# Patient Record
Sex: Female | Born: 1945
Health system: Southern US, Community
[De-identification: ages and names within clinical notes are randomized; demographics above are authoritative.]

## PROBLEM LIST (undated history)

## (undated) DIAGNOSIS — E669 Obesity, unspecified: Secondary | ICD-10-CM

## (undated) DIAGNOSIS — IMO0002 Reserved for concepts with insufficient information to code with codable children: Secondary | ICD-10-CM

## (undated) DIAGNOSIS — R7309 Other abnormal glucose: Secondary | ICD-10-CM

## (undated) DIAGNOSIS — F418 Other specified anxiety disorders: Secondary | ICD-10-CM

## (undated) DIAGNOSIS — I10 Essential (primary) hypertension: Secondary | ICD-10-CM

## (undated) DIAGNOSIS — N390 Urinary tract infection, site not specified: Secondary | ICD-10-CM

## (undated) DIAGNOSIS — R42 Dizziness and giddiness: Secondary | ICD-10-CM

## (undated) DIAGNOSIS — A809 Acute poliomyelitis, unspecified: Secondary | ICD-10-CM

## (undated) DIAGNOSIS — R3915 Urgency of urination: Secondary | ICD-10-CM

## (undated) DIAGNOSIS — Z923 Personal history of irradiation: Secondary | ICD-10-CM

## (undated) DIAGNOSIS — M19049 Primary osteoarthritis, unspecified hand: Secondary | ICD-10-CM

## (undated) DIAGNOSIS — H9190 Unspecified hearing loss, unspecified ear: Secondary | ICD-10-CM

## (undated) DIAGNOSIS — L259 Unspecified contact dermatitis, unspecified cause: Secondary | ICD-10-CM

## (undated) DIAGNOSIS — I350 Nonrheumatic aortic (valve) stenosis: Principal | ICD-10-CM

## (undated) DIAGNOSIS — Z860100 Personal history of colon polyps, unspecified: Secondary | ICD-10-CM

## (undated) DIAGNOSIS — K219 Gastro-esophageal reflux disease without esophagitis: Secondary | ICD-10-CM

## (undated) DIAGNOSIS — Z952 Presence of prosthetic heart valve: Secondary | ICD-10-CM

## (undated) DIAGNOSIS — R609 Edema, unspecified: Secondary | ICD-10-CM

## (undated) DIAGNOSIS — J019 Acute sinusitis, unspecified: Secondary | ICD-10-CM

## (undated) DIAGNOSIS — F329 Major depressive disorder, single episode, unspecified: Secondary | ICD-10-CM

## (undated) DIAGNOSIS — K5901 Slow transit constipation: Secondary | ICD-10-CM

## (undated) DIAGNOSIS — M797 Fibromyalgia: Secondary | ICD-10-CM

## (undated) DIAGNOSIS — K08109 Complete loss of teeth, unspecified cause, unspecified class: Secondary | ICD-10-CM

## (undated) DIAGNOSIS — J309 Allergic rhinitis, unspecified: Secondary | ICD-10-CM

## (undated) DIAGNOSIS — K589 Irritable bowel syndrome without diarrhea: Secondary | ICD-10-CM

## (undated) DIAGNOSIS — S2000XA Contusion of breast, unspecified breast, initial encounter: Secondary | ICD-10-CM

## (undated) DIAGNOSIS — M8580 Other specified disorders of bone density and structure, unspecified site: Secondary | ICD-10-CM

## (undated) DIAGNOSIS — E876 Hypokalemia: Secondary | ICD-10-CM

## (undated) DIAGNOSIS — R011 Cardiac murmur, unspecified: Secondary | ICD-10-CM

## (undated) DIAGNOSIS — C541 Malignant neoplasm of endometrium: Secondary | ICD-10-CM

## (undated) DIAGNOSIS — N318 Other neuromuscular dysfunction of bladder: Secondary | ICD-10-CM

## (undated) DIAGNOSIS — C4490 Unspecified malignant neoplasm of skin, unspecified: Secondary | ICD-10-CM

## (undated) DIAGNOSIS — Z974 Presence of external hearing-aid: Secondary | ICD-10-CM

## (undated) DIAGNOSIS — M549 Dorsalgia, unspecified: Secondary | ICD-10-CM

## (undated) DIAGNOSIS — Z8371 Family history of colonic polyps: Secondary | ICD-10-CM

## (undated) DIAGNOSIS — Z800 Family history of malignant neoplasm of digestive organs: Secondary | ICD-10-CM

## (undated) DIAGNOSIS — R300 Dysuria: Secondary | ICD-10-CM

## (undated) DIAGNOSIS — M1990 Unspecified osteoarthritis, unspecified site: Secondary | ICD-10-CM

## (undated) DIAGNOSIS — R2689 Other abnormalities of gait and mobility: Secondary | ICD-10-CM

## (undated) DIAGNOSIS — L219 Seborrheic dermatitis, unspecified: Secondary | ICD-10-CM

## (undated) DIAGNOSIS — Z972 Presence of dental prosthetic device (complete) (partial): Secondary | ICD-10-CM

## (undated) DIAGNOSIS — E785 Hyperlipidemia, unspecified: Secondary | ICD-10-CM

## (undated) DIAGNOSIS — K6389 Other specified diseases of intestine: Secondary | ICD-10-CM

## (undated) DIAGNOSIS — R32 Unspecified urinary incontinence: Secondary | ICD-10-CM

## (undated) DIAGNOSIS — L299 Pruritus, unspecified: Secondary | ICD-10-CM

## (undated) DIAGNOSIS — Z8612 Personal history of poliomyelitis: Secondary | ICD-10-CM

## (undated) DIAGNOSIS — Z85828 Personal history of other malignant neoplasm of skin: Secondary | ICD-10-CM

## (undated) HISTORY — DX: Family history of malignant neoplasm of digestive organs: Z80.0

## (undated) HISTORY — DX: Family history of colonic polyps: Z83.71

## (undated) HISTORY — PX: UPPER GI ENDOSCOPY: SHX6162

## (undated) HISTORY — DX: Irritable bowel syndrome, unspecified: K58.9

## (undated) HISTORY — DX: Major depressive disorder, single episode, unspecified: F32.9

## (undated) HISTORY — PX: WISDOM TOOTH EXTRACTION: SHX21

## (undated) HISTORY — PX: TUBAL LIGATION: SHX77

## (undated) HISTORY — DX: Essential (primary) hypertension: I10

## (undated) HISTORY — PX: MULTIPLE TOOTH EXTRACTIONS: SHX2053

## (undated) HISTORY — DX: Unspecified osteoarthritis, unspecified site: M19.90

## (undated) HISTORY — DX: Other specified disorders of bone density and structure, unspecified site: M85.80

## (undated) HISTORY — PX: TRANSTHORACIC ECHOCARDIOGRAM: SHX275

## (undated) HISTORY — PX: CARPAL TUNNEL RELEASE: SHX101

## (undated) HISTORY — PX: OTHER SURGICAL HISTORY: SHX169

## (undated) HISTORY — DX: Gastro-esophageal reflux disease without esophagitis: K21.9

## (undated) HISTORY — DX: Cardiac murmur, unspecified: R01.1

## (undated) HISTORY — PX: DILATION AND CURETTAGE OF UTERUS: SHX78

## (undated) HISTORY — DX: Urinary tract infection, site not specified: N39.0

## (undated) HISTORY — DX: Nonrheumatic aortic (valve) stenosis: I35.0

---
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
## 1974-07-09 HISTORY — PX: NASAL SINUS SURGERY: SHX719

## 1996-07-09 HISTORY — PX: KNEE SURGERY: SHX244

## 1997-07-09 HISTORY — PX: THUMB ARTHROSCOPY: SHX2509

## 1997-07-09 HISTORY — PX: HAND SURGERY: SHX662

## 1997-12-31 ENCOUNTER — Other Ambulatory Visit: Admission: RE | Admit: 1997-12-31 | Discharge: 1997-12-31 | Payer: Self-pay | Admitting: Obstetrics and Gynecology

## 1998-02-22 ENCOUNTER — Ambulatory Visit (HOSPITAL_BASED_OUTPATIENT_CLINIC_OR_DEPARTMENT_OTHER): Admission: RE | Admit: 1998-02-22 | Discharge: 1998-02-22 | Payer: Self-pay | Admitting: Orthopedic Surgery

## 1998-11-25 ENCOUNTER — Ambulatory Visit (HOSPITAL_COMMUNITY): Admission: RE | Admit: 1998-11-25 | Discharge: 1998-11-25 | Payer: Self-pay | Admitting: *Deleted

## 1998-11-25 ENCOUNTER — Encounter: Payer: Self-pay | Admitting: *Deleted

## 1999-01-23 ENCOUNTER — Other Ambulatory Visit: Admission: RE | Admit: 1999-01-23 | Discharge: 1999-01-23 | Payer: Self-pay | Admitting: Obstetrics and Gynecology

## 2000-01-23 ENCOUNTER — Other Ambulatory Visit: Admission: RE | Admit: 2000-01-23 | Discharge: 2000-01-23 | Payer: Self-pay | Admitting: Obstetrics and Gynecology

## 2000-04-12 ENCOUNTER — Encounter: Payer: Self-pay | Admitting: Obstetrics and Gynecology

## 2000-04-12 ENCOUNTER — Encounter: Admission: RE | Admit: 2000-04-12 | Discharge: 2000-04-12 | Payer: Self-pay | Admitting: Obstetrics and Gynecology

## 2000-10-01 ENCOUNTER — Encounter: Admission: RE | Admit: 2000-10-01 | Discharge: 2000-10-01 | Payer: Self-pay

## 2001-04-11 ENCOUNTER — Encounter: Admission: RE | Admit: 2001-04-11 | Discharge: 2001-04-11 | Payer: Self-pay | Admitting: Obstetrics and Gynecology

## 2001-04-11 ENCOUNTER — Encounter: Payer: Self-pay | Admitting: Obstetrics and Gynecology

## 2001-05-05 ENCOUNTER — Encounter: Admission: RE | Admit: 2001-05-05 | Discharge: 2001-05-05 | Payer: Self-pay | Admitting: *Deleted

## 2002-04-17 ENCOUNTER — Encounter: Payer: Self-pay | Admitting: Obstetrics and Gynecology

## 2002-04-17 ENCOUNTER — Encounter: Admission: RE | Admit: 2002-04-17 | Discharge: 2002-04-17 | Payer: Self-pay | Admitting: Obstetrics and Gynecology

## 2002-06-19 ENCOUNTER — Encounter: Payer: Self-pay | Admitting: Gastroenterology

## 2003-01-14 ENCOUNTER — Encounter: Payer: Self-pay | Admitting: Emergency Medicine

## 2003-01-14 ENCOUNTER — Emergency Department (HOSPITAL_COMMUNITY): Admission: EM | Admit: 2003-01-14 | Discharge: 2003-01-14 | Payer: Self-pay | Admitting: Emergency Medicine

## 2003-04-09 ENCOUNTER — Encounter: Payer: Self-pay | Admitting: Obstetrics and Gynecology

## 2003-04-09 ENCOUNTER — Encounter: Admission: RE | Admit: 2003-04-09 | Discharge: 2003-04-09 | Payer: Self-pay | Admitting: Obstetrics and Gynecology

## 2003-06-10 ENCOUNTER — Encounter: Admission: RE | Admit: 2003-06-10 | Discharge: 2003-06-10 | Payer: Self-pay | Admitting: Obstetrics and Gynecology

## 2003-07-23 ENCOUNTER — Encounter: Admission: RE | Admit: 2003-07-23 | Discharge: 2003-07-23 | Payer: Self-pay | Admitting: Obstetrics and Gynecology

## 2004-02-01 ENCOUNTER — Other Ambulatory Visit: Admission: RE | Admit: 2004-02-01 | Discharge: 2004-02-01 | Payer: Self-pay | Admitting: Obstetrics and Gynecology

## 2004-04-07 ENCOUNTER — Encounter: Admission: RE | Admit: 2004-04-07 | Discharge: 2004-04-07 | Payer: Self-pay | Admitting: Obstetrics and Gynecology

## 2004-07-06 ENCOUNTER — Ambulatory Visit: Payer: Self-pay | Admitting: Family Medicine

## 2004-07-28 ENCOUNTER — Ambulatory Visit: Payer: Self-pay | Admitting: Family Medicine

## 2004-08-18 ENCOUNTER — Encounter: Admission: RE | Admit: 2004-08-18 | Discharge: 2004-08-18 | Payer: Self-pay | Admitting: Otolaryngology

## 2004-09-01 ENCOUNTER — Ambulatory Visit: Payer: Self-pay | Admitting: Family Medicine

## 2004-10-16 ENCOUNTER — Ambulatory Visit: Payer: Self-pay | Admitting: Family Medicine

## 2004-10-19 ENCOUNTER — Ambulatory Visit: Payer: Self-pay | Admitting: Internal Medicine

## 2004-11-15 ENCOUNTER — Ambulatory Visit: Payer: Self-pay | Admitting: Family Medicine

## 2005-02-01 ENCOUNTER — Other Ambulatory Visit: Admission: RE | Admit: 2005-02-01 | Discharge: 2005-02-01 | Payer: Self-pay | Admitting: Obstetrics and Gynecology

## 2005-04-25 ENCOUNTER — Encounter: Admission: RE | Admit: 2005-04-25 | Discharge: 2005-04-25 | Payer: Self-pay | Admitting: Obstetrics and Gynecology

## 2005-05-04 ENCOUNTER — Ambulatory Visit: Payer: Self-pay | Admitting: Family Medicine

## 2005-07-09 LAB — HM DEXA SCAN

## 2005-08-24 ENCOUNTER — Ambulatory Visit: Payer: Self-pay | Admitting: Family Medicine

## 2005-09-06 ENCOUNTER — Ambulatory Visit: Payer: Self-pay | Admitting: Family Medicine

## 2005-10-24 ENCOUNTER — Ambulatory Visit: Payer: Self-pay | Admitting: Family Medicine

## 2006-03-09 ENCOUNTER — Encounter: Payer: Self-pay | Admitting: Family Medicine

## 2006-03-28 ENCOUNTER — Other Ambulatory Visit: Admission: RE | Admit: 2006-03-28 | Discharge: 2006-03-28 | Payer: Self-pay | Admitting: Obstetrics and Gynecology

## 2006-04-25 ENCOUNTER — Encounter: Admission: RE | Admit: 2006-04-25 | Discharge: 2006-04-25 | Payer: Self-pay | Admitting: Obstetrics and Gynecology

## 2006-08-30 ENCOUNTER — Ambulatory Visit: Payer: Self-pay | Admitting: Family Medicine

## 2006-09-06 ENCOUNTER — Ambulatory Visit: Payer: Self-pay | Admitting: Family Medicine

## 2006-09-06 LAB — CONVERTED CEMR LAB
ALT: 32 units/L (ref 0–40)
AST: 26 units/L (ref 0–37)
Albumin: 4.1 g/dL (ref 3.5–5.2)
Alkaline Phosphatase: 69 units/L (ref 39–117)
BUN: 12 mg/dL (ref 6–23)
Basophils Absolute: 0 10*3/uL (ref 0.0–0.1)
Calcium: 9.2 mg/dL (ref 8.4–10.5)
Chloride: 100 meq/L (ref 96–112)
Cholesterol: 178 mg/dL (ref 0–200)
Creatinine, Ser: 0.8 mg/dL (ref 0.4–1.2)
GFR calc non Af Amer: 78 mL/min
HCT: 40.9 % (ref 36.0–46.0)
LDL Cholesterol: 109 mg/dL — ABNORMAL HIGH (ref 0–99)
MCHC: 35.4 g/dL (ref 30.0–36.0)
Neutrophils Relative %: 69.1 % (ref 43.0–77.0)
Platelets: 253 10*3/uL (ref 150–400)
RBC: 4.64 M/uL (ref 3.87–5.11)
RDW: 12.7 % (ref 11.5–14.6)
Sodium: 136 meq/L (ref 135–145)
Total Bilirubin: 1 mg/dL (ref 0.3–1.2)
Total CHOL/HDL Ratio: 5
Triglycerides: 168 mg/dL — ABNORMAL HIGH (ref 0–149)

## 2006-09-17 ENCOUNTER — Ambulatory Visit: Payer: Self-pay | Admitting: Family Medicine

## 2007-03-27 ENCOUNTER — Other Ambulatory Visit: Admission: RE | Admit: 2007-03-27 | Discharge: 2007-03-27 | Payer: Self-pay | Admitting: Obstetrics and Gynecology

## 2007-04-25 ENCOUNTER — Encounter: Admission: RE | Admit: 2007-04-25 | Discharge: 2007-04-25 | Payer: Self-pay | Admitting: Obstetrics and Gynecology

## 2007-05-02 ENCOUNTER — Encounter: Payer: Self-pay | Admitting: Family Medicine

## 2007-05-05 ENCOUNTER — Encounter (INDEPENDENT_AMBULATORY_CARE_PROVIDER_SITE_OTHER): Payer: Self-pay | Admitting: *Deleted

## 2007-08-26 ENCOUNTER — Encounter: Payer: Self-pay | Admitting: Family Medicine

## 2007-08-26 DIAGNOSIS — R609 Edema, unspecified: Secondary | ICD-10-CM

## 2007-08-26 DIAGNOSIS — M19049 Primary osteoarthritis, unspecified hand: Secondary | ICD-10-CM | POA: Insufficient documentation

## 2007-08-26 DIAGNOSIS — R32 Unspecified urinary incontinence: Secondary | ICD-10-CM | POA: Insufficient documentation

## 2007-08-26 DIAGNOSIS — F32A Depression, unspecified: Secondary | ICD-10-CM

## 2007-08-26 DIAGNOSIS — Z87448 Personal history of other diseases of urinary system: Secondary | ICD-10-CM

## 2007-08-26 DIAGNOSIS — K589 Irritable bowel syndrome without diarrhea: Secondary | ICD-10-CM | POA: Insufficient documentation

## 2007-08-26 DIAGNOSIS — IMO0001 Reserved for inherently not codable concepts without codable children: Secondary | ICD-10-CM

## 2007-08-26 DIAGNOSIS — K219 Gastro-esophageal reflux disease without esophagitis: Secondary | ICD-10-CM

## 2007-08-26 DIAGNOSIS — N318 Other neuromuscular dysfunction of bladder: Secondary | ICD-10-CM

## 2007-08-26 DIAGNOSIS — J309 Allergic rhinitis, unspecified: Secondary | ICD-10-CM | POA: Insufficient documentation

## 2007-08-26 DIAGNOSIS — Z8612 Personal history of poliomyelitis: Secondary | ICD-10-CM

## 2007-08-26 DIAGNOSIS — E669 Obesity, unspecified: Secondary | ICD-10-CM

## 2007-08-26 DIAGNOSIS — R42 Dizziness and giddiness: Secondary | ICD-10-CM | POA: Insufficient documentation

## 2007-08-26 DIAGNOSIS — F329 Major depressive disorder, single episode, unspecified: Secondary | ICD-10-CM

## 2007-08-26 DIAGNOSIS — F419 Anxiety disorder, unspecified: Secondary | ICD-10-CM | POA: Insufficient documentation

## 2007-08-26 HISTORY — DX: Unspecified urinary incontinence: R32

## 2007-08-26 HISTORY — DX: Primary osteoarthritis, unspecified hand: M19.049

## 2007-08-26 HISTORY — DX: Other neuromuscular dysfunction of bladder: N31.8

## 2007-08-26 HISTORY — DX: Gastro-esophageal reflux disease without esophagitis: K21.9

## 2007-08-26 HISTORY — DX: Obesity, unspecified: E66.9

## 2007-08-26 HISTORY — DX: Personal history of poliomyelitis: Z86.12

## 2007-08-26 HISTORY — DX: Depression, unspecified: F32.A

## 2007-08-26 HISTORY — DX: Anxiety disorder, unspecified: F41.9

## 2007-08-29 ENCOUNTER — Ambulatory Visit: Payer: Self-pay | Admitting: Family Medicine

## 2007-09-02 ENCOUNTER — Telehealth: Payer: Self-pay | Admitting: Family Medicine

## 2007-09-03 ENCOUNTER — Encounter: Admission: RE | Admit: 2007-09-03 | Discharge: 2007-09-03 | Payer: Self-pay | Admitting: Family Medicine

## 2007-09-05 ENCOUNTER — Ambulatory Visit: Payer: Self-pay | Admitting: Family Medicine

## 2007-09-05 DIAGNOSIS — M8580 Other specified disorders of bone density and structure, unspecified site: Secondary | ICD-10-CM

## 2007-09-09 LAB — CONVERTED CEMR LAB
ALT: 21 units/L (ref 0–35)
Albumin: 4 g/dL (ref 3.5–5.2)
Alkaline Phosphatase: 59 units/L (ref 39–117)
BUN: 7 mg/dL (ref 6–23)
Basophils Absolute: 0 10*3/uL (ref 0.0–0.1)
Basophils Relative: 0.1 % (ref 0.0–1.0)
CO2: 31 meq/L (ref 19–32)
Calcium: 9.3 mg/dL (ref 8.4–10.5)
Cholesterol: 156 mg/dL (ref 0–200)
Creatinine, Ser: 0.9 mg/dL (ref 0.4–1.2)
GFR calc Af Amer: 82 mL/min
HDL: 32.6 mg/dL — ABNORMAL LOW (ref >39.0)
LDL Cholesterol: 89 mg/dL (ref 0–99)
MCHC: 33.7 g/dL (ref 30.0–36.0)
Monocytes Relative: 3.7 % (ref 3.0–11.0)
Neutro Abs: 3.2 10*3/uL (ref 1.4–7.7)
Platelets: 215 10*3/uL (ref 150–400)
Potassium: 3.6 meq/L (ref 3.5–5.1)
RDW: 12.1 % (ref 11.5–14.6)
Total Protein: 6.7 g/dL (ref 6.0–8.3)
Triglycerides: 172 mg/dL — ABNORMAL HIGH (ref 0–149)
VLDL: 34 mg/dL (ref 0–40)

## 2007-09-10 LAB — CONVERTED CEMR LAB: Vit D, 1,25-Dihydroxy: 40 (ref 30–89)

## 2007-09-26 ENCOUNTER — Telehealth: Payer: Self-pay | Admitting: Family Medicine

## 2008-03-25 ENCOUNTER — Other Ambulatory Visit: Admission: RE | Admit: 2008-03-25 | Discharge: 2008-03-25 | Payer: Self-pay | Admitting: Obstetrics and Gynecology

## 2008-04-23 ENCOUNTER — Encounter (INDEPENDENT_AMBULATORY_CARE_PROVIDER_SITE_OTHER): Payer: Self-pay | Admitting: *Deleted

## 2008-04-23 ENCOUNTER — Encounter: Payer: Self-pay | Admitting: Family Medicine

## 2008-04-23 ENCOUNTER — Encounter: Admission: RE | Admit: 2008-04-23 | Discharge: 2008-04-23 | Payer: Self-pay | Admitting: Obstetrics and Gynecology

## 2008-05-06 ENCOUNTER — Encounter: Payer: Self-pay | Admitting: Family Medicine

## 2008-06-14 ENCOUNTER — Encounter: Payer: Self-pay | Admitting: Family Medicine

## 2008-07-07 ENCOUNTER — Telehealth (INDEPENDENT_AMBULATORY_CARE_PROVIDER_SITE_OTHER): Payer: Self-pay | Admitting: *Deleted

## 2008-08-12 ENCOUNTER — Ambulatory Visit: Payer: Self-pay | Admitting: Family Medicine

## 2008-08-12 LAB — CONVERTED CEMR LAB
Glucose, Urine, Semiquant: NEGATIVE
Ketones, urine, test strip: NEGATIVE
Protein, U semiquant: NEGATIVE
RBC / HPF: 2
Specific Gravity, Urine: 1.025
pH: 6

## 2008-08-16 LAB — CONVERTED CEMR LAB
AST: 28 units/L (ref 0–37)
Alkaline Phosphatase: 54 units/L (ref 39–117)
Bilirubin, Direct: 0.1 mg/dL (ref 0.0–0.3)
Chloride: 109 meq/L (ref 96–112)
Eosinophils Relative: 2.8 % (ref 0.0–5.0)
GFR calc Af Amer: 109 mL/min
GFR calc non Af Amer: 90 mL/min
Glucose, Bld: 96 mg/dL (ref 70–99)
HCT: 39.8 % (ref 36.0–46.0)
HDL: 37 mg/dL — ABNORMAL LOW (ref >39.0)
LDL Cholesterol: 109 mg/dL — ABNORMAL HIGH (ref 0–99)
Lymphocytes Relative: 27.9 % (ref 12.0–46.0)
Monocytes Absolute: 0.2 10*3/uL (ref 0.1–1.0)
Monocytes Relative: 4.1 % (ref 3.0–12.0)
Neutrophils Relative %: 65.1 % (ref 43.0–77.0)
Platelets: 224 10*3/uL (ref 150–400)
Potassium: 3.9 meq/L (ref 3.5–5.1)
RDW: 12.2 % (ref 11.5–14.6)
Sodium: 141 meq/L (ref 135–145)
Total CHOL/HDL Ratio: 4.8
Total Protein: 7.1 g/dL (ref 6.0–8.3)
Triglycerides: 154 mg/dL — ABNORMAL HIGH (ref 0–149)
VLDL: 31 mg/dL (ref 0–40)
WBC: 5 10*3/uL (ref 4.5–10.5)

## 2008-09-08 ENCOUNTER — Telehealth: Payer: Self-pay | Admitting: Family Medicine

## 2008-10-27 ENCOUNTER — Encounter: Payer: Self-pay | Admitting: Family Medicine

## 2008-11-03 ENCOUNTER — Encounter: Payer: Self-pay | Admitting: Family Medicine

## 2009-03-23 ENCOUNTER — Other Ambulatory Visit: Admission: RE | Admit: 2009-03-23 | Discharge: 2009-03-23 | Payer: Self-pay | Admitting: Obstetrics and Gynecology

## 2009-04-08 ENCOUNTER — Encounter: Admission: RE | Admit: 2009-04-08 | Discharge: 2009-04-08 | Payer: Self-pay | Admitting: Obstetrics and Gynecology

## 2009-04-12 ENCOUNTER — Encounter (INDEPENDENT_AMBULATORY_CARE_PROVIDER_SITE_OTHER): Payer: Self-pay | Admitting: *Deleted

## 2009-08-19 ENCOUNTER — Encounter (INDEPENDENT_AMBULATORY_CARE_PROVIDER_SITE_OTHER): Payer: Self-pay | Admitting: *Deleted

## 2009-08-19 ENCOUNTER — Ambulatory Visit: Payer: Self-pay | Admitting: Family Medicine

## 2009-08-19 DIAGNOSIS — R300 Dysuria: Secondary | ICD-10-CM

## 2009-08-19 LAB — CONVERTED CEMR LAB
Bilirubin Urine: NEGATIVE
Glucose, Urine, Semiquant: NEGATIVE
RBC / HPF: 0
Urobilinogen, UA: 0.2
pH: 6

## 2009-08-23 DIAGNOSIS — R82998 Other abnormal findings in urine: Secondary | ICD-10-CM | POA: Insufficient documentation

## 2009-08-23 LAB — CONVERTED CEMR LAB
Alkaline Phosphatase: 71 units/L (ref 39–117)
Basophils Relative: 0.2 % (ref 0.0–3.0)
Bilirubin, Direct: 0.1 mg/dL (ref 0.0–0.3)
Calcium: 9.2 mg/dL (ref 8.4–10.5)
Creatinine, Ser: 0.8 mg/dL (ref 0.4–1.2)
Eosinophils Absolute: 0.1 10*3/uL (ref 0.0–0.7)
Eosinophils Relative: 2.6 % (ref 0.0–5.0)
GFR calc non Af Amer: 76.63 mL/min (ref >60)
HDL: 42.5 mg/dL (ref >39.00)
LDL Cholesterol: 94 mg/dL (ref 0–99)
Lymphocytes Relative: 21.1 % (ref 12.0–46.0)
MCHC: 34 g/dL (ref 30.0–36.0)
Neutrophils Relative %: 71.1 % (ref 43.0–77.0)
RBC: 4.2 M/uL (ref 3.87–5.11)
Total CHOL/HDL Ratio: 4
Total Protein: 6.9 g/dL (ref 6.0–8.3)
Triglycerides: 149 mg/dL (ref 0.0–149.0)
VLDL: 29.8 mg/dL (ref 0.0–40.0)
WBC: 4.7 10*3/uL (ref 4.5–10.5)

## 2009-09-16 ENCOUNTER — Ambulatory Visit: Payer: Self-pay | Admitting: Gastroenterology

## 2009-09-16 ENCOUNTER — Encounter (INDEPENDENT_AMBULATORY_CARE_PROVIDER_SITE_OTHER): Payer: Self-pay | Admitting: *Deleted

## 2009-09-16 LAB — CONVERTED CEMR LAB
Iron: 62 ug/dL (ref 42–145)
Tissue Transglutaminase Ab, IgA: 0.3 units (ref <7)
Transferrin: 255.7 mg/dL (ref 212.0–360.0)
Vitamin B-12: 410 pg/mL (ref 211–911)

## 2009-09-23 ENCOUNTER — Encounter: Payer: Self-pay | Admitting: Family Medicine

## 2009-09-26 ENCOUNTER — Telehealth: Payer: Self-pay | Admitting: Gastroenterology

## 2009-09-27 ENCOUNTER — Telehealth: Payer: Self-pay | Admitting: Family Medicine

## 2009-10-14 ENCOUNTER — Telehealth: Payer: Self-pay | Admitting: Gastroenterology

## 2009-10-21 ENCOUNTER — Ambulatory Visit: Payer: Self-pay | Admitting: Gastroenterology

## 2009-10-21 ENCOUNTER — Encounter (INDEPENDENT_AMBULATORY_CARE_PROVIDER_SITE_OTHER): Payer: Self-pay | Admitting: *Deleted

## 2009-10-26 ENCOUNTER — Encounter: Payer: Self-pay | Admitting: Gastroenterology

## 2009-11-01 ENCOUNTER — Telehealth: Payer: Self-pay | Admitting: Gastroenterology

## 2009-12-09 ENCOUNTER — Telehealth: Payer: Self-pay | Admitting: Family Medicine

## 2010-01-16 ENCOUNTER — Other Ambulatory Visit: Admission: RE | Admit: 2010-01-16 | Discharge: 2010-01-16 | Payer: Self-pay | Admitting: Obstetrics and Gynecology

## 2010-01-23 ENCOUNTER — Telehealth: Payer: Self-pay | Admitting: Family Medicine

## 2010-04-12 LAB — HM MAMMOGRAPHY: HM Mammogram: NORMAL

## 2010-04-19 ENCOUNTER — Encounter: Admission: RE | Admit: 2010-04-19 | Discharge: 2010-04-19 | Payer: Self-pay | Admitting: Obstetrics and Gynecology

## 2010-05-16 ENCOUNTER — Telehealth: Payer: Self-pay | Admitting: Family Medicine

## 2010-08-08 NOTE — Letter (Signed)
Summary: Patient Notice- Polyp Results  [facility]  [address] [place], [place] [address]   Phone: [phone]  Fax: [phone]        October 26, 2009 MRN: [id number]    [facility] [address] [place], [place]  [address]    Dear Veronica Greene,  I am pleased to inform you that the colon polyp(s) removed during your recent colonoscopy was (were) found to be benign (no cancer detected) upon pathologic examination.  I recommend you have a repeat colonoscopy examination in 1_ years to look for recurrent polyps, as having colon polyps increases your risk for having recurrent polyps or even colon cancer in the future.  Should you develop new or worsening symptoms of abdominal pain, bowel habit changes or bleeding from the rectum or bowels, please schedule an evaluation with either your primary care physician or with me.  Additional information/recommendations:  __ No further action with gastroenterology is needed at this time. Please      follow-up with your primary care physician for your other healthcare      needs.  __ Please call [phone] to schedule a return visit to review your      situation.  __ Please keep your follow-up visit as already scheduled.  _x_ Continue treatment plan as outlined the day of your exam.  Please call us if you are having persistent problems or have questions about your condition that have not been fully answered at this time.  Sincerely,  Veronica Layman MD [facility]  This letter has been electronically signed by your physician.  Appended Document: Patient Notice- Polyp Results letter mailed 4.25.11

## 2010-08-08 NOTE — Procedures (Signed)
Summary: Upper Endoscopy  Patient: Veronica Greene Note: All result statuses are Final unless otherwise noted.  Tests: (1) Upper Endoscopy (EGD)   EGD Upper Endoscopy       DONE     [facility]     [address]     [place], [place]  [address]           ENDOSCOPY PROCEDURE REPORT           PATIENT:  Veronica, Greene  MR#:  [id number]     BIRTHDATE:  [birth year], 63 yrs. old  GENDER:  female           ENDOSCOPIST:  Vania Rea. Jarold Motto, MD, [facility]     Referred by:           PROCEDURE DATE:  10/21/2009     PROCEDURE:  EGD with biopsy     ASA CLASS:  Class II     INDICATIONS:  dyspepsia           MEDICATIONS:   Fentanyl 25 mcg IV, Versed 3 mg IV, There was     residual sedation effect present from prior procedure.,     glycopyrrolate (Robinal) 0.2 mg IV     TOPICAL ANESTHETIC:  Exactacain Spray           DESCRIPTION OF PROCEDURE:   After the risks benefits and     alternatives of the procedure were thoroughly explained, informed     consent was obtained.  The LB GIF-H180 [id number] endoscope was     introduced through the mouth and advanced to the second portion of     the duodenum, without limitations.  The instrument was slowly     withdrawn as the mucosa was fully examined.     <<PROCEDUREIMAGES>>           Moderate gastritis was found in the body and the antrum of the     stomach. CLO AND REGULAR BIOPSIES DONE.  Normal duodenal folds     were noted. SI BX. DONE.  Normal GE junction was noted.     Retroflexed views revealed no abnormalities.    The scope was then     withdrawn from the patient and the procedure completed.           COMPLICATIONS:  None           ENDOSCOPIC IMPRESSION:     1) Moderate gastritis in the body and the antrum of the stomach           2) Normal duodenal folds     3) Normal GE junction     1.R/O H.PYLORI     2.R/O CELIAC DISEASE     3.CLINICAL GERD     RECOMMENDATIONS:     1) Await biopsy results     2) Rx CLO if positive     3) Anti-reflux  regimen to be follow     4) continue PPI           REPEAT EXAM:  No           ______________________________     Vania Rea. Jarold Motto, MD, [name]           CC:  [name], MD           n.     Rosalie DoctorVania Rea. Patterson at 10/21/2009 02:19 PM           [name], [phone]  Note: An exclamation mark Marland Kitchen)  indicates a result that was not dispersed into the flowsheet. Document Creation Date: 10/21/2009 2:20 PM _______________________________________________________________________  (1) Order result status: Final Collection or observation date-time: 10/21/2009 14:12 Requested date-time:  Receipt date-time:  Reported date-time:  Referring Physician:   Ordering Physician: Sheryn Bison [phone]) Specimen Source:  Source: Launa Grill Order Number: [id number] Lab site:   Appended Document: Upper Endoscopy     Clinical Lists Changes  Observations: Added new observation of PAST SURG HX: Sinus surgery (1976) Knee surgery x 2 (1998) Thumb surgery (1998) Carpal tunnel release x 2 Surgery for poliio as a child Tonsillectomy Stress fracture right foot (1999) Dexa- osteopenia (04/2001), no change (04/2003), improved 2007 Colonoscopy (1998) Hx of urethral dilation Carotid dopplers- neg (11/2000) Colonoscopy- diverticulosis (06/2002) EGD- HH, GERD (08/2002) Pelvic US- neg (07/2003) EGD gastritis and GERD (4/11) colonoscopy  polyp adenoma  (4/11)  (10/27/2009 9:45)         Past Surgical History:    Sinus surgery (1976)    Knee surgery x 2 (1998)    Thumb surgery (1998)    Carpal tunnel release x 2    Surgery for poliio as a child    Tonsillectomy    Stress fracture right foot (1999)    Dexa- osteopenia (04/2001), no change (04/2003), improved 2007    Colonoscopy (1998)    Hx of urethral dilation    Carotid dopplers- neg (11/2000)    Colonoscopy- diverticulosis (06/2002)    EGD- HH, GERD (08/2002)    Pelvic US- neg (07/2003)    EGD gastritis and GERD  (4/11)    colonoscopy  polyp adenoma  (4/11)

## 2010-08-08 NOTE — Consult Note (Signed)
Summary: [facility]  [facility]   Imported By: Maryln Gottron 09/27/2009 15:40:27  _____________________________________________________________________  External Attachment:    Type:   Image     Comment:   External Document

## 2010-08-08 NOTE — Miscellaneous (Signed)
Summary: rx tramadol  Clinical Lists Changes  Medications: Added new medication of TRAMADOL HCL 50 MG TABS (TRAMADOL HCL) take one every eight hours for pain as needed - Signed Rx of TRAMADOL HCL 50 MG TABS (TRAMADOL HCL) take one every eight hours for pain as needed;  #65 x 3;  Signed;  Entered by: Oda Cogan RN;  Authorized by: Mardella Layman MD [facility];  Method used: [facility] [phone]*, [address], [place], [place], [place]  [id number], Ph: [phone] or [phone], Fax: [phone]    Prescriptions: TRAMADOL HCL 50 MG TABS (TRAMADOL HCL) take one every eight hours for pain as needed  #65 x 3   Entered by:   Oda Cogan RN   Authorized by:   Mardella Layman MD [facility]   Signed by:   Oda Cogan RN on 10/21/2009   Method used:   [facility]        [facility]  [address] [phone]* (retail)       [address]       [place], [place]  [id number]       Ph: [phone] or [phone]       Fax: [phone]   RxID:   [id number]

## 2010-08-08 NOTE — Progress Notes (Signed)
Summary: refill request for effexor  Phone Note Refill Request Message from:  Fax from Pharmacy  Refills Requested: Medication #1:  EFFEXOR XR 75 MG  CP24 take one by mouth daily [facility].  Initial call taken by: Lowella Petties CMA,  September 27, 2009 8:47 AM  Follow-up for Phone Call        form done and in nurse in box  Follow-up by: Judith Part MD,  September 27, 2009 9:03 AM  Additional Follow-up for Phone Call Additional follow up Details #1::        Form faxed. Additional Follow-up by: Lowella Petties CMA,  September 27, 2009 11:27 AM    New/Updated Medications: EFFEXOR XR 75 MG  CP24 (VENLAFAXINE HCL) take one by mouth daily Prescriptions: EFFEXOR XR 75 MG  CP24 (VENLAFAXINE HCL) take one by mouth daily  #90 x 3   Entered and Authorized by:   Judith Part MD   Signed by:   Lowella Petties CMA on 09/27/2009   Method used:   Historical   RxID:   [id number]

## 2010-08-08 NOTE — Procedures (Signed)
Summary: EGD   EGD  Procedure date:  06/19/2002  Findings:      Location: [facility]   Patient Name: Veronica Greene, Veronica Greene MRN:  Procedure Procedures: Panendoscopy (EGD) CPT: 43235.  Personnel: Endoscopist: Vania Rea. Jarold Motto, MD.  Exam Location: Exam performed in Outpatient Clinic. Outpatient  Patient Consent: Procedure, Alternatives, Risks and Benefits discussed, consent obtained, from patient. Consent was obtained by the RN.  Indications  Evaluation of: Positive fecal occult blood test  History  Pre-Exam Physical: Performed Jun 19, 2002  Cardio-pulmonary exam, Abdominal exam, Extremity exam, Mental status exam WNL.  Exam Exam Info: Maximum depth of insertion Duodenum, intended Duodenum. Patient position: on left side. Duration of exam: 15 minutes. Vocal cords visualized. Images taken. ASA Classification: I. Tolerance: fair, adequate exam.  Sedation Meds: Patient assessed and found to be appropriate for moderate (conscious) sedation. Cetacaine Spray 2 sprays given aerosolized. Fentanyl 25 mcg. given IV.  Monitoring: BP and pulse monitoring done. Oximetry used. Supplemental O2 given at 2 Liters.  Instrument(s): PFC 160L. Serial [id number].   Findings - HIATAL HERNIA: Prolapsing, 4 cms. in length. ICD9: Hernia, Hiatal: 553.3. - Normal: Proximal Esophagus to Distal Esophagus. Not Seen: Tumor. Barrett's esophagus. Esophageal inflammation. Stricture. Varices.  - Normal: Body to Duodenal 2nd Portion. Ulcer.   Assessment  Diagnoses: 553.3: Hernia, Hiatal. GERD.   Comments: No cause for ++ stool noted. Events  Unplanned Intervention: No unplanned interventions were required.  Plans Instructions: Home hemoccult tests to be obtained, CPT: Hm. Hemoccult. 3 cards dispensed.  Medication(s): Continue current medications. PPI: Omeprazole/Prilosec 20 mg QAM, starting Jun 19, 2002 for indefinitely.   Patient Education: Patient given standard  instructions for: Reflux.  Disposition: After procedure patient sent to recovery. After recovery patient sent home.  Scheduling: Follow-up prn.    cc: Beather Arbour. Thomasena Edis, MD   This report was created from the original endoscopy report, which was reviewed and signed by the above listed endoscopist.

## 2010-08-08 NOTE — Assessment & Plan Note (Signed)
Summary: ABD PAIN/GAS...AS.    History of Present Illness Visit Type: consult  Primary GI MD: Sheryn Bison MD FACP FAGA Primary Provider: [name], MD  Requesting Provider: [name], MD  Chief Complaint: Generalized abd pain, and  bloating   History of Present Illness:   65 year old Caucasian female with generalized abdominal gas, bloating, and constipation improved on Amitiza 24 micrograms twice a day. She has chronic acid reflux meds but taken Nexium 40 mg a day. She currently had regular bowel movements and denies rectal pain, melena or hematochezia. She has had recurrent sinusitis and has been on several courses of broad-spectrum antibiotics. She denies chronic anemia or history of hepatitis or pancreatitis. She does have Profore arthritis and takes daily Mobic, HCTZ, and Effexor XR. Family history is remarkable for colon polyps in her sister and colon cancer in her father. Her last colonoscopy was 8 years ago. She does not smoke or abuse ethanol.   GI Review of Systems    Reports abdominal pain and  bloating.     Location of  Abdominal pain: generalized.    Denies acid reflux, belching, chest pain, dysphagia with liquids, dysphagia with solids, heartburn, loss of appetite, nausea, vomiting, vomiting blood, weight loss, and  weight gain.      Reports irritable bowel syndrome.     Denies anal fissure, black tarry stools, change in bowel habit, constipation, diarrhea, diverticulosis, fecal incontinence, heme positive stool, hemorrhoids, jaundice, light color stool, liver problems, rectal bleeding, and  rectal pain.    Current Medications (verified): 1)  Effexor Xr 75 Mg  Cp24 (Venlafaxine Hcl) .... Take One By Mouth Daily 2)  Nexium 40 Mg  Cpdr (Esomeprazole Magnesium) .... Take One By Mouth Daily 3)  Hydrochlorothiazide 25 Mg  Tabs (Hydrochlorothiazide) .... Take One By Mouth Daily 4)  Caltrate 600+d 600-400 Mg-Unit  Tabs (Calcium Carbonate-Vitamin D) .... Take 1 Tablet By  Mouth Two Times A Day 5)  Mobic 7.5 Mg  Tabs (Meloxicam) .... One By Mouth Daily As Needed With Food 6)  Potassium Chloride Cr 10 Meq  Tbcr (Potassium Chloride) .... Take 2 By Mouth Two Times A Day 7)  Nasonex 50 Mcg/act  Susp (Mometasone Furoate) .... Use As Directed Daily 8)  Amitiza 24 Mcg  Caps (Lubiprostone) .... One By Mouth Two Times A Day 9)  [facility] Vitamin C 1000 Mg  Tabs (Ascorbic Acid) .... Take One By Mouth Daily 10)  Vit D 800 Units .... Take One By Mouth Daily 11)  Multivitamins   Tabs (Multiple Vitamin) .... Take One Daily 12)  Mucinex 600 Mg Xr12h-Tab (Guaifenesin) .... As Needed 13)  Phazyme 125 Mg Chew (Simethicone) .... As Needed  Allergies (verified): 1)  ! Penicillin 2)  ! Biaxin 3)  ! Codeine 4)  Paxil  Past History:  Past medical, surgical, family and social histories (including risk factors) reviewed for relevance to current acute and chronic problems.  Past Medical History: Reviewed history from 08/12/2008 and no changes required. Allergic rhinitis Anxiety Depression Dizziness or vertigo GERD Urinary incontinence osteopenia  early cataracts - no surgery yet 09   GYn- Dr Thomasena Edis  ENT - [facility]  Past Surgical History: Reviewed history from 08/12/2008 and no changes required. Sinus surgery (1976) Knee surgery x 2 (1998) Thumb surgery (1998) Carpal tunnel release x 2 Surgery for poliio as a child Tonsillectomy Stress fracture right foot (1999) Dexa- osteopenia (04/2001), no change (04/2003), improved 2007 Colonoscopy (1998) Hx of urethral dilation Carotid dopplers- neg (  11/2000) Colonoscopy- diverticulosis (06/2002) EGD- HH, GERD (08/2002) Pelvic US- neg (07/2003)  Family History: Reviewed history from 08/19/2009 and no changes required. Father: died age 21- CVA  Mother: hematuria, ALZ, some type of kidney disease  paternal aunt colon cancer GF ETOH Siblings:  sister has hypocalcemia - ? kidney problems   Social History: Reviewed  history from 08/12/2008 and no changes required. 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  The patient denies anemia, anxiety-new, blood in urine, breast changes/lumps, change in vision, confusion, cough, coughing up blood, fainting, fever, headaches-new, hearing problems, heart murmur, heart rhythm changes, itching, menstrual pain, muscle pains/cramps, night sweats, nosebleeds, pregnancy symptoms, shortness of breath, skin rash, sleeping problems, sore throat, swelling of feet/legs, swollen lymph glands, thirst - excessive , urination - excessive , urination changes/pain, urine leakage, vision changes, and voice change.    Vital Signs:  Patient profile:   65 year old female Height:      65 inches Weight:      208 pounds BMI:     34.74 BSA:     2.01 Pulse rate:   72 / minute Pulse rhythm:   regular BP sitting:   128 / 76  (left arm) Cuff size:   regular  Vitals Entered By: Ok Anis CMA (September 16, 2009 10:08 AM)  Physical Exam  General:  Well developed, well nourished, no acute distress.healthy appearing.  healthy appearing.   Head:  Normocephalic and atraumatic. Eyes:  PERRLA, no icterus.exam deferred to patient's ophthalmologist.  exam deferred to patient's ophthalmologist.   Neck:  Supple; no masses or thyromegaly. Lungs:  Clear throughout to auscultation. Heart:  Regular rate and rhythm; no murmurs, rubs,  or bruits. Abdomen:  Soft, nontender and nondistended. No masses, hepatosplenomegaly or hernias noted. Normal bowel sounds. Rectal:  deferred until time of colonoscopy.  deferred until time of colonoscopy.   Msk:  Symmetrical with no gross deformities. Normal posture. Extremities:  No clubbing, cyanosis, edema or deformities noted.There were some red papular lesions on her anterior  shins bilaterally but no evidence of edema or phlebitis or joint swelling. Neurologic:  Alert and  oriented x4;  grossly normal neurologically. Skin:  small red papules on anterior shins Cervical Nodes:  No significant cervical adenopathy. Psych:  Alert and cooperative. Normal mood and affect.   Impression & Recommendations:  Problem # 1:  IBS (ICD-564.1) Assessment Improved Continue Amitiza 24 micrograms twice a day and will add Florstar probiotic therapy. We will check anemia profile and celiac serology with outpatient colonoscopy followup. Orders: TLB-B12, Serum-Total ONLY (04540-J81) TLB-Ferritin (82728-FER) TLB-Folic Acid (Folate) (82746-FOL) TLB-IBC Pnl (Iron/FE;Transferrin) (83550-IBC) TLB-IgA (Immunoglobulin A) (82784-IGA) T-Sprue Panel (Celiac Disease Aby Eval) (83516x3/[address])  Problem # 2:  OVERACTIVE BLADDER (ICD-596.51) Assessment: Unchanged urologic appointment with Dr.Daulstedt scheduled next week  Problem # 3:  GERD (ICD-530.81) Assessment: Improved Continue reflux regime and daily omeprazole therapy. I do not think she needs endoscopic followup at this time. Consider upper abdominal ultrasound exam depending on clinical course  Patient Instructions: 1)  Copy sent to : Dr. Milinda Antis primary care 2)  Please continue current medications.  3)  Constipation and Hemorrhoids brochure given.  4)  Colonoscopy and Flexible Sigmoidoscopy brochure given.  5)  Conscious Sedation brochure given.  6)  Florstar daily 7)  Labs pending 8)  The medication list was reviewed and reconciled.  All changed / newly prescribed medications were explained.  A complete medication list was provided to the patient / caregiver.  Appended Document: ABD PAIN/GAS...AS. Pt to call back after checking with urologist re taking osmprep to have rx called in.   Clinical Lists Changes  Medications: Added new medication of FLORASTOR 250 MG CAPS (SACCHAROMYCES BOULARDII) daily Orders: Added new  Test order of Colonoscopy (Colon) - Signed      Appended Document: ABD PAIN/GAS...AS.    Clinical Lists Changes  Orders: Added new Test order of EGD (EGD) - Signed

## 2010-08-08 NOTE — Progress Notes (Signed)
Summary: results request   Phone Note Call from Patient Call back at Work Phone [phone]   Caller: Patient Call For: Dr. Jarold Motto Reason for Call: Lab or Test Results Summary of Call: would like results from [facility]... informed pt that results letters were mailed out to her on the 20th... pt said she has not checked her mail Initial call taken by: Vallarie Mare,  November 01, 2009 11:12 AM  Follow-up for Phone Call        Pt notified.  Should receive letter in next day or so,. Follow-up by: Ashok Cordia RN,  November 01, 2009 11:46 AM

## 2010-08-08 NOTE — Progress Notes (Signed)
Summary: something for sea sickness   Phone Note Call from Patient Call back at Home Phone [phone]   Caller: Patient Call For: Judith Part MD Summary of Call: Patient is going on a cruise, she is leaving on the 23rd and it is from wed through sunday. She is asking if she can get something to prevent her from getting sea sick. [facility] [address]  Initial call taken by: Ashley Caviness,  May 16, 2010 1:57 PM  Follow-up for Phone Call        can try transderm scop do not use if she has glaucoma  can cause drowsiness and dry mouth px written on EMR for call in  Follow-up by: Marne Lilliah Tower MD,  May 16, 2010 3:35 PM  Additional Follow-up for Phone Call Additional follow up Details #1::        Medication phoned to pharmacy. Patient Advised.  Additional Follow-up by: Lugene Fuquay CMA (AAMA),  May 16, 2010 4:15 PM    New/Updated Medications: TRANSDERM-SCOP 1.5 MG PT72 (SCOPOLAMINE BASE) apply one patch as directed every 3 days as needed motion sickness Prescriptions: TRANSDERM-SCOP 1.5 MG PT72 (SCOPOLAMINE BASE) apply one patch as directed every 3 days as needed motion sickness  #5 x 0   Entered by:   Lugene Fuquay CMA (AAMA)   Authorized by:   Marne Davanna Tower MD   Signed by:   Lugene Fuquay CMA (AAMA) on 05/16/2010   Method used:   [facility]  [address] [id number]ail[facility]       [address]       [place], [place]  [id number]       Ph: [phone] or [phone]       Fax: [phone]   RxID:   [id number]

## 2010-08-08 NOTE — Letter (Signed)
Summary: Osmoprep Instructions  [name]  [address] [place], [place] [address]   Phone: [phone]  Fax: [phone]       Veronica Greene    Jun 22, 1946    MRN: [id number]        Procedure Day /Date: Friday, 10/21/09     Arrival Time: 12:30      Procedure Time: 1:30     Location of Procedure:                    [name] (4th Floor)     PREPARATION FOR COLONOSCOPY WITH OSMOPREP  Starting 5 days prior to your procedure 10/16/09 do not eat nuts, seeds, popcorn, corn, beans, peas,  salads, or any raw vegetables.  Do not take any fiber supplements (e.g. Metamucil, Citrucel, and Benefiber). _________________________________________________________________________________________________  THE DAY BEFORE YOUR PROCEDURE             DATE: 10/20/09     DAY: Thursday  1.   Drink clear liquids the entire day - NO SOLID FOOD.  Drink at least 64 oz. of fluid during the day to prevent hydration and help the prep work efficiently.    2.   Do not drink anything colored red or purple.  Avoid juices with pulp.  No orange juice.              CLEAR LIQUIDS INCLUDE: Water Jello Ice Popsicles Tea (sugar ok, no milk/cream) Powdered fruit flavored drinks Coffee (sugar ok, no milk/cream) Gatorade Juice: apple, white grape, white cranberry  Lemonade Clear bullion, consomm, broth Carbonated beverages (any kind) Strained chicken noodle soup Hard Candy   3.   Beginning at 5:00 p.m. or 6:00 p.m. the night before your procedure, drink one dose (4 tablets with 8 oz. of any clear liquid) every 15 minutes for a total of 5 doses (20 tablets total).  ___________________________________________________________________________________________________   THE DAY OF YOUR PROCEDURE            DATE: 10/21/09   DAY: Friday  1.   Beginning at 8;30  (5 hours before procedure), drink one dose (4 tablets with 8 oz. of any clear liquid) every 15 minutes for a total of 3  doses (12 tablets).  2.   You may drink clear liquids until 11:30  (2 hours before exam).  Do not drink anything after this time.       MEDICATION INSTRUCTIONS  Unless otherwise instructed, you should take regular prescription medications with a small sip of water as early as possible the morning of your procedure.                     OTHER INSTRUCTIONS  You will need a responsible adult at least 66 years of age to accompany you and drive you home.   This person must remain in the waiting room during your procedure.  Wear loose fitting clothing that is easily removed.  Leave jewelry and other valuables at home.  However, you may wish to bring a book to read or an iPod/MP3 player to listen to music as you wait for your procedure to start.  Remove all body piercing jewelry and leave at home.  Total time from sign-in until discharge is approximately 2-3 hours.  You should go home directly after your procedure and rest.  You can resume normal activities the day after your procedure.  The day of your procedure you should not:  Drive   Make legal decisions   Operate machinery   Drink alcohol   Return to work  You will receive specific instructions about eating, activities and medications before you leave.   The above instructions have been reviewed and explained to me by   _______________________   I fully understand and can verbalize these instructions _____________________________ Date _________

## 2010-08-08 NOTE — Assessment & Plan Note (Signed)
Summary: CPX/CLE   Vital Signs:  Patient profile:   65 year old female Height:      65 inches Weight:      214.75 pounds BMI:     35.87 Temp:     98.1 degrees F oral Pulse rate:   72 / minute Pulse rhythm:   regular BP sitting:   130 / 74  (left arm) Cuff size:   large  Vitals Entered By: Lewanda Rife LPN (Aug 28, 2009 9:48 AM)  History of Present Illness: here for health mt exam and also to rev chronic med problems  has been doing pretty good overall   some sinus problems this season  took levaquin nov for sinus infx , and then 3 courses of avelox since then  sees ENT  she does not think it is helpin - is set up for cx or scan she may need surgery   is having some urine burning and pain  no itching  is driniking enough water   wt is up 2 lb did join wt [employer] -- lost 3 lb first week  does like it    bp ok at 130/74- no problems   last dexa osteopenia imp in 10/09 ca and vit D- is good about that / also her osteobiflex has vit D in it too  last vit D level was 48- normal   colonosc 03 wants to get her colonosc now -- is overdue for 5 y f/u due to fam hx   goes to gyn for her exam in sept  did a pap that was normal    mam normal on oct 1  self exam - no lumps or changes  Td 05  got a flu shot this year - in dec   sister has bad kidney disease -- is working on that , with low calcium      Allergies: 1)  ! Penicillin 2)  ! Biaxin 3)  ! Codeine 4)  Paxil  Past History:  Past Medical History: Last updated: 08/12/2008 Allergic rhinitis Anxiety Depression Dizziness or vertigo GERD Urinary incontinence osteopenia  early cataracts - no surgery yet 09   GYn- Dr Thomasena Edis  ENT - Annalee Genta  Past Surgical History: Last updated: 08/12/2008 Sinus surgery (1976) Knee surgery x 2 (1998) Thumb surgery (1998) Carpal tunnel release x 2 Surgery for poliio as a child Tonsillectomy Stress fracture right foot (1999) Dexa- osteopenia (04/2001), no  change (04/2003), improved 2007 Colonoscopy (1998) Hx of urethral dilation Carotid dopplers- neg (11/2000) Colonoscopy- diverticulosis (06/2002) EGD- HH, GERD (08/2002) Pelvic US- neg (07/2003)  Family History: Last updated: 08-28-2009 Father: died age 46- CVA Mother: hematuria, ALZ, some type of kidney disease  Paunt colon cancer GF ETOH Siblings:  sister has hypocalcemia - ? 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 kidney problems   Review of Systems General:  Denies fatigue, fever, loss of appetite, and malaise. Eyes:  Denies eye irritation, itching, and light sensitivity. ENT:  Complains of nasal congestion, postnasal drainage, and sinus pressure; denies earache and sore throat. CV:  Denies chest pain or discomfort and palpitations. Resp:  Denies cough, shortness of breath, and wheezing. GI:  Denies  abdominal pain, bloody stools, change in bowel habits, indigestion, nausea, and vomiting. GU:  Denies abnormal vaginal bleeding, discharge, dysuria, and urinary frequency. MS:  Denies joint pain and stiffness. Derm:  Denies itching, lesion(s), poor wound healing, and rash. Neuro:  Denies numbness and tingling. Psych:  Denies anxiety and depression. Endo:  Denies cold intolerance, excessive thirst, excessive urination, and heat intolerance. Heme:  Denies abnormal bruising and bleeding.  Physical Exam  General:  overweight but generally well appearing  Head:  normocephalic, atraumatic, and no abnormalities observed.   Eyes:  vision grossly intact, pupils equal, pupils round, and pupils reactive to light.   Ears:  R ear normal and L  ear normal.   Nose:  nares injected andmildly congested  Mouth:  pharynx pink and moist.   Neck:  supple with full rom and no masses or thyromegally, no JVD or carotid bruit  Chest Wall:  No deformities, masses, or tenderness noted. Lungs:  Normal respiratory effort, chest expands symmetrically. Lungs are clear to auscultation, no crackles or wheezes. Heart:  Normal rate and regular rhythm. S1 and S2 normal without gallop, murmur, click, rub or other extra sounds. Abdomen:  Bowel sounds positive,abdomen soft and non-tender without masses, organomegaly or hernias noted. no renal bruits  Msk:  No deformity or scoliosis noted of thoracic or lumbar spine.  no acute joint changes  Pulses:  R and L carotid,radial,femoral,dorsalis pedis and posterior tibial pulses are full and equal bilaterally Extremities:  No clubbing, cyanosis, edema, or deformity noted with normal full range of motion of all joints.   Neurologic:  sensation intact to light touch, gait normal, and DTRs symmetrical and normal.   Skin:  Intact without suspicious lesions or rashes Cervical Nodes:  No lymphadenopathy noted Inguinal Nodes:  No significant adenopathy Psych:  normal affect, talkative and pleasant    Impression & Recommendations:  Problem # 1:  HEALTH MAINTENANCE EXAM (ICD-V70.0) Assessment Comment Only reviewed health habits including diet, exercise and skin cancer prevention reviewed health maintenance list and family history disc imp of wt loss  Orders: Venipuncture (16109) TLB-Lipid Panel (80061-LIPID) TLB-BMP (Basic Metabolic Panel-BMET) (80048-METABOL) TLB-CBC Platelet - w/Differential (85025-CBCD) TLB-Hepatic/Liver Function Pnl (80076-HEPATIC) TLB-TSH (Thyroid Stimulating Hormone) (84443-TSH) T-Vitamin D (25-Hydroxy) (60454-09811)  Problem # 2:  OSTEOPENIA (ICD-733.90) Assessment: Unchanged imp last check continue ca adn D and exercise  check D level today   Her updated medication list for this  problem includes:    Caltrate 600+d 600-400 Mg-unit Tabs (Calcium carbonate-vitamin d) .Marland Kitchen... Take 1 tablet by mouth two times a day  Orders: Venipuncture (91478) TLB-Lipid Panel (80061-LIPID) TLB-BMP (Basic Metabolic Panel-BMET) (80048-METABOL) TLB-CBC Platelet - w/Differential (85025-CBCD) TLB-Hepatic/Liver Function Pnl (80076-HEPATIC) TLB-TSH (Thyroid Stimulating Hormone) (84443-TSH) T-Vitamin D (25-Hydroxy) (29562-13086)  Problem # 3:  NEOPLASM, MALIGNANT, COLON, FAMILY HX (ICD-V16.0) Assessment: Comment Only sched 5 y colonosc  no clinical changes Orders: Gastroenterology Referral (GI)  Problem # 4:  DYSURIA (ICD-788.1) Assessment: New with fam hx kidney dz and some crystals in urine  urine cx and then advise  Orders: T-Culture, Urine (57846-96295) Specimen Handling (28413) UA Dipstick W/ Micro (manual) (81000)  Complete Medication List: 1)  Effexor Xr 75 Mg Cp24 (Venlafaxine hcl) .... Take one by mouth daily 2)  Nexium 40 Mg Cpdr (Esomeprazole magnesium) .... Take one by mouth daily 3)  Hydrochlorothiazide 25 Mg Tabs (Hydrochlorothiazide) .... Take one by mouth daily 4)  Caltrate 600+d 600-400 Mg-unit Tabs (Calcium carbonate-vitamin d) .... Take 1 tablet by mouth two times a day  5)  Mobic 7.5 Mg Tabs (Meloxicam) .... One by mouth daily as needed with food 6)  Potassium Chloride Cr 10 Meq Tbcr (Potassium chloride) .... Take 2 by mouth two times a day 7)  Nasonex 50 Mcg/act Susp (Mometasone furoate) .... Use as directed daily 8)  Amitiza 24 Mcg Caps (Lubiprostone) .... One by mouth two times a day 9)  Cvs Vitamin C 1000 Mg Tabs (Ascorbic acid) .... Take one by mouth daily 10)  Vit D 800 Units  .... Take one by mouth daily 11)  Multivitamins Tabs (Multiple vitamin) .... Take one daily 12)  Mucinex 600 Mg Xr12h-tab (Guaifenesin) .Marland Kitchen.. 1 by mouth two times a day  Patient Instructions: 1)  labs today including vit D level  2)  will refer you for colonoscopy at check out  3)   keep working on healthy diet and exercise  4)  doing urine culture and will update you Prescriptions: AMITIZA 24 MCG  CAPS (LUBIPROSTONE) one by mouth two times a day  #3 months x 3   Entered and Authorized by:   Judith Part MD   Signed by:   Judith Part MD on 08/19/2009   Method used:   Print then Give to Patient   RxID:   [id number] NASONEX 50 MCG/ACT  SUSP (MOMETASONE FUROATE) use as directed daily  #3 months x 3   Entered and Authorized by:   Judith Part MD   Signed by:   Judith Part MD on 08/19/2009   Method used:   Print then Give to Patient   RxID:   [id number] POTASSIUM CHLORIDE CR 10 MEQ  TBCR (POTASSIUM CHLORIDE) take 2 by mouth two times a day  #3 months x 3   Entered and Authorized by:   Judith Part MD   Signed by:   Judith Part MD on 08/19/2009   Method used:   Print then Give to Patient   RxID:   [id number] MOBIC 7.5 MG  TABS (MELOXICAM) one by mouth daily as needed with food  #90 x 3   Entered and Authorized by:   Judith Part MD   Signed by:   Judith Part MD on 08/19/2009   Method used:   Print then Give to Patient   RxID:   [id number] HYDROCHLOROTHIAZIDE 25 MG  TABS (HYDROCHLOROTHIAZIDE) take one by mouth daily  #90 x 3   Entered and Authorized by:   Judith Part MD   Signed by:   Judith Part MD on 08/19/2009   Method used:   Print then Give to Patient   RxID:   [id number] NEXIUM 40 MG  CPDR (ESOMEPRAZOLE MAGNESIUM) take one by mouth daily  #90 x 3   Entered and Authorized by:   Judith Part MD   Signed by:   Judith Part MD on 08/19/2009   Method used:   Print then Give to Patient   RxID:   [id number] EFFEXOR XR 75 MG  CP24 (VENLAFAXINE HCL) take one by mouth daily  #90 x 3   Entered and Authorized by:   Judith Part MD   Signed by:   Judith Part MD on 08/19/2009   Method used:   Print then Give to Patient   RxID:   [id number]   Current Allergies (reviewed today): !  PENICILLIN ! BIAXIN ! CODEINE PAXIL  Laboratory Results   Urine Tests  Date/Time Received: August 19, 2009 9:50 AM  Date/Time Reported: August 19, 2009 9:50 AM   Routine Urinalysis   Color: yellow Appearance: Cloudy Glucose: negative   (Normal Range: Negative) Bilirubin: negative   (Normal Range: Negative) Ketone: negative   (Normal Range: Negative) Spec. Gravity: 1.015   (Normal Range: 1.003-1.035) Blood: trace-intact   (Normal Range: Negative) pH: 6.0   (Normal Range: 5.0-8.0) Protein: trace   (Normal Range: Negative) Urobilinogen: 0.2   (Normal Range: 0-1) Nitrite: negative   (Normal Range: Negative) Leukocyte Esterace: negative   (Normal Range: Negative)  Urine Microscopic WBC/HPF: 1-3 RBC/HPF: 0 Bacteria/HPF: mild Mucous/HPF: few Epithelial/HPF: 1-4 Crystals/HPF: many - resemble cystine crystals  Casts/LPF: 0 Yeast/HPF: 0 Other: 0         Preventive Care Screening  Mammogram:    Date:  04/08/2009    Results:  normal   Pap Smear:    Date:  03/09/2009    Results:  normal       Influenza Immunization History:    Influenza # 1:  Fluvax 3+ (06/30/2009)

## 2010-08-08 NOTE — Progress Notes (Signed)
Summary: doesnt feel well  Phone Note Call from Patient Call back at Home Phone [phone] Call back at [phone]   Caller: Patient Call For: Judith Part MD Summary of Call: Pt stopped effexor early in week and has not felt well since.  She has felt light headed and dizzy.  She has started back on effexor and thinks that being off of if for a couple of days is why she feels this way.  She had also stopped hctz last week for a couple of days because she didnt think she needed to be on it, and her blood pressure has been up some this week, but she restarted that as well.  She just doesnt feel well and is asking if she can have some meclizine called to [facility]. Initial call taken by: Lowella Petties CMA,  December 09, 2009 12:22 PM  Follow-up for Phone Call        being off effexor is more than likely the culprit -- a few more days should have her feeling back to normal  I do not think meclizine would help - that is more for inner ear/ vertigo type dizziness f/u next week if not feeling better  Follow-up by: Judith Part MD,  December 09, 2009 2:05 PM  Additional Follow-up for Phone Call Additional follow up Details #1::        Patient notified as instructed by telephone. Lewanda Rife LPN  December 10, 1322 3:51 PM

## 2010-08-08 NOTE — Procedures (Signed)
Summary: Colon   Colonoscopy  Procedure date:  06/19/2002  Findings:      Location:  [facility].   Patient Name: Veronica Greene, Veronica Greene MRN:  Procedure Procedures: Colonoscopy CPT: [phone].  Personnel: Endoscopist: Vania Rea. Jarold Motto, MD.  Exam Location: Exam performed in Outpatient Clinic. Outpatient  Patient Consent: Procedure, Alternatives, Risks and Benefits discussed, consent obtained, from patient. Consent was obtained by the RN.  Indications  Evaluation of: Positive fecal occult blood test per home screening.  History  Pre-Exam Physical: Performed Jun 19, 2002. Cardio-pulmonary exam, Rectal exam, Abdominal exam, Extremity exam, Mental status exam WNL.  Exam Exam: Extent of exam reached: Cecum, extent intended: Cecum.  The cecum was identified by appendiceal orifice and IC valve. Patient position: on left side. Duration of exam: 25 minutes. Colon retroflexion performed. Images taken. ASA Classification: I. Tolerance: excellent.  Monitoring: Pulse and BP monitoring, Oximetry used. Supplemental O2 given.  Colon Prep Used Visicol for colon prep. Prep results: excellent.  Sedation Meds: Patient assessed and found to be appropriate for moderate (conscious) sedation. Fentanyl 125 mcg. given IV. Versed 10 mg. given IV.  Instrument(s): PCF 140L. Serial [id number].  Findings - DIVERTICULOSIS: Ascending Colon to Sigmoid Colon. Not bleeding. ICD9: Diverticulosis, Colon: 562.10.    Comments: No cause for +++ stools noted. Assessment  Diagnoses: 562.10: Diverticulosis, Colon.   Events  Unplanned Interventions: No intervention was required.  Plans Medication Plan: Continue current medications.  Patient Education: Patient given standard instructions for: Diverticulosis.  Disposition: After procedure patient sent to recovery. After recovery patient sent home.  Scheduling/Referral: EGD, to [facility]. Jarold Motto, MD, around Jun 19, 2002.    cc: Beather Arbour. Thomasena Edis, MD    This report was created from the original endoscopy report, which was reviewed and signed by the above listed endoscopist.

## 2010-08-08 NOTE — Miscellaneous (Signed)
Summary: OSP consent/[facility]  OSP consent/[facility]   Imported By: Lester Carthage 09/20/2009 08:12:24  _____________________________________________________________________  External Attachment:    Type:   Image     Comment:   External Document

## 2010-08-08 NOTE — Progress Notes (Signed)
Summary: speak to nurse   Phone Note Call from Patient Call back at Home Phone [phone]   Caller: Patient Call For: Jarold Motto Reason for Call: Talk to Nurse Summary of Call: Patient wants to speak to nurse regarding her procedure next week Initial call taken by: Tawni Levy,  October 14, 2009 9:08 AM  Follow-up for Phone Call        Pt asks about having egd along with her colon next week.  She is having some upper abd pain but no problems with acid reflux.  Last time she had both done at the same time. Follow-up by: Ashok Cordia RN,  October 14, 2009 9:48 AM  Additional Follow-up for Phone Call Additional follow up Details #1::        ok Additional Follow-up by: Mardella Layman MD FACG,  October 17, 2009 8:24 AM    Additional Follow-up for Phone Call Additional follow up Details #2::    Lm for pt to call.  Appt changed in IDX.   Lupita Leash Surface RN  October 17, 2009 8:55 AM  Lm for pt to call. Lupita Leash Surface RN  October 17, 2009 3:16 PM  Pt notified.  [facility] notified of change in schedule. Follow-up by: Ashok Cordia RN,  October 18, 2009 9:04 AM

## 2010-08-08 NOTE — Letter (Signed)
Summary: Patient [facility] Biopsy Results  [facility]  [address] [place], [place] [address]   Phone: [phone]  Fax: [phone]        October 26, 2009 MRN: [id number]    [facility] [address] [place], [place]  [address]    Dear Ms. [facility],  I am pleased to inform you that the biopsies taken during your recent endoscopic examination did not show any evidence of cancer upon pathologic examination.  Additional information/recommendations:  __No further action is needed at this time.  Please follow-up with      your primary care physician for your other healthcare needs.  __ Please call [phone] to schedule a return visit to review      your condition.  x__ Continue with the treatment plan as outlined on the day of your      exam.  __ You should have a repeat endoscopic examination for this problem              in _ months/years.   Please call us if you are having persistent problems or have questions about your condition that have not been fully answered at this time.  Sincerely,  Mardella Layman MD [facility]  This letter has been electronically signed by your physician.  Appended Document: Patient Notice-Endo Biopsy Results letter mailed 4.25.11

## 2010-08-08 NOTE — Procedures (Signed)
Summary: Colonoscopy  Patient: [name] Note: All result statuses are Final unless otherwise noted.  Tests: (1) Colonoscopy (COL)   COL Colonoscopy           DONE     [facility]     [address]     [place], [place]  [address]           COLONOSCOPY PROCEDURE REPORT           PATIENT:  Veronica Greene, Veronica Greene  MR#:  [id number]     BIRTHDATE:  1946-05-28, 63 yrs. old  GENDER:  female     ENDOSCOPIST:  Vania Rea. Jarold Motto, MD, [facility]     REF. BY:     PROCEDURE DATE:  10/21/2009     PROCEDURE:  Colonoscopy with snare polypectomy     ASA CLASS:  Class II     INDICATIONS:  Routine Risk Screening     MEDICATIONS:   Fentanyl 75 mcg IV, Versed 7 mg IV           DESCRIPTION OF PROCEDURE:   After the risks benefits and     alternatives of the procedure were thoroughly explained, informed     consent was obtained.  Digital rectal exam was performed and     revealed no abnormalities.   The LB [id number] endoscope     was introduced through the anus and advanced to the cecum, which     was identified by both the appendix and ileocecal valve, without     limitations.  The quality of the prep was adequate, using     MoviPrep.  The instrument was then slowly withdrawn as the colon     was fully examined.     <<PROCEDUREIMAGES>>           FINDINGS:  ULTRASONIC FINDINGS:  A sessile polyp was found. -3 cm.     flat cecal polyp piecemeal excised with cautery and adjacent polyp     also removed.marked MELANOSIS COLI NOTED. This was otherwise a     normal examination of the colon.   Retroflexed views in the rectum     revealed no abnormalities.    The scope was then withdrawn from     the patient and the procedure completed.           COMPLICATIONS:  None     ENDOSCOPIC IMPRESSION:     1) Sessile polyp     2) Otherwise normal examination     R/O ADENOMA AND ATYPIA.     RECOMMENDATIONS:     1) Await pathology results     REPEAT EXAM:  No           ______________________________  Vania Rea. Jarold Motto, MD, [name]           CC:  [name], MD           n.     Rosalie DoctorVania Rea. Abdur Hoglund at 10/21/2009 02:05 PM           [name], [phone]  Note: An exclamation mark (!) indicates a result that was not dispersed into the flowsheet. Document Creation Date: 10/21/2009 2:05 PM _______________________________________________________________________  (1) Order result status: Final Collection or observation date-time: 10/21/2009 13:58 Requested date-time:  Receipt date-time:  Reported date-time:  Referring Physician:   Ordering Physician: Sheryn Bison [phone]) Specimen Source:  Source: Launa Grill Order Number: [id number] Lab site:   Appended Document: Colonoscopy     Procedures  Next Due Date:    Colonoscopy: 10/2010

## 2010-08-08 NOTE — Progress Notes (Signed)
Summary: Moviprep  Medications Added OSMOPREP 1.102-0.398 GM  TABS (SOD PHOS MONO-SOD PHOS DIBASIC) As per prep instructions.       Phone Note Call from Patient Call back at Work Phone [phone]   Caller: Patient Call For: Dr. Jarold Motto Reason for Call: Talk to Nurse Summary of Call: Pt. is needing her Moviprep called in for her colonoscopy.  Initial call taken by: Karna Christmas,  September 26, 2009 4:49 PM  Follow-up for Phone Call        Pt would like rx for osmoprep [facility] [address].   Follow-up by: Ashok Cordia RN,  September 27, 2009 8:46 AM    New/Updated Medications: OSMOPREP 1.102-0.398 GM  TABS (SOD PHOS MONO-SOD PHOS DIBASIC) As per prep instructions. Prescriptions: OSMOPREP 1.102-0.398 GM  TABS (SOD PHOS MONO-SOD PHOS DIBASIC) As per prep instructions.  #32 x 0   Entered by:   Ashok Cordia RN   Authorized by:   Mardella Layman MD [facility]   Signed by:   Ashok Cordia RN on 09/27/2009   Method used:   [facility]  [address] [phone]* (retail)       [address]       [place], [place]  [id number]       Ph: [phone] or [phone]       Fax: [phone]   RxID:   [id number]

## 2010-08-08 NOTE — Miscellaneous (Signed)
Summary: clotest  Clinical Lists Changes  Orders: Added new Test order of TLB-H Pylori Screen Gastric Biopsy (83013-CLOTEST) - Signed 

## 2010-08-08 NOTE — Progress Notes (Signed)
Summary: Pharmacy call..  Phone Note Outgoing Call   Call placed by: Daine Gip,  January 23, 2010 4:06 PM Summary of Call: Pharmacy Call: Tenna Child called would like to know if genric medication for Effexor xr would be ok to continue. Call back # [phone] Reference # [id number]... Called pt to confirm if Genric was ok, no ans.Daine Gip  January 23, 2010 4:06 PM  Initial call taken by: Daine Gip,  January 23, 2010 4:06 PM  Follow-up for Phone Call        I recommend going back on whatever she was prevoiusly on  let me know if she needs px  Follow-up by: Judith Part MD,  January 23, 2010 4:19 PM  Additional Follow-up for Phone Call Additional follow up Details #1::        I spoke with pt and she said she does not do as well on generic. I called [phone] spoke with Manju. I explained Dr. Milinda Antis wanted pt to have what she had previously taken. Pt and Dr Milinda Antis request name brand. Lewanda Rife LPN  January 23, 2010 4:46 PM

## 2010-08-08 NOTE — Letter (Signed)
Summary: New Patient letter  [facility]  [address] [place], [place] [address]   Phone: [phone]  Fax: [phone]       08/19/2009 MRN: [id number]  [facility] [address] [place], [place]  [address]  Dear Ms. [facility],  Welcome to the Gastroenterology Division at [facility].    You are scheduled to see Dr. Jarold Motto on 09/16/2009 at 0:00AM on the 3rd floor at [facility], [address]  We ask that you try to arrive at our office 15 minutes prior to your appointment time to allow for check-in.  We would like you to complete the enclosed self-administered evaluation form prior to your visit and bring it with you on the day of your appointment.  We will review it with you.  Also, please bring a complete list of all your medications or, if you prefer, bring the medication bottles and we will list them.  Please bring your insurance card so that we may make a copy of it.  If your insurance requires a referral to see a specialist, please bring your referral form from your primary care physician.  Co-payments are due at the time of your visit and may be paid by cash, check or credit card.     Your office visit will consist of a consult with your physician (includes a physical exam), any laboratory testing he/she may order, scheduling of any necessary diagnostic testing (e.g. x-ray, ultrasound, CT-scan), and scheduling of a procedure (e.g. Endoscopy, Colonoscopy) if required.  Please allow enough time on your schedule to allow for any/all of these possibilities.    If you cannot keep your appointment, please call [phone] to cancel or reschedule prior to your appointment date.  This allows [place] the opportunity to schedule an appointment for another patient in need of care.  If you do not cancel or reschedule by 5 p.m. the business day prior to your appointment date, you will be charged a $50.00 late cancellation/no-show fee.    Thank you for choosing [facility] for your medical needs.  We appreciate the opportunity to care for you.  Please visit [place] at our website  to learn more about our practice.                     Sincerely,                                                             The Gastroenterology Division

## 2010-08-25 ENCOUNTER — Encounter: Payer: Self-pay | Admitting: Family Medicine

## 2010-08-25 ENCOUNTER — Encounter (INDEPENDENT_AMBULATORY_CARE_PROVIDER_SITE_OTHER): Payer: Medicare Other | Admitting: Family Medicine

## 2010-08-25 ENCOUNTER — Other Ambulatory Visit: Payer: Self-pay | Admitting: Family Medicine

## 2010-08-25 DIAGNOSIS — L259 Unspecified contact dermatitis, unspecified cause: Secondary | ICD-10-CM

## 2010-08-25 DIAGNOSIS — J309 Allergic rhinitis, unspecified: Secondary | ICD-10-CM

## 2010-08-25 DIAGNOSIS — Z13220 Encounter for screening for lipoid disorders: Secondary | ICD-10-CM

## 2010-08-25 DIAGNOSIS — M899 Disorder of bone, unspecified: Secondary | ICD-10-CM

## 2010-08-25 DIAGNOSIS — E663 Overweight: Secondary | ICD-10-CM

## 2010-08-25 DIAGNOSIS — Z23 Encounter for immunization: Secondary | ICD-10-CM

## 2010-08-25 DIAGNOSIS — F329 Major depressive disorder, single episode, unspecified: Secondary | ICD-10-CM

## 2010-08-25 DIAGNOSIS — M109 Gout, unspecified: Secondary | ICD-10-CM | POA: Insufficient documentation

## 2010-08-25 DIAGNOSIS — R609 Edema, unspecified: Secondary | ICD-10-CM

## 2010-08-25 DIAGNOSIS — L219 Seborrheic dermatitis, unspecified: Secondary | ICD-10-CM

## 2010-08-25 DIAGNOSIS — M949 Disorder of cartilage, unspecified: Secondary | ICD-10-CM

## 2010-08-25 HISTORY — DX: Seborrheic dermatitis, unspecified: L21.9

## 2010-08-25 LAB — TSH: TSH: 1.24 u[IU]/mL (ref 0.35–5.50)

## 2010-08-25 LAB — BASIC METABOLIC PANEL
BUN: 18 mg/dL (ref 6–23)
Chloride: 102 mEq/L (ref 96–112)
Glucose, Bld: 89 mg/dL (ref 70–99)
Potassium: 3.8 mEq/L (ref 3.5–5.1)

## 2010-08-25 LAB — HEPATIC FUNCTION PANEL
ALT: 18 U/L (ref 0–35)
AST: 22 U/L (ref 0–37)
Total Bilirubin: 0.7 mg/dL (ref 0.3–1.2)

## 2010-08-25 LAB — LIPID PANEL
Cholesterol: 182 mg/dL (ref 0–200)
LDL Cholesterol: 107 mg/dL — ABNORMAL HIGH (ref 0–99)

## 2010-08-27 LAB — CONVERTED CEMR LAB
Basophils Absolute: 0 10*3/uL (ref 0.0–0.1)
Basophils Relative: 0 % (ref 0–1)
Eosinophils Absolute: 0.1 10*3/uL (ref 0.0–0.7)
Eosinophils Relative: 2 % (ref 0–5)
Hemoglobin: 14 g/dL (ref 12.0–15.0)
MCHC: 34.7 g/dL (ref 30.0–36.0)
MCV: 89 fL (ref 78.0–100.0)
Monocytes Absolute: 0.2 10*3/uL (ref 0.1–1.0)
Neutro Abs: 3.3 10*3/uL (ref 1.7–7.7)
RDW: 13.6 % (ref 11.5–15.5)

## 2010-08-30 ENCOUNTER — Telehealth: Payer: Self-pay | Admitting: Family Medicine

## 2010-08-30 ENCOUNTER — Encounter: Payer: Self-pay | Admitting: Family Medicine

## 2010-09-05 NOTE — Progress Notes (Signed)
Summary: prior Berkley Harvey is needed for [employer] Note From Pharmacy   Caller: [facility] [address]/ [facility] Summary of Call: Prior Berkley Harvey is needed for amitiza, form is on your shelf.            Lowella Petties CMA, AAMA  August 30, 2010 11:51 AM  Initial call taken by: Lowella Petties CMA, AAMA,  August 30, 2010 11:51 AM  Follow-up for Phone Call        form done and in nurse in box  Follow-up by: Judith Part MD,  August 30, 2010 1:42 PM  Additional Follow-up for Phone Call Additional follow up Details #1::        Form faxed.              Lowella Petties CMA, AAMA  August 30, 2010 1:53 PM   New Problems: IRRITABLE BOWEL SYNDROME (ICD-564.1)   New Problems: IRRITABLE BOWEL SYNDROME (ICD-564.1)   Past Medical History:    Allergic rhinitis    Anxiety    Depression    Dizziness or vertigo    GERD    Urinary incontinence    osteopenia     early cataracts - no surgery yet 09    gout     chronic sinusitis     IBS with constipation            GYn- Dr Richardson Dopp    ENT - Annalee Genta  Appended Document: prior Berkley Harvey is needed for amitiza Prior auth given for [employer], advised pharmacy.  Approval letter placed on doctors shelf for signature and scanning.

## 2010-09-05 NOTE — Assessment & Plan Note (Signed)
Summary: CPX   Vital Signs:  Patient profile:   65 year old female Height:      65 inches Weight:      195 pounds BMI:     32.57 Temp:     97.3 degrees F oral Pulse rate:   66 / minute Pulse rhythm:   regular BP sitting:   120 / 80  (left arm) Cuff size:   regular  Vitals Entered ByMelody Comas (09/09/10 10:10 AM) CC: cpx    History of Present Illness: here for check up of chronic healthproblems  wt is down 13 lb with bmi of 32 lost 42 lb total and then gained 18 lb back  needs to go back to [employer]   has a boyfriend now - and that is great  going out to eat a lot - needs to stop that  is working and on the go all the time   has some pain in her L ankle - was red and sore and swollen - ? gout  took her mobic and that helped  happened twice  better now   neck is red one day going to work  some redness today - mild itching   is trying to walk for exercise   120/80- great bp  pap 9/10 -- normal  has gyn appt in july  is newly sexualy active    mam 10/11 was nl  self exam  no lumps   colonosc4/11- that is good but had  a large polyp -- wanted one year follow up for colonosc    dexa nl 09  will want to get her dexa this year  taking calcium and vitamin D   Td 4/05 got a flu shot in nov  had zostavax   itchy scalb with dandruff  h Allergies: 1)  ! Penicillin 2)  ! Biaxin 3)  ! Codeine 4)  Paxil  Past History:  Past Surgical History: Last updated: 10/27/2009 Sinus surgery (1976) Knee surgery x 2 (1998) Thumb surgery (1998) Carpal tunnel release x 2 Surgery for poliio as a child Tonsillectomy Stress fracture right foot (1999) Dexa- osteopenia (04/2001), no change (04/2003), improved 2007 Colonoscopy (1998) Hx of urethral dilation Carotid dopplers- neg (11/2000) Colonoscopy- diverticulosis (06/2002) EGD- HH, GERD (08/2002) Pelvic US- neg (07/2003) EGD gastritis and GERD (4/11) colonoscopy  polyp adenoma   (4/11)  Family History: Last updated: 09-Sep-2010 Father: died age 43- CVA , colon polyps  Mother: hematuria, ALZ, some type of kidney disease  paternal aunt colon cancer GF ETOH Siblings:  sister has hypocalcemia - ? 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 kidney problems   Review of Systems General:  Denies fatigue, loss of appetite, and malaise. Eyes:  Denies blurring and eye irritation. ENT:  lot of sinus symptoms with repeted abx  sees Dr Annalee Genta. CV:  Denies chest pain or discomfort, lightheadness, and palpitations. Resp:  Denies cough,  shortness of breath, and wheezing. GI:  Denies abdominal pain, change in bowel habits, indigestion, and nausea. GU:  Denies abnormal vaginal bleeding, discharge, dysuria, and urinary frequency. MS:  Complains of joint pain, joint redness, and joint swelling; better now . Derm:  Complains of itching and rash. Neuro:  Denies headaches, numbness, and tingling. Psych:  Denies anxiety and depression. Endo:  Denies cold intolerance, excessive thirst, excessive urination, and heat intolerance. Heme:  Denies abnormal bruising and bleeding.  Physical Exam  General:  overweight but generally well appearing  Head:  normocephalic, atraumatic, and no abnormalities observed.   Eyes:  vision grossly intact, pupils equal, pupils round, and pupils reactive to light.   no conjunctival pallor, injection or icterus  Ears:  R ear normal and L ear normal.   Nose:  no nasal discharge.   Mouth:  pharynx pink and moist.   Neck:  supple with full rom and no masses or thyromegally, no JVD or carotid bruit  Chest Wall:  No deformities, masses, or tenderness noted. Lungs:  Normal respiratory effort, chest expands symmetrically. Lungs are clear to auscultation, no crackles or wheezes. Heart:  Normal rate and regular rhythm. S1 and S2 normal without gallop, murmur, click, rub or other extra sounds. Abdomen:  Bowel sounds positive,abdomen soft and non-tender without masses, organomegaly or hernias noted. no renal bruits  Msk:  No deformity or scoliosis noted of thoracic or lumbar spine.  no acute joint change s  Pulses:  R and L carotid,radial,femoral,dorsalis pedis and posterior tibial pulses are full and equal bilaterally Extremities:  No clubbing, cyanosis, edema, or deformity noted with normal full range of motion of all joints.   Neurologic:  sensation intact to light touch, gait normal, and DTRs symmetrical and normal.   Skin:  erythematous rash on neck  slt induration/ no open areas  some scale  areas of very dry skin and dandruff in scalp  some plaques and papules grey on color  Cervical Nodes:  No lymphadenopathy noted Inguinal Nodes:  No significant adenopathy Psych:  normal affect, talkative and pleasant    Impression & Recommendations:  Problem # 1:  GOUT, UNSPECIFIED (ICD-274.9) Assessment New may need to stop hct if not better with diet an dmore fluids check uric acid  Her updated medication list for this problem includes:    Mobic 7.5 Mg Tabs (Meloxicam) ..... One by mouth daily as needed with food  Orders: Venipuncture ([address]) TLB-Lipid Panel (80061-LIPID) TLB-BMP (Basic Metabolic Panel-BMET) (80048-METABOL) TLB-Hepatic/Liver Function Pnl (80076-HEPATIC) TLB-TSH (Thyroid Stimulating Hormone) (84443-TSH) T-Vitamin D (25-Hydroxy)  (60454-09811) TLB-Uric Acid, Blood (84550-URIC) Prescription Created Electronically [phone])  Problem # 2:  SCREENING FOR LIPOID DISORDERS (ICD-V77.91) Assessment: Comment Only labs today rev diet- is relatively low in sat fats  disc imp of low chol to cardiovasc health Orders: Venipuncture (29562) TLB-Lipid Panel (80061-LIPID) TLB-BMP (Basic Metabolic Panel-BMET) (80048-METABOL) TLB-Hepatic/Liver Function Pnl (80076-HEPATIC) TLB-TSH (Thyroid Stimulating Hormone) (84443-TSH) T-Vitamin D (25-Hydroxy) (13086-57846) TLB-Uric Acid, Blood (84550-URIC) Prescription Created Electronically [phone])  Problem # 3:  DERMATITIS, SEBORRHEIC (ICD-690.10) Assessment: New  adv use of selsun blue shampoo 1-2 times per week leaving in 5 min before rinsing update if not imp   Orders: Prescription Created Electronically [phone])  Problem # 4:  CONTACT DERMATITIS (ICD-692.9) Assessment: New  this ? due to jewelry or fragrance asked to minimize all exp and made adv of products is improving and mild enough to try otc cortisone call/ flu if not imp  Orders: Prescription Created Electronically [phone])  Problem # 5:  OSTEOPENIA (ICD-733.90) Assessment: Unchanged dise imp of ca and D and exercise check vit D level  rev last dexa  Her updated medication list for this problem includes:    Caltrate 600+d 600-400 Mg-unit Tabs (Calcium carbonate-vitamin d) .Marland Kitchen... Take 1 tablet by mouth two times a day  Orders: Venipuncture (27253) TLB-Lipid Panel (80061-LIPID) TLB-BMP (Basic Metabolic Panel-BMET) (80048-METABOL) TLB-Hepatic/Liver Function Pnl (80076-HEPATIC) TLB-TSH (Thyroid Stimulating Hormone) (84443-TSH) T-Vitamin D (25-Hydroxy) (66440-34742) TLB-Uric Acid, Blood (84550-URIC) Prescription Created Electronically [phone])  Problem # 6:  EDEMA, CHRONIC (ICD-782.3)  this is well controlled with hctz check labs today disc low sodium die twith lots of water  Her updated medication list for this  problem includes:    Hydrochlorothiazide 25 Mg Tabs (Hydrochlorothiazide) .Marland Kitchen... Take one by mouth daily  Orders: Venipuncture (87564) TLB-Lipid Panel (80061-LIPID) TLB-BMP (Basic Metabolic Panel-BMET) (80048-METABOL) TLB-Hepatic/Liver Function Pnl (80076-HEPATIC) TLB-TSH (Thyroid Stimulating Hormone) (84443-TSH) T-Vitamin D (25-Hydroxy) (33295-18841) TLB-Uric Acid, Blood (84550-URIC)  Discussed elevation of the legs, use of compression stockings, sodium restiction, and medication use.   Problem # 7:  DEPRESSION (ICD-311) Assessment: Improved  doing well on effexor  no change in that  enc exercise and staying social and activ e Her updated medication list for this problem includes:    Effexor Xr 75 Mg Cp24 (Venlafaxine hcl) .Marland Kitchen... Take one by mouth daily  Orders: Prescription Created Electronically [phone])  Complete Medication List: 1)  Effexor Xr 75 Mg Cp24 (Venlafaxine hcl) .... Take one by mouth daily 2)  Nexium 40 Mg Cpdr (Esomeprazole magnesium) .... Take one by mouth daily 3)  Hydrochlorothiazide 25 Mg Tabs (Hydrochlorothiazide) .... Take one by mouth daily 4)  Caltrate 600+d 600-400 Mg-unit Tabs (Calcium carbonate-vitamin d) .... Take 1 tablet by mouth two times a day 5)  Mobic 7.5 Mg Tabs (Meloxicam) .... One by mouth daily as needed with food 6)  Potassium Chloride Cr 10 Meq Tbcr (Potassium chloride) .... Take 2 by mouth two times a day 7)  Nasonex 50 Mcg/act Susp (Mometasone furoate) .... Use as directed daily 8)  Amitiza 24 Mcg Caps (Lubiprostone) .... One by mouth two times a day 9)  Cvs Vitamin C 1000 Mg Tabs (Ascorbic acid) .... Take one by mouth daily 10)  Vit D 800 Units  .... Take one by mouth daily 11)  Multivitamins Tabs (Multiple vitamin) .... Take one daily 12)  Mucinex 600 Mg Xr12h-tab (Guaifenesin) .... As needed 13)  Phazyme 125 Mg Chew (Simethicone) .... As needed 14)  Florastor 250 Mg Caps (Saccharomyces boulardii) .... Daily 15)  Tramadol Hcl 50 Mg  Tabs (Tramadol hcl) .... Take one every eight hours for pain as needed 16)  Transderm-scop 1.5 Mg Pt72 (Scopolamine base) .... Apply one patch as directed every 3 days as needed motion sickness  Other Orders: Pneumococcal Vaccine (01601) Admin 1st Vaccine (09323)  Patient Instructions: 1)  call us in sept to sched dexa/ bone density for oct  2)  don't forget to get your colonoscopy in april  3)  use selsun blue shampoo once per wk  4)  drink more water and let me know if gout is reucrrent  5)  use cort aid for neck rash- update me if not improved 6)  avoid fragrances  7)  labs today  Prescriptions: AMITIZA 24 MCG  CAPS (LUBIPROSTONE) one by mouth two times a day  #180 x 3   Entered and Authorized by:   Judith Part MD   Signed by:  Judith Part MD on 08/25/2010   Method used:   Electronically to        [facility] [phone]* (retail)       [address]       [place], [place]  [address]       Ph: [phone]       Fax: [phone]   RxID:   [id number] NASONEX 50 MCG/ACT  SUSP (MOMETASONE FUROATE) use as directed daily  #3 mdi x 3   Entered and Authorized by:   Judith Part MD   Signed by:   Judith Part MD on 08/25/2010   Method used:   Electronically to        [facility] [phone]* (retail)       [address]       [place], [place]  [address]       Ph: [phone]       Fax: [phone]   RxID:   [id number] POTASSIUM CHLORIDE CR 10 MEQ  TBCR (POTASSIUM CHLORIDE) take 2 by mouth two times a day  #3 months x 3   Entered and Authorized by:   Judith Part MD   Signed by:   Judith Part MD on 08/25/2010   Method used:   Electronically to        [facility] [phone]* (retail)       [address]       [place], [place]  [address]       Ph: [phone]       Fax: [phone]   RxID:   [id number] MOBIC 7.5 MG  TABS (MELOXICAM) one by mouth daily as needed with food  #90 x 3   Entered and Authorized by:   Judith Part MD    Signed by:   Judith Part MD on 08/25/2010   Method used:   Electronically to        [facility] [phone]* (retail)       [address]       [place], [place]  [address]       Ph: [phone]       Fax: [phone]   RxID:   [id number] HYDROCHLOROTHIAZIDE 25 MG  TABS (HYDROCHLOROTHIAZIDE) take one by mouth daily  #90 x 3   Entered and Authorized by:   Judith Part MD   Signed by:   Judith Part MD on 08/25/2010   Method used:   Electronically to        [facility] [phone]* (retail)       [address]       [place], [place]  [address]       Ph: [phone]       Fax: [phone]   RxID:   [id number] NEXIUM 40 MG  CPDR (ESOMEPRAZOLE MAGNESIUM) take one by mouth daily  #90 x 3   Entered and Authorized by:   Judith Part MD   Signed by:   Judith Part MD on 08/25/2010   Method used:   Electronically to        [facility] [phone]* (retail)       [address]       [place], [place]  [address]       Ph: [phone]       Fax: [phone]   RxID:   [id number] EFFEXOR XR 75 MG  CP24 (VENLAFAXINE HCL)  take one by mouth daily  #90 x 3   Entered and Authorized by:   Judith Part MD   Signed by:   Judith Part MD on 08/25/2010   Method used:   Electronically to        [facility] [phone]* (retail)       [address]       [place], [place]  [address]       Ph: [phone]       Fax: [phone]   RxID:   [id number]    Orders Added: 1)  Venipuncture [44010] 2)  TLB-Lipid Panel [80061-LIPID] 3)  TLB-BMP (Basic Metabolic Panel-BMET) [80048-METABOL] 4)  TLB-Hepatic/Liver Function Pnl [80076-HEPATIC] 5)  TLB-TSH (Thyroid Stimulating Hormone) [84443-TSH] 6)  T-Vitamin D (25-Hydroxy) [[address]-66440] 7)  TLB-Uric Acid, Blood [84550-URIC] 8)  Pneumococcal Vaccine [90732] 9)  Admin 1st Vaccine [90471] 10)  Prescription Created Electronically [G8553] 11)  Est. Patient Level IV [34742]   Immunizations  Administered:  Pneumonia Vaccine:    Vaccine Type: Pneumovax    Site: left deltoid    Mfr: Merck    Dose: 0.5 ml    Route: IM    Given by: Melody Comas    Exp. Date: 12/01/2011    Lot #: [id number]    VIS given: 06/13/09 version given August 25, 2010.   Immunizations Administered:  Pneumonia Vaccine:    Vaccine Type: Pneumovax    Site: left deltoid    Mfr: Merck    Dose: 0.5 ml    Route: IM    Given by: Melody Comas    Exp. Date: 12/01/2011    Lot #: [id number]    VIS given: 06/13/09 version given August 25, 2010.  Current Allergies (reviewed today): ! PENICILLIN ! BIAXIN ! CODEINE PAXIL   Preventive Care Screening  Mammogram:    Date:  04/12/2010    Results:  normal

## 2010-09-14 NOTE — Medication Information (Signed)
Summary: PA & Approval for Amitiza  PA & Approval for Amitiza   Imported By: Maryln Gottron 09/04/2010 15:24:05  _____________________________________________________________________  External Attachment:    Type:   Image     Comment:   External Document

## 2010-10-11 ENCOUNTER — Telehealth: Payer: Self-pay | Admitting: Gastroenterology

## 2010-10-11 NOTE — Telephone Encounter (Signed)
Pt had COLON 10/21/09 with tubular adenomas in CECUM- hi grade dysplasia. Per your note, COLON in 1 year. Pt wants to know if she needs an OV or Direct COLON? Thanks.

## 2010-10-11 NOTE — Telephone Encounter (Signed)
direct

## 2010-10-12 NOTE — Telephone Encounter (Signed)
Scheduled pt for Pre Visit on 10/27/10 at 1030am, Direct COLON 11/03/10 at 1330. Pt aware.

## 2010-10-24 ENCOUNTER — Ambulatory Visit (AMBULATORY_SURGERY_CENTER): Payer: Medicare Other

## 2010-10-24 VITALS — Ht 65.0 in | Wt 193.0 lb

## 2010-10-24 DIAGNOSIS — Z860100 Personal history of colon polyps, unspecified: Secondary | ICD-10-CM

## 2010-10-27 ENCOUNTER — Telehealth: Payer: Self-pay | Admitting: Gastroenterology

## 2010-10-27 MED ORDER — PEG-KCL-NACL-NASULF-NA ASC-C 100 G PO SOLR: ORAL | Status: DC

## 2010-10-27 NOTE — Telephone Encounter (Signed)
Spoke with patient and notified her that the moviprep was being resent to [facility] [address]. Also called this RX into this pharmacy.

## 2010-11-03 ENCOUNTER — Ambulatory Visit (AMBULATORY_SURGERY_CENTER): Payer: Medicare Other | Admitting: Gastroenterology

## 2010-11-03 ENCOUNTER — Encounter: Payer: Self-pay | Admitting: Gastroenterology

## 2010-11-03 DIAGNOSIS — K5901 Slow transit constipation: Secondary | ICD-10-CM

## 2010-11-03 DIAGNOSIS — K6389 Other specified diseases of intestine: Secondary | ICD-10-CM | POA: Insufficient documentation

## 2010-11-03 DIAGNOSIS — Z860100 Personal history of colon polyps, unspecified: Secondary | ICD-10-CM | POA: Insufficient documentation

## 2010-11-03 DIAGNOSIS — D126 Benign neoplasm of colon, unspecified: Secondary | ICD-10-CM | POA: Insufficient documentation

## 2010-11-03 HISTORY — DX: Slow transit constipation: K59.01

## 2010-11-03 HISTORY — DX: Personal history of colon polyps, unspecified: Z86.0100

## 2010-11-03 HISTORY — DX: Personal history of colonic polyps: Z86.010

## 2010-11-03 MED ORDER — SODIUM CHLORIDE 0.9 % IV SOLN: 500.0000 mL | INTRAVENOUS | Status: DC

## 2010-11-03 NOTE — Patient Instructions (Addendum)
Please refer to green and blue discharge instruction sheets 

## 2010-11-06 ENCOUNTER — Telehealth: Payer: Self-pay | Admitting: *Deleted

## 2010-11-06 NOTE — Telephone Encounter (Signed)
No identifier on answering machine. No message left.

## 2010-11-08 ENCOUNTER — Encounter: Payer: Self-pay | Admitting: Gastroenterology

## 2010-11-09 ENCOUNTER — Telehealth: Payer: Self-pay | Admitting: *Deleted

## 2010-11-09 NOTE — Telephone Encounter (Signed)
I spoke with Andrey Campanile at [facility] on [address] she said not usually a problem, could be how pt's body responds to tablet. Wanted to know how long been happening and also said could switch to another K tablet but on 09/22/10 pt got 90 day supply and if try to reorder another K med with same meq insurance may not pay since has 90 day supply. I spoke with pt and she said has noticed only 2 times since taking the medication. Pt said she will continue to monitor the situation and will call back when she has finished this rx if still having problem.

## 2010-11-09 NOTE — Telephone Encounter (Signed)
Pt is taking generic potassium that is not dissolving, she is asking to change to something that will dissolve easier.  Uses [facility] [address].

## 2010-11-09 NOTE — Telephone Encounter (Signed)
Please call pharmacist there and ask about that problem-- some pills are not supposed to dissolve all the way  See if they have any suggestions or whether we should change to another brand -- see what pharmacist would recommend in terms of brand  Thanks

## 2010-11-10 ENCOUNTER — Telehealth: Payer: Self-pay | Admitting: Gastroenterology

## 2010-11-10 NOTE — Telephone Encounter (Addendum)
Pt was advised her  Results letter was written on 11/08/10, but it may take a few days to be delivered. Explained the letter to her about her polyps- they were not malignant, but had the potential to be cancerous if not closely followed. Pt is to call for any change in bowel habits, abdominal pain, etc. Pt stated understanding.

## 2010-11-29 ENCOUNTER — Encounter: Payer: Self-pay | Admitting: Gastroenterology

## 2011-01-01 ENCOUNTER — Telehealth: Payer: Self-pay | Admitting: *Deleted

## 2011-01-01 DIAGNOSIS — E876 Hypokalemia: Secondary | ICD-10-CM

## 2011-01-01 NOTE — Telephone Encounter (Signed)
Pt had a problem a couple of months ago with her potassium pills not dissolving.  She is almost out and needs a refill but she wants to have her potassium checked first to see if she is getting the full benefit of the pills.  Please advise if ok to schedule.

## 2011-01-02 DIAGNOSIS — E876 Hypokalemia: Secondary | ICD-10-CM | POA: Insufficient documentation

## 2011-01-02 HISTORY — DX: Hypokalemia: E87.6

## 2011-01-02 NOTE — Telephone Encounter (Signed)
Go ahead and check K Here is order

## 2011-01-03 NOTE — Telephone Encounter (Signed)
Patient notified as instructed by telephone. Pt scheduled lab appt 01/05/11 at 9:45am.

## 2011-01-05 ENCOUNTER — Other Ambulatory Visit (INDEPENDENT_AMBULATORY_CARE_PROVIDER_SITE_OTHER): Payer: Medicare Other

## 2011-01-05 DIAGNOSIS — E876 Hypokalemia: Secondary | ICD-10-CM

## 2011-01-05 LAB — POTASSIUM: Potassium: 4.3 mEq/L (ref 3.5–5.1)

## 2011-01-08 ENCOUNTER — Telehealth: Payer: Self-pay | Admitting: *Deleted

## 2011-01-08 NOTE — Telephone Encounter (Signed)
Patient say that she is in the doughnut hole and her Amitiza cost her $254.00 for a 3 month supply. She is asking if there is possibly something cheaper that she could try. She has already picked this rx up so doesn't need anything done right away, but says that she can't continue to pay this.

## 2011-01-08 NOTE — Telephone Encounter (Signed)
I do not know of any generic or cheaper drugs of that class Do we have samples we could give her until she is out of the donut hole?

## 2011-01-09 NOTE — Telephone Encounter (Signed)
Patient notified as instructed by telephone. Amitiza 24 mcg lot #[id number] exp date 02/2014 #48 samples given. Samples left in front office for pt to pick up.

## 2011-01-16 ENCOUNTER — Telehealth: Payer: Self-pay | Admitting: *Deleted

## 2011-01-16 NOTE — Telephone Encounter (Signed)
That is fine  thanks

## 2011-01-16 NOTE — Telephone Encounter (Signed)
Pt is requesting samples of nexium.   #5 boxes of 5 each given.  Lot number [id number], exp 10/2013.

## 2011-02-06 ENCOUNTER — Other Ambulatory Visit: Payer: Self-pay | Admitting: Obstetrics and Gynecology

## 2011-02-06 DIAGNOSIS — Z1231 Encounter for screening mammogram for malignant neoplasm of breast: Secondary | ICD-10-CM

## 2011-02-07 LAB — HM PAP SMEAR: HM Pap smear: NORMAL

## 2011-02-20 ENCOUNTER — Other Ambulatory Visit: Payer: Self-pay | Admitting: Obstetrics and Gynecology

## 2011-02-20 ENCOUNTER — Other Ambulatory Visit (HOSPITAL_COMMUNITY)
Admission: RE | Admit: 2011-02-20 | Discharge: 2011-02-20 | Disposition: A | Discharge status: Home / Self Care | Payer: Medicare Other | Source: Ambulatory Visit | Attending: Obstetrics and Gynecology | Admitting: Obstetrics and Gynecology

## 2011-02-20 DIAGNOSIS — Z01419 Encounter for gynecological examination (general) (routine) without abnormal findings: Secondary | ICD-10-CM | POA: Insufficient documentation

## 2011-04-03 ENCOUNTER — Telehealth: Payer: Self-pay | Admitting: *Deleted

## 2011-04-03 NOTE — Telephone Encounter (Signed)
That is fine, thanks 

## 2011-04-03 NOTE — Telephone Encounter (Signed)
Pt requests samples of nexium and amitiza to help her till she gets out of the donut hole.  # 5 boxes of nexium, # 5 each given. Lot number [id number], exp 12/2013.  # 4 boxes of amitiza, # 4 each given. Lot number [id number], exp 07/2014.

## 2011-04-24 ENCOUNTER — Ambulatory Visit
Admission: RE | Admit: 2011-04-24 | Discharge: 2011-04-24 | Disposition: A | Discharge status: Home / Self Care | Payer: Medicare Other | Source: Ambulatory Visit | Attending: Obstetrics and Gynecology | Admitting: Obstetrics and Gynecology

## 2011-04-24 DIAGNOSIS — Z1231 Encounter for screening mammogram for malignant neoplasm of breast: Secondary | ICD-10-CM

## 2011-05-15 ENCOUNTER — Telehealth: Payer: Self-pay | Admitting: *Deleted

## 2011-05-15 NOTE — Telephone Encounter (Signed)
Thank you :)

## 2011-05-15 NOTE — Telephone Encounter (Signed)
Pt is asking for samples of amitiza and nexium.  4 boxes of amitiza 24 mcg's given, lot number [id number], exp 09/2014.  # 2 boxes of nexium given, lot [id number], exp 12/2013.  Samples placed up front for patient pick up.

## 2011-05-25 ENCOUNTER — Other Ambulatory Visit: Payer: Self-pay | Admitting: Family Medicine

## 2011-07-26 ENCOUNTER — Encounter: Payer: Self-pay | Admitting: Family Medicine

## 2011-07-26 ENCOUNTER — Ambulatory Visit (INDEPENDENT_AMBULATORY_CARE_PROVIDER_SITE_OTHER): Payer: Medicare Other | Admitting: Family Medicine

## 2011-07-26 DIAGNOSIS — J209 Acute bronchitis, unspecified: Secondary | ICD-10-CM

## 2011-07-26 DIAGNOSIS — J0100 Acute maxillary sinusitis, unspecified: Secondary | ICD-10-CM

## 2011-07-26 MED ORDER — LEVOFLOXACIN 500 MG PO TABS: 500.0000 mg | ORAL_TABLET | Freq: Every day | ORAL | Status: AC

## 2011-07-26 NOTE — Progress Notes (Signed)
  Patient Name: Veronica Greene Date of Birth: 1946-01-21 Age: 66 y.o. Medical Record Number: [id number] Gender: female Date of Encounter: 07/26/2011  History of Present Illness:  Veronica Greene is a 66 y.o. very pleasant female patient who presents with the following:  Bad hacking cough. No fever, chills. No muscle / joint aches. Has been sick since sun - 4-5 days Ears are itching and bothering her.  some HA - pain behind eyes  Mucinex, claritin, allegra.   Past Medical History, Surgical History, Social History, Family History, Problem List, Medications, and Allergies have been reviewed and updated if relevant.  Review of Systems: ROS: GEN: Acute illness details above GI: Tolerating PO intake GU: maintaining adequate hydration and urination Pulm: No SOB Interactive and getting along well at home.  Otherwise, ROS is as per the HPI.   Physical Examination: Filed Vitals:   07/26/11 1006  BP: 130/72  Pulse: 75  Temp: 98 F (36.7 C)  TempSrc: Oral  Height: 5\' 4"  (1.626 m)  Weight: 201 lb 1.9 oz (91.227 kg)  SpO2: 98%    Body mass index is 34.52 kg/(m^2).   GEN: A and O x 3. WDWN. NAD.    ENT: Nose clear, ext NML.  No LAD.  No JVD.  TM's clear. Oropharynx clear. Sinuses, max are tender PULM: Normal WOB, no distress. No crackles. Rare rhonchi CV: RRR, no M/G/R, No rubs, No JVD.   EXT: warm and well-perfused, No c/c/e. PSYCH: Pleasant and conversant.   Assessment and Plan: 1. Sinusitis, acute maxillary  levofloxacin (LEVAQUIN) 500 MG tablet  2. Acute bronchitis  levofloxacin (LEVAQUIN) 500 MG tablet    Sinus may be more chronic with preceding sx to this acute infection - cover this Bronchitis. Viral more likely

## 2011-08-09 ENCOUNTER — Ambulatory Visit (INDEPENDENT_AMBULATORY_CARE_PROVIDER_SITE_OTHER): Payer: Medicare Other | Admitting: Family Medicine

## 2011-08-09 ENCOUNTER — Telehealth: Payer: Self-pay | Admitting: Family Medicine

## 2011-08-09 ENCOUNTER — Encounter: Payer: Self-pay | Admitting: Family Medicine

## 2011-08-09 VITALS — BP 120/70 | HR 76 | Temp 98.5°F | Ht 65.0 in | Wt 201.4 lb

## 2011-08-09 DIAGNOSIS — J320 Chronic maxillary sinusitis: Secondary | ICD-10-CM

## 2011-08-09 MED ORDER — PREDNISONE 20 MG PO TABS: 40.0000 mg | ORAL_TABLET | Freq: Every day | ORAL | Status: AC

## 2011-08-09 MED ORDER — LEVOFLOXACIN 500 MG PO TABS: 500.0000 mg | ORAL_TABLET | Freq: Every day | ORAL | Status: AC

## 2011-08-09 NOTE — Telephone Encounter (Signed)
Has appt

## 2011-08-09 NOTE — Telephone Encounter (Signed)
Pt has appt with Dr Patsy Lager today 08/09/11 at 2:15pm.

## 2011-08-09 NOTE — Telephone Encounter (Signed)
Triage Record Num: [id number] Operator: Thayer Headings Patient Name: Veronica Greene Call Date & Time: 08/09/2011 10:20:44AM Patient Phone: [phone] PCP: Hannah Beat Patient Gender: Female PCP Fax : [phone] Patient DOB: August 23, 1945 Practice Name: [facility] Reason for Call: Caller: Jenniger/Patient; PCP: Juleen China.; CB#: [phone]; ; ; Calling today 08/09/11 regarding was in office on 07/26/11 and dx with Sinus infection/bronchitis and was perscribed Levoquin. Finished last pill on 08/05/11. Pt calling today b/c still has raspy cough. Afebrile. Has coughed up yellow mucus. Has been Robitussin and Claritin and has not helped. Emergent symptoms r/o by Cough Adult guidelines with exception of productive cough with colored sputum. Care advice given. Transferred call to office Loistine Simas) to schedule appt. Do not have access to schedule for [place] at this time. Protocol(s) Used: Cough - Adult Protocol(s) Used: Upper Respiratory Infection (URI) Recommended Outcome per Protocol: See Provider within 24 hours Reason for Outcome: Cough is the symptom causing most concern Productive cough with colored sputum (other than clear or white sputum) Care Advice: ~ Use a cool mist humidifier to moisten air. Be sure to clean according to manufacturer's instructions. Limit or avoid exposure to irritants and allergens (e.g. air pollution, smoke/smoking, chemicals, dust, pollen, pet dander, etc.) ~ Increase fluids to 8-12 eight oz (1.6 to 2.4 liters) glasses per day, half of them to be water. Soups, popsicles, fruit juices, non-caffeinated sodas (unless restricting sodium intake), jello, broths, decaf teas, etc. are all okay. Warm fluids can be soothing. ~ ~ If you can, stop smoking now and avoid all secondhand smoke. ~ HEALTH PROMOTION / MAINTENANCE ~ SYMPTOM / CONDITION MANAGEMENT ~ CAUTIONS Coughing up mucus or phlegm helps to get rid of an infection. A productive cough should not  be stopped. A cough medicine with guaifenesin (Robitussin, Mucinex) can help loosen the mucus. Cough medicine with dextromethorphan (DM) should be avoided. Drinking lots of fluids can help loosen the mucus too, especially warm fluids. ~ 08/09/2011 10:47:32AM Page 1 of 1 CAN_TriageRpt_V2

## 2011-08-09 NOTE — Progress Notes (Signed)
  Patient Name: Veronica Greene Date of Birth: 1945/10/06 Age: 66 y.o. Medical Record Number: [id number] Gender: female Date of Encounter: 08/09/2011  History of Present Illness:  Veronica Greene is a 66 y.o. very pleasant female patient who presents with the following:  Seen last week, placed on LVQ. ? Sinus infection. Still coughing all the time -- switched to delsym. Has been sick since 1/15.  No fever. Still having some sinus drainage and some burning with some pain a little on her neck. Eyes and nose are running. Has taken some claritin for about five days straight.   No diffuse body aches.   I regionally examined her 07/26/2011. The patient has been coughing for more than 2 weeks. It is productive and wet. She also has been having some sinus symptoms and pain for greater than a month. We put her on 10 days of Levaquin, and she felt as if her symptoms improve somewhat, but then she had her return of symptoms. She has been afebrile. She does have chronic sinus problems and has been using a variety of antihistamines as well as inhaled nasal steroids.  Past Medical History, Surgical History, Social History, Family History, Problem List, Medications, and Allergies have been reviewed and updated if relevant.  Review of Systems: ROS: GEN: Acute illness details above GI: Tolerating PO intake GU: maintaining adequate hydration and urination Pulm: No SOB Interactive and getting along well at home.  Otherwise, ROS is as per the HPI.   Physical Examination: Filed Vitals:   08/09/11 1416  BP: 120/70  Pulse: 76  Temp: 98.5 F (36.9 C)  TempSrc: Oral  Height: 5\' 5"  (1.651 m)  Weight: 201 lb 6.4 oz (91.354 kg)  SpO2: 99%    Body mass index is 33.51 kg/(m^2).   Gen: WDWN, NAD; alert,appropriate and cooperative throughout exam  HEENT: Normocephalic and atraumatic. Throat clear, w/o exudate, no LAD, R TM clear, L TM - good landmarks, No fluid present. rhinnorhea.  Left frontal and  maxillary sinuses: Tender max Right frontal and maxillary sinuses: Tendermax  Neck: No ant or post LAD CV: RRR, No M/G/R Pulm: Breathing comfortably in no resp distress. no w/c/r Abd: S,NT,ND,+BS Extr: no c/c/e Psych: full affect, pleasant   Assessment and Plan: 1. Chronic maxillary sinusitis  levofloxacin (LEVAQUIN) 500 MG tablet, predniSONE (DELTASONE) 20 MG tablet    Less concerned about her pulmonary symptoms which are likely to be more of a viral bronchitis. Her lungs are clear.  Significant continued maxillary sinus pain with failure of 10 days of Levaquin. She is penicillin allergic. I did place her on 7 days of prednisone x40 mg as well as a 21 day course of Levaquin 500 mg. We discussed other potential options including ENT referral and she would like to proceed with direct management at this point. If she continues to have very symptomatic sinuses at that point, think that she needs specialist evaluation

## 2011-08-24 ENCOUNTER — Ambulatory Visit: Payer: Medicare Other | Admitting: Family Medicine

## 2011-08-24 ENCOUNTER — Telehealth: Payer: Self-pay | Admitting: Family Medicine

## 2011-08-24 DIAGNOSIS — Z0289 Encounter for other administrative examinations: Secondary | ICD-10-CM

## 2011-08-24 NOTE — Telephone Encounter (Signed)
Triage Record Num: [id number] Operator: Geanie Berlin Patient Name: Veronica Greene Call Date & Time: 08/24/2011 12:00:53PM Patient Phone: [phone] PCP: Audrie Gallus. [place] Patient Gender: Female PCP Fax : Patient DOB: [birth year] Practice Name: [facility] Reason for Call: Caller: Dondra/Patient; PCP: Roxy Manns A.; CB#: [phone]; Call regarding Cough, Yellow/green nasal discharge and sore throat that feels like there is lump in throat. Onset 08/21/11. Afebrile Seen 07/26/11 for sinusitis; treated with Levaquin X 10 days. Seen again 08/09/11 for bronchitis; currently on 2nd round Levaquin. Completed 10 days of Prednisone. Asking if should finish Levaquin or take something else. Advised to see MD within 24 hrs for productive cough with colored sputum per URI Guideline. Appt scheduled for 1415 08/24/11 with Dr Para March. Protocol(s) Used: Upper Respiratory Infection (URI) Recommended Outcome per Protocol: See Provider within 24 hours Reason for Outcome: Productive cough with colored sputum (other than clear or white sputum) Care Advice: ~ May inhale steam from hot shower or heated water. Be careful to avoid burns. Limit or avoid exposure to irritants and allergens (e.g. air pollution, smoke/smoking, chemicals, dust, pollen, pet dander, etc.) ~ Increase fluids to 8-12 eight oz (1.6 to 2.4 liters) glasses per day, half of them to be water. Soups, popsicles, fruit juices, non-caffeinated sodas (unless restricting sodium intake), jello, broths, decaf teas, etc. are all okay. Warm fluids can be soothing. ~ ~ If you can, stop smoking now and avoid all secondhand smoke. ~ Warm fluids may help, or try a mixture of honey and lemon juice in warm tea. ~ SYMPTOM / CONDITION MANAGEMENT Coughing up mucus or phlegm helps to get rid of an infection. A productive cough should not be stopped. A cough medicine with guaifenesin (Robitussin, Mucinex) can help loosen the mucus. Cough medicine with  dextromethorphan (DM) should be avoided. Drinking lots of fluids can help loosen the mucus too, especially warm fluids. ~ ~ Go to the ED if having chest pain with breathing or breathing is becoming more difficult. Call provider if has a fever over 101.5 F (38.6 C) that has not responded to home care measures, having shaking chills or any fever in someone immunocompromised/frail elderly. ~ Analgesic/Antipyretic Advice - NSAIDs: Consider aspirin, ibuprofen, naproxen or ketoprofen for pain or fever as directed on label or by pharmacist/provider. PRECAUTIONS: - If over 60 years of age, should not take longer than 1 week without consulting provider. EXCEPTIONS: - Should not be used if taking blood thinners or have bleeding problems. - Do not use if have history of sensitivity/allergy to any of these medications; or history of cardiovascular, ulcer, kidney, liver disease or diabetes unless approved by provider. - Do not exceed recommended dose or frequency. ~ 08/24/2011 12:22:07PM Page 1 of 1 CAN_TriageRpt_V2

## 2011-08-30 ENCOUNTER — Encounter: Payer: Self-pay | Admitting: Family Medicine

## 2011-08-31 ENCOUNTER — Ambulatory Visit (INDEPENDENT_AMBULATORY_CARE_PROVIDER_SITE_OTHER): Payer: Medicare Other | Admitting: Family Medicine

## 2011-08-31 ENCOUNTER — Encounter: Payer: Self-pay | Admitting: Family Medicine

## 2011-08-31 VITALS — BP 124/72 | HR 72 | Temp 98.0°F | Ht 65.0 in | Wt 197.2 lb

## 2011-08-31 DIAGNOSIS — Z860100 Personal history of colon polyps, unspecified: Secondary | ICD-10-CM

## 2011-08-31 DIAGNOSIS — E876 Hypokalemia: Secondary | ICD-10-CM

## 2011-08-31 DIAGNOSIS — IMO0002 Reserved for concepts with insufficient information to code with codable children: Secondary | ICD-10-CM

## 2011-08-31 DIAGNOSIS — J069 Acute upper respiratory infection, unspecified: Secondary | ICD-10-CM

## 2011-08-31 DIAGNOSIS — E669 Obesity, unspecified: Secondary | ICD-10-CM

## 2011-08-31 DIAGNOSIS — Z13220 Encounter for screening for lipoid disorders: Secondary | ICD-10-CM

## 2011-08-31 DIAGNOSIS — F329 Major depressive disorder, single episode, unspecified: Secondary | ICD-10-CM

## 2011-08-31 DIAGNOSIS — M899 Disorder of bone, unspecified: Secondary | ICD-10-CM

## 2011-08-31 DIAGNOSIS — F3289 Other specified depressive episodes: Secondary | ICD-10-CM

## 2011-08-31 DIAGNOSIS — Z79899 Other long term (current) drug therapy: Secondary | ICD-10-CM

## 2011-08-31 HISTORY — DX: Reserved for concepts with insufficient information to code with codable children: IMO0002

## 2011-08-31 LAB — CBC WITH DIFFERENTIAL/PLATELET
Basophils Relative: 0.4 % (ref 0.0–3.0)
Eosinophils Relative: 2.4 % (ref 0.0–5.0)
Lymphocytes Relative: 30.9 % (ref 12.0–46.0)
MCV: 88.4 fl (ref 78.0–100.0)
Neutrophils Relative %: 62.2 % (ref 43.0–77.0)
RBC: 4.42 Mil/uL (ref 3.87–5.11)
WBC: 4.6 10*3/uL (ref 4.5–10.5)

## 2011-08-31 LAB — COMPREHENSIVE METABOLIC PANEL WITH GFR
ALT: 18 U/L (ref 0–35)
AST: 21 U/L (ref 0–37)
Albumin: 4.3 g/dL (ref 3.5–5.2)
Alkaline Phosphatase: 54 U/L (ref 39–117)
BUN: 12 mg/dL (ref 6–23)
CO2: 27 meq/L (ref 19–32)
Calcium: 9.2 mg/dL (ref 8.4–10.5)
Chloride: 103 meq/L (ref 96–112)
Creatinine, Ser: 0.8 mg/dL (ref 0.4–1.2)
GFR: 78.41 mL/min
Glucose, Bld: 92 mg/dL (ref 70–99)
Potassium: 4 meq/L (ref 3.5–5.1)
Sodium: 136 meq/L (ref 135–145)
Total Bilirubin: 0.7 mg/dL (ref 0.3–1.2)
Total Protein: 6.8 g/dL (ref 6.0–8.3)

## 2011-08-31 LAB — LIPID PANEL
Cholesterol: 185 mg/dL (ref 0–200)
HDL: 46 mg/dL
LDL Cholesterol: 114 mg/dL — ABNORMAL HIGH (ref 0–99)
Total CHOL/HDL Ratio: 4
Triglycerides: 124 mg/dL (ref 0.0–149.0)
VLDL: 24.8 mg/dL (ref 0.0–40.0)

## 2011-08-31 LAB — TSH: TSH: 1.32 u[IU]/mL (ref 0.35–5.50)

## 2011-08-31 MED ORDER — ESOMEPRAZOLE MAGNESIUM 40 MG PO CPDR: 40.0000 mg | DELAYED_RELEASE_CAPSULE | Freq: Every day | ORAL | Status: DC

## 2011-08-31 MED ORDER — CYCLOBENZAPRINE HCL 10 MG PO TABS: 10.0000 mg | ORAL_TABLET | Freq: Three times a day (TID) | ORAL | Status: AC | PRN

## 2011-08-31 MED ORDER — VENLAFAXINE HCL ER 75 MG PO CP24: 75.0000 mg | ORAL_CAPSULE | Freq: Every day | ORAL | Status: DC

## 2011-08-31 MED ORDER — POTASSIUM CHLORIDE ER 10 MEQ PO TBCR: 20.0000 meq | EXTENDED_RELEASE_TABLET | Freq: Two times a day (BID) | ORAL | Status: DC

## 2011-08-31 MED ORDER — MOMETASONE FUROATE 50 MCG/ACT NA SUSP: 2.0000 | Freq: Every day | NASAL | Status: DC

## 2011-08-31 MED ORDER — HYDROCHLOROTHIAZIDE 25 MG PO TABS: 25.0000 mg | ORAL_TABLET | Freq: Every day | ORAL | Status: DC

## 2011-08-31 NOTE — Assessment & Plan Note (Signed)
Several weeks after sinusitis So far mild Reassuring exam Disc symptomatic care - see instructions on AVS  Update if not starting to improve in a week or if worsening

## 2011-08-31 NOTE — Assessment & Plan Note (Signed)
Doing well on effexor Starting exercise  Refilled med

## 2011-08-31 NOTE — Assessment & Plan Note (Signed)
Discussed how this problem influences overall health and the risks it imposes  Reviewed plan for weight loss with lower calorie diet (via better food choices and also portion control or program like weight watchers) and exercise building up to or more than 30 minutes 5 days per week including some aerobic activity    

## 2011-08-31 NOTE — Assessment & Plan Note (Signed)
Cervical-pt sees chriropractor Refilled flexeril for prn use today Warned of sedation

## 2011-08-31 NOTE — Progress Notes (Signed)
Subjective:    Patient ID: Veronica Greene, female    DOB: Jun 26, 1946, 66 y.o.   MRN: [id number]  HPI Here for check up of chronic medical conditions and to review health mt list   She is doing pretty good  Has had a cold and just finished last day of levaquin  For sinusitis ([place]) -- needed 21 days - longer course - was not getting better - got prednisone too (finished the 10th)  Got better and then caught a cold  Brown nasal discharge this am  Taking mucinex   Has deg disc dz in neck and flexeril helps - takes it sparingly  Goes to chiropractor    bp is 124/72 Wt is down 4 lb wth bmi of 32 Diet- just started back with weight watchers on Wednesday (had lost and gained back) Exercise--not yet - plans to start walking out and indoor   Flu shot - did get in nov  colonosc 4/12-- 5 polyps - goes back this year -- has had a hard time with polyps  Hx of polyps   Osteopenia in past Last dexa had imp in 11/09 Wants to do that in oct when she gets mammo Ca and D  Pap 8/12 gyn -Dr Richardson Dopp  mammo 10/12 gyn  Self exam no breast lumps   Due labs- will do those (was on prednisone )   Mood stable on current meds- overall good and well controlled  Patient Active Problem List  Diagnoses  . POLIOMYELITIS  . OBESITY  . ANXIETY  . DEPRESSION  . ALLERGIC RHINITIS  . GERD  . IBS  . OVERACTIVE BLADDER  . OSTEOARTHRITIS, HANDS, BILATERAL  . FIBROMYALGIA  . OSTEOPENIA  . DIZZINESS OR VERTIGO  . EDEMA, CHRONIC  . DYSURIA  . URINARY INCONTINENCE  . OTHER NONSPECIFIC FINDING EXAMINATION OF URINE  . HEMATURIA, HX OF  . GOUT, UNSPECIFIED  . DERMATITIS, SEBORRHEIC  . CONTACT DERMATITIS  . Personal history of colonic polyps  . Constipation, slow transit  . Benign neoplasm of colon  . Melanosis coli  . Hypokalemia  . Screening for lipoid disorders  . Viral URI with cough  . Degenerative disk disease   Past Medical History  Diagnosis Date  . Allergy     allergic rhinitis  .  GERD (gastroesophageal reflux disease)   . Anxiety   . Hypertension   . Depression   . Dizziness   . Urinary incontinence   . Osteopenia   . Cataracts, bilateral     early cataracts  . Gout   . Sinusitis, chronic   . IBS (irritable bowel syndrome)     with constipation   Past Surgical History  Procedure Date  . Hand surgery     Bil  . Carpal tunnel release     Bil  . Thumb arthroscopy     Bil/  . Tonsillectomy     66 years old  . Colonoscopy   . Polio surg   . Ankle surgery     right  . Polypectomy   . Nasal sinus surgery 1976  . Knee surgery 1998    x2   History  Substance Use Topics  . Smoking status: Never Smoker   . Smokeless tobacco: Never Used  . Alcohol Use: No   Family History  Problem Relation Age of Onset  . Kidney disease Mother   . Stroke Father   . Kidney disease Sister   . Cancer Paternal Aunt  COLON CA   Allergies  Allergen Reactions  . Clarithromycin     REACTION: throat swells  . Codeine     REACTION: nausea and vomiting  . Paroxetine     REACTION: doesn't work  . Penicillins     REACTION: whelps   Current Outpatient Prescriptions on File Prior to Visit  Medication Sig Dispense Refill  . AMITIZA 24 MCG capsule Take 24 mcg by mouth 2 (two) times daily with a meal.       . Ascorbic Acid (VITAMIN C) 1000 MG tablet Take 1,000 mg by mouth daily.        Marland Kitchen aspirin 81 MG EC tablet Take 81 mg by mouth daily.        . Calcium Carbonate-Vitamin D (CALCIUM-VITAMIN D) 500-200 MG-UNIT per tablet Take 1 tablet by mouth daily. Take 400 units daily       . ESTRACE VAGINAL 0.1 MG/GM vaginal cream       . guaiFENesin (MUCINEX) 600 MG 12 hr tablet Take 1,200 mg by mouth as needed.        . meloxicam (MOBIC) 7.5 MG tablet Take 7.5 mg by mouth as needed.        . Misc Natural Products (OSTEO BI-FLEX JOINT SHIELD PO) Take by mouth daily.        . Multiple Vitamin (MULTIVITAMIN) tablet Take 1 tablet by mouth daily.         Current Facility-Administered  Medications on File Prior to Visit  Medication Dose Route Frequency Provider Last Rate Last Dose  . 0.9 %  sodium chloride infusion  500 mL Intravenous Continuous Sheryn Bison, MD         Review of Systems Review of Systems  Constitutional: Negative for fever, appetite change,  and unexpected weight change. pos for fatigue Eyes: Negative for pain and visual disturbance.  ENT pos for runny./stuffy nose and drip/ no sinus pain  Respiratory: Negative for sob or wheeze/pos for cough   Cardiovascular: Negative for cp or palpitations    Gastrointestinal: Negative for nausea, diarrhea and constipation.  Genitourinary: Negative for urgency and frequency.  Skin: Negative for pallor or rash   Neurological: Negative for weakness, light-headedness, numbness and headaches.  Hematological: Negative for adenopathy. Does not bruise/bleed easily.  Psychiatric/Behavioral: Negative for dysphoric mood. The patient is not nervous/anxious.  overall depression is well controlled        Objective:   Physical Exam  Constitutional: She appears well-developed and well-nourished. No distress.       Obese and well appearing   HENT:  Head: Normocephalic and atraumatic.  Right Ear: External ear normal.  Left Ear: External ear normal.  Mouth/Throat: No oropharyngeal exudate.       Nares are injected and congested  No sinus tenderness Post nasal drip evident  Eyes: Conjunctivae and EOM are normal. Pupils are equal, round, and reactive to light. Right eye exhibits no discharge. Left eye exhibits no discharge.  Neck: Normal range of motion. Neck supple. No JVD present. Carotid bruit is not present. No thyromegaly present.  Cardiovascular: Normal rate, regular rhythm, normal heart sounds and intact distal pulses.  Exam reveals no gallop.   Pulmonary/Chest: Effort normal and breath sounds normal. No respiratory distress. She has no wheezes. She has no rales. She exhibits no tenderness.  Abdominal: Soft. Bowel  sounds are normal. She exhibits no distension, no abdominal bruit and no mass. There is no tenderness.  Musculoskeletal: Normal range of motion. She exhibits no edema and  no tenderness.  Lymphadenopathy:    She has no cervical adenopathy.  Neurological: She is alert. She has normal reflexes. No cranial nerve deficit. She exhibits normal muscle tone. Coordination normal.  Skin: Skin is warm and dry. No rash noted. No erythema. No pallor.  Psychiatric: She has a normal mood and affect.          Assessment & Plan:

## 2011-08-31 NOTE — Assessment & Plan Note (Signed)
Takes 2 K suppl bid -hard to remember Lab today and update - cmet  Had fam hx of kidney dz also

## 2011-08-31 NOTE — Assessment & Plan Note (Signed)
Lipids today Rev low sat fat diet and imp of this

## 2011-08-31 NOTE — Patient Instructions (Addendum)
Labs today Continue mucinex and fluids for your cold  Call us in sept to set up mammo and dexa for October  Work on healthy diet and exercise

## 2011-08-31 NOTE — Assessment & Plan Note (Signed)
Getting frequent colonoscopies- for another one this year Last 4/12- suboptimal prep

## 2011-08-31 NOTE — Assessment & Plan Note (Signed)
Pt due dexa - though last one in 09 imp Rev ca and D D level today with labs Pt will sched that when she sched her mammo in oct

## 2011-09-07 ENCOUNTER — Telehealth: Payer: Self-pay | Admitting: Family Medicine

## 2011-09-07 NOTE — Telephone Encounter (Signed)
This could just be the end stages of the cold she had at last visit - does take a while to get better  If worse- update Otherwise continue fluids/ mucinex and f/u next week if not improving

## 2011-09-07 NOTE — Telephone Encounter (Signed)
Patient notified as instructed by telephone. 

## 2011-09-07 NOTE — Telephone Encounter (Signed)
Pt is feeling very tired and not sure what is causing it. Still has a cough and has discharge from her nose that is yellowish/greenish. She does Muscinex 2x a day and is out of antibiotics.

## 2011-10-09 ENCOUNTER — Encounter: Payer: Self-pay | Admitting: Gastroenterology

## 2011-10-25 ENCOUNTER — Ambulatory Visit (AMBULATORY_SURGERY_CENTER): Payer: Medicare Other | Admitting: *Deleted

## 2011-10-25 VITALS — Ht 64.75 in | Wt 185.0 lb

## 2011-10-25 DIAGNOSIS — Z1211 Encounter for screening for malignant neoplasm of colon: Secondary | ICD-10-CM

## 2011-10-25 MED ORDER — PEG-KCL-NACL-NASULF-NA ASC-C 100 G PO SOLR: ORAL | Status: DC

## 2011-10-26 ENCOUNTER — Encounter: Payer: Self-pay | Admitting: Gastroenterology

## 2011-11-09 ENCOUNTER — Encounter: Payer: Self-pay | Admitting: Gastroenterology

## 2011-11-09 ENCOUNTER — Ambulatory Visit (AMBULATORY_SURGERY_CENTER): Payer: Medicare Other | Admitting: Gastroenterology

## 2011-11-09 VITALS — BP 138/71 | HR 66 | Temp 96.7°F | Resp 20 | Ht 65.0 in | Wt 185.0 lb

## 2011-11-09 DIAGNOSIS — K589 Irritable bowel syndrome without diarrhea: Secondary | ICD-10-CM

## 2011-11-09 DIAGNOSIS — Z1211 Encounter for screening for malignant neoplasm of colon: Secondary | ICD-10-CM

## 2011-11-09 DIAGNOSIS — K6389 Other specified diseases of intestine: Secondary | ICD-10-CM

## 2011-11-09 DIAGNOSIS — Z86010 Personal history of colonic polyps: Secondary | ICD-10-CM

## 2011-11-09 MED ORDER — SODIUM CHLORIDE 0.9 % IV SOLN: 500.0000 mL | INTRAVENOUS | Status: DC

## 2011-11-09 NOTE — Progress Notes (Signed)
Propofol given per J Nulty CRNA 

## 2011-11-09 NOTE — Patient Instructions (Signed)

## 2011-11-09 NOTE — Op Note (Signed)
[facility] [address] [place], [place]  [address]  COLONOSCOPY PROCEDURE REPORT  PATIENT:  Veronica, Greene  MR#:  [id number] BIRTHDATE:  February 28, 1946, 65 yrs. old  GENDER:  female ENDOSCOPIST:  Vania Rea. Jarold Motto, MD, [facility] REF. BY: PROCEDURE DATE:  11/09/2011 PROCEDURE:  Surveillance Colonoscopy ASA CLASS:  Class II INDICATIONS:  history of pre-cancerous (adenomatous) colon polyps  MEDICATIONS:   propofol (Diprivan) 200 mg IV  DESCRIPTION OF PROCEDURE:   After the risks and benefits and of the procedure were explained, informed consent was obtained. Digital rectal exam was performed and revealed no abnormalities. The LB [id number] endoscope was introduced through the anus and advanced to the cecum, which was identified by both the appendix and ileocecal valve.  The quality of the prep was poor, using MoviPrep.  The instrument was then slowly withdrawn as the colon was fully examined. <<PROCEDUREIMAGES>>  FINDINGS:  Melanosis coli was found. TORTUOUS AND REDUNDANT COLON.VERY POOR PREP!!!!!  No polyps or cancers were seen.  This was otherwise a normal examination of the colon.   Retroflexed views in the rectum revealed no abnormalities.    The scope was then withdrawn from the patient and the procedure completed.  COMPLICATIONS:  None ENDOSCOPIC IMPRESSION: 1) Melanosis 2) No polyps or cancers RECOMMENDATIONS: 1) Repeat Colonoscopy in 5 years.  REPEAT EXAM:  No  ______________________________ Vania Rea. Jarold Motto, MD, [name]  CC:  n. eSIGNED:   Vania Rea. Chanceler Pullin at 11/09/2011 02:19 PM  [name], [phone]

## 2011-11-09 NOTE — Progress Notes (Signed)
Patient did not experience any of the following events: a burn prior to discharge; a fall within the facility; wrong site/side/patient/procedure/implant event; or a hospital transfer or hospital admission upon discharge from the facility. (G8907) Patient did not have preoperative order for IV antibiotic SSI prophylaxis. (G8918)  

## 2011-11-12 ENCOUNTER — Telehealth: Payer: Self-pay | Admitting: *Deleted

## 2011-11-12 NOTE — Telephone Encounter (Signed)
  Follow up Call-  Call back number 11/09/2011 11/03/2010  Post procedure Call Back phone  # [phone]  Permission to leave phone message Yes -     Patient questions:  Do you have a fever, pain , or abdominal swelling? no Pain Score  0 *  Have you tolerated food without any problems? yes  Have you been able to return to your normal activities? yes  Do you have any questions about your discharge instructions: Diet   no Medications  no Follow up visit  no  Do you have questions or concerns about your Care? no  Actions: * If pain score is 4 or above: No action needed, pain <4.

## 2011-11-15 ENCOUNTER — Other Ambulatory Visit: Payer: Self-pay | Admitting: Family Medicine

## 2011-12-12 ENCOUNTER — Telehealth: Payer: Self-pay | Admitting: Family Medicine

## 2011-12-12 ENCOUNTER — Telehealth: Payer: Self-pay

## 2011-12-12 DIAGNOSIS — M899 Disorder of bone, unspecified: Secondary | ICD-10-CM

## 2011-12-12 DIAGNOSIS — Z1231 Encounter for screening mammogram for malignant neoplasm of breast: Secondary | ICD-10-CM | POA: Insufficient documentation

## 2011-12-12 MED ORDER — LUBIPROSTONE 24 MCG PO CAPS: ORAL_CAPSULE | ORAL | Status: DC

## 2011-12-12 MED ORDER — ESOMEPRAZOLE MAGNESIUM 40 MG PO CPDR: 40.0000 mg | DELAYED_RELEASE_CAPSULE | Freq: Every day | ORAL | Status: DC

## 2011-12-12 NOTE — Telephone Encounter (Signed)
Orders are done

## 2011-12-12 NOTE — Telephone Encounter (Signed)
Inform patient's spouse Samples are ready for p/u Mon-Fri 8a-5p/SLS

## 2011-12-12 NOTE — Telephone Encounter (Signed)
Dr Milinda Antis, Please put orders in Epic for her Bone Density and her screening mammogram and then I can call [facility] to schedule these for her in October, Thanks, [facility]

## 2011-12-12 NOTE — Telephone Encounter (Signed)
Pt tried to make appt for mammo and bone density and was told Dr Royden Purl office would need to schedule.Pt request mammogram and bone densitiy appt on 04/24/12 around 2 pm at [facility] on [address]. [place]. Pt will wait to hear from pt care coordinator.Will send message to [facility].

## 2011-12-12 NOTE — Telephone Encounter (Signed)
She can have them if we have them

## 2011-12-12 NOTE — Telephone Encounter (Signed)
Pt in donut hole with insurance. Pt request samples of Amitiza and Nexium.Please advise.

## 2012-01-01 ENCOUNTER — Other Ambulatory Visit: Payer: Self-pay | Admitting: Family Medicine

## 2012-01-02 ENCOUNTER — Other Ambulatory Visit: Payer: Self-pay | Admitting: Family Medicine

## 2012-01-02 NOTE — Telephone Encounter (Signed)
Pt said Dr Eulah Pont filled years ago for fibromyalgia and arthritis but Dr Milinda Antis has filled the last several prescriptions.

## 2012-01-02 NOTE — Telephone Encounter (Signed)
Message left for patient to return my call and advise.  

## 2012-01-02 NOTE — Telephone Encounter (Signed)
plz notify sent in.  Will also route to PCP as Veronica Greene

## 2012-01-02 NOTE — Telephone Encounter (Signed)
[facility] [address] request Meloxicam. Please advise in Dr Royden Purl absence.

## 2012-01-02 NOTE — Telephone Encounter (Signed)
Who has prescribed this in past?  I don't see where we have.

## 2012-02-14 ENCOUNTER — Telehealth: Payer: Self-pay

## 2012-02-14 ENCOUNTER — Telehealth: Payer: Self-pay | Admitting: Family Medicine

## 2012-02-14 NOTE — Telephone Encounter (Signed)
She can have some if we have them 

## 2012-02-14 NOTE — Telephone Encounter (Signed)
Can have samples if we have them 

## 2012-02-14 NOTE — Telephone Encounter (Signed)
Caller: Veronica Greene/Patient; PCP: Roxy Manns A.; CB#: [phone];  Call Requesting Samples of Nexium, Nasonex, and Amtisa; SHE IS IN DONUT HOLE AND INSURANCE DOESNT COVER VERY MUCH OF THE COST. NEXIUM IS $300.00 WITH COVERAGE. SHE IS COMING BY OFFICE  AFTER LUNCH ON 02/15/12 AND CAN STOP BY IF SAMPLES AVAILABLE. PLEASE GIVE HER CALL ON CELL#[phone].

## 2012-02-14 NOTE — Telephone Encounter (Signed)
Pt still in donut hole request samples for Nexium, Amitiza and Nasonex.Please advise.

## 2012-02-15 NOTE — Telephone Encounter (Signed)
Spoke to patient, samples are in the front office to pick up.

## 2012-02-20 ENCOUNTER — Encounter: Payer: Self-pay | Admitting: Family Medicine

## 2012-02-20 ENCOUNTER — Ambulatory Visit (INDEPENDENT_AMBULATORY_CARE_PROVIDER_SITE_OTHER): Payer: Medicare Other | Admitting: Family Medicine

## 2012-02-20 VITALS — BP 118/58 | Temp 98.1°F | Wt 181.0 lb

## 2012-02-20 DIAGNOSIS — M25432 Effusion, left wrist: Secondary | ICD-10-CM

## 2012-02-20 DIAGNOSIS — M25439 Effusion, unspecified wrist: Secondary | ICD-10-CM

## 2012-02-20 NOTE — Progress Notes (Signed)
Nature conservation officer at [facility] [address] [place] [address] Phone: [phone] Fax: [phone]  Date:  02/20/2012   Name:  Veronica Greene   DOB:  Nov 29, 1945   MRN:  [id number] Gender: female  Age: 66 y.o.  PCP:  Roxy Manns, MD    Chief Complaint: Pain and swelling in left hand since this morning   History of Present Illness:  Veronica Greene is a 66 y.o. pleasant patient who presents with the following:  L wrist: the patient awoke this morning after having no trauma at all and has some significant dorsal wrist swelling and difficulty moving her wrist. It is not red or hot. No history of trauma. She also is having some mild right-sided wrist pain. At the time of the initial interview she denied any other joint pains. No fever or chills. No diffuse myalgias. She does have some other joint pains and knee pain with known osteoarthritis, but no change in baseline at initial encounter. No recent tick bites. Afebrile.  Verbally the patient denies a history of gout, but there is a history of gout noted on the patient's problem list.  Patient Active Problem List  Diagnosis  . POLIOMYELITIS  . OBESITY  . ANXIETY  . DEPRESSION  . ALLERGIC RHINITIS  . GERD  . IBS  . OVERACTIVE BLADDER  . OSTEOARTHRITIS, HANDS, BILATERAL  . FIBROMYALGIA  . OSTEOPENIA  . DIZZINESS OR VERTIGO  . EDEMA, CHRONIC  . DYSURIA  . URINARY INCONTINENCE  . OTHER NONSPECIFIC FINDING EXAMINATION OF URINE  . HEMATURIA, HX OF  . GOUT, UNSPECIFIED  . DERMATITIS, SEBORRHEIC  . CONTACT DERMATITIS  . Personal history of colonic polyps  . Constipation, slow transit  . Benign neoplasm of colon  . Melanosis coli  . Hypokalemia  . Screening for lipoid disorders  . Viral URI with cough  . Degenerative disk disease  . Other screening mammogram    Past Medical History  Diagnosis Date  . Allergy     allergic rhinitis  . GERD (gastroesophageal reflux disease)   . Anxiety   . Hypertension   .  Depression   . Dizziness   . Urinary incontinence   . Osteopenia   . Cataracts, bilateral     early cataracts  . Gout   . Sinusitis, chronic   . IBS (irritable bowel syndrome)     with constipation  . Arthritis     Past Surgical History  Procedure Date  . Hand surgery     Bil  . Carpal tunnel release     Bil  . Thumb arthroscopy     Bil/  . Tonsillectomy     66 years old  . Colonoscopy   . Polio surg   . Ankle surgery     right  . Polypectomy   . Nasal sinus surgery 1976  . Knee surgery 1998    x2    History  Substance Use Topics  . Smoking status: Never Smoker   . Smokeless tobacco: Never Used  . Alcohol Use: No    Family History  Problem Relation Age of Onset  . Kidney disease Mother   . Stroke Father   . Colon cancer Father   . Kidney disease Sister   . Cancer Paternal Aunt     COLON CA    Allergies  Allergen Reactions  . Clarithromycin     REACTION: throat swells  . Codeine     REACTION: nausea and vomiting  .  Paroxetine     REACTION: doesn't work  . Penicillins     REACTION: whelps    Medication list has been reviewed and updated.  Current Outpatient Prescriptions on File Prior to Visit  Medication Sig Dispense Refill  . Ascorbic Acid (VITAMIN C) 1000 MG tablet Take 1,000 mg by mouth daily.        Marland Kitchen aspirin 81 MG EC tablet Take 81 mg by mouth daily.        . Calcium Carbonate-Vitamin D (CALCIUM-VITAMIN D) 500-200 MG-UNIT per tablet Take 1 tablet by mouth daily. Take 400 units daily       . cyclobenzaprine (FLEXERIL) 10 MG tablet Take 1 tablet by mouth Once daily as needed. Muscle relaxer      . esomeprazole (NEXIUM) 40 MG capsule Take 1 capsule (40 mg total) by mouth daily before breakfast.  30 capsule  0  . ESTRACE VAGINAL 0.1 MG/GM vaginal cream       . guaiFENesin (MUCINEX) 600 MG 12 hr tablet Take 1,200 mg by mouth as needed.        . hydrochlorothiazide (HYDRODIURIL) 25 MG tablet Take 1 tablet (25 mg total) by mouth daily.  90 tablet   3  . lubiprostone (AMITIZA) 24 MCG capsule take 1 capsule by mouth twice a day  32 capsule  0  . meloxicam (MOBIC) 7.5 MG tablet take 1 tablet by mouth once daily with food  90 tablet  1  . Misc Natural Products (OSTEO BI-FLEX JOINT SHIELD PO) Take by mouth daily.        . mometasone (NASONEX) 50 MCG/ACT nasal spray Place 2 sprays into the nose daily.  51 g  3  . Multiple Vitamin (MULTIVITAMIN) tablet Take 1 tablet by mouth daily.        . potassium chloride (KLOR-CON 10) 10 MEQ tablet Take 2 tablets (20 mEq total) by mouth 2 (two) times daily.  360 tablet  3  . venlafaxine (EFFEXOR-XR) 75 MG 24 hr capsule Take 1 capsule (75 mg total) by mouth daily.  90 capsule  3    Review of Systems:  As above. Joint complaints. Some difficulty with terminal flexion of one of her fingers without triggering. No trauma or accident. No bruising. No numbness.  Physical Examination: Filed Vitals:   02/20/12 1519  BP: 118/58  Temp: 98.1 F (36.7 C)   Filed Vitals:   02/20/12 1519  Weight: 181 lb (82.101 kg)   There is no height on file to calculate BMI. Ideal Body Weight:     GEN: WDWN, NAD, Non-toxic, Alert & Oriented x 3 HEENT: Atraumatic, Normocephalic.  Ears and Nose: No external deformity. EXTR: No clubbing/cyanosis/edema NEURO: Normal gait.  PSYCH: Normally interactive. Conversant. Not depressed or anxious appearing.  Calm demeanor.   L hand Ecchymosis or edema: significant swelling on the dorsum of the wrist ROM wrist/hand/digits: notable greater than 50% loss of movement with flexion and extension at the wrist which causes pain. Carpals, MCP's, digits: 3rd or 4th digit lacks terminal flexion, but there is no triggering any can be moved easily passively Distal Ulna and Radius: NT Ecchymosis or edema: neg No instability Cysts/nodules: neg Digit triggering: neg Finkelstein's test: neg Snuffbox tenderness: neg Scaphoid tubercle: NT Resisted supination: NT Full composite fist, no  malrotation Grip, all digits: 4/5 str DIPJT: NT PIP JT: NT MCP JT: NT Axial load test: pos Phalen's: neg Tinel's: neg Atrophy: neg  Hand sensation: intact   Assessment and Plan: 1. Swelling of  joint, wrist, left     The patient has a large swelling on the dorsum of her wrist, and I initially favored a small ganglion. It could also be a very large wrist effusion. We discussed various treatment options, and decided we would try to aspirate it to see if we can get any fluid or ganglion contents out of her wrist, but regardless some injection of corticosteroid would likely make her wrist feel better.  Injection, Intraarticular Wrist, L Patient verbally consented for procedure; risks (including infection), benefits, and alternatives explained. Swollen area palpated easily. Patient prepped with Chloraprep. Ethyl chloride used for anesthesia. Using sterile technique, just distal to Lister's tubercle, using 3 cc syringe with 18 gauge needle, needle inserted and aspirated, no blood, and attempted to aspirate some fluid or ganglion contents, however none was aspirated. Clinically he felt easily be in intra-articular wrist pain, therefore, 1/2 cc Lidocaine 1% and 1/2 cc Depo-Medrol 40 mg (equivalent to Depo-medrol 20 mg) injected without difficulty into wrist.  Pressue applied, minimal blood. Tolerated well, decreased pain, no complications.   Addendum: Called and discussed with patient 1 day later -- wrist feeling a little bit better, but now with some knee swelling, R wrist hurting a little bit more. Reports no known history of gout, but gout is listed in her problem list. Will send in colchicine and prednisone. No fever, chills, tick bites, or systemic myalgias.  Meds ordered this encounter  Medications  . predniSONE (DELTASONE) 20 MG tablet    Sig: 2 tabs po daily for 3 days, then 1 po daily for 4 days    Dispense:  10 tablet    Refill:  0  . colchicine 0.6 MG tablet    Sig: Take 1 tablet  (0.6 mg total) by mouth 2 (two) times daily.    Dispense:  60 tablet    Refill:  0   There are no discontinued medications.  Hannah Beat, MD

## 2012-02-21 ENCOUNTER — Telehealth: Payer: Self-pay | Admitting: *Deleted

## 2012-02-21 ENCOUNTER — Other Ambulatory Visit (HOSPITAL_COMMUNITY)
Admission: RE | Admit: 2012-02-21 | Discharge: 2012-02-21 | Disposition: A | Discharge status: Home / Self Care | Payer: Medicare Other | Source: Ambulatory Visit | Attending: Obstetrics and Gynecology | Admitting: Obstetrics and Gynecology

## 2012-02-21 ENCOUNTER — Other Ambulatory Visit: Payer: Self-pay | Admitting: Obstetrics and Gynecology

## 2012-02-21 DIAGNOSIS — Z124 Encounter for screening for malignant neoplasm of cervix: Secondary | ICD-10-CM | POA: Insufficient documentation

## 2012-02-21 MED ORDER — COLCHICINE 0.6 MG PO TABS: 0.6000 mg | ORAL_TABLET | Freq: Two times a day (BID) | ORAL | Status: DC

## 2012-02-21 MED ORDER — PREDNISONE 20 MG PO TABS: ORAL_TABLET | ORAL | Status: AC

## 2012-02-21 NOTE — Telephone Encounter (Signed)
Thanks for the update - let her know I am also out of the office and will need to touch base with Dr Patsy Lager-- I need to wait for his eval documentation is done before I can comment any further  If she is having any fever/ rash/ tick bites --- please let me know  Otherwise I will make a plan after I can review the info

## 2012-02-21 NOTE — Telephone Encounter (Signed)
Patient still having a lot of pain in all her joints patient doesn't know what is going on. She was seen yesterday for hand pain and swelling. What does she do next? Dr. Patsy Lager not in office will route tower who is her PCP

## 2012-02-21 NOTE — Telephone Encounter (Signed)
Patient having isolated wrist pain with ganglion yesterday, discussed extensively with her yesterday, no diffuse arthralgias. Will call patient and discuss with her.

## 2012-04-21 ENCOUNTER — Telehealth: Payer: Self-pay

## 2012-04-21 NOTE — Telephone Encounter (Signed)
Pt presently in donut hole and request samples of Nexium 40 mg and Amitiza 24 mcg.Please advise.

## 2012-04-21 NOTE — Telephone Encounter (Signed)
She can have some if we have it  thanks

## 2012-04-21 NOTE — Telephone Encounter (Signed)
Advise pt we do have samples and pt said she will pick them up on Thursday 04/24/12

## 2012-04-24 ENCOUNTER — Ambulatory Visit: Payer: Medicare Other

## 2012-04-24 ENCOUNTER — Ambulatory Visit
Admission: RE | Admit: 2012-04-24 | Discharge: 2012-04-24 | Disposition: A | Discharge status: Home / Self Care | Payer: Medicare Other | Source: Ambulatory Visit | Attending: Family Medicine | Admitting: Family Medicine

## 2012-04-24 ENCOUNTER — Other Ambulatory Visit: Payer: Medicare Other

## 2012-04-24 DIAGNOSIS — M899 Disorder of bone, unspecified: Secondary | ICD-10-CM

## 2012-04-24 DIAGNOSIS — Z1231 Encounter for screening mammogram for malignant neoplasm of breast: Secondary | ICD-10-CM

## 2012-04-24 LAB — HM DEXA SCAN: HM Dexa Scan: NORMAL

## 2012-04-28 ENCOUNTER — Encounter: Payer: Self-pay | Admitting: Family Medicine

## 2012-04-28 ENCOUNTER — Encounter: Payer: Self-pay | Admitting: *Deleted

## 2012-04-29 ENCOUNTER — Encounter: Payer: Self-pay | Admitting: *Deleted

## 2012-07-29 ENCOUNTER — Ambulatory Visit (INDEPENDENT_AMBULATORY_CARE_PROVIDER_SITE_OTHER): Payer: Medicare Other | Admitting: Family Medicine

## 2012-07-29 ENCOUNTER — Encounter: Payer: Self-pay | Admitting: Family Medicine

## 2012-07-29 ENCOUNTER — Telehealth: Payer: Self-pay | Admitting: Family Medicine

## 2012-07-29 VITALS — BP 112/62 | HR 75 | Temp 98.7°F | Ht 65.0 in | Wt 188.0 lb

## 2012-07-29 DIAGNOSIS — J069 Acute upper respiratory infection, unspecified: Secondary | ICD-10-CM | POA: Insufficient documentation

## 2012-07-29 MED ORDER — LEVOFLOXACIN 500 MG PO TABS: 500.0000 mg | ORAL_TABLET | Freq: Every day | ORAL | Status: DC

## 2012-07-29 MED ORDER — BENZONATATE 200 MG PO CAPS: 200.0000 mg | ORAL_CAPSULE | Freq: Three times a day (TID) | ORAL | Status: DC | PRN

## 2012-07-29 MED ORDER — HYDROCODONE-HOMATROPINE 5-1.5 MG/5ML PO SYRP: 5.0000 mL | ORAL_SOLUTION | Freq: Three times a day (TID) | ORAL | Status: DC | PRN

## 2012-07-29 MED ORDER — HYDROCOD POLST-CHLORPHEN POLST 10-8 MG/5ML PO LQCR: 5.0000 mL | Freq: Two times a day (BID) | ORAL | Status: DC | PRN

## 2012-07-29 NOTE — Progress Notes (Signed)
Subjective:    Patient ID: Veronica Greene, female    DOB: 14-Jun-1946, 67 y.o.   MRN: [id number]  HPI Here with uri symptoms  Family is sick too   Sick for a week  Bad cough - hacking at first , now more productive and wet sounding - a little yellow phlegm If any fever- low grade , was a little achey last week   Stuffy nose and taking mucinex for that  Some mild ST   Some headache/ sinus pressure   Had a sinus infx in nov- given levaquin by her ENT and got better from that  Hx of frequent sinusitis and worried about that      Patient Active Problem List  Diagnosis  . POLIOMYELITIS  . OBESITY  . ANXIETY  . DEPRESSION  . ALLERGIC RHINITIS  . GERD  . IBS  . OVERACTIVE BLADDER  . OSTEOARTHRITIS, HANDS, BILATERAL  . FIBROMYALGIA  . OSTEOPENIA  . DIZZINESS OR VERTIGO  . EDEMA, CHRONIC  . DYSURIA  . URINARY INCONTINENCE  . OTHER NONSPECIFIC FINDING EXAMINATION OF URINE  . HEMATURIA, HX OF  . GOUT, UNSPECIFIED  . DERMATITIS, SEBORRHEIC  . CONTACT DERMATITIS  . Personal history of colonic polyps  . Constipation, slow transit  . Benign neoplasm of colon  . Melanosis coli  . Hypokalemia  . Screening for lipoid disorders  . Degenerative disk disease  . Other screening mammogram   Past Medical History  Diagnosis Date  . Allergy     allergic rhinitis  . GERD (gastroesophageal reflux disease)   . Anxiety   . Hypertension   . Depression   . Dizziness   . Urinary incontinence   . Osteopenia   . Cataracts, bilateral     early cataracts  . Gout   . Sinusitis, chronic   . IBS (irritable bowel syndrome)     with constipation  . Arthritis    Past Surgical History  Procedure Date  . Hand surgery     Bil  . Carpal tunnel release     Bil  . Thumb arthroscopy     Bil/  . Tonsillectomy     67 years old  . Colonoscopy   . Polio surg   . Ankle surgery     right  . Polypectomy   . Nasal sinus surgery 1976  . Knee surgery 1998    x2   History  Substance Use  Topics  . Smoking status: Never Smoker   . Smokeless tobacco: Never Used  . Alcohol Use: No   Family History  Problem Relation Age of Onset  . Kidney disease Mother   . Stroke Father   . Colon cancer Father   . Kidney disease Sister   . Cancer Paternal Aunt     COLON CA   Allergies  Allergen Reactions  . Clarithromycin     REACTION: throat swells  . Codeine     REACTION: nausea and vomiting  . Paroxetine     REACTION: doesn't work  . Penicillins     REACTION: whelps   Current Outpatient Prescriptions on File Prior to Visit  Medication Sig Dispense Refill  . Ascorbic Acid (VITAMIN C) 1000 MG tablet Take 1,000 mg by mouth daily.        Marland Kitchen aspirin 81 MG EC tablet Take 81 mg by mouth daily.        . Calcium Carbonate-Vitamin D (CALCIUM-VITAMIN D) 500-200 MG-UNIT per tablet Take 1 tablet by mouth daily.  Take 400 units daily       . colchicine 0.6 MG tablet Take 0.6 mg by mouth as needed.      . cyclobenzaprine (FLEXERIL) 10 MG tablet Take 1 tablet by mouth Once daily as needed. Muscle relaxer      . esomeprazole (NEXIUM) 40 MG capsule Take 1 capsule (40 mg total) by mouth daily before breakfast.  30 capsule  0  . ESTRACE VAGINAL 0.1 MG/GM vaginal cream       . guaiFENesin (MUCINEX) 600 MG 12 hr tablet Take 1,200 mg by mouth as needed.        . hydrochlorothiazide (HYDRODIURIL) 25 MG tablet Take 1 tablet (25 mg total) by mouth daily.  90 tablet  3  . lubiprostone (AMITIZA) 24 MCG capsule take 1 capsule by mouth twice a day  32 capsule  0  . Misc Natural Products (OSTEO BI-FLEX JOINT SHIELD PO) Take by mouth daily.        . mometasone (NASONEX) 50 MCG/ACT nasal spray Place 2 sprays into the nose daily.  51 g  3  . Multiple Vitamin (MULTIVITAMIN) tablet Take 1 tablet by mouth daily.        . potassium chloride (KLOR-CON 10) 10 MEQ tablet Take 2 tablets (20 mEq total) by mouth 2 (two) times daily.  360 tablet  3  . venlafaxine (EFFEXOR-XR) 75 MG 24 hr capsule Take 1 capsule (75 mg  total) by mouth daily.  90 capsule  3    Review of Systems Review of Systems  Constitutional: Negative for appetite change,  and unexpected weight change.  ENT pos for cong and rhinorrhea/ st and mild sinus pain  Eyes: Negative for pain and visual disturbance.  Respiratory: Negative for wheeze  and shortness of breath.   Cardiovascular: Negative for cp or palpitations    Gastrointestinal: Negative for nausea, diarrhea and constipation.  Genitourinary: Negative for urgency and frequency.  Skin: Negative for pallor or rash   Neurological: Negative for weakness, light-headedness, numbness and headaches.  Hematological: Negative for adenopathy. Does not bruise/bleed easily.  Psychiatric/Behavioral: Negative for dysphoric mood. The patient is not nervous/anxious.         Objective:   Physical Exam  Constitutional: She appears well-developed and well-nourished. No distress.       overwt and well appearing   HENT:  Head: Normocephalic and atraumatic.  Right Ear: External ear normal.  Left Ear: External ear normal.  Mouth/Throat: Oropharynx is clear and moist. No oropharyngeal exudate.       Nares are injected and congested  Clear rhinorrhea No sinus tenderness  Eyes: Conjunctivae normal and EOM are normal. Pupils are equal, round, and reactive to light. Right eye exhibits no discharge. Left eye exhibits no discharge.  Neck: Normal range of motion. Neck supple.  Cardiovascular: Normal rate and regular rhythm.   Pulmonary/Chest: Effort normal and breath sounds normal. No respiratory distress. She has no wheezes.  Lymphadenopathy:    She has no cervical adenopathy.  Neurological: She is alert. She has normal reflexes. No cranial nerve deficit.  Skin: Skin is warm and dry. No rash noted.  Psychiatric: She has a normal mood and affect.          Assessment & Plan:

## 2012-07-29 NOTE — Telephone Encounter (Signed)
[facility] called and said that Tussionex rx is not covered by insurance and is too expensive for pt to pay for.  They are requesting alternative medication.

## 2012-07-29 NOTE — Telephone Encounter (Signed)
Please call in px for hycodan- hopefully this may be cheaper

## 2012-07-29 NOTE — Telephone Encounter (Signed)
Called in new Rx as prescribed

## 2012-07-29 NOTE — Patient Instructions (Addendum)
Drink lots of fluids and get rest Try tessalon for day - cough control  tussionex for night- very sedating - use caution  If worse/ sinus pain/ fever/ etc- fill levaquin and take as directed  Update if not starting to improve in a week or if worsening

## 2012-07-29 NOTE — Assessment & Plan Note (Signed)
Will tx symptomatically with tessalon and tussionex Disc symptomatic care - see instructions on AVS If worse sinus pain - pt with sinus hx - will fill levaquin as directed Reassuring exam today Update if not starting to improve in a week or if worsening

## 2012-08-05 ENCOUNTER — Telehealth: Payer: Self-pay | Admitting: Family Medicine

## 2012-08-05 MED ORDER — LEVOFLOXACIN 500 MG PO TABS: 500.0000 mg | ORAL_TABLET | Freq: Every day | ORAL | Status: DC

## 2012-08-05 NOTE — Telephone Encounter (Signed)
Let her know the new recommendation for sinusitis is 7 days of tx instead of 10- but if she thinks she is not improving we can go ahead and extend course Will send 3 more days of levaquin to her pharmacy  If no further imp please f/u

## 2012-08-05 NOTE — Telephone Encounter (Signed)
Patient advised.

## 2012-08-05 NOTE — Telephone Encounter (Signed)
Patient Information:  Caller Name: Veronica Greene  Phone: [phone]  Patient: Veronica, Greene  Gender: Female  DOB: 1946/01/17  Age: 67 Years  PCP: Veronica Greene [facility])  Office Follow Up:  Does the office need to follow up with this patient?: Yes  Instructions For The Office: Please note request for additional antibiotic without appointment.  RN Note:  Feels better physically but nasal congestion and cough not clear. Called from work.  Headache rated moderate, 5/10 with pain in right eyeball. Has not been using Hycodan due to fearful it will make her drowsy for work. Mild swelling present around eyes, especially in the morning.  Concerned 7 days of antibiotic was not enought since MD always ordered Rx "for at least 10 days" in the past.  Educated MD orders are must be evaluated for possible antibiotic. Declined appointment for 08/05/12.  Will see how does over next few days and call back if not better.  Symptoms  Reason For Call & Symptoms: Ongong "nasty, raspy" cough with wheezing. Finished antibiotic for sinus infection.  Asking if needs more antibiotic.  Reviewed Health History In EMR: Yes  Reviewed Medications In EMR: Yes  Reviewed Allergies In EMR: Yes  Reviewed Surgeries / Procedures: Yes  Date of Onset of Symptoms: 07/24/2012  Treatments Tried: Hycodan, nasal saline, Mucinex  Treatments Tried Worked: Yes  Guideline(s) Used:  Sinus Pain and Congestion  Disposition Per Guideline:   Go to Office Now  Reason For Disposition Reached:   Redness or swelling on the cheek, forehead, or around the eye  Advice Given:  Hydration:  Drink plenty of liquids (6-8 glasses of water daily). If the air in your home is dry, use a cool mist humidifier  Call Back If:   Severe pain lasts longer than 2 hours after pain medicine  Sinus congestion (fullness) lasts longer than 10 days  Fever lasts longer than 3 days  You become worse.  Patient Refused Recommendation:  Patient Will Follow Up  With Office Later  Declined appointment; hoping MD will make antibiotic exception.

## 2012-09-02 ENCOUNTER — Other Ambulatory Visit: Payer: Self-pay

## 2012-09-02 ENCOUNTER — Encounter: Payer: Self-pay | Admitting: Family Medicine

## 2012-09-02 ENCOUNTER — Ambulatory Visit (INDEPENDENT_AMBULATORY_CARE_PROVIDER_SITE_OTHER): Payer: Medicare Other | Admitting: Family Medicine

## 2012-09-02 VITALS — BP 132/72 | HR 66 | Temp 98.5°F | Ht 65.0 in | Wt 190.8 lb

## 2012-09-02 DIAGNOSIS — R609 Edema, unspecified: Secondary | ICD-10-CM

## 2012-09-02 DIAGNOSIS — B9689 Other specified bacterial agents as the cause of diseases classified elsewhere: Secondary | ICD-10-CM | POA: Insufficient documentation

## 2012-09-02 DIAGNOSIS — M949 Disorder of cartilage, unspecified: Secondary | ICD-10-CM

## 2012-09-02 DIAGNOSIS — J019 Acute sinusitis, unspecified: Secondary | ICD-10-CM

## 2012-09-02 DIAGNOSIS — M899 Disorder of bone, unspecified: Secondary | ICD-10-CM

## 2012-09-02 DIAGNOSIS — E876 Hypokalemia: Secondary | ICD-10-CM

## 2012-09-02 DIAGNOSIS — E785 Hyperlipidemia, unspecified: Secondary | ICD-10-CM

## 2012-09-02 HISTORY — DX: Hyperlipidemia, unspecified: E78.5

## 2012-09-02 LAB — COMPREHENSIVE METABOLIC PANEL
Albumin: 4.2 g/dL (ref 3.5–5.2)
BUN: 10 mg/dL (ref 6–23)
CO2: 29 mEq/L (ref 19–32)
GFR: 110.04 mL/min (ref >60.00)
Glucose, Bld: 93 mg/dL (ref 70–99)
Potassium: 3.8 mEq/L (ref 3.5–5.1)
Sodium: 139 mEq/L (ref 135–145)
Total Bilirubin: 0.7 mg/dL (ref 0.3–1.2)
Total Protein: 6.9 g/dL (ref 6.0–8.3)

## 2012-09-02 LAB — LIPID PANEL
Cholesterol: 183 mg/dL (ref 0–200)
Triglycerides: 136 mg/dL (ref 0.0–149.0)

## 2012-09-02 MED ORDER — CYCLOBENZAPRINE HCL 10 MG PO TABS: 10.0000 mg | ORAL_TABLET | Freq: Every day | ORAL | Status: DC | PRN

## 2012-09-02 MED ORDER — LEVOFLOXACIN 500 MG PO TABS: 500.0000 mg | ORAL_TABLET | Freq: Every day | ORAL | Status: DC

## 2012-09-02 NOTE — Telephone Encounter (Signed)
Will refill electronically  

## 2012-09-02 NOTE — Assessment & Plan Note (Signed)
S/p uri with sinus pain and purulent nasal d/c tx with levaquin Disc symptomatic care - see instructions on AVS  Update if not starting to improve in a week or if worsening

## 2012-09-02 NOTE — Assessment & Plan Note (Signed)
Seems to be better in cooler weather cmet today

## 2012-09-02 NOTE — Assessment & Plan Note (Signed)
Labs today No cramping  Balanced diet

## 2012-09-02 NOTE — Patient Instructions (Addendum)
Labs today Stay active and work on weight loss  Remember fall precautions Take levaquin for sinus infection (Update if not starting to improve in a week or if worsening  )

## 2012-09-02 NOTE — Assessment & Plan Note (Signed)
Reassuring dexa last time with bmd in nl range Check D level today Disc fall risk and fx risk today - disc safety

## 2012-09-02 NOTE — Progress Notes (Signed)
Subjective:    Patient ID: Veronica Greene, female    DOB: 08/01/1945, 67 y.o.   MRN: [id number]  HPI Here for check up of chronic medical conditions and to review health mt list   Still coughing  Still has sinus problems and sinus headache and pressure (a little bit of yellow d/c) and R side of nose burns Taking mucinex  Going on since jan   Otherwise nothing new   Wt is up 2 lb   Flu vaccine-got that in nov  mammo 10/13 Self exam- no lumps or changes   Gyn pap 8/13- all was ok at her gyn  Openia hx but dexa 10/13 in nl range D level is 60  Lipid Lab Results  Component Value Date   CHOL 185 08/31/2011   CHOL 182 08/25/2010   CHOL 166 08/19/2009   Lab Results  Component Value Date   HDL 46.00 08/31/2011   HDL 18.84 08/25/2010   HDL 16.60 08/19/2009   Lab Results  Component Value Date   LDLCALC 114* 08/31/2011   LDLCALC 107* 08/25/2010   LDLCALC 94 08/19/2009   Lab Results  Component Value Date   TRIG 124.0 08/31/2011   TRIG 105.0 08/25/2010   TRIG 149.0 08/19/2009   Lab Results  Component Value Date   CHOLHDL 4 08/31/2011   CHOLHDL 3 08/25/2010   CHOLHDL 4 08/19/2009   No results found for this basename: LDLDIRECT    Falls had one fall last week - walking around the park- tripped (wears a shoe rise) She needs to be more careful  Has been doing pretty well    Patient Active Problem List  Diagnosis  . POLIOMYELITIS  . OBESITY  . ANXIETY  . DEPRESSION  . ALLERGIC RHINITIS  . GERD  . IBS  . OVERACTIVE BLADDER  . OSTEOARTHRITIS, HANDS, BILATERAL  . FIBROMYALGIA  . OSTEOPENIA  . DIZZINESS OR VERTIGO  . EDEMA, CHRONIC  . DYSURIA  . URINARY INCONTINENCE  . OTHER NONSPECIFIC FINDING EXAMINATION OF URINE  . HEMATURIA, HX OF  . GOUT, UNSPECIFIED  . DERMATITIS, SEBORRHEIC  . CONTACT DERMATITIS  . Personal history of colonic polyps  . Constipation, slow transit  . Benign neoplasm of colon  . Melanosis coli  . Hypokalemia  . Screening for lipoid disorders   . Degenerative disk disease  . Other screening mammogram  . Viral URI with cough   Past Medical History  Diagnosis Date  . Allergy     allergic rhinitis  . GERD (gastroesophageal reflux disease)   . Anxiety   . Hypertension   . Depression   . Dizziness   . Urinary incontinence   . Osteopenia   . Cataracts, bilateral     early cataracts  . Gout   . Sinusitis, chronic   . IBS (irritable bowel syndrome)     with constipation  . Arthritis    Past Surgical History  Procedure Laterality Date  . Hand surgery      Bil  . Carpal tunnel release      Bil  . Thumb arthroscopy      Bil/  . Tonsillectomy      67 years old  . Colonoscopy    . Polio surg    . Ankle surgery      right  . Polypectomy    . Nasal sinus surgery  1976  . Knee surgery  1998    x2   History  Substance Use Topics  . Smoking  status: Never Smoker   . Smokeless tobacco: Never Used  . Alcohol Use: No   Family History  Problem Relation Age of Onset  . Kidney disease Mother   . Stroke Father   . Colon cancer Father   . Kidney disease Sister   . Cancer Paternal Aunt     COLON CA   Allergies  Allergen Reactions  . Clarithromycin     REACTION: throat swells  . Codeine     REACTION: nausea and vomiting  . Paroxetine     REACTION: doesn't work  . Penicillins     REACTION: whelps   Current Outpatient Prescriptions on File Prior to Visit  Medication Sig Dispense Refill  . Ascorbic Acid (VITAMIN C) 1000 MG tablet Take 1,000 mg by mouth daily.        Marland Kitchen aspirin 81 MG EC tablet Take 81 mg by mouth daily.        . benzonatate (TESSALON) 200 MG capsule Take 1 capsule (200 mg total) by mouth 3 (three) times daily as needed for cough (swallow whole).  30 capsule  0  . Calcium Carbonate-Vitamin D (CALCIUM-VITAMIN D) 500-200 MG-UNIT per tablet Take 1 tablet by mouth daily. Take 400 units daily       . chlorpheniramine-HYDROcodone (TUSSIONEX PENNKINETIC ER) 10-8 MG/5ML LQCR Take 5 mLs by mouth every 12  (twelve) hours as needed.  140 mL  0  . colchicine 0.6 MG tablet Take 0.6 mg by mouth as needed.      . cyclobenzaprine (FLEXERIL) 10 MG tablet Take 1 tablet by mouth Once daily as needed. Muscle relaxer      . esomeprazole (NEXIUM) 40 MG capsule Take 1 capsule (40 mg total) by mouth daily before breakfast.  30 capsule  0  . ESTRACE VAGINAL 0.1 MG/GM vaginal cream       . guaiFENesin (MUCINEX) 600 MG 12 hr tablet Take 1,200 mg by mouth as needed.        . hydrochlorothiazide (HYDRODIURIL) 25 MG tablet Take 1 tablet (25 mg total) by mouth daily.  90 tablet  3  . HYDROcodone-homatropine (HYCODAN) 5-1.5 MG/5ML syrup Take 5 mLs by mouth every 8 (eight) hours as needed for cough.  120 mL  0  . levofloxacin (LEVAQUIN) 500 MG tablet Take 1 tablet (500 mg total) by mouth daily.  3 tablet  0  . lubiprostone (AMITIZA) 24 MCG capsule take 1 capsule by mouth twice a day  32 capsule  0  . meloxicam (MOBIC) 7.5 MG tablet       . Misc Natural Products (OSTEO BI-FLEX JOINT SHIELD PO) Take by mouth daily.        . mometasone (NASONEX) 50 MCG/ACT nasal spray Place 2 sprays into the nose daily.  51 g  3  . Multiple Vitamin (MULTIVITAMIN) tablet Take 1 tablet by mouth daily.        . potassium chloride (KLOR-CON 10) 10 MEQ tablet Take 2 tablets (20 mEq total) by mouth 2 (two) times daily.  360 tablet  3  . venlafaxine (EFFEXOR-XR) 75 MG 24 hr capsule Take 1 capsule (75 mg total) by mouth daily.  90 capsule  3   No current facility-administered medications on file prior to visit.    Review of Systems Review of Systems  Constitutional: Negative for fever, appetite change,  and unexpected weight change.  ENT pos for congestion and sinus pain/ neg for ear pain or drainage  Eyes: Negative for pain and visual disturbance.  Respiratory: Negative for cough and shortness of breath.   Cardiovascular: Negative for cp or palpitations    Gastrointestinal: Negative for nausea, diarrhea and constipation.  Genitourinary:  Negative for urgency and frequency.  Skin: Negative for pallor or rash   Neurological: Negative for weakness, light-headedness, numbness and headaches.  Hematological: Negative for adenopathy. Does not bruise/bleed easily.  Psychiatric/Behavioral: Negative for dysphoric mood. The patient is not nervous/anxious.         Objective:   Physical Exam  Constitutional: She appears well-developed and well-nourished. No distress.  obese and well appearing   HENT:  Head: Normocephalic and atraumatic.  Right Ear: External ear normal.  Left Ear: External ear normal.  Mouth/Throat: Oropharynx is clear and moist.  Nares are injected and congested  Bilateral maxillary sinus tenderness  Eyes: Conjunctivae and EOM are normal. Pupils are equal, round, and reactive to light. Right eye exhibits no discharge. Left eye exhibits no discharge. No scleral icterus.  Neck: Normal range of motion. Neck supple. No JVD present. Carotid bruit is not present. No thyromegaly present.  Cardiovascular: Normal rate, regular rhythm, normal heart sounds and intact distal pulses.  Exam reveals no gallop.   Pulmonary/Chest: Breath sounds normal. No respiratory distress. She has no wheezes.  Abdominal: Soft. Bowel sounds are normal. She exhibits no distension and no abdominal bruit. There is no tenderness.  Musculoskeletal: She exhibits no edema and no tenderness.  One shoe is built up for leg length discrepency- with a stable gait  Lymphadenopathy:    She has no cervical adenopathy.  Neurological: She is alert. She has normal reflexes. No cranial nerve deficit. Coordination normal.  Skin: Skin is warm and dry. No rash noted. No erythema. No pallor.  Psychiatric: She has a normal mood and affect.          Assessment & Plan:

## 2012-09-02 NOTE — Telephone Encounter (Signed)
Pt request refill cyclobenzaprine to [facility].Please advise.

## 2012-09-02 NOTE — Assessment & Plan Note (Signed)
Check lipids today Rev low sat fat diet and goals to reduce CV risks

## 2012-09-03 ENCOUNTER — Encounter: Payer: Self-pay | Admitting: *Deleted

## 2012-09-26 ENCOUNTER — Other Ambulatory Visit: Payer: Self-pay | Admitting: *Deleted

## 2012-09-26 ENCOUNTER — Other Ambulatory Visit: Payer: Self-pay | Admitting: Family Medicine

## 2012-10-30 ENCOUNTER — Ambulatory Visit (INDEPENDENT_AMBULATORY_CARE_PROVIDER_SITE_OTHER): Payer: Medicare Other | Admitting: Family Medicine

## 2012-10-30 ENCOUNTER — Encounter: Payer: Self-pay | Admitting: Family Medicine

## 2012-10-30 VITALS — BP 120/70 | HR 69 | Temp 98.2°F | Ht 65.0 in | Wt 189.0 lb

## 2012-10-30 DIAGNOSIS — L259 Unspecified contact dermatitis, unspecified cause: Secondary | ICD-10-CM | POA: Insufficient documentation

## 2012-10-30 MED ORDER — PREDNISONE 20 MG PO TABS: ORAL_TABLET | ORAL | Status: DC

## 2012-10-30 NOTE — Patient Instructions (Signed)
Start prednisone taper. Start claritin/zyrtec daily. Call and let us now if rash is not improving in 1-2 week.

## 2012-10-30 NOTE — Assessment & Plan Note (Addendum)
Treta with steroid taper and antihistamine. Consider possible contact irritants.. 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

## 2012-10-30 NOTE — Progress Notes (Signed)
  Subjective:    Patient ID: Veronica Greene, female    DOB: Nov 19, 1945, 67 y.o.   MRN: [id number]  Rash This is a new problem. The current episode started 1 to 4 weeks ago (noted lesion on left upper arm 4/10, improved with OTC cortisone cream, since then areas have appeared on legs arms and neck). The problem has been gradually worsening since onset. The affected locations include the left arm, right arm, right lower leg and neck (not in outh or soles/palms). The rash is characterized by burning and itchiness. 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 Associated symptoms include congestion and fatigue. Pertinent negatives include no cough, eye pain, joint pain, shortness of breath or sore throat. Past treatments include topical steroids. The treatment provided mild relief. Her past medical history is significant for allergies. There is no history of asthma, eczema or varicella.      Review of Systems  Constitutional: Positive for fatigue.  HENT: Positive for congestion. Negative for sore throat.   Eyes: Negative for pain.  Respiratory: Negative for cough and shortness of breath.   Musculoskeletal: Negative for joint pain.  Skin: Positive for rash.       Objective:   Physical Exam  Constitutional: Vital signs are normal. She appears well-developed and well-nourished. She is cooperative.  Non-toxic appearance. She does not appear ill. No distress.  HENT:  Head: Normocephalic.  Right Ear: Hearing, tympanic membrane, external ear and ear canal normal. Tympanic membrane is not erythematous, not retracted and not bulging.  Left Ear: Hearing, tympanic membrane, external ear and ear canal normal. Tympanic membrane is not erythematous, not retracted and not bulging.  Nose: No mucosal edema or rhinorrhea. Right sinus exhibits no maxillary sinus tenderness and no frontal sinus tenderness. Left sinus exhibits no maxillary sinus tenderness and no frontal sinus tenderness.   Mouth/Throat: Uvula is midline, oropharynx is clear and moist and mucous membranes are normal.  Eyes: Conjunctivae, EOM and lids are normal. Pupils are equal, round, and reactive to light. No foreign bodies found.  Neck: Trachea normal and normal range of motion. Neck supple. Carotid bruit is not present. No mass and no thyromegaly present.  Cardiovascular: Normal rate, regular rhythm, S1 normal, S2 normal, normal heart sounds, intact distal pulses and normal pulses.  Exam reveals no gallop and no friction rub.   No murmur heard. Pulmonary/Chest: Effort normal and breath sounds normal. Not tachypneic. No respiratory distress. She has no decreased breath sounds. She has no wheezes. She has no rhonchi. She has no rales.  Abdominal: Soft. Normal appearance and bowel sounds are normal. There is no tenderness.  Neurological: She is alert.  Skin: Skin is warm, dry and intact. Rash noted. Rash is vesicular.  On arms and neck and right leg... Several streaks of rash in excoriation pattern... Linear with vesicles  Psychiatric: Her speech is normal and behavior is normal. Judgment and thought content normal. Her mood appears not anxious. Cognition and memory are normal. She does not exhibit a depressed mood.          Assessment & Plan:

## 2012-11-11 ENCOUNTER — Other Ambulatory Visit: Payer: Self-pay | Admitting: Family Medicine

## 2012-12-04 ENCOUNTER — Encounter: Payer: Self-pay | Admitting: Family Medicine

## 2012-12-04 ENCOUNTER — Ambulatory Visit (INDEPENDENT_AMBULATORY_CARE_PROVIDER_SITE_OTHER): Payer: Medicare Other | Admitting: Family Medicine

## 2012-12-04 VITALS — BP 120/70 | HR 76 | Temp 98.3°F | Ht 65.0 in | Wt 190.5 lb

## 2012-12-04 DIAGNOSIS — J069 Acute upper respiratory infection, unspecified: Secondary | ICD-10-CM

## 2012-12-04 DIAGNOSIS — J019 Acute sinusitis, unspecified: Secondary | ICD-10-CM

## 2012-12-04 DIAGNOSIS — B029 Zoster without complications: Secondary | ICD-10-CM

## 2012-12-04 MED ORDER — VALACYCLOVIR HCL 500 MG PO TABS: 1000.0000 mg | ORAL_TABLET | Freq: Three times a day (TID) | ORAL | Status: DC

## 2012-12-04 NOTE — Progress Notes (Signed)
[facility] conservation officer at [facility] [address] [place] [address] Phone: [phone] Fax: [phone]  Date:  12/04/2012   Name:  Veronica Greene   DOB:  Oct 30, 1945   MRN:  [id number] Gender: female Age: 67 y.o.  Primary Physician:  Roxy Manns, MD  Evaluating MD: Hannah Beat, MD   Chief Complaint: Sore Throat and Rash   History of Present Illness:  Veronica Greene is a 67 y.o. pleasant patient who presents with the following:  URI sx > 1 week and now with sinus pain, plus yesterday, developed severely tender rash on R anterior chest wall. Very painful. Runny nose and congestion.  R anterior chest, shingles.  Cannot hardly feel something to touch it. Husband has illness right now, too.  Had shingles vaccine.  No n/v/d   Patient Active Problem List   Diagnosis Date Noted  . Contact dermatitis 10/30/2012  . Hyperlipidemia, mild 09/02/2012  . Acute bacterial sinusitis 09/02/2012  . Other screening mammogram 12/12/2011  . Screening for lipoid disorders 08/31/2011  . Degenerative disk disease 08/31/2011  . Hypokalemia 01/02/2011  . Personal history of colonic polyps 11/03/2010  . Constipation, slow transit 11/03/2010  . Benign neoplasm of colon 11/03/2010  . Melanosis coli 11/03/2010  . GOUT, UNSPECIFIED 08/25/2010  . DERMATITIS, SEBORRHEIC 08/25/2010  . OSTEOPENIA 09/05/2007  . POLIOMYELITIS 08/26/2007  . OBESITY 08/26/2007  . ANXIETY 08/26/2007  . DEPRESSION 08/26/2007  . ALLERGIC RHINITIS 08/26/2007  . GERD 08/26/2007  . IBS 08/26/2007  . OVERACTIVE BLADDER 08/26/2007  . OSTEOARTHRITIS, HANDS, BILATERAL 08/26/2007  . FIBROMYALGIA 08/26/2007  . DIZZINESS OR VERTIGO 08/26/2007  . EDEMA, CHRONIC 08/26/2007  . URINARY INCONTINENCE 08/26/2007  . HEMATURIA, HX OF 08/26/2007    Past Medical History  Diagnosis Date  . Allergy     allergic rhinitis  . GERD (gastroesophageal reflux disease)   . Anxiety   . Hypertension   . Depression   .  Dizziness   . Urinary incontinence   . Osteopenia   . Cataracts, bilateral     early cataracts  . Gout   . Sinusitis, chronic   . IBS (irritable bowel syndrome)     with constipation  . Arthritis     Past Surgical History  Procedure Laterality Date  . Hand surgery      Bil  . Carpal tunnel release      Bil  . Thumb arthroscopy      Bil/  . Tonsillectomy      67 years old  . Colonoscopy    . Polio surg    . Ankle surgery      right  . Polypectomy    . Nasal sinus surgery  1976  . Knee surgery  1998    x2    History   Social History  . Marital Status: Widowed    Spouse Name: N/A    Number of Children: N/A  . Years of Education: N/A   Occupational History  . Not on file.   Social History Main Topics  . Smoking status: Never Smoker   . Smokeless tobacco: Never Used  . Alcohol Use: No  . Drug Use: No  . Sexually Active: Not on file   Other Topics Concern  . Not on file   Social History Narrative  . No narrative on file    Family History  Problem Relation Age of Onset  . Kidney disease Mother   . Stroke Father   . Colon  cancer Father   . Kidney disease Sister   . Cancer Paternal Aunt     COLON CA    Allergies  Allergen Reactions  . Clarithromycin     REACTION: throat swells  . Codeine     REACTION: nausea and vomiting  . Paroxetine     REACTION: doesn't work  . Penicillins     REACTION: whelps    Medication list has been reviewed and updated.  Outpatient Prescriptions Prior to Visit  Medication Sig Dispense Refill  . Ascorbic Acid (VITAMIN C) 1000 MG tablet Take 1,000 mg by mouth daily.        Marland Kitchen aspirin 81 MG EC tablet Take 81 mg by mouth daily.        . Calcium Carbonate-Vitamin D (CALCIUM-VITAMIN D) 500-200 MG-UNIT per tablet Take 1 tablet by mouth daily. Take 400 units daily       . colchicine 0.6 MG tablet Take 0.6 mg by mouth as needed.      Marland Kitchen esomeprazole (NEXIUM) 40 MG capsule Take 1 capsule (40 mg total) by mouth daily before  breakfast.  30 capsule  0  . guaiFENesin (MUCINEX) 600 MG 12 hr tablet Take 1,200 mg by mouth as needed.        . hydrochlorothiazide (HYDRODIURIL) 25 MG tablet TAKE 1 TABLET BY MOUTH ONCE DAILY.  90 tablet  1  . lubiprostone (AMITIZA) 24 MCG capsule take 1 capsule by mouth twice a day  32 capsule  0  . meloxicam (MOBIC) 7.5 MG tablet TAKE 1 TABLET BY MOUTH DAILY WITH FOOD.  90 tablet  0  . Misc Natural Products (OSTEO BI-FLEX JOINT SHIELD PO) Take by mouth daily.        . mometasone (NASONEX) 50 MCG/ACT nasal spray Place 2 sprays into the nose daily.  51 g  3  . Multiple Vitamin (MULTIVITAMIN) tablet Take 1 tablet by mouth daily.        . potassium chloride (K-DUR) 10 MEQ tablet TAKE 2 TABLETS BY MOUTH TWICE A DAY.  360 tablet  3  . venlafaxine XR (EFFEXOR-XR) 75 MG 24 hr capsule TAKE 1 CAPSULE BY MOUTH ONCE DAILY.  90 capsule  1  . cyclobenzaprine (FLEXERIL) 10 MG tablet Take 1 tablet (10 mg total) by mouth daily as needed for muscle spasms. Muscle relaxer  30 tablet  3  . predniSONE (DELTASONE) 20 MG tablet 3 tabs by mouth daily x 3 days, then 2 tabs by mouth daily x 2 days then 1 tab by mouth daily x 2 days  15 tablet  0   No facility-administered medications prior to visit.    Review of Systems:  ROS: GEN: Acute illness details above GI: Tolerating PO intake GU: maintaining adequate hydration and urination Pulm: No SOB Interactive and getting along well at home.  Otherwise, ROS is as per the HPI.   Physical Examination: BP 120/70  Pulse 76  Temp(Src) 98.3 F (36.8 C) (Oral)  Ht 5\' 5"  (1.651 m)  Wt 190 lb 8 oz (86.41 kg)  BMI 31.7 kg/m2  SpO2 97%  Ideal Body Weight: Weight in (lb) to have BMI = 25: 149.9   Gen: WDWN, NAD; alert,appropriate and cooperative throughout exam  HEENT: Normocephalic and atraumatic. Throat clear, w/o exudate, no LAD, R TM clear, L TM - good landmarks, No fluid present. rhinnorhea.  Left frontal and maxillary sinuses: Tender Right frontal and  maxillary sinuses: Tender  Neck: No ant or post LAD CV: RRR, No M/G/R  Pulm: Breathing comfortably in no resp distress. no w/c/r Abd: S,NT,ND,+BS Extr: no c/c/e Psych: full affect, pleasant  SKIN: vesicular rash, r anterior chest wall  Assessment and Plan:  Herpes zoster  URI (upper respiratory infection)  Sinusitis, acute  URI sx - suggested supportive care. Fluids Valtrex for shingles.  Patient later called in 1 day after our encounter - now on LVQ.  Orders Today:  No orders of the defined types were placed in this encounter.    Updated Medication List: (Includes new medications, updates to list, dose adjustments) Meds ordered this encounter  Medications  . valACYclovir (VALTREX) 500 MG tablet    Sig: Take 2 tablets (1,000 mg total) by mouth 3 (three) times daily.    Dispense:  42 tablet    Refill:  0    Medications Discontinued: Medications Discontinued During This Encounter  Medication Reason  . predniSONE (DELTASONE) 20 MG tablet Error  . cyclobenzaprine (FLEXERIL) 10 MG tablet Error      Signed, Eloyse Causey T. Matai Carpenito, MD 12/04/2012 3:30 PM

## 2012-12-05 ENCOUNTER — Telehealth: Payer: Self-pay

## 2012-12-05 MED ORDER — LEVOFLOXACIN 500 MG PO TABS: 500.0000 mg | ORAL_TABLET | Freq: Every day | ORAL | Status: DC

## 2012-12-05 NOTE — Telephone Encounter (Signed)
Note not compelted yet by Dr. Patsy Lager but pt now with symtpoms >2 weeks.  Will treat with levaquin to cover for bacterial infection.

## 2012-12-05 NOTE — Telephone Encounter (Signed)
Patient advised.

## 2012-12-05 NOTE — Telephone Encounter (Signed)
Pt left v/m was seen 12/04/12 for shingles and cold symptoms; pt got med for shingles but cold is worse today and request med. Spoke with pt today no fever but productive cough with white phlegm tinted with yellow. Pt has congested head and runny nose. No SOB or difficulty breathing and no CP or wheezing.[facility]. Pt has taken Mucinex allergy daily for 2 weeks. Pt said Levaquin usually takes care of symptoms.Please advise.

## 2012-12-08 ENCOUNTER — Other Ambulatory Visit: Payer: Self-pay | Admitting: *Deleted

## 2012-12-08 MED ORDER — ESOMEPRAZOLE MAGNESIUM 40 MG PO CPDR: 40.0000 mg | DELAYED_RELEASE_CAPSULE | Freq: Every day | ORAL | Status: DC

## 2012-12-08 NOTE — Addendum Note (Signed)
Addended by: Baldomero Lamy on: 12/08/2012 01:55 PM   Modules accepted: Orders

## 2012-12-17 ENCOUNTER — Ambulatory Visit (INDEPENDENT_AMBULATORY_CARE_PROVIDER_SITE_OTHER): Payer: Medicare Other | Admitting: Family Medicine

## 2012-12-17 ENCOUNTER — Encounter: Payer: Self-pay | Admitting: Family Medicine

## 2012-12-17 VITALS — BP 144/78 | HR 69 | Temp 98.3°F | Ht 65.0 in | Wt 189.5 lb

## 2012-12-17 DIAGNOSIS — J019 Acute sinusitis, unspecified: Secondary | ICD-10-CM

## 2012-12-17 DIAGNOSIS — B9689 Other specified bacterial agents as the cause of diseases classified elsewhere: Secondary | ICD-10-CM

## 2012-12-17 MED ORDER — LEVOFLOXACIN 500 MG PO TABS: 500.0000 mg | ORAL_TABLET | Freq: Every day | ORAL | Status: DC

## 2012-12-17 NOTE — Progress Notes (Signed)
Subjective:    Patient ID: Veronica Greene, female    DOB: 1945/08/12, 67 y.o.   MRN: [id number]  HPI Here for uri symptoms  Was given levaquin for 5 d from Dr Ermalene Searing -finished that  2 weeks and not getting better Coughing and cannot stop - is keeping her up at night  Yellow phlegm  No wheeze/ but has nasal congestion  Face is hurting - above and below her eyes  Eye are bothering her too   Steffanie Rainwater was sick with uri and fever    Also getting over shingles - doing much better - had vaccine   Patient Active Problem List   Diagnosis Date Noted  . Contact dermatitis 10/30/2012  . Hyperlipidemia, mild 09/02/2012  . Acute bacterial sinusitis 09/02/2012  . Other screening mammogram 12/12/2011  . Screening for lipoid disorders 08/31/2011  . Degenerative disk disease 08/31/2011  . Hypokalemia 01/02/2011  . Personal history of colonic polyps 11/03/2010  . Constipation, slow transit 11/03/2010  . Benign neoplasm of colon 11/03/2010  . Melanosis coli 11/03/2010  . GOUT, UNSPECIFIED 08/25/2010  . DERMATITIS, SEBORRHEIC 08/25/2010  . OSTEOPENIA 09/05/2007  . POLIOMYELITIS 08/26/2007  . OBESITY 08/26/2007  . ANXIETY 08/26/2007  . DEPRESSION 08/26/2007  . ALLERGIC RHINITIS 08/26/2007  . GERD 08/26/2007  . IBS 08/26/2007  . OVERACTIVE BLADDER 08/26/2007  . OSTEOARTHRITIS, HANDS, BILATERAL 08/26/2007  . FIBROMYALGIA 08/26/2007  . DIZZINESS OR VERTIGO 08/26/2007  . EDEMA, CHRONIC 08/26/2007  . URINARY INCONTINENCE 08/26/2007  . HEMATURIA, HX OF 08/26/2007   Past Medical History  Diagnosis Date  . Allergy     allergic rhinitis  . GERD (gastroesophageal reflux disease)   . Anxiety   . Hypertension   . Depression   . Dizziness   . Urinary incontinence   . Osteopenia   . Cataracts, bilateral     early cataracts  . Gout   . Sinusitis, chronic   . IBS (irritable bowel syndrome)     with constipation  . Arthritis    Past Surgical History  Procedure Laterality Date  . Hand  surgery      Bil  . Carpal tunnel release      Bil  . Thumb arthroscopy      Bil/  . Tonsillectomy      67 years old  . Colonoscopy    . Polio surg    . Ankle surgery      right  . Polypectomy    . Nasal sinus surgery  1976  . Knee surgery  1998    x2   History  Substance Use Topics  . Smoking status: Never Smoker   . Smokeless tobacco: Never Used  . Alcohol Use: No   Family History  Problem Relation Age of Onset  . Kidney disease Mother   . Stroke Father   . Colon cancer Father   . Kidney disease Sister   . Cancer Paternal Aunt     COLON CA   Allergies  Allergen Reactions  . Clarithromycin     REACTION: throat swells  . Codeine     REACTION: nausea and vomiting  . Paroxetine     REACTION: doesn't work  . Penicillins     REACTION: whelps   Current Outpatient Prescriptions on File Prior to Visit  Medication Sig Dispense Refill  . Ascorbic Acid (VITAMIN C) 1000 MG tablet Take 1,000 mg by mouth daily.        Marland Kitchen aspirin 81 MG EC tablet Take  81 mg by mouth daily.        . Calcium Carbonate-Vitamin D (CALCIUM-VITAMIN D) 500-200 MG-UNIT per tablet Take 1 tablet by mouth daily. Take 400 units daily       . colchicine 0.6 MG tablet Take 0.6 mg by mouth as needed.      Marland Kitchen esomeprazole (NEXIUM) 40 MG capsule Take 1 capsule (40 mg total) by mouth daily before breakfast.  30 capsule  6  . guaiFENesin (MUCINEX) 600 MG 12 hr tablet Take 1,200 mg by mouth as needed.        . hydrochlorothiazide (HYDRODIURIL) 25 MG tablet TAKE 1 TABLET BY MOUTH ONCE DAILY.  90 tablet  1  . lubiprostone (AMITIZA) 24 MCG capsule take 1 capsule by mouth twice a day  32 capsule  0  . meloxicam (MOBIC) 7.5 MG tablet TAKE 1 TABLET BY MOUTH DAILY WITH FOOD.  90 tablet  0  . Misc Natural Products (OSTEO BI-FLEX JOINT SHIELD PO) Take by mouth daily.        . mometasone (NASONEX) 50 MCG/ACT nasal spray Place 2 sprays into the nose daily.  51 g  3  . Multiple Vitamin (MULTIVITAMIN) tablet Take 1 tablet by  mouth daily.        . potassium chloride (K-DUR) 10 MEQ tablet TAKE 2 TABLETS BY MOUTH TWICE A DAY.  360 tablet  3  . venlafaxine XR (EFFEXOR-XR) 75 MG 24 hr capsule TAKE 1 CAPSULE BY MOUTH ONCE DAILY.  90 capsule  1   No current facility-administered medications on file prior to visit.    Review of Systems Review of Systems  Constitutional: Negative for fever, appetite change,  and unexpected weight change.  ENT pos for cong/ rhinorrhea/ st /facial pain and pressure  Eyes: Negative for pain and visual disturbance.  Respiratory: Negative for wheeze  and shortness of breath.   Cardiovascular: Negative for cp or palpitations    Gastrointestinal: Negative for nausea, diarrhea and constipation.  Genitourinary: Negative for urgency and frequency.  Skin: Negative for pallor or rash   Neurological: Negative for weakness, light-headedness, numbness and headaches.  Hematological: Negative for adenopathy. Does not bruise/bleed easily.  Psychiatric/Behavioral: Negative for dysphoric mood. The patient is not nervous/anxious.         Objective:   Physical Exam  Constitutional: She appears well-developed and well-nourished. No distress.  HENT:  Head: Normocephalic and atraumatic.  Right Ear: External ear normal.  Left Ear: External ear normal.  Mouth/Throat: Oropharynx is clear and moist.  Nares are injected and congested  Mild maxillary sinus tenderness   Eyes: Conjunctivae and EOM are normal. Pupils are equal, round, and reactive to light. Right eye exhibits no discharge. Left eye exhibits no discharge. No scleral icterus.  Neck: Normal range of motion. Neck supple. No JVD present. Carotid bruit is not present. No thyromegaly present.  Cardiovascular: Normal rate, regular rhythm, normal heart sounds and intact distal pulses.  Exam reveals no gallop.   Pulmonary/Chest: Effort normal and breath sounds normal. No respiratory distress. She has no wheezes. She exhibits no tenderness.  Harsh bs/  few isolated rhonchi/ no rales    Abdominal: She exhibits no abdominal bruit.  Lymphadenopathy:    She has no cervical adenopathy.  Neurological: She is alert. She has normal reflexes. No cranial nerve deficit.  Skin: Skin is warm and dry. No rash noted. No erythema. No pallor.  Psychiatric: She has a normal mood and affect.  Assessment & Plan:

## 2012-12-17 NOTE — Patient Instructions (Addendum)
Drink lots of fluids and rest  Take levaquin as directed for sinus infection  mucinex is good for congestion if you need it Wear your sunscreen Update if not starting to improve in a week or if worsening

## 2012-12-18 NOTE — Assessment & Plan Note (Signed)
Will extend course of levaquin by 7 days  Disc symptomatic care - see instructions on AVS Update if not starting to improve in a week or if worsening

## 2013-01-15 ENCOUNTER — Telehealth: Payer: Self-pay

## 2013-01-15 NOTE — Telephone Encounter (Signed)
Pt left v/m pt will be coming to [facility] on 01/16/13 around lunchtime; pt is in [facility] and request samples of Amitiza 24 mcg and Nexium 40 mg.Please advise.

## 2013-01-15 NOTE — Telephone Encounter (Signed)
She is welcome to some if we have them

## 2013-01-16 NOTE — Telephone Encounter (Signed)
Left voicemail letting pt know we have samples of nexium and there is a bag of samples at the front desk for her

## 2013-01-19 ENCOUNTER — Other Ambulatory Visit: Payer: Self-pay | Admitting: *Deleted

## 2013-01-19 MED ORDER — LUBIPROSTONE 24 MCG PO CAPS: ORAL_CAPSULE | ORAL | Status: DC

## 2013-01-19 NOTE — Telephone Encounter (Signed)
done

## 2013-01-19 NOTE — Telephone Encounter (Signed)
Please refill for 6 mo 

## 2013-02-12 ENCOUNTER — Other Ambulatory Visit: Payer: Self-pay

## 2013-02-12 DIAGNOSIS — Z1231 Encounter for screening mammogram for malignant neoplasm of breast: Secondary | ICD-10-CM

## 2013-02-16 ENCOUNTER — Ambulatory Visit (INDEPENDENT_AMBULATORY_CARE_PROVIDER_SITE_OTHER): Payer: Medicare Other | Admitting: Family Medicine

## 2013-02-16 ENCOUNTER — Encounter: Payer: Self-pay | Admitting: Family Medicine

## 2013-02-16 VITALS — BP 110/70 | HR 76 | Temp 97.8°F | Ht 65.0 in | Wt 192.0 lb

## 2013-02-16 DIAGNOSIS — M549 Dorsalgia, unspecified: Secondary | ICD-10-CM

## 2013-02-16 LAB — POCT URINALYSIS DIPSTICK
Bilirubin, UA: NEGATIVE
Blood, UA: NEGATIVE
Glucose, UA: NEGATIVE
Nitrite, UA: NEGATIVE
Spec Grav, UA: 1.01

## 2013-02-16 MED ORDER — HYDROCODONE-ACETAMINOPHEN 5-325 MG PO TABS: 1.0000 | ORAL_TABLET | Freq: Four times a day (QID) | ORAL | Status: DC | PRN

## 2013-02-16 NOTE — Patient Instructions (Addendum)
Take the meloxicam with food and continue flexeril and ice for your back.  Take hydrocodone for pain and the call the chiropractor.  Take care.  Glad to see you.

## 2013-02-16 NOTE — Progress Notes (Signed)
End of July, she started having L lower back pain.  Pain with walking.  L of midline on the lower back.  She was out of town, used flexeril and [employer].  She got some better and continued that regimen.  She was overall some better.  She stopped taking flexeril in the meantime since she was okay on 02/08/13.  She did well last week until this past Friday.  Restarted flexeril this weekend.    Pain sitting, standing; less pain laying down.  Area of L lower back isn't very sore to touch.  No radiation down the leg.  Some pain radiating to the R lower back.  H/o polio, stronger on L leg at baseline.    Urine wnl on u/a today.  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  Goes to chiropractor monthly, "but I didn't think I could stand an adjustment."    Last flexeril and meloxicam yesterday.  Ice helps.    No FNAV, no blood in urine.    Meds, vitals, and allergies reviewed.   ROS: See HPI.  Otherwise, noncontributory.  nad ncat Mmm Rrr, faint SEM noted ctab abd soft Back w/o midline pain L lower back ttp but w/o rash No pain on forward flexion or facet loading Distally w/o focal weakness R leg length shortening noted

## 2013-02-16 NOTE — Assessment & Plan Note (Signed)
Likely benign MSK source for pain, would continue flexeril and nsaids with GI caution, add on vicodin with sedation caution.  She can f/u with chiropractor prn.  She agrees.  U/a unremarkable.

## 2013-03-25 ENCOUNTER — Other Ambulatory Visit: Payer: Self-pay | Admitting: Obstetrics and Gynecology

## 2013-03-25 ENCOUNTER — Other Ambulatory Visit (HOSPITAL_COMMUNITY)
Admission: RE | Admit: 2013-03-25 | Discharge: 2013-03-25 | Disposition: A | Discharge status: Home / Self Care | Payer: Medicare Other | Source: Ambulatory Visit | Attending: Obstetrics and Gynecology | Admitting: Obstetrics and Gynecology

## 2013-03-25 DIAGNOSIS — Z1151 Encounter for screening for human papillomavirus (HPV): Secondary | ICD-10-CM | POA: Insufficient documentation

## 2013-03-25 DIAGNOSIS — Z01419 Encounter for gynecological examination (general) (routine) without abnormal findings: Secondary | ICD-10-CM | POA: Insufficient documentation

## 2013-03-29 ENCOUNTER — Telehealth: Payer: Self-pay | Admitting: Family Medicine

## 2013-03-29 DIAGNOSIS — IMO0001 Reserved for inherently not codable concepts without codable children: Secondary | ICD-10-CM

## 2013-03-29 NOTE — Telephone Encounter (Signed)
Message copied by [name] on Sun Mar 29, 2013 12:05 PM ------      Message from: Buena Irish      Created: Fri Mar 27, 2013  7:53 AM      Regarding: Referral desired       Morning Dr. Milinda Antis,            Pt would like a referral to Dr. Corliss Skains, Rheumatologist  in [place].             Thank you, Bonita Quin T.    ------

## 2013-04-08 ENCOUNTER — Telehealth: Payer: Self-pay

## 2013-04-08 NOTE — Telephone Encounter (Signed)
She can have samples if we have them.

## 2013-04-08 NOTE — Telephone Encounter (Signed)
Pt left v/m ;pt is in [employer] [facility] and pt is requesting Nexium and Amitiza samples if available. Pt will be in our area 04/09/13. Pt request cb if can get samples.

## 2013-04-09 NOTE — Telephone Encounter (Signed)
Pt notified we have samples of Nexium but not Amatiza, samples at front desk for pt to pick up

## 2013-04-09 NOTE — Telephone Encounter (Signed)
Called pt's work (number listed in telephone note) but she had already left for the day.  No phone number listed for pt in chart.

## 2013-04-21 ENCOUNTER — Other Ambulatory Visit: Payer: Self-pay | Admitting: Family Medicine

## 2013-04-22 NOTE — Telephone Encounter (Signed)
Electronic refill request, please advise  

## 2013-04-22 NOTE — Telephone Encounter (Signed)
Please schedule PE march or later and refill until then  

## 2013-04-24 NOTE — Telephone Encounter (Signed)
Called work # (only # in chart) but office was closed

## 2013-04-27 NOTE — Telephone Encounter (Signed)
Pt has CPE already scheduled so med refill and pt notified

## 2013-04-27 NOTE — Telephone Encounter (Signed)
Pt request cb about refill; call [phone].

## 2013-04-28 ENCOUNTER — Ambulatory Visit
Admission: RE | Admit: 2013-04-28 | Discharge: 2013-04-28 | Disposition: A | Discharge status: Home / Self Care | Payer: Medicare Other | Source: Ambulatory Visit

## 2013-04-28 DIAGNOSIS — Z1231 Encounter for screening mammogram for malignant neoplasm of breast: Secondary | ICD-10-CM

## 2013-04-30 ENCOUNTER — Encounter: Payer: Self-pay | Admitting: *Deleted

## 2013-05-18 ENCOUNTER — Other Ambulatory Visit: Payer: Self-pay | Admitting: Family Medicine

## 2013-05-18 NOTE — Telephone Encounter (Signed)
Please refill for 6 mo 

## 2013-05-18 NOTE — Telephone Encounter (Signed)
Electronic refill request, please advise  

## 2013-05-19 NOTE — Telephone Encounter (Signed)
done

## 2013-06-01 ENCOUNTER — Encounter: Payer: Self-pay | Admitting: Family Medicine

## 2013-06-01 ENCOUNTER — Ambulatory Visit (INDEPENDENT_AMBULATORY_CARE_PROVIDER_SITE_OTHER): Payer: Medicare Other | Admitting: Family Medicine

## 2013-06-01 VITALS — BP 140/70 | HR 72 | Temp 97.9°F | Ht 65.0 in | Wt 193.5 lb

## 2013-06-01 DIAGNOSIS — M549 Dorsalgia, unspecified: Secondary | ICD-10-CM | POA: Insufficient documentation

## 2013-06-01 DIAGNOSIS — R109 Unspecified abdominal pain: Secondary | ICD-10-CM

## 2013-06-01 DIAGNOSIS — M546 Pain in thoracic spine: Secondary | ICD-10-CM

## 2013-06-01 LAB — POCT UA - MICROSCOPIC ONLY
Bacteria, U Microscopic: 0
RBC, urine, microscopic: 0
WBC, Ur, HPF, POC: 0

## 2013-06-01 LAB — POCT URINALYSIS DIPSTICK
Bilirubin, UA: NEGATIVE
Glucose, UA: NEGATIVE
Ketones, UA: NEGATIVE
Leukocytes, UA: NEGATIVE
Nitrite, UA: NEGATIVE
Urobilinogen, UA: 0.2

## 2013-06-01 MED ORDER — CYCLOBENZAPRINE HCL 10 MG PO TABS: 10.0000 mg | ORAL_TABLET | Freq: Three times a day (TID) | ORAL | Status: DC | PRN

## 2013-06-01 NOTE — Patient Instructions (Signed)
I think you have strained a muscle again  Use warm compresses on the area that hurts  Try flexeril (muscle relaxer)  Follow up with chiropractor Update if not starting to improve in a week or if worsening   Please send for last notes from ortho Dr Eulah Pont

## 2013-06-01 NOTE — Progress Notes (Signed)
Subjective:    Patient ID: Veronica Greene, female    DOB: 13-Nov-1945, 67 y.o.   MRN: [id number]  HPI Here with a terrible pain in L side (under bra and over waist)  Has had pain in the past but it was really different  In Aug - saw Dr Suzy Bouchard thought she had a pulled muscle  Went to chiropractor - and adj was helpful  Then saw ortho - who put her on prednisone - for deg disk dz   This feels different however  Started Thursday and got worse over the weekend  She did not go to her chiropractor this time  Her sister has kidney dz on dialysis  ? Cause  Mother had kidney failure also - had blood in urine (fever)  Pain is worse getting up and down out of chair and also getting out of her car (position)   Has been urinating more often No burning or blood in urine  Feels ok / no fever   More constipated - she took a hydrocodone last night   No new lifting or exercise program or new mattress   No rash She has had the shingle vaccine   Patient Active Problem List   Diagnosis Date Noted  . Mid back pain on left side 06/01/2013  . Back pain 02/16/2013  . Contact dermatitis 10/30/2012  . Hyperlipidemia, mild 09/02/2012  . Acute bacterial sinusitis 09/02/2012  . Other screening mammogram 12/12/2011  . Screening for lipoid disorders 08/31/2011  . Degenerative disk disease 08/31/2011  . Hypokalemia 01/02/2011  . Personal history of colonic polyps 11/03/2010  . Constipation, slow transit 11/03/2010  . Benign neoplasm of colon 11/03/2010  . Melanosis coli 11/03/2010  . GOUT, UNSPECIFIED 08/25/2010  . DERMATITIS, SEBORRHEIC 08/25/2010  . OSTEOPENIA 09/05/2007  . POLIOMYELITIS 08/26/2007  . OBESITY 08/26/2007  . ANXIETY 08/26/2007  . DEPRESSION 08/26/2007  . ALLERGIC RHINITIS 08/26/2007  . GERD 08/26/2007  . IBS 08/26/2007  . OVERACTIVE BLADDER 08/26/2007  . OSTEOARTHRITIS, HANDS, BILATERAL 08/26/2007  . FIBROMYALGIA 08/26/2007  . DIZZINESS OR VERTIGO 08/26/2007  . EDEMA,  CHRONIC 08/26/2007  . URINARY INCONTINENCE 08/26/2007  . HEMATURIA, HX OF 08/26/2007   Past Medical History  Diagnosis Date  . Allergy     allergic rhinitis  . GERD (gastroesophageal reflux disease)   . Anxiety   . Hypertension   . Depression   . Dizziness   . Urinary incontinence   . Osteopenia   . Cataracts, bilateral     early cataracts  . Gout   . Sinusitis, chronic   . IBS (irritable bowel syndrome)     with constipation  . Arthritis    Past Surgical History  Procedure Laterality Date  . Hand surgery      Bil  . Carpal tunnel release      Bil  . Thumb arthroscopy      Bil/  . Tonsillectomy      67 years old  . Colonoscopy    . Polio surg    . Ankle surgery      right  . Polypectomy    . Nasal sinus surgery  1976  . Knee surgery  1998    x2   History  Substance Use Topics  . Smoking status: Never Smoker   . Smokeless tobacco: Never Used  . Alcohol Use: No   Family History  Problem Relation Age of Onset  . Kidney disease Mother   . Stroke Father   .  Colon cancer Father   . Kidney disease Sister   . Cancer Paternal Aunt     COLON CA   Allergies  Allergen Reactions  . Clarithromycin     REACTION: throat swells  . Codeine     REACTION: nausea and vomiting  . Paroxetine     REACTION: doesn't work  . Penicillins     REACTION: whelps   Current Outpatient Prescriptions on File Prior to Visit  Medication Sig Dispense Refill  . Ascorbic Acid (VITAMIN C) 1000 MG tablet Take 1,000 mg by mouth daily.        Marland Kitchen aspirin 81 MG EC tablet Take 81 mg by mouth daily.        . Calcium Carbonate-Vitamin D (CALCIUM-VITAMIN D) 500-200 MG-UNIT per tablet Take 1 tablet by mouth daily. Take 400 units daily       . colchicine 0.6 MG tablet Take 0.6 mg by mouth as needed.      Marland Kitchen esomeprazole (NEXIUM) 40 MG capsule Take 1 capsule (40 mg total) by mouth daily before breakfast.  30 capsule  6  . guaiFENesin (MUCINEX) 600 MG 12 hr tablet Take 1,200 mg by mouth as needed.         . hydrochlorothiazide (HYDRODIURIL) 25 MG tablet TAKE 1 TABLET BY MOUTH ONCE DAILY.  90 tablet  1  . HYDROcodone-acetaminophen (NORCO/VICODIN) 5-325 MG per tablet Take 1 tablet by mouth every 6 (six) hours as needed for pain (sedation caution).  30 tablet  0  . lubiprostone (AMITIZA) 24 MCG capsule take 1 capsule by mouth twice a day  60 capsule  5  . meloxicam (MOBIC) 7.5 MG tablet TAKE 1 TABLET BY MOUTH DAILY WITH FOOD.  90 tablet  1  . Misc Natural Products (OSTEO BI-FLEX JOINT SHIELD PO) Take by mouth daily.        . mometasone (NASONEX) 50 MCG/ACT nasal spray Place 2 sprays into the nose daily.  51 g  3  . Multiple Vitamin (MULTIVITAMIN) tablet Take 1 tablet by mouth daily.        . potassium chloride (K-DUR) 10 MEQ tablet TAKE 2 TABLETS BY MOUTH TWICE A DAY.  360 tablet  3  . venlafaxine XR (EFFEXOR-XR) 75 MG 24 hr capsule TAKE 1 CAPSULE BY MOUTH ONCE DAILY.  90 capsule  1   No current facility-administered medications on file prior to visit.     Review of Systems    Review of Systems  Constitutional: Negative for fever, appetite change, fatigue and unexpected weight change.  Eyes: Negative for pain and visual disturbance.  Respiratory: Negative for cough and shortness of breath.   Cardiovascular: Negative for cp or palpitations    Gastrointestinal: Negative for nausea, diarrhea and constipation.  Genitourinary: Negative for urgency and pos for frequency /neg for hematuria  MSK pos for L side/back pain  Skin: Negative for pallor or rash   Neurological: Negative for weakness, light-headedness, numbness and headaches.  Hematological: Negative for adenopathy. Does not bruise/bleed easily.  Psychiatric/Behavioral: Negative for dysphoric mood. The patient is not nervous/anxious.      Objective:   Physical Exam  Constitutional: She appears well-developed and well-nourished. No distress.  obese and well appearing   HENT:  Head: Normocephalic.  Eyes: Conjunctivae and EOM are  normal. Pupils are equal, round, and reactive to light. No scleral icterus.  Neck: Normal range of motion. Neck supple.  Cardiovascular: Normal rate and regular rhythm.   Pulmonary/Chest: Effort normal and breath sounds normal. No  respiratory distress. She has no wheezes. She has no rales. She exhibits tenderness.  Tender over lower L lateral ribs No skin change or crepitus   Abdominal: Soft. Bowel sounds are normal. She exhibits no distension and no mass. There is no tenderness.  No suprapubic tenderness or fullness  No cva tenderness   Musculoskeletal: She exhibits tenderness. She exhibits no edema.  No spinal tenderness Tender in L thoracic musculature   Lymphadenopathy:    She has no cervical adenopathy.  Neurological: She is alert. She has normal strength and normal reflexes. No cranial nerve deficit or sensory deficit. Coordination normal.  Skin: Skin is warm and dry. No rash noted. No erythema. No pallor.  Psychiatric: She has a normal mood and affect.          Assessment & Plan:

## 2013-06-01 NOTE — Progress Notes (Signed)
Pre-visit discussion using our clinic review tool. No additional management support is needed unless otherwise documented below in the visit note.  

## 2013-06-01 NOTE — Assessment & Plan Note (Signed)
Suspect musculoskeletal given neg ua and worsened pain with movement Sent for records from ortho Heat Flexeril  Chiropractor Update if not starting to improve in a week or if worsening

## 2013-06-10 ENCOUNTER — Telehealth: Payer: Self-pay

## 2013-06-10 NOTE — Telephone Encounter (Signed)
I do not see it - went through media tab and notes

## 2013-06-10 NOTE — Telephone Encounter (Signed)
Pt left v/m; pt wants to know if Dr Milinda Antis has gotten xray results from Dr Eulah Pont yet. Pt is still having pain in left side but not as sore as last week. Pt request cb.

## 2013-06-12 ENCOUNTER — Telehealth: Payer: Self-pay | Admitting: Family Medicine

## 2013-06-12 NOTE — Telephone Encounter (Signed)
Please let pt know that I rev her ortho notes- it looks like they have imaged her lumbar spine and she does have deg disc disease there - I am curious to know if she has changes in her thoracic spine that would indicate so as well  Please schedule her to come in for TS xrays at her convenience-let me know when and I will order thanks

## 2013-06-15 NOTE — Telephone Encounter (Signed)
Pt advised and states an understanding 

## 2013-06-23 NOTE — Telephone Encounter (Signed)
Pt advised of Dr. Royden Purl comments. Pt didn't know right now what day would work for her to come in for xray so she will call us back to let us know the date

## 2013-06-25 ENCOUNTER — Encounter: Payer: Self-pay | Admitting: Family Medicine

## 2013-06-25 ENCOUNTER — Ambulatory Visit (INDEPENDENT_AMBULATORY_CARE_PROVIDER_SITE_OTHER): Payer: Medicare Other | Admitting: Family Medicine

## 2013-06-25 VITALS — BP 134/70 | HR 79 | Temp 97.9°F | Wt 191.5 lb

## 2013-06-25 DIAGNOSIS — B9689 Other specified bacterial agents as the cause of diseases classified elsewhere: Secondary | ICD-10-CM

## 2013-06-25 DIAGNOSIS — J019 Acute sinusitis, unspecified: Secondary | ICD-10-CM

## 2013-06-25 MED ORDER — CLINDAMYCIN HCL 300 MG PO CAPS: 300.0000 mg | ORAL_CAPSULE | Freq: Three times a day (TID) | ORAL | Status: DC

## 2013-06-25 NOTE — Patient Instructions (Signed)
Start the clindamycin and this should improve.  Take care.  Call back if needed.

## 2013-06-25 NOTE — Assessment & Plan Note (Signed)
Presumed, would prefer to reserve quinolone for refractory cases.  Discussed pros and cons.  Would use clinda for now.  She agrees. Supportive care o/w.  F/u prn. Nontoxic.

## 2013-06-25 NOTE — Progress Notes (Signed)
Pre-visit discussion using our clinic review tool. No additional management support is needed unless otherwise documented below in the visit note.  Sx started last week. Taking mucinex w/some temporary relief.  Started with HA, rhinorrhea, then post nasal gtt and cough more recently.  Using nasal saline with some help this AM.  No fevers known, felt like she was hot prev.  No sweats.  No vomiting, no diarrhea.    Meds, vitals, and allergies reviewed.   ROS: See HPI.  Otherwise, noncontributory.  GEN: nad, alert and oriented HEENT: mucous membranes moist, tm w/o erythema, nasal exam w/o erythema, clear discharge noted,  OP with cobblestoning, sinuses ttp B NECK: supple w/o LA CV: rrr.   PULM: ctab, no inc wob EXT: no edema SKIN: no acute rash

## 2013-07-13 ENCOUNTER — Encounter (HOSPITAL_BASED_OUTPATIENT_CLINIC_OR_DEPARTMENT_OTHER): Payer: Self-pay | Admitting: Emergency Medicine

## 2013-07-13 ENCOUNTER — Telehealth: Payer: Self-pay | Admitting: Family Medicine

## 2013-07-13 ENCOUNTER — Emergency Department (HOSPITAL_BASED_OUTPATIENT_CLINIC_OR_DEPARTMENT_OTHER)
Admission: EM | Admit: 2013-07-13 | Discharge: 2013-07-13 | Disposition: A | Discharge status: Home / Self Care | Payer: Medicare Other | Attending: Emergency Medicine | Admitting: Emergency Medicine

## 2013-07-13 DIAGNOSIS — I10 Essential (primary) hypertension: Secondary | ICD-10-CM | POA: Insufficient documentation

## 2013-07-13 DIAGNOSIS — Z880 Allergy status to penicillin: Secondary | ICD-10-CM | POA: Insufficient documentation

## 2013-07-13 DIAGNOSIS — K589 Irritable bowel syndrome without diarrhea: Secondary | ICD-10-CM | POA: Insufficient documentation

## 2013-07-13 DIAGNOSIS — Z792 Long term (current) use of antibiotics: Secondary | ICD-10-CM | POA: Insufficient documentation

## 2013-07-13 DIAGNOSIS — Z7982 Long term (current) use of aspirin: Secondary | ICD-10-CM | POA: Insufficient documentation

## 2013-07-13 DIAGNOSIS — J329 Chronic sinusitis, unspecified: Secondary | ICD-10-CM | POA: Insufficient documentation

## 2013-07-13 DIAGNOSIS — Z87448 Personal history of other diseases of urinary system: Secondary | ICD-10-CM | POA: Insufficient documentation

## 2013-07-13 DIAGNOSIS — F3289 Other specified depressive episodes: Secondary | ICD-10-CM | POA: Insufficient documentation

## 2013-07-13 DIAGNOSIS — F329 Major depressive disorder, single episode, unspecified: Secondary | ICD-10-CM | POA: Insufficient documentation

## 2013-07-13 DIAGNOSIS — Z9109 Other allergy status, other than to drugs and biological substances: Secondary | ICD-10-CM | POA: Insufficient documentation

## 2013-07-13 DIAGNOSIS — K219 Gastro-esophageal reflux disease without esophagitis: Secondary | ICD-10-CM | POA: Insufficient documentation

## 2013-07-13 DIAGNOSIS — K5900 Constipation, unspecified: Secondary | ICD-10-CM | POA: Insufficient documentation

## 2013-07-13 DIAGNOSIS — H269 Unspecified cataract: Secondary | ICD-10-CM | POA: Insufficient documentation

## 2013-07-13 DIAGNOSIS — Z79899 Other long term (current) drug therapy: Secondary | ICD-10-CM | POA: Insufficient documentation

## 2013-07-13 DIAGNOSIS — M899 Disorder of bone, unspecified: Secondary | ICD-10-CM | POA: Insufficient documentation

## 2013-07-13 DIAGNOSIS — F411 Generalized anxiety disorder: Secondary | ICD-10-CM | POA: Insufficient documentation

## 2013-07-13 DIAGNOSIS — IMO0002 Reserved for concepts with insufficient information to code with codable children: Secondary | ICD-10-CM | POA: Insufficient documentation

## 2013-07-13 DIAGNOSIS — M949 Disorder of cartilage, unspecified: Secondary | ICD-10-CM

## 2013-07-13 DIAGNOSIS — M109 Gout, unspecified: Secondary | ICD-10-CM | POA: Insufficient documentation

## 2013-07-13 DIAGNOSIS — J069 Acute upper respiratory infection, unspecified: Secondary | ICD-10-CM | POA: Insufficient documentation

## 2013-07-13 MED ORDER — LEVOFLOXACIN 500 MG PO TABS: 500.0000 mg | ORAL_TABLET | Freq: Every day | ORAL | Status: DC

## 2013-07-13 MED ORDER — ALBUTEROL SULFATE HFA 108 (90 BASE) MCG/ACT IN AERS
2.0000 | INHALATION_SPRAY | Freq: Once | RESPIRATORY_TRACT | Status: AC
Start: 2013-07-13 — End: 2013-07-13
  Administered 2013-07-13: 2 via RESPIRATORY_TRACT
  Filled 2013-07-13: qty 6.7

## 2013-07-13 NOTE — ED Notes (Signed)
Productive cough with brownish secretions.

## 2013-07-13 NOTE — Telephone Encounter (Signed)
[facility] went to [facility], I left voicemail letting pt know that's what Dr. Glori Bickers wanted her to do is go to [facility]

## 2013-07-13 NOTE — Discharge Instructions (Signed)

## 2013-07-13 NOTE — ED Provider Notes (Signed)
CSN: [id number]     Arrival date & time 07/13/13  1325 History   First MD Initiated Contact with Patient 07/13/13 1343     Chief Complaint  Patient presents with  . URI   Patient is a 68 y.o. female presenting with URI. The history is provided by the patient.  URI Presenting symptoms: congestion, cough and rhinorrhea   Severity:  Moderate Onset quality:  Gradual Duration:  3 days Timing:  Intermittent Progression:  Worsening Chronicity:  Recurrent Relieved by:  Nothing Associated symptoms: sinus pain   pt reports she was treated for sinus infection last month and given clindamycin but reports her symptoms never really resolved.  She reports 3 days ago her cough worsened with continued sinus pain and congestion.  She reports mild HA.  No SOB.  No CP.  No fever.  No significant myalgias  Past Medical History  Diagnosis Date  . Allergy     allergic rhinitis  . GERD (gastroesophageal reflux disease)   . Anxiety   . Hypertension   . Depression   . Dizziness   . Urinary incontinence   . Osteopenia   . Cataracts, bilateral     early cataracts  . Gout   . Sinusitis, chronic   . IBS (irritable bowel syndrome)     with constipation  . Arthritis    Past Surgical History  Procedure Laterality Date  . Hand surgery      Bil  . Carpal tunnel release      Bil  . Thumb arthroscopy      Bil/  . Tonsillectomy      68 years old  . Colonoscopy    . Polio surg    . Ankle surgery      right  . Polypectomy    . Nasal sinus surgery  1976  . Knee surgery  1998    x2   Family History  Problem Relation Age of Onset  . Kidney disease Mother   . Stroke Father   . Colon cancer Father   . Kidney disease Sister   . Cancer Paternal Aunt     COLON CA   History  Substance Use Topics  . Smoking status: Never Smoker   . Smokeless tobacco: Never Used  . Alcohol Use: No   OB History   Grav Para Term Preterm Abortions TAB SAB Ect Mult Living                 Review of Systems  HENT:  Positive for congestion and rhinorrhea.   Respiratory: Positive for cough.     Allergies  Clarithromycin; Codeine; Paroxetine; and Penicillins  Home Medications   Current Outpatient Rx  Name  Route  Sig  Dispense  Refill  . Ascorbic Acid (VITAMIN C) 1000 MG tablet   Oral   Take 1,000 mg by mouth daily.           Marland Kitchen aspirin 81 MG EC tablet   Oral   Take 81 mg by mouth daily.           . Calcium Carbonate-Vitamin D (CALCIUM-VITAMIN D) 500-200 MG-UNIT per tablet   Oral   Take 1 tablet by mouth daily. Take 400 units daily          . clindamycin (CLEOCIN) 300 MG capsule   Oral   Take 1 capsule (300 mg total) by mouth 3 (three) times daily.   30 capsule   0   . colchicine 0.6 MG tablet  Oral   Take 0.6 mg by mouth as needed.         . cyclobenzaprine (FLEXERIL) 10 MG tablet   Oral   Take 1 tablet (10 mg total) by mouth 3 (three) times daily as needed for muscle spasms.   30 tablet   1   . esomeprazole (NEXIUM) 40 MG capsule   Oral   Take 1 capsule (40 mg total) by mouth daily before breakfast.   30 capsule   6   . guaiFENesin (MUCINEX) 600 MG 12 hr tablet   Oral   Take 1,200 mg by mouth as needed.           . hydrochlorothiazide (HYDRODIURIL) 25 MG tablet      TAKE 1 TABLET BY MOUTH ONCE DAILY.   90 tablet   1   . HYDROcodone-acetaminophen (NORCO/VICODIN) 5-325 MG per tablet   Oral   Take 1 tablet by mouth every 6 (six) hours as needed for pain (sedation caution).   30 tablet   0   . levofloxacin (LEVAQUIN) 500 MG tablet   Oral   Take 1 tablet (500 mg total) by mouth daily.   7 tablet   0   . lubiprostone (AMITIZA) 24 MCG capsule      take 1 capsule by mouth twice a day   60 capsule   5   . meloxicam (MOBIC) 7.5 MG tablet      TAKE 1 TABLET BY MOUTH DAILY WITH FOOD.   90 tablet   1   . Misc Natural Products (OSTEO BI-FLEX JOINT SHIELD PO)   Oral   Take by mouth daily.           . mometasone (NASONEX) 50 MCG/ACT nasal spray    Nasal   Place 2 sprays into the nose daily.   51 g   3   . Multiple Vitamin (MULTIVITAMIN) tablet   Oral   Take 1 tablet by mouth daily.           . potassium chloride (K-DUR) 10 MEQ tablet      TAKE 2 TABLETS BY MOUTH TWICE A DAY.   360 tablet   3   . venlafaxine XR (EFFEXOR-XR) 75 MG 24 hr capsule      TAKE 1 CAPSULE BY MOUTH ONCE DAILY.   90 capsule   1    BP 160/67  Pulse 74  Temp(Src) 98.2 F (36.8 C) (Oral)  Resp 20  Ht 5\' 5"  (1.651 m)  Wt 191 lb (86.637 kg)  BMI 31.78 kg/m2  SpO2 100% Physical Exam CONSTITUTIONAL: Well developed/well nourished HEAD: Normocephalic/atraumatic EYES: EOMI/PERRL ENMT: Mucous membranes moist, nasal congestion noted, facial tenderness without edema/erythema noted, uvula midline  NECK: supple no meningeal signs CV: S1/S2 noted, no murmurs/rubs/gallops noted LUNGS: Lungs are clear to auscultation bilaterally, no apparent distress ABDOMEN: soft, nontender, no rebound or guarding NEURO: Pt is awake/alert, moves all extremitiesx4 EXTREMITIES: pulses normal, full ROM SKIN: warm, color normal PSYCH: no abnormalities of mood noted  ED Course  Procedures (including critical care time) Labs Review Labs Reviewed - No data to display Imaging Review No results found.  EKG Interpretation   None       MDM   1. Sinusitis    Nursing notes including past medical history and social history reviewed and considered in documentation  Pt requesting levaquin for her symptoms She has multiple antibiotic allergies I advised using conservative measures first and if no improvement, Rx for levaquin given but  not for fill after 1/8.  Also, albuterol mdi given for her cough     Sharyon Cable, MD 07/13/13 1418

## 2013-07-13 NOTE — Telephone Encounter (Signed)
Patient Information:  Caller Name: [place]  Phone: [phone]  Patient: Veronica Greene, Veronica Greene  Gender: Female  DOB: 10-Jan-1946  Age: 68 Years  PCP: Loura Pardon [facility])  Office Follow Up:  Does the office need to follow up with this patient?: No  Instructions For The Office: N/A   Symptoms  Reason For Call & Symptoms: Pt has a cough -onset 07/10/13. Pt has a deep productive cough and is requesting an appt. She is coughing up brown sputum.  Pt states she still has painful /full sinuses. She was treated for this with 10 days of Clindamycin that she finished on 06/22/13. Pt afeb. RN triaged and offered only available appt left today is at [facility] location. Pt refused. She has an appt scheduled for tomorrow but she wants to be seen today. She will go to [facility] if able.   Reviewed Health History In EMR: Yes  Reviewed Medications In EMR: Yes  Reviewed Allergies In EMR: Yes  Reviewed Surgeries / Procedures: Yes  Date of Onset of Symptoms: 07/10/2013  Guideline(s) Used:  Cough  Disposition Per Guideline:   See Today in Office  Reason For Disposition Reached:   Coughing up rusty-colored (reddish-brown) or blood-tinged sputum  Advice Given:  N/A  Patient Will Follow Care Advice:  YES

## 2013-07-13 NOTE — Telephone Encounter (Signed)
No appts avail- enc her to go to UC please

## 2013-07-13 NOTE — ED Notes (Signed)
Patient states she was treated for a sinus infection on 06/25/13 with clindamycin for ten days.  Has completed treatment.  Continues to have cough and feels bad.

## 2013-07-14 ENCOUNTER — Ambulatory Visit: Payer: Medicare Other | Admitting: Internal Medicine

## 2013-07-14 ENCOUNTER — Other Ambulatory Visit: Payer: Self-pay | Admitting: Family Medicine

## 2013-07-20 ENCOUNTER — Telehealth: Payer: Self-pay

## 2013-07-20 MED ORDER — SCOPOLAMINE 1 MG/3DAYS TD PT72: 1.0000 | MEDICATED_PATCH | TRANSDERMAL | Status: DC

## 2013-07-20 NOTE — Telephone Encounter (Signed)
Pt left v/m; pt flying on 07/24/13 and returning on 07/26/13 and pt gets woozy flying and may be on boat also. Pt wants motion sickness patches for the weekend to [facility].

## 2013-07-20 NOTE — Telephone Encounter (Signed)
I will send them  Let her know they can cause some sedation -use caution

## 2013-07-20 NOTE — Telephone Encounter (Signed)
Left message with spouse letting him know Rx sent

## 2013-07-29 ENCOUNTER — Encounter: Payer: Self-pay | Admitting: Gastroenterology

## 2013-07-29 ENCOUNTER — Encounter: Payer: Self-pay | Admitting: *Deleted

## 2013-08-04 ENCOUNTER — Encounter: Payer: Self-pay | Admitting: Gastroenterology

## 2013-08-04 ENCOUNTER — Ambulatory Visit (INDEPENDENT_AMBULATORY_CARE_PROVIDER_SITE_OTHER): Payer: Medicare Other | Admitting: Gastroenterology

## 2013-08-04 VITALS — BP 130/64 | HR 84 | Ht 64.5 in | Wt 196.0 lb

## 2013-08-04 DIAGNOSIS — Z86010 Personal history of colonic polyps: Secondary | ICD-10-CM

## 2013-08-04 DIAGNOSIS — K5900 Constipation, unspecified: Secondary | ICD-10-CM

## 2013-08-04 DIAGNOSIS — K219 Gastro-esophageal reflux disease without esophagitis: Secondary | ICD-10-CM

## 2013-08-04 DIAGNOSIS — R079 Chest pain, unspecified: Secondary | ICD-10-CM

## 2013-08-04 NOTE — Patient Instructions (Signed)

## 2013-08-04 NOTE — Progress Notes (Signed)
This is a 68 year old Caucasian female that we have followed for many years because of chronic constipation and acid reflux.  She is on currently Amitiza 24 mcg twice a day.  She has a history of recurrent colon polyps, but most recent colonoscopy was unremarkable.  She been on Nexium 40 mg a day for several years.  Since Thanksgiving she's had a burning pain in the right mid substernal area without other typical reflux symptoms or dysphagia.  She has been on clindamycin in December in January because recurrent sinusitis.  She denies painful swallowing, hepatobiliary or other lower GI complaints.  There is been no melena, hematochezia, fever, chills, current sinusitis or ENT or pulmonary problems.  Her appetite is good her weight is stable.  She has been on NSAIDs because of left hip pain related to her post polio syndrome, but has been off of all NSAIDs for 2 weeks.  Current Medications, Allergies, Past Medical History, Past Surgical History, Family History and Social History were reviewed in [employer] record.  ROS: All systems were reviewed and are negative unless otherwise stated in the HPI.          Physical Exam: Blood pressure 130/64, pulse 84 and regular and weight 196 the BMI of 33.14.  Cannot appreciate stigmata of chronic liver disease.  There is no thyromegaly or lymphadenopathy or other neck abnormalities.  Her chest is entirely clear and there no murmurs gallops or rubs.  She appears to be in a regular rhythm.  Her abdomen shows no organomegaly, masses or tenderness.  Bowel sounds are normal.  Mental status is normal tenderness oropharynx showed no mucosal lesions or evidence of Candida.    Assessment and Plan: I'm concerned this patient may have Candida infection her esophagus although I cannot see any oral mucosal lesions she certainly is been on proton pump inhibitor for a long period of time, and is use recent twice a day Nexium with and acid without  improvement.  Other consideration be NSAID induced esophagitis or gastritis.  I've schedule her for endoscopy, and this is unremarkable consider esophageal manometry.  Reviewed her chart shows problems: Back at least 10 years per recurrent esophageal problems.  She currently has no hepatobiliary symptoms to suggest cholelithiasis, but probably also will need gallbladder ultrasound complete her workup with endoscopy is unremarkable.  For now she is to continue medications as listed and reviewed.  History of recurrent colon polyps she'll need followup colonoscopy 3 years from her last colonoscopy which will be made in 2016.  I will put her in her colonoscopy recall system for that date.

## 2013-08-05 ENCOUNTER — Encounter: Payer: Self-pay | Admitting: Gastroenterology

## 2013-08-05 ENCOUNTER — Other Ambulatory Visit: Payer: Self-pay | Admitting: *Deleted

## 2013-08-05 ENCOUNTER — Ambulatory Visit (AMBULATORY_SURGERY_CENTER): Payer: Medicare Other | Admitting: Gastroenterology

## 2013-08-05 VITALS — BP 148/65 | HR 59 | Temp 98.3°F | Resp 17 | Ht 64.0 in | Wt 196.0 lb

## 2013-08-05 DIAGNOSIS — R079 Chest pain, unspecified: Secondary | ICD-10-CM

## 2013-08-05 DIAGNOSIS — D130 Benign neoplasm of esophagus: Secondary | ICD-10-CM

## 2013-08-05 MED ORDER — SUCRALFATE 1 GM/10ML PO SUSP: 1.0000 g | Freq: Four times a day (QID) | ORAL | Status: DC

## 2013-08-05 MED ORDER — SODIUM CHLORIDE 0.9 % IV SOLN: 500.0000 mL | INTRAVENOUS | Status: DC

## 2013-08-05 NOTE — Op Note (Signed)
[place]  [address] [place], [address]   ENDOSCOPY PROCEDURE REPORT  PATIENT: Veronica, Greene  MR#: [id number] BIRTHDATE: Apr 06, 1946 , 67  yrs. old GENDER: Female ENDOSCOPIST:Dandrea Widdowson Consuello Masse, MD, [facility] REFERRED BY: PROCEDURE DATE:  08/05/2013 PROCEDURE:   EGD w/ biopsy ASA CLASS:    Class II INDICATIONS: Chest pain. MEDICATION: propofol (Diprivan) 100mg  IV TOPICAL ANESTHETIC:  DESCRIPTION OF PROCEDURE:   After the risks and benefits of the procedure were explained, informed consent was obtained.  The LB [id number]  endoscope was introduced through the mouth  and advanced to the second portion of the duodenum .  The instrument was slowly withdrawn as the mucosa was fully examined.      DUODENUM: The duodenal mucosa showed no abnormalities in the bulb and second portion of the duodenum.  STOMACH: The mucosa of the stomach appeared normal.  ESOPHAGUS: The mucosa of the esophagus appeared normal.  Multiple random biopsies were performed.    Retroflexed views revealed no abnormalities.    The scope was then withdrawn from the patient and the procedure completed.  COMPLICATIONS: There were no complications.   ENDOSCOPIC IMPRESSION: 1.   The duodenal mucosa showed no abnormalities in the bulb and second portion of the duodenum 2.   The mucosa of the stomach appeared normal 3.   The mucosa of the esophagus appeared normal; multiple random biopsies...r/o eosinophilic esophagitis vs refractort GERD  RECOMMENDATIONS: 1.  Await biopsy results 2.  Continue PPI 3.   prn carafate suspension pc and qhs    _______________________________ eSigned:  Sable Feil, MD, [facility] 08/05/2013 2:03 PM   standard discharge   PATIENT NAME:  Veronica, Greene MR#: [id number]

## 2013-08-05 NOTE — Progress Notes (Signed)
Called to room to assist after endoscopic procedure.  Patient ID and intended procedure confirmed with present staff. Received instructions for my participation in the procedure from the performing endo tech, [employer].

## 2013-08-05 NOTE — Progress Notes (Signed)
Patient's pharmacy called, spoke with Margy Clarks. Per pharmacy, after pharmacist spoke with the patient and patient agreeing, they want to give patient Carafate pills instead of liquid to save the patient $90 per RX.  Patient aware.

## 2013-08-05 NOTE — Patient Instructions (Signed)
YOU HAD AN ENDOSCOPIC PROCEDURE TODAY AT THE [facility]: Refer to the procedure report that was given to you for any specific questions about what was found during the examination.  If the procedure report does not answer your questions, please call your gastroenterologist to clarify.  If you requested that your care partner not be given the details of your procedure findings, then the procedure report has been included in a sealed envelope for you to review at your convenience later.  YOU SHOULD EXPECT: Some feelings of bloating in the abdomen. Passage of more gas than usual.  Walking can help get rid of the air that was put into your GI tract during the procedure and reduce the bloating. If you had a lower endoscopy (such as a colonoscopy or flexible sigmoidoscopy) you may notice spotting of blood in your stool or on the toilet paper. If you underwent a bowel prep for your procedure, then you may not have a normal bowel movement for a few days.  DIET: Your first meal following the procedure should be a light meal and then it is ok to progress to your normal diet.  A half-sandwich or bowl of soup is an example of a good first meal.  Heavy or fried foods are harder to digest and may make you feel nauseous or bloated.  Likewise meals heavy in dairy and vegetables can cause extra gas to form and this can also increase the bloating.  Drink plenty of fluids but you should avoid alcoholic beverages for 24 hours.  ACTIVITY: Your care partner should take you home directly after the procedure.  You should plan to take it easy, moving slowly for the rest of the day.  You can resume normal activity the day after the procedure however you should NOT DRIVE or use heavy machinery for 24 hours (because of the sedation medicines used during the test).    SYMPTOMS TO REPORT IMMEDIATELY: A gastroenterologist can be reached at any hour.  During normal business hours, 8:30 AM to 5:00 PM Monday through Friday,  call [phone].  After hours and on weekends, please call the GI answering service at [phone] who will take a message and have the physician on call contact you.    Following upper endoscopy (EGD)  Vomiting of blood or coffee ground material  New chest pain or pain under the shoulder blades  Painful or persistently difficult swallowing  New shortness of breath  Fever of 100F or higher  Black, tarry-looking stools  FOLLOW UP: If any biopsies were taken you will be contacted by phone or by letter within the next 1-3 weeks.  Call your gastroenterologist if you have not heard about the biopsies in 3 weeks.  Our staff will call the home number listed on your records the next business day following your procedure to check on you and address any questions or concerns that you may have at that time regarding the information given to you following your procedure. This is a courtesy call and so if there is no answer at the home number and we have not heard from you through the emergency physician on call, we will assume that you have returned to your regular daily activities without incident.  SIGNATURES/CONFIDENTIALITY: You and/or your care partner have signed paperwork which will be entered into your electronic medical record.  These signatures attest to the fact that that the information above on your After Visit Summary has been reviewed and is understood.  Full   responsibility of the confidentiality of this discharge information lies with you and/or your care-partner.   Resume medications. Information given on [place] with discharge instructions. 

## 2013-08-05 NOTE — Progress Notes (Signed)
A/ox3 pleased with MAC, report to Sheila RN 

## 2013-08-06 ENCOUNTER — Telehealth: Payer: Self-pay | Admitting: *Deleted

## 2013-08-06 NOTE — Telephone Encounter (Signed)
  Follow up Call-  Call back number 08/05/2013 11/09/2011  Post procedure Call Back phone  # [phone]  Permission to leave phone message Yes Yes     Patient questions:  Do you have a fever, pain , or abdominal swelling? no Pain Score  0 *  Have you tolerated food without any problems? yes  Have you been able to return to your normal activities? yes  Do you have any questions about your discharge instructions: Diet   no Medications  no Follow up visit  no  Do you have questions or concerns about your Care? no  Actions: * If pain score is 4 or above: No action needed, pain <4.

## 2013-08-11 ENCOUNTER — Encounter: Payer: Self-pay | Admitting: Gastroenterology

## 2013-08-24 ENCOUNTER — Ambulatory Visit (INDEPENDENT_AMBULATORY_CARE_PROVIDER_SITE_OTHER)
Admission: RE | Admit: 2013-08-24 | Discharge: 2013-08-24 | Disposition: A | Discharge status: Home / Self Care | Payer: Medicare Other | Source: Ambulatory Visit | Attending: Family Medicine | Admitting: Family Medicine

## 2013-08-24 ENCOUNTER — Encounter: Payer: Self-pay | Admitting: Family Medicine

## 2013-08-24 ENCOUNTER — Ambulatory Visit (INDEPENDENT_AMBULATORY_CARE_PROVIDER_SITE_OTHER): Payer: Medicare Other | Admitting: Family Medicine

## 2013-08-24 ENCOUNTER — Ambulatory Visit: Payer: Medicare Other | Admitting: Family Medicine

## 2013-08-24 VITALS — BP 134/68 | HR 68 | Temp 97.8°F | Ht 65.0 in | Wt 195.2 lb

## 2013-08-24 DIAGNOSIS — R05 Cough: Secondary | ICD-10-CM

## 2013-08-24 DIAGNOSIS — R059 Cough, unspecified: Secondary | ICD-10-CM

## 2013-08-24 MED ORDER — BENZONATATE 200 MG PO CAPS: 200.0000 mg | ORAL_CAPSULE | Freq: Three times a day (TID) | ORAL | Status: DC | PRN

## 2013-08-24 NOTE — Progress Notes (Signed)
Pre-visit discussion using our clinic review tool. No additional management support is needed unless otherwise documented below in the visit note.  

## 2013-08-24 NOTE — Patient Instructions (Signed)
Chest xray today Give the nexium a little longer to work  Avoid mints /peppermint -this can aggravate cough  Here is a px for tessalon for cough  If cough continues for more than 2 more weeks -let me know - we may want to get you to a pulmonary doctor

## 2013-08-24 NOTE — Progress Notes (Signed)
Subjective:    Patient ID: Veronica Greene, female    DOB: Jun 09, 1946, 68 y.o.   MRN: [id number]  HPI Here with uri symptoms   Had a sinus infection in December - tx with doxy - improved but did not go away "the only thing that works is levaquin"  Then went to [facility] in [place] -- given levaquin and took it for 7 days  She did not improve much -called her ENT and he suggeted 7 more days   2/6 saw her ENT for a visit - told to take mucinex and nasal spray  Did improve-only cough remains  Dr Wilburn Cornelia   2/4 had EGD with Dr Sharlett Iles - was ok - then told to take nexium bid  For gerd  Is doing better with that   Chest discomfort is resolved  Cough is not resolved  (just a little imp)  - ENT does not know what is causing it   Cough is productive and dry intermittently  Mucous is not yellow  Nose is dry - she uses saline and nasonex  No sinus pain   (occ ha and eyes water and itch a bit)   Patient Active Problem List   Diagnosis Date Noted  . Mid back pain on left side 06/01/2013  . Back pain 02/16/2013  . Contact dermatitis 10/30/2012  . Hyperlipidemia, mild 09/02/2012  . Acute bacterial sinusitis 09/02/2012  . Other screening mammogram 12/12/2011  . Screening for lipoid disorders 08/31/2011  . Degenerative disk disease 08/31/2011  . Hypokalemia 01/02/2011  . Personal history of colonic polyps 11/03/2010  . Constipation, slow transit 11/03/2010  . Benign neoplasm of colon 11/03/2010  . Melanosis coli 11/03/2010  . GOUT, UNSPECIFIED 08/25/2010  . DERMATITIS, SEBORRHEIC 08/25/2010  . OSTEOPENIA 09/05/2007  . POLIOMYELITIS 08/26/2007  . OBESITY 08/26/2007  . ANXIETY 08/26/2007  . DEPRESSION 08/26/2007  . ALLERGIC RHINITIS 08/26/2007  . GERD 08/26/2007  . IBS 08/26/2007  . OVERACTIVE BLADDER 08/26/2007  . OSTEOARTHRITIS, HANDS, BILATERAL 08/26/2007  . FIBROMYALGIA 08/26/2007  . DIZZINESS OR VERTIGO 08/26/2007  . EDEMA, CHRONIC 08/26/2007  . URINARY INCONTINENCE 08/26/2007    . HEMATURIA, HX OF 08/26/2007   Past Medical History  Diagnosis Date  . Allergy     allergic rhinitis  . GERD (gastroesophageal reflux disease)   . Anxiety   . Hypertension   . Depression   . Dizziness   . Urinary incontinence   . Osteopenia   . Cataracts, bilateral     early cataracts  . Gout   . Sinusitis, chronic   . IBS (irritable bowel syndrome)     with constipation  . Arthritis    Past Surgical History  Procedure Laterality Date  . Hand surgery      Bil  . Carpal tunnel release      Bil  . Thumb arthroscopy      Bil/  . Tonsillectomy      68 years old  . Colonoscopy  11/09/2011  . Polio surg    . Ankle surgery      right  . Colonoscopy w/ polypectomy  10/21/2009  . Nasal sinus surgery  1976  . Knee surgery  1998    x2   History  Substance Use Topics  . Smoking status: Never Smoker   . Smokeless tobacco: Never Used  . Alcohol Use: No   Family History  Problem Relation Age of Onset  . Kidney disease Mother   . Stroke Father   .  Colon cancer Father   . Kidney disease Sister   . Cancer Paternal Aunt     COLON CA   Allergies  Allergen Reactions  . Clarithromycin     REACTION: throat swells  . Codeine     REACTION: nausea and vomiting  . Paroxetine     REACTION: doesn't work  . Penicillins     REACTION: whelps   Current Outpatient Prescriptions on File Prior to Visit  Medication Sig Dispense Refill  . AMITIZA 24 MCG capsule TAKE 1 CAPSULE BY MOUTH TWICE A DAY.  180 capsule  0  . Ascorbic Acid (VITAMIN C) 1000 MG tablet Take 1,000 mg by mouth daily.        Marland Kitchen aspirin 81 MG EC tablet Take 81 mg by mouth daily.        . Calcium Carbonate-Vitamin D (CALCIUM-VITAMIN D) 500-200 MG-UNIT per tablet Take 1 tablet by mouth daily. Take 400 units daily       . colchicine 0.6 MG tablet Take 0.6 mg by mouth as needed.      Marland Kitchen guaiFENesin (MUCINEX) 600 MG 12 hr tablet Take 1,200 mg by mouth as needed.        . hydrochlorothiazide (HYDRODIURIL) 25 MG tablet  TAKE 1 TABLET BY MOUTH ONCE DAILY.  90 tablet  1  . meloxicam (MOBIC) 7.5 MG tablet TAKE 1 TABLET BY MOUTH DAILY WITH FOOD.  90 tablet  1  . mometasone (NASONEX) 50 MCG/ACT nasal spray Place 2 sprays into the nose daily.  51 g  3  . Multiple Vitamin (MULTIVITAMIN) tablet Take 1 tablet by mouth daily.        . potassium chloride (K-DUR) 10 MEQ tablet TAKE 2 TABLETS BY MOUTH TWICE A DAY.  360 tablet  3  . venlafaxine XR (EFFEXOR-XR) 75 MG 24 hr capsule TAKE 1 CAPSULE BY MOUTH ONCE DAILY.  90 capsule  1  . cyclobenzaprine (FLEXERIL) 10 MG tablet Take 1 tablet (10 mg total) by mouth 3 (three) times daily as needed for muscle spasms.  30 tablet  1  . scopolamine (TRANSDERM-SCOP) 1.5 MG Place 1 patch (1.5 mg total) onto the skin every 3 (three) days.  10 patch  0   No current facility-administered medications on file prior to visit.     Review of Systems Review of Systems  Constitutional: Negative for fever, appetite change,  and unexpected weight change.  ENT pos for occ congestion/neg for sinus pain  Eyes: Negative for pain and visual disturbance.  Respiratory: Negative for wheeze and shortness of breath.   Cardiovascular: Negative for cp or palpitations    Gastrointestinal: Negative for nausea, diarrhea and constipation.  Genitourinary: Negative for urgency and frequency.  Skin: Negative for pallor or rash   Neurological: Negative for weakness, light-headedness, numbness and headaches.  Hematological: Negative for adenopathy. Does not bruise/bleed easily.  Psychiatric/Behavioral: Negative for dysphoric mood. The patient is not nervous/anxious.         Objective:   Physical Exam  Constitutional: She appears well-developed and well-nourished. No distress.  obese and well appearing   HENT:  Head: Normocephalic and atraumatic.  Right Ear: External ear normal.  Left Ear: External ear normal.  Mouth/Throat: Oropharynx is clear and moist. No oropharyngeal exudate.  Boggy nares No sinus  tenderness  Eyes: Conjunctivae and EOM are normal. Pupils are equal, round, and reactive to light. Right eye exhibits no discharge. Left eye exhibits no discharge. No scleral icterus.  Neck: Normal range of motion. Neck  supple. No thyromegaly present.  Cardiovascular: Normal rate, regular rhythm, normal heart sounds and intact distal pulses.  Exam reveals no gallop.   Pulmonary/Chest: Effort normal and breath sounds normal. No respiratory distress. She has no wheezes. She has no rales. She exhibits no tenderness.  Musculoskeletal: She exhibits no edema.  Lymphadenopathy:    She has no cervical adenopathy.  Neurological: She is alert. She has normal reflexes. No cranial nerve deficit. She exhibits normal muscle tone. Coordination normal.  Skin: Skin is warm and dry. No rash noted. No erythema. No pallor.  Psychiatric: She has a normal mood and affect.          Assessment & Plan:

## 2013-08-25 NOTE — Assessment & Plan Note (Signed)
Acute on chronic and likely multifactorial  Some imp with nexium bid - that has only been a week  Her sinus symptoms are imp  cxr today  ? Consider pulm consult if no imp  Disc cyclic cough and lifestyle change Tessalon prn

## 2013-09-04 ENCOUNTER — Encounter: Payer: Medicare Other | Admitting: Family Medicine

## 2013-09-25 ENCOUNTER — Other Ambulatory Visit (INDEPENDENT_AMBULATORY_CARE_PROVIDER_SITE_OTHER): Payer: Medicare Other

## 2013-09-25 DIAGNOSIS — Z0000 Encounter for general adult medical examination without abnormal findings: Secondary | ICD-10-CM

## 2013-09-25 DIAGNOSIS — E785 Hyperlipidemia, unspecified: Secondary | ICD-10-CM

## 2013-09-25 DIAGNOSIS — E876 Hypokalemia: Secondary | ICD-10-CM

## 2013-09-25 DIAGNOSIS — M949 Disorder of cartilage, unspecified: Secondary | ICD-10-CM

## 2013-09-25 DIAGNOSIS — M899 Disorder of bone, unspecified: Secondary | ICD-10-CM

## 2013-09-25 LAB — COMPREHENSIVE METABOLIC PANEL
ALT: 20 U/L (ref 0–35)
AST: 19 U/L (ref 0–37)
Albumin: 4.4 g/dL (ref 3.5–5.2)
Alkaline Phosphatase: 58 U/L (ref 39–117)
BUN: 11 mg/dL (ref 6–23)
CO2: 32 mEq/L (ref 19–32)
Calcium: 9.3 mg/dL (ref 8.4–10.5)
Chloride: 104 mEq/L (ref 96–112)
Creatinine, Ser: 0.7 mg/dL (ref 0.4–1.2)
GFR: 84.12 mL/min (ref >60.00)
GLUCOSE: 96 mg/dL (ref 70–99)
Potassium: 4.2 mEq/L (ref 3.5–5.1)
Sodium: 139 mEq/L (ref 135–145)
TOTAL PROTEIN: 6.7 g/dL (ref 6.0–8.3)
Total Bilirubin: 0.8 mg/dL (ref 0.3–1.2)

## 2013-09-25 LAB — TSH: TSH: 2.12 u[IU]/mL (ref 0.35–5.50)

## 2013-09-25 LAB — CBC WITH DIFFERENTIAL/PLATELET
BASOS PCT: 0.4 % (ref 0.0–3.0)
Basophils Absolute: 0 10*3/uL (ref 0.0–0.1)
EOS ABS: 0.1 10*3/uL (ref 0.0–0.7)
Eosinophils Relative: 2.9 % (ref 0.0–5.0)
HEMATOCRIT: 39.5 % (ref 36.0–46.0)
Hemoglobin: 13.6 g/dL (ref 12.0–15.0)
Lymphocytes Relative: 30.6 % (ref 12.0–46.0)
Lymphs Abs: 1.4 10*3/uL (ref 0.7–4.0)
MCHC: 34.4 g/dL (ref 30.0–36.0)
MCV: 88.7 fl (ref 78.0–100.0)
Monocytes Absolute: 0.2 10*3/uL (ref 0.1–1.0)
Monocytes Relative: 3.9 % (ref 3.0–12.0)
NEUTROS PCT: 62.2 % (ref 43.0–77.0)
Neutro Abs: 2.8 10*3/uL (ref 1.4–7.7)
PLATELETS: 198 10*3/uL (ref 150.0–400.0)
RBC: 4.45 Mil/uL (ref 3.87–5.11)
RDW: 13.2 % (ref 11.5–14.6)
WBC: 4.6 10*3/uL (ref 4.5–10.5)

## 2013-09-25 LAB — LIPID PANEL
Cholesterol: 185 mg/dL (ref 0–200)
HDL: 47.4 mg/dL (ref >39.00)
LDL Cholesterol: 104 mg/dL — ABNORMAL HIGH (ref 0–99)
Total CHOL/HDL Ratio: 4
Triglycerides: 166 mg/dL — ABNORMAL HIGH (ref 0.0–149.0)
VLDL: 33.2 mg/dL (ref 0.0–40.0)

## 2013-09-26 LAB — VITAMIN D 25 HYDROXY (VIT D DEFICIENCY, FRACTURES): VIT D 25 HYDROXY: 57 ng/mL (ref 30–89)

## 2013-09-30 ENCOUNTER — Ambulatory Visit (INDEPENDENT_AMBULATORY_CARE_PROVIDER_SITE_OTHER): Payer: Medicare Other | Admitting: Family Medicine

## 2013-09-30 ENCOUNTER — Encounter: Payer: Self-pay | Admitting: Family Medicine

## 2013-09-30 VITALS — BP 132/60 | HR 71 | Temp 97.8°F | Ht 65.5 in | Wt 195.5 lb

## 2013-09-30 DIAGNOSIS — E785 Hyperlipidemia, unspecified: Secondary | ICD-10-CM

## 2013-09-30 DIAGNOSIS — Z0000 Encounter for general adult medical examination without abnormal findings: Secondary | ICD-10-CM | POA: Insufficient documentation

## 2013-09-30 DIAGNOSIS — K219 Gastro-esophageal reflux disease without esophagitis: Secondary | ICD-10-CM

## 2013-09-30 DIAGNOSIS — R059 Cough, unspecified: Secondary | ICD-10-CM

## 2013-09-30 DIAGNOSIS — R05 Cough: Secondary | ICD-10-CM

## 2013-09-30 MED ORDER — POTASSIUM CHLORIDE ER 10 MEQ PO TBCR: 20.0000 meq | EXTENDED_RELEASE_TABLET | Freq: Two times a day (BID) | ORAL | Status: DC

## 2013-09-30 MED ORDER — VENLAFAXINE HCL ER 75 MG PO CP24: ORAL_CAPSULE | ORAL | Status: DC

## 2013-09-30 MED ORDER — MELOXICAM 7.5 MG PO TABS: 7.5000 mg | ORAL_TABLET | Freq: Every day | ORAL | Status: DC

## 2013-09-30 MED ORDER — ESOMEPRAZOLE MAGNESIUM 40 MG PO CPDR: 40.0000 mg | DELAYED_RELEASE_CAPSULE | Freq: Two times a day (BID) | ORAL | Status: DC

## 2013-09-30 MED ORDER — HYDROCHLOROTHIAZIDE 25 MG PO TABS: 25.0000 mg | ORAL_TABLET | Freq: Every day | ORAL | Status: DC

## 2013-09-30 NOTE — Patient Instructions (Addendum)
You are due for your 10 year tetanus shot - call your county health dept for that because medicare does not pay for routine tetanus shots  Take care of yourself  Stay active  Low impact exercise is the best for you - like water exercise  Labs look stable      Fall Prevention and Home Safety Falls cause injuries and can affect all age groups. It is possible to use preventive measures to significantly decrease the likelihood of falls. There are many simple measures which can make your home safer and prevent falls. OUTDOORS  Repair cracks and edges of walkways and driveways.  Remove high doorway thresholds.  Trim shrubbery on the main path into your home.  Have good outside lighting.  Clear walkways of tools, rocks, debris, and clutter.  Check that handrails are not broken and are securely fastened. Both sides of steps should have handrails.  Have leaves, snow, and ice cleared regularly.  Use sand or salt on walkways during winter months.  In the garage, clean up grease or oil spills. BATHROOM  Install night lights.  Install grab bars by the toilet and in the tub and shower.  Use non-skid mats or decals in the tub or shower.  Place a plastic non-slip stool in the shower to sit on, if needed.  Keep floors dry and clean up all water on the floor immediately.  Remove soap buildup in the tub or shower on a regular basis.  Secure bath mats with non-slip, double-sided rug tape.  Remove throw rugs and tripping hazards from the floors. BEDROOMS  Install night lights.  Make sure a bedside light is easy to reach.  Do not use oversized bedding.  Keep a telephone by your bedside.  Have a firm chair with side arms to use for getting dressed.  Remove throw rugs and tripping hazards from the floor. KITCHEN  Keep handles on pots and pans turned toward the center of the stove. Use back burners when possible.  Clean up spills quickly and allow time for drying.  Avoid  walking on wet floors.  Avoid hot utensils and knives.  Position shelves so they are not too high or low.  Place commonly used objects within easy reach.  If necessary, use a sturdy step stool with a grab bar when reaching.  Keep electrical cables out of the way.  Do not use floor polish or wax that makes floors slippery. If you must use wax, use non-skid floor wax.  Remove throw rugs and tripping hazards from the floor. STAIRWAYS  Never leave objects on stairs.  Place handrails on both sides of stairways and use them. Fix any loose handrails. Make sure handrails on both sides of the stairways are as long as the stairs.  Check carpeting to make sure it is firmly attached along stairs. Make repairs to worn or loose carpet promptly.  Avoid placing throw rugs at the top or bottom of stairways, or properly secure the rug with carpet tape to prevent slippage. Get rid of throw rugs, if possible.  Have an electrician put in a light switch at the top and bottom of the stairs. OTHER FALL PREVENTION TIPS  Wear low-heel or rubber-soled shoes that are supportive and fit well. Wear closed toe shoes.  When using a stepladder, make sure it is fully opened and both spreaders are firmly locked. Do not climb a closed stepladder.  Add color or contrast paint or tape to grab bars and handrails in your home.  Place contrasting color strips on first and last steps.  Learn and use mobility aids as needed. Install an electrical emergency response system.  Turn on lights to avoid dark areas. Replace light bulbs that burn out immediately. Get light switches that glow.  Arrange furniture to create clear pathways. Keep furniture in the same place.  Firmly attach carpet with non-skid or double-sided tape.  Eliminate uneven floor surfaces.  Select a carpet pattern that does not visually hide the edge of steps.  Be aware of all pets. OTHER HOME SAFETY TIPS  Set the water temperature for 120 F  (48.8 C).  Keep emergency numbers on or near the telephone.  Keep smoke detectors on every level of the home and near sleeping areas. Document Released: 06/15/2002 Document Revised: 12/25/2011 Document Reviewed: 09/14/2011 [facility] Patient Information 2014 [place].

## 2013-09-30 NOTE — Progress Notes (Signed)
Subjective:    Patient ID: Veronica Greene, female    DOB: 06-05-1946, 68 y.o.   MRN: [id number]  HPI I have personally reviewed the Medicare Annual Wellness questionnaire and have noted 1. The patient's medical and social history 2. Their use of alcohol, tobacco or illicit drugs 3. Their current medications and supplements 4. The patient's functional ability including ADL's, fall risks, home safety risks and hearing or visual             impairment. 5. Diet and physical activities 6. Evidence for depression or mood disorders  The patients weight, height, BMI have been recorded in the chart and visual acuity is per eye clinic.  I have made referrals, counseling and provided education to the patient based review of the above and I have provided the pt with a written personalized care plan for preventive services.  See scanned forms.  Routine anticipatory guidance given to patient.  See health maintenance. Colon cancer screening 5/13 - 5 year recall with family history  Breast cancer screening mammogram 10/14  No gyn problems  Self breast exam- no lumps or problems (just soreness after a fall) Flu vaccine -got that in nov  Tetanus vaccine Td 4/05 -is due for that  Pneumovax 2/12  Zoster vaccine 2/10 - utd Advance directive- she has a living will  Cognitive function addressed- see scanned forms- and if abnormal then additional documentation follows. - no problems , she tries to keep her brain active   She plans to retire this summer -she is excited about that   Had one trip and fall - getting used to new shoe with rise (that she wears s/p polio)  PMH and SH reviewed Sister's fam hx kidney issues- stones and ureter problem   Meds, vitals, and allergies reviewed.   ROS: See HPI.  Otherwise negative.      Hx of mild hyperlipidemia Lab Results  Component Value Date   CHOL 185 09/25/2013   CHOL 183 09/02/2012   CHOL 185 08/31/2011   Lab Results  Component Value Date   HDL  47.40 09/25/2013   HDL 47.80 09/02/2012   HDL 46.00 08/31/2011   Lab Results  Component Value Date   LDLCALC 104* 09/25/2013   LDLCALC 108* 09/02/2012   LDLCALC 114* 08/31/2011   Lab Results  Component Value Date   TRIG 166.0* 09/25/2013   TRIG 136.0 09/02/2012   TRIG 124.0 08/31/2011   Lab Results  Component Value Date   CHOLHDL 4 09/25/2013   CHOLHDL 4 09/02/2012   CHOLHDL 4 08/31/2011   No results found for this basename: LDLDIRECT   cholesterol is fairly stable  She watches diet except for ice cream   dexa 10/13 normal range  Takes her ca and D   Lab: Results for orders placed in visit on 09/25/13  LIPID PANEL      Result Value Ref Range   Cholesterol 185  0 - 200 mg/dL   Triglycerides 166.0 (*) 0.0 - 149.0 mg/dL   HDL 47.40  >39.00 mg/dL   VLDL 33.2  0.0 - 40.0 mg/dL   LDL Cholesterol 104 (*) 0 - 99 mg/dL   Total CHOL/HDL Ratio 4    COMPREHENSIVE METABOLIC PANEL      Result Value Ref Range   Sodium 139  135 - 145 mEq/L   Potassium 4.2  3.5 - 5.1 mEq/L   Chloride 104  96 - 112 mEq/L   CO2 32  19 - 32 mEq/L  Glucose, Bld 96  70 - 99 mg/dL   BUN 11  6 - 23 mg/dL   Creatinine, Ser 0.7  0.4 - 1.2 mg/dL   Total Bilirubin 0.8  0.3 - 1.2 mg/dL   Alkaline Phosphatase 58  39 - 117 U/L   AST 19  0 - 37 U/L   ALT 20  0 - 35 U/L   Total Protein 6.7  6.0 - 8.3 g/dL   Albumin 4.4  3.5 - 5.2 g/dL   Calcium 9.3  8.4 - 10.5 mg/dL   GFR 84.12  >60.00 mL/min  TSH      Result Value Ref Range   TSH 2.12  0.35 - 5.50 uIU/mL  CBC WITH DIFFERENTIAL      Result Value Ref Range   WBC 4.6  4.5 - 10.5 K/uL   RBC 4.45  3.87 - 5.11 Mil/uL   Hemoglobin 13.6  12.0 - 15.0 g/dL   HCT 39.5  36.0 - 46.0 %   MCV 88.7  78.0 - 100.0 fl   MCHC 34.4  30.0 - 36.0 g/dL   RDW 13.2  11.5 - 14.6 %   Platelets 198.0  150.0 - 400.0 K/uL   Neutrophils Relative % 62.2  43.0 - 77.0 %   Lymphocytes Relative 30.6  12.0 - 46.0 %   Monocytes Relative 3.9  3.0 - 12.0 %   Eosinophils Relative 2.9  0.0 - 5.0  %   Basophils Relative 0.4  0.0 - 3.0 %   Neutro Abs 2.8  1.4 - 7.7 K/uL   Lymphs Abs 1.4  0.7 - 4.0 K/uL   Monocytes Absolute 0.2  0.1 - 1.0 K/uL   Eosinophils Absolute 0.1  0.0 - 0.7 K/uL   Basophils Absolute 0.0  0.0 - 0.1 K/uL  VITAMIN D 25 HYDROXY      Result Value Ref Range   Vit D, 25-Hydroxy 57  30 - 89 ng/mL      Review of Systems Review of Systems  Constitutional: Negative for fever, appetite change, fatigue and unexpected weight change.  Eyes: Negative for pain and visual disturbance.  Respiratory: Negative for cough and shortness of breath.   Cardiovascular: Negative for cp or palpitations    Gastrointestinal: Negative for nausea, diarrhea and constipation.  Genitourinary: Negative for urgency and frequency.  Skin: Negative for pallor or rash   Neurological: Negative for weakness, light-headedness, numbness and headaches.  Hematological: Negative for adenopathy. Does not bruise/bleed easily.  Psychiatric/Behavioral: Negative for dysphoric mood. The patient is not nervous/anxious.         Objective:   Physical Exam  Constitutional: She appears well-developed and well-nourished. No distress.  obese and well appearing   HENT:  Head: Normocephalic and atraumatic.  Right Ear: External ear normal.  Left Ear: External ear normal.  Mouth/Throat: Oropharynx is clear and moist.  Eyes: Conjunctivae and EOM are normal. Pupils are equal, round, and reactive to light. No scleral icterus.  Neck: Normal range of motion. Neck supple. No JVD present. Carotid bruit is not present. No thyromegaly present.  Cardiovascular: Normal rate, regular rhythm, normal heart sounds and intact distal pulses.  Exam reveals no gallop.   Pulmonary/Chest: Effort normal and breath sounds normal. No respiratory distress. She has no wheezes. She exhibits no tenderness.  Abdominal: Soft. Bowel sounds are normal. She exhibits no distension, no abdominal bruit and no mass. There is no tenderness.    Genitourinary: No breast swelling, tenderness, discharge or bleeding.  Breast exam: No mass,  nodules, thickening, tenderness, bulging, retraction, inflamation, nipple discharge or skin changes noted.  No axillary or clavicular LA.      Musculoskeletal: Normal range of motion. She exhibits no edema and no tenderness.  Leg length disc noted from polio  Lymphadenopathy:    She has no cervical adenopathy.  Neurological: She is alert. She has normal reflexes. No cranial nerve deficit. She exhibits normal muscle tone. Coordination normal.  Skin: Skin is warm and dry. No rash noted. No erythema. No pallor.  Psychiatric: She has a normal mood and affect.          Assessment & Plan:

## 2013-09-30 NOTE — Progress Notes (Signed)
Pre visit review using our clinic review tool, if applicable. No additional management support is needed unless otherwise documented below in the visit note. 

## 2013-10-01 NOTE — Assessment & Plan Note (Signed)
Disc goals for lipids and reasons to control them Rev labs with pt Rev low sat fat diet in detail  Disc reducing high fat foods like ice cream in her diet

## 2013-10-01 NOTE — Assessment & Plan Note (Signed)
Improved with bid nexium

## 2013-10-01 NOTE — Assessment & Plan Note (Signed)
Refilled nexium  Cough is controlled if she takes it bid (symptoms return with qd dosing)

## 2013-10-01 NOTE — Assessment & Plan Note (Signed)
Reviewed health habits including diet and exercise and skin cancer prevention Reviewed appropriate screening tests for age  Also reviewed health mt list, fam hx and immunization status , as well as social and family history   See HPI Labs rev in detail  

## 2013-10-05 ENCOUNTER — Other Ambulatory Visit: Payer: Self-pay | Admitting: Family Medicine

## 2013-11-12 ENCOUNTER — Telehealth: Payer: Self-pay

## 2013-11-12 NOTE — Telephone Encounter (Signed)
Pt advise we have no more samples

## 2013-11-12 NOTE — Telephone Encounter (Signed)
Pt left v/m; pt is presently in [facility]ut hole and request samples of Nexium if available. Pt will be in office area later today and request cb.

## 2013-11-12 NOTE — Telephone Encounter (Signed)
If we have any left she is welcome to them- I know we are phasing out samples

## 2013-11-26 ENCOUNTER — Other Ambulatory Visit: Payer: Self-pay | Admitting: *Deleted

## 2013-11-26 MED ORDER — LUBIPROSTONE 24 MCG PO CAPS: ORAL_CAPSULE | ORAL | Status: DC

## 2013-12-31 ENCOUNTER — Other Ambulatory Visit: Payer: Self-pay

## 2013-12-31 DIAGNOSIS — Z1231 Encounter for screening mammogram for malignant neoplasm of breast: Secondary | ICD-10-CM

## 2014-01-20 ENCOUNTER — Other Ambulatory Visit: Payer: Medicare Other

## 2014-01-27 ENCOUNTER — Other Ambulatory Visit: Payer: Self-pay | Admitting: Family Medicine

## 2014-01-27 ENCOUNTER — Encounter: Payer: Self-pay | Admitting: Family Medicine

## 2014-01-27 ENCOUNTER — Ambulatory Visit (INDEPENDENT_AMBULATORY_CARE_PROVIDER_SITE_OTHER): Payer: Medicare Other | Admitting: Family Medicine

## 2014-01-27 ENCOUNTER — Encounter: Payer: Medicare Other | Admitting: Family Medicine

## 2014-01-27 VITALS — BP 146/64 | HR 77 | Temp 98.3°F | Ht 65.5 in | Wt 196.8 lb

## 2014-01-27 DIAGNOSIS — M5136 Other intervertebral disc degeneration, lumbar region: Secondary | ICD-10-CM

## 2014-01-27 DIAGNOSIS — M51369 Other intervertebral disc degeneration, lumbar region without mention of lumbar back pain or lower extremity pain: Secondary | ICD-10-CM

## 2014-01-27 DIAGNOSIS — R109 Unspecified abdominal pain: Secondary | ICD-10-CM

## 2014-01-27 DIAGNOSIS — M5137 Other intervertebral disc degeneration, lumbosacral region: Secondary | ICD-10-CM

## 2014-01-27 LAB — POCT URINALYSIS DIPSTICK
Bilirubin, UA: NEGATIVE
Glucose, UA: NEGATIVE
Ketones, UA: NEGATIVE
Leukocytes, UA: NEGATIVE
Nitrite, UA: NEGATIVE
PH UA: 5.5
Protein, UA: NEGATIVE
SPEC GRAV UA: 1.02
Urobilinogen, UA: 0.2

## 2014-01-27 MED ORDER — HYDROCODONE-ACETAMINOPHEN 5-325 MG PO TABS: 1.0000 | ORAL_TABLET | Freq: Four times a day (QID) | ORAL | Status: DC | PRN

## 2014-01-27 MED ORDER — PREDNISONE 10 MG PO TABS
ORAL_TABLET | ORAL | Status: DC
Start: 2014-01-27 — End: 2014-06-18

## 2014-01-27 NOTE — Patient Instructions (Signed)
Take the prednisone as directed  Use the pain pill (norco) sparingly -only when needed with caution  Use heat and cold on your back if helpful

## 2014-01-27 NOTE — Progress Notes (Signed)
Pre visit review using our clinic review tool, if applicable. No additional management support is needed unless otherwise documented below in the visit note. 

## 2014-01-27 NOTE — Telephone Encounter (Signed)
Refill request for amitiza, Rx declined due to being to early (refilled on 11/26/13 #180 with 1 additional refill)

## 2014-01-27 NOTE — Progress Notes (Signed)
Subjective:    Patient ID: Veronica Greene, female    DOB: 08/28/1945, 68 y.o.   MRN: [id number]  HPI Here with pain in her left low back and hip  Terrible pain  Worse if she bends over forward (backwards is ok)   Took a hydrocodone this am - from visit last year with Dr Damita Dunnings  This is recurrent  Flexeril helped a bit but not tylenol or not mobic   Also went to Dr Murphy/ortho- and dx with deg lumbar dz   (she also has it in her neck) He gave her prednisone and then mobic  She improved with that -did not end up needing MRI   Has been doing things to aggrivate it  Retired/ went on vaca and camping - a lot of driving and set up and lifting , also worked in Programmer, multimedia bed and vaccumed car   She has also started walking for exercise   Patient Active Problem List   Diagnosis Date Noted  . Encounter for Medicare annual wellness exam 09/30/2013  . Cough 08/24/2013  . Mid back pain on left side 06/01/2013  . Back pain 02/16/2013  . Contact dermatitis 10/30/2012  . Hyperlipidemia, mild 09/02/2012  . Other screening mammogram 12/12/2011  . Screening for lipoid disorders 08/31/2011  . Degenerative disk disease 08/31/2011  . Hypokalemia 01/02/2011  . Personal history of colonic polyps 11/03/2010  . Constipation, slow transit 11/03/2010  . Benign neoplasm of colon 11/03/2010  . Melanosis coli 11/03/2010  . GOUT, UNSPECIFIED 08/25/2010  . DERMATITIS, SEBORRHEIC 08/25/2010  . OSTEOPENIA 09/05/2007  . History of poliomyelitis 08/26/2007  . OBESITY 08/26/2007  . ANXIETY 08/26/2007  . DEPRESSION 08/26/2007  . ALLERGIC RHINITIS 08/26/2007  . GERD 08/26/2007  . IBS 08/26/2007  . OVERACTIVE BLADDER 08/26/2007  . OSTEOARTHRITIS, HANDS, BILATERAL 08/26/2007  . FIBROMYALGIA 08/26/2007  . DIZZINESS OR VERTIGO 08/26/2007  . EDEMA, CHRONIC 08/26/2007  . URINARY INCONTINENCE 08/26/2007  . HEMATURIA, HX OF 08/26/2007   Past Medical History  Diagnosis Date  . Allergy     allergic rhinitis    . GERD (gastroesophageal reflux disease)   . Anxiety   . Hypertension   . Depression   . Dizziness   . Urinary incontinence   . Osteopenia   . Cataracts, bilateral     early cataracts  . Gout   . Sinusitis, chronic   . IBS (irritable bowel syndrome)     with constipation  . Arthritis    Past Surgical History  Procedure Laterality Date  . Hand surgery      Bil  . Carpal tunnel release      Bil  . Thumb arthroscopy      Bil/  . Tonsillectomy      68 years old  . Colonoscopy  11/09/2011  . Polio surg    . Ankle surgery      right  . Colonoscopy w/ polypectomy  10/21/2009  . Nasal sinus surgery  1976  . Knee surgery  1998    x2   History  Substance Use Topics  . Smoking status: Never Smoker   . Smokeless tobacco: Never Used  . Alcohol Use: No   Family History  Problem Relation Age of Onset  . Kidney disease Mother   . Stroke Father   . Colon cancer Father   . Kidney disease Sister   . Cancer Paternal Aunt     COLON CA   Allergies  Allergen Reactions  . Clarithromycin  REACTION: throat swells  . Codeine     REACTION: nausea and vomiting  . Paroxetine     REACTION: doesn't work  . Penicillins     REACTION: whelps   Current Outpatient Prescriptions on File Prior to Visit  Medication Sig Dispense Refill  . Ascorbic Acid (VITAMIN C) 1000 MG tablet Take 1,000 mg by mouth daily.        Marland Kitchen aspirin 81 MG EC tablet Take 81 mg by mouth daily.        . benzonatate (TESSALON) 200 MG capsule Take 1 capsule (200 mg total) by mouth 3 (three) times daily as needed for cough (swallow whole as needed for cough).  30 capsule  0  . Calcium Carbonate-Vitamin D (CALCIUM-VITAMIN D) 500-200 MG-UNIT per tablet Take 1 tablet by mouth daily. Take 400 units daily       . cholecalciferol (VITAMIN D) 1000 UNITS tablet Take 1,000 Units by mouth daily.      . colchicine 0.6 MG tablet Take 0.6 mg by mouth as needed.      . cyclobenzaprine (FLEXERIL) 10 MG tablet Take 1 tablet (10 mg  total) by mouth 3 (three) times daily as needed for muscle spasms.  30 tablet  1  . esomeprazole (NEXIUM) 40 MG capsule Take 1 capsule (40 mg total) by mouth 2 (two) times daily.  180 capsule  3  . guaiFENesin (MUCINEX) 600 MG 12 hr tablet Take 1,200 mg by mouth as needed.        . hydrochlorothiazide (HYDRODIURIL) 25 MG tablet Take 1 tablet (25 mg total) by mouth daily.  90 tablet  3  . lubiprostone (AMITIZA) 24 MCG capsule TAKE 1 CAPSULE BY MOUTH TWICE A DAY.  180 capsule  1  . meloxicam (MOBIC) 7.5 MG tablet Take 1 tablet (7.5 mg total) by mouth daily.  90 tablet  3  . mometasone (NASONEX) 50 MCG/ACT nasal spray Place 2 sprays into the nose daily.  51 g  3  . Multiple Vitamin (MULTIVITAMIN) tablet Take 1 tablet by mouth daily.        . potassium chloride (K-DUR) 10 MEQ tablet Take 2 tablets (20 mEq total) by mouth 2 (two) times daily.  360 tablet  3  . venlafaxine XR (EFFEXOR-XR) 75 MG 24 hr capsule TAKE 1 CAPSULE BY MOUTH ONCE DAILY.  90 capsule  3  . scopolamine (TRANSDERM-SCOP) 1.5 MG Place 1 patch (1.5 mg total) onto the skin every 3 (three) days.  10 patch  0   No current facility-administered medications on file prior to visit.    Review of Systems Review of Systems  Constitutional: Negative for fever, appetite change, fatigue and unexpected weight change.  Eyes: Negative for pain and visual disturbance.  Respiratory: Negative for cough and shortness of breath.   Cardiovascular: Negative for cp or palpitations    Gastrointestinal: Negative for nausea, diarrhea and constipation.  Genitourinary: Negative for urgency and frequency.  Skin: Negative for pallor or rash   MSK pos for L mid and low back pain with rad to the hip L Neurological: Negative for weakness, light-headedness, numbness and headaches.  Hematological: Negative for adenopathy. Does not bruise/bleed easily.  Psychiatric/Behavioral: Negative for dysphoric mood. The patient is not nervous/anxious.         Objective:     Physical Exam  Constitutional: She appears well-developed and well-nourished. No distress.  obese and well appearing   HENT:  Head: Normocephalic and atraumatic.  Eyes: Conjunctivae and EOM are normal.  Pupils are equal, round, and reactive to light.  Neck: Normal range of motion. Neck supple.  Cardiovascular: Normal rate and regular rhythm.   Pulmonary/Chest: Effort normal and breath sounds normal.  Musculoskeletal: She exhibits tenderness. She exhibits no edema.       Lumbar back: She exhibits decreased range of motion and tenderness. She exhibits no swelling, no edema and no deformity.  Tender L para lumbar musculature No spinous process tenderness Pos SLR for back/ buttock but not leg pain bilat  Nl rom hips  slt L troch tenderness   No neuro changes   Baseline leg length discrepency noted   Lymphadenopathy:    She has no cervical adenopathy.  Neurological: She is alert. She has normal reflexes. She displays no atrophy. No cranial nerve deficit or sensory deficit. She exhibits normal muscle tone. Coordination and gait normal.  Skin: Skin is dry. No rash noted. No erythema.  Psychiatric: She has a normal mood and affect.          Assessment & Plan:   Problem List Items Addressed This Visit     Musculoskeletal and Integument   Degenerative disk disease     L sided sciatica type symptoms w/o neuro findings tx with prednisone taper  Heat and ice Given handout for exercises-consider PT  Update if no imp  One refill of norco- use with caution    Relevant Medications      predniSONE (DELTASONE) tablet      NORCO 5-325 MG PO TABS    Other Visit Diagnoses   Flank pain    -  Primary    Relevant Orders       POCT urinalysis dipstick (Completed)

## 2014-01-27 NOTE — Assessment & Plan Note (Signed)
L sided sciatica type symptoms w/o neuro findings tx with prednisone taper  Heat and ice Given handout for exercises-consider PT  Update if no imp  One refill of norco- use with caution

## 2014-03-31 ENCOUNTER — Other Ambulatory Visit: Payer: Self-pay | Admitting: Obstetrics and Gynecology

## 2014-03-31 ENCOUNTER — Other Ambulatory Visit (HOSPITAL_COMMUNITY)
Admission: RE | Admit: 2014-03-31 | Discharge: 2014-03-31 | Disposition: A | Discharge status: Home / Self Care | Payer: Medicare Other | Source: Ambulatory Visit | Attending: Obstetrics and Gynecology | Admitting: Obstetrics and Gynecology

## 2014-03-31 DIAGNOSIS — Z124 Encounter for screening for malignant neoplasm of cervix: Secondary | ICD-10-CM | POA: Insufficient documentation

## 2014-04-02 LAB — CYTOLOGY - PAP

## 2014-04-09 ENCOUNTER — Other Ambulatory Visit: Payer: Self-pay | Admitting: Family Medicine

## 2014-04-09 NOTE — Telephone Encounter (Signed)
Electronic refill request, please advise  

## 2014-04-11 NOTE — Telephone Encounter (Signed)
Will refill electronically  

## 2014-04-29 ENCOUNTER — Ambulatory Visit
Admission: RE | Admit: 2014-04-29 | Discharge: 2014-04-29 | Disposition: A | Discharge status: Home / Self Care | Payer: Medicare Other | Source: Ambulatory Visit

## 2014-04-29 DIAGNOSIS — Z1231 Encounter for screening mammogram for malignant neoplasm of breast: Secondary | ICD-10-CM

## 2014-05-03 ENCOUNTER — Encounter: Payer: Self-pay | Admitting: *Deleted

## 2014-06-18 ENCOUNTER — Ambulatory Visit (INDEPENDENT_AMBULATORY_CARE_PROVIDER_SITE_OTHER): Payer: Medicare Other | Admitting: Family Medicine

## 2014-06-18 ENCOUNTER — Encounter: Payer: Self-pay | Admitting: Family Medicine

## 2014-06-18 VITALS — BP 146/70 | HR 65 | Temp 97.9°F | Ht 65.5 in | Wt 192.0 lb

## 2014-06-18 DIAGNOSIS — R0981 Nasal congestion: Secondary | ICD-10-CM

## 2014-06-18 DIAGNOSIS — H8110 Benign paroxysmal vertigo, unspecified ear: Secondary | ICD-10-CM | POA: Insufficient documentation

## 2014-06-18 DIAGNOSIS — H8113 Benign paroxysmal vertigo, bilateral: Secondary | ICD-10-CM

## 2014-06-18 MED ORDER — MECLIZINE HCL 12.5 MG PO TABS: 12.5000 mg | ORAL_TABLET | Freq: Three times a day (TID) | ORAL | Status: DC | PRN

## 2014-06-18 NOTE — Progress Notes (Signed)
Pre visit review using our clinic review tool, if applicable. No additional management support is needed unless otherwise documented below in the visit note. 

## 2014-06-18 NOTE — Progress Notes (Signed)
Subjective:    Patient ID: Veronica Greene, female    DOB: 1946/03/12, 68 y.o.   MRN: [id number]  HPI Here with dizziness  Got up Saturday morning with "dizzy head" and nausea  Lasted all day  On and off since then  She took some bonine over the counter - Sat- helped a bit and it did sedate her  Then bought some dramamine (knocked her through a loop on that night)   Has had vertigo before -in 2011 (was severe)   The dizziness worsens with positional change -moving head or bending over  Has to stand for a minute after standing  A little worse now than this am   Last dose of dramamine was Monday night   Has not had a cold  However some sinus headaches   Was recently traveling - so change in pressure /temp [facility])  Felt like R ear was full (? Fluid)  mucinex was not helpful once daily  Added zyrtec nightly  nasonex - not using it    Patient Active Problem List   Diagnosis Date Noted  . Encounter for Medicare annual wellness exam 09/30/2013  . Cough 08/24/2013  . Mid back pain on left side 06/01/2013  . Back pain 02/16/2013  . Contact dermatitis 10/30/2012  . Hyperlipidemia, mild 09/02/2012  . Other screening mammogram 12/12/2011  . Screening for lipoid disorders 08/31/2011  . Degenerative disk disease 08/31/2011  . Hypokalemia 01/02/2011  . Personal history of colonic polyps 11/03/2010  . Constipation, slow transit 11/03/2010  . Benign neoplasm of colon 11/03/2010  . Melanosis coli 11/03/2010  . GOUT, UNSPECIFIED 08/25/2010  . DERMATITIS, SEBORRHEIC 08/25/2010  . OSTEOPENIA 09/05/2007  . History of poliomyelitis 08/26/2007  . OBESITY 08/26/2007  . ANXIETY 08/26/2007  . DEPRESSION 08/26/2007  . ALLERGIC RHINITIS 08/26/2007  . GERD 08/26/2007  . IBS 08/26/2007  . OVERACTIVE BLADDER 08/26/2007  . OSTEOARTHRITIS, HANDS, BILATERAL 08/26/2007  . FIBROMYALGIA 08/26/2007  . DIZZINESS OR VERTIGO 08/26/2007  . EDEMA, CHRONIC 08/26/2007  . URINARY INCONTINENCE  08/26/2007  . HEMATURIA, HX OF 08/26/2007   Past Medical History  Diagnosis Date  . Allergy     allergic rhinitis  . GERD (gastroesophageal reflux disease)   . Anxiety   . Hypertension   . Depression   . Dizziness   . Urinary incontinence   . Osteopenia   . Cataracts, bilateral     early cataracts  . Gout   . Sinusitis, chronic   . IBS (irritable bowel syndrome)     with constipation  . Arthritis    Past Surgical History  Procedure Laterality Date  . Hand surgery      Bil  . Carpal tunnel release      Bil  . Thumb arthroscopy      Bil/  . Tonsillectomy      68 years old  . Colonoscopy  11/09/2011  . Polio surg    . Ankle surgery      right  . Colonoscopy w/ polypectomy  10/21/2009  . Nasal sinus surgery  1976  . Knee surgery  1998    x2   History  Substance Use Topics  . Smoking status: Never Smoker   . Smokeless tobacco: Never Used  . Alcohol Use: No   Family History  Problem Relation Age of Onset  . Kidney disease Mother   . Stroke Father   . Colon cancer Father   . Kidney disease Sister   . Cancer Paternal  Aunt     COLON CA   Allergies  Allergen Reactions  . Clarithromycin     REACTION: throat swells  . Codeine     REACTION: nausea and vomiting  . Paroxetine     REACTION: doesn't work  . Penicillins     REACTION: whelps   Current Outpatient Prescriptions on File Prior to Visit  Medication Sig Dispense Refill  . Ascorbic Acid (VITAMIN C) 1000 MG tablet Take 1,000 mg by mouth daily.      Marland Kitchen aspirin 81 MG EC tablet Take 81 mg by mouth daily.      . benzonatate (TESSALON) 200 MG capsule Take 1 capsule (200 mg total) by mouth 3 (three) times daily as needed for cough (swallow whole as needed for cough). 30 capsule 0  . Calcium Carbonate-Vitamin D (CALCIUM-VITAMIN D) 500-200 MG-UNIT per tablet Take 1 tablet by mouth daily. Take 400 units daily     . cholecalciferol (VITAMIN D) 1000 UNITS tablet Take 1,000 Units by mouth daily.    . cyclobenzaprine  (FLEXERIL) 10 MG tablet Take 1 tablet (10 mg total) by mouth 3 (three) times daily as needed for muscle spasms. 30 tablet 1  . esomeprazole (NEXIUM) 40 MG capsule Take 1 capsule (40 mg total) by mouth 2 (two) times daily. 180 capsule 3  . guaiFENesin (MUCINEX) 600 MG 12 hr tablet Take 1,200 mg by mouth as needed.      . hydrochlorothiazide (HYDRODIURIL) 25 MG tablet Take 1 tablet (25 mg total) by mouth daily. 90 tablet 3  . HYDROcodone-acetaminophen (NORCO) 5-325 MG per tablet Take 1 tablet by mouth every 6 (six) hours as needed for moderate pain. 30 tablet 0  . lubiprostone (AMITIZA) 24 MCG capsule TAKE 1 CAPSULE BY MOUTH TWICE A DAY. 180 capsule 1  . meloxicam (MOBIC) 7.5 MG tablet TAKE 1 TABLET BY MOUTH DAILY WITH FOOD. 90 tablet 1  . mometasone (NASONEX) 50 MCG/ACT nasal spray Place 2 sprays into the nose daily. 51 g 3  . Multiple Vitamin (MULTIVITAMIN) tablet Take 1 tablet by mouth daily.      . potassium chloride (K-DUR) 10 MEQ tablet Take 2 tablets (20 mEq total) by mouth 2 (two) times daily. 360 tablet 3  . venlafaxine XR (EFFEXOR-XR) 75 MG 24 hr capsule TAKE 1 CAPSULE BY MOUTH ONCE DAILY. 90 capsule 3  . colchicine 0.6 MG tablet Take 0.6 mg by mouth as needed.    Marland Kitchen scopolamine (TRANSDERM-SCOP) 1.5 MG Place 1 patch (1.5 mg total) onto the skin every 3 (three) days. (Patient not taking: Reported on 06/18/2014) 10 patch 0   No current facility-administered medications on file prior to visit.    Review of Systems Review of Systems  Constitutional: Negative for fever, appetite change, and unexpected weight change.  Eyes: Negative for pain and visual disturbance.  ENT pos for cong/ neg for ear pain Respiratory: Negative for cough and shortness of breath.   Cardiovascular: Negative for cp or palpitations    Gastrointestinal: Negative for , diarrhea and constipation. neg for vomiting  Genitourinary: Negative for urgency and frequency.  Skin: Negative for pallor or rash   Neurological:  Negative for weakness, numbness and headaches. pos for dizziness caused by pos change  Hematological: Negative for adenopathy. Does not bruise/bleed easily.  Psychiatric/Behavioral: Negative for dysphoric mood. The patient is not nervous/anxious.         Objective:   Physical Exam  Constitutional: She is oriented to person, place, and time. She appears well-developed  and well-nourished. No distress.  obese and well appearing   HENT:  Head: Normocephalic and atraumatic.  Right Ear: External ear normal.  Left Ear: External ear normal.  Nares are mildly congested TMs are dull  Eyes: Conjunctivae and EOM are normal. Pupils are equal, round, and reactive to light. Right eye exhibits no discharge. Left eye exhibits no discharge.  3-4 beats of horiz nystagmus bilat   Neck: Normal range of motion. Neck supple.  Cardiovascular: Normal rate, regular rhythm and normal heart sounds.   Pulmonary/Chest: Effort normal and breath sounds normal. No respiratory distress.  Musculoskeletal: She exhibits no edema.  Lymphadenopathy:    She has no cervical adenopathy.  Neurological: She is alert and oriented to person, place, and time. She has normal reflexes. She displays no atrophy and no tremor. No cranial nerve deficit or sensory deficit. She exhibits normal muscle tone. Coordination and gait normal.  Skin: Skin is warm and dry. No rash noted. No pallor.  Psychiatric: She has a normal mood and affect.          Assessment & Plan:   Problem List Items Addressed This Visit      Respiratory   Sinus congestion    This could have a role in her vertigo  Enc to use nasonex every day instead of prn      Nervous and Auditory   BPV (benign positional vertigo) - Primary    Reassuring exam  Px meclizine to use atc for at least 2 d (warned of sedation)  And then prn after that if improved  Disc moving slowly/ not turning head quickly/ also safety to prev falls If no imp consider ENT consult    Update if not starting to improve in a week or if worsening

## 2014-06-18 NOTE — Patient Instructions (Signed)
I think you have positional vertigo  Take meclizine (it is sedating) - three times daily for the next 2 days and then use it as needed  Start using nasonex every day as directed  Update if not starting to improve in a week or if worsening  - or if worse facial headache or any fever     Benign Positional Vertigo Vertigo means you feel like you or your surroundings are moving when they are not. Benign positional vertigo is the most common form of vertigo. Benign means that the cause of your condition is not serious. Benign positional vertigo is more common in older adults. CAUSES  Benign positional vertigo is the result of an upset in the labyrinth system. This is an area in the middle ear that helps control your balance. This may be caused by a viral infection, head injury, or repetitive motion. However, often no specific cause is found. SYMPTOMS  Symptoms of benign positional vertigo occur when you move your head or eyes in different directions. Some of the symptoms may include:  Loss of balance and falls.  Vomiting.  Blurred vision.  Dizziness.  Nausea.  Involuntary eye movements (nystagmus). DIAGNOSIS  Benign positional vertigo is usually diagnosed by physical exam. If the specific cause of your benign positional vertigo is unknown, your caregiver may perform imaging tests, such as magnetic resonance imaging (MRI) or computed tomography (CT). TREATMENT  Your caregiver may recommend movements or procedures to correct the benign positional vertigo. Medicines such as meclizine, benzodiazepines, and medicines for nausea may be used to treat your symptoms. In rare cases, if your symptoms are caused by certain conditions that affect the inner ear, you may need surgery. HOME CARE INSTRUCTIONS   Follow your caregiver's instructions.  Move slowly. Do not make sudden body or head movements.  Avoid driving.  Avoid operating heavy machinery.  Avoid performing any tasks that would be  dangerous to you or others during a vertigo episode.  Drink enough fluids to keep your urine clear or pale yellow. SEEK IMMEDIATE MEDICAL CARE IF:   You develop problems with walking, weakness, numbness, or using your arms, hands, or legs.  You have difficulty speaking.  You develop severe headaches.  Your nausea or vomiting continues or gets worse.  You develop visual changes.  Your family or friends notice any behavioral changes.  Your condition gets worse.  You have a fever.  You develop a stiff neck or sensitivity to light. MAKE SURE YOU:   Understand these instructions.  Will watch your condition.  Will get help right away if you are not doing well or get worse. Document Released: 04/02/2006 Document Revised: 09/17/2011 Document Reviewed: 03/15/2011 [facility] Patient Information 2015 [place], [place]. This information is not intended to replace advice given to you by your health care provider. Make sure you discuss any questions you have with your health care provider.

## 2014-06-20 NOTE — Assessment & Plan Note (Signed)
This could have a role in her vertigo  Enc to use nasonex every day instead of prn

## 2014-06-20 NOTE — Assessment & Plan Note (Signed)
Reassuring exam  Px meclizine to use atc for at least 2 d (warned of sedation)  And then prn after that if improved  Disc moving slowly/ not turning head quickly/ also safety to prev falls If no imp consider ENT consult   Update if not starting to improve in a week or if worsening

## 2014-07-27 ENCOUNTER — Other Ambulatory Visit: Payer: Self-pay | Admitting: Family Medicine

## 2014-09-13 ENCOUNTER — Encounter: Payer: Self-pay | Admitting: Family Medicine

## 2014-09-13 ENCOUNTER — Ambulatory Visit (INDEPENDENT_AMBULATORY_CARE_PROVIDER_SITE_OTHER): Payer: PPO | Admitting: Family Medicine

## 2014-09-13 VITALS — BP 116/78 | HR 83 | Temp 98.3°F | Ht 65.75 in | Wt 193.0 lb

## 2014-09-13 DIAGNOSIS — J0100 Acute maxillary sinusitis, unspecified: Secondary | ICD-10-CM

## 2014-09-13 DIAGNOSIS — J019 Acute sinusitis, unspecified: Secondary | ICD-10-CM | POA: Insufficient documentation

## 2014-09-13 MED ORDER — HYDROCODONE-HOMATROPINE 5-1.5 MG/5ML PO SYRP: 5.0000 mL | ORAL_SOLUTION | Freq: Three times a day (TID) | ORAL | Status: DC | PRN

## 2014-09-13 MED ORDER — LEVOFLOXACIN 500 MG PO TABS: 500.0000 mg | ORAL_TABLET | Freq: Every day | ORAL | Status: DC

## 2014-09-13 NOTE — Assessment & Plan Note (Signed)
After [employer] with levaquin (pt dislikes augmentin) Fluids and rest  Disc symptomatic care - see instructions on AVS  Hycodan for cough with caution of sedation Update if not starting to improve in a week or if worsening

## 2014-09-13 NOTE — Progress Notes (Signed)
Subjective:    Patient ID: Veronica Greene, female    DOB: July 12, 1945, 69 y.o.   MRN: [id number]  HPI Here with uri symptoms   Has had "crud" ST got worse along with cough- since Friday  Some yellow phlegm as well  Chest is sore  No fever that she knows of (had chills one night)  Taking mucinex otc - either DM or plain   Uses her nasal spray also saline   Very congested  Some sinus pressure last week   Patient Active Problem List   Diagnosis Date Noted  . BPV (benign positional vertigo) 06/18/2014  . Sinus congestion 06/18/2014  . Encounter for Medicare annual wellness exam 09/30/2013  . Cough 08/24/2013  . Mid back pain on left side 06/01/2013  . Back pain 02/16/2013  . Contact dermatitis 10/30/2012  . Hyperlipidemia, mild 09/02/2012  . Other screening mammogram 12/12/2011  . Screening for lipoid disorders 08/31/2011  . Degenerative disk disease 08/31/2011  . Hypokalemia 01/02/2011  . Personal history of colonic polyps 11/03/2010  . Constipation, slow transit 11/03/2010  . Benign neoplasm of colon 11/03/2010  . Melanosis coli 11/03/2010  . GOUT, UNSPECIFIED 08/25/2010  . DERMATITIS, SEBORRHEIC 08/25/2010  . OSTEOPENIA 09/05/2007  . History of poliomyelitis 08/26/2007  . OBESITY 08/26/2007  . ANXIETY 08/26/2007  . DEPRESSION 08/26/2007  . ALLERGIC RHINITIS 08/26/2007  . GERD 08/26/2007  . IBS 08/26/2007  . OVERACTIVE BLADDER 08/26/2007  . OSTEOARTHRITIS, HANDS, BILATERAL 08/26/2007  . FIBROMYALGIA 08/26/2007  . DIZZINESS OR VERTIGO 08/26/2007  . EDEMA, CHRONIC 08/26/2007  . URINARY INCONTINENCE 08/26/2007  . HEMATURIA, HX OF 08/26/2007   Past Medical History  Diagnosis Date  . Allergy     allergic rhinitis  . GERD (gastroesophageal reflux disease)   . Anxiety   . Hypertension   . Depression   . Dizziness   . Urinary incontinence   . Osteopenia   . Cataracts, bilateral     early cataracts  . Gout   . Sinusitis, chronic   . IBS (irritable bowel  syndrome)     with constipation  . Arthritis    Past Surgical History  Procedure Laterality Date  . Hand surgery      Bil  . Carpal tunnel release      Bil  . Thumb arthroscopy      Bil/  . Tonsillectomy      69 years old  . Colonoscopy  11/09/2011  . Polio surg    . Ankle surgery      right  . Colonoscopy w/ polypectomy  10/21/2009  . Nasal sinus surgery  1976  . Knee surgery  1998    x2   History  Substance Use Topics  . Smoking status: Never Smoker   . Smokeless tobacco: Never Used  . Alcohol Use: No   Family History  Problem Relation Age of Onset  . Kidney disease Mother   . Stroke Father   . Colon cancer Father   . Kidney disease Sister   . Cancer Paternal Aunt     COLON CA   Allergies  Allergen Reactions  . Clarithromycin     REACTION: throat swells  . Codeine     REACTION: nausea and vomiting  . Paroxetine     REACTION: doesn't work  . Penicillins     REACTION: whelps   Current Outpatient Prescriptions on File Prior to Visit  Medication Sig Dispense Refill  . AMITIZA 24 MCG capsule TAKE 1 CAPSULE  BY MOUTH TWICE A DAY. 180 capsule 0  . Ascorbic Acid (VITAMIN C) 1000 MG tablet Take 1,000 mg by mouth daily.      Marland Kitchen aspirin 81 MG EC tablet Take 81 mg by mouth daily.      . Calcium Carbonate-Vitamin D (CALCIUM-VITAMIN D) 500-200 MG-UNIT per tablet Take 1 tablet by mouth daily. Take 400 units daily     . cholecalciferol (VITAMIN D) 1000 UNITS tablet Take 1,000 Units by mouth daily.    . cyclobenzaprine (FLEXERIL) 10 MG tablet Take 1 tablet (10 mg total) by mouth 3 (three) times daily as needed for muscle spasms. 30 tablet 1  . esomeprazole (NEXIUM) 40 MG capsule Take 1 capsule (40 mg total) by mouth 2 (two) times daily. 180 capsule 3  . guaiFENesin (MUCINEX) 600 MG 12 hr tablet Take 1,200 mg by mouth as needed.      . hydrochlorothiazide (HYDRODIURIL) 25 MG tablet Take 1 tablet (25 mg total) by mouth daily. 90 tablet 3  . HYDROcodone-acetaminophen (NORCO)  5-325 MG per tablet Take 1 tablet by mouth every 6 (six) hours as needed for moderate pain. 30 tablet 0  . meclizine (ANTIVERT) 12.5 MG tablet Take 1 tablet (12.5 mg total) by mouth 3 (three) times daily as needed for dizziness (watch out for sedation). 30 tablet 1  . meloxicam (MOBIC) 7.5 MG tablet TAKE 1 TABLET BY MOUTH DAILY WITH FOOD. 90 tablet 1  . mometasone (NASONEX) 50 MCG/ACT nasal spray Place 2 sprays into the nose daily. 51 g 3  . Multiple Vitamin (MULTIVITAMIN) tablet Take 1 tablet by mouth daily.      . potassium chloride (K-DUR) 10 MEQ tablet Take 2 tablets (20 mEq total) by mouth 2 (two) times daily. 360 tablet 3  . venlafaxine XR (EFFEXOR-XR) 75 MG 24 hr capsule TAKE 1 CAPSULE BY MOUTH ONCE DAILY. 90 capsule 3  . colchicine 0.6 MG tablet Take 0.6 mg by mouth as needed.    Marland Kitchen scopolamine (TRANSDERM-SCOP) 1.5 MG Place 1 patch (1.5 mg total) onto the skin every 3 (three) days. (Patient not taking: Reported on 09/13/2014) 10 patch 0   No current facility-administered medications on file prior to visit.      Review of Systems Review of Systems  Constitutional: Negative for fever, appetite change,  and unexpected weight change.  ENt pos for cong and rhinorrhea and sinus pain  Eyes: Negative for pain and visual disturbance.  Respiratory: Negative for wheeze  and shortness of breath.   Cardiovascular: Negative for cp or palpitations    Gastrointestinal: Negative for nausea, diarrhea and constipation.  Genitourinary: Negative for urgency and frequency.  Skin: Negative for pallor or rash   Neurological: Negative for weakness, light-headedness, numbness and headaches.  Hematological: Negative for adenopathy. Does not bruise/bleed easily.  Psychiatric/Behavioral: Negative for dysphoric mood. The patient is not nervous/anxious.         Objective:   Physical Exam  Constitutional: She appears well-developed and well-nourished. No distress.  obese and well appearing   HENT:  Head:  Normocephalic and atraumatic.  Mouth/Throat: Oropharynx is clear and moist.  Nares are injected and congested  Bilateral maxillary sinus tenderness   Eyes: Conjunctivae and EOM are normal. Pupils are equal, round, and reactive to light. No scleral icterus.  Neck: Normal range of motion. Neck supple.  Cardiovascular: Normal rate and regular rhythm.   Pulmonary/Chest: Effort normal and breath sounds normal. No respiratory distress. She has no wheezes. She has no rales.  Musculoskeletal:  She exhibits no edema.  Lymphadenopathy:    She has no cervical adenopathy.  Neurological: She is alert. She has normal reflexes. No cranial nerve deficit. She exhibits normal muscle tone. Coordination normal.  Skin: Skin is warm and dry. No rash noted. No erythema. No pallor.  Psychiatric: She has a normal mood and affect.          Assessment & Plan:   Problem List Items Addressed This Visit      Respiratory   Acute sinusitis - Primary    After uri  Cover with levaquin (pt dislikes augmentin) Fluids and rest  Disc symptomatic care - see instructions on AVS  Hycodan for cough with caution of sedation Update if not starting to improve in a week or if worsening        Relevant Medications   HYDROCODONE-HOMATROPINE 5-1.5 MG/5ML PO SYRP   levofloxacin (LEVAQUIN) tablet

## 2014-09-13 NOTE — Patient Instructions (Signed)
Take levaquin for sinus infection Drink lots of water  Use nasal saline spray  Try the hycodan for cough as needed -with caution - it will sedate

## 2014-09-13 NOTE — Progress Notes (Signed)
Pre visit review using our clinic review tool, if applicable. No additional management support is needed unless otherwise documented below in the visit note. 

## 2014-09-29 ENCOUNTER — Telehealth: Payer: Self-pay | Admitting: Family Medicine

## 2014-09-29 MED ORDER — LEVOFLOXACIN 500 MG PO TABS: 500.0000 mg | ORAL_TABLET | Freq: Every day | ORAL | Status: DC

## 2014-09-29 NOTE — Telephone Encounter (Signed)
Please repeat course of levaquin - call in please  F/u if no improvement with this

## 2014-09-29 NOTE — Telephone Encounter (Signed)
[facility] Patient Name: Veronica Greene DOB: [birth year] Initial Comment Caller states, dx with secondary sinus infection 3/7 , she was given 7 ds antibiotic, she still has symptoms Nurse Assessment Nurse: Mallie Mussel, RN, Alveta Heimlich Date/Time Veronica Greene Time): 09/29/2014 10:16:23 AM Confirm and document reason for call. If symptomatic, describe symptoms. ---Caller states that she was prescribed Levaquin for 7 days to treat a sinus infection. Call was lost at this time. I called back and received her voicemail. I left a message that I will call back. Has the patient traveled out of the country within the last 30 days? ---Not Applicable Does the patient require triage? ---No Nurse: [place], RN, Amy Date/Time (Eastern Time): 09/29/2014 10:41:57 AM Confirm and document reason for call. If symptomatic, describe symptoms. ---CALLER STATES SHE IS STILL CONTINUING TO HAVE SYMPTOMS AFTER SHE HAS FINISHED THE ANTIBIOTICS ON THE 13TH. SHE IS STILL HAVING TOOTHACHE, GREEN SECRETIONS FROM THE NOSE. SHE IS STILL COUGHING AS WELL. COUGH HAS GOT BETTER WITH THE ANTIBIOTICS, STILL A HACKY COUGH. SHE STATES THAT SHE SHOULD HAVE CALLED BACK EARLIER. NO FEVER. SHE WAS CHILLED AND COLD LAST NIGHT, DOES NOT THINK SHE HAD FEVER. SHE IS NOT SURE IF SHE NEEDS AN ADDITIONAL ANTIBIOTIC OR WHAT SHE NEEDS TO DO. NO SOB. NO CHEST DISCOMFORT OR PAIN. Has the patient traveled out of the country within the last 30 days? ---Not Applicable Does the patient require triage? ---Yes Related visit to physician within the last 2 weeks? ---Yes Does the PT have any chronic conditions? (i.e. diabetes, asthma, etc.) ---No Guidelines Guideline Title Affirmed Question Affirmed Notes Sinus Infection on Antibiotic Follow-up Call [1] Taking antibiotic > 7 days AND [2] nasal discharge not improved Final Disposition User See PCP When Office is Open  (within 3 days) [place], Therapist, sports, Amy Comments CALLER WAS INSTRUCTED THAT WILL SEND THIS INFORMATION OVER TO THE OFFICE AND WILL SEE IF THE MD WANTS TO CALL HER IN SOMETHING IN ADDITIONAL SINCE SHE HAS JUST BEEN ON ANTIBIOTICS. SHE STATES THAT SHE WISHES THAT SHE PLEASE NOTE: All timestamps contained within this report are represented as [place] Standard Time. CONFIDENTIALTY NOTICE: This fax transmission is intended only for the addressee. It contains information that is legally privileged, confidential or otherwise protected from use or disclosure. If you are not the intended recipient, you are strictly prohibited from reviewing, disclosing, copying using or disseminating any of this information or taking any action in reliance on or regarding this information. If you have received this fax in error, please notify us immediately by telephone so that we can arrange for its return to [place]. Phone: [phone], Toll-Free: [phone], Fax: [phone] Page: 2 of 2 Call Id: [id number] Comments HAD CALLED IN ON MONDAY, BUT SHE THOUGHT THAT THE ANTIBIOTICS WERE CONTINUING TO WORK AND SHE WOULD JUST WAIT AND SEE HOW SHE WAS DOING. INFORMED HER THAT THE INFORMATION WOULD BE SEEN BY THE MD AND THAT SHE SHOULD BE CALLED FROM THE OFFICE SOMETIME TODAY WITH DIRECTIONS FROM THE MD ON WHAT SHE WANTS WHETHER A VISIT OR A CONTINUATION OF AN ANTIBIOTIC, BUT MD WILL NEED TO ADDRESS THIS. SHE VERBALIZED UNDERSTANDING OF ALL INFORMATION GIVEN. WILL CLOSE THIS CALL.

## 2014-09-29 NOTE — Telephone Encounter (Signed)
Levaquin sent to pharmacy.  Patient aware.

## 2014-10-01 ENCOUNTER — Encounter: Payer: Medicare Other | Admitting: Family Medicine

## 2014-10-04 ENCOUNTER — Telehealth: Payer: Self-pay | Admitting: Family Medicine

## 2014-10-04 DIAGNOSIS — E876 Hypokalemia: Secondary | ICD-10-CM

## 2014-10-04 DIAGNOSIS — E785 Hyperlipidemia, unspecified: Secondary | ICD-10-CM

## 2014-10-04 DIAGNOSIS — M1A9XX0 Chronic gout, unspecified, without tophus (tophi): Secondary | ICD-10-CM

## 2014-10-04 DIAGNOSIS — R609 Edema, unspecified: Secondary | ICD-10-CM

## 2014-10-04 DIAGNOSIS — Z0000 Encounter for general adult medical examination without abnormal findings: Secondary | ICD-10-CM

## 2014-10-04 DIAGNOSIS — M8580 Other specified disorders of bone density and structure, unspecified site: Secondary | ICD-10-CM

## 2014-10-04 DIAGNOSIS — Z13220 Encounter for screening for lipoid disorders: Secondary | ICD-10-CM

## 2014-10-04 NOTE — Telephone Encounter (Signed)
-----   Message from Ellamae Sia sent at 10/04/2014  2:28 PM EDT ----- Regarding: Lab orders for Tuesday, 3.29.16 Patient is scheduled for CPX labs, please order future labs, Thanks , Karna Christmas

## 2014-10-05 ENCOUNTER — Encounter: Payer: Medicare Other | Admitting: Family Medicine

## 2014-10-05 ENCOUNTER — Other Ambulatory Visit (INDEPENDENT_AMBULATORY_CARE_PROVIDER_SITE_OTHER): Payer: PPO

## 2014-10-05 DIAGNOSIS — M8580 Other specified disorders of bone density and structure, unspecified site: Secondary | ICD-10-CM | POA: Diagnosis not present

## 2014-10-05 DIAGNOSIS — M1A9XX0 Chronic gout, unspecified, without tophus (tophi): Secondary | ICD-10-CM

## 2014-10-05 DIAGNOSIS — E876 Hypokalemia: Secondary | ICD-10-CM | POA: Diagnosis not present

## 2014-10-05 DIAGNOSIS — E785 Hyperlipidemia, unspecified: Secondary | ICD-10-CM

## 2014-10-05 DIAGNOSIS — R609 Edema, unspecified: Secondary | ICD-10-CM

## 2014-10-05 LAB — LIPID PANEL
Cholesterol: 165 mg/dL (ref 0–200)
HDL: 47.7 mg/dL
LDL Cholesterol: 90 mg/dL (ref 0–99)
NonHDL: 117.3
Total CHOL/HDL Ratio: 3
Triglycerides: 135 mg/dL (ref 0.0–149.0)
VLDL: 27 mg/dL (ref 0.0–40.0)

## 2014-10-05 LAB — CBC WITH DIFFERENTIAL/PLATELET
Basophils Absolute: 0 10*3/uL (ref 0.0–0.1)
Basophils Relative: 0.3 % (ref 0.0–3.0)
EOS PCT: 6.4 % — AB (ref 0.0–5.0)
Eosinophils Absolute: 0.3 10*3/uL (ref 0.0–0.7)
HCT: 37.1 % (ref 36.0–46.0)
Hemoglobin: 12.9 g/dL (ref 12.0–15.0)
LYMPHS PCT: 25.1 % (ref 12.0–46.0)
Lymphs Abs: 1.1 10*3/uL (ref 0.7–4.0)
MCHC: 34.8 g/dL (ref 30.0–36.0)
MCV: 86.7 fl (ref 78.0–100.0)
Monocytes Absolute: 0.2 10*3/uL (ref 0.1–1.0)
Monocytes Relative: 5.1 % (ref 3.0–12.0)
Neutro Abs: 2.8 10*3/uL (ref 1.4–7.7)
Neutrophils Relative %: 63.1 % (ref 43.0–77.0)
PLATELETS: 208 10*3/uL (ref 150.0–400.0)
RBC: 4.28 Mil/uL (ref 3.87–5.11)
RDW: 13.5 % (ref 11.5–15.5)
WBC: 4.4 10*3/uL (ref 4.0–10.5)

## 2014-10-05 LAB — COMPREHENSIVE METABOLIC PANEL
ALT: 14 U/L (ref 0–35)
AST: 18 U/L (ref 0–37)
Albumin: 4.2 g/dL (ref 3.5–5.2)
Alkaline Phosphatase: 56 U/L (ref 39–117)
BILIRUBIN TOTAL: 0.6 mg/dL (ref 0.2–1.2)
BUN: 15 mg/dL (ref 6–23)
CALCIUM: 9.4 mg/dL (ref 8.4–10.5)
CO2: 29 mEq/L (ref 19–32)
Chloride: 103 mEq/L (ref 96–112)
Creatinine, Ser: 0.62 mg/dL (ref 0.40–1.20)
GFR: 101.26 mL/min (ref >60.00)
GLUCOSE: 104 mg/dL — AB (ref 70–99)
POTASSIUM: 4.1 meq/L (ref 3.5–5.1)
Sodium: 138 mEq/L (ref 135–145)
Total Protein: 6.8 g/dL (ref 6.0–8.3)

## 2014-10-05 LAB — TSH: TSH: 1.91 u[IU]/mL (ref 0.35–4.50)

## 2014-10-05 LAB — VITAMIN D 25 HYDROXY (VIT D DEFICIENCY, FRACTURES): VITD: 45.48 ng/mL (ref 30.00–100.00)

## 2014-10-06 ENCOUNTER — Encounter: Payer: Self-pay | Admitting: Internal Medicine

## 2014-10-08 ENCOUNTER — Encounter: Payer: Medicare Other | Admitting: Family Medicine

## 2014-10-12 ENCOUNTER — Encounter: Payer: Self-pay | Admitting: Family Medicine

## 2014-10-12 ENCOUNTER — Ambulatory Visit (INDEPENDENT_AMBULATORY_CARE_PROVIDER_SITE_OTHER): Payer: PPO | Admitting: Family Medicine

## 2014-10-12 VITALS — BP 136/72 | HR 76 | Temp 97.7°F | Ht 66.0 in | Wt 192.0 lb

## 2014-10-12 DIAGNOSIS — E785 Hyperlipidemia, unspecified: Secondary | ICD-10-CM | POA: Diagnosis not present

## 2014-10-12 DIAGNOSIS — E669 Obesity, unspecified: Secondary | ICD-10-CM | POA: Diagnosis not present

## 2014-10-12 DIAGNOSIS — M8580 Other specified disorders of bone density and structure, unspecified site: Secondary | ICD-10-CM

## 2014-10-12 DIAGNOSIS — Z23 Encounter for immunization: Secondary | ICD-10-CM

## 2014-10-12 DIAGNOSIS — Z0000 Encounter for general adult medical examination without abnormal findings: Secondary | ICD-10-CM | POA: Diagnosis not present

## 2014-10-12 MED ORDER — MELOXICAM 7.5 MG PO TABS: ORAL_TABLET | ORAL | Status: DC

## 2014-10-12 MED ORDER — ESOMEPRAZOLE MAGNESIUM 40 MG PO CPDR: 40.0000 mg | DELAYED_RELEASE_CAPSULE | Freq: Two times a day (BID) | ORAL | Status: DC

## 2014-10-12 MED ORDER — VENLAFAXINE HCL ER 75 MG PO CP24: ORAL_CAPSULE | ORAL | Status: DC

## 2014-10-12 MED ORDER — LUBIPROSTONE 24 MCG PO CAPS: ORAL_CAPSULE | ORAL | Status: DC

## 2014-10-12 MED ORDER — POTASSIUM CHLORIDE ER 10 MEQ PO TBCR: 20.0000 meq | EXTENDED_RELEASE_TABLET | Freq: Two times a day (BID) | ORAL | Status: DC

## 2014-10-12 MED ORDER — CYCLOBENZAPRINE HCL 10 MG PO TABS: 10.0000 mg | ORAL_TABLET | Freq: Three times a day (TID) | ORAL | Status: DC | PRN

## 2014-10-12 MED ORDER — HYDROCHLOROTHIAZIDE 25 MG PO TABS: 25.0000 mg | ORAL_TABLET | Freq: Every day | ORAL | Status: DC

## 2014-10-12 MED ORDER — MOMETASONE FUROATE 50 MCG/ACT NA SUSP: 2.0000 | Freq: Every day | NASAL | Status: DC

## 2014-10-12 NOTE — Progress Notes (Signed)
Pre visit review using our clinic review tool, if applicable. No additional management support is needed unless otherwise documented below in the visit note. 

## 2014-10-12 NOTE — Progress Notes (Signed)
Subjective:    Patient ID: Veronica Greene, female    DOB: [birth year], 69 y.o.   MRN: [id number]  HPI Here for annual medicare wellness visit as well as chronic/acute medical problems   I have personally reviewed the Medicare Annual Wellness questionnaire and have noted 1. The patient's medical and social history 2. Their use of alcohol, tobacco or illicit drugs 3. Their current medications and supplements 4. The patient's functional ability including ADL's, fall risks, home safety risks and hearing or visual             impairment. 5. Diet and physical activities 6. Evidence for depression or mood disorders  The patients weight, height, BMI have been recorded in the chart and visual acuity is per eye clinic.  I have made referrals, counseling and provided education to the patient based review of the above and I have provided the pt with a written personalized care plan for preventive services.  Doing ok overall  Arthritis is bothering her more  Fingers - esp middle R hand  Also trigger finger - occas  Still crochets however  Ankles and knees also  Was going to get knees inj- putting it off (Dr Percell Miller) -- has had lubricant injections - tends to help (until she fell)  dexa 10/13 -in the normal range , no fractures , takes ca and D  Does not use her cane all the time - may need to  At risk for falling due to polio (shoe is built up)  She is very careful    See scanned forms.  Routine anticipatory guidance given to patient.  See health maintenance. Colon cancer screening 5/13 (father had colon cancer) - 3 year recall  Breast cancer screening 10/15 nl  Self breast exam- no lumps  Flu vaccine 11/15 Tetanus vaccine 4/05  Pneumovax 1/12 , due for prevnar  Zoster vaccine 2/10   Advance directive- has living will and POA  Cognitive function addressed- see scanned forms- and if abnormal then additional documentation follows.  No concerning problems/ occ names slip her mind   PMH  and SH reviewed  Meds, vitals, and allergies reviewed.   ROS: See HPI.  Otherwise negative.    Results for orders placed or performed in visit on 10/05/14  CBC with Differential/Platelet  Result Value Ref Range   WBC 4.4 4.0 - 10.5 K/uL   RBC 4.28 3.87 - 5.11 Mil/uL   Hemoglobin 12.9 12.0 - 15.0 g/dL   HCT 37.1 36.0 - 46.0 %   MCV 86.7 78.0 - 100.0 fl   MCHC 34.8 30.0 - 36.0 g/dL   RDW 13.5 11.5 - 15.5 %   Platelets 208.0 150.0 - 400.0 K/uL   Neutrophils Relative % 63.1 43.0 - 77.0 %   Lymphocytes Relative 25.1 12.0 - 46.0 %   Monocytes Relative 5.1 3.0 - 12.0 %   Eosinophils Relative 6.4 (H) 0.0 - 5.0 %   Basophils Relative 0.3 0.0 - 3.0 %   Neutro Abs 2.8 1.4 - 7.7 K/uL   Lymphs Abs 1.1 0.7 - 4.0 K/uL   Monocytes Absolute 0.2 0.1 - 1.0 K/uL   Eosinophils Absolute 0.3 0.0 - 0.7 K/uL   Basophils Absolute 0.0 0.0 - 0.1 K/uL  Comprehensive metabolic panel  Result Value Ref Range   Sodium 138 135 - 145 mEq/L   Potassium 4.1 3.5 - 5.1 mEq/L   Chloride 103 96 - 112 mEq/L   CO2 29 19 - 32 mEq/L   Glucose, Bld  104 (H) 70 - 99 mg/dL   BUN 15 6 - 23 mg/dL   Creatinine, Ser 0.62 0.40 - 1.20 mg/dL   Total Bilirubin 0.6 0.2 - 1.2 mg/dL   Alkaline Phosphatase 56 39 - 117 U/L   AST 18 0 - 37 U/L   ALT 14 0 - 35 U/L   Total Protein 6.8 6.0 - 8.3 g/dL   Albumin 4.2 3.5 - 5.2 g/dL   Calcium 9.4 8.4 - 10.5 mg/dL   GFR 101.26 >60.00 mL/min  Lipid panel  Result Value Ref Range   Cholesterol 165 0 - 200 mg/dL   Triglycerides 135.0 0.0 - 149.0 mg/dL   HDL 47.70 >39.00 mg/dL   VLDL 27.0 0.0 - 40.0 mg/dL   LDL Cholesterol 90 0 - 99 mg/dL   Total CHOL/HDL Ratio 3    NonHDL 117.30   TSH  Result Value Ref Range   TSH 1.91 0.35 - 4.50 uIU/mL  Vit D  25 hydroxy (rtn osteoporosis monitoring)  Result Value Ref Range   VITD 45.48 30.00 - 100.00 ng/mL       Patient Active Problem List   Diagnosis Date Noted  . BPV (benign positional vertigo) 06/18/2014  . Sinus congestion 06/18/2014    . Encounter for Medicare annual wellness exam 09/30/2013  . Cough 08/24/2013  . Mid back pain on left side 06/01/2013  . Back pain 02/16/2013  . Contact dermatitis 10/30/2012  . Hyperlipidemia, mild 09/02/2012  . Other screening mammogram 12/12/2011  . Screening for lipoid disorders 08/31/2011  . Degenerative disk disease 08/31/2011  . Hypokalemia 01/02/2011  . Personal history of colonic polyps 11/03/2010  . Constipation, slow transit 11/03/2010  . Benign neoplasm of colon 11/03/2010  . Melanosis coli 11/03/2010  . Gout 08/25/2010  . DERMATITIS, SEBORRHEIC 08/25/2010  . Osteopenia 09/05/2007  . History of poliomyelitis 08/26/2007  . Obesity 08/26/2007  . ANXIETY 08/26/2007  . DEPRESSION 08/26/2007  . ALLERGIC RHINITIS 08/26/2007  . GERD 08/26/2007  . IBS 08/26/2007  . OVERACTIVE BLADDER 08/26/2007  . OSTEOARTHRITIS, HANDS, BILATERAL 08/26/2007  . FIBROMYALGIA 08/26/2007  . DIZZINESS OR VERTIGO 08/26/2007  . EDEMA, CHRONIC 08/26/2007  . URINARY INCONTINENCE 08/26/2007  . HEMATURIA, HX OF 08/26/2007   Past Medical History  Diagnosis Date  . Allergy     allergic rhinitis  . GERD (gastroesophageal reflux disease)   . Anxiety   . Hypertension   . Depression   . Dizziness   . Urinary incontinence   . Osteopenia   . Cataracts, bilateral     early cataracts  . Gout   . Sinusitis, chronic   . IBS (irritable bowel syndrome)     with constipation  . Arthritis    Past Surgical History  Procedure Laterality Date  . Hand surgery      Bil  . Carpal tunnel release      Bil  . Thumb arthroscopy      Bil/  . Tonsillectomy      69 years old  . Colonoscopy  11/09/2011  . Polio surg    . Ankle surgery      right  . Colonoscopy w/ polypectomy  10/21/2009  . Nasal sinus surgery  1976  . Knee surgery  1998    x2   History  Substance Use Topics  . Smoking status: Never Smoker   . Smokeless tobacco: Never Used  . Alcohol Use: No   Family History  Problem Relation  Age of Onset  . Kidney disease  Mother   . Stroke Father   . Colon cancer Father   . Kidney disease Sister   . Cancer Paternal Aunt     COLON CA   Allergies  Allergen Reactions  . Clarithromycin     REACTION: throat swells  . Codeine     REACTION: nausea and vomiting  . Paroxetine     REACTION: doesn't work  . Penicillins     REACTION: whelps   Current Outpatient Prescriptions on File Prior to Visit  Medication Sig Dispense Refill  . Ascorbic Acid (VITAMIN C) 1000 MG tablet Take 1,000 mg by mouth daily.      Marland Kitchen aspirin 81 MG EC tablet Take 81 mg by mouth daily.      . Calcium Carbonate-Vitamin D (CALCIUM-VITAMIN D) 500-200 MG-UNIT per tablet Take 1 tablet by mouth daily. Take 400 units daily     . cholecalciferol (VITAMIN D) 1000 UNITS tablet Take 1,000 Units by mouth daily.    . colchicine 0.6 MG tablet Take 0.6 mg by mouth as needed.    Marland Kitchen guaiFENesin (MUCINEX) 600 MG 12 hr tablet Take 1,200 mg by mouth as needed.      Marland Kitchen HYDROcodone-acetaminophen (NORCO) 5-325 MG per tablet Take 1 tablet by mouth every 6 (six) hours as needed for moderate pain. 30 tablet 0  . HYDROcodone-homatropine (HYCODAN) 5-1.5 MG/5ML syrup Take 5 mLs by mouth every 8 (eight) hours as needed for cough. 120 mL 0  . meclizine (ANTIVERT) 12.5 MG tablet Take 1 tablet (12.5 mg total) by mouth 3 (three) times daily as needed for dizziness (watch out for sedation). 30 tablet 1  . Multiple Vitamin (MULTIVITAMIN) tablet Take 1 tablet by mouth daily.      Marland Kitchen scopolamine (TRANSDERM-SCOP) 1.5 MG Place 1 patch (1.5 mg total) onto the skin every 3 (three) days. 10 patch 0   No current facility-administered medications on file prior to visit.    Review of Systems Review of Systems  Constitutional: Negative for fever, appetite change, fatigue and unexpected weight change.  Eyes: Negative for pain and visual disturbance.  Respiratory: Negative for cough and shortness of breath.   Cardiovascular: Negative for cp or  palpitations    Gastrointestinal: Negative for nausea, diarrhea and constipation.  Genitourinary: Negative for urgency and frequency.  Skin: Negative for pallor or rash   MSK pos for joint pain from arthritis , neg for joint swelling  Neurological: Negative for weakness, light-headedness, numbness and headaches. pos for post polio syndrome and occ falls  Hematological: Negative for adenopathy. Does not bruise/bleed easily.  Psychiatric/Behavioral: Negative for dysphoric mood. The patient is not nervous/anxious.         Objective:   Physical Exam  Constitutional: She appears well-developed and well-nourished. No distress.  obese and well appearing   HENT:  Head: Normocephalic and atraumatic.  Right Ear: External ear normal.  Left Ear: External ear normal.  Nose: Nose normal.  Mouth/Throat: Oropharynx is clear and moist.  Eyes: Conjunctivae and EOM are normal. Pupils are equal, round, and reactive to light. Right eye exhibits no discharge. Left eye exhibits no discharge. No scleral icterus.  Neck: Normal range of motion. Neck supple. No JVD present. Carotid bruit is not present. No thyromegaly present.  Cardiovascular: Normal rate, regular rhythm, normal heart sounds and intact distal pulses.  Exam reveals no gallop.   Pulmonary/Chest: Effort normal and breath sounds normal. No respiratory distress. She has no wheezes. She has no rales.  Abdominal: Soft. Bowel sounds are  normal. She exhibits no distension and no mass. There is no tenderness.  Genitourinary:  Breast exam: No mass, nodules, thickening, tenderness, bulging, retraction, inflamation, nipple discharge or skin changes noted.  No axillary or clavicular LA.      Musculoskeletal: She exhibits no edema or tenderness.  R leg shorter- built up shoe  Gait is steady -does not have cane with her today  Lymphadenopathy:    She has no cervical adenopathy.  Neurological: She is alert. She has normal reflexes. No cranial nerve deficit.  She exhibits normal muscle tone. Coordination normal.  Skin: Skin is warm and dry. No rash noted. No erythema. No pallor.  Psychiatric: She has a normal mood and affect.          Assessment & Plan:   Problem List Items Addressed This Visit      Musculoskeletal and Integument   Osteopenia    Reassuring dexa 2013 No fractures  On ca and D  Disc need for calcium/ vitamin D/ wt bearing exercise and bone density test every 2-5 y to monitor  Disc safety/ fracture risk in detail          Other   Encounter for Medicare annual wellness exam    Reviewed health habits including diet and exercise and skin cancer prevention labs look good  You are due for a tetanus shot-get that at the health dept> prevnar vaccine today  Call and schedule your colonoscopy  Take care of yourself  Stay active Use your cane to prevent falls and avoid rugs in the house Keep exercising e screening tests for age  Also reviewed health mt list, fam hx and immunization status , as well as social and family history         Hyperlipidemia, mild    Disc goals for lipids and reasons to control them Rev labs with pt Rev low sat fat diet in detail       Relevant Medications   hydrochlorothiazide tablet   Obesity    Discussed how this problem influences overall health and the risks it imposes  Reviewed plan for weight loss with lower calorie diet (via better food choices and also portion control or program like weight watchers) and exercise building up to or more than 30 minutes 5 days per week including some aerobic activity          Other Visit Diagnoses    Need for prophylactic vaccination against Streptococcus pneumoniae (pneumococcus)    -  Primary    Relevant Orders    Pneumococcal conjugate vaccine 13-valent IM (Completed)

## 2014-10-12 NOTE — Patient Instructions (Signed)
Labs look good  You are due for a tetanus shot-get that at the health dept> prevnar vaccine today  Call and schedule your colonoscopy  Take care of yourself  Stay active Use your cane to prevent falls and avoid rugs in the house Keep exercising

## 2014-10-13 ENCOUNTER — Telehealth: Payer: Self-pay

## 2014-10-13 NOTE — Telephone Encounter (Signed)
Prior auth request for esomeprazole.  Done via cover my meds.

## 2014-10-13 NOTE — Assessment & Plan Note (Signed)
Reviewed health habits including diet and exercise and skin cancer prevention labs look good  You are due for a tetanus shot-get that at the health dept> prevnar vaccine today  Call and schedule your colonoscopy  Take care of yourself  Stay active Use your cane to prevent falls and avoid rugs in the house Keep exercising e screening tests for age  Also reviewed health mt list, fam hx and immunization status , as well as social and family history

## 2014-10-13 NOTE — Assessment & Plan Note (Signed)
Disc goals for lipids and reasons to control them Rev labs with pt Rev low sat fat diet in detail   

## 2014-10-13 NOTE — Assessment & Plan Note (Signed)
Reassuring dexa 2013 No fractures  On ca and D  Disc need for calcium/ vitamin D/ wt bearing exercise and bone density test every 2-5 y to monitor  Disc safety/ fracture risk in detail

## 2014-10-13 NOTE — Assessment & Plan Note (Signed)
Discussed how this problem influences overall health and the risks it imposes  Reviewed plan for weight loss with lower calorie diet (via better food choices and also portion control or program like weight watchers) and exercise building up to or more than 30 minutes 5 days per week including some aerobic activity    

## 2014-10-14 ENCOUNTER — Telehealth: Payer: Self-pay

## 2014-10-14 NOTE — Telephone Encounter (Signed)
Good question- those are the type of white blood cells involved in allergic reactions - and are commonly elevated in healthy people- especially during allergy season

## 2014-10-14 NOTE — Telephone Encounter (Signed)
Pt left v/m requesting cb; pt wants to know purpose of eosinophils; pt saw that eos was 6.4. Pt has CPX on 10/18/14. Pt request cb.

## 2014-10-15 NOTE — Telephone Encounter (Signed)
Patient aware of lab results.

## 2014-10-19 NOTE — Telephone Encounter (Signed)
Veronica Greene at [facility] that prior authorization for esomeprazole has been sent through cover my meds twice, waiting response.

## 2014-10-19 NOTE — Telephone Encounter (Signed)
Colletta Maryland with [facility] left v/m requesting cb about PA for Nexium; [facility] received fax from cover my med PA had been done, but med will not go thru. [facility] request f/u on PA.

## 2014-10-20 ENCOUNTER — Encounter: Payer: Self-pay | Admitting: Gastroenterology

## 2014-10-22 ENCOUNTER — Telehealth: Payer: Self-pay

## 2014-10-22 NOTE — Telephone Encounter (Signed)
Insurance has approved esomeprozole rx.

## 2014-10-29 ENCOUNTER — Encounter: Payer: Self-pay | Admitting: Primary Care

## 2014-10-29 ENCOUNTER — Ambulatory Visit (INDEPENDENT_AMBULATORY_CARE_PROVIDER_SITE_OTHER): Payer: PPO | Admitting: Primary Care

## 2014-10-29 VITALS — BP 144/60 | HR 72 | Temp 97.5°F | Ht 65.75 in | Wt 190.8 lb

## 2014-10-29 DIAGNOSIS — M62838 Other muscle spasm: Secondary | ICD-10-CM

## 2014-10-29 DIAGNOSIS — M6248 Contracture of muscle, other site: Secondary | ICD-10-CM

## 2014-10-29 MED ORDER — PREDNISONE 20 MG PO TABS: ORAL_TABLET | ORAL | Status: DC

## 2014-10-29 MED ORDER — TIZANIDINE HCL 2 MG PO TABS: 2.0000 mg | ORAL_TABLET | Freq: Three times a day (TID) | ORAL | Status: DC | PRN

## 2014-10-29 MED ORDER — IBUPROFEN 600 MG PO TABS: 600.0000 mg | ORAL_TABLET | Freq: Three times a day (TID) | ORAL | Status: DC | PRN

## 2014-10-29 NOTE — Progress Notes (Signed)
Subjective:    Patient ID: Veronica Greene, female    DOB: 1946-04-02, 69 y.o.   MRN: [id number]  HPI  [name] is a 69 year old female who presents today with a chief complaint of neck pain that has been present for the past 3 days. The pain is present bilaterally, but began and is worse on the right side. She first noticed the pain Wednesday morning. She does remember laying on her fiances shoulder Tuesday night while sleeping. She presented to the chiropractor for her routine check up, and they did some biofreeze and strethching. She felt better after her appointment, but her pain quickly returned. She's taken several doses of ibuprofen 400mg  at lunch and dinner, and took 5mg  of flexeril at bedtime which helped her sleep. She had slight improvement, but today reports the pain has returned. She took 400mg  of motrin and 5mg  of flexeril this morning, but doesn't feel as though the flexeril is helping much. She's tried applying heat and ice with some improvement with heat. She denies numbness/tingling, recent injury or trauma.   Review of Systems  Constitutional: Negative for fever and chills.  Eyes: Negative for visual disturbance.  Respiratory: Negative for shortness of breath.   Cardiovascular: Negative for chest pain.  Gastrointestinal: Negative for nausea and vomiting.  Musculoskeletal: Positive for neck pain.       History of arthritis.  Neurological: Negative for weakness, numbness and headaches.       Past Medical History  Diagnosis Date  . Allergy     allergic rhinitis  . GERD (gastroesophageal reflux disease)   . Anxiety   . Hypertension   . Depression   . Dizziness   . Urinary incontinence   . Osteopenia   . Cataracts, bilateral     early cataracts  . Gout   . Sinusitis, chronic   . IBS (irritable bowel syndrome)     with constipation  . Arthritis     History   Social History  . Marital Status: Widowed    Spouse Name: N/A  . Number of Children: 1  . Years of  Education: N/A   Occupational History  . RECEPT[employer]    Social History Main Topics  . Smoking status: Never Smoker   . Smokeless tobacco: Never Used  . Alcohol Use: No  . Drug Use: No  . Sexual Activity: Not on file   Other Topics Concern  . Not on file   Social History Narrative    Past Surgical History  Procedure Laterality Date  . Hand surgery      Bil  . Carpal tunnel release      Bil  . Thumb arthroscopy      Bil/  . Tonsillectomy      69 years old  . Colonoscopy  11/09/2011  . Polio surg    . Ankle surgery      right  . Colonoscopy w/ polypectomy  10/21/2009  . Nasal sinus surgery  1976  . Knee surgery  1998    x2    Family History  Problem Relation Age of Onset  . Kidney disease Mother   . Stroke Father   . Colon cancer Father   . Kidney disease Sister   . Cancer Paternal Aunt     COLON CA    Allergies  Allergen Reactions  . Clarithromycin     REACTION: throat swells  . Codeine     REACTION: nausea and vomiting  . Paroxetine  REACTION: doesn't work  . Penicillins     REACTION: whelps    Current Outpatient Prescriptions on File Prior to Visit  Medication Sig Dispense Refill  . Ascorbic Acid (VITAMIN C) 1000 MG tablet Take 1,000 mg by mouth daily.      Marland Kitchen aspirin 81 MG EC tablet Take 81 mg by mouth daily.      . Calcium Carbonate-Vitamin D (CALCIUM-VITAMIN D) 500-200 MG-UNIT per tablet Take 1 tablet by mouth daily. Take 400 units daily     . cholecalciferol (VITAMIN D) 1000 UNITS tablet Take 1,000 Units by mouth daily.    . cyclobenzaprine (FLEXERIL) 10 MG tablet Take 1 tablet (10 mg total) by mouth 3 (three) times daily as needed for muscle spasms. 30 tablet 5  . esomeprazole (NEXIUM) 40 MG capsule Take 1 capsule (40 mg total) by mouth 2 (two) times daily. 60 capsule 11  . guaiFENesin (MUCINEX) 600 MG 12 hr tablet Take 1,200 mg by mouth as needed.      . hydrochlorothiazide (HYDRODIURIL) 25 MG tablet Take 1 tablet (25 mg total) by mouth daily.  90 tablet 3  . HYDROcodone-acetaminophen (NORCO) 5-325 MG per tablet Take 1 tablet by mouth every 6 (six) hours as needed for moderate pain. 30 tablet 0  . HYDROcodone-homatropine (HYCODAN) 5-1.5 MG/5ML syrup Take 5 mLs by mouth every 8 (eight) hours as needed for cough. 120 mL 0  . lubiprostone (AMITIZA) 24 MCG capsule TAKE 1 CAPSULE BY MOUTH TWICE A DAY. 60 capsule 11  . meclizine (ANTIVERT) 12.5 MG tablet Take 1 tablet (12.5 mg total) by mouth 3 (three) times daily as needed for dizziness (watch out for sedation). 30 tablet 1  . meloxicam (MOBIC) 7.5 MG tablet TAKE 1 TABLET BY MOUTH DAILY WITH FOOD. 30 tablet 11  . mometasone (NASONEX) 50 MCG/ACT nasal spray Place 2 sprays into the nose daily. 17 g 11  . Multiple Vitamin (MULTIVITAMIN) tablet Take 1 tablet by mouth daily.      . potassium chloride (K-DUR) 10 MEQ tablet Take 2 tablets (20 mEq total) by mouth 2 (two) times daily. 360 tablet 3  . scopolamine (TRANSDERM-SCOP) 1.5 MG Place 1 patch (1.5 mg total) onto the skin every 3 (three) days. 10 patch 0  . venlafaxine XR (EFFEXOR-XR) 75 MG 24 hr capsule TAKE 1 CAPSULE BY MOUTH ONCE DAILY. 90 capsule 3  . colchicine 0.6 MG tablet Take 0.6 mg by mouth as needed.     No current facility-administered medications on file prior to visit.    BP 144/60 mmHg  Pulse 72  Temp(Src) 97.5 F (36.4 C) (Oral)  Ht 5' 5.75" (1.67 m)  Wt 190 lb 12.8 oz (86.546 kg)  BMI 31.03 kg/m2  SpO2 95%    Objective:   Physical Exam  Constitutional: She is oriented to person, place, and time. She appears well-developed.  Neck: Muscular tenderness present. No spinous process tenderness present. Rigidity present.  Decreased ROM. Pain and decreased ROM is worse on the right side. Most pain upon extension.  Cardiovascular: Normal rate and regular rhythm.   Murmur heard. Pulmonary/Chest: Effort normal and breath sounds normal.  Lymphadenopathy:    She has no cervical adenopathy.  Neurological: She is alert and  oriented to person, place, and time. No cranial nerve deficit.  Skin: Skin is warm and dry.  Psychiatric: She has a normal mood and affect.          Assessment & Plan:  Neck pain:  Muscle tightness present to  neck bilaterally. Decreased ROM, especially upon lateral movement and bending. Pain with AROM and PROM, especially to extension. She's had no improvement with ibuprofen 400mg  and flexeril RX for short burst of prednisone x 5 days, Tizanidine PRN, ibuprofen PRN. Neck exercises, apply heat. Follow up if no improvement on Monday.

## 2014-10-29 NOTE — Progress Notes (Signed)
Pre visit review using our clinic review tool, if applicable. No additional management support is needed unless otherwise documented below in the visit note. 

## 2014-10-29 NOTE — Patient Instructions (Addendum)
Start Prednisone today. Take 2 tablets by mouth daily for 5 days. You may take the Tizanidine three times daily as needed for muscle spasms. This may make you drowsy, please be cautious.  Start taking the ibuprofen three times daily as needed for pain and inflammation. Call me if no improvement by Monday next week. Take care!  Muscle Cramps and Spasms Muscle cramps and spasms occur when a muscle or muscles tighten and you have no control over this tightening (involuntary muscle contraction). They are a common problem and can develop in any muscle. The most common place is in the calf muscles of the leg. Both muscle cramps and muscle spasms are involuntary muscle contractions, but they also have differences:   Muscle cramps are sporadic and painful. They may last a few seconds to a quarter of an hour. Muscle cramps are often more forceful and last longer than muscle spasms.  Muscle spasms may or may not be painful. They may also last just a few seconds or much longer. CAUSES  It is uncommon for cramps or spasms to be due to a serious underlying problem. In many cases, the cause of cramps or spasms is unknown. Some common causes are:   Overexertion.   Overuse from repetitive motions (doing the same thing over and over).   Remaining in a certain position for a long period of time.   Improper preparation, form, or technique while performing a sport or activity.   Dehydration.   Injury.   Side effects of some medicines.   Abnormally low levels of the salts and ions in your blood (electrolytes), especially potassium and calcium. This could happen if you are taking water pills (diuretics) or you are pregnant.  Some underlying medical problems can make it more likely to develop cramps or spasms. These include, but are not limited to:   Diabetes.   Parkinson disease.   Hormone disorders, such as thyroid problems.   Alcohol abuse.   Diseases specific to muscles, joints, and  bones.   Blood vessel disease where not enough blood is getting to the muscles.  HOME CARE INSTRUCTIONS   Stay well hydrated. Drink enough water and fluids to keep your urine clear or pale yellow.  It may be helpful to massage, stretch, and relax the affected muscle.  For tight or tense muscles, use a warm towel, heating pad, or hot shower water directed to the affected area.  If you are sore or have pain after a cramp or spasm, applying ice to the affected area may relieve discomfort.  Put ice in a plastic bag.  Place a towel between your skin and the bag.  Leave the ice on for 15-20 minutes, 03-04 times a day.  Medicines used to treat a known cause of cramps or spasms may help reduce their frequency or severity. Only take over-the-counter or prescription medicines as directed by your caregiver. SEEK MEDICAL CARE IF:  Your cramps or spasms get more severe, more frequent, or do not improve over time.  MAKE SURE YOU:   Understand these instructions.  Will watch your condition.  Will get help right away if you are not doing well or get worse. Document Released: 12/15/2001 Document Revised: 10/20/2012 Document Reviewed: 06/11/2012 [facility] Patient Information 2015 [place], [place]. This information is not intended to replace advice given to you by your health care provider. Make sure you discuss any questions you have with your health care provider.

## 2014-11-25 ENCOUNTER — Ambulatory Visit (AMBULATORY_SURGERY_CENTER): Payer: PPO | Admitting: *Deleted

## 2014-11-25 VITALS — Ht 65.0 in | Wt 192.6 lb

## 2014-11-25 DIAGNOSIS — Z86010 Personal history of colonic polyps: Secondary | ICD-10-CM

## 2014-11-25 MED ORDER — MOVIPREP 100 G PO SOLR: ORAL | Status: DC

## 2014-11-25 NOTE — Progress Notes (Signed)
No allergies to eggs or soy. No problems with anesthesia.  Pt given Emmi instructions for colonoscopy  No oxygen use  No diet drug use  

## 2014-12-09 ENCOUNTER — Encounter: Payer: Self-pay | Admitting: Gastroenterology

## 2014-12-09 ENCOUNTER — Ambulatory Visit (AMBULATORY_SURGERY_CENTER): Payer: PPO | Admitting: Gastroenterology

## 2014-12-09 VITALS — BP 132/82 | HR 63 | Temp 97.2°F | Resp 16 | Ht 65.0 in | Wt 192.0 lb

## 2014-12-09 DIAGNOSIS — Z86010 Personal history of colonic polyps: Secondary | ICD-10-CM | POA: Diagnosis not present

## 2014-12-09 DIAGNOSIS — D122 Benign neoplasm of ascending colon: Secondary | ICD-10-CM

## 2014-12-09 MED ORDER — SODIUM CHLORIDE 0.9 % IV SOLN: 500.0000 mL | INTRAVENOUS | Status: DC

## 2014-12-09 NOTE — Progress Notes (Signed)
Called to room to assist during endoscopic procedure.  Patient ID and intended procedure confirmed with present staff. Received instructions for my participation in the procedure from the performing physician.  

## 2014-12-09 NOTE — Patient Instructions (Addendum)
  NO ASPIRIN, ASPIRIN PRODUCTS OR NSAIDS (MOTRIN, ADVIL,IBUPROFEN , MOBIC OR  ALEVE ) FOR THREE WEEKS, 12/30/14.  AWAIT PATHOLOGY RESULTS.   YOU HAD AN ENDOSCOPIC PROCEDURE TODAY AT [facility]:   Refer to the procedure report that was given to you for any specific questions about what was found during the examination.  If the procedure report does not answer your questions, please call your gastroenterologist to clarify.  If you requested that your care partner not be given the details of your procedure findings, then the procedure report has been included in a sealed envelope for you to review at your convenience later.  YOU SHOULD EXPECT: Some feelings of bloating in the abdomen. Passage of more gas than usual.  Walking can help get rid of the air that was put into your GI tract during the procedure and reduce the bloating. If you had a lower endoscopy (such as a colonoscopy or flexible sigmoidoscopy) you may notice spotting of blood in your stool or on the toilet paper. If you underwent a bowel prep for your procedure, you may not have a normal bowel movement for a few days.  Please Note:  You might notice some irritation and congestion in your nose or some drainage.  This is from the oxygen used during your procedure.  There is no need for concern and it should clear up in a day or so.  SYMPTOMS TO REPORT IMMEDIATELY:   Following lower endoscopy (colonoscopy or flexible sigmoidoscopy):  Excessive amounts of blood in the stool  Significant tenderness or worsening of abdominal pains  Swelling of the abdomen that is new, acute  Fever of 100F or higher   For urgent or emergent issues, a gastroenterologist can be reached at any hour by calling [phone].   DIET: Your first meal following the procedure should be a small meal and then it is ok to progress to your normal diet. Heavy or fried foods are harder to digest and may make you feel nauseous or bloated.  Likewise,  meals heavy in dairy and vegetables can increase bloating.  Drink plenty of fluids but you should avoid alcoholic beverages for 24 hours.  ACTIVITY:  You should plan to take it easy for the rest of today and you should NOT DRIVE or use heavy machinery until tomorrow (because of the sedation medicines used during the test).    FOLLOW UP: Our staff will call the number listed on your records the next business day following your procedure to check on you and address any questions or concerns that you may have regarding the information given to you following your procedure. If we do not reach you, we will leave a message.  However, if you are feeling well and you are not experiencing any problems, there is no need to return our call.  We will assume that you have returned to your regular daily activities without incident.  If any biopsies were taken you will be contacted by phone or by letter within the next 1-3 weeks.  Please call us at [phone] if you have not heard about the biopsies in 3 weeks.    SIGNATURES/CONFIDENTIALITY: You and/or your care partner have signed paperwork which will be entered into your electronic medical record.  These signatures attest to the fact that that the information above on your After Visit Summary has been reviewed and is understood.  Full responsibility of the confidentiality of this discharge information lies with you and/or your care-partner.

## 2014-12-09 NOTE — Op Note (Signed)
[place]  [address] [place], [address]   COLONOSCOPY PROCEDURE REPORT PATIENT: Veronica Greene, Veronica Greene  MR#: [id number] BIRTHDATE: 1945-11-12 , 68  yrs. old GENDER: female ENDOSCOPIST: Ladene Artist, MD, [facility] PROCEDURE DATE:  12/09/2014 PROCEDURE:   Colonoscopy, surveillance , Colonoscopy with snare polypectomy, and Submucosal injection, any substance First Screening Colonoscopy - Avg.  risk and is 50 yrs.  old or older - No.  Prior Negative Screening - Now for repeat screening. N/A  History of Adenoma - Now for follow-up colonoscopy & has been > or = to 3 yrs.  Yes hx of adenoma.  Has been 3 or more years since last colonoscopy.  Polyps removed today? Yes ASA CLASS:   Class II INDICATIONS:Surveillance due to prior colonic neoplasia and PH Colon Adenoma. MEDICATIONS: Monitored anesthesia care, Propofol 300 mg IV, and Glycopyrrolate (Robinul) 0.2 mg IV DESCRIPTION OF PROCEDURE:   After the risks benefits and alternatives of the procedure were thoroughly explained, informed consent was obtained.  The digital rectal exam revealed no abnormalities of the rectum.   The LB PFC-H190 [id number]  endoscope was introduced through the anus and advanced to the cecum, which was identified by both the appendix and ileocecal valve. No adverse events experienced  a tortuous colon.   The quality of the prep was adequate (Suprep was used)  The instrument was then slowly withdrawn as the colon was fully examined. Estimated blood loss is zero unless otherwise noted in this procedure report.  COLON FINDINGS: A sessile polyp measuring 24 mm in size was found in the ascending colon.  A polypectomy was performed in a piecemeal fashion using snare cautery.  The resection was complete, the polyp tissue was completely retrieved and sent to histology.  Injection (tattooing) was performed proximal and distal to the polyp site.  A sessile polyp measuring 10 mm in size was found in the  ascending colon.  A polypectomy was performed using snare cautery.  The resection was complete, the polyp tissue was completely retrieved and sent to histology. 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   The examination was otherwise normal.  Retroflexed views revealed no abnormalities. The time to cecum = 4.8 Withdrawal time = 24.7   The scope was withdrawn and the procedure completed. COMPLICATIONS: There were no immediate complications.  ENDOSCOPIC IMPRESSION: 1.   Sessile polyp in the ascending colon; polypectomy performed in a piecemeal fashion, snare cautery; Injection (tattooing) as performed 2.   Sessile polyp in the ascending colon; polypectomy performed using snare cautery 3.   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  Hold Aspirin and all other NSAIDS for 3 weeks. 2.  Await pathology results 3.  Repeat colonoscopy in  6-12 months if piecemeal polyp is precancerous adenomatous; otherwise 3 years  eSigned:  Ladene Artist, MD, [facility] 12/09/2014 11:21 AM  [C

## 2014-12-09 NOTE — Progress Notes (Signed)
Report to PACU, RN, vss, BBS= Clear.  

## 2014-12-10 ENCOUNTER — Telehealth: Payer: Self-pay

## 2014-12-10 NOTE — Telephone Encounter (Signed)
  Follow up Call-  Call back number 12/09/2014 08/05/2013  Post procedure Call Back phone  # [phone]  Permission to leave phone message Yes Yes     Patient questions:  Do you have a fever, pain , or abdominal swelling? No. Pain Score  0 *  Have you tolerated food without any problems? Yes.    Have you been able to return to your normal activities? Yes.    Do you have any questions about your discharge instructions: Diet   No. Medications  No. Follow up visit  No.  Do you have questions or concerns about your Care? No.  Actions: * If pain score is 4 or above: No action needed, pain <4.  No problems per the pt. maw

## 2014-12-17 ENCOUNTER — Encounter: Payer: Self-pay | Admitting: Gastroenterology

## 2015-01-19 ENCOUNTER — Encounter: Payer: Self-pay | Admitting: Podiatry

## 2015-01-19 ENCOUNTER — Ambulatory Visit (INDEPENDENT_AMBULATORY_CARE_PROVIDER_SITE_OTHER): Payer: PPO | Admitting: Podiatry

## 2015-01-19 VITALS — BP 102/51 | HR 76 | Resp 12

## 2015-01-19 DIAGNOSIS — Q828 Other specified congenital malformations of skin: Secondary | ICD-10-CM

## 2015-01-19 NOTE — Patient Instructions (Signed)
Okay to leave acid bandage on the right heel 1-5 days. Remove if it becomes wet Return as needed for debridement of painful callus on the right heel

## 2015-01-19 NOTE — Progress Notes (Signed)
   Subjective:    Patient ID: Veronica Greene, female    DOB: 03-12-1946, 69 y.o.   MRN: [id number]  HPI  N-SORE, THICK SKIN L-RT BOTTOM FOOT D-3+ YEARS O-SLOWLY C-WORSE A-PRESSURE T-TRIM DOWN, INSERTS  Review of Systems  Constitutional: Positive for fatigue.  HENT: Positive for hearing loss and sinus pressure.   Musculoskeletal: Positive for myalgias.  Hematological: Bruises/bleeds easily.   Patient is post polio as a history of limb shortage shoe and surgical treatment. She wears accommodative buildup in your shoes because her right leg is shorter than her left.    Objective:   Physical Exam  Orientated 3  Vascular: No peripheral edema noted bilaterally DP and PT pulses 2/4 bilaterally Capillary reflex immediate bilaterally  Neurological: Sensation to 10 g monofilament wire intact 5/5 bilaterally Vibratory sensation intact bilaterally Ankle 3 reactive bilaterally  Dermatological: Nucleated plantar keratoses right heel No open skin lesions noted bilaterally  Musculoskeletal: Left leg appears longer than right Atrophic calf right Surgical scars posterior right tendo Achilles and lateral right ankle area Right foot is physically smaller than left Hammertoe deformities 2-5 right and 1/5 left Plantar flexion 5/5 bilaterally and dorsi flexion 5/5 bilaterally      Assessment & Plan:   Assessment: Post polio right lower extremity Satisfactory neurovascular status Accommodative shoeing with buildup this patient is stable gait Porokeratosis right heel  Plan: Reviewed results of examination with patient today  I debrided plantar keratoses without any bleeding and packed with salinocaine patient advised that lesion will most likely reoccur and return as needed for debridement

## 2015-02-17 ENCOUNTER — Other Ambulatory Visit: Payer: Self-pay

## 2015-02-17 DIAGNOSIS — Z1231 Encounter for screening mammogram for malignant neoplasm of breast: Secondary | ICD-10-CM

## 2015-04-04 ENCOUNTER — Encounter: Payer: Self-pay | Admitting: Podiatry

## 2015-04-04 ENCOUNTER — Ambulatory Visit (INDEPENDENT_AMBULATORY_CARE_PROVIDER_SITE_OTHER): Payer: PPO | Admitting: Podiatry

## 2015-04-04 VITALS — BP 127/65 | HR 68 | Resp 16

## 2015-04-04 DIAGNOSIS — Q667 Congenital pes cavus: Secondary | ICD-10-CM | POA: Diagnosis not present

## 2015-04-04 DIAGNOSIS — Q828 Other specified congenital malformations of skin: Secondary | ICD-10-CM

## 2015-04-04 DIAGNOSIS — M722 Plantar fascial fibromatosis: Secondary | ICD-10-CM | POA: Diagnosis not present

## 2015-04-04 DIAGNOSIS — M216X9 Other acquired deformities of unspecified foot: Secondary | ICD-10-CM

## 2015-04-05 NOTE — Progress Notes (Signed)
Subjective:     Patient ID: Veronica Greene, female   DOB: February 20, 1946, 69 y.o.   MRN: [id number]  HPI patient presents with pain in the plantar aspect of both feet with lesions formations and history of surgery when she was young with history of polio of the right foot   Review of Systems     Objective:   Physical Exam Neurovascular status intact significant range of motion loss right with severe cavus foot deformity noted right and limb length discrepancy of approximately 1 inch 3/4 inch. Patient does have a modified shoe but does not have an insert that she wears that gives her any degree of relief    Assessment:     Poor foot structure leading to abnormal pressure points and pain    Plan:     Reviewed condition at great length discussing treatment options and debrided lesions and scanned for a lightweight type orthotic device. Patient tolerated this well and will be seen back when orthotics are returned

## 2015-04-14 ENCOUNTER — Encounter: Payer: Self-pay | Admitting: Gastroenterology

## 2015-04-26 ENCOUNTER — Ambulatory Visit: Payer: PPO | Admitting: *Deleted

## 2015-04-26 DIAGNOSIS — M216X9 Other acquired deformities of unspecified foot: Secondary | ICD-10-CM

## 2015-04-26 NOTE — Patient Instructions (Signed)

## 2015-04-26 NOTE — Progress Notes (Signed)
Patient ID: Veronica Greene, female   DOB: 07-20-1945, 69 y.o.   MRN: [id number] Patient presents for orthotic pick up.  Verbal and written break in and wear instructions given.  Patient will follow up in 4 weeks if symptoms worsen or fail to improve.

## 2015-05-02 ENCOUNTER — Ambulatory Visit: Payer: PPO

## 2015-05-03 ENCOUNTER — Ambulatory Visit
Admission: RE | Admit: 2015-05-03 | Discharge: 2015-05-03 | Disposition: A | Discharge status: Home / Self Care | Payer: PPO | Source: Ambulatory Visit

## 2015-05-03 DIAGNOSIS — Z1231 Encounter for screening mammogram for malignant neoplasm of breast: Secondary | ICD-10-CM

## 2015-05-03 LAB — HM MAMMOGRAPHY: HM Mammogram: NORMAL

## 2015-05-05 ENCOUNTER — Encounter: Payer: Self-pay | Admitting: *Deleted

## 2015-05-05 ENCOUNTER — Encounter: Payer: Self-pay | Admitting: Family Medicine

## 2015-05-26 ENCOUNTER — Encounter: Payer: Self-pay | Admitting: Podiatry

## 2015-05-26 ENCOUNTER — Ambulatory Visit (INDEPENDENT_AMBULATORY_CARE_PROVIDER_SITE_OTHER): Payer: PPO | Admitting: Podiatry

## 2015-05-26 VITALS — BP 141/64 | HR 64 | Resp 66

## 2015-05-26 DIAGNOSIS — B070 Plantar wart: Secondary | ICD-10-CM | POA: Diagnosis not present

## 2015-05-26 DIAGNOSIS — B078 Other viral warts: Secondary | ICD-10-CM

## 2015-05-26 DIAGNOSIS — B079 Viral wart, unspecified: Secondary | ICD-10-CM

## 2015-05-26 NOTE — Progress Notes (Signed)
Subjective:     Patient ID: Veronica Greene, female   DOB: [birth year], 69 y.o.   MRN: [id number]  HPI patient states doing pretty well with a painful callus on my plantar right heel that needs to be trimmed   Review of Systems     Objective:   Physical Exam Neurovascular status intact keratotic lesion sub-heel right    Assessment:     Chronic heel irritation with keratotic lesion sub-heel right    Plan:     Debride heel and reappoint as needed

## 2015-06-09 ENCOUNTER — Ambulatory Visit (AMBULATORY_SURGERY_CENTER): Payer: Self-pay

## 2015-06-09 VITALS — Ht 64.0 in | Wt 189.0 lb

## 2015-06-09 DIAGNOSIS — Z86010 Personal history of colonic polyps: Secondary | ICD-10-CM

## 2015-06-09 MED ORDER — NA SULFATE-K SULFATE-MG SULF 17.5-3.13-1.6 GM/177ML PO SOLN: 1.0000 | Freq: Once | ORAL | Status: DC

## 2015-06-09 NOTE — Progress Notes (Signed)
No egg or soy allergies Not on home 02 No previous anesthesia complications No diet or weight loss meds 

## 2015-06-23 ENCOUNTER — Encounter: Payer: Self-pay | Admitting: Gastroenterology

## 2015-06-23 ENCOUNTER — Ambulatory Visit (AMBULATORY_SURGERY_CENTER): Payer: PPO | Admitting: Gastroenterology

## 2015-06-23 VITALS — BP 135/60 | HR 60 | Temp 97.0°F | Resp 19 | Ht 64.0 in | Wt 189.0 lb

## 2015-06-23 DIAGNOSIS — D122 Benign neoplasm of ascending colon: Secondary | ICD-10-CM

## 2015-06-23 DIAGNOSIS — Z86010 Personal history of colonic polyps: Secondary | ICD-10-CM

## 2015-06-23 MED ORDER — SODIUM CHLORIDE 0.9 % IV SOLN: 500.0000 mL | INTRAVENOUS | Status: DC

## 2015-06-23 NOTE — Progress Notes (Signed)
Called to room to assist during endoscopic procedure.  Patient ID and intended procedure confirmed with present staff. Received instructions for my participation in the procedure from the performing physician.  

## 2015-06-23 NOTE — Patient Instructions (Signed)
YOU HAD AN ENDOSCOPIC PROCEDURE TODAY AT [facility]:   Refer to the procedure report that was given to you for any specific questions about what was found during the examination.  If the procedure report does not answer your questions, please call your gastroenterologist to clarify.  If you requested that your care partner not be given the details of your procedure findings, then the procedure report has been included in a sealed envelope for you to review at your convenience later.  YOU SHOULD EXPECT: Some feelings of bloating in the abdomen. Passage of more gas than usual.  Walking can help get rid of the air that was put into your GI tract during the procedure and reduce the bloating. If you had a lower endoscopy (such as a colonoscopy or flexible sigmoidoscopy) you may notice spotting of blood in your stool or on the toilet paper. If you underwent a bowel prep for your procedure, you may not have a normal bowel movement for a few days.  Please Note:  You might notice some irritation and congestion in your nose or some drainage.  This is from the oxygen used during your procedure.  There is no need for concern and it should clear up in a day or so.  SYMPTOMS TO REPORT IMMEDIATELY:   Following upper endoscopy (EGD)  Vomiting of blood or coffee ground material  New chest pain or pain under the shoulder blades  Painful or persistently difficult swallowing  New shortness of breath  Fever of 100F or higher  Black, tarry-looking stools  For urgent or emergent issues, a gastroenterologist can be reached at any hour by calling [phone].   DIET: Your first meal following the procedure should be a small meal and then it is ok to progress to your normal diet. Heavy or fried foods are harder to digest and may make you feel nauseous or bloated.  Likewise, meals heavy in dairy and vegetables can increase bloating.  Drink plenty of fluids but you should avoid alcoholic beverages for  24 hours.  ACTIVITY:  You should plan to take it easy for the rest of today and you should NOT DRIVE or use heavy machinery until tomorrow (because of the sedation medicines used during the test).    FOLLOW UP: Our staff will call the number listed on your records the next business day following your procedure to check on you and address any questions or concerns that you may have regarding the information given to you following your procedure. If we do not reach you, we will leave a message.  However, if you are feeling well and you are not experiencing any problems, there is no need to return our call.  We will assume that you have returned to your regular daily activities without incident.  If any biopsies were taken you will be contacted by phone or by letter within the next 1-3 weeks.  Please call us at [phone] if you have not heard about the biopsies in 3 weeks.    SIGNATURES/CONFIDENTIALITY: You and/or your care partner have signed paperwork which will be entered into your electronic medical record.  These signatures attest to the fact that that the information above on your After Visit Summary has been reviewed and is understood.  Full responsibility of the confidentiality of this discharge information lies with you and/or your care-partner.  Polyp handout given Hold Aspirin, Advil,Aleve, Naprosyn and Ibuprofen for 2 weeks Repeat Colonoscopy in 2 years

## 2015-06-23 NOTE — Op Note (Signed)
[place]  [address] [place], [address]   COLONOSCOPY PROCEDURE REPORT  PATIENT: Veronica Greene, Veronica Greene  MR#: [id number] BIRTHDATE: 06/24/1946 , 69  yrs. old GENDER: female ENDOSCOPIST: Ladene Artist, MD, [facility] PROCEDURE DATE:  06/23/2015 PROCEDURE:   Colonoscopy, surveillance and Colonoscopy with snare polypectomy First Screening Colonoscopy - Avg.  risk and is 50 yrs.  old or older - No.  Prior Negative Screening - Now for repeat screening. N/A  History of Adenoma - Now for follow-up colonoscopy & has been > or = to 3 yrs.  No.  It has been less than 3 yrs since last colonoscopy.  Medical reason.  Polyps removed today? Yes ASA CLASS:   Class II INDICATIONS:Surveillance due to prior colonic neoplasia and Advanced Neoplasm (= 10 mm, high grade dysplasia, villous component. MEDICATIONS: Monitored anesthesia care and Propofol 250 mg IV DESCRIPTION OF PROCEDURE:   After the risks benefits and alternatives of the procedure were thoroughly explained, informed consent was obtained.  The digital rectal exam revealed no abnormalities of the rectum.   The LB PFC-H190 [id number]  endoscope was introduced through the anus and advanced to the cecum, which was identified by both the appendix and ileocecal valve. No adverse events experienced.   The quality of the prep was good.  (Suprep was used)  The instrument was then slowly withdrawn as the colon was fully examined. Estimated blood loss is zero unless otherwise noted in this procedure report.    COLON FINDINGS: Tattoos were found in the ascending colon. A sessile polyp measuring 10 mm in size was found in the ascending colon.  A polypectomy was performed using snare cautery.  The resection was complete, the polyp tissue was completely retrieved and sent to histology. A sessile polyp measuring 7 mm in size was found in the ascending colon at the site of the prior polypectomy with superficial scar.  A polypectomy was  performed using snare cautery. The resection was complete, the polyp tissue was completely retrieved and sent to histology.  The examination was otherwise normal.  Retroflexed views revealed no abnormalities. The time to cecum = 3.8 Withdrawal time = 13.0   The scope was withdrawn and the procedure completed. COMPLICATIONS: There were no immediate complications.  ENDOSCOPIC IMPRESSION: 1.   Sessile polyp in the ascending colon; polypectomy performed using snare cautery 2.   Sessile polyp associated prior polypectomy site in the ascending colon; polypectomy performed using snare cautery 3.   Ascending colon tattoos  RECOMMENDATIONS: 1.  Await pathology results 2.  Hold Aspirin and all other NSAIDS for 2 weeks. 3.  Repeat Colonoscopy in 2 years.  eSigned:  Ladene Artist, MD, [facility] 06/23/2015 11:40 AM

## 2015-06-24 ENCOUNTER — Telehealth: Payer: Self-pay | Admitting: Gastroenterology

## 2015-06-24 ENCOUNTER — Ambulatory Visit (HOSPITAL_COMMUNITY)
Admission: RE | Admit: 2015-06-24 | Discharge: 2015-06-24 | Disposition: A | Discharge status: Home / Self Care | Payer: PPO | Source: Ambulatory Visit | Attending: Internal Medicine | Admitting: Internal Medicine

## 2015-06-24 ENCOUNTER — Encounter (HOSPITAL_COMMUNITY)
Admission: RE | Disposition: A | Discharge status: Home / Self Care | Payer: Self-pay | Source: Ambulatory Visit | Attending: Internal Medicine

## 2015-06-24 ENCOUNTER — Other Ambulatory Visit: Payer: Self-pay | Admitting: Internal Medicine

## 2015-06-24 ENCOUNTER — Encounter (HOSPITAL_COMMUNITY): Payer: Self-pay | Admitting: Physician Assistant

## 2015-06-24 ENCOUNTER — Telehealth: Payer: Self-pay | Admitting: Emergency Medicine

## 2015-06-24 ENCOUNTER — Other Ambulatory Visit: Payer: Self-pay

## 2015-06-24 DIAGNOSIS — M1990 Unspecified osteoarthritis, unspecified site: Secondary | ICD-10-CM | POA: Diagnosis not present

## 2015-06-24 DIAGNOSIS — K219 Gastro-esophageal reflux disease without esophagitis: Secondary | ICD-10-CM | POA: Insufficient documentation

## 2015-06-24 DIAGNOSIS — Z86010 Personal history of colonic polyps: Secondary | ICD-10-CM | POA: Diagnosis not present

## 2015-06-24 DIAGNOSIS — I10 Essential (primary) hypertension: Secondary | ICD-10-CM | POA: Insufficient documentation

## 2015-06-24 DIAGNOSIS — M109 Gout, unspecified: Secondary | ICD-10-CM | POA: Diagnosis not present

## 2015-06-24 DIAGNOSIS — K633 Ulcer of intestine: Secondary | ICD-10-CM | POA: Diagnosis not present

## 2015-06-24 DIAGNOSIS — Z8612 Personal history of poliomyelitis: Secondary | ICD-10-CM | POA: Insufficient documentation

## 2015-06-24 DIAGNOSIS — Z79899 Other long term (current) drug therapy: Secondary | ICD-10-CM | POA: Diagnosis not present

## 2015-06-24 DIAGNOSIS — K921 Melena: Secondary | ICD-10-CM

## 2015-06-24 DIAGNOSIS — Z79890 Hormone replacement therapy (postmenopausal): Secondary | ICD-10-CM | POA: Insufficient documentation

## 2015-06-24 DIAGNOSIS — Z7951 Long term (current) use of inhaled steroids: Secondary | ICD-10-CM | POA: Insufficient documentation

## 2015-06-24 DIAGNOSIS — Z7982 Long term (current) use of aspirin: Secondary | ICD-10-CM | POA: Insufficient documentation

## 2015-06-24 HISTORY — DX: Other specified anxiety disorders: F41.8

## 2015-06-24 HISTORY — DX: Allergic rhinitis, unspecified: J30.9

## 2015-06-24 HISTORY — PX: COLONOSCOPY: SHX5424

## 2015-06-24 HISTORY — DX: Acute poliomyelitis, unspecified: A80.9

## 2015-06-24 LAB — CBC
HEMATOCRIT: 30.9 % — AB (ref 36.0–46.0)
Hemoglobin: 10.9 g/dL — ABNORMAL LOW (ref 12.0–15.0)
MCH: 30.7 pg (ref 26.0–34.0)
MCHC: 35.3 g/dL (ref 30.0–36.0)
MCV: 87 fL (ref 78.0–100.0)
Platelets: 223 10*3/uL (ref 150–400)
RBC: 3.55 MIL/uL — ABNORMAL LOW (ref 3.87–5.11)
RDW: 12.9 % (ref 11.5–15.5)
WBC: 8 10*3/uL (ref 4.0–10.5)

## 2015-06-24 SURGERY — COLONOSCOPY
Anesthesia: Moderate Sedation

## 2015-06-24 MED ORDER — ASPIRIN 81 MG PO TBEC: 81.0000 mg | DELAYED_RELEASE_TABLET | Freq: Every day | ORAL | Status: DC

## 2015-06-24 MED ORDER — DIPHENHYDRAMINE HCL 50 MG/ML IJ SOLN
INTRAMUSCULAR | Status: AC
  Filled 2015-06-24: qty 1

## 2015-06-24 MED ORDER — IBUPROFEN 600 MG PO TABS: 600.0000 mg | ORAL_TABLET | ORAL | Status: DC | PRN

## 2015-06-24 MED ORDER — EPINEPHRINE HCL 0.1 MG/ML IJ SOSY
PREFILLED_SYRINGE | INTRAMUSCULAR | Status: AC
  Filled 2015-06-24: qty 10

## 2015-06-24 MED ORDER — MIDAZOLAM HCL 5 MG/5ML IJ SOLN
INTRAMUSCULAR | Status: DC | PRN
  Administered 2015-06-24: 2 mg via INTRAVENOUS
  Administered 2015-06-24: 1 mg via INTRAVENOUS
  Administered 2015-06-24 (×2): 2 mg via INTRAVENOUS

## 2015-06-24 MED ORDER — MELOXICAM 7.5 MG PO TABS: ORAL_TABLET | ORAL | Status: DC

## 2015-06-24 MED ORDER — FENTANYL CITRATE (PF) 100 MCG/2ML IJ SOLN
INTRAMUSCULAR | Status: DC | PRN
  Administered 2015-06-24 (×3): 25 ug via INTRAVENOUS

## 2015-06-24 MED ORDER — SODIUM CHLORIDE 0.9 % IV SOLN: INTRAVENOUS | Status: DC

## 2015-06-24 MED ORDER — LACTATED RINGERS IV SOLN
INTRAVENOUS | Status: DC
  Administered 2015-06-24: 1000 mL via INTRAVENOUS

## 2015-06-24 MED ORDER — MIDAZOLAM HCL 5 MG/ML IJ SOLN
INTRAMUSCULAR | Status: AC
  Filled 2015-06-24: qty 2

## 2015-06-24 MED ORDER — EPINEPHRINE HCL 0.1 MG/ML IJ SOSY
PREFILLED_SYRINGE | INTRAMUSCULAR | Status: DC | PRN
  Administered 2015-06-24: 6 mL

## 2015-06-24 MED ORDER — FENTANYL CITRATE (PF) 100 MCG/2ML IJ SOLN
INTRAMUSCULAR | Status: AC
  Filled 2015-06-24: qty 2

## 2015-06-24 NOTE — Discharge Instructions (Addendum)
° °  I found and treated the bleeding site.  You will still see blood in your stools for the next 1-2 days.  You need to come to our lab on Monday 12/19 and get blood count checked again.  Please check in with me tomorrow by phone with an update and sooner if needed. Watch out for increase in bleeding, dizziness, light-headedness. If any concerns do not hesitate to come to emergency department.  Gatha Mayer, MD, FACG  YOU HAD AN ENDOSCOPIC PROCEDURE TODAY: Refer to the procedure report and other information in the discharge instructions given to you for any specific questions about what was found during the examination. If this information does not answer your questions, please call Dr. Celesta Aver office at [phone] to clarify.   YOU SHOULD EXPECT: Some feelings of bloating in the abdomen. Passage of more gas than usual. Walking can help get rid of the air that was put into your GI tract during the procedure and reduce the bloating. If you had a lower endoscopy (such as a colonoscopy or flexible sigmoidoscopy) you may notice spotting of blood in your stool or on the toilet paper. Some abdominal soreness may be present for a day or two, also.  DIET: Your first meal following the procedure should be a light meal and then it is ok to progress to your normal diet. A half-sandwich or bowl of soup is an example of a good first meal. Heavy or fried foods are harder to digest and may make you feel nauseous or bloated. Drink plenty of fluids but you should avoid alcoholic beverages for 24 hours.   ACTIVITY: Your care partner should take you home directly after the procedure. You should plan to take it easy, moving slowly for the rest of the day. You can resume normal activity the day after the procedure however YOU SHOULD NOT DRIVE, use power tools, machinery or perform tasks that involve climbing or major physical exertion for 24 hours (because of the sedation medicines used during the test).    SYMPTOMS TO REPORT IMMEDIATELY: A gastroenterologist can be reached at any hour. Please call [phone]  for any of the following symptoms:  Following lower endoscopy (colonoscopy, flexible sigmoidoscopy) Excessive amounts of blood in the stool - you will still see some as we discussed Significant tenderness, worsening of abdominal pains  Swelling of the abdomen that is new, acute  Fever of 100 or higher    FOLLOW UP:  If any biopsies were taken you will be contacted by phone or by letter within the next 1-3 weeks. Call [phone]  if you have not heard about the biopsies in 3 weeks.  Please also call with any specific questions about appointments or follow up tests.

## 2015-06-24 NOTE — Telephone Encounter (Signed)
Orders are in epic

## 2015-06-24 NOTE — Telephone Encounter (Signed)
Left message, no identifier present

## 2015-06-24 NOTE — Telephone Encounter (Signed)
Pt had colon yesterday with Dr. Fuller Plan. Pt states last night she had multiple dark stools and cramping. Pt states this morning she is passing a lot of blood in the stool. States is it red. Pt instructed to go to [facility] ER to be evaluated. Pt verbalized understanding. Hospital doctor notified.

## 2015-06-24 NOTE — Telephone Encounter (Signed)
I have spoken to the patient and anticipate a colonoscopy today. Please enter orders for one - moderate sedation at [facility] They will be looking for the orders

## 2015-06-24 NOTE — Op Note (Signed)
[facility] [place], [address]   COLONOSCOPY PROCEDURE REPORT  PATIENT: Veronica, Greene  MR#: [id number] BIRTHDATE: [birth year] , 69  yrs. old GENDER: female ENDOSCOPIST: Gatha Mayer, MD, [facility] PROCEDURE DATE:  06/24/2015 PROCEDURE:   Colonoscopy, diagnostic and Colonoscopy with control of bleeding First Screening Colonoscopy - Avg.  risk and is 50 yrs.  old or older - No.  Prior Negative Screening - Now for repeat screening. N/A  History of Adenoma - Now for follow-up colonoscopy & has been > or = to 3 yrs.  N/A  Polyps removed today? No ASA CLASS:   Class III INDICATIONS:Treatment of bleeding from such lesions as vascular malformation, ulceration, neoplasia, & polypectomy site and Patient is not applicable for Colorectal Neoplasm Risk Assessment for this procedure. MEDICATIONS: Fentanyl 75 mcg IV and Versed 7 mg IV  DESCRIPTION OF PROCEDURE:   After the risks benefits and alternatives of the procedure were thoroughly explained, informed consent was obtained.  The digital rectal exam revealed no abnormalities of the rectum.   The Pentax Colonoscope [id number] endoscope was introduced through the anus and advanced to the cecum, which was identified by both the appendix and ileocecal valve. No adverse events experienced.   The quality of the prep was none poor.  The instrument was then slowly withdrawn as the colon was fully examined. Estimated blood loss is zero unless otherwise noted in this procedure report.      COLON FINDINGS: 1) Two polypectomy ulcers in ascending colon.  One relatively clean-based near tattoo.  the other closer to IC valve with an adherent fresh clot. 2) Ulcer with clot was clipped x 4 and 6 cc EPI injected around it.  3) Blood throughout colon otherwise - suctioned - no other findings but unable to comment due to adherent blood/clots.  Retroflexion was not performed. The time to cecum = 10.9 Withdrawal time = 14.8 The  scope was withdrawn and the procedure completed. COMPLICATIONS: There were no immediate complications.  ENDOSCOPIC IMPRESSION: 1) Two polypectomy ulcers in ascending colon.  One relatively clean-based near tattoo.  the other closer to IC valve with an adherent fresh clot. 2) Ulcer with clot was clipped x 4 and 6 cc EPI injected around it.  3) Blood throughout colon otherwise - suctioned - no other findings but unable to comment due to adherent blood/clots  RECOMMENDATIONS: 1.  Hold Aspirin and all other NSAIDS for 2 weeks. 2.  She will go home today and call me for f/u tomorrow - sooner if needed and return if needed. CBC 12/19 - ordered  eSigned:  Gatha Mayer, MD, [facility] 06/24/2015 2:46 PM   cc: Lucio Edward, MD

## 2015-06-24 NOTE — H&P (Signed)
[facility] Admission Note   Primary Care Physician:  Loura Pardon, MD Primary Gastroenterologist:  Dr. Fuller Plan  CHIEF COMPLAINT:  Hematochezia following colonoscopy with polypectomy.   HPI: Veronica Greene is a 69 y.o. female.  PMH adenomatous colon polyps with HGD.  IBS.   GERD with gastritis on EGD path from 2011.  Negative h pylori, negative esophageal eosinophils, negative villous atrophy on upper endoscopic biopsies 2011, 07/2013. Previous adnomas and tubulovillous adenomas on colonoscopies in 2011, 10/2010, 07/2013, 12/2014.  Colonoscopy 06/23/15 for adenoma (10 mm, high grade dysplasia, villous component) surveillance.   Dr stark removed sessile polyps in ascending colon (10 mm and 7 mm).  The 76mm removed from previously tattood site.   Last night had cramping and 5 or 6 dark stools, then bloody stools x 4 this AM. Volume of blood has diminished but initially transformed commode water to blood.  Arrangements made for unprepped colonoscopy this afternoon. Last ASA and NSAIDs: 12/11 or 12/12.  No nausea, no dizziness, no tachy, no palpitations, no extremity swelling.    Past Medical History  Diagnosis Date  . Allergic rhinitis   . GERD (gastroesophageal reflux disease)   . Hypertension   . Depression with anxiety   . Urinary incontinence   . Osteopenia   . Cataracts, bilateral     early cataracts  . Gout   . Sinusitis, chronic   . IBS (irritable bowel syndrome)     with constipation  . Arthritis   . Heart murmur 2013  . Polio age 62 months    right leg weakness and 1.5 inches shorter than left leg.     Past Surgical History  Procedure Laterality Date  . Hand surgery Bilateral 1999  . Carpal tunnel release Bilateral 1986, 1993  . Thumb arthroscopy  1999  . Tonsillectomy  429    69 years old  . Colonoscopy  11/09/2011  . Polio surgery.    . Ankle surgery Right 1956  . Colonoscopy w/ polypectomy  10/21/2009  .  Nasal sinus surgery  1976  . Knee surgery Bilateral 1998    x2  . Heart murmer  2014    Prior to Admission medications   Medication Sig Start Date End Date Taking? Authorizing Provider  Ascorbic Acid (VITAMIN C) 1000 MG tablet Take 1,000 mg by mouth daily.      Historical Provider, MD  aspirin 81 MG EC tablet Take 81 mg by mouth daily.      Historical Provider, MD  Calcium Carbonate-Vitamin D (CALCIUM-VITAMIN D) 500-200 MG-UNIT per tablet Take 1 tablet by mouth daily. Take 400 units daily     Historical Provider, MD  cholecalciferol (VITAMIN D) 1000 UNITS tablet Take 1,000 Units by mouth daily.    Historical Provider, MD  colchicine 0.6 MG tablet Take 0.6 mg by mouth as needed. Reported on 06/23/2015    Historical Provider, MD  conjugated estrogens (PREMARIN) vaginal cream Place 1 Applicatorful vaginally daily.    Historical Provider, MD  cyclobenzaprine (FLEXERIL) 10 MG tablet Take 10 mg by mouth as needed for muscle spasms. Reported on 06/23/2015    Historical Provider, MD  Dextromethorphan-Guaifenesin (MUCUS-DM MAX) 60-1200 MG TB12 Take by mouth.    Historical Provider, MD  esomeprazole (NEXIUM) 40 MG capsule Take 1 capsule (40 mg total) by mouth 2 (two) times daily. 10/12/14   Abner Greenspan, MD  hydrochlorothiazide (HYDRODIURIL) 25 MG tablet Take 25 mg by mouth daily. Reported on 06/23/2015    Historical Provider,  MD  HYDROcodone-acetaminophen (NORCO/VICODIN) 5-325 MG tablet Take 1 tablet by mouth as needed for moderate pain. Reported on 06/23/2015    Historical Provider, MD  ibuprofen (ADVIL,MOTRIN) 600 MG tablet Take 600 mg by mouth as needed. Reported on 06/23/2015    Historical Provider, MD  lubiprostone (AMITIZA) 24 MCG capsule TAKE 1 CAPSULE BY MOUTH TWICE A DAY. 10/12/14   Abner Greenspan, MD  meloxicam (MOBIC) 7.5 MG tablet TAKE 1 TABLET BY MOUTH DAILY WITH FOOD. Patient not taking: Reported on 06/23/2015 10/12/14   Abner Greenspan, MD  mometasone (NASONEX) 50 MCG/ACT nasal spray Place 2  sprays into the nose daily. 10/12/14   Abner Greenspan, MD  Multiple Vitamin (MULTIVITAMIN) tablet Take 1 tablet by mouth daily.      Historical Provider, MD  potassium chloride (K-DUR) 10 MEQ tablet Take 2 tablets (20 mEq total) by mouth 2 (two) times daily. 10/12/14   Abner Greenspan, MD  sennosides-docusate sodium (SENOKOT-S) 8.6-50 MG tablet Take 1 tablet by mouth daily.    Historical Provider, MD  Simethicone (PHAZYME PO) Take by mouth.    Historical Provider, MD  venlafaxine (EFFEXOR) 75 MG tablet Take 75 mg by mouth 2 (two) times daily.    Historical Provider, MD    No current facility-administered medications for this encounter.    Allergies as of 06/24/2015 - Review Complete 06/24/2015  Allergen Reaction Noted  . Clarithromycin  08/26/2007  . Codeine  08/26/2007  . Paroxetine  08/26/2007  . Penicillins  08/26/2007    Family History  Problem Relation Age of Onset  . Kidney disease Mother   . Stroke Father   . Colon cancer Father 47  . Kidney disease Sister   . Cancer Paternal Aunt     COLON CA  . Colon cancer Paternal Aunt 32    Social History   Social History  . Marital Status: Widowed    Spouse Name: N/A  . Number of Children: 1  . Years of Education: N/A   Occupational History  . RECEPTION    Social History Main Topics  . Smoking status: Never Smoker   . Smokeless tobacco: Never Used  . Alcohol Use: No  . Drug Use: No  . Sexual Activity: Not on file   Other Topics Concern  . Not on file   Social History Narrative    REVIEW OF SYSTEMS: Constitutional:  Stable weight,  No weakness or fatigue ENT:  Chronic sinus congestion managed with meds Pulm:  No SOB or cough CV:  No chest pain, no edema, no palpitations.  + heart murmer but never had echo.  GU:  No dysuria, no incontinence, no frequencly GI:  Per HPI.  Occasional bloat/cramping managed with prn meds.  Heme:  No issues with excessive bleeding or bruising generally.    Transfusions:  none Neuro:   Weakness in right leg Derm:  No rash or sores Endocrine:  No excessive thirst, no sweats.  Immunization:  Reviewed, flu shot up to date   PHYSICAL EXAM:  Pulse Rate:  [60] 60 (12/15 1200) Resp:  [19] 19 (12/15 1200) BP: (135)/(60) 135/60 mmHg (12/15 1200) SpO2:  [100 %] 100 % (12/15 1200)   General:  Pleasant, looks well, comfortable Ears:  Not HOH Mouth:  Moist, clear oral MM  Neck: no mass, no TMG, no JVD Lungs:  Clear bil.  No cough or dyspnea   Heart:  RRR with 1/6 harsh systolic murmer  Abdomen:  Soft, ND, NT.  No mass or HSM.  Active BBS    Rectal:  deferred  Msk:  No joint erythema or redness, right leg short   Extremities:  No CCE  Neurologic: oriented x 3.  Moves all limbs, strength not tested Skin:  No rash or sores Psych:  Pleasant, cooperative, excellent historian.       IMPRESSION:   *  Post polypectomy bleed  PLAN: *  unprepped colonoscopy this afternoon.   *  CBC now.  Lab Results  Component Value Date   WBC 8.0 06/24/2015   HGB 10.9* 06/24/2015   HCT 30.9* 06/24/2015   MCV 87.0 06/24/2015   PLT 223 06/24/2015        Azucena Freed  06/24/2015, 11:43 AM Pager [facility] Attending   I have taken an interval history, reviewed the chart and examined the patient. I agree with the Advanced Practitioner's note, impression and recommendations.    Gatha Mayer, MD, [facility] [phone] (pager) [phone] after 5 PM, weekends and holidays  06/24/2015 1:23 PM

## 2015-06-27 ENCOUNTER — Other Ambulatory Visit: Payer: Self-pay

## 2015-06-27 ENCOUNTER — Encounter (HOSPITAL_COMMUNITY): Payer: Self-pay | Admitting: Internal Medicine

## 2015-06-27 ENCOUNTER — Other Ambulatory Visit (INDEPENDENT_AMBULATORY_CARE_PROVIDER_SITE_OTHER): Payer: PPO

## 2015-06-27 DIAGNOSIS — K921 Melena: Secondary | ICD-10-CM

## 2015-06-27 LAB — CBC WITH DIFFERENTIAL/PLATELET
BASOS ABS: 0 10*3/uL (ref 0.0–0.1)
Basophils Relative: 0.3 % (ref 0.0–3.0)
EOS ABS: 0.2 10*3/uL (ref 0.0–0.7)
Eosinophils Relative: 3.1 % (ref 0.0–5.0)
HEMATOCRIT: 25.9 % — AB (ref 36.0–46.0)
HEMOGLOBIN: 8.8 g/dL — AB (ref 12.0–15.0)
LYMPHS PCT: 29.2 % (ref 12.0–46.0)
Lymphs Abs: 1.6 10*3/uL (ref 0.7–4.0)
MCHC: 33.8 g/dL (ref 30.0–36.0)
MCV: 89.5 fl (ref 78.0–100.0)
MONO ABS: 0.3 10*3/uL (ref 0.1–1.0)
Monocytes Relative: 5 % (ref 3.0–12.0)
Neutro Abs: 3.4 10*3/uL (ref 1.4–7.7)
Neutrophils Relative %: 62.4 % (ref 43.0–77.0)
Platelets: 198 10*3/uL (ref 150.0–400.0)
RBC: 2.89 Mil/uL — AB (ref 3.87–5.11)
RDW: 13.5 % (ref 11.5–15.5)
WBC: 5.4 10*3/uL (ref 4.0–10.5)

## 2015-06-27 NOTE — Progress Notes (Signed)
Quick Note:  I had her do a CBC after she has post-polypectomy bleed Tx Friday Linda please tell her its down a bit but as long as not passing blood anymore or if its clearing ok  She should take ferrous sulfate 325 mg daily x 2 months Further plans per Dr. Fuller Plan - I cced him ______

## 2015-07-01 ENCOUNTER — Encounter: Payer: Self-pay | Admitting: Gastroenterology

## 2015-07-18 ENCOUNTER — Other Ambulatory Visit: Payer: PPO | Admitting: Podiatry

## 2015-07-19 ENCOUNTER — Ambulatory Visit: Payer: PPO | Admitting: Gastroenterology

## 2015-07-19 ENCOUNTER — Other Ambulatory Visit (INDEPENDENT_AMBULATORY_CARE_PROVIDER_SITE_OTHER): Payer: PPO

## 2015-07-19 DIAGNOSIS — K921 Melena: Secondary | ICD-10-CM

## 2015-07-19 LAB — CBC WITH DIFFERENTIAL/PLATELET
BASOS ABS: 0 10*3/uL (ref 0.0–0.1)
BASOS PCT: 0.2 % (ref 0.0–3.0)
EOS PCT: 2.6 % (ref 0.0–5.0)
Eosinophils Absolute: 0.1 10*3/uL (ref 0.0–0.7)
HCT: 35.8 % — ABNORMAL LOW (ref 36.0–46.0)
Hemoglobin: 12.1 g/dL (ref 12.0–15.0)
LYMPHS ABS: 1.3 10*3/uL (ref 0.7–4.0)
Lymphocytes Relative: 23.4 % (ref 12.0–46.0)
MCHC: 33.9 g/dL (ref 30.0–36.0)
MCV: 90.6 fl (ref 78.0–100.0)
MONOS PCT: 4.8 % (ref 3.0–12.0)
Monocytes Absolute: 0.3 10*3/uL (ref 0.1–1.0)
NEUTROS ABS: 3.8 10*3/uL (ref 1.4–7.7)
NEUTROS PCT: 69 % (ref 43.0–77.0)
PLATELETS: 252 10*3/uL (ref 150.0–400.0)
RBC: 3.96 Mil/uL (ref 3.87–5.11)
RDW: 14.4 % (ref 11.5–15.5)
WBC: 5.6 10*3/uL (ref 4.0–10.5)

## 2015-07-21 ENCOUNTER — Ambulatory Visit (INDEPENDENT_AMBULATORY_CARE_PROVIDER_SITE_OTHER): Payer: PPO | Admitting: Physician Assistant

## 2015-07-21 ENCOUNTER — Encounter: Payer: Self-pay | Admitting: Physician Assistant

## 2015-07-21 VITALS — BP 122/64 | HR 60 | Ht 64.0 in | Wt 187.0 lb

## 2015-07-21 DIAGNOSIS — D500 Iron deficiency anemia secondary to blood loss (chronic): Secondary | ICD-10-CM

## 2015-07-21 DIAGNOSIS — Z860100 Personal history of colon polyps, unspecified: Secondary | ICD-10-CM

## 2015-07-21 NOTE — Progress Notes (Signed)
Reviewed and agree with management plan.  Mattie Nordell T. Shahzain Kiester, MD FACG 

## 2015-07-21 NOTE — Patient Instructions (Signed)
Stop taking Iron pills.   Repeat CBC in one month. (we have put in an order for you and we will call and remind you)

## 2015-07-21 NOTE — Progress Notes (Signed)
Patient ID: Veronica Greene, female   DOB: [birth year], 70 y.o.   MRN: [id number]     History of Present Illness: Veronica Greene  is a 70 y.o. female. PMH adenomatous colon polyps with HGD. IBS.  GERD with gastritis on EGD path from 2011. Negative h pylori, negative esophageal eosinophils, negative villous atrophy on upper endoscopic biopsies 2011, 07/2013. Previous adnomas and tubulovillous adenomas on colonoscopies in 2011, 10/2010, 07/2013, 12/2014.  Colonoscopy 06/23/15 for adenoma (10 mm, high grade dysplasia, villous component) surveillance.  Dr stark removed sessile polyps in ascending colon (10 mm and 7 mm). The 30mm removed from previously tattood site. Later that evening she began to have cramping all of by 5 or 6 dark stools, and then 4 episodes of bright red bloody stools. She was admitted to the hospital and had a repeat colonoscopy on 06/24/2015 by Dr. Carlean Purl. She was noted to have 2 polypectomy ulcers in the ascending colon. One relatively clean base near tattoo, the other closer to IC valve with an adherent fresh clot. Ulcer with clot was clipped 4 and 6 mL epi-injected around it. Blood throughout colon otherwise suctioned, no other findings but unable to comment due to adherent blood clots. She was advised to hold her aspirin and all other nonsteroidal anti-inflammatory drugs for 2 weeks and was started on iron. She is here for follow-up today she states she feels great she is moving her bowels well her stools are a little dark but she attributes that to the iron. She did receive a letter in the mail that she is due for her surveillance colonoscopy in 2 years. CBC on January 10 revealed hemoglobin of 12.1 with hematocrit of 35.8 and MCV of 90.6.  Past Medical History  Diagnosis Date  . Allergic rhinitis   . GERD (gastroesophageal reflux disease)   . Hypertension   . Depression with anxiety   . Urinary incontinence   . Osteopenia   . Cataracts, bilateral     early cataracts  .  Gout   . Sinusitis, chronic   . IBS (irritable bowel syndrome)     with constipation  . Arthritis   . Heart murmur 2013  . Polio age 53 months    right leg weakness and 1.5 inches shorter than left leg.     Past Surgical History  Procedure Laterality Date  . Hand surgery Bilateral 1999  . Carpal tunnel release Bilateral 1986, 1993  . Thumb arthroscopy  1999  . Tonsillectomy  815    70 years old  . Colonoscopy  11/09/2011  . Polio surgery.    . Ankle surgery Right 1956  . Colonoscopy w/ polypectomy  10/21/2009  . Nasal sinus surgery  1976  . Knee surgery Bilateral 1998    x2  . Heart murmer  2014  . Colonoscopy N/A 06/24/2015    Procedure: COLONOSCOPY;  Surgeon: Gatha Mayer, MD;  Location: [facility];  Service: Endoscopy;  Laterality: N/A;   Family History  Problem Relation Age of Onset  . Kidney disease Mother   . Stroke Father   . Colon cancer Father 47  . Kidney disease Sister   . Cancer Paternal Aunt     COLON CA  . Colon cancer Paternal Aunt 36   Social History  Substance Use Topics  . Smoking status: Never Smoker   . Smokeless tobacco: Never Used  . Alcohol Use: No   Current Outpatient Prescriptions  Medication Sig Dispense Refill  . Ascorbic Acid (VITAMIN  C) 1000 MG tablet Take 1,000 mg by mouth daily.      Marland Kitchen aspirin 81 MG EC tablet Take 1 tablet (81 mg total) by mouth daily. DO NOT TAKE AGAIN UNTIL 07/11/2015 30 tablet 12  . Calcium Carbonate-Vitamin D (CALCIUM-VITAMIN D) 500-200 MG-UNIT per tablet Take 1 tablet by mouth daily. Take 400 units daily     . cholecalciferol (VITAMIN D) 1000 UNITS tablet Take 1,000 Units by mouth daily.    . colchicine 0.6 MG tablet Take 0.6 mg by mouth as needed. Reported on 06/23/2015    . conjugated estrogens (PREMARIN) vaginal cream Place 1 Applicatorful vaginally daily.    . cyclobenzaprine (FLEXERIL) 10 MG tablet Take 10 mg by mouth as needed for muscle spasms. Reported on 06/23/2015    . Dextromethorphan-Guaifenesin  (MUCUS-DM MAX) 60-1200 MG TB12 Take by mouth.    . esomeprazole (NEXIUM) 40 MG capsule Take 1 capsule (40 mg total) by mouth 2 (two) times daily. 60 capsule 11  . ferrous sulfate 325 (65 FE) MG tablet Take 325 mg by mouth 2 (two) times daily with a meal.    . hydrochlorothiazide (HYDRODIURIL) 25 MG tablet Take 25 mg by mouth daily. Reported on 06/23/2015    . HYDROcodone-acetaminophen (NORCO/VICODIN) 5-325 MG tablet Take 1 tablet by mouth as needed for moderate pain. Reported on 06/23/2015    . ibuprofen (ADVIL,MOTRIN) 600 MG tablet Take 1 tablet (600 mg total) by mouth as needed. DO NOT USE AGAIN UNTIL 07/11/2015 30 tablet 0  . lubiprostone (AMITIZA) 24 MCG capsule TAKE 1 CAPSULE BY MOUTH TWICE A DAY. 60 capsule 11  . meloxicam (MOBIC) 7.5 MG tablet TAKE 1 TABLET BY MOUTH DAILY WITH FOOD.  DO NOT TAKE AGAIN UNTIL 07/11/2015 30 tablet 11  . Misc Natural Products (OSTEO BI-FLEX JOINT SHIELD PO) Take by mouth.    . mometasone (NASONEX) 50 MCG/ACT nasal spray Place 2 sprays into the nose daily. 17 g 11  . Multiple Vitamin (MULTIVITAMIN) tablet Take 1 tablet by mouth daily.      . potassium chloride (K-DUR) 10 MEQ tablet Take 2 tablets (20 mEq total) by mouth 2 (two) times daily. 360 tablet 3  . sennosides-docusate sodium (SENOKOT-S) 8.6-50 MG tablet Take 1 tablet by mouth daily.    . Simethicone (PHAZYME PO) Take by mouth.    . venlafaxine (EFFEXOR) 75 MG tablet Take 75 mg by mouth 2 (two) times daily.     No current facility-administered medications for this visit.   Allergies  Allergen Reactions  . Clarithromycin     REACTION: throat swells  . Codeine     REACTION: nausea and vomiting  . Paroxetine     REACTION: doesn't work  . Penicillins     REACTION: whelps     Review of Systems: Gen: Denies any fever, chills, sweats, anorexia, fatigue, weakness, malaise, weight loss, and sleep disorder CV: Denies chest pain, angina, palpitations, syncope, orthopnea, PND, peripheral edema, and  claudication. Resp: Denies dyspnea at rest, dyspnea with exercise, cough, sputum, wheezing, coughing up blood, and pleurisy. GI: Denies vomiting blood, jaundice, and fecal incontinence.   Denies dysphagia or odynophagia. GU : Denies urinary burning, blood in urine, urinary frequency, urinary hesitancy, nocturnal urination, and urinary incontinence. MS: Denies joint pain, limitation of movement, and swelling, stiffness, low back pain, extremity pain. Denies muscle weakness, cramps, atrophy.  Derm: Denies rash, itching, dry skin, hives, moles, warts, or unhealing ulcers.  Psych: Denies depression, anxiety, memory loss, suicidal ideation, hallucinations, paranoia, and  confusion. Heme: Denies bruising, bleeding, and enlarged lymph nodes. Neuro:  Denies any headaches, dizziness, paresthesia Endo:  Denies any problems with DM, thyroid, adrenal  LAB RESULTS:  Recent Labs  07/19/15 1443  WBC 5.6  HGB 12.1  HCT 35.8*  PLT 252.0     Physical Exam: BP 122/64 mmHg  Pulse 60  Ht 5\' 4"  (1.626 m)  Wt 187 lb (84.823 kg)  BMI 32.08 kg/m2 General: Pleasant, well developed , female in no acute distress Head: Normocephalic and atraumatic Eyes:  sclerae anicteric, conjunctiva pink  Ears: Normal auditory acuity Lungs: Clear throughout to auscultation Heart: Regular rate and rhythm Abdomen: Soft, non distended, non-tender. No masses, no hepatomegaly. Normal bowel sounds Musculoskeletal: Symmetrical with no gross deformities  Extremities: No edema  Neurological: Alert oriented x 4, grossly nonfocal Psychological:  Alert and cooperative. Normal mood and affect  Assessment and Recommendations:  -year-old female with a history of adenomatous colon polyps with high-grade dysplasia, status post colonoscopy in 06/23/2015 with post-polypectomy bleed. Repeat colonoscopy in December 16 with clipping of ulcer at polypectomy site. Patient was started on iron. Hemoglobin now normal and MCV normal. Patient has  been advised that she may stop oral iron supplement. A repeat CBC will be obtained in 1 month. She is on the recall list for repeat colonoscopy in 2 years and we'll follow up on an as-needed basis.       Oaklyn Mans, Deloris Ping 07/21/2015,

## 2015-07-27 ENCOUNTER — Telehealth: Payer: Self-pay | Admitting: Family Medicine

## 2015-07-27 ENCOUNTER — Ambulatory Visit (INDEPENDENT_AMBULATORY_CARE_PROVIDER_SITE_OTHER): Payer: PPO | Admitting: Family Medicine

## 2015-07-27 ENCOUNTER — Encounter: Payer: Self-pay | Admitting: Family Medicine

## 2015-07-27 VITALS — BP 128/68 | HR 76 | Temp 98.3°F | Ht 64.0 in | Wt 187.0 lb

## 2015-07-27 DIAGNOSIS — R42 Dizziness and giddiness: Secondary | ICD-10-CM

## 2015-07-27 DIAGNOSIS — J019 Acute sinusitis, unspecified: Secondary | ICD-10-CM | POA: Insufficient documentation

## 2015-07-27 DIAGNOSIS — J0100 Acute maxillary sinusitis, unspecified: Secondary | ICD-10-CM | POA: Diagnosis not present

## 2015-07-27 MED ORDER — MECLIZINE HCL 25 MG PO TABS: 25.0000 mg | ORAL_TABLET | Freq: Three times a day (TID) | ORAL | Status: DC | PRN

## 2015-07-27 MED ORDER — LEVOFLOXACIN 500 MG PO TABS: 500.0000 mg | ORAL_TABLET | Freq: Every day | ORAL | Status: DC

## 2015-07-27 NOTE — Assessment & Plan Note (Signed)
Suspect triggered by ETD /sinusitis  tx with meclizine 25 mg tid as tolerated (with sedation)-for 2-3 days in a row and then prn (this has worked in the past) Urged to move head and position slowly  Update if not starting to improve in a week or if worsening

## 2015-07-27 NOTE — Telephone Encounter (Signed)
Pt has appt 07/27/15 at 12 noon with Dr Glori Bickers.

## 2015-07-27 NOTE — Telephone Encounter (Signed)
Patient Name: [facility]  DOB: 1946-06-09    Initial Comment Caller states she is having dizziness and nausea, usually only when getting up but this morning is every time she moves her head   Nurse Assessment      Guidelines    Guideline Title Affirmed Question Affirmed Notes  Dizziness - Vertigo [1] MILD dizziness (e.g., vertigo; walking normally) AND [2] has NOT been evaluated by physician for this    Final Disposition User   See PCP When Office is Open (within 3 days) Mallie Mussel, RN, [facility] Heimlich    Comments  Dizziness is not interfering with ADL, she has already been up and fixed breakfast for herself.  Caller requested to be seen today. Appointment scheduled for today at 12:00 with Dr. Loura Pardon.   Referrals  REFERRED TO PCP OFFICE   Disagree/Comply: Comply

## 2015-07-27 NOTE — Assessment & Plan Note (Signed)
Maxillary worse on R  Adding to her vertigo Cover with augmentin Disc symptomatic care - see instructions on AVS  Update if not starting to improve in a week or if worsening

## 2015-07-27 NOTE — Telephone Encounter (Signed)
Will see her then 

## 2015-07-27 NOTE — Progress Notes (Signed)
Subjective:    Patient ID: Veronica Greene, female    DOB: 1945/10/18, 70 y.o.   MRN: [id number]  HPI Here for vertigo symptoms   Started Sunday am when she sit up in bed  Felt like the room was spinning  When she changes position or bends over it becomes worse   Took left over meclizine 1/2 pill  Before bed took a whole pill Was worse on Monday am - got nauseated / dry heaves  So she took the medicine through the day Better on Tuesday am   Just took one pill last night   Has had some ear pressure (occ pain) Ears tend to drain a little bit  Wears hearing aides that make them itch  Sinus pressure  She takes saline/flonase and mucinex   Not blowing a lot but congested  Has sinus tenderness  Worse on the R side more than the left   No fever  Tuesday her cheeks were very red   Patient Active Problem List   Diagnosis Date Noted  . Melena   . BPV (benign positional vertigo) 06/18/2014  . Sinus congestion 06/18/2014  . Encounter for Medicare annual wellness exam 09/30/2013  . Cough 08/24/2013  . Mid back pain on left side 06/01/2013  . Back pain 02/16/2013  . Contact dermatitis 10/30/2012  . Hyperlipidemia, mild 09/02/2012  . Other screening mammogram 12/12/2011  . Screening for lipoid disorders 08/31/2011  . Degenerative disk disease 08/31/2011  . Hypokalemia 01/02/2011  . Personal history of colonic polyps 11/03/2010  . Constipation, slow transit 11/03/2010  . Benign neoplasm of colon 11/03/2010  . Melanosis coli 11/03/2010  . Gout 08/25/2010  . DERMATITIS, SEBORRHEIC 08/25/2010  . Osteopenia 09/05/2007  . History of poliomyelitis 08/26/2007  . Obesity 08/26/2007  . ANXIETY 08/26/2007  . DEPRESSION 08/26/2007  . ALLERGIC RHINITIS 08/26/2007  . GERD 08/26/2007  . IBS 08/26/2007  . OVERACTIVE BLADDER 08/26/2007  . OSTEOARTHRITIS, HANDS, BILATERAL 08/26/2007  . FIBROMYALGIA 08/26/2007  . DIZZINESS OR VERTIGO 08/26/2007  . EDEMA, CHRONIC 08/26/2007  . URINARY  INCONTINENCE 08/26/2007  . HEMATURIA, HX OF 08/26/2007   Past Medical History  Diagnosis Date  . Allergic rhinitis   . GERD (gastroesophageal reflux disease)   . Hypertension   . Depression with anxiety   . Urinary incontinence   . Osteopenia   . Cataracts, bilateral     early cataracts  . Gout   . Sinusitis, chronic   . IBS (irritable bowel syndrome)     with constipation  . Arthritis   . Heart murmur 2013  . Polio age 6 months    right leg weakness and 1.5 inches shorter than left leg.    Past Surgical History  Procedure Laterality Date  . Hand surgery Bilateral 1999  . Carpal tunnel release Bilateral 1986, 1993  . Thumb arthroscopy  1999  . Tonsillectomy  7274    70 years old  . Colonoscopy  11/09/2011  . Polio surgery.    . Ankle surgery Right 1956  . Colonoscopy w/ polypectomy  10/21/2009  . Nasal sinus surgery  1976  . Knee surgery Bilateral 1998    x2  . Heart murmer  2014  . Colonoscopy N/A 06/24/2015    Procedure: COLONOSCOPY;  Surgeon: Gatha Mayer, MD;  Location: [facility];  Service: Endoscopy;  Laterality: N/A;   Social History  Substance Use Topics  . Smoking status: Never Smoker   . Smokeless tobacco: Never Used  .  Alcohol Use: No   Family History  Problem Relation Age of Onset  . Kidney disease Mother   . Stroke Father   . Colon cancer Father 57  . Kidney disease Sister   . Cancer Paternal Aunt     COLON CA  . Colon cancer Paternal Aunt 45   Allergies  Allergen Reactions  . Clarithromycin     REACTION: throat swells  . Codeine     REACTION: nausea and vomiting  . Paroxetine     REACTION: doesn't work  . Penicillins     REACTION: whelps   Current Outpatient Prescriptions on File Prior to Visit  Medication Sig Dispense Refill  . Ascorbic Acid (VITAMIN C) 1000 MG tablet Take 1,000 mg by mouth daily.      Marland Kitchen aspirin 81 MG EC tablet Take 1 tablet (81 mg total) by mouth daily. DO NOT TAKE AGAIN UNTIL 07/11/2015 30 tablet 12  . Calcium  Carbonate-Vitamin D (CALCIUM-VITAMIN D) 500-200 MG-UNIT per tablet Take 1 tablet by mouth daily. Take 400 units daily     . cholecalciferol (VITAMIN D) 1000 UNITS tablet Take 1,000 Units by mouth daily.    . colchicine 0.6 MG tablet Take 0.6 mg by mouth as needed. Reported on 06/23/2015    . conjugated estrogens (PREMARIN) vaginal cream Place 1 Applicatorful vaginally daily.    . cyclobenzaprine (FLEXERIL) 10 MG tablet Take 10 mg by mouth as needed for muscle spasms. Reported on 06/23/2015    . Dextromethorphan-Guaifenesin (MUCUS-DM MAX) 60-1200 MG TB12 Take by mouth.    . esomeprazole (NEXIUM) 40 MG capsule Take 1 capsule (40 mg total) by mouth 2 (two) times daily. 60 capsule 11  . hydrochlorothiazide (HYDRODIURIL) 25 MG tablet Take 25 mg by mouth daily. Reported on 06/23/2015    . HYDROcodone-acetaminophen (NORCO/VICODIN) 5-325 MG tablet Take 1 tablet by mouth as needed for moderate pain. Reported on 06/23/2015    . ibuprofen (ADVIL,MOTRIN) 600 MG tablet Take 1 tablet (600 mg total) by mouth as needed. DO NOT USE AGAIN UNTIL 07/11/2015 30 tablet 0  . lubiprostone (AMITIZA) 24 MCG capsule TAKE 1 CAPSULE BY MOUTH TWICE A DAY. 60 capsule 11  . meloxicam (MOBIC) 7.5 MG tablet TAKE 1 TABLET BY MOUTH DAILY WITH FOOD.  DO NOT TAKE AGAIN UNTIL 07/11/2015 30 tablet 11  . Misc Natural Products (OSTEO BI-FLEX JOINT SHIELD PO) Take by mouth.    . mometasone (NASONEX) 50 MCG/ACT nasal spray Place 2 sprays into the nose daily. 17 g 11  . Multiple Vitamin (MULTIVITAMIN) tablet Take 1 tablet by mouth daily.      . potassium chloride (K-DUR) 10 MEQ tablet Take 2 tablets (20 mEq total) by mouth 2 (two) times daily. 360 tablet 3  . sennosides-docusate sodium (SENOKOT-S) 8.6-50 MG tablet Take 1 tablet by mouth daily.    . Simethicone (PHAZYME PO) Take by mouth.    . venlafaxine (EFFEXOR) 75 MG tablet Take 75 mg by mouth 2 (two) times daily.     No current facility-administered medications on file prior to visit.      Review of Systems  Constitutional: Positive for appetite change. Negative for fever and fatigue.  HENT: Positive for congestion, ear pain, postnasal drip, rhinorrhea, sinus pressure and sore throat. Negative for nosebleeds.   Eyes: Negative for pain, redness and itching.  Respiratory: Positive for cough. Negative for shortness of breath and wheezing.   Cardiovascular: Negative for chest pain.  Gastrointestinal: Positive for nausea. Negative for vomiting, abdominal pain  and diarrhea.  Endocrine: Negative for polyuria.  Genitourinary: Negative for dysuria, urgency and frequency.  Musculoskeletal: Negative for myalgias and arthralgias.  Allergic/Immunologic: Negative for immunocompromised state.  Neurological: Positive for dizziness and headaches. Negative for tremors, seizures, syncope, facial asymmetry, speech difficulty, weakness and numbness.  Hematological: Negative for adenopathy. Does not bruise/bleed easily.  Psychiatric/Behavioral: Negative for dysphoric mood. The patient is not nervous/anxious.        Objective:   Physical Exam  Constitutional: She is oriented to person, place, and time. She appears well-developed and well-nourished. No distress.  obese and well appearing   HENT:  Head: Normocephalic and atraumatic.  Right Ear: External ear normal.  Left Ear: External ear normal.  Nose: Nose normal.  Mouth/Throat: Oropharynx is clear and moist. No oropharyngeal exudate.  Nares are injected and congested  Bilateral maxillary sinus tenderness -worse on R Post nasal drip   Eyes: Conjunctivae and EOM are normal. Pupils are equal, round, and reactive to light. Right eye exhibits no discharge. Left eye exhibits no discharge. No scleral icterus.   Nystagmus 3-4 beats horizontal bilat  Neck: Normal range of motion and full passive range of motion without pain. Neck supple. No JVD present. Carotid bruit is not present. No tracheal deviation present. No thyromegaly present.   Cardiovascular: Normal rate, regular rhythm and normal heart sounds.   No murmur heard. Pulmonary/Chest: Effort normal and breath sounds normal. No respiratory distress. She has no wheezes. She has no rales.  Abdominal: Soft. Bowel sounds are normal. She exhibits no distension and no mass. There is no tenderness.  Musculoskeletal: She exhibits no edema or tenderness.  Lymphadenopathy:    She has no cervical adenopathy.  Neurological: She is alert and oriented to person, place, and time. She has normal strength and normal reflexes. She displays no atrophy and no tremor. No cranial nerve deficit or sensory deficit. She exhibits normal muscle tone. She displays a negative Romberg sign. Coordination and gait normal.  No focal cerebellar signs   Skin: Skin is warm and dry. No rash noted. No pallor.  Psychiatric: She has a normal mood and affect. Her behavior is normal. Thought content normal.          Assessment & Plan:   Problem List Items Addressed This Visit      Respiratory   Acute sinusitis - Primary    Maxillary worse on R  Adding to her vertigo Cover with augmentin Disc symptomatic care - see instructions on AVS  Update if not starting to improve in a week or if worsening        Relevant Medications   levofloxacin (LEVAQUIN) 500 MG tablet     Other   DIZZINESS OR VERTIGO    Suspect triggered by ETD /sinusitis  tx with meclizine 25 mg tid as tolerated (with sedation)-for 2-3 days in a row and then prn (this has worked in the past) Urged to move head and position slowly  Update if not starting to improve in a week or if worsening          Relevant Medications   meclizine (ANTIVERT) 25 MG tablet

## 2015-07-27 NOTE — Progress Notes (Signed)
Pre visit review using our clinic review tool, if applicable. No additional management support is needed unless otherwise documented below in the visit note. 

## 2015-07-27 NOTE — Patient Instructions (Signed)
If the mucinex helps congestion- use it (drink lots of water) Continue your nasal saline and steroid nasal spray  Take levaquin for a sinus infection   Continue meclizine three times daily for at least 2-3 days then as needed  Move slowly - especially when turning   Don't drive if too dizzy or sleepy   Update if not starting to improve in a week or if worsening

## 2015-08-03 ENCOUNTER — Ambulatory Visit (INDEPENDENT_AMBULATORY_CARE_PROVIDER_SITE_OTHER): Payer: PPO

## 2015-08-03 ENCOUNTER — Ambulatory Visit (INDEPENDENT_AMBULATORY_CARE_PROVIDER_SITE_OTHER): Payer: PPO | Admitting: Podiatry

## 2015-08-03 ENCOUNTER — Encounter: Payer: Self-pay | Admitting: Podiatry

## 2015-08-03 VITALS — BP 137/63 | HR 80 | Resp 16

## 2015-08-03 DIAGNOSIS — M79672 Pain in left foot: Secondary | ICD-10-CM

## 2015-08-03 DIAGNOSIS — M2042 Other hammer toe(s) (acquired), left foot: Secondary | ICD-10-CM | POA: Diagnosis not present

## 2015-08-03 DIAGNOSIS — M779 Enthesopathy, unspecified: Secondary | ICD-10-CM | POA: Diagnosis not present

## 2015-08-03 MED ORDER — TRIAMCINOLONE ACETONIDE 10 MG/ML IJ SUSP
10.0000 mg | Freq: Once | INTRAMUSCULAR | Status: AC
  Administered 2015-08-03: 10 mg

## 2015-08-04 NOTE — Progress Notes (Signed)
Subjective:     Patient ID: Veronica Greene, female   DOB: 26-Sep-1945, 70 y.o.   MRN: [id number]  HPI patient has developed pain in the second and third metatarsophalangeal joints left and states the toes of movement recently and they have lifted somewhat. States that it's been quite tender   Review of Systems     Objective:   Physical Exam  neurovascular status unchanged with inflammatory changes of the second and third metatarsophalangeal joint left with fluid buildup and pain and what possibly may be flexor plate stretch or dislocation    Assessment:      H&P and x-rays reviewed of both condition. At this point I recommended capsular injections of both joints and I did proximal nerve block 120 mg Xylocaine Marcaine mixture aspirated the second and third metatarsal phalangeal joint getting out clear fluid and injected each joint with a quarter cc deck some some Kenalog and applied thick plantar pad to take pressure off the joint surfaces. I reviewed the x-rays prior to procedures    Plan:      inflammatory capsulitis with possible flexor plate dislocation was treated today

## 2015-08-17 ENCOUNTER — Ambulatory Visit (INDEPENDENT_AMBULATORY_CARE_PROVIDER_SITE_OTHER): Payer: PPO | Admitting: Podiatry

## 2015-08-17 ENCOUNTER — Encounter: Payer: Self-pay | Admitting: Podiatry

## 2015-08-17 VITALS — BP 141/68 | HR 78 | Resp 16

## 2015-08-17 DIAGNOSIS — M2042 Other hammer toe(s) (acquired), left foot: Secondary | ICD-10-CM

## 2015-08-17 DIAGNOSIS — M779 Enthesopathy, unspecified: Secondary | ICD-10-CM

## 2015-08-17 NOTE — Progress Notes (Signed)
Subjective:     Patient ID: Veronica Greene, female   DOB: [birth year], 70 y.o.   MRN: [id number]  HPI patient presents stating I'm having a lot of pain in my forefoot left that has improved when he gave me the medication and padding but I'm worried it's can come back   Review of Systems     Objective:   Physical Exam  neurovascular status intact with inflammation around the second and third metatarsophalangeal joint left that has moderated but is still present    Assessment:      inflammatory capsulitis second third MPJ left that's improved but present    Plan:      reviewed condition and utilize padding therapy and discussed possibility long-term for orthotics. Patient be seen back for [place] to re- evaluate if symptoms persist or get worse and I dispensed metatarsal pads today

## 2015-08-30 ENCOUNTER — Other Ambulatory Visit (INDEPENDENT_AMBULATORY_CARE_PROVIDER_SITE_OTHER): Payer: PPO

## 2015-08-30 DIAGNOSIS — Z860100 Personal history of colon polyps, unspecified: Secondary | ICD-10-CM

## 2015-08-30 DIAGNOSIS — D500 Iron deficiency anemia secondary to blood loss (chronic): Secondary | ICD-10-CM | POA: Diagnosis not present

## 2015-08-30 LAB — CBC WITH DIFFERENTIAL/PLATELET
BASOS PCT: 0.2 % (ref 0.0–3.0)
Basophils Absolute: 0 10*3/uL (ref 0.0–0.1)
EOS ABS: 0.2 10*3/uL (ref 0.0–0.7)
Eosinophils Relative: 2.7 % (ref 0.0–5.0)
HCT: 39 % (ref 36.0–46.0)
HEMOGLOBIN: 13.4 g/dL (ref 12.0–15.0)
Lymphocytes Relative: 29.1 % (ref 12.0–46.0)
Lymphs Abs: 1.7 10*3/uL (ref 0.7–4.0)
MCHC: 34.3 g/dL (ref 30.0–36.0)
MCV: 86.9 fl (ref 78.0–100.0)
MONO ABS: 0.2 10*3/uL (ref 0.1–1.0)
Monocytes Relative: 4.2 % (ref 3.0–12.0)
Neutro Abs: 3.8 10*3/uL (ref 1.4–7.7)
Neutrophils Relative %: 63.8 % (ref 43.0–77.0)
Platelets: 235 10*3/uL (ref 150.0–400.0)
RBC: 4.49 Mil/uL (ref 3.87–5.11)
RDW: 13 % (ref 11.5–15.5)
WBC: 5.9 10*3/uL (ref 4.0–10.5)

## 2015-09-09 ENCOUNTER — Ambulatory Visit: Payer: Self-pay

## 2015-09-09 ENCOUNTER — Ambulatory Visit (INDEPENDENT_AMBULATORY_CARE_PROVIDER_SITE_OTHER): Payer: PPO | Admitting: Podiatry

## 2015-09-09 ENCOUNTER — Encounter: Payer: Self-pay | Admitting: Podiatry

## 2015-09-09 VITALS — BP 138/77 | HR 77 | Resp 14

## 2015-09-09 DIAGNOSIS — M722 Plantar fascial fibromatosis: Secondary | ICD-10-CM

## 2015-09-09 DIAGNOSIS — R52 Pain, unspecified: Secondary | ICD-10-CM

## 2015-09-09 MED ORDER — MELOXICAM 7.5 MG PO TABS: 7.5000 mg | ORAL_TABLET | Freq: Every day | ORAL | Status: DC

## 2015-09-09 NOTE — Progress Notes (Signed)
Subjective:     Patient ID: Veronica Greene, female   DOB: 1945-11-04, 70 y.o.   MRN: [id number]  HPI this patient presents the office with chief complaint of a painful heel on her left foot. He states that heel is extremely painful and sore as she stands from a sitting position and in the a.m. upon rising. She says her pain is at a level of 10. She also has a history of forefoot pain in her left foot, which was treated by Dr. Paulla Dolly with injection therapy. She says that she has been guarding her left foot and putting more weight on her forefoot to keep pressure off her heel. This problem has been going on for days and she presents the office today for an evaluation and treatment of this condition. Patient has history of childhood polio.   Review of Systems     Objective:   Physical Exam GENERAL APPEARANCE: Alert, conversant. Appropriately groomed. No acute distress.  VASCULAR: Pedal pulses palpable at  [facility] and PT bilateral.  Capillary refill time is immediate to all digits,  Normal temperature gradient.  Digital hair growth is present bilateral  NEUROLOGIC: sensation is normal to 5.07 monofilament at 5/5 sites bilateral.  Light touch is intact bilateral, Muscle strength normal.  MUSCULOSKELETAL: acceptable muscle strength, tone and stability bilateral.  Intrinsic muscluature intact bilateral.  Rectus appearance of foot and digits noted bilateral. Palpable pain at the insertion plantar fascia left foot.  There is also palpable pain mid-arch.  Persistent palpable pain second interspace left foot.  DERMATOLOGIC: skin color, texture, and turgor are within normal limits.  No preulcerative lesions or ulcers  are seen, no interdigital maceration noted.  No open lesions present.  Digital nails are asymptomatic. No drainage noted.      Assessment:     Plantar fascitis left foot.     Plan:     ROV  Injection therapy left foot.  Added dispersion pad to her orthoses.  RTC 2 weeks.   Gardiner Barefoot DPM

## 2015-09-12 ENCOUNTER — Ambulatory Visit: Payer: PPO | Admitting: Podiatry

## 2015-09-13 ENCOUNTER — Ambulatory Visit: Payer: PPO | Admitting: Podiatry

## 2015-09-22 ENCOUNTER — Ambulatory Visit (INDEPENDENT_AMBULATORY_CARE_PROVIDER_SITE_OTHER): Payer: PPO | Admitting: Podiatry

## 2015-09-22 DIAGNOSIS — M722 Plantar fascial fibromatosis: Secondary | ICD-10-CM

## 2015-09-22 DIAGNOSIS — M779 Enthesopathy, unspecified: Secondary | ICD-10-CM

## 2015-09-22 NOTE — Patient Instructions (Addendum)

## 2015-09-27 NOTE — Progress Notes (Signed)
Subjective:     Patient ID: Veronica Greene, female   DOB: [birth year], 70 y.o.   MRN: [id number]  HPI patient states it seems to be improving but I still have pain and at this point the pads really seem to help me   Review of Systems     Objective:   Physical Exam Neurovascular status intact muscle strength adequate with continued discomfort in the lesser MPJs left of a moderate nature    Assessment:     Inflammatory capsulitis of the left forefoot secondary to structure with improvement of pain but still present    Plan:     Reviewed conditions and the continuation of supportive shoes. Dispensed metatarsal pads to reduce stress and patient may require orthotics and will be seen back as needed

## 2015-10-12 ENCOUNTER — Ambulatory Visit (INDEPENDENT_AMBULATORY_CARE_PROVIDER_SITE_OTHER): Payer: PPO | Admitting: Primary Care

## 2015-10-12 ENCOUNTER — Encounter: Payer: Self-pay | Admitting: Primary Care

## 2015-10-12 ENCOUNTER — Other Ambulatory Visit: Payer: PPO

## 2015-10-12 ENCOUNTER — Ambulatory Visit: Payer: PPO | Admitting: Family Medicine

## 2015-10-12 VITALS — BP 126/70 | HR 75 | Temp 97.9°F | Ht 64.0 in | Wt 188.8 lb

## 2015-10-12 DIAGNOSIS — L237 Allergic contact dermatitis due to plants, except food: Secondary | ICD-10-CM

## 2015-10-12 MED ORDER — TRIAMCINOLONE ACETONIDE 0.025 % EX CREA: 1.0000 "application " | TOPICAL_CREAM | Freq: Two times a day (BID) | CUTANEOUS | Status: DC

## 2015-10-12 MED ORDER — PREDNISONE 10 MG PO TABS: ORAL_TABLET | ORAL | Status: DC

## 2015-10-12 NOTE — Progress Notes (Signed)
Pre visit review using our clinic review tool, if applicable. No additional management support is needed unless otherwise documented below in the visit note. 

## 2015-10-12 NOTE — Patient Instructions (Signed)
Start Prednisone tablets for [facility]. Take 3 tablets for 3 days, then 2 tablets for 3 days, then 1 tablet for 3 days.  You may apply the Triamcinolone cream twice daily as needed for itching.  It was a pleasure to see you today!  Poison [employer] ivy is a inflammation of the skin (contact dermatitis) caused by touching the allergens on the leaves of the ivy plant following previous exposure to the plant. The rash usually appears 48 hours after exposure. The rash is usually bumps (papules) or blisters (vesicles) in a linear pattern. Depending on your own sensitivity, the rash may simply cause redness and itching, or it may also progress to blisters which may break open. These must be well cared for to prevent secondary bacterial (germ) infection, followed by scarring. Keep any open areas dry, clean, dressed, and covered with an antibacterial ointment if needed. The eyes may also get puffy. The puffiness is worst in the morning and gets better as the day progresses. This dermatitis usually heals without scarring, within 2 to 3 weeks without treatment. HOME CARE INSTRUCTIONS  Thoroughly wash with soap and water as soon as you have been exposed to poison ivy. You have about one half hour to remove the plant resin before it will cause the rash. This washing will destroy the oil or antigen on the skin that is causing, or will cause, the rash. Be sure to wash under your fingernails as any plant resin there will continue to spread the rash. Do not rub skin vigorously when washing affected area. Poison ivy cannot spread if no oil from the plant remains on your body. A rash that has progressed to weeping sores will not spread the rash unless you have not washed thoroughly. It is also important to wash any clothes you have been wearing as these may carry active allergens. The rash will return if you wear the unwashed clothing, even several days later. Avoidance of the plant in the future is the best measure.  Poison ivy plant can be recognized by the number of leaves. Generally, poison ivy has three leaves with flowering branches on a single stem. Diphenhydramine may be purchased over the counter and used as needed for itching. Do not drive with this medication if it makes you drowsy.Ask your caregiver about medication for children. SEEK MEDICAL CARE IF:  Open sores develop.  Redness spreads beyond area of rash.  You notice purulent (pus-like) discharge.  You have increased pain.  Other signs of infection develop (such as fever).   This information is not intended to replace advice given to you by your health care provider. Make sure you discuss any questions you have with your health care provider.   Document Released: 06/22/2000 Document Revised: 09/17/2011 Document Reviewed: 12/01/2014 Elsevier Interactive Patient Education [employer].

## 2015-10-12 NOTE — Progress Notes (Signed)
Subjective:    Patient ID: Veronica Greene, female    DOB: [birth year], 70 y.o.   MRN: [id number]  HPI  [name] is a 70 year old female who presents today with a chief complaint of rash. She was working in her yard moving pine needles Saturday last weekend and woke up Saturday night with itching and this rash. Her rash is located to her right lower extremity and right anterior wrist. She thought she had a pine needle stuck into her right lower leg so she sterilized a needed and picked at her leg. She believes her rash is getting worse.   She's used calamine lotion and OTC cortisone cream with temporary improvement in itching. Denies fevers, fatigue, increased redness, drainage to her rash. Her fiance has the same symptoms that began Saturday night after working along side of her in the yard.  Review of Systems  Constitutional: Negative for fever.  Respiratory: Negative for shortness of breath.   Musculoskeletal: Negative for myalgias.  Skin: Positive for rash.       Past Medical History  Diagnosis Date  . Allergic rhinitis   . GERD (gastroesophageal reflux disease)   . Hypertension   . Depression with anxiety   . Urinary incontinence   . Osteopenia   . Cataracts, bilateral     early cataracts  . Gout   . Sinusitis, chronic   . IBS (irritable bowel syndrome)     with constipation  . Arthritis   . Heart murmur 2013  . Polio age 52 months    right leg weakness and 1.5 inches shorter than left leg.     Social History   Social History  . Marital Status: Widowed    Spouse Name: N/A  . Number of Children: 1  . Years of Education: N/A   Occupational History  . [employer]    Social History Main Topics  . Smoking status: Never Smoker   . Smokeless tobacco: Never Used  . Alcohol Use: No  . Drug Use: No  . Sexual Activity: Not on file   Other Topics Concern  . Not on file   Social History Narrative    Past Surgical History  Procedure Laterality Date  . Hand  surgery Bilateral 1999  . Carpal tunnel release Bilateral 1986, 1993  . Thumb arthroscopy  1999  . Tonsillectomy  7219    70 years old  . Colonoscopy  11/09/2011  . Polio surgery.    . Ankle surgery Right 1956  . Colonoscopy w/ polypectomy  10/21/2009  . Nasal sinus surgery  1976  . Knee surgery Bilateral 1998    x2  . Heart murmer  2014  . Colonoscopy N/A 06/24/2015    Procedure: COLONOSCOPY;  Surgeon: Gatha Mayer, MD;  Location: [facility];  Service: Endoscopy;  Laterality: N/A;    Family History  Problem Relation Age of Onset  . Kidney disease Mother   . Stroke Father   . Colon cancer Father 90  . Kidney disease Sister   . Cancer Paternal Aunt     COLON CA  . Colon cancer Paternal Aunt 49    Allergies  Allergen Reactions  . Clarithromycin     REACTION: throat swells  . Codeine     REACTION: nausea and vomiting  . Paroxetine     REACTION: doesn't work  . Penicillins     REACTION: whelps    Current Outpatient Prescriptions on File Prior to Visit  Medication Sig Dispense  Refill  . Ascorbic Acid (VITAMIN C) 1000 MG tablet Take 1,000 mg by mouth daily.      Marland Kitchen aspirin 81 MG EC tablet Take 1 tablet (81 mg total) by mouth daily. DO NOT TAKE AGAIN UNTIL 07/11/2015 30 tablet 12  . Calcium Carbonate-Vitamin D (CALCIUM-VITAMIN D) 500-200 MG-UNIT per tablet Take 1 tablet by mouth daily. Take 400 units daily     . cholecalciferol (VITAMIN D) 1000 UNITS tablet Take 1,000 Units by mouth daily.    . colchicine 0.6 MG tablet Take 0.6 mg by mouth as needed. Reported on 06/23/2015    . conjugated estrogens (PREMARIN) vaginal cream Place 1 Applicatorful vaginally daily.    . cyclobenzaprine (FLEXERIL) 10 MG tablet Take 10 mg by mouth as needed for muscle spasms. Reported on 06/23/2015    . Dextromethorphan-Guaifenesin (MUCUS-DM MAX) 60-1200 MG TB12 Take by mouth.    . esomeprazole (NEXIUM) 40 MG capsule Take 1 capsule (40 mg total) by mouth 2 (two) times daily. 60 capsule 11  .  hydrochlorothiazide (HYDRODIURIL) 25 MG tablet Take 25 mg by mouth daily. Reported on 06/23/2015    . HYDROcodone-acetaminophen (NORCO/VICODIN) 5-325 MG tablet Take 1 tablet by mouth as needed for moderate pain. Reported on 06/23/2015    . ibuprofen (ADVIL,MOTRIN) 600 MG tablet Take 1 tablet (600 mg total) by mouth as needed. DO NOT USE AGAIN UNTIL 07/11/2015 30 tablet 0  . levofloxacin (LEVAQUIN) 500 MG tablet Take 1 tablet (500 mg total) by mouth daily. 7 tablet 0  . lubiprostone (AMITIZA) 24 MCG capsule TAKE 1 CAPSULE BY MOUTH TWICE A DAY. 60 capsule 11  . meclizine (ANTIVERT) 25 MG tablet Take 1 tablet (25 mg total) by mouth 3 (three) times daily as needed for dizziness (caution of sedation). 30 tablet 2  . meloxicam (MOBIC) 7.5 MG tablet Take 1 tablet (7.5 mg total) by mouth daily after breakfast. 30 tablet 0  . Misc Natural Products (OSTEO BI-FLEX JOINT SHIELD PO) Take by mouth.    . mometasone (NASONEX) 50 MCG/ACT nasal spray Place 2 sprays into the nose daily. 17 g 11  . Multiple Vitamin (MULTIVITAMIN) tablet Take 1 tablet by mouth daily.      . potassium chloride (K-DUR) 10 MEQ tablet Take 2 tablets (20 mEq total) by mouth 2 (two) times daily. 360 tablet 3  . sennosides-docusate sodium (SENOKOT-S) 8.6-50 MG tablet Take 1 tablet by mouth daily.    . Simethicone (PHAZYME PO) Take by mouth.    . venlafaxine (EFFEXOR) 75 MG tablet Take 75 mg by mouth 2 (two) times daily.     No current facility-administered medications on file prior to visit.    BP 126/70 mmHg  Pulse 75  Temp(Src) 97.9 F (36.6 C) (Oral)  Ht 5\' 4"  (1.626 m)  Wt 188 lb 12.8 oz (85.639 kg)  BMI 32.39 kg/m2  SpO2 96%    Objective:   Physical Exam  Constitutional: She appears well-nourished. She does not appear ill.  Cardiovascular: Normal rate and regular rhythm.   Pulmonary/Chest: Effort normal and breath sounds normal.  Skin: Skin is warm.  Rash representative of poison ivy dermatitis present to right anterior  wrist, right lateral calf. Mild-moderate. No obvious s/s of infection.          Assessment & Plan:  Poison Ivy Dermatitis:  Itchy rash to right wrist and right lateral calf since working in yard with [employer] last weekend. Exam with rash evident of poison ivy dermatitis. Mild-moderate. Will treat with  low dose prednisone taper and low dose triamcinolone cream as her itching is moderate/severe. No obvious s/s of infection. Home treatment and return precautions provided.

## 2015-10-15 ENCOUNTER — Telehealth: Payer: Self-pay | Admitting: Family Medicine

## 2015-10-15 DIAGNOSIS — Z0001 Encounter for general adult medical examination with abnormal findings: Secondary | ICD-10-CM | POA: Insufficient documentation

## 2015-10-15 DIAGNOSIS — M1A9XX0 Chronic gout, unspecified, without tophus (tophi): Secondary | ICD-10-CM

## 2015-10-15 DIAGNOSIS — E876 Hypokalemia: Secondary | ICD-10-CM

## 2015-10-15 DIAGNOSIS — Z0000 Encounter for general adult medical examination without abnormal findings: Secondary | ICD-10-CM

## 2015-10-15 DIAGNOSIS — E785 Hyperlipidemia, unspecified: Secondary | ICD-10-CM

## 2015-10-15 NOTE — Telephone Encounter (Signed)
-----   Message from Marchia Bond sent at 10/11/2015  8:37 AM EDT ----- Regarding: Cpx labs Tues 4/11, need orders. Thanks! :-) Please order  future cpx labs for pt's upcoming lab appt. Thanks Aniceto Boss

## 2015-10-18 ENCOUNTER — Other Ambulatory Visit (INDEPENDENT_AMBULATORY_CARE_PROVIDER_SITE_OTHER): Payer: PPO

## 2015-10-18 ENCOUNTER — Encounter: Payer: PPO | Admitting: Family Medicine

## 2015-10-18 DIAGNOSIS — E876 Hypokalemia: Secondary | ICD-10-CM

## 2015-10-18 DIAGNOSIS — E785 Hyperlipidemia, unspecified: Secondary | ICD-10-CM | POA: Diagnosis not present

## 2015-10-18 DIAGNOSIS — Z0000 Encounter for general adult medical examination without abnormal findings: Secondary | ICD-10-CM

## 2015-10-18 LAB — COMPREHENSIVE METABOLIC PANEL
ALK PHOS: 53 U/L (ref 39–117)
ALT: 20 U/L (ref 0–35)
AST: 19 U/L (ref 0–37)
Albumin: 4.5 g/dL (ref 3.5–5.2)
BILIRUBIN TOTAL: 0.9 mg/dL (ref 0.2–1.2)
BUN: 18 mg/dL (ref 6–23)
CO2: 31 mEq/L (ref 19–32)
CREATININE: 0.69 mg/dL (ref 0.40–1.20)
Calcium: 9.8 mg/dL (ref 8.4–10.5)
Chloride: 101 mEq/L (ref 96–112)
GFR: 89.23 mL/min (ref >60.00)
GLUCOSE: 89 mg/dL (ref 70–99)
Potassium: 3.7 mEq/L (ref 3.5–5.1)
Sodium: 140 mEq/L (ref 135–145)
TOTAL PROTEIN: 7 g/dL (ref 6.0–8.3)

## 2015-10-18 LAB — CBC WITH DIFFERENTIAL/PLATELET
BASOS ABS: 0 10*3/uL (ref 0.0–0.1)
Basophils Relative: 0.4 % (ref 0.0–3.0)
EOS ABS: 0.1 10*3/uL (ref 0.0–0.7)
Eosinophils Relative: 2.1 % (ref 0.0–5.0)
HCT: 38.9 % (ref 36.0–46.0)
Hemoglobin: 13.4 g/dL (ref 12.0–15.0)
LYMPHS ABS: 2.9 10*3/uL (ref 0.7–4.0)
Lymphocytes Relative: 41.3 % (ref 12.0–46.0)
MCHC: 34.4 g/dL (ref 30.0–36.0)
MCV: 86.7 fl (ref 78.0–100.0)
MONO ABS: 0.4 10*3/uL (ref 0.1–1.0)
MONOS PCT: 5.3 % (ref 3.0–12.0)
NEUTROS PCT: 50.9 % (ref 43.0–77.0)
Neutro Abs: 3.5 10*3/uL (ref 1.4–7.7)
Platelets: 227 10*3/uL (ref 150.0–400.0)
RBC: 4.49 Mil/uL (ref 3.87–5.11)
RDW: 14.1 % (ref 11.5–15.5)
WBC: 6.9 10*3/uL (ref 4.0–10.5)

## 2015-10-18 LAB — LIPID PANEL
CHOLESTEROL: 181 mg/dL (ref 0–200)
HDL: 54.1 mg/dL (ref >39.00)
LDL Cholesterol: 91 mg/dL (ref 0–99)
NonHDL: 127.02
TRIGLYCERIDES: 178 mg/dL — AB (ref 0.0–149.0)
Total CHOL/HDL Ratio: 3
VLDL: 35.6 mg/dL (ref 0.0–40.0)

## 2015-10-18 LAB — TSH: TSH: 2.36 u[IU]/mL (ref 0.35–4.50)

## 2015-10-20 ENCOUNTER — Telehealth: Payer: Self-pay

## 2015-10-20 DIAGNOSIS — L237 Allergic contact dermatitis due to plants, except food: Secondary | ICD-10-CM

## 2015-10-20 MED ORDER — PREDNISONE 20 MG PO TABS: ORAL_TABLET | ORAL | Status: DC

## 2015-10-20 NOTE — Telephone Encounter (Signed)
Will send in an additional supply of prednisone; however, if no improvement, then will need to be seen again in the office.  She will take 2 tablets daily for 3 days, then 1 tablet for 2 days. This is a stronger dose.

## 2015-10-20 NOTE — Telephone Encounter (Signed)
Pt was notified of [facility] recommendations, pt verbalized understanding.

## 2015-10-20 NOTE — Telephone Encounter (Signed)
Pt left /vm; pt was seen 10/12/15; pt finished prednisone on 10/20/15; pt has new breakout of poison ivy on left leg on 10/19/15; does pt need to take more prednisone or what to do? [facility]. Pt request cb.

## 2015-10-25 ENCOUNTER — Ambulatory Visit (INDEPENDENT_AMBULATORY_CARE_PROVIDER_SITE_OTHER): Payer: PPO | Admitting: Family Medicine

## 2015-10-25 ENCOUNTER — Encounter: Payer: Self-pay | Admitting: Family Medicine

## 2015-10-25 VITALS — BP 138/62 | HR 68 | Temp 98.1°F | Ht 65.5 in | Wt 187.5 lb

## 2015-10-25 DIAGNOSIS — M8580 Other specified disorders of bone density and structure, unspecified site: Secondary | ICD-10-CM

## 2015-10-25 DIAGNOSIS — Z0000 Encounter for general adult medical examination without abnormal findings: Secondary | ICD-10-CM | POA: Diagnosis not present

## 2015-10-25 DIAGNOSIS — E785 Hyperlipidemia, unspecified: Secondary | ICD-10-CM

## 2015-10-25 MED ORDER — HYDROCHLOROTHIAZIDE 25 MG PO TABS: 25.0000 mg | ORAL_TABLET | Freq: Every day | ORAL | Status: DC

## 2015-10-25 MED ORDER — VENLAFAXINE HCL 75 MG PO TABS: 75.0000 mg | ORAL_TABLET | Freq: Two times a day (BID) | ORAL | Status: DC

## 2015-10-25 MED ORDER — POTASSIUM CHLORIDE ER 10 MEQ PO TBCR: 20.0000 meq | EXTENDED_RELEASE_TABLET | Freq: Two times a day (BID) | ORAL | Status: DC

## 2015-10-25 MED ORDER — MECLIZINE HCL 25 MG PO TABS: 25.0000 mg | ORAL_TABLET | Freq: Three times a day (TID) | ORAL | Status: DC | PRN

## 2015-10-25 MED ORDER — LUBIPROSTONE 24 MCG PO CAPS: ORAL_CAPSULE | ORAL | Status: DC

## 2015-10-25 NOTE — Patient Instructions (Signed)
Take a look at the sheet I gave you regarding getting a tetanus shot  Stay active and take care of yourself  The staff up front will get you set up for a nurse visit for your medicare portion of exam/questions

## 2015-10-25 NOTE — Assessment & Plan Note (Signed)
Disc goals for lipids and reasons to control them Rev labs with pt Rev low sat fat diet in detail   

## 2015-10-25 NOTE — Assessment & Plan Note (Signed)
Reviewed health habits including diet and exercise and skin cancer prevention Reviewed appropriate screening tests for age  Also reviewed health mt list, fam hx and immunization status , as well as social and family history   See HPI Labs today reviewed  Given handout re: choices for getting Tetanus shot  Will f/u with Katha Cabal for her medicare wellness visit on a different day since she has healthteam adv Enc wt loss and good diet/exercise  Disc fall prev

## 2015-10-25 NOTE — Progress Notes (Signed)
Pre visit review using our clinic review tool, if applicable. No additional management support is needed unless otherwise documented below in the visit note. 

## 2015-10-25 NOTE — Progress Notes (Signed)
Subjective:    Patient ID: Veronica Greene, female    DOB: Dec 02, 1945, 70 y.o.   MRN: [id number]  HPI Here for health maintenance exam and to review chronic medical problems    Doing pretty well overall    Wt is down 1 lb with bmi of 30 (still in obese range)  Trying to take care of herself  Was on prednisone for poison ivy -last day is today  Getting better    Hep C screen- she did that at work when she was working , does not think she has been exposed  Not high risk   Td 4/05-will see if she can get it at a pharmacy   Flu shot 11/16  Mammogram 10/16 Self breast exam-no lumps or changes Still sees a gyn-no gyn problems / she uses the estrace cream once per week - this helps with vaginal atrophy  Colonoscopy 12/16- had polypectomy/ulcers- has settled down since then   Zoster vaccine 2/10  dexa 10/13 in the normal range No fractures  Had a trip and fall in the yard -no injuries (she has post polio syndrome and poor balance so she is extra careful)  Takes her ca and D  Stays active -more now than when she was working      Complete on pneumonia vaccines   Results for orders placed or performed in visit on 10/18/15  Comprehensive metabolic panel  Result Value Ref Range   Sodium 140 135 - 145 mEq/L   Potassium 3.7 3.5 - 5.1 mEq/L   Chloride 101 96 - 112 mEq/L   CO2 31 19 - 32 mEq/L   Glucose, Bld 89 70 - 99 mg/dL   BUN 18 6 - 23 mg/dL   Creatinine, Ser 0.69 0.40 - 1.20 mg/dL   Total Bilirubin 0.9 0.2 - 1.2 mg/dL   Alkaline Phosphatase 53 39 - 117 U/L   AST 19 0 - 37 U/L   ALT 20 0 - 35 U/L   Total Protein 7.0 6.0 - 8.3 g/dL   Albumin 4.5 3.5 - 5.2 g/dL   Calcium 9.8 8.4 - 10.5 mg/dL   GFR 89.23 >60.00 mL/min  CBC with Differential/Platelet  Result Value Ref Range   WBC 6.9 4.0 - 10.5 K/uL   RBC 4.49 3.87 - 5.11 Mil/uL   Hemoglobin 13.4 12.0 - 15.0 g/dL   HCT 38.9 36.0 - 46.0 %   MCV 86.7 78.0 - 100.0 fl   MCHC 34.4 30.0 - 36.0 g/dL   RDW 14.1 11.5 - 15.5  %   Platelets 227.0 150.0 - 400.0 K/uL   Neutrophils Relative % 50.9 43.0 - 77.0 %   Lymphocytes Relative 41.3 12.0 - 46.0 %   Monocytes Relative 5.3 3.0 - 12.0 %   Eosinophils Relative 2.1 0.0 - 5.0 %   Basophils Relative 0.4 0.0 - 3.0 %   Neutro Abs 3.5 1.4 - 7.7 K/uL   Lymphs Abs 2.9 0.7 - 4.0 K/uL   Monocytes Absolute 0.4 0.1 - 1.0 K/uL   Eosinophils Absolute 0.1 0.0 - 0.7 K/uL   Basophils Absolute 0.0 0.0 - 0.1 K/uL  Lipid panel  Result Value Ref Range   Cholesterol 181 0 - 200 mg/dL   Triglycerides 178.0 (H) 0.0 - 149.0 mg/dL   HDL 54.10 >39.00 mg/dL   VLDL 35.6 0.0 - 40.0 mg/dL   LDL Cholesterol 91 0 - 99 mg/dL   Total CHOL/HDL Ratio 3    NonHDL 127.02   TSH  Result  Value Ref Range   TSH 2.36 0.35 - 4.50 uIU/mL     Patient Active Problem List   Diagnosis Date Noted  . Routine general medical examination at a health care facility 10/15/2015  . Melena   . BPV (benign positional vertigo) 06/18/2014  . Sinus congestion 06/18/2014  . Encounter for Medicare annual wellness exam 09/30/2013  . Mid back pain on left side 06/01/2013  . Back pain 02/16/2013  . Contact dermatitis 10/30/2012  . Hyperlipidemia, mild 09/02/2012  . Other screening mammogram 12/12/2011  . Screening for lipoid disorders 08/31/2011  . Degenerative disk disease 08/31/2011  . Hypokalemia 01/02/2011  . Personal history of colonic polyps 11/03/2010  . Constipation, slow transit 11/03/2010  . Benign neoplasm of colon 11/03/2010  . Melanosis coli 11/03/2010  . Gout 08/25/2010  . DERMATITIS, SEBORRHEIC 08/25/2010  . Osteopenia 09/05/2007  . History of poliomyelitis 08/26/2007  . Obesity 08/26/2007  . ANXIETY 08/26/2007  . DEPRESSION 08/26/2007  . ALLERGIC RHINITIS 08/26/2007  . GERD 08/26/2007  . IBS 08/26/2007  . OVERACTIVE BLADDER 08/26/2007  . OSTEOARTHRITIS, HANDS, BILATERAL 08/26/2007  . FIBROMYALGIA 08/26/2007  . DIZZINESS OR VERTIGO 08/26/2007  . EDEMA, CHRONIC 08/26/2007  . URINARY  INCONTINENCE 08/26/2007  . HEMATURIA, HX OF 08/26/2007   Past Medical History  Diagnosis Date  . Allergic rhinitis   . GERD (gastroesophageal reflux disease)   . Hypertension   . Depression with anxiety   . Urinary incontinence   . Osteopenia   . Cataracts, bilateral     early cataracts  . Gout   . Sinusitis, chronic   . IBS (irritable bowel syndrome)     with constipation  . Arthritis   . Heart murmur 2013  . Polio age 32 months    right leg weakness and 1.5 inches shorter than left leg.    Past Surgical History  Procedure Laterality Date  . Hand surgery Bilateral 1999  . Carpal tunnel release Bilateral 1986, 1993  . Thumb arthroscopy  1999  . Tonsillectomy  6145    70 years old  . Colonoscopy  11/09/2011  . Polio surgery.    . Ankle surgery Right 1956  . Colonoscopy w/ polypectomy  10/21/2009  . Nasal sinus surgery  1976  . Knee surgery Bilateral 1998    x2  . Heart murmer  2014  . Colonoscopy N/A 06/24/2015    Procedure: COLONOSCOPY;  Surgeon: Gatha Mayer, MD;  Location: [facility];  Service: Endoscopy;  Laterality: N/A;   Social History  Substance Use Topics  . Smoking status: Never Smoker   . Smokeless tobacco: Never Used  . Alcohol Use: No   Family History  Problem Relation Age of Onset  . Kidney disease Mother   . Stroke Father   . Colon cancer Father 31  . Kidney disease Sister   . Cancer Paternal Aunt     COLON CA  . Colon cancer Paternal Aunt 64   Allergies  Allergen Reactions  . Clarithromycin     REACTION: throat swells  . Codeine     REACTION: nausea and vomiting  . Paroxetine     REACTION: doesn't work  . Penicillins     REACTION: whelps   Current Outpatient Prescriptions on File Prior to Visit  Medication Sig Dispense Refill  . Ascorbic Acid (VITAMIN C) 1000 MG tablet Take 1,000 mg by mouth daily.      Marland Kitchen aspirin 81 MG EC tablet Take 1 tablet (81 mg total) by mouth  daily. DO NOT TAKE AGAIN UNTIL 07/11/2015 30 tablet 12  . Calcium  Carbonate-Vitamin D (CALCIUM-VITAMIN D) 500-200 MG-UNIT per tablet Take 1 tablet by mouth daily. Take 400 units daily     . cholecalciferol (VITAMIN D) 1000 UNITS tablet Take 1,000 Units by mouth daily.    . colchicine 0.6 MG tablet Take 0.6 mg by mouth as needed. Reported on 06/23/2015    . conjugated estrogens (PREMARIN) vaginal cream Place 1 Applicatorful vaginally daily.    . cyclobenzaprine (FLEXERIL) 10 MG tablet Take 10 mg by mouth as needed for muscle spasms. Reported on 06/23/2015    . Dextromethorphan-Guaifenesin (MUCUS-DM MAX) 60-1200 MG TB12 Take by mouth.    . esomeprazole (NEXIUM) 40 MG capsule Take 1 capsule (40 mg total) by mouth 2 (two) times daily. 60 capsule 11  . HYDROcodone-acetaminophen (NORCO/VICODIN) 5-325 MG tablet Take 1 tablet by mouth as needed for moderate pain. Reported on 06/23/2015    . ibuprofen (ADVIL,MOTRIN) 600 MG tablet Take 1 tablet (600 mg total) by mouth as needed. DO NOT USE AGAIN UNTIL 07/11/2015 30 tablet 0  . meloxicam (MOBIC) 7.5 MG tablet Take 1 tablet (7.5 mg total) by mouth daily after breakfast. 30 tablet 0  . Misc Natural Products (OSTEO BI-FLEX JOINT SHIELD PO) Take by mouth.    . mometasone (NASONEX) 50 MCG/ACT nasal spray Place 2 sprays into the nose daily. 17 g 11  . Multiple Vitamin (MULTIVITAMIN) tablet Take 1 tablet by mouth daily.      . sennosides-docusate sodium (SENOKOT-S) 8.6-50 MG tablet Take 1 tablet by mouth daily.    . Simethicone (PHAZYME PO) Take by mouth.    . triamcinolone (KENALOG) 0.025 % cream Apply 1 application topically 2 (two) times daily. 30 g 0   No current facility-administered medications on file prior to visit.    Review of Systems Review of Systems  Constitutional: Negative for fever, appetite change, fatigue and unexpected weight change.  ENT pos for cong and rhinorrhea from allergies  Eyes: Negative for pain and visual disturbance.  Respiratory: Negative for cough and shortness of breath.   Cardiovascular:  Negative for cp or palpitations    Gastrointestinal: Negative for nausea, diarrhea and constipation.  Genitourinary: Negative for urgency and frequency.  Skin: Negative for pallor or rash   MSK pos for foot problems and pain bilat (seeing Dr Paulla Dolly) Neurological: Negative for weakness, light-headedness, numbness and headaches.  Hematological: Negative for adenopathy. Does not bruise/bleed easily.  Psychiatric/Behavioral: Negative for dysphoric mood. The patient is not nervous/anxious.         Objective:   Physical Exam  Constitutional: She appears well-developed and well-nourished. No distress.  obese and well appearing   HENT:  Head: Normocephalic and atraumatic.  Right Ear: External ear normal.  Left Ear: External ear normal.  Mouth/Throat: Oropharynx is clear and moist.  Eyes: Conjunctivae and EOM are normal. Pupils are equal, round, and reactive to light. No scleral icterus.  Neck: Normal range of motion. Neck supple. No JVD present. Carotid bruit is not present. No thyromegaly present.  Cardiovascular: Normal rate, regular rhythm, normal heart sounds and intact distal pulses.  Exam reveals no gallop.   Some small compressible varicosities on legs  Pulmonary/Chest: Effort normal and breath sounds normal. No respiratory distress. She has no wheezes. She exhibits no tenderness.  Abdominal: Soft. Bowel sounds are normal. She exhibits no distension, no abdominal bruit and no mass. There is no tenderness.  Genitourinary:  Sees gyn  Musculoskeletal: Normal range  of motion. She exhibits no edema or tenderness.  Shorter R leg baseline from polio/also atrophy of calf     Lymphadenopathy:    She has no cervical adenopathy.  Neurological: She is alert. She has normal reflexes. No cranial nerve deficit. She exhibits normal muscle tone. Coordination normal.  Skin: Skin is warm and dry. No rash noted. No erythema. No pallor.  Resolving poison ivy dermatitis on both legs   Psychiatric: She  has a normal mood and affect.          Assessment & Plan:   Problem List Items Addressed This Visit      Musculoskeletal and Integument   Osteopenia - Primary    Last dexa was normal - no fractures/on ca and D  Disc need for calcium/ vitamin D/ wt bearing exercise and bone density test every 2 y to monitor Disc safety/ fracture risk in detail          Other   Hyperlipidemia, mild    Disc goals for lipids and reasons to control them Rev labs with pt Rev low sat fat diet in detail       Relevant Medications   hydrochlorothiazide (HYDRODIURIL) 25 MG tablet   Routine general medical examination at a health care facility    Reviewed health habits including diet and exercise and skin cancer prevention Reviewed appropriate screening tests for age  Also reviewed health mt list, fam hx and immunization status , as well as social and family history   See HPI Labs today reviewed  Given handout re: choices for getting Tetanus shot  Will f/u with Katha Cabal for her medicare wellness visit on a different day since she has healthteam adv Enc wt loss and good diet/exercise  Disc fall prev

## 2015-10-25 NOTE — Assessment & Plan Note (Signed)
Last dexa was normal - no fractures/on ca and D  Disc need for calcium/ vitamin D/ wt bearing exercise and bone density test every 2 y to monitor Disc safety/ fracture risk in detail

## 2015-10-31 ENCOUNTER — Ambulatory Visit (INDEPENDENT_AMBULATORY_CARE_PROVIDER_SITE_OTHER): Payer: PPO | Admitting: Podiatry

## 2015-10-31 ENCOUNTER — Encounter: Payer: Self-pay | Admitting: Podiatry

## 2015-10-31 DIAGNOSIS — M779 Enthesopathy, unspecified: Secondary | ICD-10-CM

## 2015-10-31 DIAGNOSIS — M2042 Other hammer toe(s) (acquired), left foot: Secondary | ICD-10-CM

## 2015-11-01 ENCOUNTER — Ambulatory Visit (INDEPENDENT_AMBULATORY_CARE_PROVIDER_SITE_OTHER): Payer: PPO

## 2015-11-01 VITALS — BP 128/70 | HR 66 | Temp 98.0°F | Ht 64.0 in | Wt 188.8 lb

## 2015-11-01 DIAGNOSIS — Z0000 Encounter for general adult medical examination without abnormal findings: Secondary | ICD-10-CM | POA: Diagnosis not present

## 2015-11-01 NOTE — Progress Notes (Signed)
Subjective:   Veronica Greene is a 70 y.o. female who presents for Medicare Annual (Subsequent) preventive examination.  Cardiac Risk Factors include: advanced age (>59men, >70 women);obesity (BMI >30kg/m2);dyslipidemia     Objective:     Vitals: BP 128/70 mmHg  Pulse 66  Temp(Src) 98 F (36.7 C) (Oral)  Ht 5\' 4"  (1.626 m)  Wt 188 lb 12 oz (85.616 kg)  BMI 32.38 kg/m2  SpO2 95%  Body mass index is 32.38 kg/(m^2).   Tobacco History  Smoking status  . Never Smoker   Smokeless tobacco  . Never Used     Counseling given: No   Past Medical History  Diagnosis Date  . Allergic rhinitis   . GERD (gastroesophageal reflux disease)   . Hypertension   . Depression with anxiety   . Urinary incontinence   . Osteopenia   . Cataracts, bilateral     early cataracts  . Gout   . Sinusitis, chronic   . IBS (irritable bowel syndrome)     with constipation  . Arthritis   . Heart murmur 2013  . Polio age 22 months    right leg weakness and 1.5 inches shorter than left leg.    Past Surgical History  Procedure Laterality Date  . Hand surgery Bilateral 1999  . Carpal tunnel release Bilateral 1986, 1993  . Thumb arthroscopy  1999  . Tonsillectomy  4848    70 years old  . Colonoscopy  11/09/2011  . Polio surgery.    . Ankle surgery Right 1956  . Colonoscopy w/ polypectomy  10/21/2009  . Nasal sinus surgery  1976  . Knee surgery Bilateral 1998    x2  . Heart murmer  2014  . Colonoscopy N/A 06/24/2015    Procedure: COLONOSCOPY;  Surgeon: Gatha Mayer, MD;  Location: [facility];  Service: Endoscopy;  Laterality: N/A;   Family History  Problem Relation Age of Onset  . Kidney disease Mother   . Stroke Father   . Colon cancer Father 2  . Kidney disease Sister   . Cancer Paternal Aunt     COLON CA  . Colon cancer Paternal Aunt 20   History  Sexual Activity  . Sexual Activity: Yes    Outpatient Encounter Prescriptions as of 11/01/2015  Medication Sig  . Ascorbic  Acid (VITAMIN C) 1000 MG tablet Take 1,000 mg by mouth daily.    Marland Kitchen aspirin 81 MG EC tablet Take 1 tablet (81 mg total) by mouth daily. DO NOT TAKE AGAIN UNTIL 07/11/2015  . Calcium Carbonate-Vitamin D (CALCIUM-VITAMIN D) 500-200 MG-UNIT per tablet Take 1 tablet by mouth daily. Take 400 units daily   . cholecalciferol (VITAMIN D) 1000 UNITS tablet Take 1,000 Units by mouth daily.  . colchicine 0.6 MG tablet Take 0.6 mg by mouth as needed. Reported on 06/23/2015  . conjugated estrogens (PREMARIN) vaginal cream Place 1 Applicatorful vaginally daily.  . cyclobenzaprine (FLEXERIL) 10 MG tablet Take 10 mg by mouth as needed for muscle spasms. Reported on 06/23/2015  . Dextromethorphan-Guaifenesin (MUCUS-DM MAX) 60-1200 MG TB12 Take by mouth.  . esomeprazole (NEXIUM) 40 MG capsule Take 1 capsule (40 mg total) by mouth 2 (two) times daily.  . hydrochlorothiazide (HYDRODIURIL) 25 MG tablet Take 1 tablet (25 mg total) by mouth daily. Reported on 06/23/2015  . HYDROcodone-acetaminophen (NORCO/VICODIN) 5-325 MG tablet Take 1 tablet by mouth as needed for moderate pain. Reported on 06/23/2015  . ibuprofen (ADVIL,MOTRIN) 600 MG tablet Take 1 tablet (600 mg  total) by mouth as needed. DO NOT USE AGAIN UNTIL 07/11/2015  . lubiprostone (AMITIZA) 24 MCG capsule TAKE 1 CAPSULE BY MOUTH TWICE A DAY.  . meclizine (ANTIVERT) 25 MG tablet Take 1 tablet (25 mg total) by mouth 3 (three) times daily as needed for dizziness (caution of sedation).  . meloxicam (MOBIC) 7.5 MG tablet Take 1 tablet (7.5 mg total) by mouth daily after breakfast.  . Misc Natural Products (OSTEO BI-FLEX JOINT SHIELD PO) Take by mouth.  . mometasone (NASONEX) 50 MCG/ACT nasal spray Place 2 sprays into the nose daily.  . Multiple Vitamin (MULTIVITAMIN) tablet Take 1 tablet by mouth daily.    . potassium chloride (K-DUR) 10 MEQ tablet Take 2 tablets (20 mEq total) by mouth 2 (two) times daily.  . sennosides-docusate sodium (SENOKOT-S) 8.6-50 MG tablet  Take 1 tablet by mouth daily.  . Simethicone (PHAZYME PO) Take by mouth.  . triamcinolone (KENALOG) 0.025 % cream Apply 1 application topically 2 (two) times daily.  Marland Kitchen venlafaxine (EFFEXOR) 75 MG tablet Take 1 tablet (75 mg total) by mouth 2 (two) times daily.   No facility-administered encounter medications on file as of 11/01/2015.    Activities of Daily Living In your present state of health, do you have any difficulty performing the following activities: 11/01/2015  Hearing? Y  Vision? N  Difficulty concentrating or making decisions? Y  Walking or climbing stairs? Y  Dressing or bathing? N  Doing errands, shopping? N  Preparing Food and eating ? N  Using the Toilet? N  In the past six months, have you accidently leaked urine? N  Do you have problems with loss of bowel control? N  Managing your Medications? N  Managing your Finances? N  Housekeeping or managing your Housekeeping? N    Patient Care Team: Abner Greenspan, MD as PCP - General Wallene Huh, DPM as Consulting Physician (Podiatry) Ninetta Lights, MD as Consulting Physician (Orthopedic Surgery) Rutherford Guys, MD as Consulting Physician (Ophthalmology) Jerrell Belfast, MD as Consulting Physician (Otolaryngology) Lavella Hammock, DDS as Consulting Physician (Dentistry)    Assessment:     Hearing Screening Comments: Wears bilateral hearing aids Vision Screening Comments: Last eye exam in 06/2015  Exercise Activities and Dietary recommendations Current Exercise Habits: The patient does not participate in regular exercise at present (pt does yard work for 2-3 hrs when weather permits), Exercise limited by: orthopedic condition(s)  Goals    . Increase physical activity     Target weight loss is 10 lbs. Starting 11/06/2015, I will decrease intake of simple carbohydrates to 1 serving per day.       Fall Risk Fall Risk  11/01/2015 10/25/2015 10/12/2014 09/30/2013  Falls in the past year? Yes Yes Yes Yes  Number falls in  past yr: 2 or more 1 2 or more 2 or more  Injury with Fall? No No Yes -  Risk Factor Category  High Fall Risk - - High Fall Risk  Risk for fall due to : - - - Impaired balance/gait  Follow up Falls evaluation completed;Education provided - - -   Depression Screen PHQ 2/9 Scores 11/01/2015 10/25/2015 10/12/2014 09/30/2013  PHQ - 2 Score 0 0 1 0     Cognitive Testing MMSE - Mini Mental State Exam 11/01/2015  Orientation to time 5  Orientation to Place 5  Registration 3  Attention/ Calculation 0  Recall 3  Language- name 2 objects 0  Language- repeat 1  Language- follow 3 step command  3  Language- read & follow direction 0  Write a sentence 0  Copy design 0  Total score 20   PLEASE NOTE: A Mini-Cog screen was completed. Maximum score is 20. A value of 0 denotes this part of [place] MMSE was not completed.  Orientation to Time - Max 5 Orientation to Place - Max 5 Registration - Max 3 Recall - Max 3 Language Repeat - Max 1 Language Follow 3 Step Command - Max 3  Immunization History  Administered Date(s) Administered  . Influenza Whole 04/08/2008, 06/30/2009  . Influenza, High Dose Seasonal PF 05/17/2015  . Influenza-Unspecified 05/09/2014  . Pneumococcal Conjugate-13 10/12/2014  . Pneumococcal Polysaccharide-23 08/25/2010  . Td 10/15/2003  . Zoster 08/12/2008   Screening Tests Health Maintenance  Topic Date Due  . TETANUS/TDAP  10/31/2016 (Originally 10/14/2013)  . INFLUENZA VACCINE  02/07/2016  . MAMMOGRAM  05/02/2016  . COLONOSCOPY  06/23/2017  . DEXA SCAN  Completed  . ZOSTAVAX  Completed  . Hepatitis C Screening  Addressed  . PNA vac Low Risk Adult  Completed      Plan:     I have personally reviewed and addressed the Medicare Annual Wellness questionnaire and have noted the following in the patient's chart:  A. Medical and social history B. Use of alcohol, tobacco or illicit drugs  C. Current medications and supplements D. Functional ability and status E.    Nutritional status F.  Physical activity G. Advance directives H. List of other physicians I.  Hospitalizations, surgeries, and ER visits in previous 12 months J.  [place] to include hearing, vision, cognitive, depression L. Referrals and appointments - none  In addition, I have reviewed and discussed with patient certain preventive protocols, quality metrics, and best practice recommendations. A written personalized care plan for preventive services as well as general preventive health recommendations were provided to patient.  See attached scanned questionnaire for additional information.   Signed,   Lindell Noe, MHA, BS, LPN Health Advisor [id number]

## 2015-11-01 NOTE — Patient Instructions (Signed)
Ms. Czarnowski , Thank you for taking time to come for your Medicare Wellness Visit. I appreciate your ongoing commitment to your health goals. Please review the following plan we discussed and let me know if I can assist you in the future.   These are the goals we discussed: Goals    . Increase physical activity     Target weight loss is 10 lbs. Starting 11/06/2015, I will decrease intake of simple carbohydrates to 1 serving per day.        This is a list of the screening recommended for you and due dates:  Health Maintenance  Topic Date Due  . Tetanus Vaccine  10/31/2016*  . Flu Shot  02/07/2016  . Mammogram  05/02/2016  . Colon Cancer Screening  06/23/2017  . DEXA scan (bone density measurement)  Completed  . Shingles Vaccine  Completed  .  Hepatitis C: One time screening is recommended by Center for Disease Control  (CDC) for  adults born from [birth year] through 1966.   Addressed  . Pneumonia vaccines  Completed  *Topic was postponed. The date shown is not the original due date.    Preventive Care for Adults  A healthy lifestyle and preventive care can promote health and wellness. Preventive health guidelines for adults include the following key practices.  . A routine yearly physical is a good way to check with your health care provider about your health and preventive screening. It is a chance to share any concerns and updates on your health and to receive a thorough exam.  . Visit your dentist for a routine exam and preventive care every 6 months. Brush your teeth twice a day and floss once a day. Good oral hygiene prevents tooth decay and gum disease.  . The frequency of eye exams is based on your age, health, family medical history, use  of contact lenses, and other factors. Follow your health care provider's ecommendations for frequency of eye exams.  . Eat a healthy diet. Foods like vegetables, fruits, whole grains, low-fat dairy products, and lean protein foods contain the  nutrients you need without too many calories. Decrease your intake of foods high in solid fats, added sugars, and salt. Eat the right amount of calories for you. Get information about a proper diet from your health care provider, if necessary.  . Regular physical exercise is one of the most important things you can do for your health. Most adults should get at least 150 minutes of moderate-intensity exercise (any activity that increases your heart rate and causes you to sweat) each week. In addition, most adults need muscle-strengthening exercises on 2 or more days a week.  Silver Sneakers may be a benefit available to you. To determine eligibility, you may visit the website: www.silversneakers.com or contact program at (234) 141-9586 Mon-Fri between 8AM-8PM.   . Maintain a healthy weight. The body mass index (BMI) is a screening tool to identify possible weight problems. It provides an estimate of body fat based on height and weight. Your health care provider can find your BMI and can help you achieve or maintain a healthy weight.   For adults 20 years and older: ? A BMI below 18.5 is considered underweight. ? A BMI of 18.5 to 24.9 is normal. ? A BMI of 25 to 29.9 is considered overweight. ? A BMI of 30 and above is considered obese.   . Maintain normal blood lipids and cholesterol levels by exercising and minimizing your intake of saturated fat. Eat  a balanced diet with plenty of fruit and vegetables. Blood tests for lipids and cholesterol should begin at age 77 and be repeated every 5 years. If your lipid or cholesterol levels are high, you are over 50, or you are at high risk for heart disease, you may need your cholesterol levels checked more frequently. Ongoing high lipid and cholesterol levels should be treated with medicines if diet and exercise are not working.  . If you smoke, find out from your health care provider how to quit. If you do not use tobacco, please do not start.  . If you  choose to drink alcohol, please do not consume more than 2 drinks per day. One drink is considered to be 12 ounces (355 mL) of beer, 5 ounces (148 mL) of wine, or 1.5 ounces (44 mL) of liquor.  . If you are 46-19 years old, ask your health care provider if you should take aspirin to prevent strokes.  . Use sunscreen. Apply sunscreen liberally and repeatedly throughout the day. You should seek shade when your shadow is shorter than you. Protect yourself by wearing long sleeves, pants, a wide-brimmed hat, and sunglasses year round, whenever you are outdoors.  . Once a month, do a whole body skin exam, using a mirror to look at the skin on your back. Tell your health care provider of new moles, moles that have irregular borders, moles that are larger than a pencil eraser, or moles that have changed in shape or color.

## 2015-11-01 NOTE — Progress Notes (Signed)
Pre visit review using our clinic review tool, if applicable. No additional management support is needed unless otherwise documented below in the visit note. 

## 2015-11-02 NOTE — Progress Notes (Signed)
Subjective:     Patient ID: Veronica Greene, female   DOB: [birth year], 70 y.o.   MRN: [id number]  HPI patient states she continues to have discomfort in the left forefoot around the second metatarsal. It is improved but present   Review of Systems     Objective:   Physical Exam Neurovascular status intact with inflammation around the left second MPJ localized in nature with no proximal irritation    Assessment:     Continued inflammatory complex around the second metatarsal left    Plan:     Advised on physical therapy and did scanned for a new type of orthotic to reduce pressure against the metatarsal. Reappoint when returned

## 2015-11-02 NOTE — Progress Notes (Signed)
   Subjective:    Patient ID: Veronica Greene, female    DOB: October 11, 1945, 70 y.o.   MRN: [id number]  HPI    Review of Systems     Objective:   Physical Exam        Assessment & Plan:  I reviewed health advisor's note, was available for consultation, and agree with documentation and plan.

## 2015-11-09 ENCOUNTER — Encounter: Payer: Self-pay | Admitting: Gastroenterology

## 2015-11-22 ENCOUNTER — Ambulatory Visit: Payer: PPO | Admitting: *Deleted

## 2015-11-22 DIAGNOSIS — M779 Enthesopathy, unspecified: Secondary | ICD-10-CM

## 2015-11-22 NOTE — Progress Notes (Signed)
Patient ID: Veronica Greene, female   DOB: 23-Feb-1946, 70 y.o.   MRN: [id number] Patient presents for orthotic pick up.  Verbal and written break in and wear instructions given.  Patient will follow up in 4 weeks if symptoms worsen or fail to improve.

## 2015-11-22 NOTE — Patient Instructions (Signed)

## 2016-01-30 ENCOUNTER — Other Ambulatory Visit: Payer: Self-pay | Admitting: Obstetrics and Gynecology

## 2016-01-30 ENCOUNTER — Other Ambulatory Visit (HOSPITAL_COMMUNITY)
Admission: RE | Admit: 2016-01-30 | Discharge: 2016-01-30 | Disposition: A | Discharge status: Home / Self Care | Payer: PPO | Source: Ambulatory Visit | Attending: Obstetrics and Gynecology | Admitting: Obstetrics and Gynecology

## 2016-01-30 DIAGNOSIS — Z124 Encounter for screening for malignant neoplasm of cervix: Secondary | ICD-10-CM | POA: Insufficient documentation

## 2016-01-30 DIAGNOSIS — N898 Other specified noninflammatory disorders of vagina: Secondary | ICD-10-CM | POA: Diagnosis not present

## 2016-01-30 DIAGNOSIS — N950 Postmenopausal bleeding: Secondary | ICD-10-CM | POA: Diagnosis not present

## 2016-02-01 LAB — CYTOLOGY - PAP

## 2016-02-07 ENCOUNTER — Other Ambulatory Visit: Payer: Self-pay | Admitting: Family Medicine

## 2016-02-07 DIAGNOSIS — Z1231 Encounter for screening mammogram for malignant neoplasm of breast: Secondary | ICD-10-CM

## 2016-02-20 DIAGNOSIS — H02821 Cysts of right upper eyelid: Secondary | ICD-10-CM | POA: Diagnosis not present

## 2016-02-27 ENCOUNTER — Telehealth: Payer: Self-pay

## 2016-02-27 MED ORDER — VENLAFAXINE HCL ER 75 MG PO CP24: 75.0000 mg | ORAL_CAPSULE | Freq: Every day | ORAL | 3 refills | Status: DC

## 2016-02-27 NOTE — Telephone Encounter (Signed)
If she likes the short acting (not xr)- take it twice daily (it may also be cheaper)  If she likes the long acting -take the XR just once daily

## 2016-02-27 NOTE — Telephone Encounter (Signed)
Pt wants to go back to the XR once daily, I will send in new Rx and pt will check with pharmacy to make sure it's covered

## 2016-02-27 NOTE — Telephone Encounter (Signed)
When I called pt she advised me that before this refill pt was taking Effexor XR once daily but when she picked up the Rx on Friday the pharmacist told her that this Rx was just for the regular Effexor and she should be taking this one BID since it's not the XR. Pt started taking med BID since Friday and she said it does seem to help some taking it BID but she doesn't know if she should go back to the once daily and if so does she need a new Rx for the XR dose or should she stick with the regular effexor and take it BID, pt said she had trouble going to sleep on Saturday but not Friday or Sunday so she wasn't sure if that's a side eff of taking med BID or just a separate issue. Pt is confused on what she should be taking, please advise

## 2016-02-27 NOTE — Telephone Encounter (Signed)
Please send in effexor XR 75 mg once daily - refill for a year Thanks

## 2016-02-27 NOTE — Telephone Encounter (Signed)
Pt left v/m; pt just realized that generic effexor was changed from capsule to tablets and instructions have to take med bid; pt has been taking one daily. Last annual 10/2015. Pt request cb.

## 2016-02-28 DIAGNOSIS — N950 Postmenopausal bleeding: Secondary | ICD-10-CM | POA: Diagnosis not present

## 2016-02-28 DIAGNOSIS — R102 Pelvic and perineal pain: Secondary | ICD-10-CM | POA: Diagnosis not present

## 2016-03-29 ENCOUNTER — Other Ambulatory Visit: Payer: Self-pay | Admitting: *Deleted

## 2016-03-29 MED ORDER — MELOXICAM 7.5 MG PO TABS: 7.5000 mg | ORAL_TABLET | Freq: Every day | ORAL | 5 refills | Status: DC

## 2016-03-29 NOTE — Telephone Encounter (Signed)
Please refill times 5  Thanks

## 2016-03-29 NOTE — Telephone Encounter (Signed)
Fax refill request, pt has CPE scheduled on 11/06/15, last filled on 09/09/15 #30 with 0 refills, please advise

## 2016-04-04 DIAGNOSIS — M79642 Pain in left hand: Secondary | ICD-10-CM | POA: Diagnosis not present

## 2016-04-04 DIAGNOSIS — R52 Pain, unspecified: Secondary | ICD-10-CM | POA: Diagnosis not present

## 2016-04-04 DIAGNOSIS — M1A0420 Idiopathic chronic gout, left hand, without tophus (tophi): Secondary | ICD-10-CM | POA: Diagnosis not present

## 2016-04-11 DIAGNOSIS — N952 Postmenopausal atrophic vaginitis: Secondary | ICD-10-CM | POA: Diagnosis not present

## 2016-04-11 DIAGNOSIS — Z01419 Encounter for gynecological examination (general) (routine) without abnormal findings: Secondary | ICD-10-CM | POA: Diagnosis not present

## 2016-04-30 ENCOUNTER — Ambulatory Visit (INDEPENDENT_AMBULATORY_CARE_PROVIDER_SITE_OTHER): Payer: PPO | Admitting: Family Medicine

## 2016-04-30 ENCOUNTER — Ambulatory Visit (INDEPENDENT_AMBULATORY_CARE_PROVIDER_SITE_OTHER): Payer: PPO | Admitting: Podiatry

## 2016-04-30 ENCOUNTER — Ambulatory Visit: Payer: PPO

## 2016-04-30 ENCOUNTER — Encounter: Payer: Self-pay | Admitting: Podiatry

## 2016-04-30 ENCOUNTER — Encounter: Payer: Self-pay | Admitting: Family Medicine

## 2016-04-30 ENCOUNTER — Ambulatory Visit (INDEPENDENT_AMBULATORY_CARE_PROVIDER_SITE_OTHER): Payer: PPO

## 2016-04-30 VITALS — BP 138/70 | HR 79 | Temp 97.7°F | Ht 64.0 in | Wt 187.5 lb

## 2016-04-30 DIAGNOSIS — M79672 Pain in left foot: Secondary | ICD-10-CM | POA: Diagnosis not present

## 2016-04-30 DIAGNOSIS — M79645 Pain in left finger(s): Secondary | ICD-10-CM | POA: Diagnosis not present

## 2016-04-30 DIAGNOSIS — M2042 Other hammer toe(s) (acquired), left foot: Secondary | ICD-10-CM | POA: Diagnosis not present

## 2016-04-30 DIAGNOSIS — Q828 Other specified congenital malformations of skin: Secondary | ICD-10-CM

## 2016-04-30 DIAGNOSIS — M722 Plantar fascial fibromatosis: Secondary | ICD-10-CM | POA: Diagnosis not present

## 2016-04-30 DIAGNOSIS — M1000 Idiopathic gout, unspecified site: Secondary | ICD-10-CM | POA: Diagnosis not present

## 2016-04-30 DIAGNOSIS — M79671 Pain in right foot: Secondary | ICD-10-CM

## 2016-04-30 MED ORDER — TRIAMCINOLONE ACETONIDE 10 MG/ML IJ SUSP
10.0000 mg | Freq: Once | INTRAMUSCULAR | Status: AC
  Administered 2016-04-30: 10 mg

## 2016-04-30 MED ORDER — COLCHICINE 0.6 MG PO TABS: 0.6000 mg | ORAL_TABLET | Freq: Two times a day (BID) | ORAL | 2 refills | Status: DC

## 2016-04-30 NOTE — Progress Notes (Signed)
Pre visit review using our clinic review tool, if applicable. No additional management support is needed unless otherwise documented below in the visit note. 

## 2016-04-30 NOTE — Progress Notes (Signed)
Dr. Frederico Hamman T. Iliyana Convey, MD, [facility] [place], [address] Phone: [phone] Fax: [phone]  04/30/2016  Patient: Veronica Greene, MRN: [id number], DOB: [birth year], 70 y.o.  Primary Physician:  Loura Pardon, MD   Chief Complaint  Patient presents with  . Hand Pain    Left Ring Finger-Injuried in June   Subjective:   Veronica Greene is a 70 y.o. very pleasant female patient who presents with the following:  Left finger pain: Fell and swelled up immediately  Saw Dr. Fredna Dow - knuckles are really sore.   Upon review of the record, I saw her 3 years ago and felt like she had gout of the wrist and it her wrist aspiration and injection at that point which resolved those symptoms.  A day or 2 later she had some swelling in her knee, and we gave her some oral colchicine which resolved those symptoms.  She also does have gout on her problem list, and this was on her problem list at the [place] conversion in 2012.  She does not know who initially diagnosed her with gout, and she is not sure if she definitively carries this diagnosis. I cannot find a synovial fluid analysis.   The patient had an injury 4 months ago.  She saw hand surgery 1 month ago.  He felt like she had some mild PIP joint osteoarthritis with possibly some gout.  Suggested reevaluation and consideration of potential colchicine.  Sister with Rheumatoid Arthritis   Past Medical History, Surgical History, Social History, Family History, Problem List, Medications, and Allergies have been reviewed and updated if relevant.  Patient Active Problem List   Diagnosis Date Noted  . Routine general medical examination at a health care facility 10/15/2015  . Melena   . BPV (benign positional vertigo) 06/18/2014  . Sinus congestion 06/18/2014  . Encounter for Medicare annual wellness exam 09/30/2013  . Mid back pain on left side 06/01/2013  . Back pain 02/16/2013  . Contact  dermatitis 10/30/2012  . Hyperlipidemia, mild 09/02/2012  . Other screening mammogram 12/12/2011  . Screening for lipoid disorders 08/31/2011  . Degenerative disk disease 08/31/2011  . Hypokalemia 01/02/2011  . Personal history of colonic polyps 11/03/2010  . Constipation, slow transit 11/03/2010  . Benign neoplasm of colon 11/03/2010  . Melanosis coli 11/03/2010  . Gout 08/25/2010  . DERMATITIS, SEBORRHEIC 08/25/2010  . Osteopenia 09/05/2007  . History of poliomyelitis 08/26/2007  . Obesity 08/26/2007  . ANXIETY 08/26/2007  . DEPRESSION 08/26/2007  . ALLERGIC RHINITIS 08/26/2007  . GERD 08/26/2007  . IBS 08/26/2007  . OVERACTIVE BLADDER 08/26/2007  . OSTEOARTHRITIS, HANDS, BILATERAL 08/26/2007  . FIBROMYALGIA 08/26/2007  . DIZZINESS OR VERTIGO 08/26/2007  . EDEMA, CHRONIC 08/26/2007  . URINARY INCONTINENCE 08/26/2007  . HEMATURIA, HX OF 08/26/2007    Past Medical History:  Diagnosis Date  . Allergic rhinitis   . Arthritis   . Cataracts, bilateral    early cataracts  . Depression with anxiety   . GERD (gastroesophageal reflux disease)   . Gout   . Heart murmur 2013  . Hypertension   . IBS (irritable bowel syndrome)    with constipation  . Osteopenia   . Polio age 27 months   right leg weakness and 1.5 inches shorter than left leg.   . Sinusitis, chronic   . Urinary incontinence     Past Surgical History:  Procedure Laterality Date  . ANKLE SURGERY Right  [place] Bilateral 1986, 1993  . COLONOSCOPY  11/09/2011  . COLONOSCOPY N/A 06/24/2015   Procedure: COLONOSCOPY;  Surgeon: Gatha Mayer, MD;  Location: [facility];  Service: Endoscopy;  Laterality: N/A;  . COLONOSCOPY W/ POLYPECTOMY  10/21/2009  . HAND SURGERY Bilateral 1999  . heart murmer  2014  . KNEE SURGERY Bilateral 1998   x2  . NASAL SINUS SURGERY  1976  . polio surgery.    . THUMB ARTHROSCOPY  1999  . TONSILLECTOMY  7144   70 years old    Social History   Social  History  . Marital status: Widowed    Spouse name: N/A  . Number of children: 1  . Years of education: N/A   Occupational History  . [employer]   Social History Main Topics  . Smoking status: Never Smoker  . Smokeless tobacco: Never Used  . Alcohol use No  . Drug use: No  . Sexual activity: Yes   Other Topics Concern  . Not on file   Social History Narrative  . No narrative on file    Family History  Problem Relation Age of Onset  . Kidney disease Mother   . Stroke Father   . Colon cancer Father 44  . Kidney disease Sister   . Cancer Paternal Aunt     COLON CA  . Colon cancer Paternal Aunt 77    Allergies  Allergen Reactions  . Clarithromycin     REACTION: throat swells  . Codeine     REACTION: nausea and vomiting  . Paroxetine     REACTION: doesn't work  . Penicillins     REACTION: whelps    Medication list reviewed and updated in full in [place].  GEN: No fevers, chills. Nontoxic. Primarily MSK c/o today. MSK: Detailed in the HPI GI: tolerating PO intake without difficulty Neuro: No numbness, parasthesias, or tingling associated. Otherwise the pertinent positives of the ROS are noted above.   Objective:   BP 138/70   Pulse 79   Temp 97.7 F (36.5 C) (Oral)   Ht 5\' 4"  (1.626 m)   Wt 187 lb 8 oz (85 kg)   BMI 32.18 kg/m    GEN: WDWN, NAD, Non-toxic, Alert & Oriented x 3 HEENT: Atraumatic, Normocephalic.  Ears and Nose: No external deformity. EXTR: No clubbing/cyanosis/edema NEURO: Normal gait.  PSYCH: Normally interactive. Conversant. Not depressed or anxious appearing.  Calm demeanor.    Patient has osteoarthritic changes throughout most of her hand, including the DIP, PIP, and MCP joints.  There is also some CMC arthritis on the first digit.  She has a loss of approximately 25% of flexion and extension at the wrist.  There is some increased fullness at the PIP joint at the fourth digit on the left.  She is able to fully  close it an open it.  There is some general aching when she does this.  Radiology: Hand surgery notes are reviewed, and they indicate no fracture and with some mild IP joint osteoarthritis.  Assessment and Plan:   Finger pain, left  Idiopathic gout, unspecified chronicity, unspecified site   For months after injury, very difficult to tell exactly what happened.  No signs of an old fracture per hand surgical notes.  I reassured her.  Work on range of motion. Sometimes after a knuckle injury there can be a permanent enlargement.  Primarily, I think she wants to see if anything further could  be done to decrease her knuckle size prior to her marriage.  I suggested trying some colchicine to see if this helped. Certainly, in the past I felt like she also had gout. Obtaining joint fluid in the future would be helpful.  Follow-up: No Follow-up on file.  New Prescriptions   COLCHICINE 0.6 MG TABLET    Take 1 tablet (0.6 mg total) by mouth 2 (two) times daily.   Signed,  Maud Deed. Chanese Hartsough, MD   Patient's Medications  New Prescriptions   COLCHICINE 0.6 MG TABLET    Take 1 tablet (0.6 mg total) by mouth 2 (two) times daily.  Previous Medications   ASCORBIC ACID (VITAMIN C) 1000 MG TABLET    Take 1,000 mg by mouth daily.     ASPIRIN 81 MG EC TABLET    Take 1 tablet (81 mg total) by mouth daily. DO NOT TAKE AGAIN UNTIL 07/11/2015   CALCIUM CARBONATE-VITAMIN D (CALCIUM-VITAMIN D) 500-200 MG-UNIT PER TABLET    Take 1 tablet by mouth daily. Take 400 units daily    CHOLECALCIFEROL (VITAMIN D) 1000 UNITS TABLET    Take 1,000 Units by mouth daily.   CONJUGATED ESTROGENS (PREMARIN) VAGINAL CREAM    Place 1 Applicatorful vaginally daily.   CYCLOBENZAPRINE (FLEXERIL) 10 MG TABLET    Take 10 mg by mouth as needed for muscle spasms. Reported on 06/23/2015   DEXTROMETHORPHAN-GUAIFENESIN (MUCUS-DM MAX) 60-1200 MG TB12    Take by mouth.   ESOMEPRAZOLE (NEXIUM) 40 MG CAPSULE    Take 1 capsule (40 mg total) by  mouth 2 (two) times daily.   FLUTICASONE (FLONASE) 50 MCG/ACT NASAL SPRAY    Place into both nostrils daily.   HYDROCHLOROTHIAZIDE (HYDRODIURIL) 25 MG TABLET    Take 1 tablet (25 mg total) by mouth daily. Reported on 06/23/2015   HYDROCODONE-ACETAMINOPHEN (NORCO/VICODIN) 5-325 MG TABLET    Take 1 tablet by mouth as needed for moderate pain. Reported on 06/23/2015   LUBIPROSTONE (AMITIZA) 24 MCG CAPSULE    TAKE 1 CAPSULE BY MOUTH TWICE A DAY.   MECLIZINE (ANTIVERT) 25 MG TABLET    Take 1 tablet (25 mg total) by mouth 3 (three) times daily as needed for dizziness (caution of sedation).   MELOXICAM (MOBIC) 7.5 MG TABLET    Take 1 tablet (7.5 mg total) by mouth daily after breakfast.   MISC NATURAL PRODUCTS (OSTEO BI-FLEX JOINT SHIELD PO)    Take by mouth.   MULTIPLE VITAMIN (MULTIVITAMIN) TABLET    Take 1 tablet by mouth daily.     POTASSIUM CHLORIDE (K-DUR) 10 MEQ TABLET    Take 2 tablets (20 mEq total) by mouth 2 (two) times daily.   SENNOSIDES-DOCUSATE SODIUM (SENOKOT-S) 8.6-50 MG TABLET    Take 1 tablet by mouth daily.   SIMETHICONE (PHAZYME PO)    Take by mouth.   TRIAMCINOLONE (KENALOG) 0.025 % CREAM    Apply 1 application topically 2 (two) times daily.   VENLAFAXINE XR (EFFEXOR XR) 75 MG 24 HR CAPSULE    Take 1 capsule (75 mg total) by mouth daily.  Modified Medications   No medications on file  Discontinued Medications   COLCHICINE 0.6 MG TABLET    Take 0.6 mg by mouth as needed. Reported on 06/23/2015   MOMETASONE (NASONEX) 50 MCG/ACT NASAL SPRAY    Place 2 sprays into the nose daily.

## 2016-04-30 NOTE — Patient Instructions (Signed)

## 2016-04-30 NOTE — Progress Notes (Signed)
Subjective:     Patient ID: Veronica Greene, female   DOB: [birth year], 70 y.o.   MRN: [id number]  HPI patient presents stating that she has pain in the plantar surface of the right heel with inflammation and on the left foot she's noted to have mild discomfort with inflammation around the second MPJ and also I noted lesion plantar aspect right   Review of Systems     Objective:   Physical Exam Neurovascular status intact with inflammatory changes in the plantar aspect of the right heel with keratotic tissue formation and dorsal and lateral deviation second digit left    Assessment:     Fasciitis-like symptoms right with inflammatory capsulitis second MPJ left    Plan:     Reviewed both conditions and did careful plantar injection right 3 mg Kenalog 5 mg Xylocaine and debrided lesions and instructed on wider shoes for the left hammertoe deformity and the fact the toe may need to be straightened at one point in future

## 2016-05-01 DIAGNOSIS — D235 Other benign neoplasm of skin of trunk: Secondary | ICD-10-CM | POA: Diagnosis not present

## 2016-05-01 DIAGNOSIS — Z85828 Personal history of other malignant neoplasm of skin: Secondary | ICD-10-CM | POA: Diagnosis not present

## 2016-05-01 DIAGNOSIS — L219 Seborrheic dermatitis, unspecified: Secondary | ICD-10-CM | POA: Diagnosis not present

## 2016-05-01 DIAGNOSIS — L814 Other melanin hyperpigmentation: Secondary | ICD-10-CM | POA: Diagnosis not present

## 2016-05-01 DIAGNOSIS — D1801 Hemangioma of skin and subcutaneous tissue: Secondary | ICD-10-CM | POA: Diagnosis not present

## 2016-05-01 DIAGNOSIS — L570 Actinic keratosis: Secondary | ICD-10-CM | POA: Diagnosis not present

## 2016-05-01 DIAGNOSIS — L821 Other seborrheic keratosis: Secondary | ICD-10-CM | POA: Diagnosis not present

## 2016-05-01 DIAGNOSIS — R208 Other disturbances of skin sensation: Secondary | ICD-10-CM | POA: Diagnosis not present

## 2016-05-03 ENCOUNTER — Ambulatory Visit
Admission: RE | Admit: 2016-05-03 | Discharge: 2016-05-03 | Disposition: A | Discharge status: Home / Self Care | Payer: PPO | Source: Ambulatory Visit | Attending: Family Medicine | Admitting: Family Medicine

## 2016-05-03 DIAGNOSIS — Z1231 Encounter for screening mammogram for malignant neoplasm of breast: Secondary | ICD-10-CM | POA: Diagnosis not present

## 2016-06-28 DIAGNOSIS — H2513 Age-related nuclear cataract, bilateral: Secondary | ICD-10-CM | POA: Diagnosis not present

## 2016-09-13 ENCOUNTER — Telehealth: Payer: Self-pay

## 2016-09-13 NOTE — Telephone Encounter (Signed)
Pt wanted to get tetanus shot and she was told by ins if could be filed under Part B she can get at [facility]. Pt has upcoming appt and will talk with Dr Glori Bickers then. Nothing further needed.

## 2016-10-09 ENCOUNTER — Other Ambulatory Visit: Payer: Self-pay | Admitting: *Deleted

## 2016-10-09 MED ORDER — LUBIPROSTONE 24 MCG PO CAPS: ORAL_CAPSULE | ORAL | 0 refills | Status: DC

## 2016-10-19 DIAGNOSIS — M170 Bilateral primary osteoarthritis of knee: Secondary | ICD-10-CM | POA: Diagnosis not present

## 2016-10-21 ENCOUNTER — Telehealth: Payer: Self-pay | Admitting: Family Medicine

## 2016-10-21 DIAGNOSIS — Z0000 Encounter for general adult medical examination without abnormal findings: Secondary | ICD-10-CM

## 2016-10-21 NOTE — Telephone Encounter (Signed)
-----   Message from Ellamae Sia sent at 10/18/2016  5:08 PM EDT ----- Regarding: Lab orders for Friday, 4.27.18 Patient is scheduled for CPX labs, please order future labs, Thanks , Karna Christmas

## 2016-10-26 DIAGNOSIS — M170 Bilateral primary osteoarthritis of knee: Secondary | ICD-10-CM | POA: Diagnosis not present

## 2016-10-29 ENCOUNTER — Telehealth: Payer: Self-pay | Admitting: *Deleted

## 2016-10-29 NOTE — Telephone Encounter (Signed)
Patient called stating that she had a knee injection Friday at [facility] office and this was the second set that she has had. Patient stated that Friday after the injection she became nauseated, vomiting, diarrhea and chills. Patient stated that she is still nauseated but is no longer vomiting. Patient stated that the diarrhea is not as bad, but she is still nauseated. Patient stated that she has an appointment scheduled with Allie Bossier NP tomorrow morning. Pharmacy [facility]

## 2016-10-29 NOTE — Telephone Encounter (Signed)
It sounds like her symptoms are probably from a virus (I doubt the knee injection had anything to do with it)  Keep up fluid intake in sips  Don't eat until you are ready- and eat small portions of bland foods (BRAT- bananas/rice/apple sauce and toast are examples) If high fever or abd pain - alert [place] or get to ED if after hours Keep appt with the NP

## 2016-10-29 NOTE — Telephone Encounter (Signed)
Pt notified of Dr. Marliss Coots comments and instructions and verbalized understanding, she will keep her appt with Anda Kraft, NP tomorrow

## 2016-10-30 ENCOUNTER — Other Ambulatory Visit: Payer: Self-pay | Admitting: Family Medicine

## 2016-10-30 ENCOUNTER — Encounter: Payer: Self-pay | Admitting: Primary Care

## 2016-10-30 ENCOUNTER — Ambulatory Visit (INDEPENDENT_AMBULATORY_CARE_PROVIDER_SITE_OTHER): Payer: PPO | Admitting: Primary Care

## 2016-10-30 VITALS — BP 136/74 | HR 69 | Temp 97.8°F | Ht 64.0 in | Wt 184.4 lb

## 2016-10-30 DIAGNOSIS — R1011 Right upper quadrant pain: Secondary | ICD-10-CM

## 2016-10-30 DIAGNOSIS — R112 Nausea with vomiting, unspecified: Secondary | ICD-10-CM | POA: Diagnosis not present

## 2016-10-30 LAB — CBC WITH DIFFERENTIAL/PLATELET
Basophils Absolute: 0 10*3/uL (ref 0.0–0.1)
Basophils Relative: 0.3 % (ref 0.0–3.0)
EOS ABS: 0.1 10*3/uL (ref 0.0–0.7)
EOS PCT: 2.3 % (ref 0.0–5.0)
HEMATOCRIT: 39.3 % (ref 36.0–46.0)
HEMOGLOBIN: 13.6 g/dL (ref 12.0–15.0)
LYMPHS PCT: 29.8 % (ref 12.0–46.0)
Lymphs Abs: 1.3 10*3/uL (ref 0.7–4.0)
MCHC: 34.7 g/dL (ref 30.0–36.0)
MCV: 88.1 fl (ref 78.0–100.0)
MONO ABS: 0.3 10*3/uL (ref 0.1–1.0)
Monocytes Relative: 6.6 % (ref 3.0–12.0)
NEUTROS PCT: 61 % (ref 43.0–77.0)
Neutro Abs: 2.6 10*3/uL (ref 1.4–7.7)
PLATELETS: 242 10*3/uL (ref 150.0–400.0)
RBC: 4.46 Mil/uL (ref 3.87–5.11)
RDW: 13 % (ref 11.5–15.5)
WBC: 4.3 10*3/uL (ref 4.0–10.5)

## 2016-10-30 LAB — COMPREHENSIVE METABOLIC PANEL
ALK PHOS: 50 U/L (ref 39–117)
ALT: 26 U/L (ref 0–35)
AST: 30 U/L (ref 0–37)
Albumin: 4.4 g/dL (ref 3.5–5.2)
BUN: 13 mg/dL (ref 6–23)
CO2: 30 mEq/L (ref 19–32)
Calcium: 9.6 mg/dL (ref 8.4–10.5)
Chloride: 101 mEq/L (ref 96–112)
Creatinine, Ser: 0.6 mg/dL (ref 0.40–1.20)
GFR: 104.54 mL/min (ref >60.00)
Glucose, Bld: 100 mg/dL — ABNORMAL HIGH (ref 70–99)
POTASSIUM: 3.8 meq/L (ref 3.5–5.1)
SODIUM: 138 meq/L (ref 135–145)
TOTAL PROTEIN: 6.8 g/dL (ref 6.0–8.3)
Total Bilirubin: 0.7 mg/dL (ref 0.2–1.2)

## 2016-10-30 MED ORDER — ONDANSETRON 4 MG PO TBDP: 4.0000 mg | ORAL_TABLET | Freq: Three times a day (TID) | ORAL | 0 refills | Status: DC | PRN

## 2016-10-30 NOTE — Progress Notes (Signed)
Subjective:    Patient ID: Veronica Greene, female    DOB: 08-Apr-1946, 71 y.o.   MRN: [id number]  HPI  Veronica Greene is a 71 year old female with a history of constipation, GERD, IBS, melena who presents today with a chief complaint of diarrhea. She also reports nausea, gas-like pain, fatigue, and abdominal bloating. Her symptoms began with nausea 4 days ago. She ate at [employer] three days before her symptoms began. Overall the diarrhea has improved and is occurring 3-4 times daily after eating. She vomited once on Friday night, Saturday, and Sunday after eating. Her last episode of vomiting was Sunday. She denies bloody stools, fevers. She's tolerating small meals and small sips of water without vomiting, but has little appetite as nothing tastes good.   Review of Systems  Constitutional: Positive for fatigue. Negative for fever.  Gastrointestinal: Positive for abdominal pain, diarrhea, nausea and vomiting.       Past Medical History:  Diagnosis Date  . Allergic rhinitis   . Arthritis   . Cataracts, bilateral    early cataracts  . Depression with anxiety   . GERD (gastroesophageal reflux disease)   . Gout   . Heart murmur 2013  . Hypertension   . IBS (irritable bowel syndrome)    with constipation  . Osteopenia   . Polio age 9 months   right leg weakness and 1.5 inches shorter than left leg.   . Sinusitis, chronic   . Urinary incontinence      Social History   Social History  . Marital status: Married    Spouse name: N/A  . Number of children: 1  . Years of education: N/A   Occupational History  . [employer]   Social History Main Topics  . Smoking status: Never Smoker  . Smokeless tobacco: Never Used  . Alcohol use No  . Drug use: No  . Sexual activity: Yes   Other Topics Concern  . Not on file   Social History Narrative  . No narrative on file    Past Surgical History:  Procedure Laterality Date  . ANKLE SURGERY Right 1956  . CARPAL TUNNEL  RELEASE Bilateral 1986, 1993  . COLONOSCOPY  11/09/2011  . COLONOSCOPY N/A 06/24/2015   Procedure: COLONOSCOPY;  Surgeon: Gatha Mayer, MD;  Location: [facility];  Service: Endoscopy;  Laterality: N/A;  . COLONOSCOPY W/ POLYPECTOMY  10/21/2009  . HAND SURGERY Bilateral 1999  . heart murmer  2014  . KNEE SURGERY Bilateral 1998   x2  . NASAL SINUS SURGERY  1976  . polio surgery.    . THUMB ARTHROSCOPY  1999  . TONSILLECTOMY  [id number]   71 years old    Family History  Problem Relation Age of Onset  . Kidney disease Mother   . Stroke Father   . Colon cancer Father 55  . Kidney disease Sister   . Cancer Paternal Aunt     COLON CA  . Colon cancer Paternal Aunt 29    Allergies  Allergen Reactions  . Clarithromycin     REACTION: throat swells  . Codeine     REACTION: nausea and vomiting  . Paroxetine     REACTION: doesn't work  . Penicillins     REACTION: whelps    Current Outpatient Prescriptions on File Prior to Visit  Medication Sig Dispense Refill  . Ascorbic Acid (VITAMIN C) 1000 MG tablet Take 1,000 mg by mouth daily.      Marland Kitchen  aspirin 81 MG EC tablet Take 1 tablet (81 mg total) by mouth daily. DO NOT TAKE AGAIN UNTIL 07/11/2015 30 tablet 12  . Calcium Carbonate-Vitamin D (CALCIUM-VITAMIN D) 500-200 MG-UNIT per tablet Take 1 tablet by mouth daily. Take 400 units daily     . cholecalciferol (VITAMIN D) 1000 UNITS tablet Take 1,000 Units by mouth daily.    Marland Kitchen conjugated estrogens (PREMARIN) vaginal cream Place 1 Applicatorful vaginally daily.    . cyclobenzaprine (FLEXERIL) 10 MG tablet Take 10 mg by mouth as needed for muscle spasms. Reported on 06/23/2015    . Dextromethorphan-Guaifenesin (MUCUS-DM MAX) 60-1200 MG TB12 Take by mouth.    . esomeprazole (NEXIUM) 40 MG capsule Take 1 capsule (40 mg total) by mouth 2 (two) times daily. 60 capsule 11  . fluticasone (FLONASE) 50 MCG/ACT nasal spray Place into both nostrils daily.    . hydrochlorothiazide (HYDRODIURIL) 25 MG tablet  Take 1 tablet (25 mg total) by mouth daily. Reported on 06/23/2015 90 tablet 3  . HYDROcodone-acetaminophen (NORCO/VICODIN) 5-325 MG tablet Take 1 tablet by mouth as needed for moderate pain. Reported on 06/23/2015    . lubiprostone (AMITIZA) 24 MCG capsule TAKE 1 CAPSULE BY MOUTH TWICE A DAY. 180 capsule 0  . meloxicam (MOBIC) 7.5 MG tablet Take 1 tablet (7.5 mg total) by mouth daily after breakfast. 30 tablet 5  . Misc Natural Products (OSTEO BI-FLEX JOINT SHIELD PO) Take by mouth.    . Multiple Vitamin (MULTIVITAMIN) tablet Take 1 tablet by mouth daily.      . potassium chloride (K-DUR) 10 MEQ tablet Take 2 tablets (20 mEq total) by mouth 2 (two) times daily. 360 tablet 3  . sennosides-docusate sodium (SENOKOT-S) 8.6-50 MG tablet Take 1 tablet by mouth daily.    . Simethicone (PHAZYME PO) Take by mouth.    . triamcinolone (KENALOG) 0.025 % cream Apply 1 application topically 2 (two) times daily. 30 g 0  . venlafaxine XR (EFFEXOR XR) 75 MG 24 hr capsule Take 1 capsule (75 mg total) by mouth daily. 90 capsule 3  . colchicine 0.6 MG tablet Take 1 tablet (0.6 mg total) by mouth 2 (two) times daily. (Patient not taking: Reported on 10/30/2016) 60 tablet 2  . meclizine (ANTIVERT) 25 MG tablet Take 1 tablet (25 mg total) by mouth 3 (three) times daily as needed for dizziness (caution of sedation). (Patient not taking: Reported on 10/30/2016) 30 tablet 5   No current facility-administered medications on file prior to visit.     BP 136/74   Pulse 69   Temp 97.8 F (36.6 C) (Oral)   Ht 5\' 4"  (1.626 m)   Wt 184 lb 6.4 oz (83.6 kg)   SpO2 98%   BMI 31.65 kg/m    Objective:   Physical Exam  Constitutional: She appears well-nourished. She does not appear ill.  Neck: Neck supple.  Cardiovascular: Normal rate and regular rhythm.   Pulmonary/Chest: Effort normal and breath sounds normal.  Abdominal: Soft. Normal appearance. Bowel sounds are increased. There is generalized tenderness.  Generalized  tenderness, most of which to RUQ.  Skin: Skin is warm.          Assessment & Plan:  Viral Gastroenteritis:  N/V/D x 4 days, generalized abdominal pain. Overall feeling better and able to tolerate PO intake. Exam today representing viral GI infection, did note that RUQ was more tender than other sections. She did not appear sickly, vitals stable. Discussed that symptoms should continue to improve. Rx for Zofran  provided in order to allow for hydration and to advance diet. BRAT diet handout provided. Check CBC and CMP given RUQ tenderness. She will update if no improvement by Friday this week. Consider RUQ ultrasound to rule out gall bladder involvement if no improvement.  Sheral Flow, NP

## 2016-10-30 NOTE — Patient Instructions (Signed)
Complete lab work prior to leaving today. I will notify you of your results once received.   You may take the (ondansetron) Zofran tablets every 8 hours as needed for nausea.   Please notify me if your pain/symptoms do not improve on Thursday this week.   Ensure you are staying hydrated with water. Advance your diet as tolerated.  It was a pleasure to see you today!   Food Choices to Help Relieve Diarrhea, Adult When you have diarrhea, the foods you eat and your eating habits are very important. Choosing the right foods and drinks can help:  Relieve diarrhea.  Replace lost fluids and nutrients.  Prevent dehydration. What general guidelines should I follow? Relieving diarrhea   Choose foods with less than 2 g or .07 oz. of fiber per serving.  Limit fats to less than 8 tsp (38 g or 1.34 oz.) a day.  Avoid the following:  Foods and beverages sweetened with high-fructose corn syrup, honey, or sugar alcohols such as xylitol, sorbitol, and mannitol.  Foods that contain a lot of fat or sugar.  Fried, greasy, or spicy foods.  High-fiber grains, breads, and cereals.  Raw fruits and vegetables.  Eat foods that are rich in probiotics. These foods include dairy products such as yogurt and fermented milk products. They help increase healthy bacteria in the stomach and intestines (gastrointestinal tract, or GI tract).  If you have lactose intolerance, avoid dairy products. These may make your diarrhea worse.  Take medicine to help stop diarrhea (antidiarrheal medicine) only as told by your health care provider. Replacing nutrients   Eat small meals or snacks every 3-4 hours.  Eat bland foods, such as white rice, toast, or baked potato, until your diarrhea starts to get better. Gradually reintroduce nutrient-rich foods as tolerated or as told by your health care provider. This includes:  Well-cooked protein foods.  Peeled, seeded, and soft-cooked fruits and vegetables.  Low-fat  dairy products.  Take vitamin and mineral supplements as told by your health care provider. Preventing dehydration    Start by sipping water or a special solution to prevent dehydration (oral rehydration solution, ORS). Urine that is clear or pale yellow means that you are getting enough fluid.  Try to drink at least 8-10 cups of fluid each day to help replace lost fluids.  You may add other liquids in addition to water, such as clear juice or decaffeinated sports drinks, as tolerated or as told by your health care provider.  Avoid drinks with caffeine, such as coffee, tea, or soft drinks.  Avoid alcohol. What foods are recommended? The items listed may not be a complete list. Talk with your health care provider about what dietary choices are best for you. Grains  White rice. White, [place], or pita breads (fresh or toasted), including plain rolls, buns, or bagels. White pasta. Saltine, soda, or graham crackers. Pretzels. Low-fiber cereal. Cooked cereals made with water (such as cornmeal, farina, or cream cereals). Plain muffins. Matzo. Melba toast. Zwieback. Vegetables  Potatoes (without the skin). Most well-cooked and canned vegetables without skins or seeds. Tender lettuce. Fruits  Apple sauce. Fruits canned in juice. Cooked apricots, cherries, grapefruit, peaches, pears, or plums. Fresh bananas and cantaloupe. Meats and other protein foods  Baked or boiled chicken. Eggs. Tofu. Fish. Seafood. Smooth nut butters. Ground or well-cooked tender beef, ham, veal, lamb, pork, or poultry. Dairy  Plain yogurt, kefir, and unsweetened liquid yogurt. Lactose-free milk, buttermilk, skim milk, or soy milk. Low-fat or nonfat hard  cheese. Beverages  Water. Low-calorie sports drinks. Fruit juices without pulp. Strained tomato and vegetable juices. Decaffeinated teas. Sugar-free beverages not sweetened with sugar alcohols. Oral rehydration solutions, if approved by your health care provider. Seasoning  and other foods  Bouillon, broth, or soups made from recommended foods. What foods are not recommended? The items listed may not be a complete list. Talk with your health care provider about what dietary choices are best for you. Grains  Whole grain, whole wheat, bran, or rye breads, rolls, pastas, and crackers. Wild or brown rice. Whole grain or bran cereals. Barley. Oats and oatmeal. Corn tortillas or taco shells. Granola. Popcorn. Vegetables  Raw vegetables. Fried vegetables. Cabbage, broccoli, Brussels sprouts, artichokes, baked beans, beet greens, corn, kale, legumes, peas, sweet potatoes, and yams. Potato skins. Cooked spinach and cabbage. Fruits  Dried fruit, including raisins and dates. Raw fruits. Stewed or dried prunes. Canned fruits with syrup. Meat and other protein foods  Fried or fatty meats. Deli meats. Chunky nut butters. Nuts and seeds. Beans and lentils. Berniece Salines. Hot dogs. Sausage. Dairy  High-fat cheeses. Whole milk, chocolate milk, and beverages made with milk, such as milk shakes. Half-and-half. Cream. sour cream. Ice cream. Beverages  Caffeinated beverages (such as coffee, tea, soda, or energy drinks). Alcoholic beverages. Fruit juices with pulp. Prune juice. Soft drinks sweetened with high-fructose corn syrup or sugar alcohols. High-calorie sports drinks. Fats and oils  Butter. Cream sauces. Margarine. Salad oils. Plain salad dressings. Olives. Avocados. Mayonnaise. Sweets and desserts  Sweet rolls, doughnuts, and sweet breads. Sugar-free desserts sweetened with sugar alcohols such as xylitol and sorbitol. Seasoning and other foods  Honey. Hot sauce. Chili powder. Gravy. Cream-based or milk-based soups. Pancakes and waffles. Summary  When you have diarrhea, the foods you eat and your eating habits are very important.  Make sure you get at least 8-10 cups of fluid each day, or enough to keep your urine clear or pale yellow.  Eat bland foods and gradually reintroduce  healthy, nutrient-rich foods as tolerated, or as told by your health care provider.  Avoid high-fiber, fried, greasy, or spicy foods. This information is not intended to replace advice given to you by your health care provider. Make sure you discuss any questions you have with your health care provider. Document Released: 09/15/2003 Document Revised: 06/22/2016 Document Reviewed: 06/22/2016 Elsevier Interactive Patient Education  2017 [employer].

## 2016-10-30 NOTE — Progress Notes (Signed)
Pre visit review using our clinic review tool, if applicable. No additional management support is needed unless otherwise documented below in the visit note. 

## 2016-11-02 ENCOUNTER — Other Ambulatory Visit (INDEPENDENT_AMBULATORY_CARE_PROVIDER_SITE_OTHER): Payer: PPO

## 2016-11-02 ENCOUNTER — Ambulatory Visit (INDEPENDENT_AMBULATORY_CARE_PROVIDER_SITE_OTHER): Payer: PPO

## 2016-11-02 VITALS — BP 124/70 | HR 60 | Temp 97.1°F | Ht 65.5 in | Wt 184.2 lb

## 2016-11-02 DIAGNOSIS — Z1159 Encounter for screening for other viral diseases: Secondary | ICD-10-CM | POA: Diagnosis not present

## 2016-11-02 DIAGNOSIS — Z0000 Encounter for general adult medical examination without abnormal findings: Secondary | ICD-10-CM

## 2016-11-02 LAB — LIPID PANEL
CHOLESTEROL: 153 mg/dL (ref 0–200)
HDL: 37.7 mg/dL — AB (ref >39.00)
LDL Cholesterol: 90 mg/dL (ref 0–99)
NonHDL: 114.9
Total CHOL/HDL Ratio: 4
Triglycerides: 127 mg/dL (ref 0.0–149.0)
VLDL: 25.4 mg/dL (ref 0.0–40.0)

## 2016-11-02 LAB — TSH: TSH: 0.99 u[IU]/mL (ref 0.35–4.50)

## 2016-11-02 NOTE — Progress Notes (Signed)
Subjective:   Veronica Greene is a 71 y.o. female who presents for Medicare Annual (Subsequent) preventive examination.  Review of Systems:  N/A Cardiac Risk Factors include: advanced age (>29men, >31 women);obesity (BMI >30kg/m2);dyslipidemia     Objective:     Vitals: BP 124/70 (BP Location: Right Arm, Patient Position: Sitting, Cuff Size: Normal)   Pulse 60   Temp 97.1 F (36.2 C) (Oral)   Ht 5' 5.5" (1.664 m) Comment: no shoes  Wt 184 lb 4 oz (83.6 kg)   SpO2 96%   BMI 30.19 kg/m   Body mass index is 30.19 kg/m.   Tobacco History  Smoking Status  . Never Smoker  Smokeless Tobacco  . Never Used     Counseling given: No   Past Medical History:  Diagnosis Date  . Allergic rhinitis   . Arthritis   . Cataracts, bilateral    early cataracts  . Depression with anxiety   . GERD (gastroesophageal reflux disease)   . Gout   . Heart murmur 2013  . Hypertension   . IBS (irritable bowel syndrome)    with constipation  . Osteopenia   . Polio age 20 months   right leg weakness and 1.5 inches shorter than left leg.   . Sinusitis, chronic   . Urinary incontinence    Past Surgical History:  Procedure Laterality Date  . ANKLE SURGERY Right 1956  . CARPAL TUNNEL RELEASE Bilateral 1986, 1993  . COLONOSCOPY  11/09/2011  . COLONOSCOPY N/A 06/24/2015   Procedure: COLONOSCOPY;  Surgeon: Gatha Mayer, MD;  Location: [facility];  Service: Endoscopy;  Laterality: N/A;  . COLONOSCOPY W/ POLYPECTOMY  10/21/2009  . HAND SURGERY Bilateral 1999  . heart murmer  2014  . KNEE SURGERY Bilateral 1998   x2  . NASAL SINUS SURGERY  1976  . polio surgery.    . THUMB ARTHROSCOPY  1999  . TONSILLECTOMY  [id number]   71 years old   Family History  Problem Relation Age of Onset  . Kidney disease Mother   . Stroke Father   . Colon cancer Father 53  . Kidney disease Sister   . Cancer Paternal Aunt     COLON CA  . Colon cancer Paternal Aunt 35   History  Sexual Activity  . Sexual  activity: Yes    Outpatient Encounter Prescriptions as of 11/02/2016  Medication Sig  . Ascorbic Acid (VITAMIN C) 1000 MG tablet Take 1,000 mg by mouth daily.    Marland Kitchen aspirin 81 MG EC tablet Take 1 tablet (81 mg total) by mouth daily. DO NOT TAKE AGAIN UNTIL 07/11/2015  . Calcium Carbonate-Vitamin D (CALCIUM-VITAMIN D) 500-200 MG-UNIT per tablet Take 1 tablet by mouth daily. Take 400 units daily   . Cetirizine HCl (ZYRTEC ALLERGY PO) Take 10 mg by mouth as needed.  . cholecalciferol (VITAMIN D) 1000 UNITS tablet Take 1,000 Units by mouth daily.  Marland Kitchen conjugated estrogens (PREMARIN) vaginal cream Place 1 Applicatorful vaginally daily.  . cyclobenzaprine (FLEXERIL) 10 MG tablet Take 10 mg by mouth as needed for muscle spasms. Reported on 06/23/2015  . Dextromethorphan-Guaifenesin (MUCUS-DM MAX) 60-1200 MG TB12 Take by mouth.  . esomeprazole (NEXIUM) 40 MG capsule Take 1 capsule (40 mg total) by mouth 2 (two) times daily.  . fluticasone (FLONASE) 50 MCG/ACT nasal spray Place into both nostrils daily.  . hydrochlorothiazide (HYDRODIURIL) 25 MG tablet TAKE 1 TABLET BY MOUTH DAILY.  Marland Kitchen HYDROcodone-acetaminophen (NORCO/VICODIN) 5-325 MG tablet Take 1 tablet by  mouth as needed for moderate pain. Reported on 06/23/2015  . lubiprostone (AMITIZA) 24 MCG capsule TAKE 1 CAPSULE BY MOUTH TWICE A DAY.  . meloxicam (MOBIC) 7.5 MG tablet Take 1 tablet (7.5 mg total) by mouth daily after breakfast.  . Misc Natural Products (OSTEO BI-FLEX JOINT SHIELD PO) Take by mouth.  . Multiple Vitamin (MULTIVITAMIN) tablet Take 1 tablet by mouth daily.    . ondansetron (ZOFRAN ODT) 4 MG disintegrating tablet Take 1 tablet (4 mg total) by mouth every 8 (eight) hours as needed for nausea or vomiting.  . potassium chloride (K-DUR) 10 MEQ tablet Take 2 tablets (20 mEq total) by mouth 2 (two) times daily.  . sennosides-docusate sodium (SENOKOT-S) 8.6-50 MG tablet Take 1 tablet by mouth daily.  . Simethicone (PHAZYME PO) Take by mouth.    . triamcinolone (KENALOG) 0.025 % cream Apply 1 application topically 2 (two) times daily.  Marland Kitchen venlafaxine XR (EFFEXOR XR) 75 MG 24 hr capsule Take 1 capsule (75 mg total) by mouth daily.  . colchicine 0.6 MG tablet Take 1 tablet (0.6 mg total) by mouth 2 (two) times daily. (Patient not taking: Reported on 10/30/2016)  . meclizine (ANTIVERT) 25 MG tablet Take 1 tablet (25 mg total) by mouth 3 (three) times daily as needed for dizziness (caution of sedation). (Patient not taking: Reported on 10/30/2016)   No facility-administered encounter medications on file as of 11/02/2016.     Activities of Daily Living In your present state of health, do you have any difficulty performing the following activities: 11/02/2016  Hearing? Y  Vision? N  Difficulty concentrating or making decisions? N  Walking or climbing stairs? Y  Dressing or bathing? N  Doing errands, shopping? N  Preparing Food and eating ? N  Using the Toilet? N  In the past six months, have you accidently leaked urine? N  Do you have problems with loss of bowel control? N  Managing your Medications? N  Managing your Finances? N  Housekeeping or managing your Housekeeping? N  Some recent data might be hidden    Patient Care Team: Abner Greenspan, MD as PCP - [place], DPM as Consulting Physician (Podiatry) Ninetta Lights, MD as Consulting Physician (Orthopedic Surgery) Rutherford Guys, MD as Consulting Physician (Ophthalmology) Jerrell Belfast, MD as Consulting Physician (Otolaryngology) Lavella Hammock, DDS as Consulting Physician (Dentistry)    Assessment:    Hearing Screening Comments: Bilateral hearing aids Vision Screening Comments: Last vision exam in Dec 2017 with Dr. 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 Increase physical activity          Target weight loss is 10 lbs. Starting 11/02/2016, I will continue to decrease intake of simple carbohydrates to 1 serving per day.       Fall Risk Fall Risk  11/02/2016 11/01/2015 10/25/2015 10/12/2014 09/30/2013  Falls in the past year? Yes Yes Yes Yes Yes  Number falls in past yr: 2 or more 2 or more 1 2 or more 2 or more  Injury with Fall? No No No Yes -  Risk Factor Category  - High Fall Risk - - High Fall Risk  Risk for fall due to : - - - - Impaired balance/gait  Follow up - Falls evaluation completed;Education provided - - -  Depression Screen PHQ 2/9 Scores 11/02/2016 11/01/2015 10/25/2015 10/12/2014  PHQ - 2 Score 0 0 0 1     Cognitive Function MMSE - Mini Mental State Exam 11/02/2016 11/01/2015  Orientation to time 5 5  Orientation to Place 5 5  Registration 3 3  Attention/ Calculation 0 0  Recall 3 3  Language- name 2 objects 0 0  Language- repeat 1 1  Language- follow 3 step command 3 3  Language- read & follow direction 0 0  Write a sentence 0 0  Copy design 0 0  Total score 20 20     PLEASE NOTE: A Mini-Cog screen was completed. Maximum score is 20. A value of 0 denotes this part of Folstein MMSE was not completed or the patient failed this part of the Mini-Cog screening.   Mini-Cog Screening Orientation to Time - Max 5 pts Orientation to Place - Max 5 pts Registration - Max 3 pts Recall - Max 3 pts Language Repeat - Max 1 pts Language Follow 3 Step Command - Max 3 pts     Immunization History  Administered Date(s) Administered  . Influenza Whole 04/08/2008, 06/30/2009  . Influenza, High Dose Seasonal PF 05/17/2015, 05/07/2016  . Influenza-Unspecified 05/09/2014  . Pneumococcal Conjugate-13 10/12/2014  . Pneumococcal Polysaccharide-23 08/25/2010  . Td 10/15/2003  . Zoster 08/12/2008   Screening Tests Health Maintenance  Topic Date Due  . TETANUS/TDAP  11/05/2016 (Originally 10/14/2013)  .  INFLUENZA VACCINE  02/06/2017  . MAMMOGRAM  05/03/2017  . COLONOSCOPY  06/23/2017  . DEXA SCAN  Completed  . Hepatitis C Screening  Completed  . PNA vac Low Risk Adult  Completed      Plan:     I have personally reviewed and addressed the Medicare Annual Wellness questionnaire and have noted the following in the patient's chart:  A. Medical and social history B. Use of alcohol, tobacco or illicit drugs  C. Current medications and supplements D. Functional ability and status E.  Nutritional status F.  Physical activity G. Advance directives H. List of other physicians I.  Hospitalizations, surgeries, and ER visits in previous 12 months J.  [place] to include hearing, vision, cognitive, depression L. Referrals and appointments - none  In addition, I have reviewed and discussed with patient certain preventive protocols, quality metrics, and best practice recommendations. A written personalized care plan for preventive services as well as general preventive health recommendations were provided to patient.  See attached scanned questionnaire for additional information.   Signed,   Lindell Noe, MHA, BS, LPN Health Coach

## 2016-11-02 NOTE — Patient Instructions (Signed)
Veronica Greene , Thank you for taking time to come for your Medicare Wellness Visit. I appreciate your ongoing commitment to your health goals. Please review the following plan we discussed and let me know if I can assist you in the future.   These are the goals we discussed: Goals    . Increase physical activity          Target weight loss is 10 lbs. Starting 11/02/2016, I will continue to decrease intake of simple carbohydrates to 1 serving per day.        This is a list of the screening recommended for you and due dates:  Health Maintenance  Topic Date Due  . Tetanus Vaccine  11/05/2016*  . Flu Shot  02/06/2017  . Mammogram  05/03/2017  . Colon Cancer Screening  06/23/2017  . DEXA scan (bone density measurement)  Completed  .  Hepatitis C: One time screening is recommended by Center for Disease Control  (CDC) for  adults born from [birth year] through 1966.   Completed  . Pneumonia vaccines  Completed  *Topic was postponed. The date shown is not the original due date.   Preventive Care for Adults  A healthy lifestyle and preventive care can promote health and wellness. Preventive health guidelines for adults include the following key practices.  . A routine yearly physical is a good way to check with your health care provider about your health and preventive screening. It is a chance to share any concerns and updates on your health and to receive a thorough exam.  . Visit your dentist for a routine exam and preventive care every 6 months. Brush your teeth twice a day and floss once a day. Good oral hygiene prevents tooth decay and gum disease.  . The frequency of eye exams is based on your age, health, family medical history, use  of contact lenses, and other factors. Follow your health care provider's ecommendations for frequency of eye exams.  . Eat a healthy diet. Foods like vegetables, fruits, whole grains, low-fat dairy products, and lean protein foods contain the nutrients you need  without too many calories. Decrease your intake of foods high in solid fats, added sugars, and salt. Eat the right amount of calories for you. Get information about a proper diet from your health care provider, if necessary.  . Regular physical exercise is one of the most important things you can do for your health. Most adults should get at least 150 minutes of moderate-intensity exercise (any activity that increases your heart rate and causes you to sweat) each week. In addition, most adults need muscle-strengthening exercises on 2 or more days a week.  Silver Sneakers may be a benefit available to you. To determine eligibility, you may visit the website: www.silversneakers.com or contact program at 508-315-4851 Mon-Fri between 8AM-8PM.   . Maintain a healthy weight. The body mass index (BMI) is a screening tool to identify possible weight problems. It provides an estimate of body fat based on height and weight. Your health care provider can find your BMI and can help you achieve or maintain a healthy weight.   For adults 20 years and older: ? A BMI below 18.5 is considered underweight. ? A BMI of 18.5 to 24.9 is normal. ? A BMI of 25 to 29.9 is considered overweight. ? A BMI of 30 and above is considered obese.   . Maintain normal blood lipids and cholesterol levels by exercising and minimizing your intake of saturated fat. Eat  a balanced diet with plenty of fruit and vegetables. Blood tests for lipids and cholesterol should begin at age 28 and be repeated every 5 years. If your lipid or cholesterol levels are high, you are over 50, or you are at high risk for heart disease, you may need your cholesterol levels checked more frequently. Ongoing high lipid and cholesterol levels should be treated with medicines if diet and exercise are not working.  . If you smoke, find out from your health care provider how to quit. If you do not use tobacco, please do not start.  . If you choose to drink  alcohol, please do not consume more than 2 drinks per day. One drink is considered to be 12 ounces (355 mL) of beer, 5 ounces (148 mL) of wine, or 1.5 ounces (44 mL) of liquor.  . If you are 63-2 years old, ask your health care provider if you should take aspirin to prevent strokes.  . Use sunscreen. Apply sunscreen liberally and repeatedly throughout the day. You should seek shade when your shadow is shorter than you. Protect yourself by wearing long sleeves, pants, a wide-brimmed hat, and sunglasses year round, whenever you are outdoors.  . Once a month, do a whole body skin exam, using a mirror to look at the skin on your back. Tell your health care provider of new moles, moles that have irregular borders, moles that are larger than a pencil eraser, or moles that have changed in shape or color.

## 2016-11-02 NOTE — Progress Notes (Signed)
PCP notes:   Health maintenance:  Tetanus - addressed; investigation being completed as to whether pt can do vaccine with PCP or with pharmacy  Hep C screening - completed  Abnormal screenings:   Fall risk - hx of multiple falls without injury  Patient concerns:   None  Nurse concerns:  None  Next PCP appt:   11/05/16 @ 1030  I reviewed health advisor's note, was available for consultation, and agree with documentation and plan. Loura Pardon MD

## 2016-11-02 NOTE — Progress Notes (Signed)
Pre visit review using our clinic review tool, if applicable. No additional management support is needed unless otherwise documented below in the visit note. 

## 2016-11-02 NOTE — Progress Notes (Signed)
Several phone calls were made to [facility], [place], and to the patient regarding tetanus vaccine.  Per [facility] Concierge Cecille Rubin, patient has coverage for a tetanus vaccine that is listed on formulary list through Part D benefits.   Per [facility] pharmacist Bee, they carry Adacel brand of vaccine. However, it was not determined if they carry the Adacel that is listed on the formulary.  Patient was contacted and provided this information. Patient was asked to go to pharmacy and have insurance checked prior to getting TD vaccine. She was asked to contact PCP if there was a prescription needed. Patient verbalized understanding.  LPinson, LPN

## 2016-11-02 NOTE — Addendum Note (Signed)
Addended by: Ellamae Sia on: 11/02/2016 08:58 AM   Modules accepted: Orders

## 2016-11-03 LAB — HEPATITIS C ANTIBODY: HCV AB: NEGATIVE

## 2016-11-05 ENCOUNTER — Ambulatory Visit (INDEPENDENT_AMBULATORY_CARE_PROVIDER_SITE_OTHER): Payer: PPO | Admitting: Family Medicine

## 2016-11-05 ENCOUNTER — Encounter: Payer: Self-pay | Admitting: Family Medicine

## 2016-11-05 VITALS — BP 110/60 | HR 70 | Temp 98.1°F | Ht 65.5 in | Wt 184.5 lb

## 2016-11-05 DIAGNOSIS — E6609 Other obesity due to excess calories: Secondary | ICD-10-CM | POA: Diagnosis not present

## 2016-11-05 DIAGNOSIS — Z6830 Body mass index (BMI) 30.0-30.9, adult: Secondary | ICD-10-CM

## 2016-11-05 DIAGNOSIS — E2839 Other primary ovarian failure: Secondary | ICD-10-CM | POA: Diagnosis not present

## 2016-11-05 DIAGNOSIS — E785 Hyperlipidemia, unspecified: Secondary | ICD-10-CM

## 2016-11-05 DIAGNOSIS — M8580 Other specified disorders of bone density and structure, unspecified site: Secondary | ICD-10-CM

## 2016-11-05 DIAGNOSIS — Z0000 Encounter for general adult medical examination without abnormal findings: Secondary | ICD-10-CM | POA: Diagnosis not present

## 2016-11-05 MED ORDER — HYDROCHLOROTHIAZIDE 25 MG PO TABS: 25.0000 mg | ORAL_TABLET | Freq: Every day | ORAL | 3 refills | Status: DC

## 2016-11-05 MED ORDER — VENLAFAXINE HCL ER 75 MG PO CP24: 75.0000 mg | ORAL_CAPSULE | Freq: Every day | ORAL | 3 refills | Status: DC

## 2016-11-05 MED ORDER — MELOXICAM 7.5 MG PO TABS: 7.5000 mg | ORAL_TABLET | Freq: Every day | ORAL | 3 refills | Status: DC

## 2016-11-05 MED ORDER — POTASSIUM CHLORIDE ER 10 MEQ PO TBCR: 20.0000 meq | EXTENDED_RELEASE_TABLET | Freq: Two times a day (BID) | ORAL | 3 refills | Status: DC

## 2016-11-05 MED ORDER — LUBIPROSTONE 24 MCG PO CAPS: ORAL_CAPSULE | ORAL | 3 refills | Status: DC

## 2016-11-05 NOTE — Assessment & Plan Note (Signed)
Reviewed health habits including diet and exercise and skin cancer prevention Reviewed appropriate screening tests for age  Also reviewed health mt list, fam hx and immunization status , as well as social and family history   See HPI AMW reviewed  Labs reviewed She plans to get tdap at pharmacy  Would consider shingrix if covered  Colonoscopy due in dec-pt aware  Enc exercise for lower HDL

## 2016-11-05 NOTE — Progress Notes (Signed)
Pre visit review using our clinic review tool, if applicable. No additional management support is needed unless otherwise documented below in the visit note. 

## 2016-11-05 NOTE — Progress Notes (Signed)
Subjective:    Patient ID: Veronica Greene, female    DOB: Jan 10, 1946, 71 y.o.   MRN: [id number]  HPI Here for health maintenance exam and to review chronic medical problems    Had a bad case of gastroenteritis (seen/given zofran) Having a hard time to get back to normal  She got constipated for a while after wards  Trying to drink enough fluids  Feels better today than yesterday (sat was terrible)   She got married 5 mo ago!  She is very happy !- and married life is good  A lot of outdoor work when she is feeling - lifting a lot of pine needles /helping husband with work  Getting strong! Getting shots (lubricant) in her knees -helpful     Wt Readings from Last 3 Encounters:  11/05/16 184 lb 8 oz (83.7 kg)  11/02/16 184 lb 4 oz (83.6 kg)  10/30/16 184 lb 6.4 oz (83.6 kg)  stable  Eating very healthy (before she got sick)  bmi 30.2  Had her AMW visit 4/27 Hx of falls/ disc fall prev  Addressed tetanus imm status - will get at a pharmacy when feeling better  Hep C screening completed and rev  Other imms utd Zoster 2/10 vaccine    Mammogram 10/17 neg Self breast exam - no breast lumps  She is trying to cut down on coffee   Pap with gyn neg 7/17- she was having some spotting at the time - poss from applicator for her est cream / had an [place]   colonosocpy 12/16 (with 2 polypectomy ulcers)- has to have another one this year    dexa 10/13-in the normal range  No fractures  She wants to do another dexa  1000 iu vit D daily /and some calcium   Hx of hyperlipidemia Lab Results  Component Value Date   CHOL 153 11/02/2016   CHOL 181 10/18/2015   CHOL 165 10/05/2014   Lab Results  Component Value Date   HDL 37.70 (L) 11/02/2016   HDL 54.10 10/18/2015   HDL 47.70 10/05/2014   Lab Results  Component Value Date   LDLCALC 90 11/02/2016   LDLCALC 91 10/18/2015   LDLCALC 90 10/05/2014   Lab Results  Component Value Date   TRIG 127.0 11/02/2016   TRIG 178.0 (H)  10/18/2015   TRIG 135.0 10/05/2014   Lab Results  Component Value Date   CHOLHDL 4 11/02/2016   CHOLHDL 3 10/18/2015   CHOLHDL 3 10/05/2014   No results found for: LDLDIRECT   HDL is down  Triglycerides are improved    Results for orders placed or performed in visit on 11/02/16  Hepatitis C antibody  Result Value Ref Range   HCV Ab NEGATIVE NEGATIVE     Chemistry      Component Value Date/Time   NA 138 10/30/2016 1207   K 3.8 10/30/2016 1207   CL 101 10/30/2016 1207   CO2 30 10/30/2016 1207   BUN 13 10/30/2016 1207   CREATININE 0.60 10/30/2016 1207      Component Value Date/Time   CALCIUM 9.6 10/30/2016 1207   ALKPHOS 50 10/30/2016 1207   AST 30 10/30/2016 1207   ALT 26 10/30/2016 1207   BILITOT 0.7 10/30/2016 1207     Lab Results  Component Value Date   WBC 4.3 10/30/2016   HGB 13.6 10/30/2016   HCT 39.3 10/30/2016   MCV 88.1 10/30/2016   PLT 242.0 10/30/2016   Lab Results  Component  Value Date   TSH 0.99 11/02/2016    Glucose 100    Take effexor for mood  Doing well    Patient Active Problem List   Diagnosis Date Noted  . Estrogen deficiency 11/05/2016  . Routine general medical examination at a health care facility 10/15/2015  . BPV (benign positional vertigo) 06/18/2014  . Sinus congestion 06/18/2014  . Encounter for Medicare annual wellness exam 09/30/2013  . Back pain 02/16/2013  . Contact dermatitis 10/30/2012  . Hyperlipidemia, mild 09/02/2012  . Other screening mammogram 12/12/2011  . Degenerative disk disease 08/31/2011  . Hypokalemia 01/02/2011  . Personal history of colonic polyps 11/03/2010  . Constipation, slow transit 11/03/2010  . Benign neoplasm of colon 11/03/2010  . Melanosis coli 11/03/2010  . Gout 08/25/2010  . DERMATITIS, SEBORRHEIC 08/25/2010  . Osteopenia 09/05/2007  . History of poliomyelitis 08/26/2007  . Obesity 08/26/2007  . ANXIETY 08/26/2007  . DEPRESSION 08/26/2007  . ALLERGIC RHINITIS 08/26/2007  . GERD  08/26/2007  . IBS 08/26/2007  . OVERACTIVE BLADDER 08/26/2007  . OSTEOARTHRITIS, HANDS, BILATERAL 08/26/2007  . FIBROMYALGIA 08/26/2007  . DIZZINESS OR VERTIGO 08/26/2007  . EDEMA, CHRONIC 08/26/2007  . URINARY INCONTINENCE 08/26/2007  . HEMATURIA, HX OF 08/26/2007   Past Medical History:  Diagnosis Date  . Allergic rhinitis   . Arthritis   . Cataracts, bilateral    early cataracts  . Depression with anxiety   . GERD (gastroesophageal reflux disease)   . Gout   . Heart murmur 2013  . Hypertension   . IBS (irritable bowel syndrome)    with constipation  . Osteopenia   . Polio age 18 months   right leg weakness and 1.5 inches shorter than left leg.   . Sinusitis, chronic   . Urinary incontinence    Past Surgical History:  Procedure Laterality Date  . ANKLE SURGERY Right 1956  . CARPAL TUNNEL RELEASE Bilateral 1986, 1993  . COLONOSCOPY  11/09/2011  . COLONOSCOPY N/A 06/24/2015   Procedure: COLONOSCOPY;  Surgeon: Gatha Mayer, MD;  Location: [facility];  Service: Endoscopy;  Laterality: N/A;  . COLONOSCOPY W/ POLYPECTOMY  10/21/2009  . HAND SURGERY Bilateral 1999  . heart murmer  2014  . KNEE SURGERY Bilateral 1998   x2  . NASAL SINUS SURGERY  1976  . polio surgery.    . THUMB ARTHROSCOPY  1999  . TONSILLECTOMY  2978   71 years old   Social History  Substance Use Topics  . Smoking status: Never Smoker  . Smokeless tobacco: Never Used  . Alcohol use No   Family History  Problem Relation Age of Onset  . Kidney disease Mother   . Stroke Father   . Colon cancer Father 54  . Kidney disease Sister   . Cancer Paternal Aunt     COLON CA  . Colon cancer Paternal Aunt 32   Allergies  Allergen Reactions  . Clarithromycin     REACTION: throat swells  . Codeine     REACTION: nausea and vomiting  . Paroxetine     REACTION: doesn't work  . Penicillins     REACTION: whelps   Current Outpatient Prescriptions on File Prior to Visit  Medication Sig Dispense  Refill  . Ascorbic Acid (VITAMIN C) 1000 MG tablet Take 1,000 mg by mouth daily.      Marland Kitchen aspirin 81 MG EC tablet Take 1 tablet (81 mg total) by mouth daily. DO NOT TAKE AGAIN UNTIL 07/11/2015 30 tablet 12  .  Calcium Carbonate-Vitamin D (CALCIUM-VITAMIN D) 500-200 MG-UNIT per tablet Take 1 tablet by mouth daily. Take 400 units daily     . Cetirizine HCl (ZYRTEC ALLERGY PO) Take 10 mg by mouth as needed.    . cholecalciferol (VITAMIN D) 1000 UNITS tablet Take 1,000 Units by mouth daily.    . colchicine 0.6 MG tablet Take 1 tablet (0.6 mg total) by mouth 2 (two) times daily. (Patient taking differently: Take 0.6 mg by mouth 2 (two) times daily as needed. ) 60 tablet 2  . conjugated estrogens (PREMARIN) vaginal cream Place 1 Applicatorful vaginally once a week.     . cyclobenzaprine (FLEXERIL) 10 MG tablet Take 10 mg by mouth as needed for muscle spasms. Reported on 06/23/2015    . Dextromethorphan-Guaifenesin (MUCUS-DM MAX) 60-1200 MG TB12 Take by mouth.    . esomeprazole (NEXIUM) 40 MG capsule Take 1 capsule (40 mg total) by mouth 2 (two) times daily. 60 capsule 11  . fluticasone (FLONASE) 50 MCG/ACT nasal spray Place into both nostrils daily.    Marland Kitchen HYDROcodone-acetaminophen (NORCO/VICODIN) 5-325 MG tablet Take 1 tablet by mouth as needed for moderate pain. Reported on 06/23/2015    . meclizine (ANTIVERT) 25 MG tablet Take 1 tablet (25 mg total) by mouth 3 (three) times daily as needed for dizziness (caution of sedation). 30 tablet 5  . Misc Natural Products (OSTEO BI-FLEX JOINT SHIELD PO) Take by mouth.    . Multiple Vitamin (MULTIVITAMIN) tablet Take 1 tablet by mouth daily.      . ondansetron (ZOFRAN ODT) 4 MG disintegrating tablet Take 1 tablet (4 mg total) by mouth every 8 (eight) hours as needed for nausea or vomiting. 20 tablet 0  . sennosides-docusate sodium (SENOKOT-S) 8.6-50 MG tablet Take 1 tablet by mouth daily.    . Simethicone (PHAZYME PO) Take by mouth.    . triamcinolone (KENALOG) 0.025  % cream Apply 1 application topically 2 (two) times daily. 30 g 0   No current facility-administered medications on file prior to visit.     Review of Systems Review of Systems  Constitutional: Negative for fever, appetite change, fatigue and unexpected weight change.  Eyes: Negative for pain and visual disturbance.  Respiratory: Negative for cough and shortness of breath.   Cardiovascular: Negative for cp or palpitations    Gastrointestinal: Negative for nausea, and pos for diarrhea and constipation. (recent GI illness- gradually improving) Genitourinary: Negative for urgency and frequency.  Skin: Negative for pallor or rash   Neurological: Negative for weakness, light-headedness, numbness and headaches.  Hematological: Negative for adenopathy. Does not bruise/bleed easily.  Psychiatric/Behavioral: Negative for dysphoric mood. The patient is not nervous/anxious.         Objective:   Physical Exam  Constitutional: She appears well-developed and well-nourished. No distress.  obese and well appearing   HENT:  Head: Normocephalic and atraumatic.  Right Ear: External ear normal.  Left Ear: External ear normal.  Mouth/Throat: Oropharynx is clear and moist.  Eyes: Conjunctivae and EOM are normal. Pupils are equal, round, and reactive to light. No scleral icterus.  Neck: Normal range of motion. Neck supple. No JVD present. Carotid bruit is not present. No thyromegaly present.  Cardiovascular: Normal rate, regular rhythm, normal heart sounds and intact distal pulses.  Exam reveals no gallop.   Pulmonary/Chest: Effort normal and breath sounds normal. No respiratory distress. She has no wheezes. She exhibits no tenderness.  Abdominal: Soft. Bowel sounds are normal. She exhibits no distension, no abdominal bruit and no  mass. There is no tenderness.  Musculoskeletal: Normal range of motion. She exhibits no edema or tenderness.  No kyphosis   Baseline leg length discrepancy   Lymphadenopathy:     She has no cervical adenopathy.  Neurological: She is alert. She has normal reflexes. No cranial nerve deficit. She exhibits normal muscle tone. Coordination normal.  Skin: Skin is warm and dry. No rash noted. No erythema. No pallor.  Psychiatric: She has a normal mood and affect.          Assessment & Plan:   Problem List Items Addressed This Visit      Musculoskeletal and Integument   Osteopenia - Primary    Last dexa improved with nl bmd 5 y ago  dexa ordered now  Disc need for calcium/ vitamin D/ wt bearing exercise and bone density test every 2 y to monitor Disc safety/ fracture risk in detail   She is high fall risk but no fractures         Other   Estrogen deficiency   Relevant Orders   DG Bone Density   Hyperlipidemia, mild    Disc goals for lipids and reasons to control them Rev labs with pt Rev low sat fat diet in detail Enc exercise to raise HDL      Relevant Medications   hydrochlorothiazide (HYDRODIURIL) 25 MG tablet   Obesity    Discussed how this problem influences overall health and the risks it imposes  Reviewed plan for weight loss with lower calorie diet (via better food choices and also portion control or program like weight watchers) and exercise building up to or more than 30 minutes 5 days per week including some aerobic activity         Routine general medical examination at a health care facility    Reviewed health habits including diet and exercise and skin cancer prevention Reviewed appropriate screening tests for age  Also reviewed health mt list, fam hx and immunization status , as well as social and family history   See HPI AMW reviewed  Labs reviewed She plans to get tdap at pharmacy  Would consider shingrix if covered  Colonoscopy due in dec-pt aware  Enc exercise for lower HDL

## 2016-11-05 NOTE — Assessment & Plan Note (Addendum)
Last dexa improved with nl bmd 5 y ago  dexa ordered now  Disc need for calcium/ vitamin D/ wt bearing exercise and bone density test every 2 y to monitor Disc safety/ fracture risk in detail   She is high fall risk but no fractures

## 2016-11-05 NOTE — Assessment & Plan Note (Signed)
Discussed how this problem influences overall health and the risks it imposes  Reviewed plan for weight loss with lower calorie diet (via better food choices and also portion control or program like weight watchers) and exercise building up to or more than 30 minutes 5 days per week including some aerobic activity    

## 2016-11-05 NOTE — Patient Instructions (Addendum)
Goal- to work up to 64 oz of fluids per day   Get your tetanus shot at a pharmacy when you feel better   Shingrix - is the new shingles vaccine  If you are interested in a shingles/zoster vaccine - call your insurance to check on coverage,( you should not get it within 1 month of other vaccines) , then call us for a prescription  for it to take to a pharmacy that gives the shot , or make a nurse visit to get it here depending on your coverage  Don't forget to get your colonoscopy in December / call us if you need help scheduling it   Stop at check out for referral for dexa (bone density test)

## 2016-11-05 NOTE — Assessment & Plan Note (Signed)
Disc goals for lipids and reasons to control them Rev labs with pt Rev low sat fat diet in detail Enc exercise to raise HDL

## 2016-11-06 DIAGNOSIS — M170 Bilateral primary osteoarthritis of knee: Secondary | ICD-10-CM | POA: Diagnosis not present

## 2016-11-30 DIAGNOSIS — L570 Actinic keratosis: Secondary | ICD-10-CM | POA: Diagnosis not present

## 2016-11-30 DIAGNOSIS — L821 Other seborrheic keratosis: Secondary | ICD-10-CM | POA: Diagnosis not present

## 2016-11-30 DIAGNOSIS — L308 Other specified dermatitis: Secondary | ICD-10-CM | POA: Diagnosis not present

## 2016-12-13 ENCOUNTER — Ambulatory Visit (INDEPENDENT_AMBULATORY_CARE_PROVIDER_SITE_OTHER): Payer: PPO | Admitting: Podiatry

## 2016-12-13 ENCOUNTER — Ambulatory Visit (INDEPENDENT_AMBULATORY_CARE_PROVIDER_SITE_OTHER): Payer: PPO

## 2016-12-13 DIAGNOSIS — M25579 Pain in unspecified ankle and joints of unspecified foot: Secondary | ICD-10-CM

## 2016-12-13 DIAGNOSIS — M779 Enthesopathy, unspecified: Secondary | ICD-10-CM | POA: Diagnosis not present

## 2016-12-13 DIAGNOSIS — M722 Plantar fascial fibromatosis: Secondary | ICD-10-CM | POA: Diagnosis not present

## 2016-12-13 MED ORDER — TRIAMCINOLONE ACETONIDE 10 MG/ML IJ SUSP
10.0000 mg | Freq: Once | INTRAMUSCULAR | Status: AC
  Administered 2016-12-13: 10 mg

## 2016-12-13 NOTE — Progress Notes (Signed)
Subjective:    Patient ID: Veronica Greene, female   DOB: 71 y.o.   MRN: [id number]   HPI patient presents stating she's getting pain in the back of her right heel and that also in her left ankle is becoming sore    ROS      Objective:  Physical Exam neurovascular status intact with posterior plantar pain of the right fascia at discomfort in the left ankle joint with inflammation fluid buildup     Assessment:    Fasciitis right with lesion formation and capsulitis of the ankle left     Plan:    H&P condition reviewed and careful plantar fascial injection administered right and debridement of lesion and on the left I injected the capsule 3 mg Kenalog 5 mg Xylocaine and reappoint as needed

## 2016-12-17 ENCOUNTER — Ambulatory Visit: Payer: PPO | Admitting: Podiatry

## 2017-02-22 ENCOUNTER — Other Ambulatory Visit: Payer: Self-pay | Admitting: Family Medicine

## 2017-02-22 DIAGNOSIS — Z1231 Encounter for screening mammogram for malignant neoplasm of breast: Secondary | ICD-10-CM

## 2017-02-26 DIAGNOSIS — R300 Dysuria: Secondary | ICD-10-CM | POA: Diagnosis not present

## 2017-02-26 DIAGNOSIS — R350 Frequency of micturition: Secondary | ICD-10-CM | POA: Diagnosis not present

## 2017-02-27 DIAGNOSIS — H6123 Impacted cerumen, bilateral: Secondary | ICD-10-CM | POA: Diagnosis not present

## 2017-02-27 DIAGNOSIS — L299 Pruritus, unspecified: Secondary | ICD-10-CM | POA: Diagnosis not present

## 2017-02-27 DIAGNOSIS — J310 Chronic rhinitis: Secondary | ICD-10-CM | POA: Diagnosis not present

## 2017-02-27 DIAGNOSIS — J343 Hypertrophy of nasal turbinates: Secondary | ICD-10-CM | POA: Diagnosis not present

## 2017-02-27 DIAGNOSIS — J342 Deviated nasal septum: Secondary | ICD-10-CM | POA: Diagnosis not present

## 2017-02-27 DIAGNOSIS — H6122 Impacted cerumen, left ear: Secondary | ICD-10-CM | POA: Insufficient documentation

## 2017-04-15 DIAGNOSIS — R350 Frequency of micturition: Secondary | ICD-10-CM | POA: Diagnosis not present

## 2017-04-17 DIAGNOSIS — Z01411 Encounter for gynecological examination (general) (routine) with abnormal findings: Secondary | ICD-10-CM | POA: Diagnosis not present

## 2017-04-17 DIAGNOSIS — N950 Postmenopausal bleeding: Secondary | ICD-10-CM | POA: Diagnosis not present

## 2017-04-17 DIAGNOSIS — R102 Pelvic and perineal pain: Secondary | ICD-10-CM | POA: Diagnosis not present

## 2017-04-24 DIAGNOSIS — N950 Postmenopausal bleeding: Secondary | ICD-10-CM | POA: Diagnosis not present

## 2017-04-26 DIAGNOSIS — M170 Bilateral primary osteoarthritis of knee: Secondary | ICD-10-CM | POA: Diagnosis not present

## 2017-05-06 ENCOUNTER — Ambulatory Visit
Admission: RE | Admit: 2017-05-06 | Discharge: 2017-05-06 | Disposition: A | Discharge status: Home / Self Care | Payer: PPO | Source: Ambulatory Visit | Attending: Family Medicine | Admitting: Family Medicine

## 2017-05-06 DIAGNOSIS — Z1231 Encounter for screening mammogram for malignant neoplasm of breast: Secondary | ICD-10-CM

## 2017-05-06 DIAGNOSIS — E2839 Other primary ovarian failure: Secondary | ICD-10-CM

## 2017-05-06 DIAGNOSIS — Z13820 Encounter for screening for osteoporosis: Secondary | ICD-10-CM | POA: Diagnosis not present

## 2017-05-06 DIAGNOSIS — Z780 Asymptomatic menopausal state: Secondary | ICD-10-CM | POA: Diagnosis not present

## 2017-05-14 ENCOUNTER — Encounter: Payer: Self-pay | Admitting: *Deleted

## 2017-06-04 ENCOUNTER — Encounter: Payer: Self-pay | Admitting: Family Medicine

## 2017-06-04 ENCOUNTER — Ambulatory Visit (INDEPENDENT_AMBULATORY_CARE_PROVIDER_SITE_OTHER): Payer: PPO | Admitting: Family Medicine

## 2017-06-04 VITALS — BP 124/78 | HR 69 | Temp 97.8°F | Wt 187.0 lb

## 2017-06-04 DIAGNOSIS — M79621 Pain in right upper arm: Secondary | ICD-10-CM | POA: Insufficient documentation

## 2017-06-04 DIAGNOSIS — B029 Zoster without complications: Secondary | ICD-10-CM

## 2017-06-04 MED ORDER — GABAPENTIN 300 MG PO CAPS: 300.0000 mg | ORAL_CAPSULE | Freq: Two times a day (BID) | ORAL | 1 refills | Status: DC | PRN

## 2017-06-04 MED ORDER — VALACYCLOVIR HCL 1 G PO TABS: 1000.0000 mg | ORAL_TABLET | Freq: Three times a day (TID) | ORAL | 0 refills | Status: DC

## 2017-06-04 NOTE — Progress Notes (Addendum)
BP 124/78 (BP Location: Left Arm, Patient Position: Sitting, Cuff Size: Normal)   Pulse 69   Temp 97.8 F (36.6 C) (Oral)   Wt 187 lb (84.8 kg)   SpO2 98%   BMI 30.65 kg/m    CC: R shoulder pain Subjective:    Patient ID: Veronica Greene, female    DOB: 1945/09/01, 71 y.o.   MRN: [id number]  HPI: Veronica Greene is a 71 y.o. female presenting on 06/04/2017 for Shoulder Pain (posterior right shoulder. Fell against door jam and bruised right UE. Pain started 05/30/17. Felt some stinging and burning sensation. Now having more constant throbbing. Holding arm up helps relieve pain. Was taking Mobic every night, not helpful. Then took old rx ibuprofen 600 mg and Tylenol in between, helpful.  Also, heating pad has helped.)   DOI: 05/27/2017 Tripped over broom handle in camper, fell against wall landed on R upper arm. She did have a bruise but that has been improving. Next day saw chiropractor and had treatment with "thumper". Soreness from that. Over weekend had worsening shoulder pain. Describes severe pain across posterior shoulder with radiation to mid upper arm describes as burning and stinging and sharp stabbing that started on Saturday. Denies hand or arm numbness. Yesterday pain subsided some, returned last night with trouble sleeping, worst pain was this morning. Intermittent pain that comes and goes. Currently no pain.   Noted tender erythematous rash on arm Sunday - associated with burning pain at skin. She does endorse increased stress recently. She did previously receive zostavax 2010.   Ice worsened pain, heating pad helped.  Treating with ibuprofen 600mg  and tylenol with benefit.  Denies h/o shoulder problems.  Known cervical DDD.   Relevant past medical, surgical, family and social history reviewed and updated as indicated. Interim medical history since our last visit reviewed. Allergies and medications reviewed and updated. Outpatient Medications Prior to Visit  Medication Sig  Dispense Refill  . Ascorbic Acid (VITAMIN C) 1000 MG tablet Take 1,000 mg by mouth daily.      Marland Kitchen aspirin 81 MG EC tablet Take 1 tablet (81 mg total) by mouth daily. DO NOT TAKE AGAIN UNTIL 07/11/2015 30 tablet 12  . Calcium Carbonate-Vitamin D (CALCIUM-VITAMIN D) 500-200 MG-UNIT per tablet Take 1 tablet by mouth daily. Take 400 units daily     . Cetirizine HCl (ZYRTEC ALLERGY PO) Take 10 mg by mouth as needed.    . cholecalciferol (VITAMIN D) 1000 UNITS tablet Take 1,000 Units by mouth daily.    . colchicine 0.6 MG tablet Take 1 tablet (0.6 mg total) by mouth 2 (two) times daily. (Patient taking differently: Take 0.6 mg by mouth 2 (two) times daily as needed. ) 60 tablet 2  . conjugated estrogens (PREMARIN) vaginal cream Place 1 Applicatorful vaginally once a week.     . cyclobenzaprine (FLEXERIL) 10 MG tablet Take 10 mg by mouth as needed for muscle spasms. Reported on 06/23/2015    . Dextromethorphan-Guaifenesin (MUCUS-DM MAX) 60-1200 MG TB12 Take by mouth.    . esomeprazole (NEXIUM) 40 MG capsule Take 1 capsule (40 mg total) by mouth 2 (two) times daily. 60 capsule 11  . fluticasone (FLONASE) 50 MCG/ACT nasal spray Place into both nostrils daily.    . hydrochlorothiazide (HYDRODIURIL) 25 MG tablet Take 1 tablet (25 mg total) by mouth daily. 90 tablet 3  . HYDROcodone-acetaminophen (NORCO/VICODIN) 5-325 MG tablet Take 1 tablet by mouth as needed for moderate pain. Reported on 06/23/2015    .  lubiprostone (AMITIZA) 24 MCG capsule TAKE 1 CAPSULE BY MOUTH TWICE A DAY. 180 capsule 3  . meclizine (ANTIVERT) 25 MG tablet Take 1 tablet (25 mg total) by mouth 3 (three) times daily as needed for dizziness (caution of sedation). 30 tablet 5  . meloxicam (MOBIC) 7.5 MG tablet Take 1 tablet (7.5 mg total) by mouth daily after breakfast. 90 tablet 3  . Misc Natural Products (OSTEO BI-FLEX JOINT SHIELD PO) Take by mouth.    . Multiple Vitamin (MULTIVITAMIN) tablet Take 1 tablet by mouth daily.      .  ondansetron (ZOFRAN ODT) 4 MG disintegrating tablet Take 1 tablet (4 mg total) by mouth every 8 (eight) hours as needed for nausea or vomiting. 20 tablet 0  . potassium chloride (K-DUR) 10 MEQ tablet Take 2 tablets (20 mEq total) by mouth 2 (two) times daily. 360 tablet 3  . sennosides-docusate sodium (SENOKOT-S) 8.6-50 MG tablet Take 1 tablet by mouth daily.    . Simethicone (PHAZYME PO) Take by mouth.    . triamcinolone (KENALOG) 0.025 % cream Apply 1 application topically 2 (two) times daily. 30 g 0  . venlafaxine XR (EFFEXOR XR) 75 MG 24 hr capsule Take 1 capsule (75 mg total) by mouth daily. 90 capsule 3   No facility-administered medications prior to visit.      Per HPI unless specifically indicated in ROS section below Review of Systems     Objective:    BP 124/78 (BP Location: Left Arm, Patient Position: Sitting, Cuff Size: Normal)   Pulse 69   Temp 97.8 F (36.6 C) (Oral)   Wt 187 lb (84.8 kg)   SpO2 98%   BMI 30.65 kg/m   Wt Readings from Last 3 Encounters:  06/04/17 187 lb (84.8 kg)  11/05/16 184 lb 8 oz (83.7 kg)  11/02/16 184 lb 4 oz (83.6 kg)    Physical Exam  Constitutional: She appears well-developed and well-nourished. No distress.  Musculoskeletal: She exhibits no edema.  FROM cervical neck with pain to bilateral lateral flexion No significant midline cervical spine or paracervical mm tenderness No deformity of shoulders on inspection. Mild tenderness to palpation R anterior shoulder FROM in abduction and forward flexion. No pain or weakness with testing SITS in ext/int rotation. No pain with empty can sign. Neg Speed test. No impingement. No pain with rotation of humeral head in [facility] joint.   Skin: Skin is warm and dry. Rash noted. There is erythema.     Ecchymosis upper R arm at site of prior fall Erythematous papules R posterior shoulder with area of confluent erythema R lateral upper arm that is tender to palpation  Nursing note and vitals  reviewed.   Lab Results  Component Value Date   CREATININE 0.60 10/30/2016       Assessment & Plan:   Problem List Items Addressed This Visit    Pain of right upper arm    Shoulder exam largely benign. Although she does not have true vesicular rash, given typical description of pain and unilateral rash I think she is developing early herpes zoster. Treat accordingly with valtrex 1gm TID x 7 d course and start gabapentin for anticipated neuropathic pain. Update if not improving with treatment. Pt agrees with plan.        Other Visit Diagnoses    Herpes zoster without complication    -  Primary   Relevant Medications   valACYclovir (VALTREX) 1000 MG tablet      ADDENDUM ==> pt  requested ibuprofen refill. She does not take this with meloxicam.   Follow up plan: No Follow-up on file.  Ria Bush, MD

## 2017-06-04 NOTE — Assessment & Plan Note (Signed)
Shoulder exam largely benign. Although she does not have true vesicular rash, given typical description of pain and unilateral rash I think she is developing early herpes zoster. Treat accordingly with valtrex 1gm TID x 7 d course and start gabapentin for anticipated neuropathic pain. Update if not improving with treatment. Pt agrees with plan.

## 2017-06-04 NOTE — Patient Instructions (Signed)
I think this is shingles. Treat with valtrex sent to pharmacy and may use gabapentin as needed for nerve pain.   Shingles Shingles, which is also known as herpes zoster, is an infection that causes a painful skin rash and fluid-filled blisters. Shingles is not related to genital herpes, which is a sexually transmitted infection. Shingles only develops in people who:  Have had chickenpox.  Have received the chickenpox vaccine. (This is rare.)  What are the causes? Shingles is caused by varicella-zoster virus (VZV). This is the same virus that causes chickenpox. After exposure to VZV, the virus stays in the body in an inactive (dormant) state. Shingles develops if the virus reactivates. This can happen many years after the initial exposure to VZV. It is not known what causes this virus to reactivate. What increases the risk? People who have had chickenpox or received the chickenpox vaccine are at risk for shingles. Infection is more common in people who:  Are older than age 1.  Have a weakened defense (immune) system, such as those with HIV, AIDS, or cancer.  Are taking medicines that weaken the immune system, such as transplant medicines.  Are under great stress.  What are the signs or symptoms? Early symptoms of this condition include itching, tingling, and pain in an area on your skin. Pain may be described as burning, stabbing, or throbbing. A few days or weeks after symptoms start, a painful red rash appears, usually on one side of the body in a bandlike or beltlike pattern. The rash eventually turns into fluid-filled blisters that break open, scab over, and dry up in about 2-3 weeks. At any time during the infection, you may also develop:  A fever.  Chills.  A headache.  An upset stomach.  How is this diagnosed? This condition is diagnosed with a skin exam. Sometimes, skin or fluid samples are taken from the blisters before a diagnosis is made. These samples are examined  under a microscope or sent to a lab for testing. How is this treated? There is no specific cure for this condition. Your health care provider will probably prescribe medicines to help you manage pain, recover more quickly, and avoid long-term problems. Medicines may include:  Antiviral drugs.  Anti-inflammatory drugs.  Pain medicines.  If the area involved is on your face, you may be referred to a specialist, such as an eye doctor (ophthalmologist) or an ear, nose, and throat (ENT) doctor to help you avoid eye problems, chronic pain, or disability. Follow these instructions at home: Medicines  Take medicines only as directed by your health care provider.  Apply an anti-itch or numbing cream to the affected area as directed by your health care provider. Blister and Rash Care  Take a cool bath or apply cool compresses to the area of the rash or blisters as directed by your health care provider. This may help with pain and itching.  Keep your rash covered with a loose bandage (dressing). Wear loose-fitting clothing to help ease the pain of material rubbing against the rash.  Keep your rash and blisters clean with mild soap and cool water or as directed by your health care provider.  Check your rash every day for signs of infection. These include redness, swelling, and pain that lasts or increases.  Do not pick your blisters.  Do not scratch your rash. General instructions  Rest as directed by your health care provider.  Keep all follow-up visits as directed by your health care provider. This is  important.  Until your blisters scab over, your infection can cause chickenpox in people who have never had it or been vaccinated against it. To prevent this from happening, avoid contact with other people, especially: ? Babies. ? Pregnant women. ? Children who have eczema. ? Elderly people who have transplants. ? People who have chronic illnesses, such as leukemia or AIDS. Contact a  health care provider if:  Your pain is not relieved with prescribed medicines.  Your pain does not get better after the rash heals.  Your rash looks infected. Signs of infection include redness, swelling, and pain that lasts or increases. Get help right away if:  The rash is on your face or nose.  You have facial pain, pain around your eye area, or loss of feeling on one side of your face.  You have ear pain or you have ringing in your ear.  You have loss of taste.  Your condition gets worse. This information is not intended to replace advice given to you by your health care provider. Make sure you discuss any questions you have with your health care provider. Document Released: 06/25/2005 Document Revised: 02/19/2016 Document Reviewed: 05/06/2014 Elsevier Interactive Patient Education  2017 [employer].

## 2017-06-05 ENCOUNTER — Telehealth: Payer: Self-pay

## 2017-06-05 DIAGNOSIS — B029 Zoster without complications: Secondary | ICD-10-CM | POA: Diagnosis not present

## 2017-06-05 DIAGNOSIS — L821 Other seborrheic keratosis: Secondary | ICD-10-CM | POA: Diagnosis not present

## 2017-06-05 DIAGNOSIS — D225 Melanocytic nevi of trunk: Secondary | ICD-10-CM | POA: Diagnosis not present

## 2017-06-05 DIAGNOSIS — L814 Other melanin hyperpigmentation: Secondary | ICD-10-CM | POA: Diagnosis not present

## 2017-06-05 DIAGNOSIS — L570 Actinic keratosis: Secondary | ICD-10-CM | POA: Diagnosis not present

## 2017-06-05 MED ORDER — IBUPROFEN 600 MG PO TABS: 600.0000 mg | ORAL_TABLET | Freq: Three times a day (TID) | ORAL | 0 refills | Status: DC | PRN

## 2017-06-05 NOTE — Telephone Encounter (Signed)
Pt is requesting new rx for ibuprofen 600 mg.

## 2017-06-05 NOTE — Addendum Note (Signed)
Addended by: Ria Bush on: 06/05/2017 10:11 AM   Modules accepted: Orders

## 2017-06-05 NOTE — Telephone Encounter (Signed)
Sent in

## 2017-06-05 NOTE — Telephone Encounter (Signed)
Spoke with pt notifying her rx was sent to pharmacy.  Expresses her thanks. 

## 2017-06-06 ENCOUNTER — Telehealth: Payer: Self-pay | Admitting: *Deleted

## 2017-06-06 DIAGNOSIS — B029 Zoster without complications: Secondary | ICD-10-CM

## 2017-06-06 NOTE — Telephone Encounter (Signed)
There is no official recommendation regarding this yet- but she should wait at least long enough for the rash to be totally gone/healed

## 2017-06-06 NOTE — Telephone Encounter (Signed)
Copied from [place]. Topic: General - Other >> Jun 06, 2017  9:42 AM Synthia Innocent wrote: Reason for CRM: Since she has the shingles, she would like to know when she could get the shingles injection

## 2017-06-07 MED ORDER — HYDROCODONE-ACETAMINOPHEN 5-325 MG PO TABS: 1.0000 | ORAL_TABLET | Freq: Four times a day (QID) | ORAL | 0 refills | Status: DC | PRN

## 2017-06-07 NOTE — Telephone Encounter (Signed)
Pt notified of Dr. Marliss Coots instructions and recommendations and verbalized understanding, pt advise Rx ready for pick up

## 2017-06-07 NOTE — Telephone Encounter (Signed)
Pt notified, pt wanted me to let Dr. Glori Bickers know that she is in sever pain due to the shingles. Pt said in the morning she can hardly get out of bed because the pain is so severe, pt wants to know if there is something else Dr. Glori Bickers can recommend that she take, ? If heat or ice will help also, please advise

## 2017-06-07 NOTE — Telephone Encounter (Signed)
The gabapentin that Dr Darnell Level gave her should help some   I will print norco- which she has had before  Warn of sedation/constipation and habit with this-use caution and also take with food  Hope it helps Px written for call in

## 2017-06-07 NOTE — Telephone Encounter (Signed)
Printed and in IN box  

## 2017-06-20 ENCOUNTER — Encounter: Payer: Self-pay | Admitting: Gastroenterology

## 2017-06-25 DIAGNOSIS — N9489 Other specified conditions associated with female genital organs and menstrual cycle: Secondary | ICD-10-CM | POA: Diagnosis not present

## 2017-06-25 DIAGNOSIS — R9389 Abnormal findings on diagnostic imaging of other specified body structures: Secondary | ICD-10-CM | POA: Diagnosis not present

## 2017-06-25 DIAGNOSIS — N950 Postmenopausal bleeding: Secondary | ICD-10-CM | POA: Diagnosis not present

## 2017-07-04 DIAGNOSIS — H524 Presbyopia: Secondary | ICD-10-CM | POA: Diagnosis not present

## 2017-07-13 ENCOUNTER — Other Ambulatory Visit: Payer: Self-pay | Admitting: Family Medicine

## 2017-07-15 NOTE — Telephone Encounter (Signed)
Last filled:  06/04/17, #21 Last OV:  06/04/17 Next OV:  11/11/17 with Dr. Glori Bickers

## 2017-07-23 ENCOUNTER — Ambulatory Visit (INDEPENDENT_AMBULATORY_CARE_PROVIDER_SITE_OTHER): Payer: PPO | Admitting: Family Medicine

## 2017-07-23 ENCOUNTER — Encounter: Payer: Self-pay | Admitting: Family Medicine

## 2017-07-23 VITALS — BP 125/65 | HR 59 | Temp 98.6°F | Ht 65.5 in | Wt 190.8 lb

## 2017-07-23 DIAGNOSIS — R609 Edema, unspecified: Secondary | ICD-10-CM | POA: Diagnosis not present

## 2017-07-23 DIAGNOSIS — Z01818 Encounter for other preprocedural examination: Secondary | ICD-10-CM | POA: Diagnosis not present

## 2017-07-23 DIAGNOSIS — R011 Cardiac murmur, unspecified: Secondary | ICD-10-CM | POA: Diagnosis not present

## 2017-07-23 DIAGNOSIS — Z01810 Encounter for preprocedural cardiovascular examination: Secondary | ICD-10-CM

## 2017-07-23 NOTE — Patient Instructions (Signed)
We will set up an echocardiogram to investigate a heart murmur EKG looks good  I do not forsee any problems with surgery  I will send notes to your gyn   Good luck with everything

## 2017-07-23 NOTE — Progress Notes (Signed)
Subjective:    Patient ID: Veronica Greene, female    DOB: 10-Sep-1945, 72 y.o.   MRN: [id number]  HPI Here for surgical clearance for upcoming hysteroscopy/D and C/Polypectomy  Surgery date is 08/09/17  Has an endometrial polyp  No bleeding right now (did bleed after first procedure) Planning general anesthesia   Wt Readings from Last 3 Encounters:  07/23/17 190 lb 12 oz (86.5 kg)  06/04/17 187 lb (84.8 kg)  11/05/16 184 lb 8 oz (83.7 kg)   31.26 kg/m   She takes HCTZ for chronic edema (takes it at lunch -has not taken it yet today)  Also potassium  BP Readings from Last 3 Encounters:  07/23/17 (!) 144/68  06/04/17 124/78  11/05/16 110/60    No cardiac hx  Never smoked  Post menopausal   Knows she has had a heart M in the past 2 y Never evaluated  EKG today NSR with rate of 69 and no acute changes   She did have shingles this fall   Allergic to clarithromycin -throat swells PCN-hives   codiene causes n/v - GI intolerance   Anesthesia history: did get nauseated once in the late 90s after gen anesth  No problems with local  No problems with twilight anesthesia    Patient Active Problem List   Diagnosis Date Noted  . Preoperative exam for gynecologic surgery 07/23/2017  . Heart murmur 07/23/2017  . Pain of right upper arm 06/04/2017  . Estrogen deficiency 11/05/2016  . Routine general medical examination at a health care facility 10/15/2015  . Encounter for Medicare annual wellness exam 09/30/2013  . Back pain 02/16/2013  . Hyperlipidemia, mild 09/02/2012  . Other screening mammogram 12/12/2011  . Degenerative disk disease 08/31/2011  . Hypokalemia 01/02/2011  . Personal history of colonic polyps 11/03/2010  . Constipation, slow transit 11/03/2010  . Benign neoplasm of colon 11/03/2010  . Melanosis coli 11/03/2010  . Gout 08/25/2010  . DERMATITIS, SEBORRHEIC 08/25/2010  . Osteopenia 09/05/2007  . History of poliomyelitis 08/26/2007  . Obesity 08/26/2007   . ANXIETY 08/26/2007  . DEPRESSION 08/26/2007  . ALLERGIC RHINITIS 08/26/2007  . GERD 08/26/2007  . IBS 08/26/2007  . OVERACTIVE BLADDER 08/26/2007  . OSTEOARTHRITIS, HANDS, BILATERAL 08/26/2007  . FIBROMYALGIA 08/26/2007  . EDEMA, CHRONIC 08/26/2007  . URINARY INCONTINENCE 08/26/2007  . HEMATURIA, HX OF 08/26/2007   Past Medical History:  Diagnosis Date  . Allergic rhinitis   . Arthritis   . Cataracts, bilateral    early cataracts  . Depression with anxiety   . GERD (gastroesophageal reflux disease)   . Gout   . Heart murmur 2013  . Hypertension   . IBS (irritable bowel syndrome)    with constipation  . Osteopenia   . Polio age 43 months   right leg weakness and 1.5 inches shorter than left leg.   . Sinusitis, chronic   . Urinary incontinence    Past Surgical History:  Procedure Laterality Date  . ANKLE SURGERY Right 1956  . CARPAL TUNNEL RELEASE Bilateral 1986, 1993  . COLONOSCOPY  11/09/2011  . COLONOSCOPY N/A 06/24/2015   Procedure: COLONOSCOPY;  Surgeon: Gatha Mayer, MD;  Location: [facility];  Service: Endoscopy;  Laterality: N/A;  . COLONOSCOPY W/ POLYPECTOMY  10/21/2009  . HAND SURGERY Bilateral 1999  . heart murmer  2014  . KNEE SURGERY Bilateral 1998   x2  . NASAL SINUS SURGERY  1976  . polio surgery.    . THUMB ARTHROSCOPY  1999  . TONSILLECTOMY  7261   72 years old   Social History   Tobacco Use  . Smoking status: Never Smoker  . Smokeless tobacco: Never Used  Substance Use Topics  . Alcohol use: No    Alcohol/week: 0.0 oz  . Drug use: No   Family History  Problem Relation Age of Onset  . Kidney disease Mother   . Stroke Father   . Colon cancer Father 41  . Kidney disease Sister   . Cancer Paternal Aunt        COLON CA  . Colon cancer Paternal Aunt 61   Allergies  Allergen Reactions  . Clarithromycin Swelling    throat swells  . Codeine Nausea And Vomiting  . Paroxetine Other (See Comments)    Ineffective.  Marland Kitchen Penicillins       Has patient had a PCN reaction causing immediate rash, facial/tongue/throat swelling, SOB or lightheadedness with hypotension: No Has patient had a PCN reaction causing severe rash involving mucus membranes or skin necrosis: Unknown Has patient had a PCN reaction that required hospitalization:No Has patient had a PCN reaction occurring within the last 10 years: No If all of the above answers are "NO", then may proceed with Cephalosporin use.    Current Outpatient Medications on File Prior to Visit  Medication Sig Dispense Refill  . Ascorbic Acid (VITAMIN C) 1000 MG tablet Take 1,000 mg by mouth daily.      . cholecalciferol (VITAMIN D) 1000 UNITS tablet Take 1,000 Units by mouth daily with breakfast.     . colchicine 0.6 MG tablet Take 1 tablet (0.6 mg total) by mouth 2 (two) times daily. (Patient taking differently: Take 0.6 mg by mouth 2 (two) times daily as needed (for gout flare ups.). ) 60 tablet 2  . conjugated estrogens (PREMARIN) vaginal cream Place 1 Applicatorful vaginally every Saturday.     . fluticasone (FLONASE) 50 MCG/ACT nasal spray Place 2 sprays into both nostrils at bedtime.     . hydrochlorothiazide (HYDRODIURIL) 25 MG tablet Take 1 tablet (25 mg total) by mouth daily. (Patient taking differently: Take 25 mg by mouth daily with lunch. ) 90 tablet 3  . lubiprostone (AMITIZA) 24 MCG capsule TAKE 1 CAPSULE BY MOUTH TWICE A DAY. (Patient taking differently: Take 24 mcg by mouth 2 (two) times daily. LUNCH & SUPPER) 180 capsule 3  . meloxicam (MOBIC) 7.5 MG tablet Take 1 tablet (7.5 mg total) by mouth daily after breakfast. (Patient taking differently: Take 7.5 mg by mouth at bedtime. ) 90 tablet 3  . Misc Natural Products (OSTEO BI-FLEX JOINT SHIELD PO) Take 1 tablet by mouth daily.     . potassium chloride (K-DUR) 10 MEQ tablet Take 2 tablets (20 mEq total) by mouth 2 (two) times daily. 360 tablet 3  . sennosides-docusate sodium (SENOKOT-S) 8.6-50 MG tablet Take 1 tablet by  mouth at bedtime.     . Simethicone (PHAZYME PO) Take 1 tablet by mouth 4 (four) times daily as needed (for gas (typically one tablet at night)).     . triamcinolone (KENALOG) 0.025 % cream Apply 1 application topically 2 (two) times daily. (Patient taking differently: Apply 1 application topically 2 (two) times daily as needed (for dry/itchy skin.). ) 30 g 0  . venlafaxine XR (EFFEXOR XR) 75 MG 24 hr capsule Take 1 capsule (75 mg total) by mouth daily. 90 capsule 3  . acetaminophen (TYLENOL 8 HOUR ARTHRITIS PAIN) 650 MG CR tablet Take 650 mg by  mouth at bedtime.    Marland Kitchen aspirin 81 MG EC tablet Take 1 tablet (81 mg total) by mouth daily. DO NOT TAKE AGAIN UNTIL 07/11/2015 (Patient taking differently: Take 81 mg by mouth at bedtime. ) 30 tablet 12  . Calcium-Vitamin D-Vitamin K (VIACTIV) 119-417-40 MG-UNT-MCG CHEW Chew 1 tablet by mouth 2 (two) times daily. Morning & afternoon    . cetirizine (ZYRTEC) 10 MG tablet Take 5 mg by mouth at bedtime as needed for allergies (alternates between mucinex & zyrtec for sinus symptoms).    Marland Kitchen esomeprazole (NEXIUM) 20 MG capsule Take 40 mg by mouth daily before breakfast.    . gabapentin (NEURONTIN) 300 MG capsule Take 1 capsule (300 mg total) by mouth 2 (two) times daily as needed (shingles pain). (Patient not taking: Reported on 07/23/2017) 60 capsule 1  . guaiFENesin (MUCINEX) 600 MG 12 hr tablet Take 600 mg by mouth at bedtime as needed (alternates between mucinex & zyrtec for sinus symptoms).    . Hydrocortisone-Acetic Acid (ACETIC ACID-HC DROPS OT) Place 1-2 drops in ear(s) at bedtime as needed (for itching).    Marland Kitchen ibuprofen (ADVIL,MOTRIN) 600 MG tablet Take 1 tablet (600 mg total) by mouth every 8 (eight) hours as needed. (Patient not taking: Reported on 07/23/2017) 30 tablet 0  . Multiple Vitamin (MULTIVITAMIN WITH MINERALS) TABS tablet Take 1 tablet by mouth daily. WOMEN'S MULTIVITAMIN    . Polyethyl Glycol-Propyl Glycol (SYSTANE) 0.4-0.3 % SOLN Place 1 drop into  both eyes 2 (two) times daily.    . sodium chloride (AYR) 0.65 % nasal spray Place 1 spray into the nose 4 (four) times daily as needed for congestion.     No current facility-administered medications on file prior to visit.     Review of Systems  Constitutional: Negative for activity change, appetite change, fatigue, fever and unexpected weight change.  HENT: Negative for congestion, ear pain, rhinorrhea, sinus pressure and sore throat.   Eyes: Negative for pain, redness and visual disturbance.  Respiratory: Negative for cough, shortness of breath and wheezing.   Cardiovascular: Negative for chest pain and palpitations.       H/o heart M without symptoms   Gastrointestinal: Negative for abdominal pain, blood in stool, constipation and diarrhea.  Endocrine: Negative for polydipsia and polyuria.  Genitourinary: Negative for dysuria, frequency and urgency.  Musculoskeletal: Negative for arthralgias, back pain and myalgias.  Skin: Negative for pallor and rash.  Allergic/Immunologic: Negative for environmental allergies.  Neurological: Negative for dizziness, syncope and headaches.  Hematological: Negative for adenopathy. Does not bruise/bleed easily.  Psychiatric/Behavioral: Negative for decreased concentration and dysphoric mood. The patient is not nervous/anxious.        Objective:   Physical Exam  Constitutional: She appears well-developed and well-nourished. No distress.  obese and well appearing   HENT:  Head: Normocephalic and atraumatic.  Mouth/Throat: Oropharynx is clear and moist.  Eyes: Conjunctivae and EOM are normal. Pupils are equal, round, and reactive to light.  Neck: Normal range of motion. Neck supple. No JVD present. Carotid bruit is not present. No thyromegaly present.  Cardiovascular: Normal rate, regular rhythm and intact distal pulses. Exam reveals no gallop.  Murmur heard. Systolic M heard at both sternal borders   Pulmonary/Chest: Effort normal and breath  sounds normal. No respiratory distress. She has no wheezes. She has no rales.  No crackles  Abdominal: Soft. Bowel sounds are normal. She exhibits no distension, no abdominal bruit and no mass. There is no tenderness.  No suprapubic tenderness  or fullness    Musculoskeletal: She exhibits no edema.  Lymphadenopathy:    She has no cervical adenopathy.  Neurological: She is alert. She has normal reflexes. No cranial nerve deficit. She exhibits normal muscle tone. Coordination normal.  Skin: Skin is warm and dry. No rash noted. No pallor.  Psychiatric: She has a normal mood and affect.          Assessment & Plan:   Problem List Items Addressed This Visit      Other   EDEMA, CHRONIC    On hctz  Well controlled  Rev last labs  BP: 125/65        Heart murmur    New per pt in past several years  Asymptomatic Systolic on exam  Send for echocardiogram       Relevant Orders   ECHOCARDIOGRAM COMPLETE   Preoperative exam for gynecologic surgery    Low risk  Rev past exp with anesthesia - nausea one time in the past  Rev med intol and allergies (all to clarithromycin and pcn) N/v with codeine  Reassuring exam  Checking out a non symptomatic heart m with echo  Will send note to gyn for endometrial polyp surgery          Other Visit Diagnoses    Pre-operative cardiovascular examination    -  Primary   Relevant Orders   EKG 12-Lead (Completed)

## 2017-07-24 NOTE — Assessment & Plan Note (Signed)
On hctz  Well controlled  Rev last labs  BP: 125/65

## 2017-07-24 NOTE — Assessment & Plan Note (Signed)
New per pt in past several years  Asymptomatic Systolic on exam  Send for echocardiogram

## 2017-07-24 NOTE — Patient Instructions (Addendum)
Your procedure is scheduled on:  Friday, Feb 1  Enter through the [employer] [facility] at:  11 am  Pick up the phone at the desk and dial [phone].  Call this number if you have problems the morning of surgery: [phone].  Remember: Do NOT eat or Do NOT drink clear liquids (including water) after midnight Thursday  Take these medicines the morning of surgery with a SIP OF WATER:  Nexium, effexor and colchincine if needed  Stop herbal medications and supplements 1 week prior to surgery.  Do NOT wear jewelry (body piercing), metal hair clips/bobby pins, make-up, or nail polish. Do NOT wear lotions, powders, or perfumes.  You may wear deoderant. Do NOT shave for 48 hours prior to surgery. Do NOT bring valuables to the hospital. Dentures may not be worn into surgery.  Have a responsible adult drive you home and stay with you for 24 hours after your procedure.  Home with husband Veronica Greene cell [phone].

## 2017-07-24 NOTE — Assessment & Plan Note (Signed)
Low risk  Rev past exp with anesthesia - nausea one time in the past  Rev med intol and allergies (all to clarithromycin and pcn) N/v with codeine  Reassuring exam  Checking out a non symptomatic heart m with echo  Will send note to gyn for endometrial polyp surgery

## 2017-07-29 ENCOUNTER — Other Ambulatory Visit (HOSPITAL_COMMUNITY): Payer: PPO

## 2017-07-30 ENCOUNTER — Ambulatory Visit (INDEPENDENT_AMBULATORY_CARE_PROVIDER_SITE_OTHER): Payer: PPO

## 2017-07-30 ENCOUNTER — Other Ambulatory Visit: Payer: Self-pay | Admitting: Obstetrics and Gynecology

## 2017-07-30 ENCOUNTER — Other Ambulatory Visit: Payer: Self-pay

## 2017-07-30 DIAGNOSIS — R350 Frequency of micturition: Secondary | ICD-10-CM | POA: Diagnosis not present

## 2017-07-30 DIAGNOSIS — N950 Postmenopausal bleeding: Secondary | ICD-10-CM | POA: Diagnosis not present

## 2017-07-30 DIAGNOSIS — R011 Cardiac murmur, unspecified: Secondary | ICD-10-CM | POA: Diagnosis not present

## 2017-07-30 DIAGNOSIS — N9489 Other specified conditions associated with female genital organs and menstrual cycle: Secondary | ICD-10-CM | POA: Diagnosis not present

## 2017-07-30 HISTORY — PX: TRANSTHORACIC ECHOCARDIOGRAM: SHX275

## 2017-07-31 ENCOUNTER — Other Ambulatory Visit: Payer: Self-pay

## 2017-07-31 ENCOUNTER — Encounter (HOSPITAL_COMMUNITY): Payer: Self-pay

## 2017-07-31 ENCOUNTER — Encounter (HOSPITAL_COMMUNITY)
Admission: RE | Admit: 2017-07-31 | Discharge: 2017-07-31 | Disposition: A | Discharge status: Home / Self Care | Payer: PPO | Source: Ambulatory Visit | Attending: Obstetrics and Gynecology | Admitting: Obstetrics and Gynecology

## 2017-07-31 DIAGNOSIS — Z841 Family history of disorders of kidney and ureter: Secondary | ICD-10-CM | POA: Diagnosis not present

## 2017-07-31 DIAGNOSIS — Z885 Allergy status to narcotic agent status: Secondary | ICD-10-CM | POA: Diagnosis not present

## 2017-07-31 DIAGNOSIS — Z01812 Encounter for preprocedural laboratory examination: Secondary | ICD-10-CM | POA: Insufficient documentation

## 2017-07-31 DIAGNOSIS — M8580 Other specified disorders of bone density and structure, unspecified site: Secondary | ICD-10-CM | POA: Diagnosis not present

## 2017-07-31 DIAGNOSIS — M797 Fibromyalgia: Secondary | ICD-10-CM | POA: Diagnosis not present

## 2017-07-31 DIAGNOSIS — Z800 Family history of malignant neoplasm of digestive organs: Secondary | ICD-10-CM | POA: Insufficient documentation

## 2017-07-31 DIAGNOSIS — Z79899 Other long term (current) drug therapy: Secondary | ICD-10-CM | POA: Insufficient documentation

## 2017-07-31 DIAGNOSIS — R609 Edema, unspecified: Secondary | ICD-10-CM | POA: Insufficient documentation

## 2017-07-31 DIAGNOSIS — F419 Anxiety disorder, unspecified: Secondary | ICD-10-CM | POA: Diagnosis not present

## 2017-07-31 DIAGNOSIS — F329 Major depressive disorder, single episode, unspecified: Secondary | ICD-10-CM | POA: Diagnosis not present

## 2017-07-31 DIAGNOSIS — K589 Irritable bowel syndrome without diarrhea: Secondary | ICD-10-CM | POA: Diagnosis not present

## 2017-07-31 DIAGNOSIS — K219 Gastro-esophageal reflux disease without esophagitis: Secondary | ICD-10-CM | POA: Insufficient documentation

## 2017-07-31 DIAGNOSIS — Z888 Allergy status to other drugs, medicaments and biological substances status: Secondary | ICD-10-CM | POA: Insufficient documentation

## 2017-07-31 DIAGNOSIS — Z79890 Hormone replacement therapy (postmenopausal): Secondary | ICD-10-CM | POA: Insufficient documentation

## 2017-07-31 DIAGNOSIS — Z880 Allergy status to penicillin: Secondary | ICD-10-CM | POA: Diagnosis not present

## 2017-07-31 DIAGNOSIS — Z791 Long term (current) use of non-steroidal anti-inflammatories (NSAID): Secondary | ICD-10-CM | POA: Diagnosis not present

## 2017-07-31 DIAGNOSIS — Z823 Family history of stroke: Secondary | ICD-10-CM | POA: Insufficient documentation

## 2017-07-31 DIAGNOSIS — E669 Obesity, unspecified: Secondary | ICD-10-CM | POA: Insufficient documentation

## 2017-07-31 DIAGNOSIS — N840 Polyp of corpus uteri: Secondary | ICD-10-CM | POA: Diagnosis not present

## 2017-07-31 DIAGNOSIS — Z9889 Other specified postprocedural states: Secondary | ICD-10-CM | POA: Insufficient documentation

## 2017-07-31 DIAGNOSIS — R011 Cardiac murmur, unspecified: Secondary | ICD-10-CM | POA: Insufficient documentation

## 2017-07-31 DIAGNOSIS — I10 Essential (primary) hypertension: Secondary | ICD-10-CM | POA: Diagnosis not present

## 2017-07-31 HISTORY — DX: Complete loss of teeth, unspecified cause, unspecified class: Z97.2

## 2017-07-31 HISTORY — DX: Complete loss of teeth, unspecified cause, unspecified class: K08.109

## 2017-07-31 HISTORY — DX: Fibromyalgia: M79.7

## 2017-07-31 HISTORY — DX: Unspecified hearing loss, unspecified ear: H91.90

## 2017-07-31 LAB — CBC WITH DIFFERENTIAL/PLATELET
BASOS ABS: 0 10*3/uL (ref 0.0–0.1)
BASOS PCT: 0 %
EOS PCT: 3 %
Eosinophils Absolute: 0.2 10*3/uL (ref 0.0–0.7)
HEMATOCRIT: 37.5 % (ref 36.0–46.0)
Hemoglobin: 13 g/dL (ref 12.0–15.0)
Lymphocytes Relative: 27 %
Lymphs Abs: 1.5 10*3/uL (ref 0.7–4.0)
MCH: 31.3 pg (ref 26.0–34.0)
MCHC: 34.7 g/dL (ref 30.0–36.0)
MCV: 90.1 fL (ref 78.0–100.0)
MONO ABS: 0.2 10*3/uL (ref 0.1–1.0)
MONOS PCT: 3 %
NEUTROS ABS: 3.8 10*3/uL (ref 1.7–7.7)
Neutrophils Relative %: 67 %
PLATELETS: 215 10*3/uL (ref 150–400)
RBC: 4.16 MIL/uL (ref 3.87–5.11)
RDW: 13 % (ref 11.5–15.5)
WBC: 5.7 10*3/uL (ref 4.0–10.5)

## 2017-07-31 LAB — BASIC METABOLIC PANEL
ANION GAP: 8 (ref 5–15)
BUN: 15 mg/dL (ref 6–20)
CALCIUM: 9.6 mg/dL (ref 8.9–10.3)
CO2: 26 mmol/L (ref 22–32)
Chloride: 105 mmol/L (ref 101–111)
Creatinine, Ser: 0.6 mg/dL (ref 0.44–1.00)
GFR calc Af Amer: >60 mL/min (ref >60)
GLUCOSE: 103 mg/dL — AB (ref 65–99)
Potassium: 3.9 mmol/L (ref 3.5–5.1)
Sodium: 139 mmol/L (ref 135–145)

## 2017-07-31 NOTE — Pre-Procedure Instructions (Signed)
Reviewed medical history, medications and EKG with Dr. Royce Macadamia.  Patient saw Dr Loura Pardon at [facility] on 07/23/17 for surgical clearance.  Dr Glori Bickers wanted patient to have an echo for heart murmur  Patient had echo done yesterday.  Results in Epic.  Patient has not heard from Dr Glori Bickers since procedure.  Dr Royce Macadamia reviewed echo results in [facility].  [place] for surgery.  No orders given.  Patient will follow up with Dr. Glori Bickers for echo results.

## 2017-08-09 ENCOUNTER — Encounter (HOSPITAL_COMMUNITY)
Admission: AD | Disposition: A | Discharge status: Home / Self Care | Payer: Self-pay | Source: Ambulatory Visit | Attending: Obstetrics and Gynecology

## 2017-08-09 ENCOUNTER — Ambulatory Visit (HOSPITAL_COMMUNITY): Payer: PPO | Admitting: Anesthesiology

## 2017-08-09 ENCOUNTER — Encounter (HOSPITAL_COMMUNITY): Payer: Self-pay

## 2017-08-09 ENCOUNTER — Other Ambulatory Visit: Payer: Self-pay | Admitting: Obstetrics and Gynecology

## 2017-08-09 ENCOUNTER — Ambulatory Visit (HOSPITAL_COMMUNITY)
Admission: AD | Admit: 2017-08-09 | Discharge: 2017-08-09 | Disposition: A | Discharge status: Home / Self Care | Payer: PPO | Source: Ambulatory Visit | Attending: Obstetrics and Gynecology | Admitting: Obstetrics and Gynecology

## 2017-08-09 ENCOUNTER — Other Ambulatory Visit: Payer: Self-pay

## 2017-08-09 DIAGNOSIS — B91 Sequelae of poliomyelitis: Secondary | ICD-10-CM | POA: Diagnosis not present

## 2017-08-09 DIAGNOSIS — C541 Malignant neoplasm of endometrium: Secondary | ICD-10-CM | POA: Diagnosis not present

## 2017-08-09 DIAGNOSIS — K219 Gastro-esophageal reflux disease without esophagitis: Secondary | ICD-10-CM | POA: Insufficient documentation

## 2017-08-09 DIAGNOSIS — F329 Major depressive disorder, single episode, unspecified: Secondary | ICD-10-CM | POA: Diagnosis not present

## 2017-08-09 DIAGNOSIS — Z79899 Other long term (current) drug therapy: Secondary | ICD-10-CM | POA: Diagnosis not present

## 2017-08-09 DIAGNOSIS — M797 Fibromyalgia: Secondary | ICD-10-CM | POA: Insufficient documentation

## 2017-08-09 DIAGNOSIS — N950 Postmenopausal bleeding: Secondary | ICD-10-CM | POA: Diagnosis present

## 2017-08-09 DIAGNOSIS — F419 Anxiety disorder, unspecified: Secondary | ICD-10-CM | POA: Diagnosis not present

## 2017-08-09 DIAGNOSIS — Z888 Allergy status to other drugs, medicaments and biological substances status: Secondary | ICD-10-CM | POA: Diagnosis not present

## 2017-08-09 DIAGNOSIS — N840 Polyp of corpus uteri: Secondary | ICD-10-CM | POA: Insufficient documentation

## 2017-08-09 DIAGNOSIS — I10 Essential (primary) hypertension: Secondary | ICD-10-CM | POA: Diagnosis not present

## 2017-08-09 DIAGNOSIS — Z7982 Long term (current) use of aspirin: Secondary | ICD-10-CM | POA: Diagnosis not present

## 2017-08-09 DIAGNOSIS — Z880 Allergy status to penicillin: Secondary | ICD-10-CM | POA: Diagnosis not present

## 2017-08-09 DIAGNOSIS — R9389 Abnormal findings on diagnostic imaging of other specified body structures: Secondary | ICD-10-CM | POA: Diagnosis not present

## 2017-08-09 DIAGNOSIS — R531 Weakness: Secondary | ICD-10-CM | POA: Diagnosis not present

## 2017-08-09 DIAGNOSIS — N858 Other specified noninflammatory disorders of uterus: Secondary | ICD-10-CM | POA: Diagnosis not present

## 2017-08-09 DIAGNOSIS — Z885 Allergy status to narcotic agent status: Secondary | ICD-10-CM | POA: Insufficient documentation

## 2017-08-09 DIAGNOSIS — N9489 Other specified conditions associated with female genital organs and menstrual cycle: Secondary | ICD-10-CM | POA: Diagnosis not present

## 2017-08-09 HISTORY — PX: DILATATION & CURETTAGE/HYSTEROSCOPY WITH MYOSURE: SHX6511

## 2017-08-09 SURGERY — DILATATION & CURETTAGE/HYSTEROSCOPY WITH MYOSURE
Anesthesia: General

## 2017-08-09 MED ORDER — FENTANYL CITRATE (PF) 250 MCG/5ML IJ SOLN
INTRAMUSCULAR | Status: AC
  Filled 2017-08-09: qty 5

## 2017-08-09 MED ORDER — IBUPROFEN 800 MG PO TABS: 800.0000 mg | ORAL_TABLET | Freq: Three times a day (TID) | ORAL | 0 refills | Status: DC | PRN

## 2017-08-09 MED ORDER — MIDAZOLAM HCL 2 MG/2ML IJ SOLN
INTRAMUSCULAR | Status: AC
  Filled 2017-08-09: qty 2

## 2017-08-09 MED ORDER — SODIUM CHLORIDE 0.9 % IR SOLN
Status: DC | PRN
  Administered 2017-08-09: 3000 mL

## 2017-08-09 MED ORDER — ONDANSETRON HCL 4 MG/2ML IJ SOLN
INTRAMUSCULAR | Status: AC
  Filled 2017-08-09: qty 2

## 2017-08-09 MED ORDER — DEXAMETHASONE SODIUM PHOSPHATE 10 MG/ML IJ SOLN
INTRAMUSCULAR | Status: DC | PRN
  Administered 2017-08-09: 4 mg via INTRAVENOUS

## 2017-08-09 MED ORDER — DEXAMETHASONE SODIUM PHOSPHATE 10 MG/ML IJ SOLN
INTRAMUSCULAR | Status: AC
  Filled 2017-08-09: qty 1

## 2017-08-09 MED ORDER — LIDOCAINE HCL (CARDIAC) 20 MG/ML IV SOLN
INTRAVENOUS | Status: AC
  Filled 2017-08-09: qty 5

## 2017-08-09 MED ORDER — ACETAMINOPHEN 500 MG PO TABS
ORAL_TABLET | ORAL | Status: AC
  Filled 2017-08-09: qty 2

## 2017-08-09 MED ORDER — PHENYLEPHRINE HCL 10 MG/ML IJ SOLN
INTRAMUSCULAR | Status: DC | PRN
  Administered 2017-08-09 (×2): 80 ug via INTRAVENOUS
  Administered 2017-08-09: 40 ug via INTRAVENOUS

## 2017-08-09 MED ORDER — BUPIVACAINE HCL (PF) 0.25 % IJ SOLN
INTRAMUSCULAR | Status: DC | PRN
  Administered 2017-08-09: 20 mL

## 2017-08-09 MED ORDER — LIDOCAINE HCL (CARDIAC) 20 MG/ML IV SOLN
INTRAVENOUS | Status: DC | PRN
  Administered 2017-08-09: 100 mg via INTRAVENOUS

## 2017-08-09 MED ORDER — BUPIVACAINE HCL (PF) 0.25 % IJ SOLN
INTRAMUSCULAR | Status: AC
  Filled 2017-08-09: qty 30

## 2017-08-09 MED ORDER — PHENYLEPHRINE 40 MCG/ML (10ML) SYRINGE FOR IV PUSH (FOR BLOOD PRESSURE SUPPORT)
PREFILLED_SYRINGE | INTRAVENOUS | Status: AC
  Filled 2017-08-09: qty 10

## 2017-08-09 MED ORDER — ONDANSETRON HCL 4 MG/2ML IJ SOLN
INTRAMUSCULAR | Status: DC | PRN
  Administered 2017-08-09: 4 mg via INTRAVENOUS

## 2017-08-09 MED ORDER — MIDAZOLAM HCL 5 MG/5ML IJ SOLN
INTRAMUSCULAR | Status: DC | PRN
  Administered 2017-08-09 (×2): 1 mg via INTRAVENOUS

## 2017-08-09 MED ORDER — PROPOFOL 10 MG/ML IV BOLUS
INTRAVENOUS | Status: DC | PRN
  Administered 2017-08-09: 30 mg via INTRAVENOUS
  Administered 2017-08-09 (×2): 50 mg via INTRAVENOUS

## 2017-08-09 MED ORDER — ACETAMINOPHEN 500 MG PO TABS
1000.0000 mg | ORAL_TABLET | Freq: Once | ORAL | Status: AC
Start: 2017-08-09 — End: 2017-08-09
  Administered 2017-08-09: 1000 mg via ORAL

## 2017-08-09 MED ORDER — FENTANYL CITRATE (PF) 100 MCG/2ML IJ SOLN
INTRAMUSCULAR | Status: DC | PRN
  Administered 2017-08-09 (×3): 50 ug via INTRAVENOUS

## 2017-08-09 MED ORDER — PROPOFOL 10 MG/ML IV BOLUS
INTRAVENOUS | Status: AC
  Filled 2017-08-09: qty 20

## 2017-08-09 MED ORDER — LACTATED RINGERS IV SOLN
INTRAVENOUS | Status: DC
  Administered 2017-08-09: 125 mL/h via INTRAVENOUS

## 2017-08-09 SURGICAL SUPPLY — 16 items
CANISTER SUCT 3000ML PPV (MISCELLANEOUS) ×6 IMPLANT
CATH ROBINSON RED A/P 16FR (CATHETERS) ×3 IMPLANT
DEVICE MYOSURE REACH (MISCELLANEOUS) ×3 IMPLANT
FILTER ARTHROSCOPY CONVERTOR (FILTER) ×3 IMPLANT
GLOVE BIOGEL M 6.5 STRL (GLOVE) ×6 IMPLANT
GLOVE BIOGEL PI IND STRL 6.5 (GLOVE) ×1 IMPLANT
GLOVE BIOGEL PI IND STRL 7.0 (GLOVE) ×1 IMPLANT
GLOVE BIOGEL PI INDICATOR 6.5 (GLOVE) ×2
GLOVE BIOGEL PI INDICATOR 7.0 (GLOVE) ×2
GOWN STRL REUS W/TWL LRG LVL3 (GOWN DISPOSABLE) ×6 IMPLANT
PACK VAGINAL MINOR WOMEN LF (CUSTOM PROCEDURE TRAY) ×3 IMPLANT
PAD OB MATERNITY 4.3X12.25 (PERSONAL CARE ITEMS) ×3 IMPLANT
SEAL ROD LENS SCOPE MYOSURE (ABLATOR) ×3 IMPLANT
TOWEL OR 17X24 6PK STRL BLUE (TOWEL DISPOSABLE) ×6 IMPLANT
TUBING AQUILEX INFLOW (TUBING) ×3 IMPLANT
TUBING AQUILEX OUTFLOW (TUBING) ×3 IMPLANT

## 2017-08-09 NOTE — Discharge Instructions (Signed)
°  Post Anesthesia Home Care Instructions  Activity: Get plenty of rest for the remainder of the day. A responsible individual must stay with you for 24 hours following the procedure.  For the next 24 hours, DO NOT: -Drive a car -Paediatric nurse -Drink alcoholic beverages -Take any medication unless instructed by your physician -Make any legal decisions or sign important papers.  Meals: Start with liquid foods such as gelatin or soup. Progress to regular foods as tolerated. Avoid greasy, spicy, heavy foods. If nausea and/or vomiting occur, drink only clear liquids until the nausea and/or vomiting subsides. Call your physician if vomiting continues.  Special Instructions/Symptoms: Your throat may feel dry or sore from the anesthesia or the breathing tube placed in your throat during surgery. If this causes discomfort, gargle with warm salt water. The discomfort should disappear within 24 hours.    DISCHARGE INSTRUCTIONS: HYSTEROSCOPY / ENDOMETRIAL ABLATION The following instructions have been prepared to help you care for yourself upon your return home.   Personal hygiene:  Use sanitary pads for vaginal drainage, not tampons.  Shower the day after your procedure.  NO tub baths, pools or Jacuzzis for 2-3 weeks.  Wipe front to back after using the bathroom.  Activity and limitations:  Do NOT drive or operate any equipment for 24 hours. The effects of anesthesia are still present and drowsiness may result.  Do NOT rest in bed all day.  Walking is encouraged.  Walk up and down stairs slowly.  You may resume your normal activity in one to two days or as indicated by your physician. Sexual activity: NO intercourse for at least 2 weeks after the procedure, or as indicated by your Doctor.  Diet: Eat a light meal as desired this evening. You may resume your usual diet tomorrow.  Return to Work: You may resume your work activities in one to two days or as indicated by  Marine scientist.  What to expect after your surgery: Expect to have vaginal bleeding/discharge for 2-3 days and spotting for up to 10 days. It is not unusual to have soreness for up to 1-2 weeks. You may have a slight burning sensation when you urinate for the first day. Mild cramps may continue for a couple of days. You may have a regular period in 2-6 weeks.  Call your doctor for any of the following:  Excessive vaginal bleeding or clotting, saturating and changing one pad every hour.  Inability to urinate 6 hours after discharge from hospital.  Pain not relieved by pain medication.  Fever of 100.4 F or greater.  Unusual vaginal discharge or odor.

## 2017-08-09 NOTE — H&P (Signed)
Date of Initial H&P:08/09/2016 History reviewed, patient examined, no change in status, stable for surgery.

## 2017-08-09 NOTE — H&P (Deleted)
  The note originally documented on this encounter has been moved the the encounter in which it belongs.  

## 2017-08-09 NOTE — Op Note (Addendum)
08/09/2017  1:23 PM  PATIENT:  Veronica Greene  72 y.o. female  PRE-OPERATIVE DIAGNOSIS:  R93.89 Thickened Endometrium N94.89 Endometrial mass N95.0 PMB  POST-OPERATIVE DIAGNOSIS:  R93.89 Thickened Endometrium N94.89 Endometrial mass N95.0 PMB  PROCEDURE:  Procedure(s) with comments: DILATATION & CURETTAGE/HYSTEROSCOPY WITH MYOSURE (N/A) - Polypectomy  SURGEON:  Surgeon(s) and Role:    Christophe Louis, MD - Primary  PHYSICIAN ASSISTANT: None  ASSISTANTS: none   ANESTHESIA:   general  EBL:  5 mL   BLOOD ADMINISTERED:none  DRAINS: none   LOCAL MEDICATIONS USED:  MARCAINE     SPECIMEN:  Source of Specimen:  endometrial polyp and currettings  DISPOSITION OF SPECIMEN:  PATHOLOGY  COUNTS:  YES  TOURNIQUET:  * No tourniquets in log *  DICTATION: .Dragon Dictation  PLAN OF CARE: Discharge to home after PACU  PATIENT DISPOSITION:  PACU - hemodynamically stable.   Delay start of Pharmacological VTE agent (>24hrs) due to surgical blood loss or risk of bleeding: not applicable   Procedure: Patient was taken to the operating room where she was placed under general anesthesia. She was placed in the dorsal lithotomy position. She was prepped and draped in the usual sterile fashion. A speculum was placed into the vaginal vault. The anterior lip of the cervix was grasped with a single-tooth tenaculum. Quarter percent Marcaine was injected at the 4 and 8:00 positions of the cervix. The cervix was then sounded to 6 cm. The cervix was dilated to approximately 6 mm. myosure operative  hysteroscope was inserted. The findings noted above. Myosure reach  blade was introduced throught the hysteroscope. The endometrial polyp was removed in 1 minute.  There was no evidence of perforation. Hysteroscope was then removed. Sharp curettage was performed. The single-tooth tenaculum was removed from the anterior lip of the . The speculum was removed from the patient's vagina. She was awakened from  anesthesia taken care  To the recovery  room awake and in stable condition. Sponge lap and needle counts were correct x2.   Findings: normal cervix . 7 mm mass on the posterior wall of the endometrium.Marland Kitchen

## 2017-08-09 NOTE — Anesthesia Procedure Notes (Signed)
Procedure Name: LMA Insertion Date/Time: 08/09/2017 12:38 PM Performed by: Ignacia Bayley, CRNA Pre-anesthesia Checklist: Patient identified, Emergency Drugs available, Suction available and Patient being monitored Patient Re-evaluated:Patient Re-evaluated prior to induction Oxygen Delivery Method: Circle system utilized Preoxygenation: Pre-oxygenation with 100% oxygen Induction Type: IV induction Ventilation: Mask ventilation without difficulty LMA: LMA inserted LMA Size: 4.0 Number of attempts: 1 Placement Confirmation: positive ETCO2 and breath sounds checked- equal and bilateral Tube secured with: Tape Dental Injury: Teeth and Oropharynx as per pre-operative assessment

## 2017-08-09 NOTE — Transfer of Care (Signed)
Immediate Anesthesia Transfer of Care Note  Patient: Veronica Greene  Procedure(s) Performed: DILATATION & CURETTAGE/HYSTEROSCOPY WITH MYOSURE (N/A )  Patient Location: PACU  Anesthesia Type:General  Level of Consciousness: sedated  Airway & Oxygen Therapy: Patient Spontanous Breathing and Patient connected to nasal cannula oxygen  Post-op Assessment: Report given to RN and Post -op Vital signs reviewed and stable  Post vital signs: stable  Last Vitals:  Vitals:   08/09/17 1134 08/09/17 1322  BP: (!) 136/59   Pulse: 67 (P) 84  Resp: 16   Temp: 36.8 C (P) 36.8 C  SpO2: 99% (P) 95%    Last Pain:  Vitals:   08/09/17 1134  TempSrc: Oral      Patients Stated Pain Goal: 3 (42/39/53 2023)  Complications: No apparent anesthesia complications

## 2017-08-09 NOTE — Anesthesia Preprocedure Evaluation (Signed)
Anesthesia Evaluation  Patient identified by MRN, date of birth, ID band Patient awake    Reviewed: Allergy & Precautions, NPO status , Patient's Chart, lab work & pertinent test results  Airway Mallampati: II  TM Distance: >3 FB Neck ROM: Full    Dental  (+) Dental Advisory Given, Lower Dentures, Upper Dentures   Pulmonary neg pulmonary ROS,    Pulmonary exam normal breath sounds clear to auscultation       Cardiovascular hypertension, Pt. on medications Normal cardiovascular exam Rhythm:Regular Rate:Normal     Neuro/Psych PSYCHIATRIC DISORDERS Anxiety Depression  Neuromuscular disease (h/o Polio with RLE weakness)    GI/Hepatic Neg liver ROS, GERD  Medicated,  Endo/Other  negative endocrine ROS  Renal/GU negative Renal ROS     Musculoskeletal  (+) Arthritis , Fibromyalgia -  Abdominal   Peds  Hematology negative hematology ROS (+)   Anesthesia Other Findings Day of surgery medications reviewed with the patient.  Reproductive/Obstetrics -Thickened Endometrium -Endometrial mass -PMB                             Anesthesia Physical Anesthesia Plan  ASA: III  Anesthesia Plan: General   Post-op Pain Management:    Induction: Intravenous  PONV Risk Score and Plan: 4 or greater and Ondansetron, Dexamethasone and Treatment may vary due to age or medical condition  Airway Management Planned: LMA  Additional Equipment:   Intra-op Plan:   Post-operative Plan: Extubation in OR  Informed Consent: I have reviewed the patients History and Physical, chart, labs and discussed the procedure including the risks, benefits and alternatives for the proposed anesthesia with the patient or authorized representative who has indicated his/her understanding and acceptance.   Dental advisory given  Plan Discussed with: CRNA  Anesthesia Plan Comments: (Risks/benefits of general anesthesia discussed  with patient including risk of damage to teeth, lips, gum, and tongue, nausea/vomiting, allergic reactions to medications, and the possibility of heart attack, stroke and death.  All patient questions answered.  Patient wishes to proceed.)        Anesthesia Quick Evaluation

## 2017-08-09 NOTE — Anesthesia Postprocedure Evaluation (Signed)
Anesthesia Post Note  Patient: Veronica Greene  Procedure(s) Performed: DILATATION & CURETTAGE/HYSTEROSCOPY WITH MYOSURE (N/A )     Patient location during evaluation: PACU Anesthesia Type: General Level of consciousness: awake and alert Pain management: pain level controlled Vital Signs Assessment: post-procedure vital signs reviewed and stable Respiratory status: spontaneous breathing, nonlabored ventilation and respiratory function stable Cardiovascular status: blood pressure returned to baseline and stable Postop Assessment: no apparent nausea or vomiting Anesthetic complications: no    Last Vitals:  Vitals:   08/09/17 1345 08/09/17 1356  BP: (!) 150/68 139/71  Pulse: 72 72  Resp: (!) 21 18  Temp:    SpO2: 96% 94%    Last Pain:  Vitals:   08/09/17 1134  TempSrc: Oral   Pain Goal: Patients Stated Pain Goal: 3 (08/09/17 1134)               Catalina Gravel

## 2017-08-09 NOTE — H&P (Signed)
Chief Complaint(s):   Postmenopausal bleeding.... PreOp for 08/09/17   HPI:  General 72 y/o presents for preop history and physical exam in preparation for hysteroscopy D&C and possible polypectomy with myosure to evaluate postmenopausal bleeding and thickened endometrium. . she had episode of postmenopausal bleeding 4 months ago. she reports one episode of spotting after sex no other episodes.  ultrasound 04/2017 uterus was 6.96 cm x 2.72 cm x 3.76 cm. Endometrium was 9.6 mm SHG on 06/25/2017 mass on the posterior wall of the endometrium 0.7 cm. Ovaries normal bilaterally.  she is complaining of urinary frequency for the last 2 days. Current Medication: Taking  Flonase(Fluticasone Propionate) 50 MCG/ACT Suspension 1 spray in each nostril Nasally Once a day     Meloxicam 7.5 MG Tablet 1/2 tablet Orally Once a day     Nexium(Esomeprazole Magnesium) 40 MG Capsule Delayed Release 1 tablet Orally once a day     Amitiza(Lubiprostone) 24 MCG Capsule 1 capsule with food Orally Twice a day     Effexor XR(Venlafaxine HCl ER) 75 MG Capsule Extended Release 24 Hour 1 capsule with food Orally Once a day     Hydrochlorothiazide 25 MG Tablet 1 tablet Orally Once a day     Klor-Con(Potassium Chloride) 20 mEq Tablet Extended Release 4 tablets Orally Once a day     Vitamin D 400 UNIT Capsule 1 capsule Orally Once a day     Vitamin C 100 MG Tablet Chewable 1 tablet Orally Once a day     Osteo Bi-Flex Regular Strength(Glucosamine-Chondroitin) 250-200 MG Tablet 1 tablet Orally once a day     Estrace(Estradiol) 0.1 MG/GM Cream 1 gram Vaginal once a week     Zyrtec 1/2 tablet Oral once a day     Nystatin-Triamcinolone 100000-0.1 UNIT/GM-% Cream 1 application to affected area Externally as needed     Medication List reviewed and reconciled with the patient   Medical History:  GERD     Stress fracture     IBS     HTN      Allergies/Intolerance:  Penicillin (for allergy) - welps     Biaxin -  sore throat     Codeine (for allergy) - nausea     Vicodin - stomach upset     Paxil - did not help   Gyn History:  Sexual activity currently sexually active. Periods : postmenopausal. LMP 05/04/1994. Last pap smear date 01/30/16. Last mammogram date 05/06/17. Denies Abnormal pap smear. Denies STD. Menarche 64.   OB History:  Number of pregnancies 2. Pregnancy # 1 live birth, boy. Pregnancy # 2 live birth, boy-died age 70 in [place]. NVD 2.   Surgical History:  Left knee surgery 1998     Right knee surgery 1998     Left thumb surgery 1998     Right thumb surgery 1999     colonoscopy w/precancerous polyp removal 10/2009     colonoscopy with polyps f/u in 06/2015 12/2014     carpal tunnel x2   Hospitalization:  surgeries     child birth x 2     Not in the past year 04/2017   Family History:  Father: deceased, stroke, hypertension, diagnosed with Hypertension    Mother: deceased, alzheimer    Maternal [place] Mother: Uterine cancer    Sister 1: alive    1 sister(s) . 2 son(s) .    Great Aunt Paternal Colon Cancer, one son killed in MVA age 50.  Social History: General Tobacco use cigarettes:  Former smoker, Quit in year tried it, Tobacco history last updated 07/30/2017.  no EXPOSURE TO PASSIVE SMOKE.  no Alcohol.  Caffeine: yes, coffee.  no Recreational drug use.  Marital Status: single, widowed/remarried.  Children: 2, Boys-one son killed in MVA at age 3.  OCCUPATION: employed, , retired.  Seat belt use: yes.   ROS: CONSTITUTIONAL No" label="Chills" value="" options="no,yes" propid="91" itemid="193425" categoryid="10464" encounterid="[id number]"Chills No. No" label="Fatigue" value="" options="no,yes" propid="91" itemid="172899" categoryid="10464" encounterid="[id number]"Fatigue No. No" label="Fever" value="" options="no,yes" propid="91" itemid="10467" categoryid="10464" encounterid="[id number]"Fever No. No" label="Night sweats" value="" options="no,yes" propid="91"  itemid="193426" categoryid="10464" encounterid="[id number]"Night sweats No. No" label="Recent travel outside [place]" value="" options="no,yes" propid="91" itemid="444261" categoryid="10464" encounterid="[id number]"Recent travel outside [place] No. No" label="Sweats" value="" options="no,yes" propid="91" itemid="193427" categoryid="10464" encounterid="[id number]"Sweats No. No" label="Weight change" value="" options="no,yes" propid="91" itemid="194825" categoryid="10464" encounterid="[id number]"Weight change No.  OPHTHALMOLOGY no" label="Blurring of vision" value="" options="no,yes" propid="91" itemid="12520" categoryid="12516" encounterid="[id number]"Blurring of vision no. no" label="Change in vision" value="" options="no,yes" propid="91" itemid="193469" categoryid="12516" encounterid="[id number]"Change in vision no. no" label="Double vision" value="" options="no,yes" propid="91" itemid="194379" categoryid="12516" encounterid="[id number]"Double vision no.  ENT no" label="Dizziness" value="" options="no,yes" propid="91" itemid="193612" categoryid="10481" encounterid="[id number]"Dizziness no. Nose bleeds no. Sore throat no. Teeth pain no.  ALLERGY no" label="Hives" value="" options="no,yes" propid="91" itemid="202589" categoryid="138152" encounterid="[id number]"Hives no.  CARDIOLOGY no" label="Chest pain" value="" options="no,yes" propid="91" itemid="193603" categoryid="10488" encounterid="[id number]"Chest pain no. no" label="High blood pressure" value="" options="no,yes" propid="91" itemid="199089" categoryid="10488" encounterid="[id number]"High blood pressure no. no" label="Irregular heart beat" value="" options="no,yes" propid="91" itemid="202598" categoryid="10488" encounterid="[id number]"Irregular heart beat no. no" label="Leg edema" value="" options="no,yes" propid="91" itemid="10491" categoryid="10488" encounterid="[id number]"Leg edema no. no" label="Palpitations" value="" options="no,yes" propid="91" itemid="10490" categoryid="10488"  encounterid="[id number]"Palpitations no.  RESPIRATORY no" label="Shortness of breath" value="" options="no" propid="91" itemid="270013" categoryid="138132" encounterid="[id number]"Shortness of breath no. no" label="Cough" value="" options="no,yes" propid="91" itemid="172745" categoryid="138132" encounterid="[id number]"Cough no. no" label="Wheezing" value="" options="no,yes" propid="91" itemid="193621" categoryid="138132" encounterid="[id number]"Wheezing no.  UROLOGY no" label="Pain with urination" value="" options="no,yes" propid="91" itemid="194377" categoryid="138166" encounterid="[id number]"Pain with urination no. no" label="Urinary urgency" value="" options="no,yes" propid="91" itemid="193493" categoryid="138166" encounterid="[id number]"Urinary urgency no. no" label="Urinary frequency" value="" options="no,yes" propid="91" itemid="193492" categoryid="138166" encounterid="[id number]"Urinary frequency no. no" label="Urinary incontinence" value="" options="no,yes" propid="91" itemid="138171" categoryid="138166" encounterid="[id number]"Urinary incontinence no. No" label="Difficulty urinating" value="" options="no,yes" propid="91" itemid="138167" categoryid="138166" encounterid="[id number]"Difficulty urinating No. No" label="Blood in urine" value="" options="no,yes" propid="91" itemid="138168" categoryid="138166" encounterid="[id number]"Blood in urine No.  GASTROENTEROLOGY no" label="Abdominal pain" value="" options="no,yes" propid="91" itemid="10496" categoryid="10494" encounterid="[id number]"Abdominal pain no. no" label="Appetite change" value="" options="no,yes" propid="91" itemid="193447" categoryid="10494" encounterid="[id number]"Appetite change no. no" label="Bloating/belching" value="" options="no,yes" propid="91" itemid="193448" categoryid="10494" encounterid="[id number]"Bloating/belching no. no" label="Blood in stool or on toilet paper" value="" options="no,yes" propid="91" itemid="10503" categoryid="10494"  encounterid="[id number]"Blood in stool or on toilet paper no. no" label="Change in bowel movements" value="" options="no,yes" propid="91" itemid="199106" categoryid="10494" encounterid="[id number]"Change in bowel movements no. no" label="Constipation" value="" options="no,yes" propid="91" itemid="10501" categoryid="10494" encounterid="[id number]"Constipation no. no" label="Diarrhea" value="" options="no,yes" propid="91" itemid="10502" categoryid="10494" encounterid="[id number]"Diarrhea no. no" label="Difficulty swallowing" value="" options="no,yes" propid="91" itemid="199104" categoryid="10494" encounterid="[id number]"Difficulty swallowing no. no" label="Nausea" value="" options="no,yes" propid="91" itemid="10499" categoryid="10494" encounterid="[id number]"Nausea no.  FEMALE REPRODUCTIVE no" label="Vulvar pain" value="" options="no,yes" propid="91" itemid="453725" categoryid="10525" encounterid="[id number]"Vulvar pain no. no" label="Vulvar rash" value="" options="no,yes" propid="91" itemid="453726" categoryid="10525" encounterid="[id number]"Vulvar rash no. yes" label="Abnormal vaginal bleeding" value="" options="no, yes" propid="91" itemid="444315" categoryid="10525" encounterid="[id number]"Abnormal vaginal bleeding yes. no" label="Breast pain" value="" options="no,yes" propid="91" itemid="186083" categoryid="10525" encounterid="[id number]"Breast pain no. no" label="Nipple discharge" value="" options="no,yes" propid="91" itemid="186084" categoryid="10525" encounterid="[id number]"Nipple discharge no. no" label="Pain with intercourse" value="" options="no,yes" propid="91" itemid="275823" categoryid="10525" encounterid="[id number]"Pain with intercourse no. no" label="Pelvic pain" value="" options="no,yes" propid="91" itemid="186082" categoryid="10525" encounterid="[id number]"Pelvic pain no. no" label="Unusual vaginal discharge" value="" options="no,yes" propid="91" itemid="278230" categoryid="10525" encounterid="[id number]"Unusual vaginal  discharge no. no" label="Vaginal itching" value="" options="no,yes" propid="91" itemid="278942" categoryid="10525" encounterid="[id number]"Vaginal itching no.  MUSCULOSKELETAL no" label="Muscle aches" value="" options="no,yes" propid="91" itemid="193461" categoryid="10514" encounterid="[id number]"Muscle aches no.  NEUROLOGY no" label="Headache" value="" options="no,yes" propid="91" itemid="12513" categoryid="12512" encounterid="[id number]"Headache no. no" label="Tingling/numbness" value="" options="no,yes" propid="91" itemid="12514" categoryid="12512" encounterid="[id number]"Tingling/numbness no. no" label="Weakness" value="" options="no,yes" propid="91" itemid="193468" categoryid="12512" encounterid="[id number]"Weakness no.  PSYCHOLOGY no" label="Depression" value="" options="" propid="91" itemid="275919" categoryid="10520" encounterid="[id number]"Depression no. no" label="Anxiety" value="" options="no,yes" propid="91" itemid="172748" categoryid="10520" encounterid="[id number]"Anxiety no. no" label="Nervousness" value="" options="no,yes" propid="91" itemid="199158" categoryid="10520" encounterid="[id number]"Nervousness no. no" label="Sleep disturbances" value="" options="no,yes" propid="91" itemid="12502" categoryid="10520" encounterid="[id number]"Sleep disturbances no. no " label="Suicidal ideation" value="" options="no,yes" propid="91" itemid="72718" categoryid="10520" encounterid="[id number]"Suicidal ideation no .  ENDOCRINOLOGY no" label="Excessive thirst" value="" options="no,yes" propid="91" itemid="194628" categoryid="12508" encounterid="[id number]"Excessive thirst no. no" label="Excessive urination" value="" options="no,yes" propid="91" itemid="196285" categoryid="12508" encounterid="[id number]"Excessive urination no. no" label="Hair loss" value="" options="no, yes" propid="91" itemid="444314" categoryid="12508" encounterid="[id number]"Hair loss no. no" label="Heat or cold intolerance" value="" options="" propid="91"  itemid="447284" categoryid="12508" encounterid="[id number]"Heat or cold intolerance no.  HEMATOLOGY/LYMPH no" label="Abnormal bleeding" value="" options="no,yes" propid="91" itemid="199152" categoryid="138157" encounterid="10059162"Abnormal bleeding no. no" label="Easy bruising" value="" options="no,yes" propid="91" itemid="170653" categoryid="138157" encounterid="10059162"Easy bruising no. no" label="Swollen glands" value="" options="no,yes" propid="91" itemid="138158" categoryid="138157" encounterid="10059162"Swollen glands no.  DERMATOLOGY no" label="New/changing skin lesion" value="" options="no,yes" propid="91" itemid="199126" categoryid="12503" encounterid="[id number]"New/changing skin lesion no. no" label="Rash" value="" options="no,yes" propid="91" itemid="12504" categoryid="12503" encounterid="[id number]"Rash no. no" label="Sores" value="" options="" propid="91" itemid="444313" categoryid="12503" encounterid="[id number]"Sores no.   Negative except as stated in HPI.  Objective: Vitals: Wt 192.8, Wt change 6.8 lb, Ht 63.5, BMI 33.61, Pulse sitting 76, BP sitting 126/68  Past Results: Examination:  General Examination alert, oriented, NAD " label="GENERAL APPEARANCE" categoryPropId="10089" examid="193638"GENERAL APPEARANCE alert, oriented, NAD .  moist, warm" label="SKIN:" categoryPropId="10109" examid="193638"SKIN: moist, warm.  Conjunctiva clear" label="EYES:" categoryPropId="21468" examid="193638"EYES: Conjunctiva clear.  clear to auscultation bilaterally" label="LUNGS:" categoryPropId="87" examid="193638"LUNGS: clear to auscultation bilaterally.  regular rate and rhythm" label="HEART:" categoryPropId="86" examid="193638"HEART: regular rate and rhythm.  soft, non-tender/non-distended, bowel sounds present " label="ABDOMEN:" categoryPropId="88" examid="193638"ABDOMEN: soft, non-tender/non-distended, bowel sounds present .  normal external genitalia, labia - unremarkable, vagina - pink moist  mucosa, no lesions or abnormal discharge, cervix - no discharge or lesions or CMT, adnexa - no masses or tenderness, uterus - nontender and normal size on palpation " label="FEMALE GENITOURINARY:" categoryPropId="13414" examid="193638"FEMALE GENITOURINARY: normal external genitalia, labia - unremarkable, vagina - pink moist mucosa, no lesions or abnormal discharge, cervix - no discharge or lesions or CMT, adnexa - no masses or tenderness, uterus - nontender and normal size on palpation .  affect normal, good eye contact" label="PSYCH:" categoryPropId="16316" examid="193638"PSYCH: affect normal, good eye contact.  Physical Examination:    Assessment: Assessment:  Postmenopausal bleeding - N95.0 (Primary)     Urinary frequency - R35.0     Endometrial mass - N94.89     Plan: Treatment: Postmenopausal bleeding Notes: I recommend hysteroscopy D&C and polypectomy given high suscpision for endometrial polyp...she has opted for hysteroscopy D&C and polypectomy . rb/a of surgery discussed with the patient including but not limited to infection bleeding perforation of the uterus with the need for further surgery. . pt voiced understanding and desires to proceed . she is advised not to eat anything after midnight.  CARDIAC CLEARANCE RECIEVED From Dr. Loura Pardon patient PCP. Urinary frequency PIR:JJOAC Dip w/reflex to micro if positive  ZYS:[id number], Urine Routine ([id number])  Endometrial mass Notes: I recommend hysteroscopy D&C and polypectomy given high suscpision for endometrial polyp...she has opted for hysteroscopy D&C and polypectomy . rb/a of surgery discussed with the patient including but not limited to infection bleeding perforation of the uterus with the need for further surgery. . pt voiced understanding and desires to proceed . she is advised not to eat anything after midnight.. Procedures:   Immunizations: Therapeutic Injections:  Diagnostic Imaging: Lab Reports: JGO:[id number], Urine  Routine ([id number])  Urine Culture, Routine -  Final report  -     Ilyana Manuele 08/02/2017 09:59:41 AM > no uti based on culture Chapman,Courtney 08/06/2017 02:30:24 PM > pt made aware of results by phone   CBU:LAGTX Dip w/reflex to micro if positive  Specific Gravity -  1.010  1.010-1.030 -   pH -  5.5  5.0-8.0 -   Leukocyte Esterase -  NEGATIVE  Negative -   Blood -  TRACE A Negative - ERY/UL  Glucose -  NEGATIVE  Negative - g/dL  Nitrite -  NEGATIVE  Negative -   Protein -  NEGATIVE  Negative - mg/dL  Ketones -  NEGATIVE  Negative - mg/dL  Urine Bilirubin -  NEGATIVE  Negative -   Urobilinogen -  0.2  0.0-1.0 - mg/dL    Jakaden Ouzts 07/31/2017 11:20:13 AM > uti unlikely .Marland Kitchen culture pending Chapman,Courtney 07/31/2017 12:23:39 PM > pt made aware of results by phone   Procedure Orders: Preventive Medicine:    Health Risk Assessment: Care Plan:

## 2017-08-10 ENCOUNTER — Encounter (HOSPITAL_COMMUNITY): Payer: Self-pay | Admitting: Obstetrics and Gynecology

## 2017-08-21 ENCOUNTER — Telehealth: Payer: Self-pay | Admitting: *Deleted

## 2017-08-21 NOTE — Telephone Encounter (Signed)
Called and left the patient a message to call the office back. Patient can either see Dr. Skeet Latch on February 28th or wait for Dr. Denman George on March 7th.

## 2017-08-22 ENCOUNTER — Telehealth: Payer: Self-pay | Admitting: *Deleted

## 2017-08-22 NOTE — Telephone Encounter (Signed)
Please send a copy of her EKG and echocardiogram report (unless that doctor is on epic) Thanks

## 2017-08-22 NOTE — Telephone Encounter (Signed)
Copied from [place] [phone]. Topic: Referral - Question >> Aug 08, 2017  2:15 PM Conception Chancy, NT wrote: Patient has a referral to a cardiologist and she has an appt 08/23/17. She would like to have her echo cardiogram, EKG and anything else Dr. Glori Bickers may think is necessary faxed to her cardiologist.(Dr.Hardin)  Fax # [phone]

## 2017-08-23 ENCOUNTER — Ambulatory Visit: Payer: PPO | Admitting: Cardiology

## 2017-08-23 NOTE — Telephone Encounter (Signed)
Dr. Wilhemina Cash is in [place] so he will have access to patient's records/thx dmf

## 2017-08-27 ENCOUNTER — Encounter: Payer: Self-pay | Admitting: Cardiology

## 2017-08-27 ENCOUNTER — Ambulatory Visit: Payer: PPO | Admitting: Cardiology

## 2017-08-27 VITALS — BP 145/74 | HR 70 | Ht 65.0 in | Wt 191.4 lb

## 2017-08-27 DIAGNOSIS — E785 Hyperlipidemia, unspecified: Secondary | ICD-10-CM

## 2017-08-27 DIAGNOSIS — I10 Essential (primary) hypertension: Secondary | ICD-10-CM

## 2017-08-27 DIAGNOSIS — I350 Nonrheumatic aortic (valve) stenosis: Secondary | ICD-10-CM | POA: Diagnosis not present

## 2017-08-27 HISTORY — DX: Nonrheumatic aortic (valve) stenosis: I35.0

## 2017-08-27 NOTE — Patient Instructions (Addendum)
NO MEDICATION CHANGES    TEST SCHEDULE AT [place] IN FEB 2020 Your physician has requested that you have an echocardiogram. Echocardiography is a painless test that uses sound waves to create images of your heart. It provides your doctor with information about the size and shape of your heart and how well your heart's chambers and valves are working. This procedure takes approximately one hour. There are no restrictions for this procedure.   Your physician wants you to follow-up in [place].You will receive a reminder letter in the mail two months in advance. If you don't receive a letter, please call our office to schedule the follow-up appointment.    If you need a refill on your cardiac medications before your next appointment, please call your pharmacy.

## 2017-08-27 NOTE — Progress Notes (Signed)
PCP: Abner Greenspan, MD  Clinic Note: Chief Complaint  Patient presents with  . New Patient (Initial Visit)    murmur  . Cardiac Valve Problem    Echo - Moderate AS.    HPI: Veronica Greene is a 72 y.o. female who presents for the evaluation of aortic stenosis on echocardiogram  order to evaluate murmur.  She is being seen today at the request of Tower, [name], MD. And is a very pleasant woman who I know as the wife of 1 of my patients.  She has not had any prior cardiac issues, and is really only on HCTZ for hypertension. Unfortunately, she was recently diagnosed with uterine cancer from hysteroscopy and D&C with polypectomy.  KHAI TORBERT was last seen on July 23, 2017 by her PCP for preoperative screening for gynecologic surgery.  During that evaluation, she was found to have a systolic ejection murmur and was referred for echocardiography that showed moderate aortic stenosis with a gradient noted below.  Presumably, this was the first time she had heard about a murmur.  Recent Hospitalizations: She had her D&C/hysteroscopy and polypectomy on February 1 -biopsy results showed uterine cancer in early stages.  Studies Personally Reviewed - (if available, images/films reviewed: From Epic Chart or Care Everywhere)  2 D Echo (for murmur) July 30, 2017: EF 60-65%, Gr 2 DD. Mod AS (mean gradient 23 mmHg), mild AI   Interval History: And presents here today to discuss results of her echo but denies having any cardiac symptoms to speak of.  She has a family history she her sister having had a heart attack but this was in the setting of significant renal disease.  Her father had a stroke at age 50 and her mother died at age 63 with complications of renal failure and Alzheimer's disease.  No real cardiac history in her family. She does report having had rheumatic fever as a child (preteen), but never was told that she had any residual effect from that.  She had polio as a child as well  and as result has had unequal legs with the left leg being shorter than the right and she wears a special shoe for that.  She therefore is not all that active, but tries to stay active cleaning the house and doing chores.  She and her husband do walk some, but she does not do routine exercise.  At baseline, she is astigmatic from a cardiac standpoint:  No chest pain or shortness of breath with rest or exertion.  No PND, orthopnea or edema.  No palpitations, lightheadedness, dizziness, weakness or syncope/near syncope. No TIA/amaurosis fugax symptoms. No melena, hematochezia, hematuria, or epstaxis. No claudication.  ROS: A comprehensive was performed. Review of Systems  Constitutional: Negative for malaise/fatigue.  HENT: Negative for congestion.   Respiratory: Negative for cough, shortness of breath and wheezing.   Gastrointestinal: Negative for abdominal pain, heartburn and nausea.  Genitourinary: Negative for dysuria.  Musculoskeletal: Positive for joint pain (Arthritis pains and residual from her imbalance leg).  Neurological: Negative for dizziness.  Psychiatric/Behavioral: The patient is nervous/anxious (Anxious after that her diagnosis of cancer).   All other systems reviewed and are negative.  I have reviewed and (if needed) personally updated the patient's problem list, medications, allergies, past medical and surgical history, social and family history.   Past Medical History:  Diagnosis Date  . Allergic rhinitis   . Arthritis    knees  . Cataracts, bilateral  early cataracts-just watching   . Depression   . Depression with anxiety   . Fibromyalgia    tx mobic  . Full dentures   . GERD (gastroesophageal reflux disease)   . Gout   . Hearing loss    wears bilateral hearing aids  . Hypertension   . IBS (irritable bowel syndrome)    with constipation  . Moderate aortic stenosis by prior echocardiogram 07/2017   Previously diagnosed with murmur back in 2013.  2D  echo January 2019: Moderate AS. Mean gradient 23 mmHg.  . Osteopenia   . Polio age 36 months   right leg weakness and 1.5 inches shorter than left leg.   . Sinusitis, chronic   . SVD (spontaneous vaginal delivery)    x 2  . Urinary incontinence   . Uterine cancer ([place]) 08/2017   Diagnosed with D&C/hysteroscopy with polypectomy    Past Surgical History:  Procedure Laterality Date  . ANKLE SURGERY Right 1956  . CARPAL TUNNEL RELEASE Bilateral 1986, 1993  . COLONOSCOPY  11/09/2011  . COLONOSCOPY N/A 06/24/2015   Procedure: COLONOSCOPY;  Surgeon: Gatha Mayer, MD;  Location: [facility];  Service: Endoscopy;  Laterality: N/A;  . COLONOSCOPY W/ POLYPECTOMY  10/21/2009  . DILATATION & CURETTAGE/HYSTEROSCOPY WITH MYOSURE N/A 08/09/2017   Procedure: DILATATION & CURETTAGE/HYSTEROSCOPY WITH MYOSURE;  Surgeon: Christophe Louis, MD;  Location: [facility];  Service: Gynecology;  Laterality: N/A;  Polypectomy  . DILATION AND CURETTAGE OF UTERUS     PMB  . HAND SURGERY Bilateral 1999   Thumbs   . KNEE SURGERY Bilateral 1998   x2  . MULTIPLE TOOTH EXTRACTIONS    . NASAL SINUS SURGERY  1976  . polio surgery.     right foot - right leg 1.5" shorter than left leg - some wekness  . THUMB ARTHROSCOPY  1999  . TONSILLECTOMY  2940   72 years old  . TRANSTHORACIC ECHOCARDIOGRAM  07/2017   EF 60-65%, Gr 2 DD. Mod AS (mean gradient 23 mmHg), mild AI   . TUBAL LIGATION    . UPPER GI ENDOSCOPY     normal  . WISDOM TOOTH EXTRACTION      Current Meds  Medication Sig  . acetaminophen (TYLENOL 8 HOUR ARTHRITIS PAIN) 650 MG CR tablet Take 650 mg by mouth at bedtime.  . Ascorbic Acid (VITAMIN C) 1000 MG tablet Take 1,000 mg by mouth daily.    Marland Kitchen aspirin 81 MG EC tablet Take 1 tablet (81 mg total) by mouth daily. DO NOT TAKE AGAIN UNTIL 07/11/2015 (Patient taking differently: Take 81 mg by mouth at bedtime. )  . Calcium-Vitamin D-Vitamin K (VIACTIV) 573-220-25 MG-UNT-MCG CHEW Chew 1 tablet by mouth 2 (two) times  daily. Morning & afternoon  . cetirizine (ZYRTEC) 10 MG tablet Take 5 mg by mouth at bedtime as needed for allergies (alternates between mucinex & zyrtec for sinus symptoms).  . cholecalciferol (VITAMIN D) 1000 UNITS tablet Take 1,000 Units by mouth daily with breakfast.   . colchicine 0.6 MG tablet Take 1 tablet (0.6 mg total) by mouth 2 (two) times daily. (Patient taking differently: Take 0.6 mg by mouth 2 (two) times daily as needed (for gout flare ups.). )  . conjugated estrogens (PREMARIN) vaginal cream Place 1 Applicatorful vaginally every Saturday.   . esomeprazole (NEXIUM) 20 MG capsule Take 40 mg by mouth daily before breakfast.  . fluticasone (FLONASE) 50 MCG/ACT nasal spray Place 2 sprays into both nostrils at bedtime.   Marland Kitchen  guaiFENesin (MUCINEX) 600 MG 12 hr tablet Take 600 mg by mouth at bedtime as needed (alternates between mucinex & zyrtec for sinus symptoms).  . hydrochlorothiazide (HYDRODIURIL) 25 MG tablet Take 1 tablet (25 mg total) by mouth daily. (Patient taking differently: Take 25 mg by mouth daily with lunch. )  . Hydrocortisone-Acetic Acid (ACETIC ACID-HC DROPS OT) Place 1-2 drops in ear(s) at bedtime as needed (for itching).  Marland Kitchen ibuprofen (ADVIL,MOTRIN) 800 MG tablet Take 1 tablet (800 mg total) by mouth every 8 (eight) hours as needed.  . lubiprostone (AMITIZA) 24 MCG capsule TAKE 1 CAPSULE BY MOUTH TWICE A DAY. (Patient taking differently: Take 24 mcg by mouth 2 (two) times daily. LUNCH & SUPPER)  . meloxicam (MOBIC) 7.5 MG tablet Take 1 tablet (7.5 mg total) by mouth daily after breakfast. (Patient taking differently: Take 7.5 mg by mouth at bedtime. )  . Misc Natural Products (OSTEO BI-FLEX JOINT SHIELD PO) Take 1 tablet by mouth daily.   . Multiple Vitamin (MULTIVITAMIN WITH MINERALS) TABS tablet Take 1 tablet by mouth daily. WOMEN'S MULTIVITAMIN  . Polyethyl Glycol-Propyl Glycol (SYSTANE) 0.4-0.3 % SOLN Place 1 drop into both eyes 2 (two) times daily.  . potassium  chloride (K-DUR) 10 MEQ tablet Take 2 tablets (20 mEq total) by mouth 2 (two) times daily.  . sennosides-docusate sodium (SENOKOT-S) 8.6-50 MG tablet Take 1 tablet by mouth at bedtime.   . Simethicone (PHAZYME PO) Take 1 tablet by mouth 4 (four) times daily as needed (for gas (typically one tablet at night)).   . sodium chloride (AYR) 0.65 % nasal spray Place 1 spray into the nose 4 (four) times daily as needed for congestion.  . triamcinolone (KENALOG) 0.025 % cream Apply 1 application topically 2 (two) times daily. (Patient taking differently: Apply 1 application topically 2 (two) times daily as needed (for dry/itchy skin.). )  . venlafaxine XR (EFFEXOR XR) 75 MG 24 hr capsule Take 1 capsule (75 mg total) by mouth daily.    Allergies  Allergen Reactions  . Clarithromycin Swelling    throat swells  . Codeine Nausea And Vomiting  . Paroxetine Other (See Comments)    Ineffective.  Marland Kitchen Penicillins     Has patient had a PCN reaction causing immediate rash, facial/tongue/throat swelling, SOB or lightheadedness with hypotension: No Has patient had a PCN reaction causing severe rash involving mucus membranes or skin necrosis: Unknown Has patient had a PCN reaction that required hospitalization:No Has patient had a PCN reaction occurring within the last 10 years: No If all of the above answers are "NO", then may proceed with Cephalosporin use.     Social History   Tobacco Use  . Smoking status: Never Smoker  . Smokeless tobacco: Never Used  Substance Use Topics  . Alcohol use: No    Alcohol/week: 0.0 oz  . Drug use: No   Social History   Social History Narrative   She is relatively recently remarried.  She has 2  children and 3 grandchildren.     She is currently retired.  But enjoys cleaning house and doing chores.  She likes to do yard work.  Does not routinely exercise.    family history includes Alzheimer's disease in her mother; Cancer in her paternal aunt; Colon cancer (age of  onset: 64) in her father and paternal aunt; Heart attack in her sister; Kidney disease in her mother and sister; Stroke (age of onset: 11) in her father.  Wt Readings from Last 3 Encounters:  08/27/17 191 lb 6.4 oz (86.8 kg)  07/31/17 192 lb 6 oz (87.3 kg)  07/23/17 190 lb 12 oz (86.5 kg)    PHYSICAL EXAM BP (!) 145/74   Pulse 70   Ht 5\' 5"  (1.651 m)   Wt 191 lb 6.4 oz (86.8 kg)   BMI 31.85 kg/m  Physical Exam  Constitutional: She is oriented to person, place, and time. She appears well-developed and well-nourished. No distress.  Otherwise healthy appearing.  Well-groomed  HENT:  Head: Normocephalic and atraumatic.  Mouth/Throat: No oropharyngeal exudate.  Eyes: Conjunctivae and EOM are normal. Pupils are equal, round, and reactive to light. No scleral icterus.  Neck: Normal range of motion. Neck supple. No hepatojugular reflux and no JVD present. Carotid bruit is not present (Radiated aortic murmur; normal carotid upstroke).  Cardiovascular: Normal rate, regular rhythm and intact distal pulses.  No extrasystoles are present. PMI is not displaced. Exam reveals no gallop and no friction rub.  Murmur heard.  Medium-pitched harsh crescendo-decrescendo midsystolic murmur is present with a grade of 2/6 at the upper right sternal border radiating to the neck. Pulmonary/Chest: Effort normal. No respiratory distress. She has no wheezes. She has no rales.  Abdominal: Soft. Bowel sounds are normal. She exhibits no distension. There is no tenderness. There is no rebound.  Musculoskeletal: Normal range of motion. She exhibits no edema.  Left orthotic shoe with very thick soled to make up the difference in leg length  Neurological: She is alert and oriented to person, place, and time. No cranial nerve deficit.  Skin: Skin is warm and dry. No rash noted. No erythema.  Psychiatric: She has a normal mood and affect. Her behavior is normal. Judgment and thought content normal.  Vitals reviewed.     Adult ECG Report  Rate: 72 ;  Rhythm: normal sinus rhythm and Normal axis, intervals and durations;   Narrative Interpretation: Normal EKG   Other studies Reviewed: Additional studies/ records that were reviewed today include:  Recent Labs: Near due for annual follow-up Lab Results  Component Value Date   CHOL 153 11/02/2016   HDL 37.70 (L) 11/02/2016   LDLCALC 90 11/02/2016   TRIG 127.0 11/02/2016   CHOLHDL 4 11/02/2016    ASSESSMENT / PLAN: Problem List Items Addressed This Visit    Essential hypertension (Chronic)    Pretty well controlled on HCTZ alone.  Hard to react to one reading as her pressures are slightly elevated today.  Usually at home, she says they are better controlled.  Will defer management to PCP since this is been relatively well controlled in the past.      Hyperlipidemia, mild (Chronic)    Lipid panel from last April did not look that bad.  With new diagnosis of aortic stenosis, target LDL should be closer to 70 but at least less than 100.  She is not on a statin, and seems to doing relatively well.  We talked about dietary adjustments, if not reaching goal may want to consider therapy, but for now probably okay.      Moderate aortic stenosis by prior echocardiogram - Primary (Chronic)    Moderate (barely moderate) aortic stenosis on echo.  We discussed the pathophysiology of aortic stenosis.  Probably related to rheumatic disease back as a child. She is not having any cardiac symptoms, and based on the lobe mean gradient, low likelihood of rapid progression, however since this is the initial diagnosis, we will check an echocardiogram next year prior to her 82-month  follow-up.  If it is stable at that time, would probably do every 2 to every 3-year follow-up echoes. Discussed the importance of blood pressure and lipid control since after sclerotic disease is the same process as aortic valvular disease. Important thing to note, that with Moderate aortic stenosis,  there is no limitation for her from an activity standpoint, and there is no reason for her not to have surgery.      Relevant Orders   ECHOCARDIOGRAM COMPLETE    She will not need cardiology assessment for preoperative risk assessment.  From a cardiology standpoint, she would be low risk.    Current medicines are reviewed at length with the patient today. (+/- concerns) none The following changes have been made:None  Patient Instructions  NO MEDICATION CHANGES    TEST SCHEDULE AT [place] FEB 2020 Your physician has requested that you have an echocardiogram. Echocardiography is a painless test that uses sound waves to create images of your heart. It provides your doctor with information about the size and shape of your heart and how well your heart's chambers and valves are working. This procedure takes approximately one hour. There are no restrictions for this procedure.   Your physician wants you to follow-up in [place].You will receive a reminder letter in the mail two months in advance. If you don't receive a letter, please call our office to schedule the follow-up appointment.    If you need a refill on your cardiac medications before your next appointment, please call your pharmacy.    Studies Ordered:   Orders Placed This Encounter  Procedures  . ECHOCARDIOGRAM COMPLETE      Glenetta Hew, M.D., M.S. Interventional Cardiologist   Pager # [phone] Phone # [phone] [address] [place], [place] [address]   Thank you for choosing Heart[facility] at [facility]!!

## 2017-08-28 ENCOUNTER — Encounter: Payer: Self-pay | Admitting: Podiatry

## 2017-08-28 ENCOUNTER — Other Ambulatory Visit: Payer: Self-pay | Admitting: Podiatry

## 2017-08-28 ENCOUNTER — Ambulatory Visit: Payer: PPO | Admitting: Podiatry

## 2017-08-28 ENCOUNTER — Ambulatory Visit (INDEPENDENT_AMBULATORY_CARE_PROVIDER_SITE_OTHER): Payer: PPO

## 2017-08-28 DIAGNOSIS — M722 Plantar fascial fibromatosis: Secondary | ICD-10-CM | POA: Diagnosis not present

## 2017-08-28 DIAGNOSIS — M779 Enthesopathy, unspecified: Secondary | ICD-10-CM

## 2017-08-28 DIAGNOSIS — Q828 Other specified congenital malformations of skin: Secondary | ICD-10-CM

## 2017-08-28 DIAGNOSIS — M79671 Pain in right foot: Secondary | ICD-10-CM

## 2017-08-28 MED ORDER — TRIAMCINOLONE ACETONIDE 10 MG/ML IJ SUSP
10.0000 mg | Freq: Once | INTRAMUSCULAR | Status: AC
  Administered 2017-08-28: 10 mg

## 2017-08-29 ENCOUNTER — Encounter: Payer: Self-pay | Admitting: Cardiology

## 2017-08-29 DIAGNOSIS — I10 Essential (primary) hypertension: Secondary | ICD-10-CM | POA: Insufficient documentation

## 2017-08-29 HISTORY — DX: Essential (primary) hypertension: I10

## 2017-08-29 NOTE — Assessment & Plan Note (Addendum)
Moderate (barely moderate) aortic stenosis on echo.  We discussed the pathophysiology of aortic stenosis.  Probably related to rheumatic disease back as a child. She is not having any cardiac symptoms, and based on the lobe mean gradient, low likelihood of rapid progression, however since this is the initial diagnosis, we will check an echocardiogram next year prior to her 72-month follow-up.  If it is stable at that time, would probably do every 2 to every 3-year follow-up echoes. Discussed the importance of blood pressure and lipid control since after sclerotic disease is the same process as aortic valvular disease. Important thing to note, that with Moderate aortic stenosis, there is no limitation for her from an activity standpoint, and there is no reason for her not to have surgery.

## 2017-08-29 NOTE — Assessment & Plan Note (Signed)
Lipid panel from last April did not look that bad.  With new diagnosis of aortic stenosis, target LDL should be closer to 70 but at least less than 100.  She is not on a statin, and seems to doing relatively well.  We talked about dietary adjustments, if not reaching goal may want to consider therapy, but for now probably okay.

## 2017-08-29 NOTE — Assessment & Plan Note (Signed)
Pretty well controlled on HCTZ alone.  Hard to react to one reading as her pressures are slightly elevated today.  Usually at home, she says they are better controlled.  Will defer management to PCP since this is been relatively well controlled in the past.

## 2017-08-30 NOTE — Progress Notes (Signed)
Subjective:   Patient ID: Veronica Greene, female   DOB: 72 y.o.   MRN: [id number]   HPI Patient presents with inflammation in the plantar heel right and also has large keratotic lesion sub-plantar heel and also into the left fifth metatarsal.  States they are very tender and she feels like it is worse when she gets up in the morning   ROS      Objective:  Physical Exam  Fasciitis-like symptoms with keratotic lesion plantar right that is deep in its intensity with lesion also on the left     Assessment:  Inflammatory fasciitis with keratotic lesion formation deep in its intensity     Plan:  Injected the plantar fascia right 3 mg Kenalog 5 mg Xylocaine debrided lesion fully no iatrogenic bleeding and reappoint as needed

## 2017-09-05 DIAGNOSIS — D485 Neoplasm of uncertain behavior of skin: Secondary | ICD-10-CM | POA: Diagnosis not present

## 2017-09-05 DIAGNOSIS — C44722 Squamous cell carcinoma of skin of right lower limb, including hip: Secondary | ICD-10-CM | POA: Diagnosis not present

## 2017-09-12 ENCOUNTER — Encounter: Payer: Self-pay | Admitting: Gynecologic Oncology

## 2017-09-12 ENCOUNTER — Inpatient Hospital Stay: Payer: PPO | Attending: Gynecologic Oncology | Admitting: Gynecologic Oncology

## 2017-09-12 VITALS — BP 147/62 | HR 70 | Temp 98.0°F | Resp 20 | Wt 191.6 lb

## 2017-09-12 DIAGNOSIS — Z8542 Personal history of malignant neoplasm of other parts of uterus: Secondary | ICD-10-CM | POA: Insufficient documentation

## 2017-09-12 DIAGNOSIS — C541 Malignant neoplasm of endometrium: Secondary | ICD-10-CM | POA: Diagnosis not present

## 2017-09-12 NOTE — Progress Notes (Signed)
Consult Note: Gyn-Onc  Consult was requested by Dr. Landry Mellow for the evaluation of Veronica Greene 72 y.o. female  CC:  Chief Complaint  Patient presents with  . Endometrial cancer [facility])    Assessment/Plan:  Veronica Greene  is a 72 y.o.  year old with grade 1 endometrial cancer and aortic stenosis.   A detailed discussion was held with the patient and her family with regard to to her endometrial cancer diagnosis. We discussed the standard management options for uterine cancer which includes surgery followed possibly by adjuvant therapy depending on the results of surgery. The options for surgical management include a hysterectomy and removal of the tubes and ovaries possibly with removal of pelvic and para-aortic lymph nodes.If feasible, a minimally invasive approach including a robotic hysterectomy or laparoscopic hysterectomy have benefits including shorter hospital stay, recovery time and better wound healing than with open surgery. The patient has been counseled about these surgical options and the risks of surgery in general including infection, bleeding, damage to surrounding structures (including bowel, bladder, ureters, nerves or vessels), and the postoperative risks of PE/ DVT, and lymphedema. I extensively reviewed the additional risks of robotic hysterectomy including possible need for conversion to open laparotomy.  I discussed positioning during surgery of trendelenberg and risks of minor facial swelling and care we take in preoperative positioning.  After counseling and consideration of her options, she desires to proceed with robotic assisted total hysterectomy with bilateral sapingo-oophorectomy and SLN biopsy.   She has aortic stenosis (asymptomatic), moderate. She has recently seen her cardiologist prior to her D&C and was "cleared" for this procedure. We will enquire if the added complexity of this procedure (2 hours in steep trendelenberg with elevated intra-abdominal pressure) will  change this recommendation.  Will hold ASA preop.  She will be seen by anesthesia for preoperative clearance and discussion of postoperative pain management.  She was given the opportunity to ask questions, which were answered to her satisfaction, and she is agreement with the above mentioned plan of care.   HPI: Veronica Greene is a 72 year old parous woman who is seen in consultation at the request of Dr Landry Mellow for grade 1 endometrial cancer.  She first began vaginal spotting in July 2017.  She was seen for an annual visit with Dr. Landry Mellow in October 2018 and reported this symptom.  A transvaginal ultrasound scan was performed on June 25, 2017 and revealed a normal-sized uterus measuring 7 x 4 x 2.7 cm with normal ovaries bilaterally.  The endometrial thickness was 1.28 cm.  On saline infusion the endometrial hyperechoic mass measured 7 mm.  She was noted to have a systolic cardiac murmur and was seen by cardiology who performed a transthoracic echo on July 30, 2017.  This revealed moderate aortic stenosis.  The left ventricle was grossly normal in cavity size, though with some increased ventricular wall thickness.  Ejection fraction was 60-65%.  She was cleared for surgical procedure.  Of note she was asymptomatic from this aortic stenosis.  She has a remote history of rheumatic heart disease as a child.  On August 09, 2017 she underwent a hysteroscopy D&C.  Final pathology confirmed a FIGO grade 1 endometrioid endometrial adenocarcinoma.  The patient is otherwise very healthy.  She has had no prior abdominal surgeries.  Current Meds:  Outpatient Encounter Medications as of 09/12/2017  Medication Sig  . acetaminophen (TYLENOL 8 HOUR ARTHRITIS PAIN) 650 MG CR tablet Take 650 mg by mouth at bedtime.  Marland Kitchen  Ascorbic Acid (VITAMIN C) 1000 MG tablet Take 1,000 mg by mouth daily.    Marland Kitchen aspirin 81 MG EC tablet Take 1 tablet (81 mg total) by mouth daily. DO NOT TAKE AGAIN UNTIL 07/11/2015 (Patient taking  differently: Take 81 mg by mouth at bedtime. )  . Calcium-Vitamin D-Vitamin K (VIACTIV) 431-540-08 MG-UNT-MCG CHEW Chew 1 tablet by mouth 2 (two) times daily. Morning & afternoon  . cetirizine (ZYRTEC) 10 MG tablet Take 5 mg by mouth at bedtime as needed for allergies (alternates between mucinex & zyrtec for sinus symptoms).  . cholecalciferol (VITAMIN D) 1000 UNITS tablet Take 1,000 Units by mouth daily with breakfast.   . colchicine 0.6 MG tablet Take 1 tablet (0.6 mg total) by mouth 2 (two) times daily. (Patient taking differently: Take 0.6 mg by mouth 2 (two) times daily as needed (for gout flare ups.). )  . conjugated estrogens (PREMARIN) vaginal cream Place 1 Applicatorful vaginally every Saturday.   . esomeprazole (NEXIUM) 20 MG capsule Take 40 mg by mouth daily before breakfast.  . fluticasone (FLONASE) 50 MCG/ACT nasal spray Place 2 sprays into both nostrils at bedtime.   Marland Kitchen guaiFENesin (MUCINEX) 600 MG 12 hr tablet Take 600 mg by mouth at bedtime as needed (alternates between mucinex & zyrtec for sinus symptoms).  . hydrochlorothiazide (HYDRODIURIL) 25 MG tablet Take 1 tablet (25 mg total) by mouth daily. (Patient taking differently: Take 25 mg by mouth daily with lunch. )  . Hydrocortisone-Acetic Acid (ACETIC ACID-HC DROPS OT) Place 1-2 drops in ear(s) at bedtime as needed (for itching).  Marland Kitchen ibuprofen (ADVIL,MOTRIN) 800 MG tablet Take 1 tablet (800 mg total) by mouth every 8 (eight) hours as needed.  . lubiprostone (AMITIZA) 24 MCG capsule TAKE 1 CAPSULE BY MOUTH TWICE A DAY. (Patient taking differently: Take 24 mcg by mouth 2 (two) times daily. LUNCH & SUPPER)  . meloxicam (MOBIC) 7.5 MG tablet Take 1 tablet (7.5 mg total) by mouth daily after breakfast. (Patient taking differently: Take 7.5 mg by mouth at bedtime. )  . Misc Natural Products (OSTEO BI-FLEX JOINT SHIELD PO) Take 1 tablet by mouth daily.   . Multiple Vitamin (MULTIVITAMIN WITH MINERALS) TABS tablet Take 1 tablet by mouth  daily. WOMEN'S MULTIVITAMIN  . Polyethyl Glycol-Propyl Glycol (SYSTANE) 0.4-0.3 % SOLN Place 1 drop into both eyes 2 (two) times daily.  . potassium chloride (K-DUR) 10 MEQ tablet Take 2 tablets (20 mEq total) by mouth 2 (two) times daily.  . sennosides-docusate sodium (SENOKOT-S) 8.6-50 MG tablet Take 1 tablet by mouth at bedtime.   . Simethicone (PHAZYME PO) Take 1 tablet by mouth 4 (four) times daily as needed (for gas (typically one tablet at night)).   . sodium chloride (AYR) 0.65 % nasal spray Place 1 spray into the nose 4 (four) times daily as needed for congestion.  . triamcinolone (KENALOG) 0.025 % cream Apply 1 application topically 2 (two) times daily. (Patient taking differently: Apply 1 application topically 2 (two) times daily as needed (for dry/itchy skin.). )  . venlafaxine XR (EFFEXOR XR) 75 MG 24 hr capsule Take 1 capsule (75 mg total) by mouth daily.   No facility-administered encounter medications on file as of 09/12/2017.     Allergy:  Allergies  Allergen Reactions  . Clarithromycin Swelling    throat swells  . Codeine Nausea And Vomiting  . Paroxetine Other (See Comments)    Ineffective.  Marland Kitchen Penicillins     Has patient had a PCN reaction causing immediate rash,  facial/tongue/throat swelling, SOB or lightheadedness with hypotension: No Has patient had a PCN reaction causing severe rash involving mucus membranes or skin necrosis: Unknown Has patient had a PCN reaction that required hospitalization:No Has patient had a PCN reaction occurring within the last 10 years: No If all of the above answers are "NO", then may proceed with Cephalosporin use.     Social Hx:   Social History   Socioeconomic History  . Marital status: Married    Spouse name: Not on file  . Number of children: 2  . Years of education: Not on file  . Highest education level: High school graduate  Social Needs  . Financial resource strain: Not on file  . Food insecurity - worry: Not on file  .  Food insecurity - inability: Not on file  . Transportation needs - medical: Not on file  . Transportation needs - non-medical: Not on file  Occupational History  . 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 Smoking status: Never Smoker  . Smokeless tobacco: Never Used  Substance and Sexual Activity  . Alcohol use: No    Alcohol/week: 0.0 oz  . Drug use: No  . Sexual activity: Yes    Birth control/protection: Post-menopausal  Other Topics Concern  . Not on file  Social History Narrative   She is relatively recently remarried.  She has 2  children and 3 grandchildren.     She is currently retired.  But enjoys cleaning house and doing chores.  She likes to do yard work.  Does not routinely exercise.    Past Surgical Hx:  Past Surgical History:  Procedure Laterality Date  . ANKLE SURGERY Right 1956  . CARPAL TUNNEL RELEASE Bilateral 1986, 1993  . COLONOSCOPY  11/09/2011  . COLONOSCOPY N/A 06/24/2015   Procedure: COLONOSCOPY;  Surgeon: Gatha Mayer, MD;  Location: [facility];  Service: Endoscopy;  Laterality: N/A;  . COLONOSCOPY W/ POLYPECTOMY  10/21/2009  . DILATATION & CURETTAGE/HYSTEROSCOPY WITH MYOSURE N/A 08/09/2017   Procedure: DILATATION & CURETTAGE/HYSTEROSCOPY WITH MYOSURE;  Surgeon: Christophe Louis, MD;  Location: [facility];  Service: Gynecology;  Laterality: N/A;  Polypectomy  . DILATION AND CURETTAGE OF UTERUS     PMB  . HAND SURGERY Bilateral 1999   Thumbs   . KNEE SURGERY Bilateral 1998   x2  . MULTIPLE TOOTH EXTRACTIONS    . NASAL SINUS SURGERY  1976  . polio surgery.     right foot - right leg 1.5" shorter than left leg - some wekness  . THUMB ARTHROSCOPY  1999  . TONSILLECTOMY  2752   72 years old  . TRANSTHORACIC ECHOCARDIOGRAM  07/2017   EF 60-65%, Gr 2 DD. Mod AS (mean gradient 23 mmHg), mild AI   . TUBAL LIGATION    . UPPER GI ENDOSCOPY     normal  . WISDOM TOOTH EXTRACTION      Past Medical  Hx:  Past Medical History:  Diagnosis Date  . Allergic rhinitis   . Arthritis    knees  . Cataracts, bilateral    early cataracts-just watching   . Depression   . Depression with anxiety   . Fibromyalgia    tx mobic  . Full dentures   . GERD (gastroesophageal reflux disease)   . Gout   . Hearing loss    wears bilateral hearing aids  . Hypertension   . IBS (irritable bowel syndrome)  with constipation  . Moderate aortic stenosis by prior echocardiogram 07/2017   Previously diagnosed with murmur back in 2013.  2D echo January 2019: Moderate AS. Mean gradient 23 mmHg.  . Osteopenia   . Polio age 62 months   right leg weakness and 1.5 inches shorter than left leg.   . Sinusitis, chronic   . SVD (spontaneous vaginal delivery)    x 2  . Urinary incontinence   . Uterine cancer ([place]) 08/2017   Diagnosed with D&C/hysteroscopy with polypectomy    Past Gynecological History:  Endometrial cancer. No LMP recorded. Patient is postmenopausal.  Family Hx:  Family History  Problem Relation Age of Onset  . Kidney disease Mother   . Alzheimer's disease Mother   . Stroke Father 95  . Colon cancer Father 73  . Kidney disease Sister   . Heart attack Sister   . Cancer Paternal Aunt        COLON CA  . Colon cancer Paternal Aunt 70    Review of Systems:  Constitutional  Feels well,    ENT Normal appearing ears and nares bilaterally Skin/Breast  No rash, sores, jaundice, itching, dryness Cardiovascular  No chest pain, shortness of breath, or edema  Pulmonary  No cough or wheeze.  Gastro Intestinal  No nausea, vomitting, or diarrhoea. No bright red blood per rectum, no abdominal pain, change in bowel movement, or constipation.  Genito Urinary  No frequency, urgency, dysuria, + postmenopausal bleeding. Musculo Skeletal  No myalgia, arthralgia, joint swelling or pain  Neurologic  No weakness, numbness, change in gait,  Psychology  No depression, anxiety, insomnia.    Vitals:  Blood pressure (!) 147/62, pulse 70, temperature 98 F (36.7 C), temperature source Oral, resp. rate 20, weight 191 lb 9.6 oz (86.9 kg), SpO2 99 %.  Physical Exam: WD in NAD Neck  Supple NROM, without any enlargements.  Lymph Node Survey No cervical supraclavicular or inguinal adenopathy Cardiovascular  Pulse normal rate, regularity and rhythm.  Systolic murmur.  Lungs  Clear to auscultation bilateraly, without wheezes/crackles/rhonchi. Good air movement.  Skin  No rash/lesions/breakdown  Psychiatry  Alert and oriented to person, place, and time  Abdomen  Normoactive bowel sounds, abdomen soft, non-tender and obese without evidence of hernia.  Back No CVA tenderness Genito Urinary  Vulva/vagina: Normal external female genitalia.   No lesions. No discharge or bleeding.  Bladder/urethra:  No lesions or masses, well supported bladder  Vagina: normal  Cervix: Normal appearing, no lesions.  Uterus:  Small, mobile, no parametrial involvement or nodularity.  Adnexa: no palpable masses. Rectal  deferred Extremities  No bilateral cyanosis, clubbing or edema.   Thereasa Solo, MD  09/12/2017, 11:03 AM

## 2017-09-12 NOTE — H&P (View-Only) (Signed)
Consult Note: Gyn-Onc  Consult was requested by Dr. Landry Mellow for the evaluation of Veronica Greene 72 y.o. female  CC:  Chief Complaint  Patient presents with  . Endometrial cancer [facility])    Assessment/Plan:  Veronica Greene  is a 72 y.o.  year old with grade 1 endometrial cancer and aortic stenosis.   A detailed discussion was held with the patient and her family with regard to to her endometrial cancer diagnosis. We discussed the standard management options for uterine cancer which includes surgery followed possibly by adjuvant therapy depending on the results of surgery. The options for surgical management include a hysterectomy and removal of the tubes and ovaries possibly with removal of pelvic and para-aortic lymph nodes.If feasible, a minimally invasive approach including a robotic hysterectomy or laparoscopic hysterectomy have benefits including shorter hospital stay, recovery time and better wound healing than with open surgery. The patient has been counseled about these surgical options and the risks of surgery in general including infection, bleeding, damage to surrounding structures (including bowel, bladder, ureters, nerves or vessels), and the postoperative risks of PE/ DVT, and lymphedema. I extensively reviewed the additional risks of robotic hysterectomy including possible need for conversion to open laparotomy.  I discussed positioning during surgery of trendelenberg and risks of minor facial swelling and care we take in preoperative positioning.  After counseling and consideration of her options, she desires to proceed with robotic assisted total hysterectomy with bilateral sapingo-oophorectomy and SLN biopsy.   She has aortic stenosis (asymptomatic), moderate. She has recently seen her cardiologist prior to her D&C and was "cleared" for this procedure. We will enquire if the added complexity of this procedure (2 hours in steep trendelenberg with elevated intra-abdominal pressure) will  change this recommendation.  Will hold ASA preop.  She will be seen by anesthesia for preoperative clearance and discussion of postoperative pain management.  She was given the opportunity to ask questions, which were answered to her satisfaction, and she is agreement with the above mentioned plan of care.   HPI: Veronica Greene is a 72 year old parous woman who is seen in consultation at the request of Dr Landry Mellow for grade 1 endometrial cancer.  She first began vaginal spotting in July 2017.  She was seen for an annual visit with Dr. Landry Mellow in October 2018 and reported this symptom.  A transvaginal ultrasound scan was performed on June 25, 2017 and revealed a normal-sized uterus measuring 7 x 4 x 2.7 cm with normal ovaries bilaterally.  The endometrial thickness was 1.28 cm.  On saline infusion the endometrial hyperechoic mass measured 7 mm.  She was noted to have a systolic cardiac murmur and was seen by cardiology who performed a transthoracic echo on July 30, 2017.  This revealed moderate aortic stenosis.  The left ventricle was grossly normal in cavity size, though with some increased ventricular wall thickness.  Ejection fraction was 60-65%.  She was cleared for surgical procedure.  Of note she was asymptomatic from this aortic stenosis.  She has a remote history of rheumatic heart disease as a child.  On August 09, 2017 she underwent a hysteroscopy D&C.  Final pathology confirmed a FIGO grade 1 endometrioid endometrial adenocarcinoma.  The patient is otherwise very healthy.  She has had no prior abdominal surgeries.  Current Meds:  Outpatient Encounter Medications as of 09/12/2017  Medication Sig  . acetaminophen (TYLENOL 8 HOUR ARTHRITIS PAIN) 650 MG CR tablet Take 650 mg by mouth at bedtime.  Marland Kitchen  Ascorbic Acid (VITAMIN C) 1000 MG tablet Take 1,000 mg by mouth daily.    Marland Kitchen aspirin 81 MG EC tablet Take 1 tablet (81 mg total) by mouth daily. DO NOT TAKE AGAIN UNTIL 07/11/2015 (Patient taking  differently: Take 81 mg by mouth at bedtime. )  . Calcium-Vitamin D-Vitamin K (VIACTIV) 604-540-98 MG-UNT-MCG CHEW Chew 1 tablet by mouth 2 (two) times daily. Morning & afternoon  . cetirizine (ZYRTEC) 10 MG tablet Take 5 mg by mouth at bedtime as needed for allergies (alternates between mucinex & zyrtec for sinus symptoms).  . cholecalciferol (VITAMIN D) 1000 UNITS tablet Take 1,000 Units by mouth daily with breakfast.   . colchicine 0.6 MG tablet Take 1 tablet (0.6 mg total) by mouth 2 (two) times daily. (Patient taking differently: Take 0.6 mg by mouth 2 (two) times daily as needed (for gout flare ups.). )  . conjugated estrogens (PREMARIN) vaginal cream Place 1 Applicatorful vaginally every Saturday.   . esomeprazole (NEXIUM) 20 MG capsule Take 40 mg by mouth daily before breakfast.  . fluticasone (FLONASE) 50 MCG/ACT nasal spray Place 2 sprays into both nostrils at bedtime.   Marland Kitchen guaiFENesin (MUCINEX) 600 MG 12 hr tablet Take 600 mg by mouth at bedtime as needed (alternates between mucinex & zyrtec for sinus symptoms).  . hydrochlorothiazide (HYDRODIURIL) 25 MG tablet Take 1 tablet (25 mg total) by mouth daily. (Patient taking differently: Take 25 mg by mouth daily with lunch. )  . Hydrocortisone-Acetic Acid (ACETIC ACID-HC DROPS OT) Place 1-2 drops in ear(s) at bedtime as needed (for itching).  Marland Kitchen ibuprofen (ADVIL,MOTRIN) 800 MG tablet Take 1 tablet (800 mg total) by mouth every 8 (eight) hours as needed.  . lubiprostone (AMITIZA) 24 MCG capsule TAKE 1 CAPSULE BY MOUTH TWICE A DAY. (Patient taking differently: Take 24 mcg by mouth 2 (two) times daily. LUNCH & SUPPER)  . meloxicam (MOBIC) 7.5 MG tablet Take 1 tablet (7.5 mg total) by mouth daily after breakfast. (Patient taking differently: Take 7.5 mg by mouth at bedtime. )  . Misc Natural Products (OSTEO BI-FLEX JOINT SHIELD PO) Take 1 tablet by mouth daily.   . Multiple Vitamin (MULTIVITAMIN WITH MINERALS) TABS tablet Take 1 tablet by mouth  daily. WOMEN'S MULTIVITAMIN  . Polyethyl Glycol-Propyl Glycol (SYSTANE) 0.4-0.3 % SOLN Place 1 drop into both eyes 2 (two) times daily.  . potassium chloride (K-DUR) 10 MEQ tablet Take 2 tablets (20 mEq total) by mouth 2 (two) times daily.  . sennosides-docusate sodium (SENOKOT-S) 8.6-50 MG tablet Take 1 tablet by mouth at bedtime.   . Simethicone (PHAZYME PO) Take 1 tablet by mouth 4 (four) times daily as needed (for gas (typically one tablet at night)).   . sodium chloride (AYR) 0.65 % nasal spray Place 1 spray into the nose 4 (four) times daily as needed for congestion.  . triamcinolone (KENALOG) 0.025 % cream Apply 1 application topically 2 (two) times daily. (Patient taking differently: Apply 1 application topically 2 (two) times daily as needed (for dry/itchy skin.). )  . venlafaxine XR (EFFEXOR XR) 75 MG 24 hr capsule Take 1 capsule (75 mg total) by mouth daily.   No facility-administered encounter medications on file as of 09/12/2017.     Allergy:  Allergies  Allergen Reactions  . Clarithromycin Swelling    throat swells  . Codeine Nausea And Vomiting  . Paroxetine Other (See Comments)    Ineffective.  Marland Kitchen Penicillins     Has patient had a PCN reaction causing immediate rash,  facial/tongue/throat swelling, SOB or lightheadedness with hypotension: No Has patient had a PCN reaction causing severe rash involving mucus membranes or skin necrosis: Unknown Has patient had a PCN reaction that required hospitalization:No Has patient had a PCN reaction occurring within the last 10 years: No If all of the above answers are "NO", then may proceed with Cephalosporin use.     Social Hx:   Social History   Socioeconomic History  . Marital status: Married    Spouse name: Not on file  . Number of children: 2  . Years of education: Not on file  . Highest education level: High school graduate  Social Needs  . Financial resource strain: Not on file  . Food insecurity - worry: Not on file  .  Food insecurity - inability: Not on file  . Transportation needs - medical: Not on file  . Transportation needs - non-medical: Not on file  Occupational History  . 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 Smoking status: Never Smoker  . Smokeless tobacco: Never Used  Substance and Sexual Activity  . Alcohol use: No    Alcohol/week: 0.0 oz  . Drug use: No  . Sexual activity: Yes    Birth control/protection: Post-menopausal  Other Topics Concern  . Not on file  Social History Narrative   She is relatively recently remarried.  She has 2  children and 3 grandchildren.     She is currently retired.  But enjoys cleaning house and doing chores.  She likes to do yard work.  Does not routinely exercise.    Past Surgical Hx:  Past Surgical History:  Procedure Laterality Date  . ANKLE SURGERY Right 1956  . CARPAL TUNNEL RELEASE Bilateral 1986, 1993  . COLONOSCOPY  11/09/2011  . COLONOSCOPY N/A 06/24/2015   Procedure: COLONOSCOPY;  Surgeon: Gatha Mayer, MD;  Location: [facility];  Service: Endoscopy;  Laterality: N/A;  . COLONOSCOPY W/ POLYPECTOMY  10/21/2009  . DILATATION & CURETTAGE/HYSTEROSCOPY WITH MYOSURE N/A 08/09/2017   Procedure: DILATATION & CURETTAGE/HYSTEROSCOPY WITH MYOSURE;  Surgeon: Christophe Louis, MD;  Location: [facility];  Service: Gynecology;  Laterality: N/A;  Polypectomy  . DILATION AND CURETTAGE OF UTERUS     PMB  . HAND SURGERY Bilateral 1999   Thumbs   . KNEE SURGERY Bilateral 1998   x2  . MULTIPLE TOOTH EXTRACTIONS    . NASAL SINUS SURGERY  1976  . polio surgery.     right foot - right leg 1.5" shorter than left leg - some wekness  . THUMB ARTHROSCOPY  1999  . TONSILLECTOMY  2487   72 years old  . TRANSTHORACIC ECHOCARDIOGRAM  07/2017   EF 60-65%, Gr 2 DD. Mod AS (mean gradient 23 mmHg), mild AI   . TUBAL LIGATION    . UPPER GI ENDOSCOPY     normal  . WISDOM TOOTH EXTRACTION      Past Medical  Hx:  Past Medical History:  Diagnosis Date  . Allergic rhinitis   . Arthritis    knees  . Cataracts, bilateral    early cataracts-just watching   . Depression   . Depression with anxiety   . Fibromyalgia    tx mobic  . Full dentures   . GERD (gastroesophageal reflux disease)   . Gout   . Hearing loss    wears bilateral hearing aids  . Hypertension   . IBS (irritable bowel syndrome)  with constipation  . Moderate aortic stenosis by prior echocardiogram 07/2017   Previously diagnosed with murmur back in 2013.  2D echo January 2019: Moderate AS. Mean gradient 23 mmHg.  . Osteopenia   . Polio age 71 months   right leg weakness and 1.5 inches shorter than left leg.   . Sinusitis, chronic   . SVD (spontaneous vaginal delivery)    x 2  . Urinary incontinence   . Uterine cancer ([place]) 08/2017   Diagnosed with D&C/hysteroscopy with polypectomy    Past Gynecological History:  Endometrial cancer. No LMP recorded. Patient is postmenopausal.  Family Hx:  Family History  Problem Relation Age of Onset  . Kidney disease Mother   . Alzheimer's disease Mother   . Stroke Father 21  . Colon cancer Father 13  . Kidney disease Sister   . Heart attack Sister   . Cancer Paternal Aunt        COLON CA  . Colon cancer Paternal Aunt 65    Review of Systems:  Constitutional  Feels well,    ENT Normal appearing ears and nares bilaterally Skin/Breast  No rash, sores, jaundice, itching, dryness Cardiovascular  No chest pain, shortness of breath, or edema  Pulmonary  No cough or wheeze.  Gastro Intestinal  No nausea, vomitting, or diarrhoea. No bright red blood per rectum, no abdominal pain, change in bowel movement, or constipation.  Genito Urinary  No frequency, urgency, dysuria, + postmenopausal bleeding. Musculo Skeletal  No myalgia, arthralgia, joint swelling or pain  Neurologic  No weakness, numbness, change in gait,  Psychology  No depression, anxiety, insomnia.    Vitals:  Blood pressure (!) 147/62, pulse 70, temperature 98 F (36.7 C), temperature source Oral, resp. rate 20, weight 191 lb 9.6 oz (86.9 kg), SpO2 99 %.  Physical Exam: WD in NAD Neck  Supple NROM, without any enlargements.  Lymph Node Survey No cervical supraclavicular or inguinal adenopathy Cardiovascular  Pulse normal rate, regularity and rhythm.  Systolic murmur.  Lungs  Clear to auscultation bilateraly, without wheezes/crackles/rhonchi. Good air movement.  Skin  No rash/lesions/breakdown  Psychiatry  Alert and oriented to person, place, and time  Abdomen  Normoactive bowel sounds, abdomen soft, non-tender and obese without evidence of hernia.  Back No CVA tenderness Genito Urinary  Vulva/vagina: Normal external female genitalia.   No lesions. No discharge or bleeding.  Bladder/urethra:  No lesions or masses, well supported bladder  Vagina: normal  Cervix: Normal appearing, no lesions.  Uterus:  Small, mobile, no parametrial involvement or nodularity.  Adnexa: no palpable masses. Rectal  deferred Extremities  No bilateral cyanosis, clubbing or edema.   Thereasa Solo, MD  09/12/2017, 11:03 AM

## 2017-09-12 NOTE — Patient Instructions (Signed)
Preparing for your Surgery  Plan for surgery on October 08, 2017 with Dr. [name] at [place] will be scheduled for a robotic assisted total hysterectomy, bilateral salpingo-oophorectomy, sentinel lymph node biopsy.  STOP ASPIRIN NOW  Pre-operative Testing -You will receive a phone call from presurgical testing at [facility] to arrange for a pre-operative testing appointment before your surgery.  This appointment normally occurs one to two weeks before your scheduled surgery.   -Bring your insurance card, copy of an advanced directive if applicable, medication list  -At that visit, you will be asked to sign a consent for a possible blood transfusion in case a transfusion becomes necessary during surgery.  The need for a blood transfusion is rare but having consent is a necessary part of your care.     -You should not be taking blood thinners or aspirin at least ten days prior to surgery unless instructed by your surgeon.  Day Before Surgery at [place] will be asked to take in a light diet the day before surgery.  Avoid carbonated beverages.  You will be advised to have nothing to eat or drink after midnight the evening before.    Eat a light diet the day before surgery.  Examples including soups, broths, toast, yogurt, mashed potatoes.  Things to avoid include carbonated beverages (fizzy beverages), raw fruits and raw vegetables, or beans.   If your bowels are filled with gas, your surgeon will have difficulty visualizing your pelvic organs which increases your surgical risks.  Your role in recovery Your role is to become active as soon as directed by your doctor, while still giving yourself time to heal.  Rest when you feel tired. You will be asked to do the following in order to speed your recovery:  - Cough and breathe deeply. This helps toclear and expand your lungs and can prevent pneumonia. You may be given a spirometer to practice deep  breathing. A staff member will show you how to use the spirometer. - Do mild physical activity. Walking or moving your legs help your circulation and body functions return to normal. A staff member will help you when you try to walk and will provide you with simple exercises. Do not try to get up or walk alone the first time. - Actively manage your pain. Managing your pain lets you move in comfort. We will ask you to rate your pain on a scale of zero to 10. It is your responsibility to tell your doctor or nurse where and how much you hurt so your pain can be treated.  Special Considerations -If you are diabetic, you may be placed on insulin after surgery to have closer control over your blood sugars to promote healing and recovery.  This does not mean that you will be discharged on insulin.  If applicable, your oral antidiabetics will be resumed when you are tolerating a solid diet.  -Your final pathology results from surgery should be available by the Friday after surgery and the results will be relayed to you when available.  -Dr. Lahoma Crocker is the Surgeon that assists your GYN Oncologist with surgery.  The next day after your surgery you will either see your GYN Oncologist or Dr. Lahoma Crocker.   Blood Transfusion Information WHAT IS A BLOOD TRANSFUSION? A transfusion is the replacement of blood or some of its parts. Blood is made up of multiple cells which provide different functions.  Red blood cells carry oxygen and  are used for blood loss replacement.  White blood cells fight against infection.  Platelets control bleeding.  Plasma helps clot blood.  Other blood products are available for specialized needs, such as hemophilia or other clotting disorders. BEFORE THE TRANSFUSION  Who gives blood for transfusions?   You may be able to donate blood to be used at a later date on yourself (autologous donation).  Relatives can be asked to donate blood. This is generally not  any safer than if you have received blood from a stranger. The same precautions are taken to ensure safety when a relative's blood is donated.  Healthy volunteers who are fully evaluated to make sure their blood is safe. This is blood bank blood. Transfusion therapy is the safest it has ever been in the practice of medicine. Before blood is taken from a donor, a complete history is taken to make sure that person has no history of diseases nor engages in risky social behavior (examples are intravenous drug use or sexual activity with multiple partners). The donor's travel history is screened to minimize risk of transmitting infections, such as malaria. The donated blood is tested for signs of infectious diseases, such as HIV and hepatitis. The blood is then tested to be sure it is compatible with you in order to minimize the chance of a transfusion reaction. If you or a relative donates blood, this is often done in anticipation of surgery and is not appropriate for emergency situations. It takes many days to process the donated blood. RISKS AND COMPLICATIONS Although transfusion therapy is very safe and saves many lives, the main dangers of transfusion include:   Getting an infectious disease.  Developing a transfusion reaction. This is an allergic reaction to something in the blood you were given. Every precaution is taken to prevent this. The decision to have a blood transfusion has been considered carefully by your caregiver before blood is given. Blood is not given unless the benefits outweigh the risks.

## 2017-09-19 ENCOUNTER — Telehealth: Payer: Self-pay | Admitting: *Deleted

## 2017-09-19 NOTE — Telephone Encounter (Signed)
Copied from [place] [id number]. Topic: General - Other >> Sep 19, 2017  2:39 PM Veronica Greene wrote: Reason for CRM: Patient wanted Dr Glori Bickers to know she has been dx with CA and is having a hysterectomy on 10/08/17 by Dr Landry Mellow.

## 2017-09-19 NOTE — Telephone Encounter (Signed)
Aware-thanks for letting me know  

## 2017-09-23 ENCOUNTER — Telehealth: Payer: Self-pay | Admitting: *Deleted

## 2017-09-23 NOTE — Telephone Encounter (Signed)
Returned the patient's call, she stated "I ned to make sure Dr. Denman George knows I have stopped my ASA and estrogen cream since before the D&C. Also do I need to stop the osteo bi-flex?" Advised the patient that Melissa and Dr. Denman George are out of the office and We will call her tomorrow.

## 2017-09-24 NOTE — Telephone Encounter (Signed)
Per Melissa APP the patient is ok to stop her vitamins and osteo bi-flex. Called and left the patient a message with the above.

## 2017-10-01 ENCOUNTER — Encounter (HOSPITAL_COMMUNITY): Payer: Self-pay | Admitting: *Deleted

## 2017-10-01 NOTE — Patient Instructions (Addendum)
Veronica Greene  10/01/2017   Your procedure is scheduled on: Tuesday 10-08-17  Report to [facility] Main  Entrance  Report to admitting at 10:30 AM   Call this number if you have problems the morning of surgery [phone]   Remember: Do not eat food :After Midnight.  NO SOLID FOOD AFTER MIDNIGHT THE NIGHT PRIOR TO SURGERY. NOTHING BY MOUTH EXCEPT CLEAR LIQUIDS UNTIL 3 HOURS PRIOR TO [place] SURGERY. PLEASE FINISH ENSURE DRINK PER SURGEON ORDER 3 HOURS PRIOR TO SCHEDULED SURGERY TIME WHICH NEEDS TO BE COMPLETED AT 9:30 AM.  LIGHT DIET DAY BEFORE SURGERY ON Monday  10-07-17 SEE LIGHT DIET INSTRUCTIONS BELOW.  CLEAR LIQUID DIET   Foods Allowed                                                                     Foods Excluded  Coffee and tea, regular and decaf                             liquids that you cannot  Plain Jell-O in any flavor                                             see through such as: Fruit ices (not with fruit pulp)                                     milk, soups, orange juice  Iced Popsicles                                    All solid food                                   Cranberry, grape and apple juices Sports drinks like Gatorade Lightly seasoned clear broth or consume(fat free) Sugar, honey syrup  Sample Menu Breakfast                                Lunch                                     Supper Cranberry juice                    Beef broth                            Chicken broth Jell-O  Grape juice                           Apple juice Coffee or tea                        Jell-O                                      Popsicle                                                Coffee or tea                        Coffee or tea  _____________________________________________________________________ Eat a light diet the day before surgery.  Examples including soups, broths, toast, yogurt, mashed potatoes.   Things to avoid include carbonated beverages (fizzy beverages), raw fruits and raw vegetables, or beans.   If your bowels are filled with gas, your surgeon will have difficulty visualizing your pelvic organs which increases your surgical risks.   Take these medicines the morning of surgery with A SIP OF WATER: ACETAMINOPHEN (TYLENOL) IF NEEDED, NEXIUM, SYSTANE EYE DROP,EFFEXOR             You may not have any metal on your body including hair pins and              piercings  Do not wear jewelry, make-up, lotions, powders or perfumes, deodorant             Do not wear nail polish.  Do not shave  48 hours prior to surgery.                Do not bring valuables to the hospital. [place].  Contacts, dentures or bridgework may not be worn into surgery.  Leave suitcase in the car. After surgery it may be brought to your room.   Special instructions:   Cough and Deep breathing ;  Leg exercises                Please read over the following fact sheets you were given: _____________________________________________________________________  [facility] - Preparing for Surgery Before surgery, you can play an important role.  Because skin is not sterile, your skin needs to be as free of germs as possible.  You can reduce the number of germs on your skin by washing with CHG (chlorahexidine gluconate) soap before surgery.  CHG is an antiseptic cleaner which kills germs and bonds with the skin to continue killing germs even after washing. Please DO NOT use if you have an allergy to CHG or antibacterial soaps.  If your skin becomes reddened/irritated stop using the CHG and inform your nurse when you arrive at Short Stay. Do not shave (including legs and underarms) for at least 48 hours prior to the first CHG shower.  You may shave your face/neck. Please follow these instructions carefully:  1.  Shower with CHG Soap the night before surgery and the  morning of  Surgery.  2.  If you choose to wash  your hair, wash your hair first as usual with your  normal  shampoo.  3.  After you shampoo, rinse your hair and body thoroughly to remove the  shampoo.                           4.  Use CHG as you would any other liquid soap.  You can apply chg directly  to the skin and wash                       Gently with a scrungie or clean washcloth.  5.  Apply the CHG Soap to your body ONLY FROM THE NECK DOWN.   Do not use on face/ open                           Wound or open sores. Avoid contact with eyes, ears mouth and genitals (private parts).                       Wash face,  Genitals (private parts) with your normal soap.             6.  Wash thoroughly, paying special attention to the area where your surgery  will be performed.  7.  Thoroughly rinse your body with warm water from the neck down.  8.  DO NOT shower/wash with your normal soap after using and rinsing off  the CHG Soap.                9.  Pat yourself dry with a clean towel.            10.  Wear clean pajamas.            11.  Place clean sheets on your bed the night of your first shower and do not  sleep with pets. Day of Surgery : Do not apply any lotions/deodorants the morning of surgery.  Please wear clean clothes to the hospital/surgery center.  FAILURE TO FOLLOW THESE INSTRUCTIONS MAY RESULT IN THE CANCELLATION OF YOUR SURGERY PATIENT SIGNATURE_________________________________  NURSE SIGNATURE__________________________________  ________________________________________________________________________   Veronica Greene  An incentive spirometer is a tool that can help keep your lungs clear and active. This tool measures how well you are filling your lungs with each breath. Taking long deep breaths may help reverse or decrease the chance of developing breathing (pulmonary) problems (especially infection) following:  A long period of time when you are unable to move or be active. BEFORE  THE PROCEDURE   If the spirometer includes an indicator to show your best effort, your nurse or respiratory therapist will set it to a desired goal.  If possible, sit up straight or lean slightly forward. Try not to slouch.  Hold the incentive spirometer in an upright position. INSTRUCTIONS FOR USE  1. Sit on the edge of your bed if possible, or sit up as far as you can in bed or on a chair. 2. Hold the incentive spirometer in an upright position. 3. Breathe out normally. 4. Place the mouthpiece in your mouth and seal your lips tightly around it. 5. Breathe in slowly and as deeply as possible, raising the piston or the ball toward the top of the column. 6. Hold your breath for 3-5 seconds or for as long as possible. Allow the piston or ball to fall to the bottom of  the column. 7. Remove the mouthpiece from your mouth and breathe out normally. 8. Rest for a few seconds and repeat Steps 1 through 7 at least 10 times every 1-2 hours when you are awake. Take your time and take a few normal breaths between deep breaths. 9. The spirometer may include an indicator to show your best effort. Use the indicator as a goal to work toward during each repetition. 10. After each set of 10 deep breaths, practice coughing to be sure your lungs are clear. If you have an incision (the cut made at the time of surgery), support your incision when coughing by placing a pillow or rolled up towels firmly against it. Once you are able to get out of bed, walk around indoors and cough well. You may stop using the incentive spirometer when instructed by your caregiver.  RISKS AND COMPLICATIONS  Take your time so you do not get dizzy or light-headed.  If you are in pain, you may need to take or ask for pain medication before doing incentive spirometry. It is harder to take a deep breath if you are having pain. AFTER USE  Rest and breathe slowly and easily.  It can be helpful to keep track of a log of your progress.  Your caregiver can provide you with a simple table to help with this. If you are using the spirometer at home, follow these instructions: [place] IF:   You are having difficultly using the spirometer.  You have trouble using the spirometer as often as instructed.  Your pain medication is not giving enough relief while using the spirometer.  You develop fever of 100.5 F (38.1 C) or higher. SEEK IMMEDIATE MEDICAL CARE IF:   You cough up bloody sputum that had not been present before.  You develop fever of 102 F (38.9 C) or greater.  You develop worsening pain at or near the incision site. MAKE SURE YOU:   Understand these instructions.  Will watch your condition.  Will get help right away if you are not doing well or get worse. Document Released: 11/05/2006 Document Revised: 09/17/2011 Document Reviewed: 01/06/2007 ExitCare Patient Information 2014 ExitCare, [place], COUGH AND MOVE YOUR LEGS OFTEN WHILE IN BED TO PREVENT PNEUMONIA AFTER SURGERY, GET UP AND WALK AS OFTEN AS POSSIBLE AFTER SURGERY ________________________________________________________________________  WHAT IS A BLOOD TRANSFUSION? Blood Transfusion Information  A transfusion is the replacement of blood or some of its parts. Blood is made up of multiple cells which provide different functions.  Red blood cells carry oxygen and are used for blood loss replacement.  White blood cells fight against infection.  Platelets control bleeding.  Plasma helps clot blood.  Other blood products are available for specialized needs, such as hemophilia or other clotting disorders. BEFORE THE TRANSFUSION  Who gives blood for transfusions?   Healthy volunteers who are fully evaluated to make sure their blood is safe. This is blood bank blood. Transfusion therapy is the safest it has ever been in the practice of medicine. Before blood is taken from a donor, a complete history is taken to make sure that  person has no history of diseases nor engages in risky social behavior (examples are intravenous drug use or sexual activity with multiple partners). The donor's travel history is screened to minimize risk of transmitting infections, such as malaria. The donated blood is tested for signs of infectious diseases, such as HIV and hepatitis. The blood is then tested to be sure it  is compatible with you in order to minimize the chance of a transfusion reaction. If you or a relative donates blood, this is often done in anticipation of surgery and is not appropriate for emergency situations. It takes many days to process the donated blood. RISKS AND COMPLICATIONS Although transfusion therapy is very safe and saves many lives, the main dangers of transfusion include:   Getting an infectious disease.  Developing a transfusion reaction. This is an allergic reaction to something in the blood you were given. Every precaution is taken to prevent this. The decision to have a blood transfusion has been considered carefully by your caregiver before blood is given. Blood is not given unless the benefits outweigh the risks. AFTER THE TRANSFUSION  Right after receiving a blood transfusion, you will usually feel much better and more energetic. This is especially true if your red blood cells have gotten low (anemic). The transfusion raises the level of the red blood cells which carry oxygen, and this usually causes an energy increase.  The nurse administering the transfusion will monitor you carefully for complications. HOME CARE INSTRUCTIONS  No special instructions are needed after a transfusion. You may find your energy is better. Speak with your caregiver about any limitations on activity for underlying diseases you may have. SEEK MEDICAL CARE IF:   Your condition is not improving after your transfusion.  You develop redness or irritation at the intravenous (IV) site. SEEK IMMEDIATE MEDICAL CARE IF:  Any of the  following symptoms occur over the next 12 hours:  Shaking chills.  You have a temperature by mouth above 102 F (38.9 C), not controlled by medicine.  Chest, back, or muscle pain.  People around you feel you are not acting correctly or are confused.  Shortness of breath or difficulty breathing.  Dizziness and fainting.  You get a rash or develop hives.  You have a decrease in urine output.  Your urine turns a dark color or changes to pink, red, or brown. Any of the following symptoms occur over the next 10 days:  You have a temperature by mouth above 102 F (38.9 C), not controlled by medicine.  Shortness of breath.  Weakness after normal activity.  The white part of the eye turns yellow (jaundice).  You have a decrease in the amount of urine or are urinating less often.  Your urine turns a dark color or changes to pink, red, or brown. Document Released: 06/22/2000 Document Revised: 09/17/2011 Document Reviewed: 02/09/2008 [facility] Patient Information 2014 [place], [place].  _______________________________________________________________________

## 2017-10-02 ENCOUNTER — Encounter (HOSPITAL_COMMUNITY)
Admission: RE | Admit: 2017-10-02 | Discharge: 2017-10-02 | Disposition: A | Discharge status: Home / Self Care | Payer: PPO | Source: Ambulatory Visit | Attending: Gynecologic Oncology | Admitting: Gynecologic Oncology

## 2017-10-02 ENCOUNTER — Encounter (HOSPITAL_COMMUNITY): Payer: Self-pay

## 2017-10-02 ENCOUNTER — Other Ambulatory Visit: Payer: Self-pay

## 2017-10-02 DIAGNOSIS — C541 Malignant neoplasm of endometrium: Secondary | ICD-10-CM | POA: Insufficient documentation

## 2017-10-02 DIAGNOSIS — Z01812 Encounter for preprocedural laboratory examination: Secondary | ICD-10-CM | POA: Insufficient documentation

## 2017-10-02 HISTORY — DX: Personal history of colon polyps, unspecified: Z86.0100

## 2017-10-02 HISTORY — DX: Urgency of urination: R39.15

## 2017-10-02 HISTORY — DX: Personal history of colonic polyps: Z86.010

## 2017-10-02 HISTORY — DX: Personal history of other malignant neoplasm of skin: Z85.828

## 2017-10-02 LAB — CBC
HEMATOCRIT: 38.3 % (ref 36.0–46.0)
Hemoglobin: 12.8 g/dL (ref 12.0–15.0)
MCH: 30.6 pg (ref 26.0–34.0)
MCHC: 33.4 g/dL (ref 30.0–36.0)
MCV: 91.6 fL (ref 78.0–100.0)
Platelets: 227 10*3/uL (ref 150–400)
RBC: 4.18 MIL/uL (ref 3.87–5.11)
RDW: 12.9 % (ref 11.5–15.5)
WBC: 4.3 10*3/uL (ref 4.0–10.5)

## 2017-10-02 LAB — URINALYSIS, ROUTINE W REFLEX MICROSCOPIC
Bilirubin Urine: NEGATIVE
GLUCOSE, UA: NEGATIVE mg/dL
Hgb urine dipstick: NEGATIVE
Ketones, ur: NEGATIVE mg/dL
Leukocytes, UA: NEGATIVE
Nitrite: NEGATIVE
PH: 8 (ref 5.0–8.0)
Protein, ur: NEGATIVE mg/dL
Specific Gravity, Urine: 1.003 — ABNORMAL LOW (ref 1.005–1.030)

## 2017-10-02 LAB — COMPREHENSIVE METABOLIC PANEL
ALT: 18 U/L (ref 14–54)
ANION GAP: 10 (ref 5–15)
AST: 24 U/L (ref 15–41)
Albumin: 4.5 g/dL (ref 3.5–5.0)
Alkaline Phosphatase: 58 U/L (ref 38–126)
BILIRUBIN TOTAL: 0.9 mg/dL (ref 0.3–1.2)
BUN: 13 mg/dL (ref 6–20)
CO2: 28 mmol/L (ref 22–32)
Calcium: 9.7 mg/dL (ref 8.9–10.3)
Chloride: 103 mmol/L (ref 101–111)
Creatinine, Ser: 0.63 mg/dL (ref 0.44–1.00)
GFR calc Af Amer: >60 mL/min (ref >60)
Glucose, Bld: 107 mg/dL — ABNORMAL HIGH (ref 65–99)
POTASSIUM: 4.4 mmol/L (ref 3.5–5.1)
Sodium: 141 mmol/L (ref 135–145)
TOTAL PROTEIN: 7.3 g/dL (ref 6.5–8.1)

## 2017-10-02 LAB — ABO/RH: ABO/RH(D): A NEG

## 2017-10-02 NOTE — Progress Notes (Addendum)
UA result dated 10-02-2017 sent to dr Denman George in epic. EKG dated 07-23-2017 in epic. PCP clearance/ office note by dr Roque Lias tower dated 07-23-2017 in epic. Cardiology clearance/ office note by dr Shanon Brow harding dated 08-27-2017 in epic.  ECHO dated 07-30-2017 in epic.

## 2017-10-08 ENCOUNTER — Encounter (HOSPITAL_COMMUNITY): Payer: Self-pay | Admitting: Anesthesiology

## 2017-10-08 ENCOUNTER — Ambulatory Visit (HOSPITAL_COMMUNITY): Payer: PPO | Admitting: Anesthesiology

## 2017-10-08 ENCOUNTER — Ambulatory Visit (HOSPITAL_COMMUNITY)
Admission: RE | Admit: 2017-10-08 | Discharge: 2017-10-09 | Disposition: A | Discharge status: Home / Self Care | Payer: PPO | Source: Ambulatory Visit | Attending: Gynecologic Oncology | Admitting: Gynecologic Oncology

## 2017-10-08 ENCOUNTER — Encounter (HOSPITAL_COMMUNITY)
Admission: RE | Disposition: A | Discharge status: Home / Self Care | Payer: Self-pay | Source: Ambulatory Visit | Attending: Gynecologic Oncology

## 2017-10-08 ENCOUNTER — Other Ambulatory Visit: Payer: Self-pay

## 2017-10-08 DIAGNOSIS — E669 Obesity, unspecified: Secondary | ICD-10-CM | POA: Diagnosis not present

## 2017-10-08 DIAGNOSIS — C542 Malignant neoplasm of myometrium: Secondary | ICD-10-CM | POA: Diagnosis not present

## 2017-10-08 DIAGNOSIS — Z8542 Personal history of malignant neoplasm of other parts of uterus: Secondary | ICD-10-CM | POA: Diagnosis present

## 2017-10-08 DIAGNOSIS — K219 Gastro-esophageal reflux disease without esophagitis: Secondary | ICD-10-CM | POA: Insufficient documentation

## 2017-10-08 DIAGNOSIS — N83202 Unspecified ovarian cyst, left side: Secondary | ICD-10-CM | POA: Insufficient documentation

## 2017-10-08 DIAGNOSIS — I10 Essential (primary) hypertension: Secondary | ICD-10-CM | POA: Insufficient documentation

## 2017-10-08 DIAGNOSIS — Z6831 Body mass index (BMI) 31.0-31.9, adult: Secondary | ICD-10-CM | POA: Insufficient documentation

## 2017-10-08 DIAGNOSIS — C541 Malignant neoplasm of endometrium: Secondary | ICD-10-CM | POA: Diagnosis not present

## 2017-10-08 DIAGNOSIS — Z7982 Long term (current) use of aspirin: Secondary | ICD-10-CM | POA: Diagnosis not present

## 2017-10-08 DIAGNOSIS — Z880 Allergy status to penicillin: Secondary | ICD-10-CM | POA: Insufficient documentation

## 2017-10-08 DIAGNOSIS — N83201 Unspecified ovarian cyst, right side: Secondary | ICD-10-CM | POA: Insufficient documentation

## 2017-10-08 DIAGNOSIS — F418 Other specified anxiety disorders: Secondary | ICD-10-CM | POA: Diagnosis not present

## 2017-10-08 DIAGNOSIS — Z79899 Other long term (current) drug therapy: Secondary | ICD-10-CM | POA: Diagnosis not present

## 2017-10-08 DIAGNOSIS — Z885 Allergy status to narcotic agent status: Secondary | ICD-10-CM | POA: Insufficient documentation

## 2017-10-08 HISTORY — DX: Malignant neoplasm of endometrium: C54.1

## 2017-10-08 HISTORY — PX: LYMPH NODE BIOPSY: SHX201

## 2017-10-08 HISTORY — PX: ROBOTIC ASSISTED TOTAL HYSTERECTOMY WITH BILATERAL SALPINGO OOPHERECTOMY: SHX6086

## 2017-10-08 HISTORY — DX: Presence of external hearing-aid: Z97.4

## 2017-10-08 LAB — TYPE AND SCREEN
ABO/RH(D): A NEG
Antibody Screen: NEGATIVE

## 2017-10-08 SURGERY — HYSTERECTOMY, TOTAL, ROBOT-ASSISTED, LAPAROSCOPIC, WITH BILATERAL SALPINGO-OOPHORECTOMY
Anesthesia: General

## 2017-10-08 MED ORDER — PROMETHAZINE HCL 25 MG/ML IJ SOLN: 6.2500 mg | INTRAMUSCULAR | Status: DC | PRN

## 2017-10-08 MED ORDER — CIPROFLOXACIN IN D5W 400 MG/200ML IV SOLN
400.0000 mg | INTRAVENOUS | Status: AC
  Administered 2017-10-08: 400 mg via INTRAVENOUS
  Filled 2017-10-08: qty 200

## 2017-10-08 MED ORDER — TRAMADOL HCL 50 MG PO TABS
100.0000 mg | ORAL_TABLET | Freq: Two times a day (BID) | ORAL | Status: DC | PRN
  Administered 2017-10-08: 100 mg via ORAL
  Filled 2017-10-08: qty 2

## 2017-10-08 MED ORDER — GABAPENTIN 300 MG PO CAPS
300.0000 mg | ORAL_CAPSULE | ORAL | Status: AC
  Administered 2017-10-08: 300 mg via ORAL
  Filled 2017-10-08: qty 1

## 2017-10-08 MED ORDER — ACETAMINOPHEN 500 MG PO TABS
1000.0000 mg | ORAL_TABLET | Freq: Two times a day (BID) | ORAL | Status: DC
  Administered 2017-10-08 – 2017-10-09 (×2): 1000 mg via ORAL
  Filled 2017-10-08 (×2): qty 2

## 2017-10-08 MED ORDER — CLINDAMYCIN PHOSPHATE 900 MG/50ML IV SOLN
900.0000 mg | INTRAVENOUS | Status: AC
  Administered 2017-10-08: 900 mg via INTRAVENOUS
  Filled 2017-10-08: qty 50

## 2017-10-08 MED ORDER — ENOXAPARIN SODIUM 40 MG/0.4ML ~~LOC~~ SOLN
40.0000 mg | SUBCUTANEOUS | Status: DC
  Administered 2017-10-09: 40 mg via SUBCUTANEOUS
  Filled 2017-10-08: qty 0.4

## 2017-10-08 MED ORDER — MEPERIDINE HCL 50 MG/ML IJ SOLN: 6.2500 mg | INTRAMUSCULAR | Status: DC | PRN

## 2017-10-08 MED ORDER — KETOROLAC TROMETHAMINE 30 MG/ML IJ SOLN: 30.0000 mg | Freq: Once | INTRAMUSCULAR | Status: DC | PRN

## 2017-10-08 MED ORDER — STERILE WATER FOR INJECTION IJ SOLN
INTRAMUSCULAR | Status: AC
  Filled 2017-10-08: qty 10

## 2017-10-08 MED ORDER — KCL IN DEXTROSE-NACL 20-5-0.45 MEQ/L-%-% IV SOLN
INTRAVENOUS | Status: DC
  Administered 2017-10-08: 18:00:00 via INTRAVENOUS
  Filled 2017-10-08: qty 1000

## 2017-10-08 MED ORDER — SCOPOLAMINE 1 MG/3DAYS TD PT72
1.0000 | MEDICATED_PATCH | TRANSDERMAL | Status: DC
  Administered 2017-10-08: 1.5 mg via TRANSDERMAL
  Filled 2017-10-08: qty 1

## 2017-10-08 MED ORDER — FENTANYL CITRATE (PF) 100 MCG/2ML IJ SOLN
INTRAMUSCULAR | Status: DC | PRN
  Administered 2017-10-08: 100 ug via INTRAVENOUS

## 2017-10-08 MED ORDER — SUGAMMADEX SODIUM 200 MG/2ML IV SOLN
INTRAVENOUS | Status: AC
  Filled 2017-10-08: qty 2

## 2017-10-08 MED ORDER — GABAPENTIN 300 MG PO CAPS
300.0000 mg | ORAL_CAPSULE | Freq: Every day | ORAL | Status: AC
  Administered 2017-10-08: 300 mg via ORAL
  Filled 2017-10-08: qty 1

## 2017-10-08 MED ORDER — ONDANSETRON HCL 4 MG/2ML IJ SOLN: 4.0000 mg | Freq: Four times a day (QID) | INTRAMUSCULAR | Status: DC | PRN

## 2017-10-08 MED ORDER — HYDROMORPHONE HCL 1 MG/ML IJ SOLN: 0.2000 mg | INTRAMUSCULAR | Status: DC | PRN

## 2017-10-08 MED ORDER — SUGAMMADEX SODIUM 200 MG/2ML IV SOLN
INTRAVENOUS | Status: DC | PRN
  Administered 2017-10-08: 180 mg via INTRAVENOUS

## 2017-10-08 MED ORDER — ONDANSETRON HCL 4 MG/2ML IJ SOLN
INTRAMUSCULAR | Status: DC | PRN
  Administered 2017-10-08: 4 mg via INTRAVENOUS

## 2017-10-08 MED ORDER — PANTOPRAZOLE SODIUM 40 MG PO TBEC
40.0000 mg | DELAYED_RELEASE_TABLET | Freq: Every day | ORAL | Status: DC
  Administered 2017-10-09: 40 mg via ORAL
  Filled 2017-10-08: qty 1

## 2017-10-08 MED ORDER — SENNOSIDES-DOCUSATE SODIUM 8.6-50 MG PO TABS
2.0000 | ORAL_TABLET | Freq: Every day | ORAL | Status: DC
  Filled 2017-10-08: qty 2

## 2017-10-08 MED ORDER — LACTATED RINGERS IV SOLN
INTRAVENOUS | Status: DC
  Administered 2017-10-08 (×2): via INTRAVENOUS

## 2017-10-08 MED ORDER — DEXAMETHASONE SODIUM PHOSPHATE 4 MG/ML IJ SOLN: 4.0000 mg | INTRAMUSCULAR | Status: DC

## 2017-10-08 MED ORDER — EPHEDRINE SULFATE-NACL 50-0.9 MG/10ML-% IV SOSY
PREFILLED_SYRINGE | INTRAVENOUS | Status: DC | PRN
  Administered 2017-10-08: 10 mg via INTRAVENOUS
  Administered 2017-10-08: 7.5 mg via INTRAVENOUS
  Administered 2017-10-08 (×2): 5 mg via INTRAVENOUS

## 2017-10-08 MED ORDER — LACTATED RINGERS IR SOLN
Status: DC | PRN
  Administered 2017-10-08: 1

## 2017-10-08 MED ORDER — STERILE WATER FOR IRRIGATION IR SOLN
Status: DC | PRN
  Administered 2017-10-08: 1000 mL

## 2017-10-08 MED ORDER — DEXAMETHASONE SODIUM PHOSPHATE 10 MG/ML IJ SOLN
INTRAMUSCULAR | Status: DC | PRN
  Administered 2017-10-08: 10 mg via INTRAVENOUS

## 2017-10-08 MED ORDER — PROPOFOL 10 MG/ML IV BOLUS
INTRAVENOUS | Status: DC | PRN
  Administered 2017-10-08: 180 mg via INTRAVENOUS

## 2017-10-08 MED ORDER — ONDANSETRON HCL 4 MG PO TABS: 4.0000 mg | ORAL_TABLET | Freq: Four times a day (QID) | ORAL | Status: DC | PRN

## 2017-10-08 MED ORDER — CELECOXIB 200 MG PO CAPS
400.0000 mg | ORAL_CAPSULE | ORAL | Status: AC
  Administered 2017-10-08: 400 mg via ORAL
  Filled 2017-10-08: qty 2

## 2017-10-08 MED ORDER — VENLAFAXINE HCL ER 75 MG PO CP24
75.0000 mg | ORAL_CAPSULE | Freq: Every day | ORAL | Status: DC
  Administered 2017-10-09: 75 mg via ORAL
  Filled 2017-10-08: qty 1

## 2017-10-08 MED ORDER — OXYCODONE HCL 5 MG PO TABS: 5.0000 mg | ORAL_TABLET | ORAL | Status: DC | PRN

## 2017-10-08 MED ORDER — LIDOCAINE 2% (20 MG/ML) 5 ML SYRINGE
INTRAMUSCULAR | Status: DC | PRN
  Administered 2017-10-08: 100 mg via INTRAVENOUS

## 2017-10-08 MED ORDER — HYDROMORPHONE HCL 1 MG/ML IJ SOLN: 0.2500 mg | INTRAMUSCULAR | Status: DC | PRN

## 2017-10-08 MED ORDER — ENOXAPARIN SODIUM 40 MG/0.4ML ~~LOC~~ SOLN
40.0000 mg | SUBCUTANEOUS | Status: AC
  Administered 2017-10-08: 40 mg via SUBCUTANEOUS
  Filled 2017-10-08: qty 0.4

## 2017-10-08 MED ORDER — ROCURONIUM BROMIDE 10 MG/ML (PF) SYRINGE
PREFILLED_SYRINGE | INTRAVENOUS | Status: DC | PRN
  Administered 2017-10-08 (×2): 10 mg via INTRAVENOUS
  Administered 2017-10-08: 50 mg via INTRAVENOUS

## 2017-10-08 MED ORDER — IBUPROFEN 200 MG PO TABS
600.0000 mg | ORAL_TABLET | Freq: Four times a day (QID) | ORAL | Status: DC
  Administered 2017-10-09: 600 mg via ORAL
  Filled 2017-10-08: qty 3

## 2017-10-08 MED ORDER — ACETAMINOPHEN 500 MG PO TABS
1000.0000 mg | ORAL_TABLET | ORAL | Status: AC
  Administered 2017-10-08: 1000 mg via ORAL
  Filled 2017-10-08: qty 2

## 2017-10-08 SURGICAL SUPPLY — 44 items
APPLICATOR SURGIFLO ENDO (HEMOSTASIS) IMPLANT
BAG LAPAROSCOPIC 12 15 PORT 16 (BASKET) IMPLANT
BAG RETRIEVAL 12/15 (BASKET)
COVER BACK TABLE 60X90IN (DRAPES) ×3 IMPLANT
COVER TIP SHEARS 8 DVNC (MISCELLANEOUS) ×2 IMPLANT
COVER TIP SHEARS 8MM DA VINCI (MISCELLANEOUS) ×1
DERMABOND ADVANCED (GAUZE/BANDAGES/DRESSINGS) ×1
DERMABOND ADVANCED .7 DNX12 (GAUZE/BANDAGES/DRESSINGS) ×2 IMPLANT
DRAPE ARM DVNC X/XI (DISPOSABLE) ×8 IMPLANT
DRAPE COLUMN DVNC XI (DISPOSABLE) ×2 IMPLANT
DRAPE DA VINCI XI ARM (DISPOSABLE) ×4
DRAPE DA VINCI XI COLUMN (DISPOSABLE) ×1
DRAPE SHEET LG 3/4 BI-LAMINATE (DRAPES) ×3 IMPLANT
DRAPE SURG IRRIG POUCH 19X23 (DRAPES) ×3 IMPLANT
ELECT REM PT RETURN 15FT ADLT (MISCELLANEOUS) ×3 IMPLANT
GLOVE BIO SURGEON STRL SZ 6 (GLOVE) ×12 IMPLANT
GLOVE BIO SURGEON STRL SZ 6.5 (GLOVE) ×6 IMPLANT
GOWN STRL REUS W/ TWL LRG LVL3 (GOWN DISPOSABLE) ×4 IMPLANT
GOWN STRL REUS W/TWL LRG LVL3 (GOWN DISPOSABLE) ×2
HOLDER FOLEY CATH W/STRAP (MISCELLANEOUS) ×3 IMPLANT
IRRIG SUCT STRYKERFLOW 2 WTIP (MISCELLANEOUS) ×3
IRRIGATION SUCT STRKRFLW 2 WTP (MISCELLANEOUS) ×2 IMPLANT
KIT PROCEDURE DA VINCI SI (MISCELLANEOUS) ×1
KIT PROCEDURE DVNC SI (MISCELLANEOUS) ×2 IMPLANT
MANIPULATOR UTERINE 4.5 ZUMI (MISCELLANEOUS) ×3 IMPLANT
NEEDLE SPNL 18GX3.5 QUINCKE PK (NEEDLE) ×3 IMPLANT
OBTURATOR OPTICAL STANDARD 8MM (TROCAR) ×1
OBTURATOR OPTICAL STND 8 DVNC (TROCAR) ×2
OBTURATOR OPTICALSTD 8 DVNC (TROCAR) ×2 IMPLANT
PACK ROBOT GYN CUSTOM WL (TRAY / TRAY PROCEDURE) ×3 IMPLANT
PAD POSITIONING PINK XL (MISCELLANEOUS) ×3 IMPLANT
POUCH SPECIMEN RETRIEVAL 10MM (ENDOMECHANICALS) IMPLANT
SEAL CANN UNIV 5-8 DVNC XI (MISCELLANEOUS) ×8 IMPLANT
SEAL XI 5MM-8MM UNIVERSAL (MISCELLANEOUS) ×4
SET TRI-LUMEN FLTR TB AIRSEAL (TUBING) ×3 IMPLANT
SURGIFLO W/THROMBIN 8M KIT (HEMOSTASIS) IMPLANT
SUT VIC AB 0 CT1 27 (SUTURE) ×1
SUT VIC AB 0 CT1 27XBRD ANTBC (SUTURE) ×2 IMPLANT
SYR 10ML LL (SYRINGE) ×3 IMPLANT
TOWEL OR NON WOVEN STRL DISP B (DISPOSABLE) ×3 IMPLANT
TRAP SPECIMEN MUCOUS 40CC (MISCELLANEOUS) IMPLANT
TRAY FOLEY W/METER SILVER 16FR (SET/KITS/TRAYS/PACK) ×3 IMPLANT
UNDERPAD 30X30 (UNDERPADS AND DIAPERS) ×3 IMPLANT
WATER STERILE IRR 1000ML POUR (IV SOLUTION) ×6 IMPLANT

## 2017-10-08 NOTE — Anesthesia Procedure Notes (Signed)
Procedure Name: Intubation Date/Time: 10/08/2017 1:09 PM Performed by: Lavina Hamman, CRNA Pre-anesthesia Checklist: Patient identified, Emergency Drugs available, Suction available, Patient being monitored and Timeout performed Patient Re-evaluated:Patient Re-evaluated prior to induction Oxygen Delivery Method: Circle system utilized Preoxygenation: Pre-oxygenation with 100% oxygen Induction Type: IV induction Ventilation: Mask ventilation without difficulty Laryngoscope Size: Mac and 4 Grade View: Grade I Tube type: Oral Tube size: 7.0 mm Number of attempts: 1 Airway Equipment and Method: Stylet Placement Confirmation: ETT inserted through vocal cords under direct vision,  positive ETCO2,  CO2 detector and breath sounds checked- equal and bilateral Secured at: 21 cm Tube secured with: Tape Dental Injury: Teeth and Oropharynx as per pre-operative assessment  Comments: DL by EMS student, not a great view per EMS.   DL by CRNA, noted heme small amount, G1 view, ett passed easily. Oropharanyx suctioned and OG tube passed.

## 2017-10-08 NOTE — Progress Notes (Signed)
Left eye with noted redness.. Dr. Jillyn Hidden made aware.

## 2017-10-08 NOTE — Anesthesia Postprocedure Evaluation (Signed)
Anesthesia Post Note  Patient: Veronica Greene  Procedure(s) Performed: XI ROBOTIC ASSISTED TOTAL HYSTERECTOMY WITH BILATERAL SALPINGO OOPHORECTOMY (Bilateral ) LYMPH NODE BIOPSY (N/A )     Patient location during evaluation: PACU Anesthesia Type: General Level of consciousness: sedated Pain management: pain level controlled Vital Signs Assessment: post-procedure vital signs reviewed and stable Respiratory status: spontaneous breathing Cardiovascular status: stable Postop Assessment: no apparent nausea or vomiting Anesthetic complications: no    Last Vitals:  Vitals:   10/08/17 1515 10/08/17 1530  BP: 137/64 (!) 151/58  Pulse: 60 60  Resp: 19 18  Temp:    SpO2: 95% 97%    Last Pain:  Vitals:   10/08/17 1530  TempSrc:   PainSc: Asleep   Pain Goal: Patients Stated Pain Goal: 4 (10/08/17 1106)               Rose

## 2017-10-08 NOTE — Interval H&P Note (Signed)
History and Physical Interval Note:  10/08/2017 12:40 PM  Veronica Greene  has presented today for surgery, with the diagnosis of ENDOMETRIAL CANCER  The various methods of treatment have been discussed with the patient and family. After consideration of risks, benefits and other options for treatment, the patient has consented to  Procedure(s): XI ROBOTIC ASSISTED TOTAL HYSTERECTOMY WITH BILATERAL SALPINGO OOPHORECTOMY (Bilateral) LYMPH NODE BIOPSY (N/A) as a surgical intervention .  The patient's history has been reviewed, patient examined, no change in status, stable for surgery.  I have reviewed the patient's chart and labs.  Questions were answered to the patient's satisfaction.     Thereasa Solo

## 2017-10-08 NOTE — Anesthesia Preprocedure Evaluation (Signed)
Anesthesia Evaluation  Patient identified by MRN, date of birth, ID band Patient awake    Reviewed: Allergy & Precautions, NPO status , Patient's Chart, lab work & pertinent test results  Airway Mallampati: I       Dental  (+) Lower Dentures, Upper Dentures   Pulmonary    Pulmonary exam normal breath sounds clear to auscultation       Cardiovascular hypertension, Pt. on medications  Rhythm:Regular Rate:Normal + Systolic murmurs    Neuro/Psych PSYCHIATRIC DISORDERS Anxiety Depression    GI/Hepatic Neg liver ROS, GERD  Medicated and Controlled,  Endo/Other  negative endocrine ROS  Renal/GU negative Renal ROS     Musculoskeletal   Abdominal (+) + obese,   Peds  Hematology negative hematology ROS (+)   Anesthesia Other Findings EVALETTE MONTROSE  ECHO COMPLETE WO IMAGING ENHANCING AGENT  Order# [id number]  Reading physician: Nelva Bush, MD Ordering physician: Tower, [name], MD Study date: 07/30/17 Result Notes for ECHOCARDIOGRAM COMPLETE   Notes recorded by Tammi Sou, CMA on 08/02/2017 at 12:34 PM EST Pt notified of lab results and Dr. Marliss Coots comments, once she is recovered from her surgery she will call back and request the referral ------  Notes recorded by Abner Greenspan, MD on 07/30/2017 at 6:00 PM EST Released on mychart  I think her aortic valve (has some stenosis) is causing her murmur and we should set her up to see cardiology after she is done and healed with surgery  If agreeable - we can do a referral for spring    Study Result   Result status: Final result                          *[place]                  [address] [place]                        [place],  [place] [address]                            [phone]  ------------------------------------------------------------------- Transthoracic Echocardiography  Patient:    Veronica Greene, Veronica Greene MR #:        [id number] Study Date: 07/30/2017 Gender:     F Age:        72 Height:     166.4 cm Weight:     86.5 kg BSA:        2.03 m^2 Pt. Status: Room:   ATTENDING    Default, Provider [phone], [place]    Tower, [place] A  PERFORMING   [place], [place]  SONOGRAPHER  Pilar Jarvis, RVT, RDCS, RDMS  cc:  ------------------------------------------------------------------- LV EF: 60% -   65%  ------------------------------------------------------------------- History:   PMH:  Pre-operative cardiac assessment.  Murmur.  Risk factors:  Lifelong nonsmoker. Obese. Dyslipidemia.  ------------------------------------------------------------------- Study Conclusions  - Left ventricle: The cavity size was normal. Wall thickness was   increased in a pattern of mild LVH. Systolic function was normal.   The estimated ejection fraction was in the range of 60% to 65%.   Features are consistent with a pseudonormal left ventricular   filling pattern, with concomitant abnormal relaxation and   increased filling pressure (grade 2 diastolic dysfunction).   Doppler parameters are consistent with high  ventricular filling   pressure. - Aortic valve: Probably trileaflet; moderately thickened, mildly   calcified leaflets. Cusp separation was reduced. Transvalvular   velocity was increased. There was moderate stenosis. There was   probably mild regurgitation. Peak velocity (S): 350 cm/s. Mean   gradient (S): 23 mm Hg. - Mitral valve: Calcified annulus. - Left atrium    Reproductive/Obstetrics                             Anesthesia Physical Anesthesia Plan  ASA: II  Anesthesia Plan: General   Post-op Pain Management:    Induction: Intravenous  PONV Risk Score and Plan: 4 or greater and Ondansetron, Dexamethasone and Promethazine  Airway Management Planned: Oral ETT  Additional Equipment:   Intra-op Plan:   Post-operative Plan:  Extubation in OR  Informed Consent: I have reviewed the patients History and Physical, chart, labs and discussed the procedure including the risks, benefits and alternatives for the proposed anesthesia with the patient or authorized representative who has indicated his/her understanding and acceptance.   Dental advisory given  Plan Discussed with: CRNA and Surgeon  Anesthesia Plan Comments:         Anesthesia Quick Evaluation

## 2017-10-08 NOTE — Op Note (Signed)
OPERATIVE NOTE 10/08/17  Surgeon: Donaciano Eva   Assistants: Dr Lahoma Crocker (an MD assistant was necessary for tissue manipulation, management of robotic instrumentation, retraction and positioning due to the complexity of the case and hospital policies).   Anesthesia: General endotracheal anesthesia  ASA Class: 3   Pre-operative Diagnosis: endometrial cancer grade 1  Post-operative Diagnosis: same,   Operation: Robotic-assisted laparoscopic total hysterectomy with bilateral salpingoophorectomy, SLN biopsy   Surgeon: Donaciano Eva  Assistant Surgeon: Lahoma Crocker MD  Anesthesia: GET  Urine Output: 200  Operative Findings:  : 6cm normal appearing uterus, bilateral mapping, adhesions between cecum and sidewall. No gross extrauterine disease. Normal appendix, ovaries and tubes.   Estimated Blood Loss:  <20cc      Total IV Fluids: 800 ml         Specimens: uterus, cervix, bilateral tubes and ovaries, right distal and proximal SLN, right (presacral) common iliac SLN, right external iliac SLN, left obturator SLN         Complications:  None; patient tolerated the procedure well.         Disposition: PACU - hemodynamically stable.  Procedure Details  The patient was seen in the Holding Room. The risks, benefits, complications, treatment options, and expected outcomes were discussed with the patient.  The patient concurred with the proposed plan, giving informed consent.  The site of surgery properly noted/marked. The patient was identified as Veronica Greene and the procedure verified as a Robotic-assisted hysterectomy with bilateral salpingo oophorectomy with SLN biopsy. A Time Out was held and the above information confirmed.  After induction of anesthesia, the patient was draped and prepped in the usual sterile manner. Pt was placed in supine position after anesthesia and draped and prepped in the usual sterile manner. The abdominal drape was placed after the  CholoraPrep had been allowed to dry for 3 minutes.  Her arms were tucked to her side with all appropriate precautions.  The shoulders were stabilized with padded shoulder blocks applied to the acromium processes.  The patient was placed in the semi-lithotomy position in [place].  The perineum was prepped with Betadine. The patient was then prepped. Foley catheter was placed.  A sterile speculum was placed in the vagina.  The cervix was grasped with a single-tooth tenaculum. 2mg  total of ICG was injected into the cervical stroma at 2 and 9 o'clock with 1cc injected at a 1cm and 47mm depth (concentration 0.5mg /ml) in all locations. The cervix was dilated with Kennon Rounds dilators.  The ZUMI uterine manipulator with a medium colpotomizer ring was placed without difficulty.  A pneum occluder balloon was placed over the manipulator.  OG tube placement was confirmed and to suction.   Next, a 5 mm skin incision was made 1 cm below the subcostal margin in the midclavicular line.  The 5 mm Optiview port and scope was used for direct entry.  Opening pressure was under 10 mm CO2.  The abdomen was insufflated and the findings were noted as above.   At this point and all points during the procedure, the patient's intra-abdominal pressure did not exceed 15 mmHg. Next, a 10 mm skin incision was made in the umbilicus and a right and left port was placed about 10 cm lateral to the robot port on the right and left side.  A fourth arm was placed in the left lower quadrant 2 cm above and superior and medial to the anterior superior iliac spine.  All ports were placed under direct  visualization.  The patient was placed in steep Trendelenburg.  Bowel was folded away into the upper abdomen.  The robot was docked in the normal manner.  Using the robotic shears and bipolar, the adhesions between the cecum and right sidewall were taken down sharply and made hemostatic with bipolar.   The right and left peritoneum were opened parallel  to the IP ligament to open the retroperitoneal spaces bilaterally. The SLN mapping was performed in bilateral pelvic basins. The para rectal and paravesical spaces were opened up entirely with careful dissection below the level of the ureters bilaterally and to the depth of the uterine artery origin in order to skeletonize the uterine "web" and ensure visualization of all parametrial channels. The para-aortic basins were carefully exposed and evaluated for isolated para-aortic SLN's. Lymphatic channels were identified travelling to the following visualized sentinel lymph node's: right proximal and distal obturator SLN, right presacral common iliac SLN, right external iliac SLN (all of these right nodes were first in chain), and left obturator SLN. These SLN's were separated from their surrounding lymphatic tissue, removed and sent for permanent pathology.  The hysterectomy was started after the round ligament on the right side was incised and the retroperitoneum was entered and the pararectal space was developed.  The ureter was noted to be on the medial leaf of the broad ligament.  The peritoneum above the ureter was incised and stretched and the infundibulopelvic ligament was skeletonized, cauterized and cut.  The posterior peritoneum was taken down to the level of the KOH ring.  The anterior peritoneum was also taken down.  The bladder flap was created to the level of the KOH ring.  The uterine artery on the right side was skeletonized, cauterized and cut in the normal manner.  A similar procedure was performed on the left.  The colpotomy was made and the uterus, cervix, bilateral ovaries and tubes were amputated and delivered through the vagina.  Pedicles were inspected and excellent hemostasis was achieved.    The colpotomy at the vaginal cuff was closed with Vicryl on a CT1 needle in a running manner.  Irrigation was used and excellent hemostasis was achieved.  At this point in the procedure was completed.   Robotic instruments were removed under direct visulaization.  The robot was undocked. The 10 mm ports were closed with Vicryl on a UR-5 needle and the fascia was closed with 0 Vicryl on a UR-5 needle.  The skin was closed with 4-0 Vicryl in a subcuticular manner.  Dermabond was applied.  Sponge, lap and needle counts correct x 2.  The patient was taken to the recovery room in stable condition.  The vagina was swabbed with  minimal bleeding noted.   All instrument and needle counts were correct x  3.   The patient was transferred to the recovery room in a stable condition.  Donaciano Eva, MD

## 2017-10-08 NOTE — Transfer of Care (Signed)
Immediate Anesthesia Transfer of Care Note  Patient: Veronica Greene  Procedure(s) Performed: XI ROBOTIC ASSISTED TOTAL HYSTERECTOMY WITH BILATERAL SALPINGO OOPHORECTOMY (Bilateral ) LYMPH NODE BIOPSY (N/A )  Patient Location: PACU  Anesthesia Type:General  Level of Consciousness: awake  Airway & Oxygen Therapy: Patient Spontanous Breathing and Patient connected to face mask oxygen  Post-op Assessment: Report given to RN and Post -op Vital signs reviewed and stable  Post vital signs: Reviewed and stable  Last Vitals:  Vitals Value Taken Time  BP 122/63 10/08/2017  3:07 PM  Temp    Pulse 72 10/08/2017  3:10 PM  Resp 19 10/08/2017  3:10 PM  SpO2 91 % 10/08/2017  3:10 PM  Vitals shown include unvalidated device data.  Last Pain:  Vitals:   10/08/17 1048  TempSrc: Oral      Patients Stated Pain Goal: 4 (93/23/55 [id number])  Complications: No apparent anesthesia complications

## 2017-10-09 ENCOUNTER — Encounter (HOSPITAL_COMMUNITY): Payer: Self-pay | Admitting: Gynecologic Oncology

## 2017-10-09 ENCOUNTER — Telehealth: Payer: Self-pay | Admitting: *Deleted

## 2017-10-09 DIAGNOSIS — C541 Malignant neoplasm of endometrium: Secondary | ICD-10-CM | POA: Diagnosis not present

## 2017-10-09 LAB — CBC
HCT: 34.6 % — ABNORMAL LOW (ref 36.0–46.0)
HEMOGLOBIN: 11.7 g/dL — AB (ref 12.0–15.0)
MCH: 30.4 pg (ref 26.0–34.0)
MCHC: 33.8 g/dL (ref 30.0–36.0)
MCV: 89.9 fL (ref 78.0–100.0)
Platelets: 215 10*3/uL (ref 150–400)
RBC: 3.85 MIL/uL — AB (ref 3.87–5.11)
RDW: 13 % (ref 11.5–15.5)
WBC: 5.7 10*3/uL (ref 4.0–10.5)

## 2017-10-09 LAB — BASIC METABOLIC PANEL
Anion gap: 9 (ref 5–15)
BUN: 8 mg/dL (ref 6–20)
CHLORIDE: 105 mmol/L (ref 101–111)
CO2: 26 mmol/L (ref 22–32)
Calcium: 8.8 mg/dL — ABNORMAL LOW (ref 8.9–10.3)
Creatinine, Ser: 0.57 mg/dL (ref 0.44–1.00)
GFR calc Af Amer: >60 mL/min (ref >60)
GFR calc non Af Amer: >60 mL/min (ref >60)
GLUCOSE: 168 mg/dL — AB (ref 65–99)
Potassium: 3.8 mmol/L (ref 3.5–5.1)
Sodium: 140 mmol/L (ref 135–145)

## 2017-10-09 MED ORDER — DIPHENHYDRAMINE HCL 25 MG PO CAPS
50.0000 mg | ORAL_CAPSULE | Freq: Once | ORAL | Status: AC
  Administered 2017-10-09: 50 mg via ORAL
  Filled 2017-10-09: qty 2

## 2017-10-09 MED ORDER — OXYCODONE HCL 5 MG PO TABS: 5.0000 mg | ORAL_TABLET | ORAL | 0 refills | Status: DC | PRN

## 2017-10-09 NOTE — Progress Notes (Signed)
Pt alert, oriented, and tolerating diet.  PA was called concerning pt's flushed appearance and a Benadryl was given with instructions for the pt to call if if was to worsen.  D/C instructions were given and all questions were answered. Pt was d/cd home with spouse.

## 2017-10-09 NOTE — Addendum Note (Signed)
Addendum  created 10/09/17 1048 by Lavina Hamman, CRNA   Intraprocedure Staff edited

## 2017-10-09 NOTE — Discharge Summary (Signed)
Physician Discharge Summary  Patient ID: Veronica Greene MRN: [id number] DOB/AGE: 1945-11-13 72 y.o.  Admit date: 10/08/2017 Discharge date: 10/09/2017  Admission Diagnoses: Endometrial cancer [facility])  Discharge Diagnoses:  Principal Problem:   Endometrial cancer [facility]) Active Problems:   Obesity   Discharged Condition:  The patient is in good condition and stable for discharge.    Hospital Course: On 10/08/2017, the patient underwent the following: Procedure(s): XI ROBOTIC ASSISTED TOTAL HYSTERECTOMY WITH BILATERAL SALPINGO OOPHORECTOMY LYMPH NODE BIOPSY.   The postoperative course was uneventful.  She was discharged to home on postoperative day 1 tolerating a regular diet, ambulating, pain controlled.  Consults: None  Significant Diagnostic Studies: None  Treatments: surgery: see above  Discharge Exam: Blood pressure (!) 115/53, pulse 66, temperature 98.1 F (36.7 C), temperature source Oral, resp. rate 15, height 5\' 5"  (1.651 m), weight 188 lb (85.3 kg), SpO2 96 %. General appearance: alert, cooperative and no distress Resp: clear to auscultation bilaterally Cardio: regular rate, systolic murmur noted (not new for pt), no clicks or rubs GI: soft, non-tender; bowel sounds normal; no masses,  no organomegaly Extremities: extremities normal, atraumatic, no cyanosis or edema Incision/Wound: lap sites to the abdomen with dermabond without drainage  Disposition: Discharge disposition: 01-Home or Self Care       Discharge Instructions    Call MD for:  difficulty breathing, headache or visual disturbances   Complete by:  As directed    Call MD for:  extreme fatigue   Complete by:  As directed    Call MD for:  hives   Complete by:  As directed    Call MD for:  persistant dizziness or light-headedness   Complete by:  As directed    Call MD for:  persistant nausea and vomiting   Complete by:  As directed    Call MD for:  redness, tenderness, or signs of infection (pain, swelling,  redness, odor or green/yellow discharge around incision site)   Complete by:  As directed    Call MD for:  severe uncontrolled pain   Complete by:  As directed    Call MD for:  temperature >100.4   Complete by:  As directed    Diet - low sodium heart healthy   Complete by:  As directed    Driving Restrictions   Complete by:  As directed    No driving for 1 week.  Do not take narcotics and drive.   Increase activity slowly   Complete by:  As directed    Lifting restrictions   Complete by:  As directed    No lifting greater than 10 lbs.   Sexual Activity Restrictions   Complete by:  As directed    No sexual activity, nothing in the vagina, for 8 weeks.     Allergies as of 10/09/2017      Reactions   Clarithromycin Swelling   throat swells   Codeine Nausea And Vomiting   Paroxetine Other (See Comments)   Ineffective.   Penicillins Hives   welts Has patient had a PCN reaction causing immediate rash, facial/tongue/throat swelling, SOB or lightheadedness with hypotension: No Has patient had a PCN reaction causing severe rash involving mucus membranes or skin necrosis: Unknown Has patient had a PCN reaction that required hospitalization:No Has patient had a PCN reaction occurring within the last 10 years: No If all of the above answers are "NO", then may proceed with Cephalosporin use.      Medication List  TAKE these medications   ACETASOL HC OTIC solution Generic drug:  acetic acid-hydrocortisone Place 2 drops into both ears 2 (two) times daily as needed (itching).   aspirin 81 MG EC tablet Take 1 tablet (81 mg total) by mouth daily. DO NOT TAKE AGAIN UNTIL 07/11/2015 What changed:  additional instructions   AYR 0.65 % nasal spray Generic drug:  sodium chloride Place 1 spray into the nose 4 (four) times daily as needed for congestion.   cetirizine 10 MG tablet Commonly known as:  ZYRTEC Take 5 mg by mouth at bedtime.   cholecalciferol 1000 units tablet Commonly known  as:  VITAMIN D Take 1,000 Units by mouth daily with breakfast.   colchicine 0.6 MG tablet Take 1 tablet (0.6 mg total) by mouth 2 (two) times daily. What changed:    when to take this  reasons to take this   esomeprazole 20 MG capsule Commonly known as:  NEXIUM Take 40 mg by mouth daily before breakfast.   fluticasone 50 MCG/ACT nasal spray Commonly known as:  FLONASE Place 2 sprays into both nostrils at bedtime.   hydrochlorothiazide 25 MG tablet Commonly known as:  HYDRODIURIL Take 1 tablet (25 mg total) by mouth daily. What changed:  when to take this   lubiprostone 24 MCG capsule Commonly known as:  AMITIZA TAKE 1 CAPSULE BY MOUTH TWICE A DAY. What changed:    how much to take  how to take this  when to take this  additional instructions   meloxicam 7.5 MG tablet Commonly known as:  MOBIC Take 1 tablet (7.5 mg total) by mouth daily after breakfast. What changed:  when to take this   multivitamin with minerals Tabs tablet Take 1 tablet by mouth daily. WOMEN'S MULTIVITAMIN   OSTEO BI-FLEX JOINT SHIELD PO Take 1 tablet by mouth daily.   oxyCODONE 5 MG immediate release tablet Commonly known as:  Oxy IR/ROXICODONE Take 1 tablet (5 mg total) by mouth every 4 (four) hours as needed for severe pain or breakthrough pain.   PHAZYME PO Take 1 tablet by mouth 4 (four) times daily as needed (for gas (typically one tablet at night)).   potassium chloride 10 MEQ tablet Commonly known as:  K-DUR Take 2 tablets (20 mEq total) by mouth 2 (two) times daily. What changed:    how much to take  when to take this   sennosides-docusate sodium 8.6-50 MG tablet Commonly known as:  SENOKOT-S Take 1 tablet by mouth daily with supper.   SYSTANE 0.4-0.3 % Soln Generic drug:  Polyethyl Glycol-Propyl Glycol Place 1 drop into both eyes 2 (two) times daily.   TYLENOL 8 HOUR ARTHRITIS PAIN 650 MG CR tablet Generic drug:  acetaminophen Take 650 mg by mouth daily as needed  for pain.   venlafaxine XR 75 MG 24 hr capsule Commonly known as:  EFFEXOR XR Take 1 capsule (75 mg total) by mouth daily. What changed:  when to take this   VIACTIV [id number] MG-UNT-MCG Chew Generic drug:  Calcium-Vitamin D-Vitamin K Chew 1 tablet by mouth 2 (two) times daily. Morning & afternoon   vitamin C 1000 MG tablet Take 1,000 mg by mouth daily with breakfast.      Follow-up Information    [name], MD In 3 weeks.   Specialty:  Obstetrics and Gynecology Contact information: [address] [place] [address] [phone]           Greater than thirty minutes were spend for face to face  discharge instructions and discharge orders/summary in EPIC.   Signed: Dorothyann Gibbs 10/09/2017, 10:14 AM

## 2017-10-09 NOTE — Telephone Encounter (Signed)
Returned the patient's call and moved her post op appt to earlier date.

## 2017-10-09 NOTE — Discharge Instructions (Signed)
10/08/2017  Return to work: 4 weeks  Activity: 1. Be up and out of the bed during the day.  Take a nap if needed.  You may walk up steps but be careful and use the hand rail.  Stair climbing will tire you more than you think, you may need to stop part way and rest.   2. No lifting or straining for 6 weeks.  3. No driving for 1 weeks.  Do Not drive if you are taking narcotic pain medicine.  4. Shower daily.  Use soap and water on your incision and pat dry; don't rub.   5. No sexual activity and nothing in the vagina for 8 weeks.  Medications:  - Take ibuprofen and tylenol first line for pain control. Take these regularly (every 6 hours) to decrease the build up of pain.  - If necessary, for severe pain not relieved by ibuprofen, take percocet.  - While taking percocet you should take sennakot every night to reduce the likelihood of constipation. If this causes diarrhea, stop its use.  Diet: 1. Low sodium Heart Healthy Diet is recommended.  2. It is safe to use a laxative if you have difficulty moving your bowels.   Wound Care: 1. Keep clean and dry.  Shower daily.  Reasons to call the Doctor:   Fever - Oral temperature greater than 100.4 degrees Fahrenheit  Foul-smelling vaginal discharge  Difficulty urinating  Nausea and vomiting  Increased pain at the site of the incision that is unrelieved with pain medicine.  Difficulty breathing with or without chest pain  New calf pain especially if only on one side  Sudden, continuing increased vaginal bleeding with or without clots.   Follow-up: 1. See [name] in 3 weeks.  Contacts: For questions or concerns you should contact:  Dr. [name] at [phone] After hours and on week-ends call [phone] and ask to speak to the physician on call for Gynecologic Oncology  Oxycodone tablets or capsules What is this medicine? OXYCODONE (ox i KOE done) is a pain reliever. It is used to treat moderate to severe  pain. This medicine may be used for other purposes; ask your health care provider or pharmacist if you have questions. COMMON BRAND NAME(S): Dazidox, Endocodone, Oxaydo, OXECTA, OxyIR, Percolone, Roxicodone, ROXYBOND What should I tell my health care provider before I take this medicine? They need to know if you have any of these conditions: -Addison's disease -brain tumor -head injury -heart disease -history of drug or alcohol abuse problem -if you often drink alcohol -kidney disease -liver disease -lung or breathing disease, like asthma -mental illness -pancreatic disease -seizures -thyroid disease -an unusual or allergic reaction to oxycodone, codeine, hydrocodone, morphine, other medicines, foods, dyes, or preservatives -pregnant or trying to get pregnant -breast-feeding How should I use this medicine? Take this medicine by mouth with a glass of water. Follow the directions on the prescription label. You can take it with or without food. If it upsets your stomach, take it with food. Take your medicine at regular intervals. Do not take it more often than directed. Do not stop taking except on your doctor's advice. Some brands of this medicine, like Oxecta, have special instructions. Ask your doctor or pharmacist if these directions are for you: Do not cut, crush or chew this medicine. Swallow only one tablet at a time. Do not wet, soak, or lick the tablet before you take it. A special MedGuide will be given to you by the pharmacist  with each prescription and refill. Be sure to read this information carefully each time. Talk to your pediatrician regarding the use of this medicine in children. Special care may be needed. Overdosage: If you think you have taken too much of this medicine contact a poison control center or emergency room at once. NOTE: This medicine is only for you. Do not share this medicine with others. What if I miss a dose? If you miss a dose, take it as soon as you  can. If it is almost time for your next dose, take only that dose. Do not take double or extra doses. What may interact with this medicine? This medicine may interact with the following medications: -alcohol -antihistamines for allergy, cough and cold -antiviral medicines for HIV or AIDS -atropine -certain antibiotics like clarithromycin, erythromycin, linezolid, rifampin -certain medicines for anxiety or sleep -certain medicines for bladder problems like oxybutynin, tolterodine -certain medicines for depression like amitriptyline, fluoxetine, sertraline -certain medicines for fungal infections like ketoconazole, itraconazole, voriconazole -certain medicines for migraine headache like almotriptan, eletriptan, frovatriptan, naratriptan, rizatriptan, sumatriptan, zolmitriptan -certain medicines for nausea or vomiting like dolasetron, ondansetron, palonosetron -certain medicines for Parkinson's disease like benztropine, trihexyphenidyl -certain medicines for seizures like phenobarbital, phenytoin, primidone -certain medicines for stomach problems like dicyclomine, hyoscyamine -certain medicines for travel sickness like scopolamine -diuretics -general anesthetics like halothane, isoflurane, methoxyflurane, propofol -ipratropium -local anesthetics like lidocaine, pramoxine, tetracaine -MAOIs like Carbex, Eldepryl, Marplan, Nardil, and Parnate -medicines that relax muscles for surgery -methylene blue -nilotinib -other narcotic medicines for pain or cough -phenothiazines like chlorpromazine, mesoridazine, prochlorperazine, thioridazine This list may not describe all possible interactions. Give your health care provider a list of all the medicines, herbs, non-prescription drugs, or dietary supplements you use. Also tell them if you smoke, drink alcohol, or use illegal drugs. Some items may interact with your medicine. What should I watch for while using this medicine? Tell your doctor or health  care professional if your pain does not go away, if it gets worse, or if you have new or a different type of pain. You may develop tolerance to the medicine. Tolerance means that you will need a higher dose of the medicine for pain relief. Tolerance is normal and is expected if you take this medicine for a long time. Do not suddenly stop taking your medicine because you may develop a severe reaction. Your body becomes used to the medicine. This does NOT mean you are addicted. Addiction is a behavior related to getting and using a drug for a non-medical reason. If you have pain, you have a medical reason to take pain medicine. Your doctor will tell you how much medicine to take. If your doctor wants you to stop the medicine, the dose will be slowly lowered over time to avoid any side effects. There are different types of narcotic medicines (opiates). If you take more than one type at the same time or if you are taking another medicine that also causes drowsiness, you may have more side effects. Give your health care provider a list of all medicines you use. Your doctor will tell you how much medicine to take. Do not take more medicine than directed. Call emergency for help if you have problems breathing or unusual sleepiness. You may get drowsy or dizzy. Do not drive, use machinery, or do anything that needs mental alertness until you know how the medicine affects you. Do not stand or sit up quickly, especially if you are an older patient. This reduces  the risk of dizzy or fainting spells. Alcohol may interfere with the effect of this medicine. Avoid alcoholic drinks. This medicine will cause constipation. Try to have a bowel movement at least every 2 to 3 days. If you do not have a bowel movement for 3 days, call your doctor or health care professional. Your mouth may get dry. Chewing sugarless gum or sucking hard candy, and drinking plenty of water may help. Contact your doctor if the problem does not go away  or is severe. What side effects may I notice from receiving this medicine? Side effects that you should report to your doctor or health care professional as soon as possible: -allergic reactions like skin rash, itching or hives, swelling of the face, lips, or tongue -breathing problems -confusion -signs and symptoms of low blood pressure like dizziness; feeling faint or lightheaded, falls; unusually weak or tired -trouble passing urine or change in the amount of urine -trouble swallowing Side effects that usually do not require medical attention (report to your doctor or health care professional if they continue or are bothersome): -constipation -dry mouth -nausea, vomiting -tiredness This list may not describe all possible side effects. Call your doctor for medical advice about side effects. You may report side effects to FDA at 1-800-FDA-1088. Where should I keep my medicine? Keep out of the reach of children. This medicine can be abused. Keep your medicine in a safe place to protect it from theft. Do not share this medicine with anyone. Selling or giving away this medicine is dangerous and against the law. Store at room temperature between 15 and 30 degrees C (59 and 86 degrees F). Protect from light. Keep container tightly closed. This medicine may cause accidental overdose and death if it is taken by other adults, children, or pets. Flush any unused medicine down the toilet to reduce the chance of harm. Do not use the medicine after the expiration date. NOTE: This sheet is a summary. It may not cover all possible information. If you have questions about this medicine, talk to your doctor, pharmacist, or health care provider.  2018 [employer]vier/Gold Standard (2015-05-10 16:55:57)

## 2017-10-15 ENCOUNTER — Telehealth: Payer: Self-pay

## 2017-10-15 NOTE — Telephone Encounter (Signed)
Told Veronica Greene that Dr. Denman George needs to review the pathology report. She will be given the results once this review is completed.

## 2017-10-16 ENCOUNTER — Encounter: Payer: Self-pay | Admitting: Gynecologic Oncology

## 2017-10-16 ENCOUNTER — Telehealth: Payer: Self-pay | Admitting: Gynecologic Oncology

## 2017-10-16 DIAGNOSIS — C541 Malignant neoplasm of endometrium: Secondary | ICD-10-CM

## 2017-10-16 NOTE — Telephone Encounter (Signed)
Informed patient regarding pathology and recommendation for vaginal brachytherapy due to high risk features.  We will refer to Dr Sondra Come to discuss further.  Thereasa Solo, MD

## 2017-10-17 ENCOUNTER — Encounter: Payer: Self-pay | Admitting: Radiation Oncology

## 2017-10-17 NOTE — Progress Notes (Signed)
GYN Location of Tumor / Histology: Endometrial cancer   [name] presented with symptoms ON:GEXBMWU spotting which began in July 2017  Biopsies revealed:   10/08/17 Diagnosis 1. Lymph node, sentinel, biopsy, right common iliac - ONE OF ONE LYMPH NODES NEGATIVE FOR CARCINOMA (0/1). 2. Lymph node, sentinel, biopsy, right external iliac - ONE OF ONE LYMPH NODES NEGATIVE FOR CARCINOMA (0/1). 3. Lymph node, sentinel, biopsy, right distal obturator - ONE OF ONE LYMPH NODES NEGATIVE FOR CARCINOMA (0/1). 4. Lymph node, sentinel, biopsy, right proximal obturator - ONE OF ONE LYMPH NODES NEGATIVE FOR CARCINOMA (0/1). 5. Lymph node, sentinel, biopsy, left obturator - ONE OF ONE LYMPH NODES NEGATIVE FOR CARCINOMA (0/1). 6. Uterus +/- tubes/ovaries, neoplastic, cervix, bilateral tubes and ovaries - UTERUS: -ENDO/MYOMETRIUM: INVASIVE ENDOMETRIOID ADENOCARCINOMA, FIGO GRADE 1, SPANNING 3.2 CM. TUMOR INVADES MORE THAN ONE HALF OF THE MYOMETRIUM. SEE ONCOLOGY TABLE. -SEROSA: UNREMARKABLE. - CERVIX: BENIGN SQUAMOUS AND ENDOCERVICAL MUCOSA. NO DYSPLASIA OR MALIGNANCY. - BILATERAL OVARIES: INCLUSION CYSTS. NO MALIGNANCY. - BILATERAL FALLOPIAN TUBES: UNREMARKABLE. NO MALIGNANCY.  Past/Anticipated interventions by Gyn/Onc surgery, if any: 10/08/17 - Procedure: XI ROBOTIC ASSISTED TOTAL HYSTERECTOMY WITH BILATERAL SALPINGO OOPHORECTOMY;  Surgeon: [name], MD  Past/Anticipated interventions by medical oncology, if any: none  Weight changes, if any: No  Bowel/Bladder complaints, if any: Yes; Mild stress incontinence and difficulty starting stream. Also has had issues with constipation her whole life but says "it's getting better". Was advised by Dr. Denman George to start Miralax and verbalized she will start later today.  Nausea/Vomiting, if any: No  Pain issues, if any:  Mild pain in right groin area with activity but goes away on its own.  SAFETY ISSUES:  Prior radiation? No  Pacemaker/ICD?  No  Possible current pregnancy? No  Is the patient on methotrexate? No  Current Complaints / other details:  Dr. Denman George is recommending vaginal brachytherapy due to high risk features.  Patient has a history of colon cancer in her father and paternal aunt. Veronica Greene presents today for her consult. Denies any bleeding or discharge. Reports feeling tired and overwhelmed with all her other health issues on top of her new cancer diagnosis.

## 2017-10-18 ENCOUNTER — Telehealth: Payer: Self-pay | Admitting: *Deleted

## 2017-10-18 NOTE — Telephone Encounter (Signed)
Returned the patient's call and left a message to call the office back 

## 2017-10-29 ENCOUNTER — Encounter: Payer: Self-pay | Admitting: Gynecologic Oncology

## 2017-10-29 ENCOUNTER — Inpatient Hospital Stay: Payer: PPO | Attending: Gynecologic Oncology | Admitting: Gynecologic Oncology

## 2017-10-29 VITALS — BP 152/63 | HR 73 | Temp 98.0°F | Resp 16 | Ht 65.0 in | Wt 188.4 lb

## 2017-10-29 DIAGNOSIS — C541 Malignant neoplasm of endometrium: Secondary | ICD-10-CM | POA: Insufficient documentation

## 2017-10-29 DIAGNOSIS — C44722 Squamous cell carcinoma of skin of right lower limb, including hip: Secondary | ICD-10-CM | POA: Diagnosis not present

## 2017-10-29 DIAGNOSIS — Z90722 Acquired absence of ovaries, bilateral: Secondary | ICD-10-CM | POA: Diagnosis not present

## 2017-10-29 DIAGNOSIS — Z90710 Acquired absence of both cervix and uterus: Secondary | ICD-10-CM | POA: Diagnosis not present

## 2017-10-29 DIAGNOSIS — Z7189 Other specified counseling: Secondary | ICD-10-CM

## 2017-10-29 DIAGNOSIS — L570 Actinic keratosis: Secondary | ICD-10-CM | POA: Diagnosis not present

## 2017-10-29 NOTE — Patient Instructions (Signed)
Wait one more week until you work in the yard or do heavy lifting. You can resume tub baths. No sex or anything in the vagina another 5 weeks.  You will follow-up with Dr Denman George approximately 3 months after completing radiation.

## 2017-10-29 NOTE — Progress Notes (Signed)
MSI ordered on [id number] with Maudie Mercury at [facility].

## 2017-10-29 NOTE — Progress Notes (Signed)
Consult Note: Gyn-Onc  Consult was requested by Dr. Landry Mellow for the evaluation of Veronica Greene 72 y.o. female  CC:  Chief Complaint  Patient presents with  . Endometrial cancer [facility])    Assessment/Plan:  Veronica. Veronica Greene  is a 72 y.o.  year old with stage IB grade 2 endometrioid endometrial adenocarcinoma (MSI pending).  High/intermediate risk factors for recurrence. Recommendation is for vaginal brachytherapy to reduce risk for local recurrence in accordance with NCCN guidelines.  I discussed this with the patient. I discussed the role of adjuvant therapy. I discussed prognosis and risk for recurrence. We reviewed symptoms concerning for recurrence and she will see me if these develop prior to her scheduled appointment.  After completing adjuvant therapy I recommend she follow-up at 3 monthly intervals for symptom review, physical examination and pelvic examination. Pap smear is not recommended in routine endometrial cancer surveillance. After 2 years we will space these visits to every 6 months, and then annually if recurrence has not developed within 5 years. All questions were answered.   HPI: Veronica Greene is a 72 year old parous woman who is seen in consultation at the request of Dr Landry Mellow for grade 1 endometrial cancer.  She first began vaginal spotting in July 2017.  She was seen for an annual visit with Dr. Landry Mellow in October 2018 and reported this symptom.  A transvaginal ultrasound scan was performed on June 25, 2017 and revealed a normal-sized uterus measuring 7 x 4 x 2.7 cm with normal ovaries bilaterally.  The endometrial thickness was 1.28 cm.  On saline infusion the endometrial hyperechoic mass measured 7 mm.  She was noted to have a systolic cardiac murmur and was seen by cardiology who performed a transthoracic echo on July 30, 2017.  This revealed moderate aortic stenosis.  The left ventricle was grossly normal in cavity size, though with some increased ventricular wall  thickness.  Ejection fraction was 60-65%.  She was cleared for surgical procedure.  Of note she was asymptomatic from this aortic stenosis.  She has a remote history of rheumatic heart disease as a child.  On August 09, 2017 she underwent a hysteroscopy D&C.  Final pathology confirmed a FIGO grade 1 endometrioid endometrial adenocarcinoma.  The patient is otherwise very healthy.  She has had no prior abdominal surgeries.  Interval Hx:  On 10/08/17 she underwent robotic assisted total hysterectomy, BSO and SLN biopsy. Final pathology revealed a 3.2cm grade 2 endometrioid endometrial adenocarcinoma with outer half myo invasion (1 of 1.2cm) and no LVSI. All SLN's were negative as were adnexa and cervix. MSI pending. She was determined to have high/intermediate risk factors and was recommended to have vaginal brachytherapy in accordance with NCCN guidelines.  Since surgery she has done well with no complaints.   Current Meds:  Outpatient Encounter Medications as of 10/29/2017  Medication Sig  . acetaminophen (TYLENOL 8 HOUR ARTHRITIS PAIN) 650 MG CR tablet Take 650 mg by mouth daily as needed for pain.   Marland Kitchen acetic acid-hydrocortisone (ACETASOL HC) OTIC solution Place 2 drops into both ears 2 (two) times daily as needed (itching).  . Ascorbic Acid (VITAMIN C) 1000 MG tablet Take 1,000 mg by mouth daily with breakfast.   . aspirin 81 MG EC tablet Take 1 tablet (81 mg total) by mouth daily. DO NOT TAKE AGAIN UNTIL 07/11/2015 (Patient taking differently: Take 81 mg by mouth daily. )  . Calcium-Vitamin D-Vitamin K (VIACTIV) 225-834-62 MG-UNT-MCG CHEW Chew 1 tablet by mouth  2 (two) times daily. Morning & afternoon  . cetirizine (ZYRTEC) 10 MG tablet Take 5 mg by mouth at bedtime.   . cholecalciferol (VITAMIN D) 1000 UNITS tablet Take 1,000 Units by mouth daily with breakfast.   . colchicine 0.6 MG tablet Take 1 tablet (0.6 mg total) by mouth 2 (two) times daily. (Patient taking differently: Take 0.6 mg by  mouth 2 (two) times daily as needed (gout flare). )  . esomeprazole (NEXIUM) 20 MG capsule Take 40 mg by mouth daily before breakfast.  . fluticasone (FLONASE) 50 MCG/ACT nasal spray Place 2 sprays into both nostrils at bedtime.   . hydrochlorothiazide (HYDRODIURIL) 25 MG tablet Take 1 tablet (25 mg total) by mouth daily. (Patient taking differently: Take 25 mg by mouth daily with lunch. )  . lubiprostone (AMITIZA) 24 MCG capsule TAKE 1 CAPSULE BY MOUTH TWICE A DAY. (Patient taking differently: Take 24 mcg by mouth 2 (two) times daily. LUNCH & SUPPER)  . meloxicam (MOBIC) 7.5 MG tablet Take 1 tablet (7.5 mg total) by mouth daily after breakfast. (Patient taking differently: Take 7.5 mg by mouth at bedtime. )  . Misc Natural Products (OSTEO BI-FLEX JOINT SHIELD PO) Take 1 tablet by mouth daily.   . Multiple Vitamin (MULTIVITAMIN WITH MINERALS) TABS tablet Take 1 tablet by mouth daily. WOMEN'S MULTIVITAMIN  . oxyCODONE (OXY IR/ROXICODONE) 5 MG immediate release tablet Take 1 tablet (5 mg total) by mouth every 4 (four) hours as needed for severe pain or breakthrough pain.  Vladimir Faster Glycol-Propyl Glycol (SYSTANE) 0.4-0.3 % SOLN Place 1 drop into both eyes 2 (two) times daily.  . potassium chloride (K-DUR) 10 MEQ tablet Take 2 tablets (20 mEq total) by mouth 2 (two) times daily. (Patient taking differently: Take 10 mEq by mouth 4 (four) times daily. )  . sennosides-docusate sodium (SENOKOT-S) 8.6-50 MG tablet Take 1 tablet by mouth daily with supper.   . Simethicone (PHAZYME PO) Take 1 tablet by mouth 4 (four) times daily as needed (for gas (typically one tablet at night)).   . sodium chloride (AYR) 0.65 % nasal spray Place 1 spray into the nose 4 (four) times daily as needed for congestion.  Marland Kitchen venlafaxine XR (EFFEXOR XR) 75 MG 24 hr capsule Take 1 capsule (75 mg total) by mouth daily. (Patient taking differently: Take 75 mg by mouth daily after breakfast. )   No facility-administered encounter  medications on file as of 10/29/2017.     Allergy:  Allergies  Allergen Reactions  . Clarithromycin Swelling    throat swells  . Codeine Nausea And Vomiting  . Paroxetine Other (See Comments)    Ineffective.  Marland Kitchen Penicillins Hives    welts Has patient had a PCN reaction causing immediate rash, facial/tongue/throat swelling, SOB or lightheadedness with hypotension: No Has patient had a PCN reaction causing severe rash involving mucus membranes or skin necrosis: Unknown Has patient had a PCN reaction that required hospitalization:No Has patient had a PCN reaction occurring within the last 10 years: No If all of the above answers are "NO", then may proceed with Cephalosporin use.     Social Hx:   Social History   Socioeconomic History  . Marital status: Married    Spouse name: Not on file  . Number of children: 2  . Years of education: Not on file  . Highest education level: High school graduate  Occupational History  . 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 Financial resource strain: Not on file  . Food insecurity:    Worry: Not on file    Inability: Not on file  . Transportation needs:    Medical: Not on file    Non-medical: Not on file  Tobacco Use  . Smoking status: Never Smoker  . Smokeless tobacco: Never Used  Substance and Sexual Activity  . Alcohol use: No    Alcohol/week: 0.0 oz  . Drug use: No  . Sexual activity: Yes    Birth control/protection: Post-menopausal  Lifestyle  . Physical activity:    Days per week: Not on file    Minutes per session: Not on file  . Stress: Not on file  Relationships  . Social connections:    Talks on phone: Not on file    Gets together: Not on file    Attends religious service: Not on file    Active member of club or organization: Not on file    Attends meetings of clubs or organizations: Not on file    Relationship status: Not on file  . Intimate  partner violence:    Fear of current or ex partner: Not on file    Emotionally abused: Not on file    Physically abused: Not on file    Forced sexual activity: Not on file  Other Topics Concern  . Not on file  Social History Narrative   She is relatively recently remarried.  She has 2  children and 3 grandchildren.     She is currently retired.  But enjoys cleaning house and doing chores.  She likes to do yard work.  Does not routinely exercise.    Past Surgical Hx:  Past Surgical History:  Procedure Laterality Date  . ANKLE SURGERY Right 1956  . CARPAL TUNNEL RELEASE Bilateral 1986, 1993  . COLONOSCOPY N/A 06/24/2015   Procedure: COLONOSCOPY;  Surgeon: Gatha Mayer, MD;  Location: [facility];  Service: Endoscopy;  Laterality: N/A;  . DILATATION & CURETTAGE/HYSTEROSCOPY WITH MYOSURE N/A 08/09/2017   Procedure: DILATATION & CURETTAGE/HYSTEROSCOPY WITH MYOSURE;  Surgeon: Christophe Louis, MD;  Location: [facility];  Service: Gynecology;  Laterality: N/A;  Polypectomy  . DILATION AND CURETTAGE OF UTERUS     PMB  . HAND SURGERY Bilateral 1999   Thumbs   . KNEE SURGERY Bilateral 1998   x2  . LYMPH NODE BIOPSY N/A 10/08/2017   Procedure: LYMPH NODE BIOPSY;  Surgeon: [name], MD;  Location: [facility];  Service: Gynecology;  Laterality: N/A;  . MULTIPLE TOOTH EXTRACTIONS    . NASAL SINUS SURGERY  1976  . polio surgery.     right foot - right leg 1.5" shorter than left leg - some wekness  . ROBOTIC ASSISTED TOTAL HYSTERECTOMY WITH BILATERAL SALPINGO OOPHERECTOMY Bilateral 10/08/2017   Procedure: XI ROBOTIC ASSISTED TOTAL HYSTERECTOMY WITH BILATERAL SALPINGO OOPHORECTOMY;  Surgeon: [name], MD;  Location: [facility];  Service: Gynecology;  Laterality: Bilateral;  . THUMB ARTHROSCOPY  1999  . TONSILLECTOMY  2230   72 years old  . TRANSTHORACIC ECHOCARDIOGRAM  07/30/2017   mild LVH, ef 10-25%, grade 2 diastolic dysfunction/ moderately thickened and mild calcified AV leaflets with moderate stenosis &  mild AR (valve area 1.15cm^2, mean gradiant 51mHg, peak grandient 466mg)/ mild LAE and RAE/ trivial TR  . TUBAL LIGATION    . UPPER GI ENDOSCOPY     normal  . WISDOM TOOTH EXTRACTION      Past Medical Hx:  Past Medical  History:  Diagnosis Date  . Allergic rhinitis   . Arthritis    knees  . Depression   . Depression with anxiety   . Endometrial cancer [facility])    02/ 2019  Diagnosed with D&C/hysteroscopy with polypectomy  . Fibromyalgia    tx mobic  . Full dentures   . GERD (gastroesophageal reflux disease)   . Gout   . Hearing loss    wears bilateral hearing aids  . History of colon polyps   . History of nonmelanoma skin cancer    right lower leg  . Hypertension   . IBS (irritable bowel syndrome)    with constipation  . Moderate aortic stenosis by prior echocardiogram 07/30/2017---  cardiologist-- dr harding   Previously diagnosed with murmur back in 2013. echo 07-30-2017 : Moderate AS with mild AR,  valve area 1.15cm^2,  Mean gradient 23 mmHg., peak gradient 42mHg  . Osteopenia   . Polio age 73 months  right leg weakness and 1.5 inches shorter than left leg.   . Urgency of urination   . Wears hearing aid in both ears     Past Gynecological History:  Endometrial cancer. No LMP recorded. Patient is postmenopausal.  Family Hx:  Family History  Problem Relation Age of Onset  . Kidney disease Mother   . Alzheimer's disease Mother   . Stroke Father 741 . Colon cancer Father 714 . Kidney disease Sister   . Heart attack Sister   . Cancer Paternal Aunt        COLON CA  . Colon cancer Paternal Aunt 770   Review of Systems:  Constitutional  Feels well,    ENT Normal appearing ears and nares bilaterally Skin/Breast  No rash, sores, jaundice, itching, dryness Cardiovascular  No chest pain, shortness of breath, or edema  Pulmonary  No cough or wheeze.  Gastro Intestinal  No nausea, vomitting, or diarrhoea. No bright red blood per rectum, no abdominal pain,  change in bowel movement, or constipation.  Genito Urinary  No frequency, urgency, dysuria, + postmenopausal bleeding. Musculo Skeletal  No myalgia, arthralgia, joint swelling or pain  Neurologic  No weakness, numbness, change in gait,  Psychology  No depression, anxiety, insomnia.   Vitals:  Blood pressure (!) 152/63, pulse 73, temperature 98 F (36.7 C), temperature source Oral, resp. rate 16, height 5' 5"  (1.651 m), weight 188 lb 6.4 oz (85.5 kg), SpO2 98 %.  Physical Exam: WD in NAD Neck  Supple NROM, without any enlargements.  Lymph Node Survey No cervical supraclavicular or inguinal adenopathy Cardiovascular  Pulse normal rate, regularity and rhythm.  Systolic murmur.  Lungs  Clear to auscultation bilateraly, without wheezes/crackles/rhonchi. Good air movement.  Skin  No rash/lesions/breakdown  Psychiatry  Alert and oriented to person, place, and time  Abdomen  Normoactive bowel sounds, abdomen soft, non-tender and obese without evidence of hernia. Well healed incisions.  Back No CVA tenderness Genito Urinary  External genitalia normal. Vaginal cuff healing normally with no blood or lesions.  Rectal  deferred Extremities  No bilateral cyanosis, clubbing or edema.   20 minutes of direct face to face counseling time was spent with the patient. This included discussion about prognosis, therapy recommendations and postoperative side effects and are beyond the scope of routine postoperative care.   EThereasa Solo MD  10/29/2017, 3:10 PM

## 2017-10-31 ENCOUNTER — Ambulatory Visit
Admission: RE | Admit: 2017-10-31 | Discharge: 2017-10-31 | Disposition: A | Discharge status: Home / Self Care | Payer: PPO | Source: Ambulatory Visit | Attending: Radiation Oncology | Admitting: Radiation Oncology

## 2017-10-31 ENCOUNTER — Other Ambulatory Visit: Payer: Self-pay

## 2017-10-31 VITALS — BP 134/58 | HR 78 | Temp 98.2°F | Resp 18 | Wt 188.5 lb

## 2017-10-31 DIAGNOSIS — C541 Malignant neoplasm of endometrium: Secondary | ICD-10-CM

## 2017-10-31 DIAGNOSIS — Z90711 Acquired absence of uterus with remaining cervical stump: Secondary | ICD-10-CM | POA: Diagnosis not present

## 2017-10-31 HISTORY — DX: Unspecified malignant neoplasm of skin, unspecified: C44.90

## 2017-10-31 NOTE — Progress Notes (Signed)
Radiation Oncology         [phone]) [phone] ________________________________  Initial Outpatient Consultation  Name: Veronica Greene MRN: [id number]  Date: 10/31/2017  DOB: 11-May-1946  CC:Tower, [name], MD  [name], MD   REFERRING PHYSICIAN: [name], MD  DIAGNOSIS: stage IB grade 1 endometrial cancer   HPI: Veronica Greene is a 72 year old parous woman who is seen in consultation at the request of Dr Denman George for grade 1 endometrial cancer.  She first began vaginal spotting in July 2017.  She was seen for an annual visit with Dr. Landry Mellow in October 2018 and reported this symptom.  A transvaginal ultrasound scan was performed on June 25, 2017 and revealed a normal-sized uterus measuring 7 x 4 x 2.7 cm with normal ovaries bilaterally.  The endometrial thickness was 1.28 cm.  On saline infusion the endometrial hyperechoic mass measured 7 mm. On August 09, 2017 she underwent a hysteroscopy D&C. Final pathology confirmed a FIGO grade 1 endometrioid endometrial adenocarcinoma.  On 10/08/17 she underwent robotic assisted total hysterectomy, BSO and SLN biopsy (Dr. Denman George)  Final pathology revealed a 3.2cm grade 1 endometrioid endometrial adenocarcinoma with outer half myometrial invasion (1 of 1.2cm) and no LVSI. All SLN's were negative as were adnexa and cervix. MSI pending. Given the pathologic findings patient is now referred to radiation oncology to be considered for vaginal brachytherapy.    PREVIOUS RADIATION THERAPY: No  PAST MEDICAL HISTORY:  has a past medical history of Allergic rhinitis, Arthritis, Depression, Depression with anxiety, Endometrial cancer ([place]), Fibromyalgia, Full dentures, GERD (gastroesophageal reflux disease), Gout, Hearing loss, History of colon polyps, History of nonmelanoma skin cancer, Hypertension, IBS (irritable bowel syndrome), Moderate aortic stenosis by prior echocardiogram (07/30/2017---  cardiologist-- dr Ellyn Hack), Osteopenia, Polio (age 92 months), Skin cancer  (2019), Urgency of urination, and Wears hearing aid in both ears.    PAST SURGICAL HISTORY: Past Surgical History:  Procedure Laterality Date  . ANKLE SURGERY Right 1956  . CARPAL TUNNEL RELEASE Bilateral 1986, 1993  . COLONOSCOPY N/A 06/24/2015   Procedure: COLONOSCOPY;  Surgeon: Gatha Mayer, MD;  Location: [facility];  Service: Endoscopy;  Laterality: N/A;  . DILATATION & CURETTAGE/HYSTEROSCOPY WITH MYOSURE N/A 08/09/2017   Procedure: DILATATION & CURETTAGE/HYSTEROSCOPY WITH MYOSURE;  Surgeon: Christophe Louis, MD;  Location: [facility];  Service: Gynecology;  Laterality: N/A;  Polypectomy  . DILATION AND CURETTAGE OF UTERUS     PMB  . HAND SURGERY Bilateral 1999   Thumbs   . KNEE SURGERY Bilateral 1998   x2  . LYMPH NODE BIOPSY N/A 10/08/2017   Procedure: LYMPH NODE BIOPSY;  Surgeon: [name], MD;  Location: [facility];  Service: Gynecology;  Laterality: N/A;  . MULTIPLE TOOTH EXTRACTIONS    . NASAL SINUS SURGERY  1976  . polio surgery.     right foot - right leg 1.5" shorter than left leg - some wekness  . ROBOTIC ASSISTED TOTAL HYSTERECTOMY WITH BILATERAL SALPINGO OOPHERECTOMY Bilateral 10/08/2017   Procedure: XI ROBOTIC ASSISTED TOTAL HYSTERECTOMY WITH BILATERAL SALPINGO OOPHORECTOMY;  Surgeon: [name], MD;  Location: [facility];  Service: Gynecology;  Laterality: Bilateral;  . THUMB ARTHROSCOPY  1999  . TONSILLECTOMY  2758   72 years old  . TRANSTHORACIC ECHOCARDIOGRAM  07/30/2017   mild LVH, ef 82-64%, grade 2 diastolic dysfunction/ moderately thickened and mild calcified AV leaflets with moderate stenosis & mild AR (valve area 1.15cm^2, mean gradiant 56mHg, peak grandient 455mg)/ mild LAE and RAE/ trivial TR  .  TUBAL LIGATION    . UPPER GI ENDOSCOPY     normal  . WISDOM TOOTH EXTRACTION      FAMILY HISTORY: family history includes Alzheimer's disease in her mother; Cancer in her paternal aunt; Colon cancer (age of onset: 80) in her father and paternal aunt; Heart attack in her  sister; Kidney disease in her mother and sister; Stroke (age of onset: 27) in her father.  SOCIAL HISTORY:  reports that she has never smoked. She has never used smokeless tobacco. She reports that she does not drink alcohol or use drugs.  ALLERGIES: Clarithromycin; Codeine; Paroxetine; and Penicillins  MEDICATIONS:  Current Outpatient Medications  Medication Sig Dispense Refill  . acetaminophen (TYLENOL 8 HOUR ARTHRITIS PAIN) 650 MG CR tablet Take 650 mg by mouth daily as needed for pain.     Marland Kitchen acetic acid-hydrocortisone (ACETASOL HC) OTIC solution Place 2 drops into both ears 2 (two) times daily as needed (itching).    . Ascorbic Acid (VITAMIN C) 1000 MG tablet Take 1,000 mg by mouth daily with breakfast.     . aspirin 81 MG EC tablet Take 1 tablet (81 mg total) by mouth daily. DO NOT TAKE AGAIN UNTIL 07/11/2015 (Patient taking differently: Take 81 mg by mouth daily. ) 30 tablet 12  . Calcium-Vitamin D-Vitamin K (VIACTIV) [id number] MG-UNT-MCG CHEW Chew 1 tablet by mouth 2 (two) times daily. Morning & afternoon    . cetirizine (ZYRTEC) 10 MG tablet Take 5 mg by mouth at bedtime.     . cholecalciferol (VITAMIN D) 1000 UNITS tablet Take 1,000 Units by mouth daily with breakfast.     . esomeprazole (NEXIUM) 20 MG capsule Take 40 mg by mouth daily before breakfast.    . fluorouracil (EFUDEX) 5 % cream Apply topically 2 (two) times daily.    . fluticasone (FLONASE) 50 MCG/ACT nasal spray Place 2 sprays into both nostrils at bedtime.     . hydrochlorothiazide (HYDRODIURIL) 25 MG tablet Take 1 tablet (25 mg total) by mouth daily. (Patient taking differently: Take 25 mg by mouth daily with lunch. ) 90 tablet 3  . lubiprostone (AMITIZA) 24 MCG capsule TAKE 1 CAPSULE BY MOUTH TWICE A DAY. (Patient taking differently: Take 24 mcg by mouth 2 (two) times daily. LUNCH & SUPPER) 180 capsule 3  . meloxicam (MOBIC) 7.5 MG tablet Take 1 tablet (7.5 mg total) by mouth daily after breakfast. (Patient taking  differently: Take 7.5 mg by mouth at bedtime. ) 90 tablet 3  . Multiple Vitamin (MULTIVITAMIN WITH MINERALS) TABS tablet Take 1 tablet by mouth daily. WOMEN'S MULTIVITAMIN    . Polyethyl Glycol-Propyl Glycol (SYSTANE) 0.4-0.3 % SOLN Place 1 drop into both eyes 2 (two) times daily.    . potassium chloride (K-DUR) 10 MEQ tablet Take 2 tablets (20 mEq total) by mouth 2 (two) times daily. (Patient taking differently: Take 10 mEq by mouth 4 (four) times daily. ) 360 tablet 3  . sennosides-docusate sodium (SENOKOT-S) 8.6-50 MG tablet Take 1 tablet by mouth daily with supper.     . Simethicone (PHAZYME PO) Take 1 tablet by mouth 4 (four) times daily as needed (for gas (typically one tablet at night)).     . sodium chloride (AYR) 0.65 % nasal spray Place 1 spray into the nose 4 (four) times daily as needed for congestion.    Marland Kitchen venlafaxine XR (EFFEXOR XR) 75 MG 24 hr capsule Take 1 capsule (75 mg total) by mouth daily. (Patient taking differently: Take 75 mg by  mouth daily after breakfast. ) 90 capsule 3   No current facility-administered medications for this encounter.     REVIEW OF SYSTEMS:  REVIEW OF SYSTEMS: A 10+ POINT REVIEW OF SYSTEMS WAS OBTAINED including neurology, dermatology, psychiatry, cardiac, respiratory, lymph, extremities, GI, GU, musculoskeletal, constitutional, reproductive, HEENT. All pertinent positives are noted in the HPI. All others are negative.    PHYSICAL EXAM:  Vitals - 1 value per visit 0/04/9322  SYSTOLIC 557  DIASTOLIC 58  Pulse 78  Temperature 98.2  Respirations 18  Weight (lb) 188.5  Height   BMI 31.37  VISIT REPORT    General: Alert and oriented, in no acute distress HEENT: Head is normocephalic. Extraocular movements are intact. Oropharynx is clear. Neck: Neck is supple, no palpable cervical or supraclavicular lymphadenopathy. Heart: Regular in rate and rhythm with no murmurs, rubs, or gallops. Chest: Clear to auscultation bilaterally, with no rhonchi,  wheezes, or rales. Abdomen: Soft, nontender, nondistended, with no rigidity or guarding. Patient has several small scars from her laparoscopic procedure all healing well without signs of drainage or infection Extremities: No cyanosis or edema. Signs of polio involving the right lower extremity Lymphatics: see Neck Exam Skin: No concerning lesions. Musculoskeletal: symmetric strength and muscle tone throughout. Neurologic: Cranial nerves II through XII are grossly intact. No obvious focalities. Speech is fluent. Coordination is intact. Psychiatric: Judgment and insight are intact. Affect is appropriate. Pelvic exam deferred until simulation and planning day  ECOG = 0  0 - Asymptomatic (Fully active, able to carry on all predisease activities without restriction)  1 - Symptomatic but completely ambulatory (Restricted in physically strenuous activity but ambulatory and able to carry out work of a light or sedentary nature. For example, light housework, office work)  2 - Symptomatic, <50% in bed during the day (Ambulatory and capable of all self care but unable to carry out any work activities. Up and about more than 50% of waking hours)  3 - Symptomatic, >50% in bed, but not bedbound (Capable of only limited self-care, confined to bed or chair 50% or more of waking hours)  4 - Bedbound (Completely disabled. Cannot carry on any self-care. Totally confined to bed or chair)  5 - Death   Eustace Pen MM, Creech RH, Tormey DC, et al. [phone]). "Toxicity and response criteria of the [facility]". [place] Oncol. 5 (6): 649-55  LABORATORY DATA:  Lab Results  Component Value Date   WBC 5.7 10/09/2017   HGB 11.7 (L) 10/09/2017   HCT 34.6 (L) 10/09/2017   MCV 89.9 10/09/2017   PLT 215 10/09/2017   NEUTROABS 3.8 07/31/2017   Lab Results  Component Value Date   NA 140 10/09/2017   K 3.8 10/09/2017   CL 105 10/09/2017   CO2 26 10/09/2017   GLUCOSE 168 (H) 10/09/2017    CREATININE 0.57 10/09/2017   CALCIUM 8.8 (L) 10/09/2017      RADIOGRAPHY: No results found.    IMPRESSION: Stage IB grade 1 endometrioid endometrial adenocarcinoma. The patient was found to have a deeply invasive tumor and would be at risk for vaginal cuff recurrence given pathological findings. I would agree with Dr. Serita Grit recommedation for brachytherapy treatments. I discussed the course of treatment, side effects and potential toxicity of radiation with the patient and her husband. She appears to understand and wishes to proceed with planned course of treatment.  PLAN: The pt will be scheduled for HDR ~ 6 weeks after her surgery. Pt will proceed with  5 brachytherapy treatments directed at the vaginal cuff region with iridium 192 as the high-dose-rate source.     ------------------------------------------------  Blair Promise, PhD, MD  This document serves as a record of services personally performed by Gery Pray, MD. It was created on his behalf by Valeta Harms, a trained medical scribe. The creation of this record is based on the scribe's personal observations and the provider's statements to them. This document has been checked and approved by the attending provider.

## 2017-11-01 ENCOUNTER — Encounter: Payer: Self-pay | Admitting: *Deleted

## 2017-11-01 ENCOUNTER — Telehealth: Payer: Self-pay | Admitting: Oncology

## 2017-11-01 NOTE — Telephone Encounter (Signed)
Called Veronica Greene and advised her that she does not need to have a pap smear now since she has had a hysterectomy unless she has had abnormal pap smears in the past.  She said she has not had any abnormal pap smears and is going to cancel the appointment with her GYN on 04/22/18.  Also discussed that she will have alternating follow up appointments after radiation with Dr. Denman George and Dr. Sondra Come where she will receive pelvic exams.  She verbalized understanding and agreement.

## 2017-11-01 NOTE — Progress Notes (Signed)
[facility] Psychosocial Distress Screening Clinical Social Work  Clinical Social Work was referred by distress screening protocol.  The patient scored a 5 on the Psychosocial Distress Thermometer which indicates moderate distress. Clinical Social Worker contacted patient by phone to assess for distress and other psychosocial needs. Mrs. Frogge shared she had a pleasant appointment with Dr. Sondra Come yesterday.  She reported feeling somewhat nervous about the radiation treatment, but overall is ready to move forward with treatment.  CSW normalized patient's feelings and discussed possible sources of support.  CSW encouraged patient to attend GYN Cancer Support Group where many of the women received the same type of treatment. Patient had questions regarding follow up with her gynecologist for her annual visits- CSW contacted Joylene John, GYN NP, regarding these questions.  Their office will follow up with patient.  ONCBCN DISTRESS SCREENING 10/31/2017  Screening Type Initial Screening  Distress experienced in past week (1-10) 5  Emotional problem type Adjusting to illness;Adjusting to appearance changes  Physical Problem type Constipation/diarrhea;Changes in urination   Kennesha Brewbaker Alver Sorrow, MSW, LCSW, OSW-C Clinical Social Worker [facility] [phone]

## 2017-11-04 ENCOUNTER — Telehealth: Payer: Self-pay | Admitting: *Deleted

## 2017-11-04 NOTE — Telephone Encounter (Signed)
Faxed last office note, op note, path report and Dr.Kinard note to Dr. Sundra Aland office per request

## 2017-11-04 NOTE — Telephone Encounter (Signed)
Called patient to inform of HDR [facility], spoke with patient and she is aware of these appts. 

## 2017-11-07 ENCOUNTER — Ambulatory Visit (INDEPENDENT_AMBULATORY_CARE_PROVIDER_SITE_OTHER): Payer: PPO

## 2017-11-07 VITALS — BP 116/70 | HR 66 | Temp 99.5°F | Ht 65.75 in | Wt 188.5 lb

## 2017-11-07 DIAGNOSIS — E785 Hyperlipidemia, unspecified: Secondary | ICD-10-CM | POA: Diagnosis not present

## 2017-11-07 DIAGNOSIS — I10 Essential (primary) hypertension: Secondary | ICD-10-CM

## 2017-11-07 DIAGNOSIS — Z0000 Encounter for general adult medical examination without abnormal findings: Secondary | ICD-10-CM | POA: Diagnosis not present

## 2017-11-07 LAB — LIPID PANEL
CHOLESTEROL: 170 mg/dL (ref 0–200)
HDL: 54.1 mg/dL (ref >39.00)
LDL CALC: 90 mg/dL (ref 0–99)
NonHDL: 116.26
TRIGLYCERIDES: 129 mg/dL (ref 0.0–149.0)
Total CHOL/HDL Ratio: 3
VLDL: 25.8 mg/dL (ref 0.0–40.0)

## 2017-11-07 LAB — TSH: TSH: 1.55 u[IU]/mL (ref 0.35–4.50)

## 2017-11-07 NOTE — Progress Notes (Signed)
Subjective:   Veronica Greene is a 72 y.o. female who presents for Medicare Annual (Subsequent) preventive examination.  Review of Systems:  N/A Cardiac Risk Factors include: advanced age (>91men, >73 women);obesity (BMI >30kg/m2);dyslipidemia     Objective:     Vitals: BP 116/70 (BP Location: Right Arm, Patient Position: Sitting, Cuff Size: Normal)   Pulse 66   Temp 99.5 F (37.5 C) (Oral)   Ht 5' 5.75" (1.67 m) Comment: shoes  Wt 188 lb 8 oz (85.5 kg)   SpO2 94%   BMI 30.66 kg/m   Body mass index is 30.66 kg/m.  Advanced Directives 11/07/2017 10/31/2017 10/29/2017 10/08/2017 10/02/2017 09/12/2017 07/31/2017  Does Patient Have a Medical Advance Directive? Yes Yes Yes Yes Yes Yes Yes  Type of Paramedic of [place];Living will [place];Living will [place];Living will [place];Living will [place];Living will [place];Living will Living will  Does patient want to make changes to medical advance directive? - No - Patient declined - No - Patient declined - No - Patient declined No - Patient declined  Copy of [place] in Chart? No - copy requested No - copy requested No - copy requested No - copy requested No - copy requested No - copy requested -    Tobacco Social History   Tobacco Use  Smoking Status Never Smoker  Smokeless Tobacco Never Used     Counseling given: No   Clinical Intake:  Pre-visit preparation completed: Yes  Pain : No/denies pain Pain Score: 0-No pain     Nutritional Status: BMI > 30  Obese Nutritional Risks: None Diabetes: No  How often do you need to have someone help you when you read instructions, pamphlets, or other written materials from your doctor or pharmacy?: 1 - Never What is the last grade level you completed in school?: 12th grade  Interpreter Needed?: No  Comments: pt lives with spouse Information  entered by :: LPinson, LPN  Past Medical History:  Diagnosis Date  . Allergic rhinitis   . Arthritis    knees  . Depression   . Depression with anxiety   . Endometrial cancer [facility])    02/ 2019  Diagnosed with D&C/hysteroscopy with polypectomy  . Fibromyalgia    tx mobic  . Full dentures   . GERD (gastroesophageal reflux disease)   . Gout   . Hearing loss    wears bilateral hearing aids  . History of colon polyps   . History of nonmelanoma skin cancer    right lower leg  . Hypertension   . IBS (irritable bowel syndrome)    with constipation  . Moderate aortic stenosis by prior echocardiogram 07/30/2017---  cardiologist-- dr harding   Previously diagnosed with murmur back in 2013. echo 07-30-2017 : Moderate AS with mild AR,  valve area 1.15cm^2,  Mean gradient 23 mmHg., peak gradient 31mmHg  . Osteopenia   . Polio age 53 months   right leg weakness and 1.5 inches shorter than left leg.   . Skin cancer 2019   Right leg  . Urgency of urination   . Wears hearing aid in both ears    Past Surgical History:  Procedure Laterality Date  . ANKLE SURGERY Right 1956  . CARPAL TUNNEL RELEASE Bilateral 1986, 1993  . COLONOSCOPY N/A 06/24/2015   Procedure: COLONOSCOPY;  Surgeon: Gatha Mayer, MD;  Location: [facility];  Service: Endoscopy;  Laterality:  N/A;  . DILATATION & CURETTAGE/HYSTEROSCOPY WITH MYOSURE N/A 08/09/2017   Procedure: DILATATION & CURETTAGE/HYSTEROSCOPY WITH MYOSURE;  Surgeon: Christophe Louis, MD;  Location: [facility];  Service: Gynecology;  Laterality: N/A;  Polypectomy  . DILATION AND CURETTAGE OF UTERUS     PMB  . HAND SURGERY Bilateral 1999   Thumbs   . KNEE SURGERY Bilateral 1998   x2  . LYMPH NODE BIOPSY N/A 10/08/2017   Procedure: LYMPH NODE BIOPSY;  Surgeon: [name], MD;  Location: [facility];  Service: Gynecology;  Laterality: N/A;  . MULTIPLE TOOTH EXTRACTIONS    . NASAL SINUS SURGERY  1976  . polio surgery.     right foot - right leg 1.5" shorter than left  leg - some wekness  . ROBOTIC ASSISTED TOTAL HYSTERECTOMY WITH BILATERAL SALPINGO OOPHERECTOMY Bilateral 10/08/2017   Procedure: XI ROBOTIC ASSISTED TOTAL HYSTERECTOMY WITH BILATERAL SALPINGO OOPHORECTOMY;  Surgeon: [name], MD;  Location: [facility];  Service: Gynecology;  Laterality: Bilateral;  . THUMB ARTHROSCOPY  1999  . TONSILLECTOMY  1940   72 years old  . TRANSTHORACIC ECHOCARDIOGRAM  07/30/2017   mild LVH, ef 43-32%, grade 2 diastolic dysfunction/ moderately thickened and mild calcified AV leaflets with moderate stenosis & mild AR (valve area 1.15cm^2, mean gradiant 80mmHg, peak grandient 55mmHg)/ mild LAE and RAE/ trivial TR  . TUBAL LIGATION    . UPPER GI ENDOSCOPY     normal  . WISDOM TOOTH EXTRACTION     Family History  Problem Relation Age of Onset  . Kidney disease Mother   . Alzheimer's disease Mother   . Stroke Father 94  . Colon cancer Father 32  . Kidney disease Sister   . Heart attack Sister   . Cancer Paternal Aunt        COLON CA  . Colon cancer Paternal Aunt 57   Social History   Socioeconomic History  . Marital status: Married    Spouse name: Not on file  . Number of children: 2  . Years of education: Not on file  . Highest education level: High school graduate  Occupational History  . 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 Financial resource strain: Not on file  . Food insecurity:    Worry: Not on file    Inability: Not on file  . Transportation needs:    Medical: Not on file    Non-medical: Not on file  Tobacco Use  . Smoking status: Never Smoker  . Smokeless tobacco: Never Used  Substance and Sexual Activity  . Alcohol use: No    Alcohol/week: 0.0 oz  . Drug use: No  . Sexual activity: Yes    Birth control/protection: Post-menopausal  Lifestyle  . Physical activity:    Days per week: Not on file    Minutes per session: Not on file  . Stress: Not on file  Relationships   . Social connections:    Talks on phone: Not on file    Gets together: Not on file    Attends religious service: Not on file    Active member of club or organization: Not on file    Attends meetings of clubs or organizations: Not on file    Relationship status: Not on file  Other Topics Concern  . Not on file  Social History Narrative   She is relatively recently remarried.  She has 2  children and 3 grandchildren.  She is currently retired.  But enjoys cleaning house and doing chores.  She likes to do yard work.  Does not routinely exercise.    Outpatient Encounter Medications as of 11/07/2017  Medication Sig  . acetaminophen (TYLENOL 8 HOUR ARTHRITIS PAIN) 650 MG CR tablet Take 650 mg by mouth daily as needed for pain.   Marland Kitchen acetic acid-hydrocortisone (ACETASOL HC) OTIC solution Place 2 drops into both ears 2 (two) times daily as needed (itching).  . Ascorbic Acid (VITAMIN C) 1000 MG tablet Take 1,000 mg by mouth daily with breakfast.   . aspirin 81 MG EC tablet Take 1 tablet (81 mg total) by mouth daily. DO NOT TAKE AGAIN UNTIL 07/11/2015 (Patient taking differently: Take 81 mg by mouth daily. )  . Calcium-Vitamin D-Vitamin K (VIACTIV) 591-638-46 MG-UNT-MCG CHEW Chew 1 tablet by mouth 2 (two) times daily. Morning & afternoon  . cetirizine (ZYRTEC) 10 MG tablet Take 5 mg by mouth at bedtime.   . cholecalciferol (VITAMIN D) 1000 UNITS tablet Take 1,000 Units by mouth daily with breakfast.   . esomeprazole (NEXIUM) 20 MG capsule Take 40 mg by mouth daily before breakfast.  . fluorouracil (EFUDEX) 5 % cream Apply topically 2 (two) times daily.  . fluticasone (FLONASE) 50 MCG/ACT nasal spray Place 2 sprays into both nostrils at bedtime.   . hydrochlorothiazide (HYDRODIURIL) 25 MG tablet Take 1 tablet (25 mg total) by mouth daily. (Patient taking differently: Take 25 mg by mouth daily with lunch. )  . lubiprostone (AMITIZA) 24 MCG capsule TAKE 1 CAPSULE BY MOUTH TWICE A DAY. (Patient taking  differently: Take 24 mcg by mouth 2 (two) times daily. LUNCH & SUPPER)  . meloxicam (MOBIC) 7.5 MG tablet Take 1 tablet (7.5 mg total) by mouth daily after breakfast. (Patient taking differently: Take 7.5 mg by mouth at bedtime. )  . Multiple Vitamin (MULTIVITAMIN WITH MINERALS) TABS tablet Take 1 tablet by mouth daily. WOMEN'S MULTIVITAMIN  . Polyethyl Glycol-Propyl Glycol (SYSTANE) 0.4-0.3 % SOLN Place 1 drop into both eyes 2 (two) times daily.  . potassium chloride (K-DUR) 10 MEQ tablet Take 2 tablets (20 mEq total) by mouth 2 (two) times daily. (Patient taking differently: Take 10 mEq by mouth 4 (four) times daily. )  . sennosides-docusate sodium (SENOKOT-S) 8.6-50 MG tablet Take 1 tablet by mouth daily with supper.   . Simethicone (PHAZYME PO) Take 1 tablet by mouth 4 (four) times daily as needed (for gas (typically one tablet at night)).   . sodium chloride (AYR) 0.65 % nasal spray Place 1 spray into the nose 4 (four) times daily as needed for congestion.  Marland Kitchen venlafaxine XR (EFFEXOR XR) 75 MG 24 hr capsule Take 1 capsule (75 mg total) by mouth daily. (Patient taking differently: Take 75 mg by mouth daily after breakfast. )   No facility-administered encounter medications on file as of 11/07/2017.     Activities of Daily Living In your present state of health, do you have any difficulty performing the following activities: 11/07/2017 10/08/2017  Hearing? Y Y  Comment - -  Vision? N N  Difficulty concentrating or making decisions? N N  Walking or climbing stairs? N N  Dressing or bathing? N N  Doing errands, shopping? N N  Preparing Food and eating ? N -  Using the Toilet? N -  In the past six months, have you accidently leaked urine? N -  Do you have problems with loss of bowel control? N -  Managing your Medications? N -  Managing your Finances? N -  Housekeeping or managing your Housekeeping? N -  Some recent data might be hidden    Patient Care Team: Tower, [name], MD as PCP -  General Regal, Tamala Fothergill, DPM as Consulting Physician (Podiatry) Ninetta Lights, MD as Consulting Physician (Orthopedic Surgery) Jerrell Belfast, MD as Consulting Physician (Otolaryngology) Lavella Hammock, DDS as Consulting Physician (Dentistry) Macarthur Critchley, OD as Referring Physician (Optometry)    Assessment:   This is a routine wellness examination for Veronica Greene.  Hearing Screening Comments: Bilateral hearing aids Vision Screening Comments: Last vision exam in December 2018 with Dr. Macarthur Critchley   Exercise Activities and Dietary recommendations Current Exercise Habits: The patient does not participate in regular exercise at present, Exercise limited by: None identified  Goals    . DIET - INCREASE WATER INTAKE     Starting 11/07/2017, I will continue to drink 3-4 12 oz bottles of water daily.        Fall Risk Fall Risk  11/07/2017 10/31/2017 11/05/2016 11/02/2016 11/01/2015  Falls in the past year? Yes Yes Yes Yes Yes  Comment fell in gym and fell at camping grounds; minor injuries - - - -  Number falls in past yr: 2 or more 2 or more 2 or more 2 or more 2 or more  Comment - - - pt reports 10-12 falls as result of tripping; denies injury -  Injury with Fall? Yes No No No No  Risk Factor Category  High Fall Risk High Fall Risk - - High Fall Risk  Risk for fall due to : Impaired balance/gait;History of fall(s) History of fall(s);Impaired balance/gait - - -  Follow up - Falls prevention discussed;Education provided - - Falls evaluation completed;Education provided   Depression Screen PHQ 2/9 Scores 11/07/2017 10/31/2017 11/05/2016 11/02/2016  PHQ - 2 Score 4 1 2  0  PHQ- 9 Score 10 - 5 -     Cognitive Function MMSE - Mini Mental State Exam 11/07/2017 11/02/2016 11/01/2015  Orientation to time 5 5 5   Orientation to Place 5 5 5   Registration 3 3 3   Attention/ Calculation 0 0 0  Recall 3 3 3   Language- name 2 objects 0 0 0  Language- repeat 1 1 1   Language- follow 3 step command 3 3 3   Language-  read & follow direction 0 0 0  Write a sentence 0 0 0  Copy design 0 0 0  Total score 20 20 20        PLEASE NOTE: A Mini-Cog screen was completed. Maximum score is 20. A value of 0 denotes this part of [place] MMSE was not completed or the patient failed this part of the Mini-Cog screening.   Mini-Cog Screening Orientation to Time - Max 5 pts Orientation to Place - Max 5 pts Registration - Max 3 pts Recall - Max 3 pts Language Repeat - Max 1 pts Language Follow 3 Step Command - Max 3 pts   Immunization History  Administered Date(s) Administered  . Influenza Whole 04/08/2008, 06/30/2009  . Influenza, High Dose Seasonal PF 05/17/2015, 05/07/2016, 05/07/2017  . Influenza-Unspecified 05/09/2014  . Pneumococcal Conjugate-13 10/12/2014  . Pneumococcal Polysaccharide-23 08/25/2010  . Td 10/15/2003  . Zoster 08/12/2008  Screening Tests Health Maintenance  Topic Date Due  . COLONOSCOPY  07/08/2018 (Originally 06/23/2017)  . TETANUS/TDAP  11/08/2018 (Originally 10/14/2013)  . INFLUENZA VACCINE  02/06/2018  . MAMMOGRAM  05/06/2018  . DEXA SCAN  Completed  . Hepatitis C Screening  Completed  .  PNA vac Low Risk Adult  Completed      Plan:     I have personally reviewed, addressed, and noted the following in the patient's chart:  A. Medical and social history B. Use of alcohol, tobacco or illicit drugs  C. Current medications and supplements D. Functional ability and status E.  Nutritional status F.  Physical activity G. Advance directives H. List of other physicians I.  Hospitalizations, surgeries, and ER visits in previous 12 months J.  [place] to include hearing, vision, cognitive, depression L. Referrals and appointments - none  In addition, I have reviewed and discussed with patient certain preventive protocols, quality metrics, and best practice recommendations. A written personalized care plan for preventive services as well as general preventive health  recommendations were provided to patient.  See attached scanned questionnaire for additional information.   Signed,   Lindell Noe, MHA, BS, LPN Health Coach

## 2017-11-07 NOTE — Patient Instructions (Signed)
Veronica Greene , Thank you for taking time to come for your Medicare Wellness Visit. I appreciate your ongoing commitment to your health goals. Please review the following plan we discussed and let me know if I can assist you in the future.   These are the goals we discussed: Goals    . DIET - INCREASE WATER INTAKE     Starting 11/07/2017, I will continue to drink 3-4 12 oz bottles of water daily.        This is a list of the screening recommended for you and due dates:  Health Maintenance  Topic Date Due  . Colon Cancer Screening  07/08/2018*  . Tetanus Vaccine  11/08/2018*  . Flu Shot  02/06/2018  . Mammogram  05/06/2018  . DEXA scan (bone density measurement)  Completed  .  Hepatitis C: One time screening is recommended by Center for Disease Control  (CDC) for  adults born from [birth year] through 1966.   Completed  . Pneumonia vaccines  Completed  *Topic was postponed. The date shown is not the original due date.   Preventive Care for Adults  A healthy lifestyle and preventive care can promote health and wellness. Preventive health guidelines for adults include the following key practices.  . A routine yearly physical is a good way to check with your health care provider about your health and preventive screening. It is a chance to share any concerns and updates on your health and to receive a thorough exam.  . Visit your dentist for a routine exam and preventive care every 6 months. Brush your teeth twice a day and floss once a day. Good oral hygiene prevents tooth decay and gum disease.  . The frequency of eye exams is based on your age, health, family medical history, use  of contact lenses, and other factors. Follow your health care provider's recommendations for frequency of eye exams.  . Eat a healthy diet. Foods like vegetables, fruits, whole grains, low-fat dairy products, and lean protein foods contain the nutrients you need without too many calories. Decrease your intake of foods  high in solid fats, added sugars, and salt. Eat the right amount of calories for you. Get information about a proper diet from your health care provider, if necessary.  . Regular physical exercise is one of the most important things you can do for your health. Most adults should get at least 150 minutes of moderate-intensity exercise (any activity that increases your heart rate and causes you to sweat) each week. In addition, most adults need muscle-strengthening exercises on 2 or more days a week.  Silver Sneakers may be a benefit available to you. To determine eligibility, you may visit the website: www.silversneakers.com or contact program at 780-611-7965 Mon-Fri between 8AM-8PM.   . Maintain a healthy weight. The body mass index (BMI) is a screening tool to identify possible weight problems. It provides an estimate of body fat based on height and weight. Your health care provider can find your BMI and can help you achieve or maintain a healthy weight.   For adults 20 years and older: ? A BMI below 18.5 is considered underweight. ? A BMI of 18.5 to 24.9 is normal. ? A BMI of 25 to 29.9 is considered overweight. ? A BMI of 30 and above is considered obese.   . Maintain normal blood lipids and cholesterol levels by exercising and minimizing your intake of saturated fat. Eat a balanced diet with plenty of fruit and vegetables. Blood tests  for lipids and cholesterol should begin at age 42 and be repeated every 5 years. If your lipid or cholesterol levels are high, you are over 50, or you are at high risk for heart disease, you may need your cholesterol levels checked more frequently. Ongoing high lipid and cholesterol levels should be treated with medicines if diet and exercise are not working.  . If you smoke, find out from your health care provider how to quit. If you do not use tobacco, please do not start.  . If you choose to drink alcohol, please do not consume more than 2 drinks per day.  One drink is considered to be 12 ounces (355 mL) of beer, 5 ounces (148 mL) of wine, or 1.5 ounces (44 mL) of liquor.  . If you are 21-64 years old, ask your health care provider if you should take aspirin to prevent strokes.  . Use sunscreen. Apply sunscreen liberally and repeatedly throughout the day. You should seek shade when your shadow is shorter than you. Protect yourself by wearing long sleeves, pants, a wide-brimmed hat, and sunglasses year round, whenever you are outdoors.  . Once a month, do a whole body skin exam, using a mirror to look at the skin on your back. Tell your health care provider of new moles, moles that have irregular borders, moles that are larger than a pencil eraser, or moles that have changed in shape or color.

## 2017-11-07 NOTE — Progress Notes (Signed)
PCP notes:   Health maintenance:  Colon cancer screening - PCP please address at next appt Tetanus vaccine - postponed/insurance  Abnormal screenings:   Fall risk - hx of multiple falls Fall Risk  11/07/2017 10/31/2017 11/05/2016 11/02/2016 11/01/2015  Falls in the past year? Yes Yes Yes Yes Yes  Comment fell in gym and fell at camping grounds; minor injuries - - - -  Number falls in past yr: 2 or more 2 or more 2 or more 2 or more 2 or more  Comment - - - pt reports 10-12 falls as result of tripping; denies injury -  Injury with Fall? Yes No No No No  Risk Factor Category  High Fall Risk High Fall Risk - - High Fall Risk  Risk for fall due to : Impaired balance/gait;History of fall(s) History of fall(s);Impaired balance/gait - - -  Follow up - Falls prevention discussed;Education provided - - Falls evaluation completed;Education provided   Depression score: 10 Depression screen [facility] 2/9 11/07/2017 10/31/2017 11/05/2016 11/02/2016 11/01/2015  Decreased Interest 2 0 0 0 0  Down, Depressed, Hopeless 2 1 2  0 0  PHQ - 2 Score 4 1 2  0 0  Altered sleeping 2 - 0 - -  Tired, decreased energy 1 - 2 - -  Change in appetite 1 - 1 - -  Feeling bad or failure about yourself  0 - 0 - -  Trouble concentrating 0 - 0 - -  Moving slowly or fidgety/restless 2 - 0 - -  Suicidal thoughts 0 - 0 - -  PHQ-9 Score 10 - 5 - -  Difficult doing work/chores Somewhat difficult - Not difficult at all - -   Patient concerns:   None  Nurse concerns:  None  Next PCP appt:   11/11/17 @ 0930  I reviewed health advisor's note, was available for consultation, and agree with documentation and plan. Loura Pardon MD

## 2017-11-08 ENCOUNTER — Ambulatory Visit: Payer: PPO | Admitting: Gynecologic Oncology

## 2017-11-11 ENCOUNTER — Ambulatory Visit (INDEPENDENT_AMBULATORY_CARE_PROVIDER_SITE_OTHER): Payer: PPO | Admitting: Family Medicine

## 2017-11-11 ENCOUNTER — Encounter: Payer: Self-pay | Admitting: Family Medicine

## 2017-11-11 VITALS — BP 126/60 | HR 69 | Temp 97.8°F | Ht 65.75 in | Wt 190.2 lb

## 2017-11-11 DIAGNOSIS — C541 Malignant neoplasm of endometrium: Secondary | ICD-10-CM | POA: Diagnosis not present

## 2017-11-11 DIAGNOSIS — R7309 Other abnormal glucose: Secondary | ICD-10-CM

## 2017-11-11 DIAGNOSIS — Z6830 Body mass index (BMI) 30.0-30.9, adult: Secondary | ICD-10-CM

## 2017-11-11 DIAGNOSIS — M8580 Other specified disorders of bone density and structure, unspecified site: Secondary | ICD-10-CM

## 2017-11-11 DIAGNOSIS — Z0000 Encounter for general adult medical examination without abnormal findings: Secondary | ICD-10-CM | POA: Diagnosis not present

## 2017-11-11 DIAGNOSIS — I10 Essential (primary) hypertension: Secondary | ICD-10-CM

## 2017-11-11 DIAGNOSIS — E785 Hyperlipidemia, unspecified: Secondary | ICD-10-CM

## 2017-11-11 DIAGNOSIS — E6609 Other obesity due to excess calories: Secondary | ICD-10-CM | POA: Diagnosis not present

## 2017-11-11 MED ORDER — POTASSIUM CHLORIDE ER 10 MEQ PO TBCR: 20.0000 meq | EXTENDED_RELEASE_TABLET | Freq: Two times a day (BID) | ORAL | 3 refills | Status: DC

## 2017-11-11 MED ORDER — LUBIPROSTONE 24 MCG PO CAPS: 24.0000 ug | ORAL_CAPSULE | Freq: Two times a day (BID) | ORAL | 3 refills | Status: DC

## 2017-11-11 MED ORDER — MELOXICAM 7.5 MG PO TABS: 7.5000 mg | ORAL_TABLET | Freq: Every day | ORAL | 3 refills | Status: DC

## 2017-11-11 MED ORDER — VENLAFAXINE HCL ER 75 MG PO CP24: 75.0000 mg | ORAL_CAPSULE | Freq: Every day | ORAL | 3 refills | Status: DC

## 2017-11-11 MED ORDER — HYDROCHLOROTHIAZIDE 25 MG PO TABS: 25.0000 mg | ORAL_TABLET | Freq: Every day | ORAL | 3 refills | Status: DC

## 2017-11-11 NOTE — Patient Instructions (Addendum)
Get Align (probiotic) over the counter- take as directed for at least 2 weeks to see if that helps with bowel function  Eat your fiber if it helps also   Let us know when you are cleared to do a colonoscopy - so we can schedule it (or you can) - ask for Dr Carlean Purl as well    To prevent Diabetes Try to get most of your carbohydrates from produce (with the exception of white potatoes)  Eat less bread/pasta/rice/snack foods/cereals/sweets and other items from the middle of the grocery store (processed carbs)   Take care of yourself ! Good luck with the rest of your treatment

## 2017-11-11 NOTE — Assessment & Plan Note (Signed)
disc imp of low glycemic diet and wt loss to prevent DM2  Will watch this and check A1C with next labs

## 2017-11-11 NOTE — Assessment & Plan Note (Signed)
Reassuring dexa 10/18 in the normal range Falls but no fx  Disc need for calcium/ vitamin D/ wt bearing exercise and bone density test every 2 y to monitor Disc safety/ fracture risk in detail

## 2017-11-11 NOTE — Progress Notes (Signed)
Subjective:    Patient ID: Veronica Greene, female    DOB: [birth year], 72 y.o.   MRN: [id number]  HPI Here for health maintenance exam and to review chronic medical problems    For endometrial cancer  Radiation starts on 5/15 (only has to do 5 treatments for prev of re occurrence)  Did well with her surgery !  Struggling to get her stomach straight after that  amitiza during the day and senekot at night - some pain with bm and a lot of gas She is figuring out what to eat   Wt Readings from Last 3 Encounters:  11/11/17 190 lb 4 oz (86.3 kg)  11/07/17 188 lb 8 oz (85.5 kg)  10/31/17 188 lb 8 oz (85.5 kg)  stable wt  30.94 kg/m   Had amw on 5/2 Has had falls due to gait issue /leg length discrepancy /polio hx  Depression score 10   Colonoscopy 12/16 with 2 y recall  Father and aunt had colon cancer  Needs to wait until she gets done with radiation   Tetanus shot 4/05- she wants to get it when done with her radiation  Postponed -financial   Mammogram 10/18 -nl  Self breast exam - no breast lumps   dexa 10/18 -bmd in the normal range She takes vitamin D for bone health as well as calcium   zostavax 2/10  bp is up a bit- has not had time to calm down / takes her bp med at lunch time  No cp or palpitations or headaches or edema  No side effects to medicines  BP Readings from Last 3 Encounters:  11/11/17 (!) 146/68  11/07/17 116/70  10/31/17 (!) 134/58   hctz and K    Hyperlipidemia  Lab Results  Component Value Date   CHOL 170 11/07/2017   CHOL 153 11/02/2016   CHOL 181 10/18/2015   Lab Results  Component Value Date   HDL 54.10 11/07/2017   HDL 37.70 (L) 11/02/2016   HDL 54.10 10/18/2015   Lab Results  Component Value Date   LDLCALC 90 11/07/2017   LDLCALC 90 11/02/2016   LDLCALC 91 10/18/2015   Lab Results  Component Value Date   TRIG 129.0 11/07/2017   TRIG 127.0 11/02/2016   TRIG 178.0 (H) 10/18/2015   Lab Results  Component Value Date   CHOLHDL 3 11/07/2017   CHOLHDL 4 11/02/2016   CHOLHDL 3 10/18/2015   No results found for: LDLDIRECT Very good /does watch diet   Glucose 168 random-was eating bad this day  Less sweets now  Lab Results  Component Value Date   CREATININE 0.57 10/09/2017   BUN 8 10/09/2017   NA 140 10/09/2017   K 3.8 10/09/2017   CL 105 10/09/2017   CO2 26 10/09/2017   Lab Results  Component Value Date   ALT 18 10/02/2017   AST 24 10/02/2017   ALKPHOS 58 10/02/2017   BILITOT 0.9 10/02/2017    Lab Results  Component Value Date   WBC 5.7 10/09/2017   HGB 11.7 (L) 10/09/2017   HCT 34.6 (L) 10/09/2017   MCV 89.9 10/09/2017   PLT 215 10/09/2017    Lab Results  Component Value Date   TSH 1.55 11/07/2017    Patient Active Problem List   Diagnosis Date Noted  . Elevated random blood glucose level 11/11/2017  . Endometrial cancer ([place]) 09/12/2017  . Essential hypertension 08/29/2017  . Moderate aortic stenosis by prior echocardiogram 08/27/2017  .  Postmenopausal bleeding 08/09/2017  . Endometrial polyp 08/09/2017  . Preoperative exam for gynecologic surgery 07/23/2017  . Heart murmur 07/23/2017  . Pain of right upper arm 06/04/2017  . Estrogen deficiency 11/05/2016  . Routine general medical examination at a health care facility 10/15/2015  . Encounter for Medicare annual wellness exam 09/30/2013  . Back pain 02/16/2013  . Hyperlipidemia, mild 09/02/2012  . Other screening mammogram 12/12/2011  . Degenerative disk disease 08/31/2011  . Hypokalemia 01/02/2011  . Personal history of colonic polyps 11/03/2010  . Constipation, slow transit 11/03/2010  . Benign neoplasm of colon 11/03/2010  . Melanosis coli 11/03/2010  . Gout 08/25/2010  . DERMATITIS, SEBORRHEIC 08/25/2010  . Osteopenia 09/05/2007  . History of poliomyelitis 08/26/2007  . Obesity 08/26/2007  . ANXIETY 08/26/2007  . DEPRESSION 08/26/2007  . ALLERGIC RHINITIS 08/26/2007  . GERD 08/26/2007  . IBS 08/26/2007  .  OVERACTIVE BLADDER 08/26/2007  . OSTEOARTHRITIS, HANDS, BILATERAL 08/26/2007  . FIBROMYALGIA 08/26/2007  . EDEMA, CHRONIC 08/26/2007  . URINARY INCONTINENCE 08/26/2007  . HEMATURIA, HX OF 08/26/2007   Past Medical History:  Diagnosis Date  . Allergic rhinitis   . Arthritis    knees  . Depression   . Depression with anxiety   . Endometrial cancer [facility])    02/ 2019  Diagnosed with D&C/hysteroscopy with polypectomy  . Fibromyalgia    tx mobic  . Full dentures   . GERD (gastroesophageal reflux disease)   . Gout   . Hearing loss    wears bilateral hearing aids  . History of colon polyps   . History of nonmelanoma skin cancer    right lower leg  . Hypertension   . IBS (irritable bowel syndrome)    with constipation  . Moderate aortic stenosis by prior echocardiogram 07/30/2017---  cardiologist-- dr harding   Previously diagnosed with murmur back in 2013. echo 07-30-2017 : Moderate AS with mild AR,  valve area 1.15cm^2,  Mean gradient 23 mmHg., peak gradient 41mmHg  . Osteopenia   . Polio age 51 months   right leg weakness and 1.5 inches shorter than left leg.   . Skin cancer 2019   Right leg  . Urgency of urination   . Wears hearing aid in both ears    Past Surgical History:  Procedure Laterality Date  . ANKLE SURGERY Right 1956  . CARPAL TUNNEL RELEASE Bilateral 1986, 1993  . COLONOSCOPY N/A 06/24/2015   Procedure: COLONOSCOPY;  Surgeon: Gatha Mayer, MD;  Location: [facility];  Service: Endoscopy;  Laterality: N/A;  . DILATATION & CURETTAGE/HYSTEROSCOPY WITH MYOSURE N/A 08/09/2017   Procedure: DILATATION & CURETTAGE/HYSTEROSCOPY WITH MYOSURE;  Surgeon: Christophe Louis, MD;  Location: [facility];  Service: Gynecology;  Laterality: N/A;  Polypectomy  . DILATION AND CURETTAGE OF UTERUS     PMB  . HAND SURGERY Bilateral 1999   Thumbs   . KNEE SURGERY Bilateral 1998   x2  . LYMPH NODE BIOPSY N/A 10/08/2017   Procedure: LYMPH NODE BIOPSY;  Surgeon: [name], MD;  Location: [facility];  Service: Gynecology;  Laterality: N/A;  . MULTIPLE TOOTH EXTRACTIONS    . NASAL SINUS SURGERY  1976  . polio surgery.     right foot - right leg 1.5" shorter than left leg - some wekness  . ROBOTIC ASSISTED TOTAL HYSTERECTOMY WITH BILATERAL SALPINGO OOPHERECTOMY Bilateral 10/08/2017   Procedure: XI ROBOTIC ASSISTED TOTAL HYSTERECTOMY WITH BILATERAL SALPINGO OOPHORECTOMY;  Surgeon: [name], MD;  Location: [facility];  Service:  Gynecology;  Laterality: Bilateral;  . THUMB ARTHROSCOPY  1999  . TONSILLECTOMY  1021   72 years old  . TRANSTHORACIC ECHOCARDIOGRAM  07/30/2017   mild LVH, ef 08-65%, grade 2 diastolic dysfunction/ moderately thickened and mild calcified AV leaflets with moderate stenosis & mild AR (valve area 1.15cm^2, mean gradiant 31mmHg, peak grandient 41mmHg)/ mild LAE and RAE/ trivial TR  . TUBAL LIGATION    . UPPER GI ENDOSCOPY     normal  . WISDOM TOOTH EXTRACTION     Social History   Tobacco Use  . Smoking status: Never Smoker  . Smokeless tobacco: Never Used  Substance Use Topics  . Alcohol use: No    Alcohol/week: 0.0 oz  . Drug use: No   Family History  Problem Relation Age of Onset  . Kidney disease Mother   . Alzheimer's disease Mother   . Stroke Father 14  . Colon cancer Father 85  . Kidney disease Sister   . Heart attack Sister   . Cancer Paternal Aunt        COLON CA  . Colon cancer Paternal Aunt 1   Allergies  Allergen Reactions  . Clarithromycin Swelling    throat swells  . Codeine Nausea And Vomiting  . Paroxetine Other (See Comments)    Ineffective.  Marland Kitchen Penicillins Hives    welts Has patient had a PCN reaction causing immediate rash, facial/tongue/throat swelling, SOB or lightheadedness with hypotension: No Has patient had a PCN reaction causing severe rash involving mucus membranes or skin necrosis: Unknown Has patient had a PCN reaction that required hospitalization:No Has patient had a PCN reaction occurring within the last 10 years:  No If all of the above answers are "NO", then may proceed with Cephalosporin use.    Current Outpatient Medications on File Prior to Visit  Medication Sig Dispense Refill  . acetaminophen (TYLENOL 8 HOUR ARTHRITIS PAIN) 650 MG CR tablet Take 650 mg by mouth daily as needed for pain.     Marland Kitchen acetic acid-hydrocortisone (ACETASOL HC) OTIC solution Place 2 drops into both ears 2 (two) times daily as needed (itching).    . Ascorbic Acid (VITAMIN C) 1000 MG tablet Take 1,000 mg by mouth daily with breakfast.     . aspirin 81 MG EC tablet Take 1 tablet (81 mg total) by mouth daily. DO NOT TAKE AGAIN UNTIL 07/11/2015 (Patient taking differently: Take 81 mg by mouth daily. ) 30 tablet 12  . Calcium-Vitamin D-Vitamin K (VIACTIV) 784-696-29 MG-UNT-MCG CHEW Chew 1 tablet by mouth 2 (two) times daily. Morning & afternoon    . cetirizine (ZYRTEC) 10 MG tablet Take 5 mg by mouth at bedtime.     . cholecalciferol (VITAMIN D) 1000 UNITS tablet Take 1,000 Units by mouth daily with breakfast.     . esomeprazole (NEXIUM) 20 MG capsule Take 40 mg by mouth daily before breakfast.    . fluorouracil (EFUDEX) 5 % cream Apply topically 2 (two) times daily.    . fluticasone (FLONASE) 50 MCG/ACT nasal spray Place 2 sprays into both nostrils at bedtime.     . Multiple Vitamin (MULTIVITAMIN WITH MINERALS) TABS tablet Take 1 tablet by mouth daily. WOMEN'S MULTIVITAMIN    . Polyethyl Glycol-Propyl Glycol (SYSTANE) 0.4-0.3 % SOLN Place 1 drop into both eyes 2 (two) times daily.    . sennosides-docusate sodium (SENOKOT-S) 8.6-50 MG tablet Take 1 tablet by mouth daily with supper.     . Simethicone (PHAZYME PO) Take 1 tablet by mouth  4 (four) times daily as needed (for gas (typically one tablet at night)).     . sodium chloride (AYR) 0.65 % nasal spray Place 1 spray into the nose 4 (four) times daily as needed for congestion.     No current facility-administered medications on file prior to visit.      Patient Active Problem List    Diagnosis Date Noted  . Elevated random blood glucose level 11/11/2017  . Endometrial cancer ([place]) 09/12/2017  . Essential hypertension 08/29/2017  . Moderate aortic stenosis by prior echocardiogram 08/27/2017  . Postmenopausal bleeding 08/09/2017  . Endometrial polyp 08/09/2017  . Preoperative exam for gynecologic surgery 07/23/2017  . Heart murmur 07/23/2017  . Pain of right upper arm 06/04/2017  . Estrogen deficiency 11/05/2016  . Routine general medical examination at a health care facility 10/15/2015  . Encounter for Medicare annual wellness exam 09/30/2013  . Back pain 02/16/2013  . Hyperlipidemia, mild 09/02/2012  . Other screening mammogram 12/12/2011  . Degenerative disk disease 08/31/2011  . Hypokalemia 01/02/2011  . Personal history of colonic polyps 11/03/2010  . Constipation, slow transit 11/03/2010  . Benign neoplasm of colon 11/03/2010  . Melanosis coli 11/03/2010  . Gout 08/25/2010  . DERMATITIS, SEBORRHEIC 08/25/2010  . Osteopenia 09/05/2007  . History of poliomyelitis 08/26/2007  . Obesity 08/26/2007  . ANXIETY 08/26/2007  . DEPRESSION 08/26/2007  . ALLERGIC RHINITIS 08/26/2007  . GERD 08/26/2007  . IBS 08/26/2007  . OVERACTIVE BLADDER 08/26/2007  . OSTEOARTHRITIS, HANDS, BILATERAL 08/26/2007  . FIBROMYALGIA 08/26/2007  . EDEMA, CHRONIC 08/26/2007  . URINARY INCONTINENCE 08/26/2007  . HEMATURIA, HX OF 08/26/2007   Past Medical History:  Diagnosis Date  . Allergic rhinitis   . Arthritis    knees  . Depression   . Depression with anxiety   . Endometrial cancer [facility])    02/ 2019  Diagnosed with D&C/hysteroscopy with polypectomy  . Fibromyalgia    tx mobic  . Full dentures   . GERD (gastroesophageal reflux disease)   . Gout   . Hearing loss    wears bilateral hearing aids  . History of colon polyps   . History of nonmelanoma skin cancer    right lower leg  . Hypertension   . IBS (irritable bowel syndrome)    with constipation  . Moderate  aortic stenosis by prior echocardiogram 07/30/2017---  cardiologist-- dr harding   Previously diagnosed with murmur back in 2013. echo 07-30-2017 : Moderate AS with mild AR,  valve area 1.15cm^2,  Mean gradient 23 mmHg., peak gradient 32mmHg  . Osteopenia   . Polio age 14 months   right leg weakness and 1.5 inches shorter than left leg.   . Skin cancer 2019   Right leg  . Urgency of urination   . Wears hearing aid in both ears    Past Surgical History:  Procedure Laterality Date  . ANKLE SURGERY Right 1956  . CARPAL TUNNEL RELEASE Bilateral 1986, 1993  . COLONOSCOPY N/A 06/24/2015   Procedure: COLONOSCOPY;  Surgeon: Gatha Mayer, MD;  [facility];  Service: Endoscopy;  Laterality: N/A;  . DILATATION & CURETTAGE/HYSTEROSCOPY WITH MYOSURE N/A 08/09/2017   Procedure: DILATATION & CURETTAGE/HYSTEROSCOPY WITH MYOSURE;  Surgeon: Christophe Louis, MD;  Location: [facility] [facility];  Service: Gynecology;  Laterality: N/A;  Polypectomy  . DILATION AND CURETTAGE OF UTERUS     PMB  . HAND SURGERY Bilateral 1999   Thumbs   . KNEE SURGERY Bilateral 1998   x2  .  LYMPH NODE BIOPSY N/A 10/08/2017   Procedure: LYMPH NODE BIOPSY;  Surgeon: [name], MD;  Location: [facility];  Service: Gynecology;  Laterality: N/A;  . MULTIPLE TOOTH EXTRACTIONS    . NASAL SINUS SURGERY  1976  . polio surgery.     right foot - right leg 1.5" shorter than left leg - some wekness  . ROBOTIC ASSISTED TOTAL HYSTERECTOMY WITH BILATERAL SALPINGO OOPHERECTOMY Bilateral 10/08/2017   Procedure: XI ROBOTIC ASSISTED TOTAL HYSTERECTOMY WITH BILATERAL SALPINGO OOPHORECTOMY;  Surgeon: [name], MD;  Location: [facility];  Service: Gynecology;  Laterality: Bilateral;  . THUMB ARTHROSCOPY  1999  . TONSILLECTOMY  6044   72 years old  . TRANSTHORACIC ECHOCARDIOGRAM  07/30/2017   mild LVH, ef 96-78%, grade 2 diastolic dysfunction/ moderately thickened and mild calcified AV leaflets with moderate stenosis & mild AR (valve area 1.15cm^2, mean  gradiant 79mmHg, peak grandient 33mmHg)/ mild LAE and RAE/ trivial TR  . TUBAL LIGATION    . UPPER GI ENDOSCOPY     normal  . WISDOM TOOTH EXTRACTION     Social History   Tobacco Use  . Smoking status: Never Smoker  . Smokeless tobacco: Never Used  Substance Use Topics  . Alcohol use: No    Alcohol/week: 0.0 oz  . Drug use: No   Family History  Problem Relation Age of Onset  . Kidney disease Mother   . Alzheimer's disease Mother   . Stroke Father 39  . Colon cancer Father 26  . Kidney disease Sister   . Heart attack Sister   . Cancer Paternal Aunt        COLON CA  . Colon cancer Paternal Aunt 70   Allergies  Allergen Reactions  . Clarithromycin Swelling    throat swells  . Codeine Nausea And Vomiting  . Paroxetine Other (See Comments)    Ineffective.  Marland Kitchen Penicillins Hives    welts Has patient had a PCN reaction causing immediate rash, facial/tongue/throat swelling, SOB or lightheadedness with hypotension: No Has patient had a PCN reaction causing severe rash involving mucus membranes or skin necrosis: Unknown Has patient had a PCN reaction that required hospitalization:No Has patient had a PCN reaction occurring within the last 10 years: No If all of the above answers are "NO", then may proceed with Cephalosporin use.    Current Outpatient Medications on File Prior to Visit  Medication Sig Dispense Refill  . acetaminophen (TYLENOL 8 HOUR ARTHRITIS PAIN) 650 MG CR tablet Take 650 mg by mouth daily as needed for pain.     Marland Kitchen acetic acid-hydrocortisone (ACETASOL HC) OTIC solution Place 2 drops into both ears 2 (two) times daily as needed (itching).    . Ascorbic Acid (VITAMIN C) 1000 MG tablet Take 1,000 mg by mouth daily with breakfast.     . aspirin 81 MG EC tablet Take 1 tablet (81 mg total) by mouth daily. DO NOT TAKE AGAIN UNTIL 07/11/2015 (Patient taking differently: Take 81 mg by mouth daily. ) 30 tablet 12  . Calcium-Vitamin D-Vitamin K (VIACTIV) 938-101-75  MG-UNT-MCG CHEW Chew 1 tablet by mouth 2 (two) times daily. Morning & afternoon    . cetirizine (ZYRTEC) 10 MG tablet Take 5 mg by mouth at bedtime.     . cholecalciferol (VITAMIN D) 1000 UNITS tablet Take 1,000 Units by mouth daily with breakfast.     . esomeprazole (NEXIUM) 20 MG capsule Take 40 mg by mouth daily before breakfast.    . fluorouracil (EFUDEX) 5 %  cream Apply topically 2 (two) times daily.    . fluticasone (FLONASE) 50 MCG/ACT nasal spray Place 2 sprays into both nostrils at bedtime.     . hydrochlorothiazide (HYDRODIURIL) 25 MG tablet Take 1 tablet (25 mg total) by mouth daily. (Patient taking differently: Take 25 mg by mouth daily with lunch. ) 90 tablet 3  . lubiprostone (AMITIZA) 24 MCG capsule TAKE 1 CAPSULE BY MOUTH TWICE A DAY. (Patient taking differently: Take 24 mcg by mouth 2 (two) times daily. LUNCH & SUPPER) 180 capsule 3  . meloxicam (MOBIC) 7.5 MG tablet Take 1 tablet (7.5 mg total) by mouth daily after breakfast. (Patient taking differently: Take 7.5 mg by mouth at bedtime. ) 90 tablet 3  . Multiple Vitamin (MULTIVITAMIN WITH MINERALS) TABS tablet Take 1 tablet by mouth daily. WOMEN'S MULTIVITAMIN    . Polyethyl Glycol-Propyl Glycol (SYSTANE) 0.4-0.3 % SOLN Place 1 drop into both eyes 2 (two) times daily.    . potassium chloride (K-DUR) 10 MEQ tablet Take 2 tablets (20 mEq total) by mouth 2 (two) times daily. (Patient taking differently: Take 10 mEq by mouth 4 (four) times daily. ) 360 tablet 3  . sennosides-docusate sodium (SENOKOT-S) 8.6-50 MG tablet Take 1 tablet by mouth daily with supper.     . Simethicone (PHAZYME PO) Take 1 tablet by mouth 4 (four) times daily as needed (for gas (typically one tablet at night)).     . sodium chloride (AYR) 0.65 % nasal spray Place 1 spray into the nose 4 (four) times daily as needed for congestion.    Marland Kitchen venlafaxine XR (EFFEXOR XR) 75 MG 24 hr capsule Take 1 capsule (75 mg total) by mouth daily. (Patient taking differently: Take 75  mg by mouth daily after breakfast. ) 90 capsule 3   No current facility-administered medications on file prior to visit.     Review of Systems  Constitutional: Positive for fatigue. Negative for activity change, appetite change, fever and unexpected weight change.  HENT: Negative for congestion, ear pain, rhinorrhea, sinus pressure and sore throat.   Eyes: Negative for pain, redness and visual disturbance.  Respiratory: Negative for cough, shortness of breath and wheezing.   Cardiovascular: Negative for chest pain and palpitations.  Gastrointestinal: Negative for abdominal pain, blood in stool, constipation and diarrhea.  Endocrine: Negative for polydipsia and polyuria.  Genitourinary: Negative for dysuria, frequency and urgency.  Musculoskeletal: Negative for arthralgias, back pain and myalgias.  Skin: Negative for pallor and rash.  Allergic/Immunologic: Negative for environmental allergies.  Neurological: Negative for dizziness, syncope and headaches.  Hematological: Negative for adenopathy. Does not bruise/bleed easily.  Psychiatric/Behavioral: Negative for decreased concentration and dysphoric mood. The patient is not nervous/anxious.        Objective:   Physical Exam  Constitutional: She appears well-developed and well-nourished. No distress.  obese and well appearing   HENT:  Head: Normocephalic and atraumatic.  Right Ear: External ear normal.  Left Ear: External ear normal.  Mouth/Throat: Oropharynx is clear and moist.  Eyes: Pupils are equal, round, and reactive to light. Conjunctivae and EOM are normal. No scleral icterus.  Neck: Normal range of motion. Neck supple. No JVD present. Carotid bruit is not present. No thyromegaly present.  Cardiovascular: Normal rate, regular rhythm and intact distal pulses. Exam reveals no gallop.  Murmur heard. Pulmonary/Chest: Effort normal and breath sounds normal. No respiratory distress. She has no wheezes. She exhibits no tenderness. No  breast tenderness, discharge or bleeding.  Abdominal: Soft. Bowel sounds are  normal. She exhibits no distension, no abdominal bruit and no mass. There is no tenderness.  Genitourinary:  Genitourinary Comments: Breast exam: No mass, nodules, thickening, tenderness, bulging, retraction, inflamation, nipple discharge or skin changes noted.  No axillary or clavicular LA.      Musculoskeletal: Normal range of motion. She exhibits no edema or tenderness.  Lymphadenopathy:    She has no cervical adenopathy.  Neurological: She is alert. She has normal reflexes. No cranial nerve deficit. She exhibits normal muscle tone. Coordination normal.  Skin: Skin is warm and dry. No rash noted. No erythema. No pallor.  Solar lentigines diffusely sks scattered   Psychiatric: She has a normal mood and affect.          Assessment & Plan:   Problem List Items Addressed This Visit      Cardiovascular and Mediastinum   Essential hypertension (Chronic)    bp in fair control at this time  BP Readings from Last 1 Encounters:  11/11/17 126/60   No changes needed Disc lifstyle change with low sodium diet and exercise  Labs reviewed       Relevant Medications   hydrochlorothiazide (HYDRODIURIL) 25 MG tablet     Musculoskeletal and Integument   Osteopenia    Reassuring dexa 10/18 in the normal range [place] but no fx  Disc need for calcium/ vitamin D/ wt bearing exercise and bone density test every 2 y to monitor Disc safety/ fracture risk in detail          Genitourinary   Endometrial cancer ([place])    Doing well post surgically  Prep for 5 radiation tx  Feeling good          Other   Elevated random blood glucose level    disc imp of low glycemic diet and wt loss to prevent DM2  Will watch this and check A1C with next labs         Hyperlipidemia, mild (Chronic)    Disc goals for lipids and reasons to control them Rev labs with pt Rev low sat fat diet in detail  Good control with diet         Relevant Medications   hydrochlorothiazide (HYDRODIURIL) 25 MG tablet   Obesity    Discussed how this problem influences overall health and the risks it imposes  Reviewed plan for weight loss with lower calorie diet (via better food choices and also portion control or program like weight watchers) and exercise building up to or more than 30 minutes 5 days per week including some aerobic activity         Routine general medical examination at a health care facility - Primary    Reviewed health habits including diet and exercise and skin cancer prevention Reviewed appropriate screening tests for age  Also reviewed health mt list, fam hx and immunization status , as well as social and family history   See HPI Labs rev  amw rev  Due for screening colonoscopy when done with her radiation for endom ca Marcelina Morel tetanus shot Enc good health habits

## 2017-11-11 NOTE — Assessment & Plan Note (Signed)
Reviewed health habits including diet and exercise and skin cancer prevention Reviewed appropriate screening tests for age  Also reviewed health mt list, fam hx and immunization status , as well as social and family history   See HPI Labs rev  amw rev  Due for screening colonoscopy when done with her radiation for endom ca Marcelina Morel tetanus shot Enc good health habits

## 2017-11-11 NOTE — Assessment & Plan Note (Signed)
bp in fair control at this time  BP Readings from Last 1 Encounters:  11/11/17 126/60   No changes needed Disc lifstyle change with low sodium diet and exercise  Labs reviewed

## 2017-11-11 NOTE — Assessment & Plan Note (Signed)
Doing well post surgically  Prep for 5 radiation tx  Feeling good

## 2017-11-11 NOTE — Assessment & Plan Note (Signed)
Discussed how this problem influences overall health and the risks it imposes  Reviewed plan for weight loss with lower calorie diet (via better food choices and also portion control or program like weight watchers) and exercise building up to or more than 30 minutes 5 days per week including some aerobic activity    

## 2017-11-11 NOTE — Assessment & Plan Note (Signed)
Disc goals for lipids and reasons to control them Rev labs with pt Rev low sat fat diet in detail  Good control with diet

## 2017-11-12 ENCOUNTER — Telehealth: Payer: Self-pay | Admitting: Oncology

## 2017-11-12 NOTE — Telephone Encounter (Signed)
Call received from [place], [place] with radiation asking to call patient about concerns about radiation starting next week.  Called patient and discussed brachytherapy treatments and possible side effects.  She verbalized understanding and then said she has noticed a new odor from the vaginal area.  She denies having any fever, itching or vaginal discharge.  Joylene John, NP was notified and said this may be how her body is reacting to the stiches at the vaginal cuff.  She said for patient to call back to be seen if she starts running a fever or has purulent vaginal discharge.  Patient advised of recommendations and she verbalized agreement.

## 2017-11-19 ENCOUNTER — Telehealth: Payer: Self-pay | Admitting: *Deleted

## 2017-11-19 ENCOUNTER — Telehealth: Payer: Self-pay | Admitting: Oncology

## 2017-11-19 NOTE — Telephone Encounter (Signed)
Patient called with questions about tomorrow's appointment for HDR treatment.  She received a message advising her not to wear perfume or lotion.  Explained that this is due to [facility]'s fragrance free policy. Advised her that she can wear her rings.  Also dicussed the CT scan as she was worried about being inclosed.  Advised her that it is more open than an MRI.  She verbalized understanding and agreement.

## 2017-11-19 NOTE — Telephone Encounter (Signed)
Called patient to remind of HDR [facility] for 11-20-17, spoke with patient and she is aware of these appts.

## 2017-11-20 ENCOUNTER — Encounter: Payer: Self-pay | Admitting: Oncology

## 2017-11-20 ENCOUNTER — Encounter: Payer: Self-pay | Admitting: Radiation Oncology

## 2017-11-20 ENCOUNTER — Ambulatory Visit
Admission: RE | Admit: 2017-11-20 | Discharge: 2017-11-20 | Disposition: A | Discharge status: Home / Self Care | Payer: PPO | Source: Ambulatory Visit | Attending: Radiation Oncology | Admitting: Radiation Oncology

## 2017-11-20 ENCOUNTER — Other Ambulatory Visit: Payer: Self-pay

## 2017-11-20 VITALS — BP 156/58 | HR 66 | Temp 98.5°F | Resp 20 | Ht 65.75 in | Wt 189.4 lb

## 2017-11-20 DIAGNOSIS — Z510 Encounter for antineoplastic radiation therapy: Secondary | ICD-10-CM | POA: Insufficient documentation

## 2017-11-20 DIAGNOSIS — N898 Other specified noninflammatory disorders of vagina: Secondary | ICD-10-CM | POA: Diagnosis not present

## 2017-11-20 DIAGNOSIS — C541 Malignant neoplasm of endometrium: Secondary | ICD-10-CM

## 2017-11-20 DIAGNOSIS — Z4689 Encounter for fitting and adjustment of other specified devices: Secondary | ICD-10-CM | POA: Diagnosis not present

## 2017-11-20 NOTE — Progress Notes (Signed)
  Radiation Oncology         [phone]) [phone] ________________________________  Name: Veronica Greene MRN: [id number]  Date: 11/20/2017  DOB: August 02, 1945  SIMULATION AND TREATMENT PLANNING NOTE HDR BRACHYTHERAPY  DIAGNOSIS: Stage IB grade 1 endometrioid endometrial adenocarcinoma.  NARRATIVE:  The patient was brought to the [place].  Identity was confirmed.  All relevant records and images related to the planned course of therapy were reviewed.  The patient freely provided informed written consent to proceed with treatment after reviewing the details related to the planned course of therapy. The consent form was witnessed and verified by the simulation staff.  Then, the patient was set-up in a stable reproducible  supine position for radiation therapy.  CT images were obtained.  Surface markings were placed.  The CT images were loaded into the planning software.  Then the target and avoidance structures were contoured.  Treatment planning then occurred.  The radiation prescription was entered and confirmed.   I have requested : Brachytherapy Isodose Plan and Dosimetry Calculations to plan the radiation distribution.    PLAN:  The patient will receive 30 Gy in 5 fractions. Iridium-192 will be the high-dose rate source. The patient will be treated with a 3.0 cm diameter segmented cylinder with a treatment length of 3.5 cm. prescription will be to the mucosal surface.    ________________________________  Blair Promise, PhD, MD   This document serves as a record of services personally performed by Gery Pray, MD. It was created on his behalf by Bethann Humble, a trained medical scribe. The creation of this record is based on the scribe's personal observations and the provider's statements to them. This document has been checked and approved by the attending provider.

## 2017-11-20 NOTE — Progress Notes (Signed)
  Radiation Oncology         (336) [phone] ________________________________  Name: Veronica Greene MRN: [id number]  Date: 11/20/2017  DOB: 1946-01-08  CC: Tower, [name], MD  [name], MD  HDR BRACHYTHERAPY NOTE  DIAGNOSIS: Stage IB grade 1 endometrioid endometrial adenocarcinoma.   Simple treatment device note: Patient had construction of her custom vaginal cylinder. She will be treated with a 3 cm diameter segmented cylinder. This conforms to her anatomy without undue discomfort.  Vaginal brachytherapy procedure node: The patient was brought to the [place] suite. Identity was confirmed. All relevant records and images related to the planned course of therapy were reviewed. The patient freely provided informed written consent to proceed with treatment after reviewing the details related to the planned course of therapy. The consent form was witnessed and verified by the simulation staff. Then, the patient was set-up in a stable reproducible supine position for radiation therapy. Pelvic exam revealed the vaginal cuff to be intact. The patient's custom vaginal cylinder was placed in the proximal vagina. This was affixed to the CT/MR stabilization plate to prevent slippage. Patient tolerated the placement well.  Verification simulation note:  A fiducial marker was placed within the vaginal cylinder. An AP and lateral film was then obtained through the pelvis area. This documented accurate position of the vaginal cylinder for treatment.  HDR BRACHYTHERAPY TREATMENT  The remote afterloading device was affixed to the vaginal cylinder by catheter. Patient then proceeded to undergo her first high-dose-rate treatment directed at the proximal vagina. The patient was prescribed a dose of 6 gray to be delivered to the mucosal surface. Treatment length was 3.5 cm. Patient was treated with 1 channel using 8 dwell positions. Treatment time was 254.4 seconds. Iridium 192 was the high-dose-rate source for  treatment. The patient tolerated the treatment well. After completion of her therapy, a radiation survey was performed documenting return of the iridium source into the GammaMed safe.   PLAN: The patient will return next week for her second high dose rate treatment. ________________________________  Blair Promise, PhD, MD   This document serves as a record of services personally performed by Gery Pray, MD. It was created on his behalf by Bethann Humble, a trained medical scribe. The creation of this record is based on the scribe's personal observations and the provider's statements to them. This document has been checked and approved by the attending provider.

## 2017-11-20 NOTE — Progress Notes (Signed)
  Oncology Nurse Navigator Documentation  Navigator Location: [facility] (11/20/17 1051) Referral date to RadOnc/MedOnc: 10/16/17 (11/20/17 1051) )Navigator Encounter Type: Treatment (11/20/17 1051)   Abnormal Finding Date: 08/09/17 (11/20/17 1051) Confirmed Diagnosis Date: 10/08/17 (11/20/17 1051) Surgery Date: 10/08/17 (11/20/17 1051)             Patient Visit Type: BFXOVA (11/20/17 1051) Treatment Phase: First Radiation Tx (11/20/17 1051) Barriers/Navigation Needs: Education (11/20/17 1051) Education: Preparing for Upcoming Surgery/ Treatment (11/20/17 1051) Interventions: Education (11/20/17 1051)     Education Method: Verbal (11/20/17 1051)      Acuity: Level 2 (11/20/17 1051)   Acuity Level 2: Ongoing guidance and education throughout treatment as needed (11/20/17 1051)     Time Spent with Patient: 30 (11/20/17 1051)   Met with Veronica Greene during her appointment with Dr. Sondra Come for her first HDR treatment.  Provided support and education about the procedure during her HDR fitting.

## 2017-11-20 NOTE — Progress Notes (Signed)
Bekka Qian presents today with her husband for HDR [facility] fitting and simulation. Reports a new mild vaginal odor, but denies any fever or discharge. Denies any pain or abdominal discomfort. Reports normal bladder and bowel function, with occasional constipation that is manageable. Denies any issues with nausea or vomiting as well as any changes in appetite. She is anxious/tearful about the procedure and moving forward with this aspect of her treatment, but verbalizes that she trusts her treatment team and is willing to do what they advise.  Vitals:   11/20/17 0903  BP: (!) 156/58  Pulse: 66  Resp: 20  Temp: 98.5 F (36.9 C)  SpO2: 98%    Wt Readings from Last 3 Encounters:  11/20/17 189 lb 6.4 oz (85.9 kg)  11/11/17 190 lb 4 oz (86.3 kg)  11/07/17 188 lb 8 oz (85.5 kg)

## 2017-11-20 NOTE — Progress Notes (Signed)
Radiation Oncology         [phone] ________________________________  Name: Veronica Greene MRN: [id number]  Date: 11/20/2017  DOB: [birth year]  Vaginal Brachytherapy Procedure Note  CC: Tower, [name], MD [name], MD    ICD-10-CM   1. Endometrial cancer (HCC) C54.1     Diagnosis: Stage IB grade 1 endometrioid endometrial adenocarcinoma.  Narrative: She returns today for vaginal cylinder fitting. The patient reports new mild vaginal odor, but she denies fever or discharge. She reports occasional constipation that she manages. She denies any pain, abdominal discomfort, bladder issues, nausea/vomiting, or changes in appetite. The patient is anxious and tearful about the procedure but verbalizes that she trusts her treatment team and is willing to do what they advise.   ALLERGIES: is allergic to clarithromycin; codeine; paroxetine; and penicillins.  Meds: Current Outpatient Medications  Medication Sig Dispense Refill  . acetaminophen (TYLENOL 8 HOUR ARTHRITIS PAIN) 650 MG CR tablet Take 650 mg by mouth daily as needed for pain.     Marland Kitchen acetic acid-hydrocortisone (ACETASOL HC) OTIC solution Place 2 drops into both ears 2 (two) times daily as needed (itching).    . Ascorbic Acid (VITAMIN C) 1000 MG tablet Take 1,000 mg by mouth daily with breakfast.     . aspirin 81 MG EC tablet Take 1 tablet (81 mg total) by mouth daily. DO NOT TAKE AGAIN UNTIL 07/11/2015 (Patient taking differently: Take 81 mg by mouth daily. ) 30 tablet 12  . Calcium-Vitamin D-Vitamin K (VIACTIV) 035-009-38 MG-UNT-MCG CHEW Chew 1 tablet by mouth 2 (two) times daily. Morning & afternoon    . cetirizine (ZYRTEC) 10 MG tablet Take 5 mg by mouth at bedtime.     . cholecalciferol (VITAMIN D) 1000 UNITS tablet Take 1,000 Units by mouth daily with breakfast.     . esomeprazole (NEXIUM) 20 MG capsule Take 40 mg by mouth daily before breakfast.    . fluorouracil (EFUDEX) 5 % cream Apply topically 2 (two) times daily.    .  fluticasone (FLONASE) 50 MCG/ACT nasal spray Place 2 sprays into both nostrils at bedtime.     . hydrochlorothiazide (HYDRODIURIL) 25 MG tablet Take 1 tablet (25 mg total) by mouth daily with lunch. 90 tablet 3  . lubiprostone (AMITIZA) 24 MCG capsule Take 1 capsule (24 mcg total) by mouth 2 (two) times daily. LUNCH & SUPPER 180 capsule 3  . meloxicam (MOBIC) 7.5 MG tablet Take 1 tablet (7.5 mg total) by mouth at bedtime. 90 tablet 3  . Multiple Vitamin (MULTIVITAMIN WITH MINERALS) TABS tablet Take 1 tablet by mouth daily. WOMEN'S MULTIVITAMIN    . Polyethyl Glycol-Propyl Glycol (SYSTANE) 0.4-0.3 % SOLN Place 1 drop into both eyes 2 (two) times daily.    . potassium chloride (K-DUR) 10 MEQ tablet Take 2 tablets (20 mEq total) by mouth 2 (two) times daily. 360 tablet 3  . sennosides-docusate sodium (SENOKOT-S) 8.6-50 MG tablet Take 1 tablet by mouth daily with supper.     . Simethicone (PHAZYME PO) Take 1 tablet by mouth 4 (four) times daily as needed (for gas (typically one tablet at night)).     . sodium chloride (AYR) 0.65 % nasal spray Place 1 spray into the nose 4 (four) times daily as needed for congestion.    Marland Kitchen venlafaxine XR (EFFEXOR XR) 75 MG 24 hr capsule Take 1 capsule (75 mg total) by mouth daily after breakfast. 90 capsule 3   No current facility-administered medications for this encounter.  Physical Findings: The patient is in no acute distress. Patient is alert and oriented.  height is 5' 5.75" (1.67 m) and weight is 189 lb 6.4 oz (85.9 kg). Her oral temperature is 98.5 F (36.9 C). Her blood pressure is 156/58 (abnormal) and her pulse is 66. Her respiration is 20 and oxygen saturation is 98%.   No palpable cervical, supraclavicular or axillary lymphoadenopathy. The heart has a regular rate and rhythm. The lungs are clear to auscultation. Abdomen soft and non-tender.  On pelvic examination the external genitalia were unremarkable. A speculum exam was performed. Vaginal cuff  intact, no mucosal lesions. On bimanual exam there were no pelvic masses appreciated.  Lab Findings: Lab Results  Component Value Date   WBC 5.7 10/09/2017   HGB 11.7 (L) 10/09/2017   HCT 34.6 (L) 10/09/2017   MCV 89.9 10/09/2017   PLT 215 10/09/2017    Radiographic Findings: No results found.  Impression: Stage IB grade 1 endometrioid endometrial adenocarcinoma.  Patient was fitted for a vaginal cylinder. The patient will be treated with a 3 cm diameter cylinder with a treatment length of 3.5 cm. This distended the vaginal vault without undue discomfort. The patient tolerated the procedure well.  The patient was successfully fitted for a vaginal cylinder. The patient is appropriate to begin vaginal brachytherapy.   Plan: The patient will proceed with CT simulation and vaginal brachytherapy today.    _______________________________   Blair Promise, PhD, MD  This document serves as a record of services personally performed by Gery Pray, MD. It was created on his behalf by Bethann Humble, a trained medical scribe. The creation of this record is based on the scribe's personal observations and the provider's statements to them. This document has been checked and approved by the attending provider.

## 2017-11-21 ENCOUNTER — Ambulatory Visit: Payer: PPO | Admitting: Radiation Oncology

## 2017-11-22 ENCOUNTER — Ambulatory Visit: Payer: PPO | Admitting: Radiation Oncology

## 2017-11-25 ENCOUNTER — Ambulatory Visit: Payer: PPO | Admitting: Radiation Oncology

## 2017-11-26 ENCOUNTER — Telehealth: Payer: Self-pay | Admitting: *Deleted

## 2017-11-26 ENCOUNTER — Ambulatory Visit: Payer: PPO | Admitting: Radiation Oncology

## 2017-11-26 NOTE — Telephone Encounter (Signed)
Called patient to remind of HDR Tx. for 11-27-17 @ 11 am, spoke with patient and she is aware of this tx.

## 2017-11-27 ENCOUNTER — Encounter: Payer: Self-pay | Admitting: Oncology

## 2017-11-27 ENCOUNTER — Ambulatory Visit
Admission: RE | Admit: 2017-11-27 | Discharge: 2017-11-27 | Disposition: A | Discharge status: Home / Self Care | Payer: PPO | Source: Ambulatory Visit | Attending: Radiation Oncology | Admitting: Radiation Oncology

## 2017-11-27 ENCOUNTER — Telehealth: Payer: Self-pay | Admitting: *Deleted

## 2017-11-27 DIAGNOSIS — C541 Malignant neoplasm of endometrium: Secondary | ICD-10-CM

## 2017-11-27 DIAGNOSIS — Z510 Encounter for antineoplastic radiation therapy: Secondary | ICD-10-CM | POA: Diagnosis not present

## 2017-11-27 NOTE — Progress Notes (Addendum)
  Radiation Oncology         [phone] ________________________________  Name: Veronica Greene MRN: [id number]  Date: 11/27/2017  DOB: 1945-10-16  CC: Tower, [name], MD  [name], MD  HDR BRACHYTHERAPY NOTE  DIAGNOSIS: StageIB grade1endometrioid endometrial adenocarcinoma.    Simple treatment device note: Patient had construction of her custom vaginal cylinder. She will be treated with a 3.0 cm diameter segmented cylinder. This conforms to her anatomy without undue discomfort.  Vaginal brachytherapy procedure node: The patient was brought to the [place] suite. Identity was confirmed. All relevant records and images related to the planned course of therapy were reviewed. The patient freely provided informed written consent to proceed with treatment after reviewing the details related to the planned course of therapy. The consent form was witnessed and verified by the simulation staff. Then, the patient was set-up in a stable reproducible supine position for radiation therapy. Pelvic exam revealed the vaginal cuff to be intact . The patient's custom vaginal cylinder was placed in the proximal vagina. This was affixed to the CT/MR stabilization plate to prevent slippage. Patient tolerated the placement well.  Verification simulation note:  A fiducial marker was placed within the vaginal cylinder. An AP and lateral film was then obtained through the pelvis area. This documented accurate position of the vaginal cylinder for treatment.  HDR BRACHYTHERAPY TREATMENT  The remote afterloading device was affixed to the vaginal cylinder by catheter. Patient then proceeded to undergo her second high-dose-rate treatment directed at the proximal vagina. The patient was prescribed a dose of  6.0 gray to be delivered to the mucosal surface. Treatment length was 3.5 cm. Patient was treated with 1 channel using 8 dwell positions. Treatment time was 271.8 seconds. Iridium 192 was the high-dose-rate source for  treatment. The patient tolerated the treatment well. After completion of her therapy, a radiation survey was performed documenting return of the iridium source into the GammaMed safe.   PLAN: the patient will return next week for her third high-dose-rate treatment. ________________________________  Blair Promise, PhD, MD

## 2017-11-27 NOTE — Telephone Encounter (Signed)
Called patient to inform that HDR Tx. has been moved from 12-11-17 to 12-19-17 @ 9 am, spoke with patient and she is aware of this tx. and she is good with it.

## 2017-11-29 ENCOUNTER — Telehealth: Payer: Self-pay | Admitting: *Deleted

## 2017-11-29 NOTE — Telephone Encounter (Signed)
Returned the patient's call back concerning her medical information for her cancer policy.  Patient is requesting : Biopsy Report, Operative Note, and surgical pathology.  Patient ask that the information be mailed to her mailing address and not her physical address, which is [address] [place], [place]  [address].  Informed patient that it may be a couple of weeks before she receives the information.  Patient verbalized understanding. Also told patient to call back if she did not receive the information.

## 2017-11-29 NOTE — Telephone Encounter (Signed)
Called patient to inform her that the medical information that she requested is being placed in the mail today and that she should receive it within a week.  I told patient to call our office if she doesn't receive the information in a week or so. Patient verbalized understanding.

## 2017-12-04 ENCOUNTER — Telehealth: Payer: Self-pay | Admitting: *Deleted

## 2017-12-04 NOTE — Telephone Encounter (Signed)
CALLED PATIENT TO REMIND OF HDR [place] 12-05-17 @ 2 PM, LVM FOR A RETURN CALL

## 2017-12-05 ENCOUNTER — Telehealth: Payer: Self-pay | Admitting: *Deleted

## 2017-12-05 ENCOUNTER — Ambulatory Visit
Admission: RE | Admit: 2017-12-05 | Discharge: 2017-12-05 | Disposition: A | Discharge status: Home / Self Care | Payer: PPO | Source: Ambulatory Visit | Attending: Radiation Oncology | Admitting: Radiation Oncology

## 2017-12-05 DIAGNOSIS — C541 Malignant neoplasm of endometrium: Secondary | ICD-10-CM

## 2017-12-05 DIAGNOSIS — Z510 Encounter for antineoplastic radiation therapy: Secondary | ICD-10-CM | POA: Diagnosis not present

## 2017-12-05 NOTE — Telephone Encounter (Signed)
Called patient to remind of HDR Tx. for 12-09-17, spoke with patient and she is aware of this tx.

## 2017-12-05 NOTE — Progress Notes (Signed)
  Radiation Oncology         [phone] ________________________________  Name: Veronica Greene MRN: [id number]  Date: 12/05/2017  DOB: [birth year]  CC: Tower, [name], MD  [name], MD  HDR BRACHYTHERAPY NOTE  DIAGNOSIS: 72 y.o. female with StageIB grade1endometrioid endometrial adenocarcinoma   Simple treatment device note: Patient had construction of her custom vaginal cylinder. She will be treated with a 3.0 cm diameter segmented cylinder. This conforms to her anatomy without undue discomfort.  Vaginal brachytherapy procedure node: The patient was brought to the [place] suite. Identity was confirmed. All relevant records and images related to the planned course of therapy were reviewed. The patient freely provided informed written consent to proceed with treatment after reviewing the details related to the planned course of therapy. The consent form was witnessed and verified by the simulation staff. Then, the patient was set-up in a stable reproducible supine position for radiation therapy. Pelvic exam revealed the vaginal cuff to be intact. The patient's custom vaginal cylinder was placed in the proximal vagina. This was affixed to the CT/MR stabilization plate to prevent slippage. Patient tolerated the placement well.  Verification simulation note:  A fiducial marker was placed within the vaginal cylinder. An AP and lateral film was then obtained through the pelvis area. This documented accurate position of the vaginal cylinder for treatment.  HDR BRACHYTHERAPY TREATMENT  The remote afterloading device was affixed to the vaginal cylinder by catheter. Patient then proceeded to undergo her third high-dose-rate treatment directed at the proximal vagina. The patient was prescribed a dose of 6.0 gray to be delivered to the mucosal surface. Treatment length was 3.5 cm. Patient was treated with 1 channel using 8 dwell positions. Treatment time was 293.0 seconds. Iridium 192 was the  high-dose-rate source for treatment. The patient tolerated the treatment well. After completion of her therapy, a radiation survey was performed documenting return of the iridium source into the GammaMed safe. Patient did report some abdominal cramping and mild diarrhea but is unsure if it was related to her radiation treatment   PLAN: The patient will return on 12/09/17 for her fourth HDR treatment. ________________________________   Blair Promise, PhD, MD  This document serves as a record of services personally performed by Gery Pray, MD. It was created on his behalf by Rae Lips, a trained medical scribe. The creation of this record is based on the scribe's personal observations and the provider's statements to them. This document has been checked and approved by the attending provider.

## 2017-12-09 ENCOUNTER — Ambulatory Visit
Admission: RE | Admit: 2017-12-09 | Discharge: 2017-12-09 | Disposition: A | Discharge status: Home / Self Care | Payer: PPO | Source: Ambulatory Visit | Attending: Radiation Oncology | Admitting: Radiation Oncology

## 2017-12-09 DIAGNOSIS — C541 Malignant neoplasm of endometrium: Secondary | ICD-10-CM | POA: Insufficient documentation

## 2017-12-09 NOTE — Progress Notes (Signed)
  Radiation Oncology         [phone] ________________________________  Name: Veronica Greene MRN: [id number]  Date: 12/09/2017  DOB: 16-Nov-1945  CC: Tower, [name], MD  [name], MD  HDR BRACHYTHERAPY NOTE  DIAGNOSIS: 72 y.o. female with StageIB grade1endometrioid endometrial adenocarcinoma   Simple treatment device note: Patient had construction of her custom vaginal cylinder. She will be treated with a 3.0 cm diameter segmented cylinder. This conforms to her anatomy without undue discomfort.  Vaginal brachytherapy procedure node: The patient was brought to the [place] suite. Identity was confirmed. All relevant records and images related to the planned course of therapy were reviewed. The patient freely provided informed written consent to proceed with treatment after reviewing the details related to the planned course of therapy. The consent form was witnessed and verified by the simulation staff. Then, the patient was set-up in a stable reproducible supine position for radiation therapy. Pelvic exam revealed the vaginal cuff to be intact. The patient's custom vaginal cylinder was placed in the proximal vagina. This was affixed to the CT/MR stabilization plate to prevent slippage. Patient tolerated the placement well.  Verification simulation note:  A fiducial marker was placed within the vaginal cylinder. An AP and lateral film was then obtained through the pelvis area. This documented accurate position of the vaginal cylinder for treatment.  HDR BRACHYTHERAPY TREATMENT  The remote afterloading device was affixed to the vaginal cylinder by catheter. Patient then proceeded to undergo her fourth high-dose-rate treatment directed at the proximal vagina. The patient was prescribed a dose of 6.0 gray to be delivered to the mucosal surface. Treatment length was 3.5 cm. Patient was treated with 1 channel using 8 dwell positions. Treatment time was 304.20 seconds. Iridium 192 was the  high-dose-rate source for treatment. The patient tolerated the treatment well. After completion of her therapy, a radiation survey was performed documenting return of the iridium source into the GammaMed safe. She had no further problems with abdominal cramping after her last high-dose-rate treatment.   PLAN: The patient will return next week for her fifth and final treatment. ________________________________   Blair Promise, PhD, MD  This document serves as a record of services personally performed by Gery Pray, MD. It was created on his behalf by Bethann Humble, a trained medical scribe. The creation of this record is based on the scribe's personal observations and the provider's statements to them. This document has been checked and approved by the attending provider.

## 2017-12-11 ENCOUNTER — Ambulatory Visit: Payer: PPO | Admitting: Radiation Oncology

## 2017-12-18 ENCOUNTER — Telehealth: Payer: Self-pay | Admitting: Oncology

## 2017-12-18 ENCOUNTER — Encounter: Payer: Self-pay | Admitting: Oncology

## 2017-12-18 ENCOUNTER — Telehealth: Payer: Self-pay | Admitting: *Deleted

## 2017-12-18 NOTE — Telephone Encounter (Signed)
CALLED PATIENT TO REMIND OF HDR [place] 12-19-17 @ 9 AM , SPOKE WITH PATIENT AND SHE IS AWARE OF THIS [place].

## 2017-12-18 NOTE — Telephone Encounter (Signed)
Veronica Greene called and requested a signed letter saying that she had radiation treatment on 12/05/17, 12/09/17 and 12/19/17 for her cancer policy.  She would like 3 separate letters.  Advised her that they will be ready for her tomorrow when she comes in for her treatment.

## 2017-12-19 ENCOUNTER — Ambulatory Visit
Admission: RE | Admit: 2017-12-19 | Discharge: 2017-12-19 | Disposition: A | Discharge status: Home / Self Care | Payer: PPO | Source: Ambulatory Visit | Attending: Radiation Oncology | Admitting: Radiation Oncology

## 2017-12-19 ENCOUNTER — Encounter: Payer: Self-pay | Admitting: Radiation Oncology

## 2017-12-19 DIAGNOSIS — C541 Malignant neoplasm of endometrium: Secondary | ICD-10-CM

## 2017-12-19 NOTE — Progress Notes (Signed)
  Radiation Oncology         [phone] ________________________________  Name: Veronica Greene MRN: [id number]  Date: 12/19/2017  DOB: [birth year]  End of Treatment Note  Diagnosis:   72 y.o.female withStageIB grade1endometrioid endometrial adenocarcinoma     Indication for treatment:  Curative       Radiation treatment dates:   Brachytherapy: 11/20/2017, 11/27/2017, 12/05/2017, 12/09/2017, 12/11/2017  Site/dose:  Vaginal cuff, 6 Gy in 5 fractions for a total dose of 30 Gy  Beams/energy:   HDR Ir-Vaginal Ir 192, 3.0 cm segmented cylinder  Narrative: The patient tolerated radiation treatment relatively well. No significant side effects during treatment.  Plan: The patient has completed radiation treatment. The patient will return to radiation oncology clinic for routine followup in one month. I advised them to call or return sooner if they have any questions or concerns related to their recovery or treatment.  -----------------------------------  Blair Promise, PhD, MD  This document serves as a record of services personally performed by Gery Pray, MD. It was created on his behalf by [facility], a trained medical scribe. The creation of this record is based on the scribe's personal observations and the provider's statements to them. This document has been checked and approved by the attending provider.

## 2017-12-19 NOTE — Progress Notes (Signed)
  Radiation Oncology         [phone] ________________________________  Name: Veronica Greene MRN: [id number]  Date: 12/19/2017  DOB: 10/29/1945  CC: Tower, [name], MD  [name], MD  HDR BRACHYTHERAPY NOTE  DIAGNOSIS:  72 y.o. female with StageIB grade1endometrioid endometrial adenocarcinoma   Simple treatment device note: Patient had construction of her custom vaginal cylinder. She will be treated with a 3.0 cm diameter segmented cylinder. This conforms to her anatomy without undue discomfort.  Vaginal brachytherapy procedure node: The patient was brought to the [place] suite. Identity was confirmed. All relevant records and images related to the planned course of therapy were reviewed. The patient freely provided informed written consent to proceed with treatment after reviewing the details related to the planned course of therapy. The consent form was witnessed and verified by the simulation staff. Then, the patient was set-up in a stable reproducible supine position for radiation therapy. Pelvic exam revealed the vaginal cuff to be intact . The patient's custom vaginal cylinder was placed in the proximal vagina. This was affixed to the CT/MR stabilization plate to prevent slippage. Patient tolerated the placement well.  Verification simulation note:  A fiducial marker was placed within the vaginal cylinder. An AP and lateral film was then obtained through the pelvis area. This documented accurate position of the vaginal cylinder for treatment.  HDR BRACHYTHERAPY TREATMENT  The remote afterloading device was affixed to the vaginal cylinder by catheter. Patient then proceeded to undergo her fifth high-dose-rate treatment directed at the proximal vagina. The patient was prescribed a dose of 6.0 gray to be delivered to the mucosal surface. Treatment length was 3.5 cm. Patient was treated with 1 channel using 8 dwell positions. Treatment time was 334.0 seconds. Iridium 192 was the  high-dose-rate source for treatment. The patient tolerated the treatment well. After completion of her therapy, a radiation survey was performed documenting return of the iridium source into the [employer] safe.   PLAN: the patient has completed her adjuvant brachytherapy treatments. She will be scheduled for routine follow-up in one month. ________________________________  Blair Promise, PhD, MD

## 2017-12-23 ENCOUNTER — Telehealth: Payer: Self-pay

## 2017-12-23 NOTE — Telephone Encounter (Signed)
Returned pt's call to make appt for October f/u with Dr Denman George- made for 10-11 at 1:30 pm. No other needs per pt at this time. Said she has one more appt with Dr Sondra Come.

## 2017-12-25 DIAGNOSIS — C44722 Squamous cell carcinoma of skin of right lower limb, including hip: Secondary | ICD-10-CM | POA: Diagnosis not present

## 2017-12-25 DIAGNOSIS — C44712 Basal cell carcinoma of skin of right lower limb, including hip: Secondary | ICD-10-CM | POA: Diagnosis not present

## 2017-12-25 DIAGNOSIS — D485 Neoplasm of uncertain behavior of skin: Secondary | ICD-10-CM | POA: Diagnosis not present

## 2017-12-25 DIAGNOSIS — L570 Actinic keratosis: Secondary | ICD-10-CM | POA: Diagnosis not present

## 2018-01-16 ENCOUNTER — Other Ambulatory Visit: Payer: Self-pay

## 2018-01-16 ENCOUNTER — Ambulatory Visit
Admission: RE | Admit: 2018-01-16 | Discharge: 2018-01-16 | Disposition: A | Discharge status: Home / Self Care | Payer: PPO | Source: Ambulatory Visit | Attending: Radiation Oncology | Admitting: Radiation Oncology

## 2018-01-16 ENCOUNTER — Encounter: Payer: Self-pay | Admitting: Radiation Oncology

## 2018-01-16 VITALS — BP 148/62 | HR 66 | Temp 98.0°F | Wt 186.0 lb

## 2018-01-16 DIAGNOSIS — Z923 Personal history of irradiation: Secondary | ICD-10-CM | POA: Insufficient documentation

## 2018-01-16 DIAGNOSIS — Z79899 Other long term (current) drug therapy: Secondary | ICD-10-CM | POA: Diagnosis not present

## 2018-01-16 DIAGNOSIS — R300 Dysuria: Secondary | ICD-10-CM | POA: Diagnosis not present

## 2018-01-16 DIAGNOSIS — C541 Malignant neoplasm of endometrium: Secondary | ICD-10-CM | POA: Diagnosis not present

## 2018-01-16 DIAGNOSIS — Z7982 Long term (current) use of aspirin: Secondary | ICD-10-CM | POA: Insufficient documentation

## 2018-01-16 DIAGNOSIS — Z08 Encounter for follow-up examination after completed treatment for malignant neoplasm: Secondary | ICD-10-CM | POA: Diagnosis not present

## 2018-01-16 LAB — URINALYSIS, COMPLETE (UACMP) WITH MICROSCOPIC
Bilirubin Urine: NEGATIVE
Glucose, UA: NEGATIVE mg/dL
HGB URINE DIPSTICK: NEGATIVE
Ketones, ur: NEGATIVE mg/dL
Leukocytes, UA: NEGATIVE
Nitrite: NEGATIVE
PROTEIN: NEGATIVE mg/dL
Specific Gravity, Urine: 1.006 (ref 1.005–1.030)
pH: 6 (ref 5.0–8.0)

## 2018-01-16 NOTE — Progress Notes (Signed)
  Home Care Instructions for the Insertion and Care of Your Vaginal Dilator  Why Do I Need a Vaginal Dilator?  Internal radiation therapy may cause scar tissue to form at the top of your vagina (vaginal cuff).  This may make vaginal examinations difficult in the future. You can prevent scar tissue from forming by using a vaginal dilator (a smooth plastic rod), and/or by having regular sexual intercourse.  If not using the dilator you should be having intercourse two or three times a week.  If you are unable to have intercourse, you should use your vaginal dilator.  You may have some spotting or bleeding from your dilator or intercourse the first few times. You may also have some discomfort. If discomfort occurs with intercourse, you and your partner may need to stop for a while and try again later.  How to Use Your Vaginal Dilator  - Wash the dilator with soap and water before and after each use. - Check the dilator to be sure it is smooth. Do not use the dilator if you find any roughspots. - Coat the dilator with K-Y Jelly, Astroglide, or Replens. Do not use Vaseline, baby oil, or other oil based lubricants. They are not water-soluble and can be irritating to the tissues in the vagina. - Lie on your back with your knees bent and legs apart. - Insert the rounded end of the dilator into your vagina as far as it will go without causing pain or discomfort. - Close your knees and slowly straighten your legs. - Keep the dilator in your vagina for about 10 to 15 minutes.  Please use 3 times a week, for example: Monday, Wednesday and Friday evenings. [facility] your knees, open your legs, and gently remove the dilator. - Gently cleanse the skin around the vaginal opening. - Wash the dilator after each use. -  It is important that you use the dilator routinely until instructed otherwise by your doctor.  Pt verbalized understanding of how to use the vaginal dilator.

## 2018-01-16 NOTE — Progress Notes (Signed)
Radiation Oncology         [phone] ________________________________  Name: Veronica Greene MRN: [id number]  Date: 01/16/2018  DOB: March 10, 1946  Follow-Up Visit Note  CC: Tower, [name], MD  [name], MD    ICD-10-CM   1. Endometrial cancer ([place]) C54.1   2. Dysuria R30.0 Urine culture    CANCELED: Urinalysis, Routine w reflex microscopic    Diagnosis:  Stage IB Grade 1 Endometrial Cancer  Interval Since Last Radiation:  28 days  Narrative:  The patient returns today for routine follow-up. She is accompanied by her husband. They recently returned from vacation. She has an appointment in October with Dr. Denman George.                               She notes excessive urination since radiation, and mild irregular pain. She denies blood in the urine, and any other symptoms. She agrees to a urine test.  ALLERGIES:  is allergic to clarithromycin; codeine; paroxetine; and penicillins.  Meds: Current Outpatient Medications  Medication Sig Dispense Refill  . acetaminophen (TYLENOL 8 HOUR ARTHRITIS PAIN) 650 MG CR tablet Take 650 mg by mouth daily as needed for pain.     Marland Kitchen acetic acid-hydrocortisone (ACETASOL HC) OTIC solution Place 2 drops into both ears 2 (two) times daily as needed (itching).    . Ascorbic Acid (VITAMIN C) 1000 MG tablet Take 1,000 mg by mouth daily with breakfast.     . aspirin 81 MG EC tablet Take 1 tablet (81 mg total) by mouth daily. DO NOT TAKE AGAIN UNTIL 07/11/2015 (Patient taking differently: Take 81 mg by mouth daily. ) 30 tablet 12  . Calcium-Vitamin D-Vitamin K (VIACTIV) 854-627-03 MG-UNT-MCG CHEW Chew 1 tablet by mouth 2 (two) times daily. Morning & afternoon    . cetirizine (ZYRTEC) 10 MG tablet Take 5 mg by mouth at bedtime.     . cholecalciferol (VITAMIN D) 1000 UNITS tablet Take 1,000 Units by mouth daily with breakfast.     . esomeprazole (NEXIUM) 20 MG capsule Take 40 mg by mouth daily before breakfast.    . fluorouracil (EFUDEX) 5 % cream Apply topically  2 (two) times daily.    . fluticasone (FLONASE) 50 MCG/ACT nasal spray Place 2 sprays into both nostrils at bedtime.     . hydrochlorothiazide (HYDRODIURIL) 25 MG tablet Take 1 tablet (25 mg total) by mouth daily with lunch. 90 tablet 3  . lubiprostone (AMITIZA) 24 MCG capsule Take 1 capsule (24 mcg total) by mouth 2 (two) times daily. LUNCH & SUPPER 180 capsule 3  . meloxicam (MOBIC) 7.5 MG tablet Take 1 tablet (7.5 mg total) by mouth at bedtime. 90 tablet 3  . Multiple Vitamin (MULTIVITAMIN WITH MINERALS) TABS tablet Take 1 tablet by mouth daily. WOMEN'S MULTIVITAMIN    . Polyethyl Glycol-Propyl Glycol (SYSTANE) 0.4-0.3 % SOLN Place 1 drop into both eyes 2 (two) times daily.    . potassium chloride (K-DUR) 10 MEQ tablet Take 2 tablets (20 mEq total) by mouth 2 (two) times daily. 360 tablet 3  . sennosides-docusate sodium (SENOKOT-S) 8.6-50 MG tablet Take 1 tablet by mouth daily with supper.     . Simethicone (PHAZYME PO) Take 1 tablet by mouth 4 (four) times daily as needed (for gas (typically one tablet at night)).     . sodium chloride (AYR) 0.65 % nasal spray Place 1 spray into the nose 4 (four) times daily  as needed for congestion.    Marland Kitchen venlafaxine XR (EFFEXOR XR) 75 MG 24 hr capsule Take 1 capsule (75 mg total) by mouth daily after breakfast. 90 capsule 3   No current facility-administered medications for this encounter.     Physical Findings: The patient is in no acute distress. Patient is alert and oriented.  weight is 186 lb (84.4 kg). Her oral temperature is 98 F (36.7 C). Her blood pressure is 148/62 (abnormal) and her pulse is 66. Her oxygen saturation is 100%. .  No significant changes. Lungs are clear to auscultation bilaterally. Heart has regular rate and rhythm. No palpable cervical, supraclavicular, or axillary adenopathy. Abdomen soft, non-tender, normal bowel sounds. Pelvic exam deferred in light of recent radiation treatment completion.  Lab Findings: Lab Results    Component Value Date   WBC 5.7 10/09/2017   HGB 11.7 (L) 10/09/2017   HCT 34.6 (L) 10/09/2017   MCV 89.9 10/09/2017   PLT 215 10/09/2017    Radiographic Findings: No results found.  Impression:  Clinically stable. The patient is not experiencing any residual side effects from her vaginal brachytherapy. In light of her urinary symptoms patient will present to the lab for urinalysis culture and sensitivity.  Plan:  She will submit a urine sample to rule out a UTI. She will see Dr. Denman George in October and follow up with me in 6 months. The patient was given a vaginal dilator and instructions on its use.  -----------------------------------  Blair Promise, PhD, MD  This document serves as a record of services personally performed by Gery Pray, MD. It was created on his behalf by Wilburn Mylar, a trained medical scribe. The creation of this record is based on the scribe's personal observations and the provider's statements to them. This document has been checked and approved by the attending provider.

## 2018-01-16 NOTE — Progress Notes (Signed)
Pt here today for a 1 month follow-up for endometrial cancer. Pt denies having pain. Pt states that she has soreness on the right side of the pelvic area. Pt states mild fatigue and will rest to help. Pt denies having any diarrhea. Pt denies having any nausea or vomiting. Pt states occasional dysuria. Pt states urgency and is able to make it to the bathroom. Pt denies having hematuria. Pt denies having any vaginal discharge. Pt states that she has a slight odor. Pt denies having any vaginal or rectal bleeding. Pt denies having any skin irritation.  BP (!) 148/62   Pulse 66   Temp 98 F (36.7 C) (Oral)   Wt 186 lb (84.4 kg)   SpO2 100%   BMI 30.25 kg/m   Wt Readings from Last 3 Encounters:  01/16/18 186 lb (84.4 kg)  11/20/17 189 lb 6.4 oz (85.9 kg)  11/11/17 190 lb 4 oz (86.3 kg)

## 2018-01-18 LAB — URINE CULTURE

## 2018-01-20 ENCOUNTER — Other Ambulatory Visit: Payer: Self-pay | Admitting: Radiation Oncology

## 2018-01-20 ENCOUNTER — Telehealth: Payer: Self-pay

## 2018-01-20 DIAGNOSIS — R300 Dysuria: Secondary | ICD-10-CM

## 2018-01-20 MED ORDER — NITROFURANTOIN MONOHYD MACRO 100 MG PO CAPS: 100.0000 mg | ORAL_CAPSULE | Freq: Two times a day (BID) | ORAL | 0 refills | Status: DC

## 2018-01-20 NOTE — Progress Notes (Signed)
ICD-10-CM   1. Dysuria R30.0 nitrofurantoin, macrocrystal-monohydrate, (MACROBID) 100 MG capsule    Urine culture results, and chart, reviewed. Above Rx made in Dr Clabe Seal absence.  Nursing called patient to inform her of the above.  -----------------------------------  Eppie Gibson, MD

## 2018-01-20 NOTE — Telephone Encounter (Signed)
Returning call to pt re: recent UA results, which showed UTI. Left VM that Dr. Isidore Moos would prescribe antibiotic to [facility]. Loma Sousa, RN BSN

## 2018-01-29 ENCOUNTER — Other Ambulatory Visit: Payer: Self-pay | Admitting: Family Medicine

## 2018-01-29 DIAGNOSIS — Z1231 Encounter for screening mammogram for malignant neoplasm of breast: Secondary | ICD-10-CM

## 2018-02-20 ENCOUNTER — Other Ambulatory Visit: Payer: Self-pay

## 2018-02-20 ENCOUNTER — Telehealth: Payer: Self-pay

## 2018-02-20 ENCOUNTER — Ambulatory Visit
Admission: RE | Admit: 2018-02-20 | Discharge: 2018-02-20 | Disposition: A | Discharge status: Home / Self Care | Payer: PPO | Source: Ambulatory Visit | Attending: Radiation Oncology | Admitting: Radiation Oncology

## 2018-02-20 DIAGNOSIS — R300 Dysuria: Secondary | ICD-10-CM

## 2018-02-20 DIAGNOSIS — C541 Malignant neoplasm of endometrium: Secondary | ICD-10-CM | POA: Insufficient documentation

## 2018-02-20 LAB — URINALYSIS, COMPLETE (UACMP) WITH MICROSCOPIC
Bilirubin Urine: NEGATIVE
Glucose, UA: NEGATIVE mg/dL
Hgb urine dipstick: NEGATIVE
KETONES UR: NEGATIVE mg/dL
Leukocytes, UA: NEGATIVE
Nitrite: NEGATIVE
PH: 5 (ref 5.0–8.0)
Protein, ur: NEGATIVE mg/dL
SPECIFIC GRAVITY, URINE: 1.006 (ref 1.005–1.030)

## 2018-02-20 NOTE — Telephone Encounter (Signed)
Pt called inquiring about having another urine sample collected. Stated Dr. Sondra Come took one during her last appointment on 01/16/18, she had a UTI, and was started on antibiotics. I asked her if she had finished the course of antibiotics, and if so was she still still symptomatic. She stated she had taken the full course, but that any time she went to void after intercourse "it burned real bad". Informed her Dr. Sondra Come was out of the office this week so I would need to discuss with another clinician and would get back to her. Spoke with Alean Rinne and received verbal orders to have pt come in and collect another UA and culture. Orders placed and lab appt made. Called pt back with plan. Pt verbalized understanding and agreement.

## 2018-02-20 NOTE — Telephone Encounter (Signed)
Called pt back regarding her UA results from today. Told her that per Alean Rinne the results from today are not concerning, and that she can take OTC Azo if she continues to have discomfort while voiding. Informed her that myself or another RN would contact her once her urine cultures resulted to let her know if the doctor/PA recommends antibiotics. Instructed her to call back should she have a other issues/concerns. Pt verbalized understanding and agreement.

## 2018-02-21 LAB — URINE CULTURE: Culture: NO GROWTH

## 2018-02-25 ENCOUNTER — Telehealth: Payer: Self-pay

## 2018-02-25 NOTE — Telephone Encounter (Signed)
Pt calling to obtain results of Urine culture. Conveyed to pt that culture showed no growth. Pt verbalized understanding and agreement. Loma Sousa, RN BSN

## 2018-03-31 DIAGNOSIS — R202 Paresthesia of skin: Secondary | ICD-10-CM | POA: Diagnosis not present

## 2018-03-31 DIAGNOSIS — L578 Other skin changes due to chronic exposure to nonionizing radiation: Secondary | ICD-10-CM | POA: Diagnosis not present

## 2018-03-31 DIAGNOSIS — D229 Melanocytic nevi, unspecified: Secondary | ICD-10-CM | POA: Diagnosis not present

## 2018-03-31 DIAGNOSIS — Z85828 Personal history of other malignant neoplasm of skin: Secondary | ICD-10-CM | POA: Diagnosis not present

## 2018-03-31 DIAGNOSIS — L570 Actinic keratosis: Secondary | ICD-10-CM | POA: Diagnosis not present

## 2018-04-02 DIAGNOSIS — H43391 Other vitreous opacities, right eye: Secondary | ICD-10-CM | POA: Diagnosis not present

## 2018-04-02 DIAGNOSIS — H43811 Vitreous degeneration, right eye: Secondary | ICD-10-CM | POA: Diagnosis not present

## 2018-04-03 ENCOUNTER — Telehealth: Payer: Self-pay | Admitting: Gastroenterology

## 2018-04-04 NOTE — Telephone Encounter (Signed)
OK 

## 2018-04-07 NOTE — Telephone Encounter (Signed)
Dr. Carlean Purl will you approve the switch?

## 2018-04-09 ENCOUNTER — Telehealth: Payer: Self-pay

## 2018-04-09 NOTE — Telephone Encounter (Signed)
Pt calling to inquire if Dr. Sondra Come was going to order any scans prior to upcoming appt with Dr. Denman George on 04/18/18. Conveyed to pt that Dr. Sondra Come had not ordered any tests. Confirmed pt's January 2020 appt with Dr. Sondra Come. Pt verbalized understanding and agreement. Loma Sousa, RN BSN

## 2018-04-09 NOTE — Telephone Encounter (Signed)
LM on vmail to call back to schedule 

## 2018-04-09 NOTE — Telephone Encounter (Signed)
OK 

## 2018-04-11 ENCOUNTER — Encounter: Payer: Self-pay | Admitting: Internal Medicine

## 2018-04-18 ENCOUNTER — Encounter: Payer: Self-pay | Admitting: Gynecologic Oncology

## 2018-04-18 ENCOUNTER — Telehealth: Payer: Self-pay | Admitting: Oncology

## 2018-04-18 ENCOUNTER — Inpatient Hospital Stay: Payer: PPO | Attending: Gynecologic Oncology | Admitting: Gynecologic Oncology

## 2018-04-18 VITALS — BP 155/65 | HR 68 | Temp 98.4°F | Resp 16 | Ht 65.0 in | Wt 185.0 lb

## 2018-04-18 DIAGNOSIS — C541 Malignant neoplasm of endometrium: Secondary | ICD-10-CM

## 2018-04-18 DIAGNOSIS — Z90722 Acquired absence of ovaries, bilateral: Secondary | ICD-10-CM | POA: Diagnosis not present

## 2018-04-18 DIAGNOSIS — R1031 Right lower quadrant pain: Secondary | ICD-10-CM | POA: Diagnosis not present

## 2018-04-18 DIAGNOSIS — Z90710 Acquired absence of both cervix and uterus: Secondary | ICD-10-CM | POA: Insufficient documentation

## 2018-04-18 DIAGNOSIS — Z923 Personal history of irradiation: Secondary | ICD-10-CM | POA: Diagnosis not present

## 2018-04-18 NOTE — Progress Notes (Signed)
Follow-up Note: Gyn-Onc  Consult was requested by Dr. Landry Mellow for the evaluation of Veronica Greene 72 y.o. female  CC:  Chief Complaint  Patient presents with  . Endometrial cancer [facility])    Assessment/Plan:  Veronica. Veronica Greene  is a 72 y.o.  year old with stage IB grade 2 endometrioid endometrial adenocarcinoma (MSI pending).  High/intermediate risk factors for recurrence. s/p vaginal brachytherapy completed 12/19/17.  I recommend she follow-up at 3 monthly intervals for symptom review, physical examination and pelvic examination. Pap smear is not recommended in routine endometrial cancer surveillance. After 2 years we will space these visits to every 6 months, and then annually if recurrence has not developed within 5 years. All questions were answered.  No evidence for recurrence, however, if the right lower quadrant pain worsens, she will notify us and we will order CT imaging.    HPI: Veronica Greene is a 72 year old parous woman who is seen in consultation at the request of Dr Landry Mellow for grade 1 endometrial cancer.  She first began vaginal spotting in July 2017.  She was seen for an annual visit with Dr. Landry Mellow in October 2018 and reported this symptom.  A transvaginal ultrasound scan was performed on June 25, 2017 and revealed a normal-sized uterus measuring 7 x 4 x 2.7 cm with normal ovaries bilaterally.  The endometrial thickness was 1.28 cm.  On saline infusion the endometrial hyperechoic mass measured 7 mm.  She was noted to have a systolic cardiac murmur and was seen by cardiology who performed a transthoracic echo on July 30, 2017.  This revealed moderate aortic stenosis.  The left ventricle was grossly normal in cavity size, though with some increased ventricular wall thickness.  Ejection fraction was 60-65%.  She was cleared for surgical procedure.  Of note she was asymptomatic from this aortic stenosis.  She has a remote history of rheumatic heart disease as a child.  On August 09, 2017 she underwent a hysteroscopy D&C.  Final pathology confirmed a FIGO grade 1 endometrioid endometrial adenocarcinoma.  The patient is otherwise very healthy.  She has had no prior abdominal surgeries.  Interval Hx:  On 10/08/17 she underwent robotic assisted total hysterectomy, BSO and SLN biopsy. Final pathology revealed a 3.2cm grade 2 endometrioid endometrial adenocarcinoma with outer half myo invasion (1 of 1.2cm) and no LVSI. All SLN's were negative as were adnexa and cervix. MSI pending. She was determined to have high/intermediate risk factors and was recommended to have vaginal brachytherapy in accordance with NCCN guidelines.  She completed vaginal brachytherapy with 30Gy in 5 fractions on 12/19/17. She has had some intermittent pains in her right lower quadrant.  Current Meds:  Outpatient Encounter Medications as of 04/18/2018  Medication Sig  . acetaminophen (TYLENOL 8 HOUR ARTHRITIS PAIN) 650 MG CR tablet Take 650 mg by mouth daily as needed for pain.   Marland Kitchen acetic acid-hydrocortisone (ACETASOL HC) OTIC solution Place 2 drops into both ears 2 (two) times daily as needed (itching).  . Ascorbic Acid (VITAMIN C) 1000 MG tablet Take 1,000 mg by mouth daily with breakfast.   . Calcium-Vitamin D-Vitamin K (VIACTIV) 825-003-70 MG-UNT-MCG CHEW Chew 1 tablet by mouth 2 (two) times daily. Morning & afternoon  . cetirizine (ZYRTEC) 10 MG tablet Take 5 mg by mouth as needed for allergies (alternates between mucinex & zyrtec for sinus symptoms).   . cholecalciferol (VITAMIN D) 1000 UNITS tablet Take 1,000 Units by mouth daily with breakfast.   .  esomeprazole (NEXIUM) 20 MG capsule Take 40 mg by mouth daily before breakfast.  . fluticasone (FLONASE) 50 MCG/ACT nasal spray Place 2 sprays into both nostrils at bedtime.   Marland Kitchen guaiFENesin (MUCINEX) 600 MG 12 hr tablet Take 600 mg by mouth daily.  . hydrochlorothiazide (HYDRODIURIL) 25 MG tablet Take 1 tablet (25 mg total) by mouth daily with lunch.   . lubiprostone (AMITIZA) 24 MCG capsule Take 1 capsule (24 mcg total) by mouth 2 (two) times daily. LUNCH & SUPPER  . meloxicam (MOBIC) 7.5 MG tablet Take 1 tablet (7.5 mg total) by mouth at bedtime.  . Multiple Vitamin (MULTIVITAMIN WITH MINERALS) TABS tablet Take 1 tablet by mouth daily. WOMEN'S MULTIVITAMIN  . Polyethyl Glycol-Propyl Glycol (SYSTANE) 0.4-0.3 % SOLN Place 1 drop into both eyes 2 (two) times daily.  . potassium chloride (K-DUR) 10 MEQ tablet Take 2 tablets (20 mEq total) by mouth 2 (two) times daily.  . sennosides-docusate sodium (SENOKOT-S) 8.6-50 MG tablet Take 1 tablet by mouth daily with supper.   . Simethicone (PHAZYME PO) Take 1 tablet by mouth 4 (four) times daily as needed (for gas (typically one tablet at night)).   . sodium chloride (AYR) 0.65 % nasal spray Place 1 spray into the nose 4 (four) times daily as needed for congestion.  . TURMERIC PO Take 1 tablet by mouth daily.  Marland Kitchen venlafaxine XR (EFFEXOR XR) 75 MG 24 hr capsule Take 1 capsule (75 mg total) by mouth daily after breakfast.  . aspirin 81 MG EC tablet Take 1 tablet (81 mg total) by mouth daily. DO NOT TAKE AGAIN UNTIL 07/11/2015 (Patient not taking: Reported on 04/18/2018)  . [DISCONTINUED] fluorouracil (EFUDEX) 5 % cream Apply topically 2 (two) times daily.  . [DISCONTINUED] nitrofurantoin, macrocrystal-monohydrate, (MACROBID) 100 MG capsule Take 1 capsule (100 mg total) by mouth 2 (two) times daily. Take for 5 days. (Patient not taking: Reported on 04/18/2018)   No facility-administered encounter medications on file as of 04/18/2018.     Allergy:  Allergies  Allergen Reactions  . Clarithromycin Swelling    throat swells  . Codeine Nausea And Vomiting  . Paroxetine Other (See Comments)    Ineffective.  Marland Kitchen Penicillins Hives    welts Has patient had a PCN reaction causing immediate rash, facial/tongue/throat swelling, SOB or lightheadedness with hypotension: No Has patient had a PCN reaction causing  severe rash involving mucus membranes or skin necrosis: Unknown Has patient had a PCN reaction that required hospitalization:No Has patient had a PCN reaction occurring within the last 10 years: No If all of the above answers are "NO", then may proceed with Cephalosporin use.     Social Hx:   Social History   Socioeconomic History  . Marital status: Married    Spouse name: Not on file  . Number of children: 2  . Years of education: Not on file  . Highest education level: High school graduate  Occupational History  . 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 Financial resource strain: Not on file  . Food insecurity:    Worry: Not on file    Inability: Not on file  . Transportation needs:    Medical: Not on file    Non-medical: Not on file  Tobacco Use  . Smoking status: Never Smoker  . Smokeless tobacco: Never Used  Substance and Sexual Activity  . Alcohol use: No    Alcohol/week: 0.0 standard  drinks  . Drug use: No  . Sexual activity: Yes    Birth control/protection: Post-menopausal  Lifestyle  . Physical activity:    Days per week: Not on file    Minutes per session: Not on file  . Stress: Not on file  Relationships  . Social connections:    Talks on phone: Not on file    Gets together: Not on file    Attends religious service: Not on file    Active member of club or organization: Not on file    Attends meetings of clubs or organizations: Not on file    Relationship status: Not on file  . Intimate partner violence:    Fear of current or ex partner: Not on file    Emotionally abused: Not on file    Physically abused: Not on file    Forced sexual activity: Not on file  Other Topics Concern  . Not on file  Social History Narrative   She is relatively recently remarried.  She has 2  children and 3 grandchildren.     She is currently retired.  But enjoys cleaning house and doing chores.  She likes  to do yard work.  Does not routinely exercise.    Past Surgical Hx:  Past Surgical History:  Procedure Laterality Date  . ANKLE SURGERY Right 1956  . CARPAL TUNNEL RELEASE Bilateral 1986, 1993  . COLONOSCOPY N/A 06/24/2015   Procedure: COLONOSCOPY;  Surgeon: Gatha Mayer, MD;  Location: [facility];  Service: Endoscopy;  Laterality: N/A;  . DILATATION & CURETTAGE/HYSTEROSCOPY WITH MYOSURE N/A 08/09/2017   Procedure: DILATATION & CURETTAGE/HYSTEROSCOPY WITH MYOSURE;  Surgeon: Christophe Louis, MD;  Location: [facility];  Service: Gynecology;  Laterality: N/A;  Polypectomy  . DILATION AND CURETTAGE OF UTERUS     PMB  . HAND SURGERY Bilateral 1999   Thumbs   . KNEE SURGERY Bilateral 1998   x2  . LYMPH NODE BIOPSY N/A 10/08/2017   Procedure: LYMPH NODE BIOPSY;  Surgeon: [name], MD;  Location: [facility];  Service: Gynecology;  Laterality: N/A;  . MULTIPLE TOOTH EXTRACTIONS    . NASAL SINUS SURGERY  1976  . polio surgery.     right foot - right leg 1.5" shorter than left leg - some wekness  . ROBOTIC ASSISTED TOTAL HYSTERECTOMY WITH BILATERAL SALPINGO OOPHERECTOMY Bilateral 10/08/2017   Procedure: XI ROBOTIC ASSISTED TOTAL HYSTERECTOMY WITH BILATERAL SALPINGO OOPHORECTOMY;  Surgeon: [name], MD;  Location: [facility];  Service: Gynecology;  Laterality: Bilateral;  . THUMB ARTHROSCOPY  1999  . TONSILLECTOMY  8649   72 years old  . TRANSTHORACIC ECHOCARDIOGRAM  07/30/2017   mild LVH, ef 60-45%, grade 2 diastolic dysfunction/ moderately thickened and mild calcified AV leaflets with moderate stenosis & mild AR (valve area 1.15cm^2, mean gradiant 76mHg, peak grandient 431mg)/ mild LAE and RAE/ trivial TR  . TUBAL LIGATION    . UPPER GI ENDOSCOPY     normal  . WISDOM TOOTH EXTRACTION      Past Medical Hx:  Past Medical History:  Diagnosis Date  . Allergic rhinitis   . Arthritis    knees  . Depression   . Depression with anxiety   [facility]   [facility]  Diagnosed with  D&C/hysteroscopy with polypectomy  . Fibromyalgia    tx mobic  . Full dentures   . GERD (gastroesophageal reflux disease)   . Gout   . Hearing loss    wears bilateral hearing  aids  . History of colon polyps   . History of nonmelanoma skin cancer    right lower leg  . Hypertension   . IBS (irritable bowel syndrome)    with constipation  . Moderate aortic stenosis by prior echocardiogram 07/30/2017---  cardiologist-- dr harding   Previously diagnosed with murmur back in 2013. echo 07-30-2017 : Moderate AS with mild AR,  valve area 1.15cm^2,  Mean gradient 23 mmHg., peak gradient 26mHg  . Osteopenia   . Polio age 73 months  right leg weakness and 1.5 inches shorter than left leg.   . Skin cancer 2019   Right leg  . Urgency of urination   . UTI (urinary tract infection) 01/2018  . Wears hearing aid in both ears     Past Gynecological History:  Endometrial cancer. No LMP recorded. Patient is postmenopausal.  Family Hx:  Family History  Problem Relation Age of Onset  . Kidney disease Mother   . Alzheimer's disease Mother   . Stroke Father 771 . Colon cancer Father 739 . Kidney disease Sister   . Heart attack Sister   . Cancer Paternal Aunt        COLON CA  . Colon cancer Paternal Aunt [phone]   Review of Systems:  Constitutional  Feels well,    ENT Normal appearing ears and nares bilaterally Skin/Breast  No rash, sores, jaundice, itching, dryness Cardiovascular  No chest pain, shortness of breath, or edema  Pulmonary  No cough or wheeze.  Gastro Intestinal  No nausea, vomitting, or diarrhoea. No bright red blood per rectum, no change in bowel movement, or constipation. + intermittent RLQ pains Genito Urinary  No frequency, urgency, dysuria, no postmenopausal bleeding. Musculo Skeletal  No myalgia, arthralgia, joint swelling or pain  Neurologic  No weakness, numbness, change in gait,  Psychology  No depression, anxiety, insomnia.   Vitals:  Blood pressure (!)  155/65, pulse 68, temperature 98.4 F (36.9 C), temperature source Oral, resp. rate 16, height 5' 5"  (1.651 m), weight 185 lb (83.9 kg), SpO2 99 %.  Physical Exam: WD in NAD Neck  Supple NROM, without any enlargements.  Lymph Node Survey No cervical supraclavicular or inguinal adenopathy Cardiovascular  Pulse normal rate, regularity and rhythm.  Systolic murmur.  Lungs  Clear to auscultation bilateraly, without wheezes/crackles/rhonchi. Good air movement.  Skin  No rash/lesions/breakdown  Psychiatry  Alert and oriented to person, place, and time  Abdomen  Normoactive bowel sounds, abdomen soft, non-tender and obese without evidence of hernia. Well healed incisions.  Back No CVA tenderness Genito Urinary  External genitalia normal. Vaginal cuff healed, no lesions or masses visible or palpable.  Rectal  deferred Extremities  No bilateral cyanosis, clubbing or edema.  EThereasa Solo MD  04/18/2018, 2:04 PM

## 2018-04-18 NOTE — Telephone Encounter (Signed)
Requested MSI testing on Accession [id number] with Suanne Marker in [facility].

## 2018-04-18 NOTE — Patient Instructions (Signed)
Please notify Dr Denman George at phone number [phone] if you notice vaginal bleeding, new pelvic or abdominal pains, bloating, feeling full easy, or a change in bladder or bowel function.   Please follow-up with Dr Sondra Come as scheduled. Please have his office call Dr  Serita Grit office at checkout to schedule follow-up in April 2020 with her.  If your right sided abdominal pain becomes more constant or intense, please notify her office.

## 2018-04-21 ENCOUNTER — Encounter: Payer: Self-pay | Admitting: *Deleted

## 2018-04-21 ENCOUNTER — Other Ambulatory Visit (HOSPITAL_COMMUNITY)
Admission: RE | Admit: 2018-04-21 | Discharge: 2018-04-21 | Disposition: A | Discharge status: Home / Self Care | Payer: PPO | Source: Ambulatory Visit | Attending: Gynecologic Oncology | Admitting: Gynecologic Oncology

## 2018-04-21 ENCOUNTER — Ambulatory Visit: Payer: PPO | Admitting: Podiatry

## 2018-04-21 DIAGNOSIS — R590 Localized enlarged lymph nodes: Secondary | ICD-10-CM | POA: Diagnosis not present

## 2018-04-21 DIAGNOSIS — Z8542 Personal history of malignant neoplasm of other parts of uterus: Secondary | ICD-10-CM | POA: Diagnosis not present

## 2018-04-21 DIAGNOSIS — D4959 Neoplasm of unspecified behavior of other genitourinary organ: Secondary | ICD-10-CM | POA: Insufficient documentation

## 2018-04-22 ENCOUNTER — Ambulatory Visit: Payer: PPO | Admitting: Family Medicine

## 2018-04-22 ENCOUNTER — Ambulatory Visit (INDEPENDENT_AMBULATORY_CARE_PROVIDER_SITE_OTHER): Payer: PPO | Admitting: Family Medicine

## 2018-04-22 ENCOUNTER — Ambulatory Visit (INDEPENDENT_AMBULATORY_CARE_PROVIDER_SITE_OTHER)
Admission: RE | Admit: 2018-04-22 | Discharge: 2018-04-22 | Disposition: A | Discharge status: Home / Self Care | Payer: PPO | Source: Ambulatory Visit | Attending: Family Medicine | Admitting: Family Medicine

## 2018-04-22 ENCOUNTER — Encounter: Payer: Self-pay | Admitting: Family Medicine

## 2018-04-22 VITALS — BP 143/70 | HR 71 | Temp 98.0°F | Ht 65.75 in | Wt 185.2 lb

## 2018-04-22 DIAGNOSIS — R1031 Right lower quadrant pain: Secondary | ICD-10-CM

## 2018-04-22 DIAGNOSIS — R350 Frequency of micturition: Secondary | ICD-10-CM | POA: Insufficient documentation

## 2018-04-22 DIAGNOSIS — Y92009 Unspecified place in unspecified non-institutional (private) residence as the place of occurrence of the external cause: Secondary | ICD-10-CM

## 2018-04-22 DIAGNOSIS — M25551 Pain in right hip: Secondary | ICD-10-CM

## 2018-04-22 DIAGNOSIS — W19XXXA Unspecified fall, initial encounter: Secondary | ICD-10-CM

## 2018-04-22 DIAGNOSIS — R102 Pelvic and perineal pain: Secondary | ICD-10-CM | POA: Diagnosis not present

## 2018-04-22 DIAGNOSIS — Z23 Encounter for immunization: Secondary | ICD-10-CM | POA: Diagnosis not present

## 2018-04-22 DIAGNOSIS — S79911A Unspecified injury of right hip, initial encounter: Secondary | ICD-10-CM | POA: Diagnosis not present

## 2018-04-22 LAB — POC URINALSYSI DIPSTICK (AUTOMATED)
Bilirubin, UA: NEGATIVE
GLUCOSE UA: NEGATIVE
Ketones, UA: NEGATIVE
Leukocytes, UA: NEGATIVE
NITRITE UA: NEGATIVE
PROTEIN UA: NEGATIVE
Spec Grav, UA: 1.02 (ref 1.010–1.025)
UROBILINOGEN UA: 0.2 U/dL
pH, UA: 6 (ref 5.0–8.0)

## 2018-04-22 MED ORDER — METHOCARBAMOL 500 MG PO TABS: 500.0000 mg | ORAL_TABLET | Freq: Three times a day (TID) | ORAL | 1 refills | Status: DC | PRN

## 2018-04-22 MED ORDER — IBUPROFEN 800 MG PO TABS
800.0000 mg | ORAL_TABLET | Freq: Three times a day (TID) | ORAL | 0 refills | Status: DC | PRN
Start: 2018-04-22 — End: 2018-11-25

## 2018-04-22 NOTE — Patient Instructions (Signed)
We will check a hip xray today   Take ibuprofen 800 mg with food up to every 8 hours (do not mix with meloxicam)  Tylenol over the counter is ok 650 mg every 4-6 hours  Try the generic robaxin (muscle relaxer) - caution of sedation   Ice on areas that hurt is fine   We will send urine for a culture

## 2018-04-22 NOTE — Assessment & Plan Note (Signed)
In pt with recent tx of endometrial cancer sm blood in urine Sent for cx No dysuria or other symptoms

## 2018-04-22 NOTE — Assessment & Plan Note (Signed)
R groin/hip pain -worse after fall  Xray today  May be muscular strain  Reassuring exam  Consider return to chiropractor  Continue shoe lift  Ice/heat Methocarbamol prn spasm /pain  ibuprofe 800 tid prn pain with food  Update if not starting to improve in a week or if worsening

## 2018-04-22 NOTE — Assessment & Plan Note (Signed)
With R hip/groin pain but nl gait (baseline leg length discrepancy)  Disc safety/prev falls at home  No head injury

## 2018-04-22 NOTE — Progress Notes (Signed)
Subjective:    Patient ID: Veronica Greene, female    DOB: [birth year], 72 y.o.   MRN: [id number]  HPI Here s/p fall with back and R groin pain   On deck watering flowers- Friday  Tripped on hose [place] on R side Scraped R forearm Fell on L knee  Then onto buttocks   A lot of pain in R pelvis area   (had twinges of it before fall)  Back is sore also - gradually improving   Took 2 ibuprofen yesterday and some tylenol  Both help a little bit   Also gave urine sample  Results for orders placed or performed in visit on 04/22/18  POCT Urinalysis Dipstick (Automated)  Result Value Ref Range   Color, UA Light Yellow    Clarity, UA Clear    Glucose, UA Negative Negative   Bilirubin, UA Negative    Ketones, UA Negative    Spec Grav, UA 1.020 1.010 - 1.025   Blood, UA 25 Ery/uL    pH, UA 6.0 5.0 - 8.0   Protein, UA Negative Negative   Urobilinogen, UA 0.2 0.2 or 1.0 E.U./dL   Nitrite, UA Negative    Leukocytes, UA Negative Negative   some bladder heaviness  Has frequency/urgency baseline     For endometrial cancer-had total hysterectomy and SLN bx in April  Then vaginal brachytherapy  Had gyn/onc f/u recently - doing well   Patient Active Problem List   Diagnosis Date Noted  . Right groin pain 04/22/2018  . Right hip pain 04/22/2018  . Fall at home 04/22/2018  . Urinary frequency 04/22/2018  . Elevated random blood glucose level 11/11/2017  . Endometrial cancer ([place]) 09/12/2017  . Essential hypertension 08/29/2017  . Moderate aortic stenosis by prior echocardiogram 08/27/2017  . Heart murmur 07/23/2017  . Estrogen deficiency 11/05/2016  . Routine general medical examination at a health care facility 10/15/2015  . Encounter for Medicare annual wellness exam 09/30/2013  . Back pain 02/16/2013  . Hyperlipidemia, mild 09/02/2012  . Other screening mammogram 12/12/2011  . Degenerative disk disease 08/31/2011  . Hypokalemia 01/02/2011  . Personal history of colonic polyps  11/03/2010  . Constipation, slow transit 11/03/2010  . Benign neoplasm of colon 11/03/2010  . Melanosis coli 11/03/2010  . Gout 08/25/2010  . DERMATITIS, SEBORRHEIC 08/25/2010  . Osteopenia 09/05/2007  . History of poliomyelitis 08/26/2007  . Obesity 08/26/2007  . ANXIETY 08/26/2007  . DEPRESSION 08/26/2007  . ALLERGIC RHINITIS 08/26/2007  . GERD 08/26/2007  . IBS 08/26/2007  . OVERACTIVE BLADDER 08/26/2007  . OSTEOARTHRITIS, HANDS, BILATERAL 08/26/2007  . FIBROMYALGIA 08/26/2007  . EDEMA, CHRONIC 08/26/2007  . URINARY INCONTINENCE 08/26/2007  . HEMATURIA, HX OF 08/26/2007   Past Medical History:  Diagnosis Date  . Allergic rhinitis   . Arthritis    knees  . Depression   . Depression with anxiety   . Endometrial cancer [facility])    02/ 2019  Diagnosed with D&C/hysteroscopy with polypectomy  . Fibromyalgia    tx mobic  . Full dentures   . GERD (gastroesophageal reflux disease)   . Gout   . Hearing loss    wears bilateral hearing aids  . History of colon polyps   . History of nonmelanoma skin cancer    right lower leg  . Hypertension   . IBS (irritable bowel syndrome)    with constipation  . Moderate aortic stenosis by prior echocardiogram 07/30/2017---  cardiologist-- dr harding   Previously diagnosed with murmur  back in 2013. echo 07-30-2017 : Moderate AS with mild AR,  valve area 1.15cm^2,  Mean gradient 23 mmHg., peak gradient 60mmHg  . Osteopenia   . Polio age 31 months   right leg weakness and 1.5 inches shorter than left leg.   . Skin cancer 2019   Right leg  . Urgency of urination   . UTI (urinary tract infection) 01/2018  . Wears hearing aid in both ears    Past Surgical History:  Procedure Laterality Date  . ANKLE SURGERY Right 1956  . CARPAL TUNNEL RELEASE Bilateral 1986, 1993  . COLONOSCOPY N/A 06/24/2015   Procedure: COLONOSCOPY;  Surgeon: Gatha Mayer, MD;  Location: [facility];  Service: Endoscopy;  Laterality: N/A;  . DILATATION &  CURETTAGE/HYSTEROSCOPY WITH MYOSURE N/A 08/09/2017   Procedure: DILATATION & CURETTAGE/HYSTEROSCOPY WITH MYOSURE;  Surgeon: Christophe Louis, MD;  Location: [place];  Service: Gynecology;  Laterality: N/A;  Polypectomy  . DILATION AND CURETTAGE OF UTERUS     PMB  . HAND SURGERY Bilateral 1999   Thumbs   . KNEE SURGERY Bilateral 1998   x2  . LYMPH NODE BIOPSY N/A 10/08/2017   Procedure: LYMPH NODE BIOPSY;  Surgeon: [name], MD;  Location: [facility];  Service: Gynecology;  Laterality: N/A;  . MULTIPLE TOOTH EXTRACTIONS    . NASAL SINUS SURGERY  1976  . polio surgery.     right foot - right leg 1.5" shorter than left leg - some wekness  . ROBOTIC ASSISTED TOTAL HYSTERECTOMY WITH BILATERAL SALPINGO OOPHERECTOMY Bilateral 10/08/2017   Procedure: XI ROBOTIC ASSISTED TOTAL HYSTERECTOMY WITH BILATERAL SALPINGO OOPHORECTOMY;  Surgeon: [name], MD;  Location: [facility];  Service: Gynecology;  Laterality: Bilateral;  . THUMB ARTHROSCOPY  1999  . TONSILLECTOMY  10326   72 years old  . TRANSTHORACIC ECHOCARDIOGRAM  07/30/2017   mild LVH, ef 24-58%, grade 2 diastolic dysfunction/ moderately thickened and mild calcified AV leaflets with moderate stenosis & mild AR (valve area 1.15cm^2, mean gradiant 30mmHg, peak grandient 26mmHg)/ mild LAE and RAE/ trivial TR  . TUBAL LIGATION    . UPPER GI ENDOSCOPY     normal  . WISDOM TOOTH EXTRACTION     Social History   Tobacco Use  . Smoking status: Never Smoker  . Smokeless tobacco: Never Used  Substance Use Topics  . Alcohol use: No    Alcohol/week: 0.0 standard drinks  . Drug use: No   Family History  Problem Relation Age of Onset  . Kidney disease Mother   . Alzheimer's disease Mother   . Stroke Father 92  . Colon cancer Father 26  . Kidney disease Sister   . Heart attack Sister   . Cancer Paternal Aunt        COLON CA  . Colon cancer Paternal Aunt 60   Allergies  Allergen Reactions  . Clarithromycin Swelling    throat swells  . Codeine Nausea And  Vomiting  . Paroxetine Other (See Comments)    Ineffective.  Marland Kitchen Penicillins Hives    welts Has patient had a PCN reaction causing immediate rash, facial/tongue/throat swelling, SOB or lightheadedness with hypotension: No Has patient had a PCN reaction causing severe rash involving mucus membranes or skin necrosis: Unknown Has patient had a PCN reaction that required hospitalization:No Has patient had a PCN reaction occurring within the last 10 years: No If all of the above answers are "NO", then may proceed with Cephalosporin use.    Current Outpatient Medications on File  Prior to Visit  Medication Sig Dispense Refill  . acetaminophen (TYLENOL 8 HOUR ARTHRITIS PAIN) 650 MG CR tablet Take 650 mg by mouth daily as needed for pain.     Marland Kitchen acetic acid-hydrocortisone (ACETASOL HC) OTIC solution Place 2 drops into both ears 2 (two) times daily as needed (itching).    . Ascorbic Acid (VITAMIN C) 1000 MG tablet Take 1,000 mg by mouth daily with breakfast.     . aspirin 81 MG EC tablet Take 1 tablet (81 mg total) by mouth daily. DO NOT TAKE AGAIN UNTIL 07/11/2015 30 tablet 12  . Calcium-Vitamin D-Vitamin K (VIACTIV) 350-093-81 MG-UNT-MCG CHEW Chew 1 tablet by mouth 2 (two) times daily. Morning & afternoon    . cetirizine (ZYRTEC) 10 MG tablet Take 5 mg by mouth as needed for allergies (alternates between mucinex & zyrtec for sinus symptoms).     . cholecalciferol (VITAMIN D) 1000 UNITS tablet Take 1,000 Units by mouth daily with breakfast.     . esomeprazole (NEXIUM) 20 MG capsule Take 40 mg by mouth daily before breakfast.    . fluticasone (FLONASE) 50 MCG/ACT nasal spray Place 2 sprays into both nostrils at bedtime.     Marland Kitchen guaiFENesin (MUCINEX) 600 MG 12 hr tablet Take 600 mg by mouth daily.    . hydrochlorothiazide (HYDRODIURIL) 25 MG tablet Take 1 tablet (25 mg total) by mouth daily with lunch. 90 tablet 3  . lubiprostone (AMITIZA) 24 MCG capsule Take 1 capsule (24 mcg total) by mouth 2 (two) times  daily. LUNCH & SUPPER 180 capsule 3  . Multiple Vitamin (MULTIVITAMIN WITH MINERALS) TABS tablet Take 1 tablet by mouth daily. WOMEN'S MULTIVITAMIN    . Polyethyl Glycol-Propyl Glycol (SYSTANE) 0.4-0.3 % SOLN Place 1 drop into both eyes 2 (two) times daily.    . potassium chloride (K-DUR) 10 MEQ tablet Take 2 tablets (20 mEq total) by mouth 2 (two) times daily. 360 tablet 3  . sennosides-docusate sodium (SENOKOT-S) 8.6-50 MG tablet Take 1 tablet by mouth daily with supper.     . Simethicone (PHAZYME PO) Take 1 tablet by mouth 4 (four) times daily as needed (for gas (typically one tablet at night)).     . sodium chloride (AYR) 0.65 % nasal spray Place 1 spray into the nose 4 (four) times daily as needed for congestion.    . TURMERIC PO Take 1 tablet by mouth daily.    Marland Kitchen venlafaxine XR (EFFEXOR XR) 75 MG 24 hr capsule Take 1 capsule (75 mg total) by mouth daily after breakfast. 90 capsule 3   No current facility-administered medications on file prior to visit.      Review of Systems  Constitutional: Negative for activity change, appetite change, fatigue, fever and unexpected weight change.  HENT: Negative for congestion, ear pain, rhinorrhea, sinus pressure and sore throat.   Eyes: Negative for pain, redness and visual disturbance.  Respiratory: Negative for cough, shortness of breath and wheezing.   Cardiovascular: Negative for chest pain and palpitations.  Gastrointestinal: Negative for abdominal pain, blood in stool, constipation and diarrhea.  Endocrine: Negative for polydipsia and polyuria.  Genitourinary: Negative for dysuria, frequency and urgency.  Musculoskeletal: Positive for arthralgias, back pain and gait problem. Negative for joint swelling and myalgias.  Skin: Negative for pallor and rash.  Allergic/Immunologic: Negative for environmental allergies.  Neurological: Negative for dizziness, syncope and headaches.  Hematological: Negative for adenopathy. Does not bruise/bleed  easily.  Psychiatric/Behavioral: Negative for decreased concentration and dysphoric mood. The patient  is not nervous/anxious.        Objective:   Physical Exam  Constitutional: She is oriented to person, place, and time. She appears well-developed and well-nourished. No distress.  overwt and well appearing   HENT:  Head: Normocephalic and atraumatic.  Eyes: Pupils are equal, round, and reactive to light. Conjunctivae and EOM are normal.  Neck: Normal range of motion. Neck supple.  Nl rom   Cardiovascular: Normal rate and regular rhythm. Exam reveals gallop.  Pulmonary/Chest: Breath sounds normal. No respiratory distress. She has no wheezes.  Abdominal: Soft. Bowel sounds are normal. She exhibits no distension and no mass. There is no tenderness.  Mild suprapubic tenderness/no fullness No cva tenderness  Musculoskeletal: Normal range of motion. She exhibits tenderness. She exhibits no edema.  Leg length is shorter on R (lift on shoe)   Some groin pain on ext rotation of R hip  No crepitus  No abd/groin tenderness  Leg SLR for back or leg pain  Mild R trochanteric tenderness  No neuro changes      Lymphadenopathy:    She has no cervical adenopathy.  Neurological: She is alert and oriented to person, place, and time. She displays normal reflexes. No sensory deficit. She exhibits normal muscle tone. Coordination normal.  Skin: Skin is warm and dry. No rash noted. No erythema. No pallor.  Psychiatric: She has a normal mood and affect.          Assessment & Plan:   Problem List Items Addressed This Visit      Other   Fall at home    With R hip/groin pain but nl gait (baseline leg length discrepancy)  Disc safety/prev falls at home  No head injury       Right groin pain - Primary    R groin/hip pain -worse after fall  Xray today  May be muscular strain  Reassuring exam  Consider return to chiropractor  Continue shoe lift  Ice/heat Methocarbamol prn spasm /pain    ibuprofe 800 tid prn pain with food  Update if not starting to improve in a week or if worsening        Relevant Orders   DG HIP UNILAT W OR W/O PELVIS 2-3 VIEWS RIGHT   Right hip pain   Relevant Orders   DG HIP UNILAT W OR W/O PELVIS 2-3 VIEWS RIGHT   Urinary frequency    In pt with recent tx of endometrial cancer sm blood in urine Sent for cx No dysuria or other symptoms       Relevant Orders   Urine Culture    Other Visit Diagnoses    Pelvic pain       Relevant Orders   POCT Urinalysis Dipstick (Automated) (Completed)   Need for influenza vaccination       Relevant Orders   Flu vaccine HIGH DOSE PF (Fluzone High dose) (Completed)

## 2018-04-23 ENCOUNTER — Ambulatory Visit: Payer: PPO | Admitting: Orthotics

## 2018-04-23 ENCOUNTER — Ambulatory Visit (INDEPENDENT_AMBULATORY_CARE_PROVIDER_SITE_OTHER): Payer: PPO

## 2018-04-23 ENCOUNTER — Ambulatory Visit: Payer: PPO | Admitting: Family Medicine

## 2018-04-23 ENCOUNTER — Telehealth: Payer: Self-pay

## 2018-04-23 ENCOUNTER — Other Ambulatory Visit: Payer: Self-pay | Admitting: Podiatry

## 2018-04-23 ENCOUNTER — Ambulatory Visit: Payer: PPO | Admitting: Podiatry

## 2018-04-23 ENCOUNTER — Encounter: Payer: Self-pay | Admitting: Podiatry

## 2018-04-23 DIAGNOSIS — M25579 Pain in unspecified ankle and joints of unspecified foot: Secondary | ICD-10-CM

## 2018-04-23 DIAGNOSIS — M722 Plantar fascial fibromatosis: Secondary | ICD-10-CM

## 2018-04-23 DIAGNOSIS — M79671 Pain in right foot: Secondary | ICD-10-CM | POA: Diagnosis not present

## 2018-04-23 DIAGNOSIS — M79672 Pain in left foot: Secondary | ICD-10-CM

## 2018-04-23 DIAGNOSIS — M779 Enthesopathy, unspecified: Secondary | ICD-10-CM

## 2018-04-23 DIAGNOSIS — Q828 Other specified congenital malformations of skin: Secondary | ICD-10-CM

## 2018-04-23 LAB — URINE CULTURE
MICRO NUMBER:: 91238530
SPECIMEN QUALITY: ADEQUATE

## 2018-04-23 NOTE — Progress Notes (Signed)
Subjective:   Patient ID: Veronica Greene, female   DOB: 72 y.o.   MRN: [id number]   HPI Patient presents stating painful callus on the right heel and needs new orthotics as hers of worn out   ROS      Objective:  Physical Exam  Neurovascular status unchanged with thick keratotic lesion plantar aspect right heel that is moderately painful when palpated     Assessment:  Lesion secondary to pressure with high arch cavus foot structure     Plan:  Sharp sterile debridement of lesion accomplished and recommended new orthotics and work with ped orthotist to have new orthotics which were fabricated today

## 2018-04-23 NOTE — Telephone Encounter (Signed)
Released on mychart

## 2018-04-23 NOTE — Telephone Encounter (Signed)
Copied from [place] [id number]. Topic: General - Inquiry >> Apr 23, 2018  9:44 AM Conception Chancy, NT wrote: Reason for CRM: patient is wanting her xray results and urine results.

## 2018-04-29 ENCOUNTER — Other Ambulatory Visit: Payer: Self-pay | Admitting: Oncology

## 2018-04-29 ENCOUNTER — Ambulatory Visit: Payer: Self-pay

## 2018-04-29 ENCOUNTER — Telehealth: Payer: Self-pay | Admitting: Oncology

## 2018-04-29 DIAGNOSIS — R1031 Right lower quadrant pain: Secondary | ICD-10-CM

## 2018-04-29 DIAGNOSIS — C541 Malignant neoplasm of endometrium: Secondary | ICD-10-CM

## 2018-04-29 DIAGNOSIS — M25551 Pain in right hip: Secondary | ICD-10-CM

## 2018-04-29 NOTE — Telephone Encounter (Signed)
Next step would be a ref to orthopedics Please see if she is agreeable

## 2018-04-29 NOTE — Telephone Encounter (Signed)
Pt agrees with Ortho referral, pt has been to [place] and would like to be referred back there, she has a lot of appt already scheduled but she will let Rosaria Ferries or Azalee Course know what days work for her when they call her regarding the referral

## 2018-04-29 NOTE — Telephone Encounter (Signed)
Patient called in and says "I fell and had x-rays and it showed I have arthritis. I was prescribed Ibuprofen and a muscle relaxer. The Ibuprofen caused constipation and the muscle relaxer caused a headache. I haven't taken either of them since last week. I started taking Meloxicam and either ES Tylenol or Tylenol Arthritis. I am still having pain and my question is what do I do from here? Do I give it more time or start on Prednisone? I went to the Chiropractor and he didn't want to work on me too much to cause more pain and soreness, but what he did helped the left side. I don't know if I need to go back to him or what to do." I advised I will send this over to Dr. Glori Bickers and someone will call with her recommendation, she verbalized understanding and asks to be called on her cell phone [phone].   Reason for Disposition . Caller has NON-URGENT medication question about med that PCP prescribed and triager unable to answer question  Protocols used: MEDICATION QUESTION CALL-A-AH

## 2018-04-29 NOTE — Telephone Encounter (Signed)
Called Veronica Greene and notified her of [facility] result on her tumor pathology from 10/08/2017.  Asked if she would like genetic testing.  She said she would before she goes to [place] on 05/28/18.

## 2018-04-29 NOTE — Progress Notes (Signed)
.  Patient cast for new orthotics to address chronic callus pain left heel.  Plan on super soft accommodative from [facility], P-cell cover.   Dr. Paulla Dolly to drop charges.

## 2018-04-29 NOTE — Telephone Encounter (Signed)
Ref done Will route to [facility]  See phone note please

## 2018-04-30 ENCOUNTER — Encounter: Payer: Self-pay | Admitting: *Deleted

## 2018-04-30 ENCOUNTER — Telehealth: Payer: Self-pay | Admitting: Oncology

## 2018-04-30 NOTE — Telephone Encounter (Signed)
Called Veronica Greene and notified her of appointment for genetic counseling on 05/08/18 at 9 am.  She verbalized understanding and agreement.

## 2018-04-30 NOTE — Progress Notes (Signed)
Osvaldo Angst, CRNA  Teo Moede H, RN        Safia Panzer,   EF is 60 - 65 %, she is cleared for care at [facility]   Thanks,   John   Previous Messages    ----- Message -----  From: Levonne Spiller, RN  Sent: 04/28/2018 12:56 PM EDT  To: Osvaldo Angst, CRNA   Please review the 2019 echo. [place] for [employer]? Thank you,Marquise Lambson pv

## 2018-04-30 NOTE — Telephone Encounter (Signed)
Appt made with Dr Fredonia Highland and patient is aware.

## 2018-05-02 ENCOUNTER — Other Ambulatory Visit (INDEPENDENT_AMBULATORY_CARE_PROVIDER_SITE_OTHER): Payer: PPO

## 2018-05-02 DIAGNOSIS — R319 Hematuria, unspecified: Secondary | ICD-10-CM

## 2018-05-02 LAB — URINALYSIS, ROUTINE W REFLEX MICROSCOPIC
Bilirubin Urine: NEGATIVE
Hgb urine dipstick: NEGATIVE
Ketones, ur: NEGATIVE
Leukocytes, UA: NEGATIVE
Nitrite: NEGATIVE
PH: 7 (ref 5.0–8.0)
Total Protein, Urine: NEGATIVE
URINE GLUCOSE: NEGATIVE
Urobilinogen, UA: 0.2 (ref 0.0–1.0)

## 2018-05-05 DIAGNOSIS — M25551 Pain in right hip: Secondary | ICD-10-CM | POA: Diagnosis not present

## 2018-05-05 DIAGNOSIS — M545 Low back pain: Secondary | ICD-10-CM | POA: Diagnosis not present

## 2018-05-06 ENCOUNTER — Ambulatory Visit (AMBULATORY_SURGERY_CENTER): Payer: Self-pay | Admitting: *Deleted

## 2018-05-06 VITALS — Ht 65.0 in | Wt 183.0 lb

## 2018-05-06 DIAGNOSIS — Z86010 Personal history of colonic polyps: Secondary | ICD-10-CM

## 2018-05-06 MED ORDER — NA SULFATE-K SULFATE-MG SULF 17.5-3.13-1.6 GM/177ML PO SOLN: ORAL | 0 refills | Status: DC

## 2018-05-06 NOTE — Progress Notes (Signed)
Patient denies any allergies to eggs or soy. Patient denies any problems with anesthesia/sedation. Patient denies any oxygen use at home. Patient denies taking any diet/weight loss medications or blood thinners. EMMI education offered, pt declined. Spoke with Dr.Gessner today, explained that patient takes amitza and senokot, new prep instructions given to me for the pt. Also notified Dr.Gessner of pt's history of endometrial cancer in 2019, sx April and 5 treatments of radiation with last tx June 2019 and that her MD said she could have colon in the fall. Ok for colon at this time per Dr.Gessner.

## 2018-05-07 ENCOUNTER — Encounter

## 2018-05-07 ENCOUNTER — Ambulatory Visit
Admission: RE | Admit: 2018-05-07 | Discharge: 2018-05-07 | Disposition: A | Discharge status: Home / Self Care | Payer: PPO | Source: Ambulatory Visit | Attending: Family Medicine | Admitting: Family Medicine

## 2018-05-07 ENCOUNTER — Ambulatory Visit: Payer: PPO | Admitting: Orthotics

## 2018-05-07 ENCOUNTER — Encounter: Payer: Self-pay | Admitting: Internal Medicine

## 2018-05-07 DIAGNOSIS — Q828 Other specified congenital malformations of skin: Secondary | ICD-10-CM

## 2018-05-07 DIAGNOSIS — M722 Plantar fascial fibromatosis: Secondary | ICD-10-CM

## 2018-05-07 DIAGNOSIS — Z1231 Encounter for screening mammogram for malignant neoplasm of breast: Secondary | ICD-10-CM | POA: Diagnosis not present

## 2018-05-07 DIAGNOSIS — M25579 Pain in unspecified ankle and joints of unspecified foot: Secondary | ICD-10-CM

## 2018-05-07 DIAGNOSIS — M79672 Pain in left foot: Secondary | ICD-10-CM

## 2018-05-07 NOTE — Progress Notes (Signed)
Redo f/o just as original ones.  These that she was supposed to pick up today she could get to fit into her shoe (R)

## 2018-05-08 ENCOUNTER — Encounter: Payer: Self-pay | Admitting: Licensed Clinical Social Worker

## 2018-05-08 ENCOUNTER — Inpatient Hospital Stay (HOSPITAL_BASED_OUTPATIENT_CLINIC_OR_DEPARTMENT_OTHER): Payer: PPO | Admitting: Licensed Clinical Social Worker

## 2018-05-08 ENCOUNTER — Inpatient Hospital Stay: Payer: PPO

## 2018-05-08 DIAGNOSIS — Z8371 Family history of colonic polyps: Secondary | ICD-10-CM

## 2018-05-08 DIAGNOSIS — C541 Malignant neoplasm of endometrium: Secondary | ICD-10-CM

## 2018-05-08 DIAGNOSIS — Z800 Family history of malignant neoplasm of digestive organs: Secondary | ICD-10-CM

## 2018-05-08 NOTE — Progress Notes (Signed)
REFERRING PROVIDER: [name], MD [place] [address], [place] [address]  PRIMARY PROVIDER:  Abner Greenspan, MD  PRIMARY REASON FOR VISIT:  1. Endometrial cancer ([place])   2. Family history of colon cancer   3. Family history of colonic polyps      HISTORY OF PRESENT ILLNESS:   [name], a 72 y.o. female, was seen for a  cancer genetics consultation at the request of Dr. Denman George due to a personal and family history of cancer.  Veronica Greene presents to clinic today to discuss the possibility of a hereditary predisposition to cancer, genetic testing, and to further clarify her future cancer risks, as well as potential cancer risks for family members.   In 2019, at the age of 50, Veronica Greene was diagnosed with endometrial cancer. She had surgery (TAH BSO) in April. Her tumor was found to be MSI-High, with normal IHC.   HORMONAL RISK FACTORS:  Menarche was at age 47.  First live birth at age 57.  OCP use for approximately 10+ years.  Ovaries intact: no.  Hysterectomy: yes.  Menopausal status: postmenopausal.  HRT use: 0 years. Colonoscopy: yes; polyps on cscope, cumulative 5+. Mammogram within the last year: yes. Number of breast biopsies: 0  Past Medical History:  Diagnosis Date  . Allergic rhinitis   . Arthritis    knees  . Depression   . Depression with anxiety   . Endometrial cancer [facility])    02/ 2019  Diagnosed with D&C/hysteroscopy with polypectomy  . Family history of colon cancer   . Family history of colonic polyps   . Fibromyalgia    tx mobic  . Full dentures   . GERD (gastroesophageal reflux disease)   . Gout   . Hearing loss    wears bilateral hearing aids  . Heart murmur   . History of colon polyps   . History of nonmelanoma skin cancer    right lower leg  . Hypertension   . IBS (irritable bowel syndrome)    with constipation  . Moderate aortic stenosis by prior echocardiogram 07/30/2017---  cardiologist-- dr harding   Previously  diagnosed with murmur back in 2013. echo 07-30-2017 : Moderate AS with mild AR,  valve area 1.15cm^2,  Mean gradient 23 mmHg., peak gradient 29mHg  . Osteopenia   . Polio age 470 months  right leg weakness and 1.5 inches shorter than left leg.   . Skin cancer 2019   Right leg  . Urgency of urination   . UTI (urinary tract infection) 01/2018  . Wears hearing aid in both ears     Past Surgical History:  Procedure Laterality Date  . ANKLE SURGERY Right 1956  . CARPAL TUNNEL RELEASE Bilateral 1986, 1993  . COLONOSCOPY N/A 06/24/2015   Procedure: COLONOSCOPY;  Surgeon: CGatha Mayer MD;  Location: [facility];  Service: Endoscopy;  Laterality: N/A;  . DILATATION & CURETTAGE/HYSTEROSCOPY WITH MYOSURE N/A 08/09/2017   Procedure: DILATATION & CURETTAGE/HYSTEROSCOPY WITH MYOSURE;  Surgeon: CChristophe Louis MD;  [facility];  Service: Gynecology;  Laterality: N/A;  Polypectomy  . DILATION AND CURETTAGE OF UTERUS     PMB  . HAND SURGERY Bilateral 1999   Thumbs   . KNEE SURGERY Bilateral 1998   x2  . LYMPH NODE BIOPSY N/A 10/08/2017   Procedure: LYMPH NODE BIOPSY;  Surgeon: REveritt Amber MD;  Location: [facility];  Service: Gynecology;  Laterality: N/A;  . MULTIPLE TOOTH EXTRACTIONS    . NASAL  SINUS SURGERY  1976  . polio surgery.     right foot - right leg 1.5" shorter than left leg - some wekness  . ROBOTIC ASSISTED TOTAL HYSTERECTOMY WITH BILATERAL SALPINGO OOPHERECTOMY Bilateral 10/08/2017   Procedure: XI ROBOTIC ASSISTED TOTAL HYSTERECTOMY WITH BILATERAL SALPINGO OOPHORECTOMY;  Surgeon: [name], MD;  Location: [facility];  Service: Gynecology;  Laterality: Bilateral;  . THUMB ARTHROSCOPY  1999  . TONSILLECTOMY  3639   72 years old  . TRANSTHORACIC ECHOCARDIOGRAM  07/30/2017   mild LVH, ef 44-96%, grade 2 diastolic dysfunction/ moderately thickened and mild calcified AV leaflets with moderate stenosis & mild AR (valve area 1.15cm^2, mean gradiant 1mHg, peak grandient 414mg)/ mild LAE and RAE/  trivial TR  . TUBAL LIGATION    . UPPER GI ENDOSCOPY     normal  . WISDOM TOOTH EXTRACTION      Social History   Socioeconomic History  . Marital status: Married    Spouse name: Not on file  . Number of children: 2  . Years of education: Not on file  . Highest education level: High school graduate  Occupational History  . Occupation: RECEPTION    Employer: [employer]  Social Needs  . Financial resource strain: Not on file  . Food insecurity:    Worry: Not on file    Inability: Not on file  . Transportation needs:    Medical: Not on file    Non-medical: Not on file  Tobacco Use  . Smoking status: Never Smoker  . Smokeless tobacco: Never Used  Substance and Sexual Activity  . Alcohol use: No    Alcohol/week: 0.0 standard drinks  . Drug use: No  . Sexual activity: Yes    Birth control/protection: Post-menopausal  Lifestyle  . Physical activity:    Days per week: Not on file    Minutes per session: Not on file  . Stress: Not on file  Relationships  . Social connections:    Talks on phone: Not on file    Gets together: Not on file    Attends religious service: Not on file    Active member of club or organization: Not on file    Attends meetings of clubs or organizations: Not on file    Relationship status: Not on file  Other Topics Concern  . Not on file  Social History Narrative   She is relatively recently remarried.  She has 2  children and 3 grandchildren.     She is currently retired.  But enjoys cleaning house and doing chores.  She likes to do yard work.  Does not routinely exercise.     FAMILY HISTORY:  We obtained a detailed, 4-generation family history.  Significant diagnoses are listed below: Family History  Problem Relation Age of Onset  . Kidney disease Mother   . Alzheimer's disease Mother   . Stroke Father 7956. Colon cancer Father 704     possibly colon cancer, possibly just polyps  . Kidney  disease Sister   . Heart attack Sister   . Colon cancer Paternal Aunt 7049. Cancer Paternal Uncle        unk type    Veronica Greene two sons, one passed away at age 2463Her other son is 89ith no cancer history, and two sons and a daughter. Ms. KiStokerad one sister who died at 6864f a heart attack. She had  two sons.   Ms. Salas mother died of Alzheimer's. She had 5 brothers, no cancers. No cancer history for Ms. Altieri maternal cousins. The patient's maternal grandmother died in her 66's, and possibly had uterine cancer. The maternal grandfather also died in his 59's.   Ms. Hickle father died at 53, he possibly had colon cancer. He did have colon polyps. He had 4 brothers and 3 sisters. Ms. Linck had at least one paternal aunt that had colon cancer dx 20s, and possibly another. An uncle had cancer, unknown type. A paternal cousin had colon cancer diagnosed at 49 and died at 47. Both paternal grandparents died when they were "old."  Ms. Shere is unaware of previous family history of genetic testing for hereditary cancer risks. Patient's maternal ancestors are of Caucasian descent, and paternal ancestors are of Caucasian descent. There is no reported Ashkenazi Jewish ancestry. There is no known consanguinity.  GENETIC COUNSELING ASSESSMENT: Adalis Gatti is a 72 y.o. female with a personal history of endometrial cancer and family history of colon cancer which is somewhat suggestive of a Hereditary Cancer Predisposition Syndrome. We, therefore, discussed and recommended the following at today's visit.   DISCUSSION: We discussed that 5-10% of cancer is hereditary. We also discussed Lynch Syndrome, also called HNPCC (Hereditary Non-Polyposis Colon Cancer).  This syndrome increases the risk for colon, uterine, ovarian and stomach cancers, brain cancers, as well as others.  Families with Lynch Syndrome tend to have multiple family members with these cancers, typically diagnosed  under age 11, and diagnoses in multiple generations. The genes that are known to cause Lynch Syndrome are called MLH1, MSH2, MSH6, PMS2 and EPCAM.  We discussed that there are several other genes that are associated with an increased risk for colon cancer, endometrial cancer, and increased polyp burden. We also dicussed that there are many genes that cause many different types of cancer risks.    We reviewed the characteristics, features and inheritance patterns of hereditary cancer syndromes. We also discussed genetic testing, including the appropriate family members to test, the process of testing, insurance coverage and turn-around-time for results. We discussed the implications of a negative, positive and/or variant of uncertain significant result. We recommended Ms. Zappia pursue genetic testing for the [facility] CancerNext gene panel with TumorNext-[facility].  The CancerNext gene panel offered by [facility] includes sequencing and rearrangement analysis for the following 32 genes:   APC, ATM, BARD1, BMPR1A, BRCA1, BRCA2, BRIP1, CDH1, CDK4, CDKN2A, CHEK2, DICER1, EPCAM, GREM1, HOXB13MLH1, MRE11A, MSH2, MSH6, MUTYH, NBN, NF1, PALB2, PMS2, POLD1, POLE, PTEN, RAD50, RAD51D, SMAD4, SMARCA4, STK11, and TP53.   We discussed that genetic testing through [facility] will test for hereditary mutations that could explain her diagnosis of cancer. However, TumorNext Lynch can test the endometrial cancer tumor to determine if there is a Lynch syndrome mutation that could be undetected, or more clearly determine that her cancer is sporadic.  TumorNext Lynch testing is performed in tandem with genetic testing, and typically at no additional cost.     We discussed that if she is found to have a mutation in one of these genes, it may impact future medical management recommendations such as increased cancer screenings and consideration of risk reducing surgeries.  A positive result could also have implications for the  patient's family members.  A Negative result would mean we were unable to identify a hereditary component to her cancer, but does not rule out the possibility of a hereditary basis for her endometrial cancer.  There could be mutations that are undetectable by current technology, or in genes not yet tested or identified to increase cancer risk.    We discussed the potential to find a Variant of Uncertain Significance or VUS.  These are variants that have not yet been identified as pathogenic or benign, and it is unknown if this variant is associated with increased cancer risk or if this is a normal finding.  Most VUS's are reclassified to benign or likely benign.   It should not be used to make medical management decisions. With time, we suspect the lab will determine the significance of any VUS's identified if any.   Based on Ms. Gavidia personal and family history of cancer, she meets NCCN medical criteria for genetic testing. Despite that she meets criteria, she may still have an out of pocket cost. The lab will discuss her OOP with her.    PLAN: After considering the risks, benefits, and limitations, Ms. Fross  provided informed consent to pursue genetic testing and the blood sample was sent to [facility] for analysis of the CancerNext + TumorNext-Lynch panel. Results should be available within approximately 2-3 weeks' time, at which point they will be disclosed by telephone to Ms. Volante, as will any additional recommendations warranted by these results. Ms. Goodson will receive a summary of her genetic counseling visit and a copy of her results once available. This information will also be available in Epic.   Lastly, we encouraged Ms. Woolford to remain in contact with cancer genetics annually so that we can continuously update the family history and inform her of any changes in cancer genetics and testing that may be of benefit for this family.   Ms.  Barritt questions were answered  to her satisfaction today. Our contact information was provided should additional questions or concerns arise. Thank you for the referral and allowing [place] to share in the care of your patient.   Faith Rogue, MS Genetic Counselor [email or web address] Phone: [phone]  The patient was seen for a total of 35 minutes in face-to-face genetic counseling.  The patient was accompanied today by her husband, Eduard Clos.

## 2018-05-09 ENCOUNTER — Telehealth: Payer: Self-pay | Admitting: Family Medicine

## 2018-05-09 NOTE — Telephone Encounter (Signed)
Pt didn't have computer access so she wasn't able to see her mychart results so I did advise pt

## 2018-05-09 NOTE — Telephone Encounter (Signed)
Pt want results of her urine test she had done last Friday. Please advise pt

## 2018-05-09 NOTE — Telephone Encounter (Signed)
It was normal/ clear

## 2018-05-12 DIAGNOSIS — L578 Other skin changes due to chronic exposure to nonionizing radiation: Secondary | ICD-10-CM | POA: Diagnosis not present

## 2018-05-12 DIAGNOSIS — D0471 Carcinoma in situ of skin of right lower limb, including hip: Secondary | ICD-10-CM | POA: Diagnosis not present

## 2018-05-12 DIAGNOSIS — L570 Actinic keratosis: Secondary | ICD-10-CM | POA: Diagnosis not present

## 2018-05-13 DIAGNOSIS — Z800 Family history of malignant neoplasm of digestive organs: Secondary | ICD-10-CM | POA: Diagnosis not present

## 2018-05-13 DIAGNOSIS — C541 Malignant neoplasm of endometrium: Secondary | ICD-10-CM | POA: Diagnosis not present

## 2018-05-13 DIAGNOSIS — Z8371 Family history of colonic polyps: Secondary | ICD-10-CM | POA: Diagnosis not present

## 2018-05-20 ENCOUNTER — Ambulatory Visit (AMBULATORY_SURGERY_CENTER): Payer: PPO | Admitting: Internal Medicine

## 2018-05-20 ENCOUNTER — Encounter: Payer: Self-pay | Admitting: Internal Medicine

## 2018-05-20 VITALS — BP 136/63 | HR 68 | Temp 98.4°F | Resp 15 | Ht 65.0 in | Wt 183.0 lb

## 2018-05-20 DIAGNOSIS — D120 Benign neoplasm of cecum: Secondary | ICD-10-CM

## 2018-05-20 DIAGNOSIS — D122 Benign neoplasm of ascending colon: Secondary | ICD-10-CM

## 2018-05-20 DIAGNOSIS — D124 Benign neoplasm of descending colon: Secondary | ICD-10-CM | POA: Diagnosis not present

## 2018-05-20 DIAGNOSIS — D125 Benign neoplasm of sigmoid colon: Secondary | ICD-10-CM

## 2018-05-20 DIAGNOSIS — Z86010 Personal history of colonic polyps: Secondary | ICD-10-CM

## 2018-05-20 DIAGNOSIS — D123 Benign neoplasm of transverse colon: Secondary | ICD-10-CM | POA: Diagnosis not present

## 2018-05-20 MED ORDER — SODIUM CHLORIDE 0.9 % IV SOLN: 500.0000 mL | Freq: Once | INTRAVENOUS | Status: DC

## 2018-05-20 NOTE — Patient Instructions (Addendum)
I removed 13 small polyps. I will let you know pathology results and when to have another routine colonoscopy by mail and/or My Chart.  You probably need an office visit to discuss constipation problems. I'm not sure I can find something cheaper but we can see.  I appreciate the opportunity to care for you. Gatha Mayer, MD, FACG  YOU HAD AN ENDOSCOPIC PROCEDURE TODAY AT [facility]:   Refer to the procedure report that was given to you for any specific questions about what was found during the examination.  If the procedure report does not answer your questions, please call your gastroenterologist to clarify.  If you requested that your care partner not be given the details of your procedure findings, then the procedure report has been included in a sealed envelope for you to review at your convenience later.  YOU SHOULD EXPECT: Some feelings of bloating in the abdomen. Passage of more gas than usual.  Walking can help get rid of the air that was put into your GI tract during the procedure and reduce the bloating. If you had a lower endoscopy (such as a colonoscopy or flexible sigmoidoscopy) you may notice spotting of blood in your stool or on the toilet paper. If you underwent a bowel prep for your procedure, you may not have a normal bowel movement for a few days.  Please Note:  You might notice some irritation and congestion in your nose or some drainage.  This is from the oxygen used during your procedure.  There is no need for concern and it should clear up in a day or so.  SYMPTOMS TO REPORT IMMEDIATELY:   Following lower endoscopy (colonoscopy or flexible sigmoidoscopy):  Excessive amounts of blood in the stool  Significant tenderness or worsening of abdominal pains  Swelling of the abdomen that is new, acute  Fever of 100F or higher   Following upper endoscopy (EGD)  Vomiting of blood or coffee ground material  New chest pain or pain under the  shoulder blades  Painful or persistently difficult swallowing  New shortness of breath  Fever of 100F or higher  Black, tarry-looking stools  For urgent or emergent issues, a gastroenterologist can be reached at any hour by calling [phone].   DIET:  We do recommend a small meal at first, but then you may proceed to your regular diet.  Drink plenty of fluids but you should avoid alcoholic beverages for 24 hours.  MEDICATIONS: Continue present medications.  Please see handouts given to you by your recovery nurse.  ACTIVITY:  You should plan to take it easy for the rest of today and you should NOT DRIVE or use heavy machinery until tomorrow (because of the sedation medicines used during the test).    FOLLOW UP: Our staff will call the number listed on your records the next business day following your procedure to check on you and address any questions or concerns that you may have regarding the information given to you following your procedure. If we do not reach you, we will leave a message.  However, if you are feeling well and you are not experiencing any problems, there is no need to return our call.  We will assume that you have returned to your regular daily activities without incident.  If any biopsies were taken you will be contacted by phone or by letter within the next 1-3 weeks.  Please call us at [phone] if you have  not heard about the biopsies in 3 weeks.   Thank you for allowing [place] to provide for your healthcare needs today.  SIGNATURES/CONFIDENTIALITY: You and/or your care partner have signed paperwork which will be entered into your electronic medical record.  These signatures attest to the fact that that the information above on your After Visit Summary has been reviewed and is understood.  Full responsibility of the confidentiality of this discharge information lies with you and/or your care-partner.

## 2018-05-20 NOTE — Progress Notes (Signed)
Called to room to assist during endoscopic procedure.  Patient ID and intended procedure confirmed with present staff. Received instructions for my participation in the procedure from the performing physician.  

## 2018-05-20 NOTE — Progress Notes (Signed)
Pt's states no medical or surgical changes since previsit or office visit. 

## 2018-05-20 NOTE — Op Note (Addendum)
[facility] Patient Name: Veronica Greene Procedure Date: 05/20/2018 2:20 PM MRN: [id number] Endoscopist: Gatha Mayer , MD Age: 72 Referring MD:  Date of Birth: [birth year] Gender: Female Account #: [id number] Procedure:                Colonoscopy Indications:              Last colonoscopy: 2016 Medicines:                Propofol per Anesthesia, Monitored Anesthesia Care Procedure:                Pre-Anesthesia Assessment:                           - Prior to the procedure, a History and Physical                            was performed, and patient medications and                            allergies were reviewed. The patient's tolerance of                            previous anesthesia was also reviewed. The risks                            and benefits of the procedure and the sedation                            options and risks were discussed with the patient.                            All questions were answered, and informed consent                            was obtained. Prior Anticoagulants: The patient has                            taken no previous anticoagulant or antiplatelet                            agents. ASA Grade Assessment: III - A patient with                            severe systemic disease. After reviewing the risks                            and benefits, the patient was deemed in                            satisfactory condition to undergo the procedure.                           After obtaining informed consent, the colonoscope  was passed under direct vision. Throughout the                            procedure, the patient's blood pressure, pulse, and                            oxygen saturations were monitored continuously. The                            Model PCF-[id number]) scope was introduced                            through the anus and advanced to the the cecum,                            identified  by appendiceal orifice and ileocecal                            valve. The colonoscopy was somewhat difficult due                            to significant looping. Successful completion of                            the procedure was aided by withdrawing and                            reinserting the scope. The patient tolerated the                            procedure well. The quality of the bowel                            preparation was excellent. The ileocecal valve,                            appendiceal orifice, and rectum were photographed.                            The bowel preparation used was Miralax. Scope In: 2:23:33 PM Scope Out: 2:51:36 PM Scope Withdrawal Time: 0 hours 20 minutes 43 seconds  Total Procedure Duration: 0 hours 28 minutes 3 seconds  Findings:                 The perianal and digital rectal examinations were                            normal.                           Multiple sessile polyps were found in the sigmoid                            colon, descending colon and transverse colon. The  polyps were diminutive in size. These polyps were                            removed with a cold snare. Resection and retrieval                            were complete. Verification of patient                            identification for the specimen was done. Estimated                            blood loss was minimal.                           Multiple sessile polyps were found in the                            transverse colon, ascending colon and cecum. The                            polyps were diminutive in size. These polyps were                            removed with a cold biopsy forceps. Resection and                            retrieval were complete. Verification of patient                            identification for the specimen was done. Estimated                            blood loss was minimal.                            The exam was otherwise without abnormality on                            direct and retroflexion views. Complications:            No immediate complications. Estimated Blood Loss:     Estimated blood loss was minimal. Impression:               - Multiple diminutive polyps in the sigmoid colon,                            in the descending colon and in the transverse                            colon, removed with a cold snare. Resected and                            retrieved.                           -  Multiple diminutive polyps in the transverse                            colon, in the ascending colon and in the cecum,                            removed with a cold biopsy forceps. Resected and                            retrieved.                           13 total polyps removed                           - The examination was otherwise normal on direct                            and retroflexion views. Except ascending colon scar                            and tattoos from prior polypectomy                           - Personal history of colonic polyps. Recommendation:           - Patient has a contact number available for                            emergencies. The signs and symptoms of potential                            delayed complications were discussed with the                            patient. Return to normal activities tomorrow.                            Written discharge instructions were provided to the                            patient.                           - Resume previous diet.                           - Continue present medications.                           - Repeat colonoscopy is recommended for                            surveillance. The colonoscopy date will be  determined after pathology results from today's                            exam become available for review.                           Extra prep w/ Sennokot and 4  MiraLax day before                            prep day was used and would do again.                           - Await Lynch testing results also Gatha Mayer, MD 05/20/2018 3:13:03 PM This report has been signed electronically.

## 2018-05-20 NOTE — Progress Notes (Signed)
PT taken to PACU. Monitors in place. VSS. Report given to RN. 

## 2018-05-21 ENCOUNTER — Telehealth: Payer: Self-pay

## 2018-05-21 NOTE — Telephone Encounter (Signed)
  Follow up Call-  Call back number 05/20/2018  Post procedure Call Back phone  # [phone]  Permission to leave phone message Yes  Some recent data might be hidden     Patient questions:  Do you have a fever, pain , or abdominal swelling? No. Pain Score  0 *  Have you tolerated food without any problems? Yes.    Have you been able to return to your normal activities? Yes.    Do you have any questions about your discharge instructions: Diet   No. Medications  No. Follow up visit  No.  Do you have questions or concerns about your Care? No.  Actions: * If pain score is 4 or above: No action needed, pain <4.

## 2018-05-22 ENCOUNTER — Encounter: Payer: PPO | Admitting: Orthotics

## 2018-05-26 ENCOUNTER — Ambulatory Visit: Payer: PPO | Admitting: Orthotics

## 2018-05-26 DIAGNOSIS — M722 Plantar fascial fibromatosis: Secondary | ICD-10-CM

## 2018-05-26 DIAGNOSIS — Q828 Other specified congenital malformations of skin: Secondary | ICD-10-CM

## 2018-05-26 DIAGNOSIS — M79672 Pain in left foot: Secondary | ICD-10-CM

## 2018-05-26 NOTE — Progress Notes (Signed)
Patient came in today to p/up functional foot orthotics.   The orthotics were assessed to both fit and function.  The F/O addressed the biomechanical issues/pathologies as intended, offering good longitudinal arch support, proper offloading, and foot support. There weren't any signs of discomfort or irritation.  The F/O fit properly in footwear with minimal trimming/adjustments. 

## 2018-05-28 ENCOUNTER — Encounter: Payer: Self-pay | Admitting: Internal Medicine

## 2018-05-28 NOTE — Progress Notes (Signed)
Multiple adenomas and ssp's Recall 2020

## 2018-06-02 ENCOUNTER — Telehealth: Payer: Self-pay | Admitting: Internal Medicine

## 2018-06-02 NOTE — Telephone Encounter (Signed)
Pt. Called in wanting lab results from procedure on 05/20/2018

## 2018-06-02 NOTE — Telephone Encounter (Signed)
Spoke with pt and reviewed path letter with pt and she is aware.  

## 2018-06-11 ENCOUNTER — Telehealth: Payer: Self-pay | Admitting: Licensed Clinical Social Worker

## 2018-06-11 NOTE — Telephone Encounter (Signed)
Received a call from patient asking when her results will be ready, and if she needed to sign a form that the lab asked her to sign. I let her know she was not required to sign this form in order for testing to proceed and that I got an update on her testing today that it should be reporting within the week.

## 2018-06-23 ENCOUNTER — Telehealth: Payer: Self-pay | Admitting: Licensed Clinical Social Worker

## 2018-06-23 ENCOUNTER — Encounter: Payer: Self-pay | Admitting: Licensed Clinical Social Worker

## 2018-06-23 DIAGNOSIS — Z1379 Encounter for other screening for genetic and chromosomal anomalies: Secondary | ICD-10-CM | POA: Insufficient documentation

## 2018-06-23 NOTE — Telephone Encounter (Signed)
Revealed negative germline and somatic testing. This normal result is reassuring and indicates that it is unlikely Veronica Greene cancer is due to a hereditary cause.  It is unlikely that there is an increased risk of another cancer due to a mutation in one of these genes.  However, genetic testing is not perfect, and cannot definitively rule out a hereditary cause.  It will be important for her to keep in contact with genetics to learn if any additional testing may be needed in the future.

## 2018-06-24 ENCOUNTER — Encounter: Payer: Self-pay | Admitting: Licensed Clinical Social Worker

## 2018-06-24 ENCOUNTER — Ambulatory Visit: Payer: Self-pay | Admitting: Licensed Clinical Social Worker

## 2018-06-24 DIAGNOSIS — Z1379 Encounter for other screening for genetic and chromosomal anomalies: Secondary | ICD-10-CM

## 2018-06-24 NOTE — Progress Notes (Signed)
HPI:  Veronica Greene was previously seen in the [place] clinic on 05/08/2018 due to a personal and family history of cancer and concerns regarding a hereditary predisposition to cancer. Please refer to our prior cancer genetics clinic note for more information regarding Veronica Greene medical, social and family histories, and our assessment and recommendations, at the time. Veronica Greene recent genetic test results were disclosed to her, as well as recommendations warranted by these results. These results and recommendations are discussed in more detail below.  In 2019, at the age of 72, Veronica Greene was diagnosed with endometrial cancer. She had surgery (TAH BSO) in April. Her tumor was found to be MSI-High, with normal IHC.   CANCER HISTORY:   No history exists.     FAMILY HISTORY:  We obtained a detailed, 4-generation family history.  Significant diagnoses are listed below: Family History  Problem Relation Age of Onset  . Kidney disease Mother   . Alzheimer's disease Mother   . Stroke Father 50  . Colon cancer Father 33       possibly colon cancer, possibly just polyps  . Kidney disease Sister   . Heart attack Sister   . Colon cancer Paternal Aunt 37  . Cancer Paternal Uncle        unk type  . Esophageal cancer Neg Hx   . Stomach cancer Neg Hx    Veronica Greene had two sons, one passed away at age 55. Her other son is 23 with no cancer history, and two sons and a daughter. Veronica Greene had one sister who died at 39 of a heart attack. She had two sons.   Veronica Greene mother died of Alzheimer's. She had 5 brothers, no cancers. No cancer history for Veronica Greene maternal cousins. The patient's maternal grandmother died in her 63's, and possibly had uterine cancer. The maternal grandfather also died in his 48's.   Veronica Greene father died at 27, he possibly had colon cancer. He did have colon polyps. He had 4 brothers and 3 sisters. Veronica Greene had at least one paternal  aunt that had colon cancer dx 46s, and possibly another. An uncle had cancer, unknown type. A paternal cousin had colon cancer diagnosed at 46 and died at 34. Both paternal grandparents died when they were "old."  Veronica Greene is unaware of previous family history of genetic testing for hereditary cancer risks. Patient's maternal ancestors are of Caucasian descent, and paternal ancestors are of Caucasian descent. There is no reported Ashkenazi Jewish ancestry. There is no known consanguinity.  GENETIC TEST RESULTS: Genetic testing performed through [facility] with [facility] reported out on 06/23/2018 showed no pathogenic mutations. The CancerNext gene panel offered by [facility] includes sequencing and rearrangement analysis for the following 32 genes:   APC, ATM, BARD1, BMPR1A, BRCA1, BRCA2, BRIP1, CDH1, CDK4, CDKN2A, CHEK2, DICER1, EPCAM, GREM1, HOXB13MLH1, MRE11A, MSH2, MSH6, MUTYH, NBN, NF1, PALB2, PMS2, POLD1, POLE, PTEN, RAD50, RAD51D, SMAD4, SMARCA4, STK11, and TP53.  The [facility] somatic genetic testing offered by [facility] includes MLH1, MSH2, MSH6, PMS2, and EPCAM..  The test report will be scanned into EPIC and will be located under the Molecular Pathology section of the Results Review tab. A portion of the result report is included below for reference.     We discussed with Veronica Greene that because current genetic testing is not perfect, it is possible there may be a gene mutation in one of these genes that current testing cannot  detect, but that chance is small.  We also discussed, that there could be another gene that has not yet been discovered, or that we have not yet tested, that is responsible for the cancer diagnoses in the family. It is also possible there is a hereditary cause for the cancer in the family that Veronica Greene did not inherit and therefore was not identified in her testing.  Therefore, it is important to remain in touch with cancer genetics in the  future so that we can continue to offer Veronica Greene the most up to date genetic testing.   ADDITIONAL GENETIC TESTING: We discussed with Veronica Greene that her genetic testing was fairly extensive.  If there are are genes identified to increase cancer risk that can be analyzed in the future, we would be happy to discuss and coordinate this testing at that time.    CANCER SCREENING RECOMMENDATIONS: Veronica Greene test result is considered negative (normal).  This means that we have not identified a hereditary cause for her personal and family history of cancer at this time.   This normal indicates that it is unlikely Veronica Greene has an increased risk of cancer due to a mutation in one of these genes.   This normal test also suggests that Veronica Greene cancer was most likely not due to an inherited predisposition associated with one of these genes.  Most cancers happen by chance and this negative test suggests that her cancer may fall into this category.While reassuring, this does not definitively rule out a hereditary predisposition to cancer. It is still possible that there could be genetic mutations that are undetectable by current technology, or genetic mutations in genes that have not been tested or identified to increase cancer risk.  Therefore, it is recommended she continue to follow the cancer management and screening guidelines provided by her oncology and primary healthcare provider. An individual's cancer risk is not determined by genetic test results alone.  Overall cancer risk assessment includes additional factors such as personal medical history, family history, etc.  These should be used to make a personalized plan for cancer prevention and surveillance.    RECOMMENDATIONS FOR FAMILY MEMBERS:  Relatives in this family might be at some increased risk of developing cancer, over the general population risk, simply due to the family history of cancer.  We recommended women in this family have a  yearly mammogram beginning at age 20, or 44 years younger than the earliest onset of cancer, an annual clinical breast exam, and perform monthly breast self-exams. Women in this family should also have a gynecological exam as recommended by their primary provider. All family members should have a colonoscopy as directed by their doctors.  All family members should inform their physicians about the family history of cancer so their doctors can make the most appropriate screening recommendations for them.   It is also possible there is a hereditary cause for the cancer in Ms. Sabey family that she did not inherit and therefore was not identified in her.  It is possible her paternal relatives could benefit from genetic counseling and testing, especially if her uncle's colon cancer diagnosis is confirmed. Ms. Kang will let us know if we can be of any assistance in coordinating genetic counseling and/or testing for these family members.   FOLLOW-UP: Lastly, we discussed with Ms. Mcgivern that cancer genetics is a rapidly advancing field and it is possible that new genetic tests will be appropriate for her and/or her family members in  the future. We encouraged her to remain in contact with cancer genetics on an annual basis so we can update her personal and family histories and let her know of advances in cancer genetics that may benefit this family.   Our contact number was provided. Ms. Rivere questions were answered to her satisfaction, and she knows she is welcome to call us at anytime with additional questions or concerns.  Faith Rogue, MS Genetic Counselor [email or web address] Phone: [phone]

## 2018-07-03 ENCOUNTER — Encounter (HOSPITAL_COMMUNITY): Payer: Self-pay | Admitting: Gynecologic Oncology

## 2018-07-14 DIAGNOSIS — L821 Other seborrheic keratosis: Secondary | ICD-10-CM | POA: Diagnosis not present

## 2018-07-14 DIAGNOSIS — Z85828 Personal history of other malignant neoplasm of skin: Secondary | ICD-10-CM | POA: Diagnosis not present

## 2018-07-14 DIAGNOSIS — D0471 Carcinoma in situ of skin of right lower limb, including hip: Secondary | ICD-10-CM | POA: Diagnosis not present

## 2018-07-14 DIAGNOSIS — L578 Other skin changes due to chronic exposure to nonionizing radiation: Secondary | ICD-10-CM | POA: Diagnosis not present

## 2018-07-14 DIAGNOSIS — L570 Actinic keratosis: Secondary | ICD-10-CM | POA: Diagnosis not present

## 2018-07-14 DIAGNOSIS — C44722 Squamous cell carcinoma of skin of right lower limb, including hip: Secondary | ICD-10-CM | POA: Diagnosis not present

## 2018-07-17 ENCOUNTER — Telehealth: Payer: Self-pay | Admitting: *Deleted

## 2018-07-17 ENCOUNTER — Other Ambulatory Visit: Payer: Self-pay

## 2018-07-17 ENCOUNTER — Ambulatory Visit
Admission: RE | Admit: 2018-07-17 | Discharge: 2018-07-17 | Disposition: A | Discharge status: Home / Self Care | Payer: PPO | Source: Ambulatory Visit | Attending: Radiation Oncology | Admitting: Radiation Oncology

## 2018-07-17 ENCOUNTER — Encounter: Payer: Self-pay | Admitting: Radiation Oncology

## 2018-07-17 VITALS — BP 151/76 | HR 75 | Temp 98.2°F | Resp 18 | Ht 65.0 in | Wt 184.4 lb

## 2018-07-17 DIAGNOSIS — R350 Frequency of micturition: Secondary | ICD-10-CM | POA: Diagnosis not present

## 2018-07-17 DIAGNOSIS — Z923 Personal history of irradiation: Secondary | ICD-10-CM | POA: Insufficient documentation

## 2018-07-17 DIAGNOSIS — Z08 Encounter for follow-up examination after completed treatment for malignant neoplasm: Secondary | ICD-10-CM | POA: Diagnosis not present

## 2018-07-17 DIAGNOSIS — G8929 Other chronic pain: Secondary | ICD-10-CM | POA: Insufficient documentation

## 2018-07-17 DIAGNOSIS — Z79899 Other long term (current) drug therapy: Secondary | ICD-10-CM | POA: Diagnosis not present

## 2018-07-17 DIAGNOSIS — M1990 Unspecified osteoarthritis, unspecified site: Secondary | ICD-10-CM | POA: Insufficient documentation

## 2018-07-17 DIAGNOSIS — R011 Cardiac murmur, unspecified: Secondary | ICD-10-CM | POA: Insufficient documentation

## 2018-07-17 DIAGNOSIS — Z7982 Long term (current) use of aspirin: Secondary | ICD-10-CM | POA: Diagnosis not present

## 2018-07-17 DIAGNOSIS — C541 Malignant neoplasm of endometrium: Secondary | ICD-10-CM | POA: Diagnosis not present

## 2018-07-17 DIAGNOSIS — R32 Unspecified urinary incontinence: Secondary | ICD-10-CM | POA: Diagnosis not present

## 2018-07-17 DIAGNOSIS — K5909 Other constipation: Secondary | ICD-10-CM | POA: Diagnosis not present

## 2018-07-17 DIAGNOSIS — Z800 Family history of malignant neoplasm of digestive organs: Secondary | ICD-10-CM | POA: Diagnosis not present

## 2018-07-17 DIAGNOSIS — R300 Dysuria: Secondary | ICD-10-CM | POA: Diagnosis not present

## 2018-07-17 DIAGNOSIS — Z8542 Personal history of malignant neoplasm of other parts of uterus: Secondary | ICD-10-CM | POA: Diagnosis not present

## 2018-07-17 LAB — URINALYSIS, COMPLETE (UACMP) WITH MICROSCOPIC
Bacteria, UA: NONE SEEN
Bilirubin Urine: NEGATIVE
GLUCOSE, UA: NEGATIVE mg/dL
HGB URINE DIPSTICK: NEGATIVE
Ketones, ur: NEGATIVE mg/dL
LEUKOCYTES UA: NEGATIVE
Nitrite: NEGATIVE
Protein, ur: NEGATIVE mg/dL
SPECIFIC GRAVITY, URINE: 1.01 (ref 1.005–1.030)
pH: 7 (ref 5.0–8.0)

## 2018-07-17 NOTE — Telephone Encounter (Signed)
Per Enid Derry in radiation I have scheduled a three month follow up for April 17th. Called and left Enid Derry a message, she will reach out to the patient

## 2018-07-17 NOTE — Telephone Encounter (Signed)
Called patient to inform of fu with Dr. Denman George on 10-24-18 - arrival time- 2:30 pm, lvm for a return call

## 2018-07-17 NOTE — Progress Notes (Signed)
Radiation Oncology         [phone] ________________________________  Name: Veronica Greene MRN: [id number]  Date: 07/17/2018  DOB: Jun 13, 1946  Follow-Up Visit Note  CC: Tower, [name], MD  [name], MD    ICD-10-CM   1. Dysuria R30.0 Urine culture    Urinalysis, Complete w Microscopic  2. Endometrial cancer (HCC)Chronic C54.1     Diagnosis:   73 y.o. female with Stage IB Grade 1 Endometrial Cancer  Interval Since Last Radiation:  7 months   Radiation treatment dates:   Brachytherapy: 11/20/2017, 11/27/2017, 12/05/2017, 12/09/2017, 12/11/2017  Site/dose:  Vaginal cuff / 6 Gy in 5 fractions for a total dose of 30 Gy  Narrative:  The patient returns today for routine follow-up. She complains of chronic pain along her lower back, rated 5-6/10. She states that the right lower quadrant abdominal pain she was having a few months ago was a result of a fall and has since resolved. She would like to have UA and urine culture as she is having increase in urinary frequency both during the day and at night. She reports slight burning with urination. She denies hematuria. She reports white vaginal discharge. She denies vaginal bleeding. She denies rectal bleeding or diarrhea. She reports chronic constipation that is relieved by laxatives. She reports abdominal bloating. She denies nausea or vomiting. She states that she is using the vaginal dilator.    She states that she had a colonoscopy done in November which revealed 13 polyps, but all were negative for malignancy. She will undergo another colonoscopy in November 2020. She reports a family history of colon cancer on her father's side but states she has had genetic testing that was normal. She also reports heart murmur that is followed by her cardiologist, Dr. Ellyn Hack.                        ALLERGIES:  is allergic to clarithromycin; codeine; paroxetine; and penicillins.  Meds: Current Outpatient Medications  Medication Sig Dispense  Refill  . acetic acid-hydrocortisone (ACETASOL HC) OTIC solution Place 2 drops into both ears 2 (two) times daily as needed (itching).    . Ascorbic Acid (VITAMIN C) 1000 MG tablet Take 1,000 mg by mouth daily with breakfast.     . aspirin 81 MG EC tablet Take 1 tablet (81 mg total) by mouth daily. DO NOT TAKE AGAIN UNTIL 07/11/2015 30 tablet 12  . Calcium-Vitamin D-Vitamin K (VIACTIV) [id number] MG-UNT-MCG CHEW Chew 1 tablet by mouth 2 (two) times daily. Morning & afternoon    . cetirizine (ZYRTEC) 10 MG tablet Take 5 mg by mouth as needed for allergies (alternates between mucinex & zyrtec for sinus symptoms).     . cholecalciferol (VITAMIN D) 1000 UNITS tablet Take 1,000 Units by mouth daily with breakfast.     . esomeprazole (NEXIUM) 20 MG capsule Take 40 mg by mouth daily before breakfast.    . fluticasone (FLONASE) 50 MCG/ACT nasal spray Place 2 sprays into both nostrils at bedtime.     Marland Kitchen guaiFENesin (MUCINEX) 600 MG 12 hr tablet Take 600 mg by mouth daily.    . hydrochlorothiazide (HYDRODIURIL) 25 MG tablet Take 1 tablet (25 mg total) by mouth daily with lunch. 90 tablet 3  . ibuprofen (ADVIL,MOTRIN) 800 MG tablet Take 1 tablet (800 mg total) by mouth every 8 (eight) hours as needed for moderate pain. With food 30 tablet 0  . lubiprostone (AMITIZA) 24 MCG capsule Take  1 capsule (24 mcg total) by mouth 2 (two) times daily. LUNCH & SUPPER 180 capsule 3  . meloxicam (MOBIC) 7.5 MG tablet Take 7.5 mg by mouth daily.    . methocarbamol (ROBAXIN) 500 MG tablet Take 1 tablet (500 mg total) by mouth 3 (three) times daily as needed for muscle spasms. 30 tablet 1  . methylPREDNISolone (MEDROL DOSEPAK) 4 MG TBPK tablet     . Multiple Vitamin (MULTIVITAMIN WITH MINERALS) TABS tablet Take 1 tablet by mouth daily. WOMEN'S MULTIVITAMIN    . Polyethyl Glycol-Propyl Glycol (SYSTANE) 0.4-0.3 % SOLN Place 1 drop into both eyes 2 (two) times daily.    . potassium chloride (K-DUR) 10 MEQ tablet Take 2 tablets (20  mEq total) by mouth 2 (two) times daily. 360 tablet 3  . Simethicone (PHAZYME PO) Take 1 tablet by mouth 4 (four) times daily as needed (for gas (typically one tablet at night)).     . sodium chloride (AYR) 0.65 % nasal spray Place 1 spray into the nose 4 (four) times daily as needed for congestion.    . TURMERIC PO Take 1 tablet by mouth daily.    Marland Kitchen venlafaxine XR (EFFEXOR XR) 75 MG 24 hr capsule Take 1 capsule (75 mg total) by mouth daily after breakfast. 90 capsule 3  . acetaminophen (TYLENOL 8 HOUR ARTHRITIS PAIN) 650 MG CR tablet Take 650 mg by mouth daily as needed for pain.      Current Facility-Administered Medications  Medication Dose Route Frequency Provider Last Rate Last Dose  . 0.9 %  sodium chloride infusion  500 mL Intravenous Once Gatha Mayer, MD        Physical Findings: The patient is in no acute distress. Patient is alert and oriented.  height is 5\' 5"  (1.651 m) and weight is 184 lb 6.4 oz (83.6 kg). Her oral temperature is 98.2 F (36.8 C). Her blood pressure is 151/76 (abnormal) and her pulse is 75. Her respiration is 18 and oxygen saturation is 98%.   Lungs are clear to auscultation bilaterally. Heart has murmur (follows up with cardiology concerning this issue). No palpable cervical, supraclavicular, or axillary adenopathy. Abdomen soft, non-tender, normal bowel sounds.  On pelvic examination the external genitalia were unremarkable. A speculum exam was performed. There are no mucosal lesions noted in the vaginal vault. On bimanual examination there were no pelvic masses appreciated.   Lab Findings: Lab Results  Component Value Date   WBC 5.7 10/09/2017   HGB 11.7 (L) 10/09/2017   HCT 34.6 (L) 10/09/2017   MCV 89.9 10/09/2017   PLT 215 10/09/2017    Radiographic Findings: No results found.  Impression:  Stage IB Grade 1 Endometrial Cancer. No evidence of recurrence on clinical exam. Patient complaining of increased urinary frequency and dysuria.  Plan:   Urinalysis and urine culture ordered today. Patient will be called with these results. Follow up with Dr. Denman George in 3 months. Follow up in radiation oncology in 6 months.  ____________________________________  Blair Promise, PhD, MD  This document serves as a record of services personally performed by Gery Pray, MD. It was created on his behalf by Rae Lips, a trained medical scribe. The creation of this record is based on the scribe's personal observations and the provider's statements to them. This document has been checked and approved by the attending provider.

## 2018-07-17 NOTE — Progress Notes (Signed)
Pt presents today for f/u with Dr. Sondra Come. Pt is unaccompanied. Pt c/o pain along lower back, rated 5-6/10. Pt would like to have UA and urine culture R/T she is having increase in frequency both in day and night. Pt reports slight burning with urination. Pt denies hematuria. Pt reports what could be white vaginal discharge. Pt denies vaginal bleeding. Pt denies rectal bleeding, diarrhea. Pt reports chronic constipation is relieved by laxatives. Pt reports abdominal bloating. Pt denies N/V. Pt reports she is using vaginal dilator.   BP (!) 151/76 (BP Location: Left Arm, Patient Position: Sitting)   Pulse 75   Temp 98.2 F (36.8 C) (Oral)   Resp 18   Ht 5\' 5"  (1.651 m)   Wt 184 lb 6.4 oz (83.6 kg)   SpO2 98%   BMI 30.69 kg/m   Wt Readings from Last 3 Encounters:  07/17/18 184 lb 6.4 oz (83.6 kg)  05/20/18 183 lb (83 kg)  05/06/18 183 lb (83 kg)    Loma Sousa, RN BSN

## 2018-07-17 NOTE — Telephone Encounter (Signed)
Patient called and wanted the appt changed appt to April 1st

## 2018-07-18 LAB — URINE CULTURE: Culture: NO GROWTH

## 2018-07-22 ENCOUNTER — Telehealth: Payer: Self-pay

## 2018-07-22 DIAGNOSIS — D0471 Carcinoma in situ of skin of right lower limb, including hip: Secondary | ICD-10-CM | POA: Diagnosis not present

## 2018-07-22 DIAGNOSIS — L080 Pyoderma: Secondary | ICD-10-CM | POA: Diagnosis not present

## 2018-07-22 NOTE — Telephone Encounter (Signed)
Contacted pt to convey that urine culture was normal. Recommended to pt that if dysuria persists to use AZO OTC as directed. Pt verbalized understanding and agreement. Loma Sousa, RN BSN

## 2018-07-29 ENCOUNTER — Encounter (HOSPITAL_BASED_OUTPATIENT_CLINIC_OR_DEPARTMENT_OTHER): Payer: Self-pay

## 2018-07-29 ENCOUNTER — Emergency Department (HOSPITAL_BASED_OUTPATIENT_CLINIC_OR_DEPARTMENT_OTHER)
Admission: EM | Admit: 2018-07-29 | Discharge: 2018-07-29 | Disposition: A | Discharge status: Home / Self Care | Payer: PPO | Attending: Emergency Medicine | Admitting: Emergency Medicine

## 2018-07-29 ENCOUNTER — Other Ambulatory Visit: Payer: Self-pay

## 2018-07-29 DIAGNOSIS — M25532 Pain in left wrist: Secondary | ICD-10-CM | POA: Diagnosis not present

## 2018-07-29 DIAGNOSIS — Z79899 Other long term (current) drug therapy: Secondary | ICD-10-CM | POA: Diagnosis not present

## 2018-07-29 DIAGNOSIS — I10 Essential (primary) hypertension: Secondary | ICD-10-CM | POA: Diagnosis not present

## 2018-07-29 NOTE — ED Provider Notes (Signed)
[facility] EMERGENCY DEPARTMENT Provider Note   CSN: [id number] Arrival date & time: 07/29/18  1624     History   Chief Complaint Chief Complaint  Patient presents with  . Arm Pain    HPI Veronica Greene is a 73 y.o. female.  The history is provided by the patient.  Arm Pain  This is a new problem. The current episode started 6 to 12 hours ago. The problem occurs rarely. Progression since onset: better with rest, only hurt to palpation/movement. Pertinent negatives include no chest pain, no abdominal pain, no headaches and no shortness of breath. The symptoms are aggravated by twisting. The symptoms are relieved by rest. She has tried rest for the symptoms. The treatment provided mild relief.    Past Medical History:  Diagnosis Date  . Allergic rhinitis   . Arthritis    knees  . Depression   . Depression with anxiety   . Endometrial cancer [facility])    02/ 2019  Diagnosed with D&C/hysteroscopy with polypectomy  . Family history of colon cancer   . Family history of colonic polyps   . Fibromyalgia    tx mobic  . Full dentures   . GERD (gastroesophageal reflux disease)   . Gout   . Hearing loss    wears bilateral hearing aids  . Heart murmur   . History of colon polyps   . History of nonmelanoma skin cancer    right lower leg  . Hypertension   . IBS (irritable bowel syndrome)    with constipation  . Moderate aortic stenosis by prior echocardiogram 07/30/2017---  cardiologist-- dr harding   Previously diagnosed with murmur back in 2013. echo 07-30-2017 : Moderate AS with mild AR,  valve area 1.15cm^2,  Mean gradient 23 mmHg., peak gradient 77mmHg  . Osteopenia   . Polio age 60 months   right leg weakness and 1.5 inches shorter than left leg.   . Skin cancer 2019   Right leg  . Urgency of urination   . UTI (urinary tract infection) 01/2018  . Wears hearing aid in both ears     Patient Active Problem List   Diagnosis Date Noted  . Genetic testing  06/23/2018  . Family history of colon cancer   . Family history of colonic polyps   . Right groin pain 04/22/2018  . Right hip pain 04/22/2018  . Fall at home 04/22/2018  . Urinary frequency 04/22/2018  . Elevated random blood glucose level 11/11/2017  . Endometrial cancer ([place]) 09/12/2017  . Essential hypertension 08/29/2017  . Moderate aortic stenosis by prior echocardiogram 08/27/2017  . Heart murmur 07/23/2017  . Estrogen deficiency 11/05/2016  . Routine general medical examination at a health care facility 10/15/2015  . Encounter for Medicare annual wellness exam 09/30/2013  . Back pain 02/16/2013  . Hyperlipidemia, mild 09/02/2012  . Other screening mammogram 12/12/2011  . Degenerative disk disease 08/31/2011  . Hypokalemia 01/02/2011  . Personal history of colonic polyps 11/03/2010  . Constipation, slow transit 11/03/2010  . Melanosis coli 11/03/2010  . Gout 08/25/2010  . DERMATITIS, SEBORRHEIC 08/25/2010  . Osteopenia 09/05/2007  . History of poliomyelitis 08/26/2007  . Obesity 08/26/2007  . ANXIETY 08/26/2007  . DEPRESSION 08/26/2007  . ALLERGIC RHINITIS 08/26/2007  . GERD 08/26/2007  . IBS 08/26/2007  . OVERACTIVE BLADDER 08/26/2007  . OSTEOARTHRITIS, HANDS, BILATERAL 08/26/2007  . FIBROMYALGIA 08/26/2007  . EDEMA, CHRONIC 08/26/2007  . URINARY INCONTINENCE 08/26/2007  . HEMATURIA, HX OF 08/26/2007  Past Surgical History:  Procedure Laterality Date  . ANKLE SURGERY Right 1956  . CARPAL TUNNEL RELEASE Bilateral 1986, 1993  . COLONOSCOPY N/A 06/24/2015   Procedure: COLONOSCOPY;  Surgeon: Gatha Mayer, MD;  Location: [facility];  Service: Endoscopy;  Laterality: N/A;  . DILATATION & CURETTAGE/HYSTEROSCOPY WITH MYOSURE N/A 08/09/2017   Procedure: DILATATION & CURETTAGE/HYSTEROSCOPY WITH MYOSURE;  Surgeon: Christophe Louis, MD;  Location: [facility];  Service: Gynecology;  Laterality: N/A;  Polypectomy  . DILATION AND CURETTAGE OF UTERUS     PMB  . HAND SURGERY  Bilateral 1999   Thumbs   . KNEE SURGERY Bilateral 1998   x2  . LYMPH NODE BIOPSY N/A 10/08/2017   Procedure: LYMPH NODE BIOPSY;  Surgeon: [name], MD;  Location: [facility];  Service: Gynecology;  Laterality: N/A;  . MULTIPLE TOOTH EXTRACTIONS    . NASAL SINUS SURGERY  1976  . polio surgery.     right foot - right leg 1.5" shorter than left leg - some wekness  . ROBOTIC ASSISTED TOTAL HYSTERECTOMY WITH BILATERAL SALPINGO OOPHERECTOMY Bilateral 10/08/2017   Procedure: XI ROBOTIC ASSISTED TOTAL HYSTERECTOMY WITH BILATERAL SALPINGO OOPHORECTOMY;  Surgeon: [name], MD;  Location: [facility];  Service: Gynecology;  Laterality: Bilateral;  . THUMB ARTHROSCOPY  1999  . TONSILLECTOMY  2549   73 years old  . TRANSTHORACIC ECHOCARDIOGRAM  07/30/2017   mild LVH, ef 97-98%, grade 2 diastolic dysfunction/ moderately thickened and mild calcified AV leaflets with moderate stenosis & mild AR (valve area 1.15cm^2, mean gradiant 7mmHg, peak grandient 70mmHg)/ mild LAE and RAE/ trivial TR  . TUBAL LIGATION    . UPPER GI ENDOSCOPY     normal  . WISDOM TOOTH EXTRACTION       OB History   No obstetric history on file.      Home Medications    Prior to Admission medications   Medication Sig Start Date End Date Taking? Authorizing Provider  acetaminophen (TYLENOL 8 HOUR ARTHRITIS PAIN) 650 MG CR tablet Take 650 mg by mouth daily as needed for pain.     [provider]  acetic acid-hydrocortisone (ACETASOL HC) OTIC solution Place 2 drops into both ears 2 (two) times daily as needed (itching).    [provider]  Ascorbic Acid (VITAMIN C) 1000 MG tablet Take 1,000 mg by mouth daily with breakfast.     [provider]  aspirin 81 MG EC tablet Take 1 tablet (81 mg total) by mouth daily. DO NOT TAKE AGAIN UNTIL 07/11/2015 06/24/15   Gatha Mayer, MD  Calcium-Vitamin D-Vitamin K (VIACTIV) [id number] MG-UNT-MCG CHEW Chew 1 tablet by mouth 2 (two) times daily. Morning & afternoon     [provider]  cetirizine (ZYRTEC) 10 MG tablet Take 5 mg by mouth as needed for allergies (alternates between mucinex & zyrtec for sinus symptoms).     [provider]  cholecalciferol (VITAMIN D) 1000 UNITS tablet Take 1,000 Units by mouth daily with breakfast.     [provider]  esomeprazole (NEXIUM) 20 MG capsule Take 40 mg by mouth daily before breakfast.    [provider]  fluticasone (FLONASE) 50 MCG/ACT nasal spray Place 2 sprays into both nostrils at bedtime.     [provider]  guaiFENesin (MUCINEX) 600 MG 12 hr tablet Take 600 mg by mouth daily.    [provider]  hydrochlorothiazide (HYDRODIURIL) 25 MG tablet Take 1 tablet (25 mg total) by mouth daily with lunch. 11/11/17  Tower, [name], MD  ibuprofen (ADVIL,MOTRIN) 800 MG tablet Take 1 tablet (800 mg total) by mouth every 8 (eight) hours as needed for moderate pain. With food 04/22/18   Tower, [name], MD  lubiprostone (AMITIZA) 24 MCG capsule Take 1 capsule (24 mcg total) by mouth 2 (two) times daily. LUNCH & SUPPER 11/11/17   Tower, [name], MD  meloxicam (MOBIC) 7.5 MG tablet Take 7.5 mg by mouth daily.    [provider]  methocarbamol (ROBAXIN) 500 MG tablet Take 1 tablet (500 mg total) by mouth 3 (three) times daily as needed for muscle spasms. 04/22/18   Tower, [name], MD  methylPREDNISolone (MEDROL DOSEPAK) 4 MG TBPK tablet  05/05/18   [provider]  Multiple Vitamin (MULTIVITAMIN WITH MINERALS) TABS tablet Take 1 tablet by mouth daily. WOMEN'S MULTIVITAMIN    [provider]  Polyethyl Glycol-Propyl Glycol (SYSTANE) 0.4-0.3 % SOLN Place 1 drop into both eyes 2 (two) times daily.    [provider]  potassium chloride (K-DUR) 10 MEQ tablet Take 2 tablets (20 mEq total) by mouth 2 (two) times daily. 11/11/17   Tower, [name], MD  Simethicone (PHAZYME PO) Take 1 tablet by mouth 4 (four) times daily as needed (for gas (typically one tablet  at night)).     [provider]  sodium chloride (AYR) 0.65 % nasal spray Place 1 spray into the nose 4 (four) times daily as needed for congestion.    [provider]  TURMERIC PO Take 1 tablet by mouth daily.    [provider]  venlafaxine XR (EFFEXOR XR) 75 MG 24 hr capsule Take 1 capsule (75 mg total) by mouth daily after breakfast. 11/11/17   Tower, [name], MD    Family History Family History  Problem Relation Age of Onset  . Kidney disease Mother   . Alzheimer's disease Mother   . Stroke Father 28  . Colon cancer Father 32       possibly colon cancer, possibly just polyps  . Kidney disease Sister   . Heart attack Sister   . Colon cancer Paternal Aunt 22  . Cancer Paternal Uncle        unk type  . Esophageal cancer Neg Hx   . Stomach cancer Neg Hx     Social History Social History   Tobacco Use  . Smoking status: Never Smoker  . Smokeless tobacco: Never Used  Substance Use Topics  . Alcohol use: No    Alcohol/week: 0.0 standard drinks  . Drug use: No     Allergies   Clarithromycin; Codeine; Paroxetine; and Penicillins   Review of Systems Review of Systems  Constitutional: Negative for chills and fever.  HENT: Negative for ear pain and sore throat.   Eyes: Negative for pain and visual disturbance.  Respiratory: Negative for cough and shortness of breath.   Cardiovascular: Negative for chest pain and palpitations.  Gastrointestinal: Positive for nausea. Negative for abdominal pain and vomiting.  Genitourinary: Negative for dysuria and hematuria.  Musculoskeletal: Positive for arthralgias. Negative for back pain, gait problem, joint swelling, myalgias, neck pain and neck stiffness.  Skin: Negative for color change and rash.  Neurological: Positive for numbness. Negative for dizziness, tremors, seizures, syncope, facial asymmetry, speech difficulty, weakness, light-headedness and headaches.  All other systems reviewed and are  negative.    Physical Exam Updated Vital Signs  ED Triage Vitals  Enc Vitals Group     BP 07/29/18 1632 (!) 173/72  Pulse Rate 07/29/18 1632 77     Resp 07/29/18 1632 18     Temp 07/29/18 1632 98.1 F (36.7 C)     Temp Source 07/29/18 1632 Oral     SpO2 07/29/18 1632 99 %     Weight 07/29/18 1636 184 lb (83.5 kg)     Height 07/29/18 1636 5\' 5"  (1.651 m)     Head Circumference --      Peak Flow --      Pain Score 07/29/18 1636 5     Pain Loc --      Pain Edu? --      Excl. in [place]? --     Physical Exam Vitals signs and nursing note reviewed.  Constitutional:      General: She is not in acute distress.    Appearance: She is well-developed.  HENT:     Head: Normocephalic and atraumatic.     Nose: Nose normal.     Mouth/Throat:     Mouth: Mucous membranes are moist.  Eyes:     Extraocular Movements: Extraocular movements intact.     Conjunctiva/sclera: Conjunctivae normal.     Pupils: Pupils are equal, round, and reactive to light.  Neck:     Musculoskeletal: Normal range of motion and neck supple. No muscular tenderness.     Comments: Negative spurlings Cardiovascular:     Rate and Rhythm: Normal rate and regular rhythm.     Pulses: Normal pulses.     Heart sounds: Normal heart sounds. No murmur.  Pulmonary:     Effort: Pulmonary effort is normal. No respiratory distress.     Breath sounds: Normal breath sounds.  Abdominal:     General: There is no distension.     Palpations: Abdomen is soft.     Tenderness: There is no abdominal tenderness.  Musculoskeletal: Normal range of motion.        General: Tenderness present. No swelling.     Comments: Tenderness to palpation over carpal tunnel area in the left wrist area  Skin:    General: Skin is warm and dry.     Capillary Refill: Capillary refill takes less than 2 seconds.  Neurological:     Mental Status: She is alert and oriented to person, place, and time.     Cranial Nerves: No cranial nerve deficit.      Motor: No weakness.     Coordination: Coordination normal.     Gait: Gait normal.     Comments: Cranial nerves intact, patient with numbness over the median distribution of her left forearm, left hand but otherwise normal sensation throughout, 5+ out of 5 strength throughout, no drift, normal speech  Psychiatric:        Mood and Affect: Mood normal.      ED Treatments / Results  Labs (all labs ordered are listed, but only abnormal results are displayed) Labs Reviewed - No data to display  EKG EKG Interpretation  Date/Time:  Tuesday July 29 2018 16:45:26 EST Ventricular Rate:  71 PR Interval:    QRS Duration: 89 QT Interval:  443 QTC Calculation: 482 R Axis:   9 Text Interpretation:  Sinus rhythm Low voltage, precordial leads Anteroseptal infarct, old Confirmed by Lennice Sites [phone]) on 07/29/2018 4:49:12 PM   Radiology No results found.  Procedures Procedures (including critical care time)  Medications Ordered in ED Medications - No data to display   Initial Impression / Assessment and Plan / ED Course  I have reviewed  the triage vital signs and the nursing notes.  Pertinent labs & imaging results that were available during my care of the patient were reviewed by me and considered in my medical decision making (see chart for details).     Candelaria Tarah Buboltz is a 73 year old female with history of hypertension, reflux, depression, osteopenia, fibromyalgia, carpal tunnel syndrome with carpal tunnel release in the 90s who presents to the ED with left wrist pain.  Patient with normal vitals.  No fever.  Patient frequently knits and does other repetitive motion with her hands.  Has noticed worsening left wrist pain today. No specific trauma.  Has had some tingling in her left hand.  Patient overall with normal neuro exam except for some numbness in the median distribution in her left upper extremity.  She has no midline spinal pain.  No neck pain.  Doubt central  process.  Patient appears to have likely peripheral neuropathy likely from carpal tunnel syndrome.  Patient has normal strength as well in the left hand.  Patient denies any chest pain, shortness of breath.  Has had some nausea but no vomiting.  No abdominal pain.  EKG was performed in triage which showed sinus rhythm.  No signs of ischemic changes.  No concern for stroke, spinal process.  Likely carpal tunnel syndrome. Possible arthritis.  Patient already on meloxicam.  Recommend Tylenol, ice, rest.  Patient was given a wrist splint.  Recommend follow-up with primary care doctor to help arrange possible nerve conduction studies, hand referral.  Patient hemodynamically stable throughout my care and discharged in good condition. Given return precautions.  This chart was dictated using voice recognition software.  Despite best efforts to proofread,  errors can occur which can change the documentation meaning.   Final Clinical Impressions(s) / ED Diagnoses   Final diagnoses:  Acute pain of left wrist    ED Discharge Orders    None       Lennice Sites, DO 07/29/18 1656

## 2018-07-29 NOTE — ED Triage Notes (Signed)
Pt c/o isolated L wrist pain and arm "tingling" that started this AM after completing household chores. Pt denies symptoms to face or legs. Pt denies confusion or aphasia. Pt states she has had intermittent nausea today.

## 2018-08-01 DIAGNOSIS — M25532 Pain in left wrist: Secondary | ICD-10-CM | POA: Diagnosis not present

## 2018-08-22 DIAGNOSIS — M170 Bilateral primary osteoarthritis of knee: Secondary | ICD-10-CM | POA: Diagnosis not present

## 2018-08-22 DIAGNOSIS — M25532 Pain in left wrist: Secondary | ICD-10-CM | POA: Diagnosis not present

## 2018-08-25 DIAGNOSIS — L570 Actinic keratosis: Secondary | ICD-10-CM | POA: Diagnosis not present

## 2018-08-25 DIAGNOSIS — L821 Other seborrheic keratosis: Secondary | ICD-10-CM | POA: Diagnosis not present

## 2018-08-25 DIAGNOSIS — Z85828 Personal history of other malignant neoplasm of skin: Secondary | ICD-10-CM | POA: Diagnosis not present

## 2018-08-29 ENCOUNTER — Ambulatory Visit (HOSPITAL_COMMUNITY): Payer: PPO | Attending: Cardiology

## 2018-08-29 DIAGNOSIS — I350 Nonrheumatic aortic (valve) stenosis: Secondary | ICD-10-CM | POA: Diagnosis not present

## 2018-08-29 HISTORY — PX: TRANSTHORACIC ECHOCARDIOGRAM: SHX275

## 2018-09-02 ENCOUNTER — Ambulatory Visit: Payer: PPO | Admitting: Cardiology

## 2018-09-02 ENCOUNTER — Encounter: Payer: Self-pay | Admitting: Cardiology

## 2018-09-02 VITALS — BP 128/54 | HR 84 | Ht 67.0 in | Wt 183.0 lb

## 2018-09-02 DIAGNOSIS — I350 Nonrheumatic aortic (valve) stenosis: Secondary | ICD-10-CM

## 2018-09-02 DIAGNOSIS — E785 Hyperlipidemia, unspecified: Secondary | ICD-10-CM

## 2018-09-02 DIAGNOSIS — I10 Essential (primary) hypertension: Secondary | ICD-10-CM | POA: Diagnosis not present

## 2018-09-02 NOTE — Progress Notes (Signed)
PCP: Abner Greenspan, MD  Clinic Note: Chief Complaint  Patient presents with  . Follow-up    Post echo.  . Aortic Stenosis    Progression of disease    HPI: Veronica Greene is a 73 y.o. female with a PMH below who presents today for Annual f/u for aortic stenosis with progression of disease on recent echo .  Veronica Greene was last seen on August 27, 2017 -- > denied any cardiac symptoms.  Recent Hospitalizations: none  Studies Personally Reviewed - (if available, images/films reviewed: From Epic Chart or Care Everywhere)  Echo 08/29/2018: Mod LVH. Mod-Severe AS (mild AI)  - mean gradient now 38 mmHg. Mild-Mod MAC. No MS.   Interval History: And returns here today with her husband noting that she has been relatively stable for the last year or so.  She has occasional episodes of sharp pains in the front of her chest as well as radiating to the back of her chest.  These are sharp symptoms that are very short-lived.  It can be worse with stress, not notably exertional dyspnea.  She has some baseline dyspnea right now because of allergies. Otherwise she is pretty asymptomatic overall for cardiac standpoint.   No PND, orthopnea or edema. No palpitations, lightheadedness, dizziness, weakness or syncope/near syncope. No TIA/amaurosis fugax symptoms. No claudication.  ROS: A comprehensive was performed. Review of Systems  Constitutional: Negative for malaise/fatigue and weight loss.  HENT: Positive for congestion. Negative for nosebleeds.   Respiratory: Positive for cough.        Allergies  Gastrointestinal: Negative for blood in stool and melena.  Genitourinary: Negative for hematuria.  Neurological: Negative for dizziness and headaches.       Still has some mild poor balance from her post polio  Endo/Heme/Allergies: Positive for environmental allergies.  All other systems reviewed and are negative.  I have reviewed and (if needed) personally updated the patient's  problem list, medications, allergies, past medical and surgical history, social and family history.   Past Medical History:  Diagnosis Date  . Allergic rhinitis   . Arthritis    knees  . Depression   . Depression with anxiety   . Endometrial cancer [facility])    02/ 2019  Diagnosed with D&C/hysteroscopy with polypectomy  . Family history of colon cancer   . Family history of colonic polyps   . Fibromyalgia    tx mobic  . Full dentures   . GERD (gastroesophageal reflux disease)   . Gout   . Hearing loss    wears bilateral hearing aids  . Heart murmur   . History of colon polyps   . History of nonmelanoma skin cancer    right lower leg  . Hypertension   . IBS (irritable bowel syndrome)    with constipation  . Moderate aortic stenosis by prior echocardiogram 07/30/2017---  cardiologist-- dr harding   Previously diagnosed with murmur back in 2013. echo 07-30-2017 : Moderate AS with mild AR,  valve area 1.15cm^2,  Mean gradient 23 mmHg., peak gradient 44mHg  . Osteopenia   . Polio age 523 months  right leg weakness and 1.5 inches shorter than left leg.   . Skin cancer 2019   Right leg  . Urgency of urination   . UTI (urinary tract infection) 01/2018  . Wears hearing aid in both ears     Past Surgical History:  Procedure Laterality Date  . ANKLE SURGERY Right 1956  . CARPAL TUNNEL RELEASE  Bilateral 1986, 1993  . COLONOSCOPY N/A 06/24/2015   Procedure: COLONOSCOPY;  Surgeon: Gatha Mayer, MD;  Location: [facility];  Service: Endoscopy;  Laterality: N/A;  . DILATATION & CURETTAGE/HYSTEROSCOPY WITH MYOSURE N/A 08/09/2017   Procedure: DILATATION & CURETTAGE/HYSTEROSCOPY WITH MYOSURE;  Surgeon: Christophe Louis, MD;  Location: [facility];  Service: Gynecology;  Laterality: N/A;  Polypectomy  . DILATION AND CURETTAGE OF UTERUS     PMB  . HAND SURGERY Bilateral 1999   Thumbs   . KNEE SURGERY Bilateral 1998   x2  . LYMPH NODE BIOPSY N/A 10/08/2017   Procedure: LYMPH NODE BIOPSY;  Surgeon:  [name], MD;  Location: [facility];  Service: Gynecology;  Laterality: N/A;  . MULTIPLE TOOTH EXTRACTIONS    . NASAL SINUS SURGERY  1976  . polio surgery.     right foot - right leg 1.5" shorter than left leg - some wekness  . ROBOTIC ASSISTED TOTAL HYSTERECTOMY WITH BILATERAL SALPINGO OOPHERECTOMY Bilateral 10/08/2017   Procedure: XI ROBOTIC ASSISTED TOTAL HYSTERECTOMY WITH BILATERAL SALPINGO OOPHORECTOMY;  Surgeon: [name], MD;  Location: [facility];  Service: Gynecology;  Laterality: Bilateral;  . THUMB ARTHROSCOPY  1999  . TONSILLECTOMY  574   73 years old  . TRANSTHORACIC ECHOCARDIOGRAM  07/30/2017   mild LVH, ef 22-48%, grade 2 diastolic dysfunction/ moderately thickened and mild calcified AV leaflets with moderate stenosis & mild AR (valve area 1.15cm^2, mean gradiant 58mHg, peak grandient 452mg)/ mild LAE and RAE/ trivial TR  . TRANSTHORACIC ECHOCARDIOGRAM  08/29/2018   Mod LVH. Mod-Severe AS (mild AI) - mean gradient now 38 mmHg. Mild-Mod MAC. No MS.  --notable progression of disease  . TUBAL LIGATION    . UPPER GI ENDOSCOPY     normal  . WISDOM TOOTH EXTRACTION      Current Meds  Medication Sig  . acetaminophen (TYLENOL 8 HOUR ARTHRITIS PAIN) 650 MG CR tablet Take 650 mg by mouth daily as needed for pain.   . Marland Kitchencetic acid-hydrocortisone (ACETASOL HC) OTIC solution Place 2 drops into both ears 2 (two) times daily as needed (itching).  . Ascorbic Acid (VITAMIN C) 1000 MG tablet Take 1,000 mg by mouth daily with breakfast.   . Calcium-Vitamin D-Vitamin K (VIACTIV) 50250-037-04G-UNT-MCG CHEW Chew 1 tablet by mouth 2 (two) times daily. Morning & afternoon  . cetirizine (ZYRTEC) 10 MG tablet Take 5 mg by mouth as needed for allergies (alternates between mucinex & zyrtec for sinus symptoms).   . cholecalciferol (VITAMIN D) 1000 UNITS tablet Take 1,000 Units by mouth daily with breakfast.   . esomeprazole (NEXIUM) 20 MG capsule Take 40 mg by mouth daily before breakfast.  . fluticasone  (FLONASE) 50 MCG/ACT nasal spray Place 2 sprays into both nostrils at bedtime.   . Marland KitchenuaiFENesin (MUCINEX) 600 MG 12 hr tablet Take 600 mg by mouth daily.  . hydrochlorothiazide (HYDRODIURIL) 25 MG tablet Take 1 tablet (25 mg total) by mouth daily with lunch.  . ibuprofen (ADVIL,MOTRIN) 800 MG tablet Take 1 tablet (800 mg total) by mouth every 8 (eight) hours as needed for moderate pain. With food  . lubiprostone (AMITIZA) 24 MCG capsule Take 1 capsule (24 mcg total) by mouth 2 (two) times daily. LUNCH & SUPPER  . meloxicam (MOBIC) 7.5 MG tablet Take 7.5 mg by mouth daily.  . Multiple Vitamin (MULTIVITAMIN WITH MINERALS) TABS tablet Take 1 tablet by mouth daily. WOMEN'S MULTIVITAMIN  . Polyethyl Glycol-Propyl Glycol (SYSTANE) 0.4-0.3 % SOLN Place 1 drop into both eyes  2 (two) times daily.  . potassium chloride (K-DUR) 10 MEQ tablet Take 2 tablets (20 mEq total) by mouth 2 (two) times daily.  . Simethicone (PHAZYME PO) Take 1 tablet by mouth 4 (four) times daily as needed (for gas (typically one tablet at night)).   . sodium chloride (AYR) 0.65 % nasal spray Place 1 spray into the nose 4 (four) times daily as needed for congestion.  . TURMERIC PO Take 1 tablet by mouth daily.  Marland Kitchen venlafaxine XR (EFFEXOR XR) 75 MG 24 hr capsule Take 1 capsule (75 mg total) by mouth daily after breakfast.  . [DISCONTINUED] methocarbamol (ROBAXIN) 500 MG tablet Take 1 tablet (500 mg total) by mouth 3 (three) times daily as needed for muscle spasms.  . [DISCONTINUED] methylPREDNISolone (MEDROL DOSEPAK) 4 MG TBPK tablet    Current Facility-Administered Medications for the 09/02/18 encounter (Office Visit) with Leonie Man, MD  Medication  . 0.9 %  sodium chloride infusion    Allergies  Allergen Reactions  . Clarithromycin Swelling    throat swells  . Codeine Nausea And Vomiting  . Paroxetine Other (See Comments)    Ineffective.  Marland Kitchen Penicillins Hives    welts Has patient had a PCN reaction causing immediate  rash, facial/tongue/throat swelling, SOB or lightheadedness with hypotension: No Has patient had a PCN reaction causing severe rash involving mucus membranes or skin necrosis: Unknown Has patient had a PCN reaction that required hospitalization:No Has patient had a PCN reaction occurring within the last 10 years: No If all of the above answers are "NO", then may proceed with Cephalosporin use.     Social History   Tobacco Use  . Smoking status: Never Smoker  . Smokeless tobacco: Never Used  Substance Use Topics  . Alcohol use: No    Alcohol/week: 0.0 standard drinks  . Drug use: No   Social History   Social History Narrative   She is relatively recently remarried.  She has 2  children and 3 grandchildren.     She is currently retired.  But enjoys cleaning house and doing chores.  She likes to do yard work.  Does not routinely exercise.    family history includes Alzheimer's disease in her mother; Cancer in her paternal uncle; Colon cancer (age of onset: 73) in her father and paternal aunt; Heart attack in her sister; Kidney disease in her mother and sister; Stroke (age of onset: 77) in her father.  Wt Readings from Last 3 Encounters:  09/02/18 183 lb (83 kg)  07/29/18 184 lb (83.5 kg)  07/17/18 184 lb 6.4 oz (83.6 kg)    PHYSICAL EXAM BP (!) 128/54 (BP Location: Left Arm, Patient Position: Sitting, Cuff Size: Normal)   Pulse 84   Ht _0  (1.702 m)   Wt 183 lb (83 kg)   BMI 28.66 kg/m  Physical Exam  Constitutional: She is oriented to person, place, and time. She appears well-developed and well-nourished. No distress.  Neck: Full passive range of motion without pain. Neck supple. No hepatojugular reflux and no JVD present. Carotid bruit is not present (Radiated AS murmur).  Cardiovascular: Normal rate, regular rhythm and intact distal pulses.  No extrasystoles are present. PMI is not displaced. Exam reveals no gallop and no friction rub.  Murmur heard.  Harsh  crescendo-decrescendo midsystolic murmur is present with a grade of 3/6 at the upper right sternal border radiating to the neck. Pulmonary/Chest: Effort normal and breath sounds normal. No respiratory distress. She has no wheezes.  She has no rales.  Abdominal: Soft. Bowel sounds are normal. She exhibits no distension. There is no abdominal tenderness.  No HSM  Musculoskeletal: Normal range of motion.        General: Deformity (Different size shoes because of different leg length) present. No edema.  Neurological: She is alert and oriented to person, place, and time.  Psychiatric: She has a normal mood and affect. Her behavior is normal. Judgment and thought content normal.  Seems a little bit dysthymic  Vitals reviewed.    Adult ECG Report Not checked  Other studies Reviewed: Additional studies/ records that were reviewed today include:  Recent Labs: From May 2019-TC 170, TG 139, HDL 54, LDL 90.  Hgb 11.7.  CR 0.57, K+ 3.8.  TSH 1.55.   ASSESSMENT / PLAN: Problem List Items Addressed This Visit    Essential hypertension (Chronic)    Stable blood pressures.  Is only on HCTZ.      Hyperlipidemia, mild (Chronic)    Followed by PCP.  Should target LDL less than 100 with aortic valve stenosis.      Moderate calcific aortic stenosis - Primary (Chronic)    Progression now from moderate to moderate severe aortic stenosis with a mean gradient just below the severe range (currently 38, severe range to be greater than 40 mmHg).  Remains relatively asymptomatic.  We reviewed symptoms of concern for symptomatic AS.  Recheck echo in 6 months to ensure no further rapid progression.      Relevant Orders   ECHOCARDIOGRAM COMPLETE      I spent a total of 25 minutes with the patient and chart review. >  50% of the time was spent in direct patient consultation.   Current medicines are reviewed at length with the patient today.  (+/- concerns) none The following changes have been made:   None  Patient Instructions  Medication Instructions:  Your physician recommends that you continue on your current medications as directed. Please refer to the Current Medication list given to you today.  If you need a refill on your cardiac medications before your next appointment, please call your pharmacy.    Testing/Procedures: Your physician has requested that you have an echocardiogram in 6 months (August 2020). Echocardiography is a painless test that uses sound waves to create images of your heart. It provides your doctor with information about the size and shape of your heart and how well your heart's chambers and valves are working. This procedure takes approximately one hour. There are no restrictions for this procedure.  Follow-Up: At [facility], you and your health needs are our priority.  As part of our continuing mission to provide you with exceptional heart care, we have created designated Provider Care Teams.  These Care Teams include your primary Cardiologist (physician) and Advanced Practice Providers (APPs -  Physician Assistants and Nurse Practitioners) who all work together to provide you with the care you need, when you need it. You will need a follow up appointment after your echo in 6 months.  Please call our office 2 months in advance to schedule this appointment.  You may see Glenetta Hew, MD or one of the following Advanced Practice Providers on your designated Care Team:   Rosaria Ferries, PA-C . Jory Sims, DNP, ANP  Any Other Special Instructions Will Be Listed Below (If Applicable). None    Studies Ordered:   Orders Placed This Encounter  Procedures  . ECHOCARDIOGRAM COMPLETE      Glenetta Hew,  M.D., M.S. Interventional Cardiologist   Pager # [phone] Phone # [phone] [address]. [place], [place] [address]   Thank you for choosing [facility] at [facility]!!

## 2018-09-02 NOTE — Patient Instructions (Signed)
Medication Instructions:  Your physician recommends that you continue on your current medications as directed. Please refer to the Current Medication list given to you today.  If you need a refill on your cardiac medications before your next appointment, please call your pharmacy.    Testing/Procedures: Your physician has requested that you have an echocardiogram in 6 months (August 2020). Echocardiography is a painless test that uses sound waves to create images of your heart. It provides your doctor with information about the size and shape of your heart and how well your heart's chambers and valves are working. This procedure takes approximately one hour. There are no restrictions for this procedure.  Follow-Up: At [facility], you and your health needs are our priority.  As part of our continuing mission to provide you with exceptional heart care, we have created designated Provider Care Teams.  These Care Teams include your primary Cardiologist (physician) and Advanced Practice Providers (APPs -  Physician Assistants and Nurse Practitioners) who all work together to provide you with the care you need, when you need it. You will need a follow up appointment after your echo in 6 months.  Please call our office 2 months in advance to schedule this appointment.  You may see Glenetta Hew, MD or one of the following Advanced Practice Providers on your designated Care Team:   Rosaria Ferries, PA-C . Jory Sims, DNP, ANP  Any Other Special Instructions Will Be Listed Below (If Applicable). None

## 2018-09-07 ENCOUNTER — Encounter: Payer: Self-pay | Admitting: Cardiology

## 2018-09-07 NOTE — Assessment & Plan Note (Signed)
Followed by PCP.  Should target LDL less than 100 with aortic valve stenosis.

## 2018-09-07 NOTE — Assessment & Plan Note (Signed)
Progression now from moderate to moderate severe aortic stenosis with a mean gradient just below the severe range (currently 38, severe range to be greater than 40 mmHg).  Remains relatively asymptomatic.  We reviewed symptoms of concern for symptomatic AS.  Recheck echo in 6 months to ensure no further rapid progression.

## 2018-09-07 NOTE — Assessment & Plan Note (Signed)
Stable blood pressures.  Is only on HCTZ.

## 2018-09-25 ENCOUNTER — Ambulatory Visit: Payer: PPO | Admitting: Podiatry

## 2018-09-25 ENCOUNTER — Other Ambulatory Visit: Payer: Self-pay

## 2018-09-25 ENCOUNTER — Ambulatory Visit (INDEPENDENT_AMBULATORY_CARE_PROVIDER_SITE_OTHER): Payer: PPO

## 2018-09-25 ENCOUNTER — Encounter: Payer: Self-pay | Admitting: Podiatry

## 2018-09-25 DIAGNOSIS — M79672 Pain in left foot: Secondary | ICD-10-CM

## 2018-09-25 DIAGNOSIS — M778 Other enthesopathies, not elsewhere classified: Secondary | ICD-10-CM

## 2018-09-25 DIAGNOSIS — M7752 Other enthesopathy of left foot: Secondary | ICD-10-CM | POA: Diagnosis not present

## 2018-09-25 DIAGNOSIS — M779 Enthesopathy, unspecified: Secondary | ICD-10-CM

## 2018-09-25 MED ORDER — TRIAMCINOLONE ACETONIDE 10 MG/ML IJ SUSP
10.0000 mg | Freq: Once | INTRAMUSCULAR | Status: AC
  Administered 2018-09-25: 10 mg

## 2018-09-28 NOTE — Progress Notes (Signed)
Subjective:   Patient ID: Veronica Greene, female   DOB: 73 y.o.   MRN: [id number]   HPI Patient presents with pain on top of the left foot that is been bothering her for a while and also some discomfort in the medial ankle.  Patient states it is localized and is been present in the past   ROS      Objective:  Physical Exam  Neurovascular status intact with dorsal discomfort of the extensor tendon group with inflammation fluid buildup that is local with mild Achilles tendon discomfort     Assessment:  H&P condition reviewed and today I did sheath injection injected the dorsal tendon complex 3 mg Dexasone Kenalog 5 mg Xylocaine and advised on wider shoes and reappoint to recheck     Plan:  Acute tendinitis that does probably relate to arthritic condition  X-ray indicates that there is no signs of of a progression of the condition or indications of stress fracture or advanced process

## 2018-09-30 ENCOUNTER — Telehealth: Payer: Self-pay | Admitting: *Deleted

## 2018-09-30 NOTE — Telephone Encounter (Signed)
Called and moved the patient's appt from 4/1 to 5/1 due to COVID-19

## 2018-10-08 ENCOUNTER — Ambulatory Visit: Payer: PPO | Admitting: Gynecologic Oncology

## 2018-10-24 ENCOUNTER — Ambulatory Visit: Payer: PPO | Admitting: Gynecologic Oncology

## 2018-11-03 ENCOUNTER — Telehealth: Payer: Self-pay | Admitting: *Deleted

## 2018-11-03 NOTE — Telephone Encounter (Signed)
Called and spoke with the patient regarding her appt on Friday. Patient was offered a phone visit and WebEx appt, patient declined. Patient moved appt to June.

## 2018-11-07 ENCOUNTER — Ambulatory Visit: Payer: PPO | Admitting: Gynecologic Oncology

## 2018-11-18 ENCOUNTER — Other Ambulatory Visit: Payer: Self-pay | Admitting: Podiatry

## 2018-11-18 DIAGNOSIS — M778 Other enthesopathies, not elsewhere classified: Secondary | ICD-10-CM

## 2018-11-19 ENCOUNTER — Telehealth: Payer: Self-pay | Admitting: Family Medicine

## 2018-11-19 ENCOUNTER — Other Ambulatory Visit: Payer: PPO

## 2018-11-19 DIAGNOSIS — E785 Hyperlipidemia, unspecified: Secondary | ICD-10-CM

## 2018-11-19 DIAGNOSIS — I10 Essential (primary) hypertension: Secondary | ICD-10-CM

## 2018-11-19 DIAGNOSIS — R7309 Other abnormal glucose: Secondary | ICD-10-CM

## 2018-11-19 NOTE — Telephone Encounter (Signed)
-----   Message from Ellamae Sia sent at 11/14/2018  2:30 PM EDT ----- Regarding: Lab orders for Thursday, 5.14.20 Lab orders, thanks

## 2018-11-20 ENCOUNTER — Other Ambulatory Visit: Payer: Self-pay

## 2018-11-20 ENCOUNTER — Ambulatory Visit (INDEPENDENT_AMBULATORY_CARE_PROVIDER_SITE_OTHER): Payer: PPO

## 2018-11-20 ENCOUNTER — Other Ambulatory Visit (INDEPENDENT_AMBULATORY_CARE_PROVIDER_SITE_OTHER): Payer: PPO

## 2018-11-20 DIAGNOSIS — I10 Essential (primary) hypertension: Secondary | ICD-10-CM | POA: Diagnosis not present

## 2018-11-20 DIAGNOSIS — E785 Hyperlipidemia, unspecified: Secondary | ICD-10-CM | POA: Diagnosis not present

## 2018-11-20 DIAGNOSIS — Z0000 Encounter for general adult medical examination without abnormal findings: Secondary | ICD-10-CM | POA: Diagnosis not present

## 2018-11-20 DIAGNOSIS — R7309 Other abnormal glucose: Secondary | ICD-10-CM

## 2018-11-20 LAB — CBC WITH DIFFERENTIAL/PLATELET
Basophils Absolute: 0 10*3/uL (ref 0.0–0.1)
Basophils Relative: 0.4 % (ref 0.0–3.0)
Eosinophils Absolute: 0.1 10*3/uL (ref 0.0–0.7)
Eosinophils Relative: 3.2 % (ref 0.0–5.0)
HCT: 36.5 % (ref 36.0–46.0)
Hemoglobin: 12.8 g/dL (ref 12.0–15.0)
Lymphocytes Relative: 26.6 % (ref 12.0–46.0)
Lymphs Abs: 1.1 10*3/uL (ref 0.7–4.0)
MCHC: 35 g/dL (ref 30.0–36.0)
MCV: 89.8 fl (ref 78.0–100.0)
Monocytes Absolute: 0.2 10*3/uL (ref 0.1–1.0)
Monocytes Relative: 5.4 % (ref 3.0–12.0)
Neutro Abs: 2.6 10*3/uL (ref 1.4–7.7)
Neutrophils Relative %: 64.4 % (ref 43.0–77.0)
Platelets: 205 10*3/uL (ref 150.0–400.0)
RBC: 4.06 Mil/uL (ref 3.87–5.11)
RDW: 13.2 % (ref 11.5–15.5)
WBC: 4 10*3/uL (ref 4.0–10.5)

## 2018-11-20 LAB — LIPID PANEL
Cholesterol: 181 mg/dL (ref 0–200)
HDL: 46.9 mg/dL (ref >39.00)
LDL Cholesterol: 105 mg/dL — ABNORMAL HIGH (ref 0–99)
NonHDL: 134.1
Total CHOL/HDL Ratio: 4
Triglycerides: 147 mg/dL (ref 0.0–149.0)
VLDL: 29.4 mg/dL (ref 0.0–40.0)

## 2018-11-20 LAB — COMPREHENSIVE METABOLIC PANEL
ALT: 16 U/L (ref 0–35)
AST: 21 U/L (ref 0–37)
Albumin: 4.5 g/dL (ref 3.5–5.2)
Alkaline Phosphatase: 53 U/L (ref 39–117)
BUN: 11 mg/dL (ref 6–23)
CO2: 30 mEq/L (ref 19–32)
Calcium: 9.2 mg/dL (ref 8.4–10.5)
Chloride: 104 mEq/L (ref 96–112)
Creatinine, Ser: 0.64 mg/dL (ref 0.40–1.20)
GFR: 90.78 mL/min (ref >60.00)
Glucose, Bld: 104 mg/dL — ABNORMAL HIGH (ref 70–99)
Potassium: 4.1 mEq/L (ref 3.5–5.1)
Sodium: 140 mEq/L (ref 135–145)
Total Bilirubin: 0.6 mg/dL (ref 0.2–1.2)
Total Protein: 7 g/dL (ref 6.0–8.3)

## 2018-11-20 LAB — HEMOGLOBIN A1C: Hgb A1c MFr Bld: 5.1 % (ref 4.6–6.5)

## 2018-11-20 LAB — TSH: TSH: 2.34 u[IU]/mL (ref 0.35–4.50)

## 2018-11-20 NOTE — Progress Notes (Signed)
PCP notes:   Health maintenance:  No gaps identified  Abnormal screenings:   Fall risk - hx of multiple falls Fall Risk  11/20/2018 01/16/2018 11/20/2017 11/07/2017 10/31/2017  Falls in the past year? 1 Yes Yes Yes Yes  Comment Several falls due to losing balance; soreness  - - fell in gym and fell at camping grounds; minor injuries -  Number falls in past yr: 1 2 or more 2 or more 2 or more 2 or more  Comment - - - - -  Injury with Fall? 1 Yes Yes Yes No  Risk Factor Category  - - High Fall Risk High Fall Risk High Fall Risk  Risk for fall due to : History of fall(s);Impaired balance/gait Other (Comment) History of fall(s);Impaired balance/gait Impaired balance/gait;History of fall(s) History of fall(s);Impaired balance/gait  Risk for fall due to: Comment - Pt tripped  - - -  Follow up - - Falls prevention discussed;Education provided - Falls prevention discussed;Education provided   Mini-Cog score: 16/17 MMSE - Mini Mental State Exam 11/20/2018 11/07/2017 11/02/2016 11/01/2015  Orientation to time 5 5 5 5   Orientation to Place 5 5 5 5   Registration 3 3 3 3   Attention/ Calculation 0 0 0 0  Recall 2 3 3 3   Recall-comments unable to recall 1 of 3 words - - -  Language- name 2 objects 0 0 0 0  Language- repeat 1 1 1 1   Language- follow 3 step command 0 3 3 3   Language- read & follow direction 0 0 0 0  Write a sentence 0 0 0 0  Copy design 0 0 0 0  Total score 16 20 20 20     Patient concerns:   None  Nurse concerns:  None  Next PCP appt:   11/25/18 @ 1015  I reviewed health advisor's note, was available for consultation, and agree with documentation and plan. Loura Pardon MD

## 2018-11-20 NOTE — Progress Notes (Signed)
Subjective:   Veronica Greene is a 73 y.o. female who presents for Medicare Annual (Subsequent) preventive examination.  Review of Systems:  N/A Cardiac Risk Factors include: advanced age (>68mn, >>63women);obesity (BMI >30kg/m2);dyslipidemia     Objective:     Vitals: There were no vitals taken for this visit.  There is no height or weight on file to calculate BMI.  Advanced Directives 11/20/2018 07/29/2018 07/17/2018 04/18/2018 01/16/2018 11/20/2017 11/07/2017  Does Patient Have a Medical Advance Directive? _0  Yes Yes  Type of AParamedicof ATroutLiving will Living will Living will HChevakLiving will HGrimeslandLiving will HOregonLiving will HCollege PlaceLiving will  Does patient want to make changes to medical advance directive? - - - - - No - Patient declined -  Copy of HY[place]onin Chart? No - copy requested - - No - copy requested No - copy requested No - copy requested No - copy requested    Tobacco Social History   Tobacco Use  Smoking Status Never Smoker  Smokeless Tobacco Never Used     Counseling given: No   Clinical Intake:  Pre-visit preparation completed: Yes  Pain : 0-10 Pain Score: 4  Pain Type: Chronic pain Pain Location: Knee(back) Pain Orientation: Left, Right     Nutritional Status: BMI > 30  Obese Nutritional Risks: None Diabetes: No  How often do you need to have someone help you when you read instructions, pamphlets, or other written materials from your doctor or pharmacy?: 1 - Never What is the last grade level you completed in school?: 12th grade  Interpreter Needed?: No  Comments: pt lives with spouse Information entered by :: LPinson, LPN  Past Medical History:  Diagnosis Date  . Allergic rhinitis   . Arthritis    knees  . Depression   . Depression with anxiety   . Endometrial cancer ([facility]     02/ 2019  Diagnosed with D&C/hysteroscopy with polypectomy  . Family history of colon cancer   . Family history of colonic polyps   . Fibromyalgia    tx mobic  . Full dentures   . GERD (gastroesophageal reflux disease)   . Gout   . Hearing loss    wears bilateral hearing aids  . Heart murmur   . History of colon polyps   . History of nonmelanoma skin cancer    right lower leg  . Hypertension   . IBS (irritable bowel syndrome)    with constipation  . Moderate aortic stenosis by prior echocardiogram 07/30/2017---  cardiologist-- dr harding   Previously diagnosed with murmur back in 2013. echo 07-30-2017 : Moderate AS with mild AR,  valve area 1.15cm^2,  Mean gradient 23 mmHg., peak gradient 436mg  . Osteopenia   . Polio age 441 months right leg weakness and 1.5 inches shorter than left leg.   . Skin cancer 2019   Right leg  . Urgency of urination   . UTI (urinary tract infection) 01/2018  . Wears hearing aid in both ears    Past Surgical History:  Procedure Laterality Date  . ANKLE SURGERY Right 1956  . CARPAL TUNNEL RELEASE Bilateral 1986, 1993  . COLONOSCOPY N/A 06/24/2015   Procedure: COLONOSCOPY;  Surgeon: CaGatha MayerMD;  Location: [facility];  Service: Endoscopy;  Laterality: N/A;  . DILATATION & CURETTAGE/HYSTEROSCOPY WITH MYOSURE N/A 08/09/2017   Procedure: DILATATION &  CURETTAGE/HYSTEROSCOPY WITH MYOSURE;  Surgeon: Christophe Louis, MD;  Location: [facility] [facility];  Service: Gynecology;  Laterality: N/A;  Polypectomy  . DILATION AND CURETTAGE OF UTERUS     PMB  . HAND SURGERY Bilateral 1999   Thumbs   . KNEE SURGERY Bilateral 1998   x2  . LYMPH NODE BIOPSY N/A 10/08/2017   Procedure: LYMPH NODE BIOPSY;  Surgeon: [name], MD;  Location: [facility];  Service: Gynecology;  Laterality: N/A;  . MULTIPLE TOOTH EXTRACTIONS    . NASAL SINUS SURGERY  1976  . polio surgery.     right foot - right leg 1.5" shorter than left leg - some wekness  . ROBOTIC ASSISTED TOTAL HYSTERECTOMY  WITH BILATERAL SALPINGO OOPHERECTOMY Bilateral 10/08/2017   Procedure: XI ROBOTIC ASSISTED TOTAL HYSTERECTOMY WITH BILATERAL SALPINGO OOPHORECTOMY;  Surgeon: [name], MD;  Location: [facility];  Service: Gynecology;  Laterality: Bilateral;  . THUMB ARTHROSCOPY  1999  . TONSILLECTOMY  1335   73 years old  . TRANSTHORACIC ECHOCARDIOGRAM  07/30/2017   mild LVH, ef 41-93%, grade 2 diastolic dysfunction/ moderately thickened and mild calcified AV leaflets with moderate stenosis & mild AR (valve area 1.15cm^2, mean gradiant 26mHg, peak grandient 430mg)/ mild LAE and RAE/ trivial TR  . TRANSTHORACIC ECHOCARDIOGRAM  08/29/2018   Mod LVH. Mod-Severe AS (mild AI) - mean gradient now 38 mmHg. Mild-Mod MAC. No MS.  --notable progression of disease  . TUBAL LIGATION    . UPPER GI ENDOSCOPY     normal  . WISDOM TOOTH EXTRACTION     Family History  Problem Relation Age of Onset  . Kidney disease Mother   . Alzheimer's disease Mother   . Stroke Father 7933. Colon cancer Father 703     possibly colon cancer, possibly just polyps  . Kidney disease Sister   . Heart attack Sister   . Colon cancer Paternal Aunt 7033. Cancer Paternal Uncle        unk type  . Esophageal cancer Neg Hx   . Stomach cancer Neg Hx    Social History   Socioeconomic History  . Marital status: Married    Spouse name: Not on file  . Number of children: 2  . Years of education: Not on file  . Highest education level: High school graduate  Occupational History  . Occupation: RECEPTION    Employer: [employer]; [employer]  Social Needs  . Financial resource strain: Not on file  . Food insecurity:    Worry: Not on file    Inability: Not on file  . Transportation needs:    Medical: Not on file    Non-medical: Not on file  Tobacco Use  . Smoking status: Never Smoker  . Smokeless tobacco: Never Used  Substance and Sexual Activity  . Alcohol use: No    Alcohol/week: 0.0 standard drinks   . Drug use: No  . Sexual activity: Yes    Birth control/protection: Post-menopausal  Lifestyle  . Physical activity:    Days per week: Not on file    Minutes per session: Not on file  . Stress: Not on file  Relationships  . Social connections:    Talks on phone: Not on file    Gets together: Not on file    Attends religious service: Not on file    Active member of club or organization: Not on file    Attends meetings of clubs or  organizations: Not on file    Relationship status: Not on file  Other Topics Concern  . Not on file  Social History Narrative   She is relatively recently remarried.  She has 2  children and 3 grandchildren.     She is currently retired.  But enjoys cleaning house and doing chores.  She likes to do yard work.  Does not routinely exercise.    Outpatient Encounter Medications as of 11/20/2018  Medication Sig  . acetaminophen (TYLENOL 8 HOUR ARTHRITIS PAIN) 650 MG CR tablet Take 650 mg by mouth daily as needed for pain.   Marland Kitchen acetic acid-hydrocortisone (ACETASOL HC) OTIC solution Place 2 drops into both ears 2 (two) times daily as needed (itching).  . Ascorbic Acid (VITAMIN C) 1000 MG tablet Take 1,000 mg by mouth daily with breakfast.   . Calcium-Vitamin D-Vitamin K (VIACTIV) 300-762-26 MG-UNT-MCG CHEW Chew 1 tablet by mouth 2 (two) times daily. Morning & afternoon  . cetirizine (ZYRTEC) 10 MG tablet Take 5 mg by mouth as needed for allergies (alternates between mucinex & zyrtec for sinus symptoms).   . cholecalciferol (VITAMIN D) 1000 UNITS tablet Take 1,000 Units by mouth daily with breakfast.   . esomeprazole (NEXIUM) 20 MG capsule Take 40 mg by mouth daily before breakfast.  . fluticasone (FLONASE) 50 MCG/ACT nasal spray Place 2 sprays into both nostrils at bedtime.   Marland Kitchen guaiFENesin (MUCINEX) 600 MG 12 hr tablet Take 600 mg by mouth daily.  . hydrochlorothiazide (HYDRODIURIL) 25 MG tablet Take 1 tablet (25 mg total) by mouth daily with lunch.  . ibuprofen  (ADVIL,MOTRIN) 800 MG tablet Take 1 tablet (800 mg total) by mouth every 8 (eight) hours as needed for moderate pain. With food  . lubiprostone (AMITIZA) 24 MCG capsule Take 1 capsule (24 mcg total) by mouth 2 (two) times daily. LUNCH & SUPPER  . meloxicam (MOBIC) 7.5 MG tablet Take 7.5 mg by mouth daily.  . Multiple Vitamin (MULTIVITAMIN WITH MINERALS) TABS tablet Take 1 tablet by mouth daily. WOMEN'S MULTIVITAMIN  . Polyethyl Glycol-Propyl Glycol (SYSTANE) 0.4-0.3 % SOLN Place 1 drop into both eyes 2 (two) times daily.  . potassium chloride (K-DUR) 10 MEQ tablet Take 2 tablets (20 mEq total) by mouth 2 (two) times daily.  . Simethicone (PHAZYME PO) Take 1 tablet by mouth 4 (four) times daily as needed (for gas (typically one tablet at night)).   . sodium chloride (AYR) 0.65 % nasal spray Place 1 spray into the nose 4 (four) times daily as needed for congestion.  . TURMERIC PO Take 1 tablet by mouth daily.  Marland Kitchen venlafaxine XR (EFFEXOR XR) 75 MG 24 hr capsule Take 1 capsule (75 mg total) by mouth daily after breakfast.   Facility-Administered Encounter Medications as of 11/20/2018  Medication  . 0.9 %  sodium chloride infusion    Activities of Daily Living In your present state of health, do you have any difficulty performing the following activities: 11/20/2018  Hearing? Y  Vision? N  Difficulty concentrating or making decisions? N  Walking or climbing stairs? N  Dressing or bathing? N  Doing errands, shopping? N  Preparing Food and eating ? N  Using the Toilet? N  In the past six months, have you accidently leaked urine? N  Do you have problems with loss of bowel control? N  Managing your Medications? N  Managing your Finances? N  Housekeeping or managing your Housekeeping? N  Some recent data might be hidden  Patient Care Team: Tower, [name], MD as PCP - General Ellyn Hack Leonie Green, MD as PCP - Cardiology (Cardiology) Regal, Tamala Fothergill, DPM as Consulting Physician (Podiatry)  Ninetta Lights, MD as Consulting Physician (Orthopedic Surgery) Jerrell Belfast, MD as Consulting Physician (Otolaryngology) Lavella Hammock, DDS as Consulting Physician (Dentistry) Macarthur Critchley, OD as Referring Physician (Optometry)    Assessment:   This is a routine wellness examination for Laurana.  Hearing Screening Comments: Bilateral hearing aids Vision Screening Comments: Vision exam in Jan 2020 with Dr. Nicki Reaper  Exercise Activities and Dietary recommendations Current Exercise Habits: The patient does not participate in regular exercise at present, Exercise limited by: None identified  Goals    . DIET - INCREASE WATER INTAKE     Starting 11/07/2017, I will continue to drink 3-4 12 oz bottles of water daily.        Fall Risk Fall Risk  11/20/2018 01/16/2018 11/20/2017 11/07/2017 10/31/2017  Falls in the past year? 1 Yes Yes Yes Yes  Comment Several falls due to losing balance; soreness  - - fell in gym and fell at camping grounds; minor injuries -  Number falls in past yr: 1 2 or more 2 or more 2 or more 2 or more  Comment - - - - -  Injury with Fall? 1 Yes Yes Yes No  Risk Factor Category  - - High Fall Risk High Fall Risk High Fall Risk  Risk for fall due to : History of fall(s);Impaired balance/gait Other (Comment) History of fall(s);Impaired balance/gait Impaired balance/gait;History of fall(s) History of fall(s);Impaired balance/gait  Risk for fall due to: Comment - Pt tripped  - - -  Follow up - - Falls prevention discussed;Education provided - Falls prevention discussed;Education provided   Depression Screen PHQ 2/9 Scores 11/20/2018 11/20/2017 11/07/2017 10/31/2017  PHQ - 2 Score 0 _0 PHQ- 9 Score 0 8 10 -     Cognitive Function MMSE - Mini Mental State Exam 11/20/2018 11/07/2017 11/02/2016 11/01/2015  Orientation to time _1 Orientation to Place _2 Registration _3 Attention/ Calculation 0 0 0 0  Recall _4 Recall-comments unable to recall 1 of 3 words -  - -  Language- name 2 objects 0 0 0 0  Language- repeat _5 Language- follow 3 step command 0 _6 Language- read & follow direction 0 0 0 0  Write a sentence 0 0 0 0  Copy design 0 0 0 0  Total score _7 PLEASE NOTE: A Mini-Cog screen was completed. Maximum score is 17. A value of 0 denotes this part of Folstein MMSE was not completed or the patient failed this part of the Mini-Cog screening.   Mini-Cog Screening Orientation to Time - Max 5 pts Orientation to Place - Max 5 pts Registration - Max 3 pts Recall - Max 3 pts Language Repeat - Max 1 pts      Immunization History  Administered Date(s) Administered  . Influenza Whole 04/08/2008, 06/30/2009  . Influenza, High Dose Seasonal PF 05/17/2015, 05/07/2016, 05/07/2017, 04/22/2018  . Influenza-Unspecified 05/09/2014  . Pneumococcal Conjugate-13 10/12/2014  . Pneumococcal Polysaccharide-23 08/25/2010  . Td 10/15/2003  . Tdap 06/18/2018  . Zoster 08/12/2008    Screening Tests Health Maintenance  Topic Date Due  . INFLUENZA VACCINE  02/07/2019  . MAMMOGRAM  05/08/2019  . COLONOSCOPY  05/21/2019  . TETANUS/TDAP  06/18/2028  . DEXA SCAN  Completed  . Hepatitis C Screening  Completed  . PNA vac Low Risk Adult  Completed      Plan:     I have personally reviewed, addressed, and noted the following in the patient's chart:  A. Medical and social history B. Use of alcohol, tobacco or illicit drugs  C. Current medications and supplements D. Functional ability and status E.  Nutritional status F.  Physical activity G. Advance directives H. List of other physicians I.  Hospitalizations, surgeries, and ER visits in previous 12 months J.  Vitals (unless it is a telemedicine encounter) K. Screenings to include cognitive, depression, hearing, vision (NOTE: hearing and vision screenings not completed in telemedicine encounter) L. Referrals and appointments   In addition, I have reviewed and discussed with  patient certain preventive protocols, quality metrics, and best practice recommendations. A written personalized care plan for preventive services and recommendations were provided to patient.  With patient's permission, we connected on 11/20/18 at  8:30 AM EDT by a video enabled telemedicine application. Two patient identifiers were used to ensure the encounter occurred with the correct person.    Patient was in home and writer was in office.   Signed,   Lindell Noe, MHA, BS, LPN Health Coach

## 2018-11-25 ENCOUNTER — Encounter: Payer: Self-pay | Admitting: Family Medicine

## 2018-11-25 ENCOUNTER — Ambulatory Visit (INDEPENDENT_AMBULATORY_CARE_PROVIDER_SITE_OTHER): Payer: PPO | Admitting: Family Medicine

## 2018-11-25 VITALS — Wt 183.0 lb

## 2018-11-25 DIAGNOSIS — F329 Major depressive disorder, single episode, unspecified: Secondary | ICD-10-CM | POA: Diagnosis not present

## 2018-11-25 DIAGNOSIS — C541 Malignant neoplasm of endometrium: Secondary | ICD-10-CM

## 2018-11-25 DIAGNOSIS — E785 Hyperlipidemia, unspecified: Secondary | ICD-10-CM | POA: Diagnosis not present

## 2018-11-25 DIAGNOSIS — F413 Other mixed anxiety disorders: Secondary | ICD-10-CM

## 2018-11-25 DIAGNOSIS — I10 Essential (primary) hypertension: Secondary | ICD-10-CM

## 2018-11-25 DIAGNOSIS — M8580 Other specified disorders of bone density and structure, unspecified site: Secondary | ICD-10-CM | POA: Diagnosis not present

## 2018-11-25 DIAGNOSIS — R7309 Other abnormal glucose: Secondary | ICD-10-CM

## 2018-11-25 DIAGNOSIS — F419 Anxiety disorder, unspecified: Secondary | ICD-10-CM

## 2018-11-25 DIAGNOSIS — K581 Irritable bowel syndrome with constipation: Secondary | ICD-10-CM | POA: Diagnosis not present

## 2018-11-25 DIAGNOSIS — Z6830 Body mass index (BMI) 30.0-30.9, adult: Secondary | ICD-10-CM

## 2018-11-25 DIAGNOSIS — K219 Gastro-esophageal reflux disease without esophagitis: Secondary | ICD-10-CM

## 2018-11-25 DIAGNOSIS — E6609 Other obesity due to excess calories: Secondary | ICD-10-CM

## 2018-11-25 DIAGNOSIS — R2689 Other abnormalities of gait and mobility: Secondary | ICD-10-CM | POA: Insufficient documentation

## 2018-11-25 HISTORY — DX: Other abnormalities of gait and mobility: R26.89

## 2018-11-25 MED ORDER — VENLAFAXINE HCL ER 75 MG PO CP24: 75.0000 mg | ORAL_CAPSULE | Freq: Every day | ORAL | 3 refills | Status: DC

## 2018-11-25 MED ORDER — POTASSIUM CHLORIDE ER 10 MEQ PO TBCR: 20.0000 meq | EXTENDED_RELEASE_TABLET | Freq: Two times a day (BID) | ORAL | 3 refills | Status: DC

## 2018-11-25 MED ORDER — HYDROCHLOROTHIAZIDE 25 MG PO TABS: 25.0000 mg | ORAL_TABLET | Freq: Every day | ORAL | 3 refills | Status: DC

## 2018-11-25 MED ORDER — LUBIPROSTONE 24 MCG PO CAPS: 24.0000 ug | ORAL_CAPSULE | Freq: Two times a day (BID) | ORAL | 3 refills | Status: DC

## 2018-11-25 NOTE — Assessment & Plan Note (Signed)
Continues amitiza which helps  Also healthy diet

## 2018-11-25 NOTE — Assessment & Plan Note (Signed)
Continues nexium -has not been able to tolerate not taking it  Also protects from nsaids when she needs them

## 2018-11-25 NOTE — Assessment & Plan Note (Signed)
bp in fair control at this time  BP Readings from Last 1 Encounters:  09/02/18 (!) 128/54  enc her to check at home if she is able No changes needed Most recent labs reviewed  Disc lifstyle change with low sodium diet and exercise

## 2018-11-25 NOTE — Assessment & Plan Note (Signed)
Pt has upcoming f/u in June with oncology She is nervous about other appts cancelled due to the pandemic No clinical changes however

## 2018-11-25 NOTE — Progress Notes (Signed)
Virtual Visit via Video Note  I connected with Veronica Greene on 11/25/18 at 10:15 AM EDT by a video enabled telemedicine application and verified that I am speaking with the correct person using two identifiers.  Location: Patient: home Provider: office   Video did not work today so the rest of the call was done by phone    I discussed the limitations of evaluation and management by telemedicine and the availability of in person appointments. The patient expressed understanding and agreed to proceed.  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  Some yard work  She needs low impact and wants to get legs stronger   Mammogram 10/19 Self breast exam - no lumps  Colonoscopy 11/19 -multiple adenomas with 1 y recall  Father had colon cancer as well as paternal aunt in their 51s  dexa 10/18 - BMD in normal range No fractures  Taking her D and ca   Wt Readings from Last 3 Encounters:  09/02/18 183 lb (83 kg)  07/29/18 184 lb (83.5 kg)  07/17/18 184 lb 6.4 oz (83.6 kg)  she does not think it has changed at all 183lb  Watching what she eats    HTN No cp or palpitations or headaches or edema  No side effects to medicines  BP Readings from Last 3 Encounters:  09/02/18 (!) 128/54  07/29/18 (!) 173/72  07/17/18 (!) 151/76    She has not checked her bp     Lab Results  Component Value Date   CREATININE 0.64 11/20/2018   BUN 11 11/20/2018   NA 140 11/20/2018   K 4.1 11/20/2018   CL 104 11/20/2018   CO2 30 11/20/2018   Lab Results  Component Value Date   ALT 16 11/20/2018   AST 21 11/20/2018   ALKPHOS 53 11/20/2018   BILITOT 0.6 11/20/2018   Lab Results  Component Value  Date   WBC 4.0 11/20/2018   HGB 12.8 11/20/2018   HCT 36.5 11/20/2018   MCV 89.8 11/20/2018   PLT 205.0 11/20/2018      takes nexium daily 40 mg for GERD This is still working   IBS constipation Takes amitiza  Controls with diet also   Past h/o endometrial cancer  Surgery/rad tx in 2019 Has oncol appt 6/5    Anxiety with depression -well controlled  Takes effexor xr 75 mg   H/o elevated glucose Lab Results  Component Value Date   HGBA1C 5.1 11/20/2018   Hyperlipidemia Lab Results  Component Value Date   CHOL 181 11/20/2018   CHOL 170 11/07/2017   CHOL 153 11/02/2016   Lab Results  Component Value Date   HDL 46.90 11/20/2018   HDL 54.10 11/07/2017   HDL 37.70 (L) 11/02/2016   Lab Results  Component Value Date   LDLCALC 105 (H) 11/20/2018   [place] 90 11/07/2017   LDLCALC 90 11/02/2016   Lab Results  Component Value Date   TRIG 147.0 11/20/2018   TRIG 129.0 11/07/2017   TRIG 127.0 11/02/2016   Lab Results  Component Value Date   CHOLHDL 4 11/20/2018   CHOLHDL 3 11/07/2017   CHOLHDL 4 11/02/2016  No results found for: LDLDIRECT   Diet has not changed much  She has had to do more cooking  (husband is picky)- and he is diabetic so they eat fairly healthy   Review of Systems  Constitutional: Negative for chills, fever, malaise/fatigue and weight loss.  HENT: Negative for hearing loss and sore throat.   Eyes: Negative for blurred vision.  Respiratory: Negative for cough and shortness of breath.   Cardiovascular: Negative for chest pain, palpitations and leg swelling.  Gastrointestinal: Negative for blood in stool, heartburn and nausea.  Musculoskeletal: Negative for myalgias.  Skin: Negative for itching and rash.  Neurological: Positive for weakness. Negative for dizziness, sensory change, focal weakness, loss of consciousness and headaches.       Poor balance with falls  Weak generally in legs-needs to get stronger   Psychiatric/Behavioral:  Negative for depression and memory loss. The patient does not have insomnia.        Well controlled anxiety and depression    Patient Active Problem List   Diagnosis Date Noted  . Poor balance 11/25/2018  . Genetic testing 06/23/2018  . Family history of colon cancer   . Family history of colonic polyps   . Right groin pain 04/22/2018  . Right hip pain 04/22/2018  . Fall at home 04/22/2018  . Elevated random blood glucose level 11/11/2017  . Endometrial cancer ([place]) 09/12/2017  . Essential hypertension 08/29/2017  . Moderate calcific aortic stenosis 08/27/2017  . Estrogen deficiency 11/05/2016  . Routine general medical examination at a health care facility 10/15/2015  . Encounter for Medicare annual wellness exam 09/30/2013  . Back pain 02/16/2013  . Hyperlipidemia, mild 09/02/2012  . Other screening mammogram 12/12/2011  . Degenerative disk disease 08/31/2011  . Hypokalemia 01/02/2011  . Personal history of colonic polyps 11/03/2010  . Constipation, slow transit 11/03/2010  . Melanosis coli 11/03/2010  . Gout 08/25/2010  . DERMATITIS, SEBORRHEIC 08/25/2010  . Osteopenia 09/05/2007  . History of poliomyelitis 08/26/2007  . Obesity 08/26/2007  . Anxiety and depression 08/26/2007  . ALLERGIC RHINITIS 08/26/2007  . GERD 08/26/2007  . IBS 08/26/2007  . OVERACTIVE BLADDER 08/26/2007  . OSTEOARTHRITIS, HANDS, BILATERAL 08/26/2007  . FIBROMYALGIA 08/26/2007  . EDEMA, CHRONIC 08/26/2007  . URINARY INCONTINENCE 08/26/2007  . HEMATURIA, HX OF 08/26/2007   Past Medical History:  Diagnosis Date  . Allergic rhinitis   . Arthritis    knees  . Depression   . Depression with anxiety   . Endometrial cancer [facility])    02/ 2019  Diagnosed with D&C/hysteroscopy with polypectomy  . Family history of colon cancer   . Family history of colonic polyps   . Fibromyalgia    tx mobic  . Full dentures   . GERD (gastroesophageal reflux disease)   . Gout   . Hearing loss    wears  bilateral hearing aids  . Heart murmur   . History of colon polyps   . History of nonmelanoma skin cancer    right lower leg  . Hypertension   . IBS (irritable bowel syndrome)    with constipation  . Moderate aortic stenosis by prior echocardiogram 07/30/2017---  cardiologist-- dr harding   Previously diagnosed with murmur back in 2013. echo 07-30-2017 : Moderate AS with mild AR,  valve area 1.15cm^2,  Mean gradient 23 mmHg., peak gradient 86mHg  . Osteopenia   . Polio age 74 months  right leg weakness and 1.5 inches shorter than left leg.   .Marland Kitchen  Skin cancer 2019   Right leg  . Urgency of urination   . UTI (urinary tract infection) 01/2018  . Wears hearing aid in both ears    Past Surgical History:  Procedure Laterality Date  . ANKLE SURGERY Right 1956  . CARPAL TUNNEL RELEASE Bilateral 1986, 1993  . COLONOSCOPY N/A 06/24/2015   Procedure: COLONOSCOPY;  Surgeon: Gatha Mayer, MD;  Location: [facility];  Service: Endoscopy;  Laterality: N/A;  . DILATATION & CURETTAGE/HYSTEROSCOPY WITH MYOSURE N/A 08/09/2017   Procedure: DILATATION & CURETTAGE/HYSTEROSCOPY WITH MYOSURE;  Surgeon: Christophe Louis, MD;  Location: [facility];  Service: Gynecology;  Laterality: N/A;  Polypectomy  . DILATION AND CURETTAGE OF UTERUS     PMB  . HAND SURGERY Bilateral 1999   Thumbs   . KNEE SURGERY Bilateral 1998   x2  . LYMPH NODE BIOPSY N/A 10/08/2017   Procedure: LYMPH NODE BIOPSY;  Surgeon: [name], MD;  Location: [facility];  Service: Gynecology;  Laterality: N/A;  . MULTIPLE TOOTH EXTRACTIONS    . NASAL SINUS SURGERY  1976  . polio surgery.     right foot - right leg 1.5" shorter than left leg - some wekness  . ROBOTIC ASSISTED TOTAL HYSTERECTOMY WITH BILATERAL SALPINGO OOPHERECTOMY Bilateral 10/08/2017   Procedure: XI ROBOTIC ASSISTED TOTAL HYSTERECTOMY WITH BILATERAL SALPINGO OOPHORECTOMY;  Surgeon: [name], MD;  Location: [facility];  Service: Gynecology;  Laterality: Bilateral;  . THUMB ARTHROSCOPY  1999   . TONSILLECTOMY  4875   73 years old  . TRANSTHORACIC ECHOCARDIOGRAM  07/30/2017   mild LVH, ef 08-67%, grade 2 diastolic dysfunction/ moderately thickened and mild calcified AV leaflets with moderate stenosis & mild AR (valve area 1.15cm^2, mean gradiant 56mHg, peak grandient 41mg)/ mild LAE and RAE/ trivial TR  . TRANSTHORACIC ECHOCARDIOGRAM  08/29/2018   Mod LVH. Mod-Severe AS (mild AI) - mean gradient now 38 mmHg. Mild-Mod MAC. No MS.  --notable progression of disease  . TUBAL LIGATION    . UPPER GI ENDOSCOPY     normal  . WISDOM TOOTH EXTRACTION     Social History   Tobacco Use  . Smoking status: Never Smoker  . Smokeless tobacco: Never Used  Substance Use Topics  . Alcohol use: No    Alcohol/week: 0.0 standard drinks  . Drug use: No   Family History  Problem Relation Age of Onset  . Kidney disease Mother   . Alzheimer's disease Mother   . Stroke Father [id number]. Colon cancer Father [id number]     possibly colon cancer, possibly just polyps  . Kidney disease Sister   . Heart attack Sister   . Colon cancer Paternal Aunt [id number]. Cancer Paternal Uncle        unk type  . Esophageal cancer Neg Hx   . Stomach cancer Neg Hx    Allergies  Allergen Reactions  . Clarithromycin Swelling    throat swells  . Codeine Nausea And Vomiting  . Paroxetine Other (See Comments)    Ineffective.  . Marland Kitchenenicillins Hives    welts Has patient had a PCN reaction causing immediate rash, facial/tongue/throat swelling, SOB or lightheadedness with hypotension: No Has patient had a PCN reaction causing severe rash involving mucus membranes or skin necrosis: Unknown Has patient had a PCN reaction that required hospitalization:No Has patient had a PCN reaction occurring within the last 10 years: No If all of the above answers are "NO", then may proceed with Cephalosporin use.  Current Outpatient Medications on File Prior to Visit  Medication Sig Dispense Refill  . acetaminophen (TYLENOL 8 HOUR  ARTHRITIS PAIN) 650 MG CR tablet Take 650 mg by mouth daily as needed for pain.     Marland Kitchen acetic acid-hydrocortisone (ACETASOL HC) OTIC solution Place 2 drops into both ears 2 (two) times daily as needed (itching).    . Ascorbic Acid (VITAMIN C) 1000 MG tablet Take 1,000 mg by mouth daily with breakfast.     . Calcium-Vitamin D-Vitamin K (VIACTIV) 163-846-65 MG-UNT-MCG CHEW Chew 1 tablet by mouth 2 (two) times daily. Morning & afternoon    . cetirizine (ZYRTEC) 10 MG tablet Take 5 mg by mouth as needed for allergies (alternates between mucinex & zyrtec for sinus symptoms).     . cholecalciferol (VITAMIN D) 1000 UNITS tablet Take 1,000 Units by mouth daily with breakfast.     . esomeprazole (NEXIUM) 20 MG capsule Take 40 mg by mouth daily before breakfast.    . fluticasone (FLONASE) 50 MCG/ACT nasal spray Place 2 sprays into both nostrils at bedtime.     Marland Kitchen guaiFENesin (MUCINEX) 600 MG 12 hr tablet Take 600 mg by mouth daily.    Marland Kitchen ibuprofen (ADVIL,MOTRIN) 800 MG tablet Take 1 tablet (800 mg total) by mouth every 8 (eight) hours as needed for moderate pain. With food 30 tablet 0  . meloxicam (MOBIC) 7.5 MG tablet Take 7.5 mg by mouth daily.    . Multiple Vitamin (MULTIVITAMIN WITH MINERALS) TABS tablet Take 1 tablet by mouth daily. WOMEN'S MULTIVITAMIN    . Polyethyl Glycol-Propyl Glycol (SYSTANE) 0.4-0.3 % SOLN Place 1 drop into both eyes 2 (two) times daily.    . Simethicone (PHAZYME PO) Take 1 tablet by mouth 4 (four) times daily as needed (for gas (typically one tablet at night)).     . sodium chloride (AYR) 0.65 % nasal spray Place 1 spray into the nose 4 (four) times daily as needed for congestion.    . TURMERIC PO Take 1 tablet by mouth daily.     No current facility-administered medications on file prior to visit.      Observations/Objective: Patient sounds like her usual self  Not hoarse/normal voice  No distress  Normal affect-not anxious or depressed sounding  Pleasant and talkative    Assessment and Plan: Problem List Items Addressed This Visit      Cardiovascular and Mediastinum   Essential hypertension (Chronic)    bp in fair control at this time  BP Readings from Last 1 Encounters:  09/02/18 (!) 128/54  enc her to check at home if she is able No changes needed Most recent labs reviewed  Disc lifstyle change with low sodium diet and exercise        Relevant Medications   hydrochlorothiazide (HYDRODIURIL) 25 MG tablet     Digestive   GERD - Primary    Continues nexium -has not been able to tolerate not taking it  Also protects from nsaids when she needs them      Relevant Medications   lubiprostone (AMITIZA) 24 MCG capsule   IBS    Continues amitiza which helps  Also healthy diet       Relevant Medications   lubiprostone (AMITIZA) 24 MCG capsule     Musculoskeletal and Integument   Osteopenia    Last dexa -normal BMD Has had falls but no fx Taking ca and D          Genitourinary   Endometrial cancer ([place])  Pt has upcoming f/u in June with oncology She is nervous about other appts cancelled due to the pandemic No clinical changes however         Other   Hyperlipidemia, mild (Chronic)    Cholesterol up very slightly with LDL of 105 Disc goals for lipids and reasons to control them Rev last labs with pt Rev low sat fat diet in detail       Relevant Medications   hydrochlorothiazide (HYDRODIURIL) 25 MG tablet   Obesity   Anxiety and depression    Continues to be stable on effexor-as long as she continues to take it  Some anxiety re: recent cancer diagnosis Good self care  Enc her to add exercise when safe to do so      Relevant Medications   venlafaxine XR (EFFEXOR XR) 75 MG 24 hr capsule   Elevated random blood glucose level    Good A1C Lab Results  Component Value Date   HGBA1C 5.1 11/20/2018   disc imp of low glycemic diet and wt loss to prevent DM2       Poor balance    With age this has become more of a  problem  Leg length shorter on R (from polio)- has lifted shoe which also caused balance issues Several falls in the home Enc her to use assistive device if needed  Also ref to PT to eval and tx for balance /LE strength       Relevant Orders   Ambulatory referral to Physical Therapy    Other Visit Diagnoses    Other mixed anxiety disorders       Relevant Medications   venlafaxine XR (EFFEXOR XR) 75 MG 24 hr capsule       Follow Up Instructions: Use a walking device in the house if you need it  We will refer you PT for balance and strength issues  Our office will call about that   Try to fit in extra exercise  Cholesterol is up a bit Avoid red meat/ fried foods/ egg yolks/ fatty breakfast meats/ butter, cheese and high fat dairy/ and shellfish     I discussed the assessment and treatment plan with the patient. The patient was provided an opportunity to ask questions and all were answered. The patient agreed with the plan and demonstrated an understanding of the instructions.   The patient was advised to call back or seek an in-person evaluation if the symptoms worsen or if the condition fails to improve as anticipated.  I provided 25 minutes of non-face-to-face time during this encounter.   Loura Pardon, MD

## 2018-11-25 NOTE — Assessment & Plan Note (Signed)
Good A1C Lab Results  Component Value Date   HGBA1C 5.1 11/20/2018   disc imp of low glycemic diet and wt loss to prevent DM2

## 2018-11-25 NOTE — Patient Instructions (Addendum)
Use a walking device in the house if you need it  We will refer you PT for balance and strength issues  Our office will call about that   Try to fit in extra exercise  Cholesterol is up a bit Avoid red meat/ fried foods/ egg yolks/ fatty breakfast meats/ butter, cheese and high fat dairy/ and shellfish

## 2018-11-25 NOTE — Assessment & Plan Note (Signed)
Continues to be stable on effexor-as long as she continues to take it  Some anxiety re: recent cancer diagnosis Good self care  Enc her to add exercise when safe to do so

## 2018-11-25 NOTE — Assessment & Plan Note (Signed)
Cholesterol up very slightly with LDL of 105 Disc goals for lipids and reasons to control them Rev last labs with pt Rev low sat fat diet in detail

## 2018-11-25 NOTE — Assessment & Plan Note (Signed)
With age this has become more of a problem  Leg length shorter on R (from polio)- has lifted shoe which also caused balance issues Several falls in the home Enc her to use assistive device if needed  Also ref to PT to eval and tx for balance /LE strength

## 2018-11-25 NOTE — Assessment & Plan Note (Addendum)
Last dexa -normal BMD Has had falls but no fx Taking ca and D

## 2018-11-28 ENCOUNTER — Encounter: Payer: PPO | Admitting: Family Medicine

## 2018-12-03 DIAGNOSIS — M6281 Muscle weakness (generalized): Secondary | ICD-10-CM | POA: Diagnosis not present

## 2018-12-03 DIAGNOSIS — Z9181 History of falling: Secondary | ICD-10-CM | POA: Diagnosis not present

## 2018-12-03 DIAGNOSIS — B91 Sequelae of poliomyelitis: Secondary | ICD-10-CM | POA: Diagnosis not present

## 2018-12-09 DIAGNOSIS — B91 Sequelae of poliomyelitis: Secondary | ICD-10-CM | POA: Diagnosis not present

## 2018-12-09 DIAGNOSIS — Z9181 History of falling: Secondary | ICD-10-CM | POA: Diagnosis not present

## 2018-12-09 DIAGNOSIS — M6281 Muscle weakness (generalized): Secondary | ICD-10-CM | POA: Diagnosis not present

## 2018-12-10 ENCOUNTER — Telehealth: Payer: Self-pay | Admitting: *Deleted

## 2018-12-10 NOTE — Telephone Encounter (Signed)
Returned the patient's call and explained the procedure for her appt on Friday.

## 2018-12-12 ENCOUNTER — Other Ambulatory Visit: Payer: Self-pay

## 2018-12-12 ENCOUNTER — Encounter: Payer: Self-pay | Admitting: Gynecologic Oncology

## 2018-12-12 ENCOUNTER — Inpatient Hospital Stay: Payer: PPO | Attending: Gynecologic Oncology | Admitting: Gynecologic Oncology

## 2018-12-12 VITALS — BP 151/58 | HR 62 | Temp 97.9°F | Resp 18 | Ht 67.0 in | Wt 180.9 lb

## 2018-12-12 DIAGNOSIS — I350 Nonrheumatic aortic (valve) stenosis: Secondary | ICD-10-CM | POA: Insufficient documentation

## 2018-12-12 DIAGNOSIS — Z90722 Acquired absence of ovaries, bilateral: Secondary | ICD-10-CM | POA: Diagnosis not present

## 2018-12-12 DIAGNOSIS — C541 Malignant neoplasm of endometrium: Secondary | ICD-10-CM | POA: Diagnosis not present

## 2018-12-12 DIAGNOSIS — F418 Other specified anxiety disorders: Secondary | ICD-10-CM | POA: Diagnosis not present

## 2018-12-12 DIAGNOSIS — K219 Gastro-esophageal reflux disease without esophagitis: Secondary | ICD-10-CM | POA: Insufficient documentation

## 2018-12-12 DIAGNOSIS — K589 Irritable bowel syndrome without diarrhea: Secondary | ICD-10-CM | POA: Insufficient documentation

## 2018-12-12 DIAGNOSIS — M8580 Other specified disorders of bone density and structure, unspecified site: Secondary | ICD-10-CM | POA: Insufficient documentation

## 2018-12-12 DIAGNOSIS — Z90710 Acquired absence of both cervix and uterus: Secondary | ICD-10-CM | POA: Insufficient documentation

## 2018-12-12 DIAGNOSIS — M797 Fibromyalgia: Secondary | ICD-10-CM | POA: Insufficient documentation

## 2018-12-12 DIAGNOSIS — Z923 Personal history of irradiation: Secondary | ICD-10-CM | POA: Diagnosis not present

## 2018-12-12 DIAGNOSIS — I10 Essential (primary) hypertension: Secondary | ICD-10-CM | POA: Diagnosis not present

## 2018-12-12 NOTE — Progress Notes (Signed)
Follow-up Note: Gyn-Onc  Consult was requested by Dr. Landry Mellow for the evaluation of Veronica Greene 73 y.o. female  CC:  Chief Complaint  Patient presents with  . Endometrial cancer [facility])    Assessment/Plan:  Veronica Greene  is a 73 y.o.  year old with stage IB grade 2 endometrioid endometrial adenocarcinoma (MSI high/MMR normal).  High/intermediate risk factors for recurrence. s/p vaginal brachytherapy completed 12/19/17.  CT abd/pelvis to evaluate for recurrent disease given the pain in the RLQ.  I recommend she follow-up at 3 monthly intervals for symptom review, physical examination and pelvic examination. Pap smear is not recommended in routine endometrial cancer surveillance. After 2 years we will space these visits to every 6 months, and then annually if recurrence has not developed within 5 years. All questions were answered.  HPI: Veronica Greene is a 73 year old parous woman who is seen in consultation at the request of Dr Landry Mellow for grade 1 endometrial cancer.  She first began vaginal spotting in July 2017.  She was seen for an annual visit with Dr. Landry Mellow in October 2018 and reported this symptom.  A transvaginal ultrasound scan was performed on June 25, 2017 and revealed a normal-sized uterus measuring 7 x 4 x 2.7 cm with normal ovaries bilaterally.  The endometrial thickness was 1.28 cm.  On saline infusion the endometrial hyperechoic mass measured 7 mm. The tumor was MSI high, MMR normal.  She had MLH1 promotor methylation in her tumor but is negative for Lynch syndrome on genetic testing.   She was noted to have a systolic cardiac murmur and was seen by cardiology who performed a transthoracic echo on July 30, 2017.  This revealed moderate aortic stenosis.  The left ventricle was grossly normal in cavity size, though with some increased ventricular wall thickness.  Ejection fraction was 60-65%.  She was cleared for surgical procedure.  Of note she was asymptomatic  from this aortic stenosis.  She has a remote history of rheumatic heart disease as a child.  On August 09, 2017 she underwent a hysteroscopy D&C.  Final pathology confirmed a FIGO grade 1 endometrioid endometrial adenocarcinoma.  The patient is otherwise very healthy.  She has had no prior abdominal surgeries.  Interval Hx:  On 10/08/17 she underwent robotic assisted total hysterectomy, BSO and SLN biopsy. Final pathology revealed a 3.2cm grade 2 endometrioid endometrial adenocarcinoma with outer half myo invasion (1 of 1.2cm) and no LVSI. All SLN's were negative as were adnexa and cervix. MSI pending. She was determined to have high/intermediate risk factors and was recommended to have vaginal brachytherapy in accordance with NCCN guidelines.  She completed vaginal brachytherapy with 30Gy in 5 fractions on 12/19/17. She has persistent right lower quadrant intermittent pains.    Current Meds:  Outpatient Encounter Medications as of 12/12/2018  Medication Sig  . acetaminophen (TYLENOL 8 HOUR ARTHRITIS PAIN) 650 MG CR tablet Take 650 mg by mouth daily as needed for pain.   Marland Kitchen acetic acid-hydrocortisone (ACETASOL HC) OTIC solution Place 2 drops into both ears 2 (two) times daily as needed (itching).  . Ascorbic Acid (VITAMIN C) 1000 MG tablet Take 1,000 mg by mouth daily with breakfast.   . Calcium-Vitamin D-Vitamin K (VIACTIV) 035-597-41 MG-UNT-MCG CHEW Chew 1 tablet by mouth 2 (two) times daily. Morning & afternoon  . cetirizine (ZYRTEC) 10 MG tablet Take 5 mg by mouth as needed for allergies (alternates between mucinex & zyrtec for sinus symptoms).   . cholecalciferol (VITAMIN  D) 1000 UNITS tablet Take 1,000 Units by mouth daily with breakfast.   . esomeprazole (NEXIUM) 20 MG capsule Take 40 mg by mouth daily before breakfast.  . fluticasone (FLONASE) 50 MCG/ACT nasal spray Place 2 sprays into both nostrils at bedtime.   Marland Kitchen guaiFENesin (MUCINEX) 600 MG 12 hr tablet Take 600 mg by mouth daily.  .  hydrochlorothiazide (HYDRODIURIL) 25 MG tablet Take 1 tablet (25 mg total) by mouth daily with lunch.  . lubiprostone (AMITIZA) 24 MCG capsule Take 1 capsule (24 mcg total) by mouth 2 (two) times daily. LUNCH & SUPPER  . meloxicam (MOBIC) 7.5 MG tablet Take 7.5 mg by mouth daily.  . Multiple Vitamin (MULTIVITAMIN WITH MINERALS) TABS tablet Take 1 tablet by mouth daily. WOMEN'S MULTIVITAMIN  . Polyethyl Glycol-Propyl Glycol (SYSTANE) 0.4-0.3 % SOLN Place 1 drop into both eyes 2 (two) times daily.  . potassium chloride (K-DUR) 10 MEQ tablet Take 2 tablets (20 mEq total) by mouth 2 (two) times daily.  . Simethicone (PHAZYME PO) Take 1 tablet by mouth 4 (four) times daily as needed (for gas (typically one tablet at night)).   . sodium chloride (AYR) 0.65 % nasal spray Place 1 spray into the nose 4 (four) times daily as needed for congestion.  . TURMERIC PO Take 1 tablet by mouth daily.  Marland Kitchen venlafaxine XR (EFFEXOR XR) 75 MG 24 hr capsule Take 1 capsule (75 mg total) by mouth daily after breakfast.   No facility-administered encounter medications on file as of 12/12/2018.     Allergy:  Allergies  Allergen Reactions  . Clarithromycin Swelling    throat swells  . Codeine Nausea And Vomiting  . Paroxetine Other (See Comments)    Ineffective.  Marland Kitchen Penicillins Hives    welts Has patient had a PCN reaction causing immediate rash, facial/tongue/throat swelling, SOB or lightheadedness with hypotension: No Has patient had a PCN reaction causing severe rash involving mucus membranes or skin necrosis: Unknown Has patient had a PCN reaction that required hospitalization:No Has patient had a PCN reaction occurring within the last 10 years: No If all of the above answers are "NO", then may proceed with Cephalosporin use.     Social Hx:   Social History   Socioeconomic History  . Marital status: Married    Spouse name: Not on file  . Number of children: 2  . Years of education: Not on file  . Highest  education level: High school graduate  Occupational History  . 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 Financial resource strain: Not on file  . Food insecurity:    Worry: Not on file    Inability: Not on file  . Transportation needs:    Medical: Not on file    Non-medical: Not on file  Tobacco Use  . Smoking status: Never Smoker  . Smokeless tobacco: Never Used  Substance and Sexual Activity  . Alcohol use: No    Alcohol/week: 0.0 standard drinks  . Drug use: No  . Sexual activity: Yes    Birth control/protection: Post-menopausal  Lifestyle  . Physical activity:    Days per week: Not on file    Minutes per session: Not on file  . Stress: Not on file  Relationships  . Social connections:    Talks on phone: Not on file    Gets together: Not on file    Attends religious service: Not on file  Active member of club or organization: Not on file    Attends meetings of clubs or organizations: Not on file    Relationship status: Not on file  . Intimate partner violence:    Fear of current or ex partner: Not on file    Emotionally abused: Not on file    Physically abused: Not on file    Forced sexual activity: Not on file  Other Topics Concern  . Not on file  Social History Narrative   She is relatively recently remarried.  She has 2  children and 3 grandchildren.     She is currently retired.  But enjoys cleaning house and doing chores.  She likes to do yard work.  Does not routinely exercise.    Past Surgical Hx:  Past Surgical History:  Procedure Laterality Date  . ANKLE SURGERY Right 1956  . CARPAL TUNNEL RELEASE Bilateral 1986, 1993  . COLONOSCOPY N/A 06/24/2015   Procedure: COLONOSCOPY;  Surgeon: Gatha Mayer, MD;  Location: [facility];  Service: Endoscopy;  Laterality: N/A;  . DILATATION & CURETTAGE/HYSTEROSCOPY WITH MYOSURE N/A 08/09/2017   Procedure: DILATATION & CURETTAGE/HYSTEROSCOPY WITH  MYOSURE;  Surgeon: Christophe Louis, MD;  Location: [facility];  Service: Gynecology;  Laterality: N/A;  Polypectomy  . DILATION AND CURETTAGE OF UTERUS     PMB  . HAND SURGERY Bilateral 1999   Thumbs   . KNEE SURGERY Bilateral 1998   x2  . LYMPH NODE BIOPSY N/A 10/08/2017   Procedure: LYMPH NODE BIOPSY;  Surgeon: [name], MD;  Location: [facility];  Service: Gynecology;  Laterality: N/A;  . MULTIPLE TOOTH EXTRACTIONS    . NASAL SINUS SURGERY  1976  . polio surgery.     right foot - right leg 1.5" shorter than left leg - some wekness  . ROBOTIC ASSISTED TOTAL HYSTERECTOMY WITH BILATERAL SALPINGO OOPHERECTOMY Bilateral 10/08/2017   Procedure: XI ROBOTIC ASSISTED TOTAL HYSTERECTOMY WITH BILATERAL SALPINGO OOPHORECTOMY;  Surgeon: [name], MD;  Location: [facility];  Service: Gynecology;  Laterality: Bilateral;  . THUMB ARTHROSCOPY  1999  . TONSILLECTOMY  5834   73 years old  . TRANSTHORACIC ECHOCARDIOGRAM  07/30/2017   mild LVH, ef 63-89%, grade 2 diastolic dysfunction/ moderately thickened and mild calcified AV leaflets with moderate stenosis & mild AR (valve area 1.15cm^2, mean gradiant 12mHg, peak grandient 435mg)/ mild LAE and RAE/ trivial TR  . TRANSTHORACIC ECHOCARDIOGRAM  08/29/2018   Mod LVH. Mod-Severe AS (mild AI) - mean gradient now 38 mmHg. Mild-Mod MAC. No Veronica.  --notable progression of disease  . TUBAL LIGATION    . UPPER GI ENDOSCOPY     normal  . WISDOM TOOTH EXTRACTION      Past Medical Hx:  Past Medical History:  Diagnosis Date  . Allergic rhinitis   . Arthritis    knees  . Depression   . Depression with anxiety   . [facility] [place]   [facility]/ 2019  Diagnosed with D&C/hysteroscopy with polypectomy  . Family history of colon cancer   . Family history of colonic polyps   . Fibromyalgia    tx mobic  . Full dentures   . GERD (gastroesophageal reflux disease)   . Gout   . Hearing loss    wears bilateral hearing aids  . Heart murmur   . History of colon polyps   .  History of nonmelanoma skin cancer    right lower leg  . Hypertension   . IBS (irritable bowel syndrome)  with constipation  . Moderate aortic stenosis by prior echocardiogram 07/30/2017---  cardiologist-- dr harding   Previously diagnosed with murmur back in 2013. echo 07-30-2017 : Moderate AS with mild AR,  valve area 1.15cm^2,  Mean gradient 23 mmHg., peak gradient 62mHg  . Osteopenia   . Polio age 74 months  right leg weakness and 1.5 inches shorter than left leg.   . Skin cancer 2019   Right leg  . Urgency of urination   . UTI (urinary tract infection) 01/2018  . Wears hearing aid in both ears     Past Gynecological History:  Endometrial cancer. No LMP recorded. Patient is postmenopausal.  Family Hx:  Family History  Problem Relation Age of Onset  . Kidney disease Mother   . Alzheimer's disease Mother   . Stroke Father 729 . Colon cancer Father 79      possibly colon cancer, possibly just polyps  . Kidney disease Sister   . Heart attack Sister   . Colon cancer Paternal Aunt 763 . Cancer Paternal Uncle        unk type  . Esophageal cancer Neg Hx   . Stomach cancer Neg Hx     Review of Systems:  Constitutional  Feels well,    ENT Normal appearing ears and nares bilaterally Skin/Breast  No rash, sores, jaundice, itching, dryness Cardiovascular  No chest pain, shortness of breath, or edema  Pulmonary  No cough or wheeze.  Gastro Intestinal  No nausea, vomitting, or diarrhoea. No bright red blood per rectum, no change in bowel movement, or constipation. + intermittent RLQ pains Genito Urinary  No frequency, urgency, dysuria, no postmenopausal bleeding. Musculo Skeletal  No myalgia, arthralgia, joint swelling or pain  Neurologic  No weakness, numbness, change in gait,  Psychology  No depression, anxiety, insomnia.   Vitals:  Blood pressure (!) 151/58, pulse 62, temperature 97.9 F (36.6 C), temperature source Oral, resp. rate 18, height _0  (1.702 m),  weight 180 lb 14.4 oz (82.1 kg), SpO2 100 %.  Physical Exam: WD in NAD Neck  Supple NROM, without any enlargements.  Lymph Node Survey No cervical supraclavicular or inguinal adenopathy Cardiovascular  Pulse normal rate, regularity and rhythm.  Systolic murmur.  Lungs  Clear to auscultation bilateraly, without wheezes/crackles/rhonchi. Good air movement.  Skin  No rash/lesions/breakdown  Psychiatry  Alert and oriented to person, place, and time  Abdomen  Normoactive bowel sounds, abdomen soft, non-tender and obese without evidence of hernia. Well healed incisions.  Back No CVA tenderness Genito Urinary  External genitalia normal. Vaginal cuff healed, no lesions or masses visible or palpable.  Rectal  deferred Extremities  No bilateral cyanosis, clubbing or edema.  Veronica Solo MD  12/12/2018, 12:40 PM

## 2018-12-12 NOTE — Patient Instructions (Signed)
Please notify Dr Denman George at phone number [phone] if you notice vaginal bleeding, new pelvic or abdominal pains, bloating, feeling full easy, or a change in bladder or bowel function.   Dr Denman George has ordered you a CT scan to evaluate your right abdominal pains. Her office will contact you with the results.  Please contact Dr Serita Grit office (at [phone]) after your appointment with Dr Sondra Come to request an appointment with her for December, 2020.

## 2018-12-16 ENCOUNTER — Telehealth: Payer: Self-pay | Admitting: *Deleted

## 2018-12-16 NOTE — Telephone Encounter (Signed)
Patient called and stated that she lost her CT paperwork. Explained to the patient that 1) her appt was on 6/11 at 10:30am, she needs to arrive by 10:15am, 2) she is to drink the first bottle of contrast at 8:30am and the second bottle at 9:30am, and 3) nothing to eat or drink after 6:30am.

## 2018-12-17 DIAGNOSIS — Z85828 Personal history of other malignant neoplasm of skin: Secondary | ICD-10-CM | POA: Diagnosis not present

## 2018-12-17 DIAGNOSIS — D485 Neoplasm of uncertain behavior of skin: Secondary | ICD-10-CM | POA: Diagnosis not present

## 2018-12-17 DIAGNOSIS — L570 Actinic keratosis: Secondary | ICD-10-CM | POA: Diagnosis not present

## 2018-12-18 ENCOUNTER — Telehealth: Payer: Self-pay

## 2018-12-18 ENCOUNTER — Other Ambulatory Visit: Payer: Self-pay

## 2018-12-18 ENCOUNTER — Telehealth: Payer: Self-pay | Admitting: *Deleted

## 2018-12-18 ENCOUNTER — Ambulatory Visit (HOSPITAL_COMMUNITY)
Admission: RE | Admit: 2018-12-18 | Discharge: 2018-12-18 | Disposition: A | Discharge status: Home / Self Care | Payer: PPO | Source: Ambulatory Visit | Attending: Gynecologic Oncology | Admitting: Gynecologic Oncology

## 2018-12-18 ENCOUNTER — Encounter (HOSPITAL_COMMUNITY): Payer: Self-pay

## 2018-12-18 DIAGNOSIS — R1031 Right lower quadrant pain: Secondary | ICD-10-CM | POA: Diagnosis not present

## 2018-12-18 DIAGNOSIS — C541 Malignant neoplasm of endometrium: Secondary | ICD-10-CM | POA: Diagnosis not present

## 2018-12-18 MED ORDER — SODIUM CHLORIDE (PF) 0.9 % IJ SOLN
INTRAMUSCULAR | Status: AC
  Filled 2018-12-18: qty 50

## 2018-12-18 MED ORDER — IOHEXOL 300 MG/ML  SOLN
100.0000 mL | Freq: Once | INTRAMUSCULAR | Status: AC | PRN
  Administered 2018-12-18: 100 mL via INTRAVENOUS

## 2018-12-18 NOTE — Telephone Encounter (Signed)
Told Veronica Greene that the CT scan showed no evidence of recurrent or metastatic carcinoma, or other acute findings peer Melissa Cross,NP. Pt verbalized understanding.

## 2018-12-18 NOTE — Telephone Encounter (Signed)
Patient called and left a message to "please call me if you get my CT results today or tomorrow. I would like to not worry over the weekend."

## 2018-12-19 DIAGNOSIS — B91 Sequelae of poliomyelitis: Secondary | ICD-10-CM | POA: Diagnosis not present

## 2018-12-19 DIAGNOSIS — M6281 Muscle weakness (generalized): Secondary | ICD-10-CM | POA: Diagnosis not present

## 2018-12-19 DIAGNOSIS — Z9181 History of falling: Secondary | ICD-10-CM | POA: Diagnosis not present

## 2018-12-24 DIAGNOSIS — M6281 Muscle weakness (generalized): Secondary | ICD-10-CM | POA: Diagnosis not present

## 2018-12-24 DIAGNOSIS — B91 Sequelae of poliomyelitis: Secondary | ICD-10-CM | POA: Diagnosis not present

## 2018-12-24 DIAGNOSIS — Z9181 History of falling: Secondary | ICD-10-CM | POA: Diagnosis not present

## 2018-12-27 ENCOUNTER — Other Ambulatory Visit: Payer: Self-pay | Admitting: Family Medicine

## 2018-12-29 NOTE — Telephone Encounter (Signed)
Last OV was doxy CPE on 11/25/18, last filled 11/11/17 #90 with 3 refills, please advise

## 2019-01-01 ENCOUNTER — Telehealth: Payer: Self-pay | Admitting: Oncology

## 2019-01-01 ENCOUNTER — Inpatient Hospital Stay: Payer: PPO

## 2019-01-01 ENCOUNTER — Other Ambulatory Visit: Payer: Self-pay

## 2019-01-01 DIAGNOSIS — C541 Malignant neoplasm of endometrium: Secondary | ICD-10-CM

## 2019-01-01 LAB — URINALYSIS, COMPLETE (UACMP) WITH MICROSCOPIC
Bacteria, UA: NONE SEEN
Bilirubin Urine: NEGATIVE
Glucose, UA: NEGATIVE mg/dL
Hgb urine dipstick: NEGATIVE
Ketones, ur: NEGATIVE mg/dL
Leukocytes,Ua: NEGATIVE
Nitrite: NEGATIVE
Protein, ur: NEGATIVE mg/dL
Specific Gravity, Urine: 1.004 — ABNORMAL LOW (ref 1.005–1.030)
pH: 7 (ref 5.0–8.0)

## 2019-01-01 NOTE — Telephone Encounter (Signed)
Veronica Greene called and she said she had very painful intercourse on Tuesday night.  She said her vagina was very dry.  She denies having any vaginal bleeding.  She is using her vaginal dilator 2-3 times a week.  She also said her urine has a bad smell.  She denies having any dysuria.  Advised her to use the same lubricant next time as she uses for her dilator to see if that helps.  Also asked if she would like to come in for a urinalysis to make sure she doesn't have a UTI.  She verbalized agreement.

## 2019-01-02 ENCOUNTER — Telehealth: Payer: Self-pay | Admitting: Oncology

## 2019-01-02 LAB — URINE CULTURE: Culture: NO GROWTH

## 2019-01-02 NOTE — Telephone Encounter (Signed)
Veronica Greene that the urine culture came back with normal.  Discussed that I will ask Joylene John, NP on Monday about the vaginal pain and call her back.  She verbalized understanding and agreement.

## 2019-01-05 NOTE — Telephone Encounter (Signed)
Called Veronica Greene back and advised her try using a lubricant and if that does not help, we can refer her to pelvic floor rehab.  She verbalized agreement and said that she did have some itching this morning and is wondering if she has a yeast infection.  Advised her to try using some Monistat over the counter to see if that relieves the itching per Joylene John, NP.  She verbalized understanding and agreement.

## 2019-01-13 ENCOUNTER — Ambulatory Visit (INDEPENDENT_AMBULATORY_CARE_PROVIDER_SITE_OTHER): Payer: PPO | Admitting: Family Medicine

## 2019-01-13 ENCOUNTER — Telehealth: Payer: Self-pay | Admitting: Family Medicine

## 2019-01-13 ENCOUNTER — Other Ambulatory Visit: Payer: Self-pay

## 2019-01-13 ENCOUNTER — Encounter: Payer: Self-pay | Admitting: Family Medicine

## 2019-01-13 VITALS — BP 140/68 | HR 70 | Temp 98.1°F | Ht 67.0 in | Wt 181.4 lb

## 2019-01-13 DIAGNOSIS — L299 Pruritus, unspecified: Secondary | ICD-10-CM | POA: Insufficient documentation

## 2019-01-13 DIAGNOSIS — R2689 Other abnormalities of gait and mobility: Secondary | ICD-10-CM | POA: Diagnosis not present

## 2019-01-13 DIAGNOSIS — S2000XA Contusion of breast, unspecified breast, initial encounter: Secondary | ICD-10-CM | POA: Diagnosis not present

## 2019-01-13 NOTE — Telephone Encounter (Signed)
Best number [phone] or home # [phone]   Pt fell 6/27 and her left breast is bruised and she found a knot 7/5  She wanted to know what she needs to do.  Pt doesn't want to come into office unless it is necessary

## 2019-01-13 NOTE — Progress Notes (Signed)
Subjective:    Patient ID: Veronica Greene, female    DOB: 1945-07-10, 73 y.o.   MRN: [id number]  HPI Here for L breast injury after a fall on 6/27   Tripped over a planter working outdoors and fell head first into [employer] on both knees  Was able to get up after the fall  L wrist and hand were sore- that is much better   Has a bruise and a lump in L breast (thinks she hit a limb of the bush)  Was sore -that is improved     3 falls in June- balance issues Her R shoe is built up and it catches things  No dizzy and no headaches  Balance not as good- she was going to PT for that (had to cancel-and in the process of re scheduling)   Wears large lift on R shoe due to leg length disc from polio  Last mammogram 10/19 nl    Also ear itches - right ear  She took 5 mg of zyrtec and it seemed to help  Was struggling with allergies   Patient Active Problem List   Diagnosis Date Noted  . Contusion of breast, initial encounter 01/13/2019  . Ear itching 01/13/2019  . Poor balance 11/25/2018  . Genetic testing 06/23/2018  . Family history of colon cancer   . Family history of colonic polyps   . Right groin pain 04/22/2018  . Right hip pain 04/22/2018  . Fall at home 04/22/2018  . Elevated random blood glucose level 11/11/2017  . Endometrial cancer ([place]) 09/12/2017  . Essential hypertension 08/29/2017  . Moderate calcific aortic stenosis 08/27/2017  . Estrogen deficiency 11/05/2016  . Routine general medical examination at a health care facility 10/15/2015  . Encounter for Medicare annual wellness exam 09/30/2013  . Back pain 02/16/2013  . Hyperlipidemia, mild 09/02/2012  . Other screening mammogram 12/12/2011  . Degenerative disk disease 08/31/2011  . Hypokalemia 01/02/2011  . Personal history of colonic polyps 11/03/2010  . Constipation, slow transit 11/03/2010  . Melanosis coli 11/03/2010  . Gout 08/25/2010  . DERMATITIS, SEBORRHEIC 08/25/2010  .  Osteopenia 09/05/2007  . History of poliomyelitis 08/26/2007  . Obesity 08/26/2007  . Anxiety and depression 08/26/2007  . ALLERGIC RHINITIS 08/26/2007  . GERD 08/26/2007  . IBS 08/26/2007  . OVERACTIVE BLADDER 08/26/2007  . OSTEOARTHRITIS, HANDS, BILATERAL 08/26/2007  . FIBROMYALGIA 08/26/2007  . EDEMA, CHRONIC 08/26/2007  . URINARY INCONTINENCE 08/26/2007  . HEMATURIA, HX OF 08/26/2007   Past Medical History:  Diagnosis Date  . Allergic rhinitis   . Arthritis    knees  . Depression   . Depression with anxiety   . Endometrial cancer [facility])    02/ 2019  Diagnosed with D&C/hysteroscopy with polypectomy  . Family history of colon cancer   . Family history of colonic polyps   . Fibromyalgia    tx mobic  . Full dentures   . GERD (gastroesophageal reflux disease)   . Gout   . Hearing loss    wears bilateral hearing aids  . Heart murmur   . History of colon polyps   . History of nonmelanoma skin cancer    right lower leg  . Hypertension   . IBS (irritable bowel syndrome)    with constipation  . Moderate aortic stenosis by prior echocardiogram 07/30/2017---  cardiologist-- dr harding   Previously diagnosed with murmur back in 2013. echo 07-30-2017 : Moderate AS with mild AR,  valve  area 1.15cm^2,  Mean gradient 23 mmHg., peak gradient 42mHg  . Osteopenia   . Polio age 74 months  right leg weakness and 1.5 inches shorter than left leg.   . Skin cancer 2019   Right leg  . Urgency of urination   . UTI (urinary tract infection) 01/2018  . Wears hearing aid in both ears    Past Surgical History:  Procedure Laterality Date  . ANKLE SURGERY Right 1956  . CARPAL TUNNEL RELEASE Bilateral 1986, 1993  . COLONOSCOPY N/A 06/24/2015   Procedure: COLONOSCOPY;  Surgeon: CGatha Mayer MD;  Location: [facility];  Service: Endoscopy;  Laterality: N/A;  . DILATATION & CURETTAGE/HYSTEROSCOPY WITH MYOSURE N/A 08/09/2017   Procedure: DILATATION & CURETTAGE/HYSTEROSCOPY WITH MYOSURE;   Surgeon: CChristophe Louis MD;  [facility];  Service: Gynecology;  Laterality: N/A;  Polypectomy  . DILATION AND CURETTAGE OF UTERUS     PMB  . HAND SURGERY Bilateral 1999   Thumbs   . KNEE SURGERY Bilateral 1998   x2  . LYMPH NODE BIOPSY N/A 10/08/2017   Procedure: LYMPH NODE BIOPSY;  Surgeon: REveritt Amber MD;  Location: [facility];  Service: Gynecology;  Laterality: N/A;  . MULTIPLE TOOTH EXTRACTIONS    . NASAL SINUS SURGERY  1976  . polio surgery.     right foot - right leg 1.5" shorter than left leg - some wekness  . ROBOTIC ASSISTED TOTAL HYSTERECTOMY WITH BILATERAL SALPINGO OOPHERECTOMY Bilateral 10/08/2017   Procedure: XI ROBOTIC ASSISTED TOTAL HYSTERECTOMY WITH BILATERAL SALPINGO OOPHORECTOMY;  Surgeon: REveritt Amber MD;  Location: [facility];  Service: Gynecology;  Laterality: Bilateral;  . THUMB ARTHROSCOPY  1999  . TONSILLECTOMY  [id number]  73years old  . TRANSTHORACIC ECHOCARDIOGRAM  07/30/2017   mild LVH, ef 642-59% grade 2 diastolic dysfunction/ moderately thickened and mild calcified AV leaflets with moderate stenosis & mild AR (valve area 1.15cm^2, mean gradiant 260mg, peak grandient 4970m)/ mild LAE and RAE/ trivial TR  . TRANSTHORACIC ECHOCARDIOGRAM  08/29/2018   Mod LVH. Mod-Severe AS (mild AI) - mean gradient now 38 mmHg. Mild-Mod MAC. No MS.  --notable progression of disease  . TUBAL LIGATION    . UPPER GI ENDOSCOPY     normal  . WISDOM TOOTH EXTRACTION     Social History   Tobacco Use  . Smoking status: Never Smoker  . Smokeless tobacco: Never Used  Substance Use Topics  . Alcohol use: No    Alcohol/week: 0.0 standard drinks  . Drug use: No   Family History  Problem Relation Age of Onset  . Kidney disease Mother   . Alzheimer's disease Mother   . Stroke Father 79 21 Colon cancer Father 70 46    possibly colon cancer, possibly just polyps  . Kidney disease Sister   . Heart attack Sister   . Colon cancer Paternal Aunt 70 35 Cancer Paternal Uncle        unk type   . Esophageal cancer Neg Hx   . Stomach cancer Neg Hx    Allergies  Allergen Reactions  . Clarithromycin Swelling    throat swells  . Codeine Nausea And Vomiting  . Paroxetine Other (See Comments)    Ineffective.  . PMarland Kitchennicillins Hives    welts Has patient had a PCN reaction causing immediate rash, facial/tongue/throat swelling, SOB or lightheadedness with hypotension: No Has patient had a PCN reaction causing severe rash involving mucus membranes or skin necrosis: Unknown Has patient  had a PCN reaction that required hospitalization:No Has patient had a PCN reaction occurring within the last 10 years: No If all of the above answers are "NO", then may proceed with Cephalosporin use.    Current Outpatient Medications on File Prior to Visit  Medication Sig Dispense Refill  . acetaminophen (TYLENOL 8 HOUR ARTHRITIS PAIN) 650 MG CR tablet Take 650 mg by mouth daily as needed for pain.     Marland Kitchen acetic acid-hydrocortisone (ACETASOL HC) OTIC solution Place 2 drops into both ears 2 (two) times daily as needed (itching).    . Ascorbic Acid (VITAMIN C) 1000 MG tablet Take 1,000 mg by mouth daily with breakfast.     . Black Pepper-Turmeric (TURMERIC COMPLEX/BLACK PEPPER PO) Take by mouth. Liquid form    . Calcium-Vitamin D-Vitamin K (VIACTIV) [id number] MG-UNT-MCG CHEW Chew 1 tablet by mouth 2 (two) times daily. Morning & afternoon    . cetirizine (ZYRTEC) 10 MG tablet Take 5 mg by mouth as needed for allergies (alternates between mucinex & zyrtec for sinus symptoms).     . cholecalciferol (VITAMIN D) 1000 UNITS tablet Take 1,000 Units by mouth daily with breakfast.     . esomeprazole (NEXIUM) 20 MG capsule Take 40 mg by mouth daily before breakfast.    . Fexofenadine HCl (MUCINEX ALLERGY PO) Take 1,200 mg by mouth daily.    . fluticasone (FLONASE) 50 MCG/ACT nasal spray Place 2 sprays into both nostrils at bedtime.     . hydrochlorothiazide (HYDRODIURIL) 25 MG tablet Take 1 tablet (25 mg total) by  mouth daily with lunch. 90 tablet 3  . lubiprostone (AMITIZA) 24 MCG capsule Take 1 capsule (24 mcg total) by mouth 2 (two) times daily. LUNCH & SUPPER 180 capsule 3  . meloxicam (MOBIC) 7.5 MG tablet TAKE 1 TABLET BY MOUTH AT BEDTIME. 90 tablet 3  . Multiple Vitamin (MULTIVITAMIN WITH MINERALS) TABS tablet Take 1 tablet by mouth daily. WOMEN'S MULTIVITAMIN    . Polyethyl Glycol-Propyl Glycol (SYSTANE) 0.4-0.3 % SOLN Place 1 drop into both eyes 2 (two) times daily.    . potassium chloride (K-DUR) 10 MEQ tablet Take 2 tablets (20 mEq total) by mouth 2 (two) times daily. 360 tablet 3  . Simethicone (PHAZYME PO) Take 1 tablet by mouth 4 (four) times daily as needed (for gas (typically one tablet at night)).     . sodium chloride (AYR) 0.65 % nasal spray Place 1 spray into the nose 4 (four) times daily as needed for congestion.    . TURMERIC PO Take 1 tablet by mouth daily.    Marland Kitchen venlafaxine XR (EFFEXOR XR) 75 MG 24 hr capsule Take 1 capsule (75 mg total) by mouth daily after breakfast. 90 capsule 3   No current facility-administered medications on file prior to visit.      Review of Systems  Constitutional: Negative for activity change, appetite change, fatigue, fever and unexpected weight change.  HENT: Positive for postnasal drip. Negative for congestion, ear discharge, ear pain, rhinorrhea, sinus pressure, sinus pain, sore throat and tinnitus.   Eyes: Negative for pain, redness and visual disturbance.  Respiratory: Negative for cough, shortness of breath and wheezing.   Cardiovascular: Negative for chest pain and palpitations.  Gastrointestinal: Negative for abdominal pain, blood in stool, constipation and diarrhea.  Endocrine: Negative for polydipsia and polyuria.  Genitourinary: Negative for dysuria, frequency and urgency.       Breast contusion   Musculoskeletal: Negative for arthralgias, back pain and myalgias.  Skin: Negative  for color change, pallor and rash.  Allergic/Immunologic:  Negative for environmental allergies.  Neurological: Negative for dizziness, syncope and headaches.       Poor balance  Hematological: Negative for adenopathy. Does not bruise/bleed easily.  Psychiatric/Behavioral: Negative for decreased concentration and dysphoric mood. The patient is not nervous/anxious.        Objective:   Physical Exam Constitutional:      General: She is not in acute distress.    Appearance: Normal appearance. She is normal weight. She is not ill-appearing.  HENT:     Head: Normocephalic and atraumatic.     Right Ear: Tympanic membrane, ear canal and external ear normal.     Left Ear: Tympanic membrane, ear canal and external ear normal.     Nose: No congestion.     Comments: Boggy nares    Mouth/Throat:     Mouth: Mucous membranes are moist.     Pharynx: Oropharynx is clear.  Eyes:     General: No scleral icterus.       Right eye: No discharge.        Left eye: No discharge.     Extraocular Movements: Extraocular movements intact.     Conjunctiva/sclera: Conjunctivae normal.     Pupils: Pupils are equal, round, and reactive to light.  Neck:     Musculoskeletal: Normal range of motion and neck supple. No muscular tenderness.     Vascular: No carotid bruit.  Cardiovascular:     Rate and Rhythm: Normal rate and regular rhythm.     Heart sounds: Normal heart sounds.  Pulmonary:     Effort: Pulmonary effort is normal. No respiratory distress.     Breath sounds: Normal breath sounds. No wheezing or rales.  Genitourinary:    Comments: L breast has areas of resolving ecchymosis at 7:00 and 2:00  Higher area has pea size hematoma-very slightly tender at center of bruise   No other M or abd findings No abrasion or skin interruption  No nipple d/c Musculoskeletal:     Right lower leg: No edema.     Left lower leg: No edema.     Comments: R leg is shorter/shoe is built up  Lymphadenopathy:     Cervical: No cervical adenopathy.  Skin:    General: Skin is  warm and dry.     Coloration: Skin is not pale.     Findings: No erythema or rash.  Neurological:     Mental Status: She is alert. Mental status is at baseline.     Cranial Nerves: No cranial nerve deficit.     Coordination: Coordination normal.     Gait: Gait normal.     Deep Tendon Reflexes: Reflexes normal.  Psychiatric:        Mood and Affect: Mood normal.           Assessment & Plan:   Problem List Items Addressed This Visit      Other   Poor balance    Several falls in June Disc need to pay attention (not multitask) when walking  Enc her to return to PT when able      Contusion of breast, initial encounter - Primary    From recent fall  Ecchymosis on L breast is resolving/ hematoma at 2:00 position will take months to resolve I suspect  Reassuring exam/ not very tender  Will watch for s/s of infection or other change If able- screening mammogram in oct  Ear itching    Nl exam  Suspect R ear itching is from allergies  Since zyrtec is sedating enc her to try allegra or claritin or xyzal otc and update Continue flonase Update if not starting to improve in a week or if worsening

## 2019-01-13 NOTE — Patient Instructions (Addendum)
Claritin, allegra, xyzal are all antihistamines to try over the counter for allergies ( ? Less sedating than zyrtec)  Keep using flonase and saline nasal spray   Continue therapy when you can for balance   Use great caution not to fall  Do one thing at a time   The lumps in your breast are from injury  They weill take a long time to resolve - we will keep an eye on it

## 2019-01-13 NOTE — Assessment & Plan Note (Signed)
Nl exam  Suspect R ear itching is from allergies  Since zyrtec is sedating enc her to try allegra or claritin or xyzal otc and update Continue flonase Update if not starting to improve in a week or if worsening

## 2019-01-13 NOTE — Telephone Encounter (Signed)
Spoke to pt advised given knot Dr. Glori Bickers should eval to be on the safe side. Advised her of the safety measures in office and pt agreed to come in for an appt. appt scheduled today at 3pm

## 2019-01-13 NOTE — Assessment & Plan Note (Signed)
From recent fall  Ecchymosis on L breast is resolving/ hematoma at 2:00 position will take months to resolve I suspect  Reassuring exam/ not very tender  Will watch for s/s of infection or other change If able- screening mammogram in oct

## 2019-01-13 NOTE — Assessment & Plan Note (Signed)
Several falls in June Disc need to pay attention (not multitask) when walking  Enc her to return to PT when able

## 2019-01-15 ENCOUNTER — Ambulatory Visit: Payer: Self-pay | Admitting: Radiation Oncology

## 2019-01-19 ENCOUNTER — Ambulatory Visit: Payer: PPO | Admitting: Radiation Oncology

## 2019-02-16 ENCOUNTER — Ambulatory Visit: Payer: PPO | Admitting: Radiation Oncology

## 2019-02-17 ENCOUNTER — Encounter

## 2019-02-17 ENCOUNTER — Other Ambulatory Visit: Payer: Self-pay

## 2019-02-17 ENCOUNTER — Ambulatory Visit (HOSPITAL_COMMUNITY): Payer: PPO | Attending: Cardiology

## 2019-02-17 DIAGNOSIS — I350 Nonrheumatic aortic (valve) stenosis: Secondary | ICD-10-CM | POA: Diagnosis not present

## 2019-02-17 HISTORY — PX: TRANSTHORACIC ECHOCARDIOGRAM: SHX275

## 2019-02-24 ENCOUNTER — Telehealth: Payer: Self-pay | Admitting: Cardiology

## 2019-02-24 NOTE — Telephone Encounter (Signed)
Patient unable to access mychart, reviewed results with patient.   Notes recorded by Leonie Man, MD on 02/17/2019 at 4:36 PM EDT  Echocardiogram result:   Aortic Stenosis (Narrowing) has progressed from Moderate-Severe to Severe based upon pressure gradients. This is pretty fast progression over the past 18 months from Moderate (gradient of 23 mmHg in Jan 2019 to 38 mmHg in Feb 2020 & now 44 mmHg with current study). This is also complicated by progression of Aortic Regurgitation (back-leaking of the valve b/c it does not correctly close) from Mild to Moderate.  --> At this stage, we really need to pay close attention to symptoms such as: Angina (chest pain / pressure/ tightness usually occurring with exertion & associated with shortness of breath), CHF (congestive heart failure symptoms of shortness of breath lying flat, waking up short of breath & swelling also associated with shortness of breath on exertion), & Syncope (passing out or nearly passing out - usually without warning & usually associated with activity)   -The Mitral Valve is also mildly narrowed.   Otherwise, the Left ventricle is pumping well - but does have poor relaxation & indication of back pressure from the Aortic Valve Stenosis & Regurgitation.   We will need to discuss her symptoms & the next steps in evaluation / management in f/u appointment next month.   Wallace

## 2019-02-24 NOTE — Telephone Encounter (Signed)
New Message     Pt is calling for results of her Echo, she feels concerned and just wants to make sure everything is okay    Please call

## 2019-03-23 ENCOUNTER — Ambulatory Visit
Admission: RE | Admit: 2019-03-23 | Discharge: 2019-03-23 | Disposition: A | Discharge status: Home / Self Care | Payer: PPO | Source: Ambulatory Visit | Attending: Radiation Oncology | Admitting: Radiation Oncology

## 2019-03-23 ENCOUNTER — Other Ambulatory Visit: Payer: Self-pay

## 2019-03-23 ENCOUNTER — Telehealth: Payer: Self-pay | Admitting: *Deleted

## 2019-03-23 ENCOUNTER — Encounter: Payer: Self-pay | Admitting: Radiation Oncology

## 2019-03-23 VITALS — BP 150/67 | HR 66 | Temp 98.3°F | Resp 18 | Ht 67.0 in | Wt 178.1 lb

## 2019-03-23 DIAGNOSIS — Z08 Encounter for follow-up examination after completed treatment for malignant neoplasm: Secondary | ICD-10-CM | POA: Diagnosis not present

## 2019-03-23 DIAGNOSIS — R300 Dysuria: Secondary | ICD-10-CM

## 2019-03-23 DIAGNOSIS — R140 Abdominal distension (gaseous): Secondary | ICD-10-CM | POA: Diagnosis not present

## 2019-03-23 DIAGNOSIS — Z79899 Other long term (current) drug therapy: Secondary | ICD-10-CM | POA: Insufficient documentation

## 2019-03-23 DIAGNOSIS — Z8542 Personal history of malignant neoplasm of other parts of uterus: Secondary | ICD-10-CM | POA: Insufficient documentation

## 2019-03-23 DIAGNOSIS — Z923 Personal history of irradiation: Secondary | ICD-10-CM | POA: Insufficient documentation

## 2019-03-23 DIAGNOSIS — C541 Malignant neoplasm of endometrium: Secondary | ICD-10-CM

## 2019-03-23 LAB — URINALYSIS, COMPLETE (UACMP) WITH MICROSCOPIC
Bacteria, UA: NONE SEEN
Bilirubin Urine: NEGATIVE
Glucose, UA: NEGATIVE mg/dL
Hgb urine dipstick: NEGATIVE
Ketones, ur: NEGATIVE mg/dL
Leukocytes,Ua: NEGATIVE
Nitrite: NEGATIVE
Protein, ur: NEGATIVE mg/dL
Specific Gravity, Urine: 1.006 (ref 1.005–1.030)
pH: 7 (ref 5.0–8.0)

## 2019-03-23 NOTE — Progress Notes (Signed)
Radiation Oncology         [phone] ________________________________  Name: Veronica Greene MRN: [id number]  Date: 03/23/2019  DOB: 1946-06-19  Follow-Up Visit Note  CC: Greene, Veronica Fanny, MD  [name], MD    ICD-10-CM   1. Dysuria  R30.0 Urinalysis, Complete w Microscopic    Urine culture  2. Endometrial cancer ([place])  C54.1     Diagnosis:   73 y.o. female with Stage IB Grade 1 Endometrial Cancer  Interval Since Last Radiation:  1 year, 3 months, 1 week  Radiation treatment dates:   Brachytherapy: 11/20/2017, 11/27/2017, 12/05/2017, 12/09/2017, 12/11/2017  Site/dose:  Vaginal cuff / 6 Gy in 5 fractions for a total dose of 30 Gy  Narrative:  The patient returns today for routine follow-up. She last saw Dr. Denman George on 12/12/2018. Surveillance abdomen/pelvis CT was performed on 12/18/2018 given abdominal pain. This was negative with no evidence of recurrent or metastatic disease.  Patient notes some bloating but attributes this to lactose intolerance. She reports intermittent pain her her right groin in the last two weeks, dysuria and increased frequency. She denies hematuria, vaginal discharge or bleeding, rectal bleeding, and nausea or vomiting.  She continues to use her vaginal dilator at least twice a week and is sexually active.                        ALLERGIES:  is allergic to clarithromycin; codeine; paroxetine; and penicillins.  Meds: Current Outpatient Medications  Medication Sig Dispense Refill  . acetaminophen (TYLENOL 8 HOUR ARTHRITIS PAIN) 650 MG CR tablet Take 650 mg by mouth daily as needed for pain.     Marland Kitchen acetic acid-hydrocortisone (ACETASOL HC) OTIC solution Place 2 drops into both ears 2 (two) times daily as needed (itching).    . Ascorbic Acid (VITAMIN C) 1000 MG tablet Take 1,000 mg by mouth daily with breakfast.     . Black Pepper-Turmeric (TURMERIC COMPLEX/BLACK PEPPER PO) Take by mouth. Liquid form    . Calcium-Vitamin D-Vitamin K (VIACTIV) [id number]  MG-UNT-MCG CHEW Chew 1 tablet by mouth 2 (two) times daily. Morning & afternoon    . cetirizine (ZYRTEC) 10 MG tablet Take 5 mg by mouth as needed for allergies (alternates between mucinex & zyrtec for sinus symptoms).     . cholecalciferol (VITAMIN D) 1000 UNITS tablet Take 1,000 Units by mouth daily with breakfast.     . esomeprazole (NEXIUM) 20 MG capsule Take 40 mg by mouth daily before breakfast.    . Fexofenadine HCl (MUCINEX ALLERGY PO) Take 1,200 mg by mouth daily.    . fluticasone (FLONASE) 50 MCG/ACT nasal spray Place 2 sprays into both nostrils at bedtime.     . hydrochlorothiazide (HYDRODIURIL) 25 MG tablet Take 1 tablet (25 mg total) by mouth daily with lunch. 90 tablet 3  . lubiprostone (AMITIZA) 24 MCG capsule Take 1 capsule (24 mcg total) by mouth 2 (two) times daily. LUNCH & SUPPER 180 capsule 3  . meloxicam (MOBIC) 7.5 MG tablet TAKE 1 TABLET BY MOUTH AT BEDTIME. 90 tablet 3  . Multiple Vitamin (MULTIVITAMIN WITH MINERALS) TABS tablet Take 1 tablet by mouth daily. WOMEN'S MULTIVITAMIN    . Polyethyl Glycol-Propyl Glycol (SYSTANE) 0.4-0.3 % SOLN Place 1 drop into both eyes 2 (two) times daily.    . potassium chloride (K-DUR) 10 MEQ tablet Take 2 tablets (20 mEq total) by mouth 2 (two) times daily. 360 tablet 3  . Simethicone (PHAZYME PO) Take  1 tablet by mouth 4 (four) times daily as needed (for gas (typically one tablet at night)).     . sodium chloride (AYR) 0.65 % nasal spray Place 1 spray into the nose 4 (four) times daily as needed for congestion.    Marland Kitchen venlafaxine XR (EFFEXOR XR) 75 MG 24 hr capsule Take 1 capsule (75 mg total) by mouth daily after breakfast. 90 capsule 3   No current facility-administered medications for this encounter.     Physical Findings: The patient is in no acute distress. Patient is alert and oriented.  height is 5\' 7"  (1.702 m) and weight is 178 lb 2 oz (80.8 kg). Her temporal temperature is 98.3 F (36.8 C). Her blood pressure is 150/67  (abnormal) and her pulse is 66. Her respiration is 18 and oxygen saturation is 99%.   Lungs are clear to auscultation bilaterally. Heart has murmur (follows up with cardiology concerning this issue). No palpable cervical, supraclavicular, or axillary adenopathy. Abdomen soft, non-tender, normal bowel sounds.  On pelvic examination the external genitalia were unremarkable. A speculum exam was performed. There are no mucosal lesions noted in the vaginal vault. On bimanual and rectovaginal  examination there were no pelvic masses appreciated.  Vaginal cuff intact.  Lab Findings: Lab Results  Component Value Date   WBC 4.0 11/20/2018   HGB 12.8 11/20/2018   HCT 36.5 11/20/2018   MCV 89.8 11/20/2018   PLT 205.0 11/20/2018    Radiographic Findings: No results found.  Impression:  Stage IB Grade 1 Endometrial Cancer.  No evidence of recurrence on clinical exam.  As above the patient has urinary symptoms.  We will check a urinalysis culture and sensitivity to rule out bladder infection.  Plan:  Follow up with Dr. Denman George in 3 months. Follow up in radiation oncology in 6 months.  ____________________________________  Blair Promise, PhD, MD  This document serves as a record of services personally performed by Gery Pray, MD. It was created on his behalf by Wilburn Mylar, a trained medical scribe. The creation of this record is based on the scribe's personal observations and the provider's statements to them. This document has been checked and approved by the attending provider.

## 2019-03-23 NOTE — Progress Notes (Signed)
Pt presents today for f/u with Dr. Sondra Come. Pt denies c/o pain but state pain in RIGHT groin continues intermittently and has recently caused pain in the past two weeks. Pt reports burning with urination and increased frequency with hesitation. Pt would like to submit urine sample to r/o UTI. Pt denies hematuria. Pt denies vaginal bleeding/discharge. Pt denies rectal bleeding. Pt reports daily laxative use with daily soft stools. Pt denies abdominal bloating, N/V. Pt reports "gas" and takes simethicone with good results.   BP (!) 150/67 (BP Location: Left Arm, Patient Position: Sitting)   Pulse 66   Temp 98.3 F (36.8 C) (Temporal)   Resp 18   Ht 5\' 7"  (1.702 m)   Wt 178 lb 2 oz (80.8 kg)   SpO2 99%   BMI 27.90 kg/m   Wt Readings from Last 3 Encounters:  03/23/19 178 lb 2 oz (80.8 kg)  01/13/19 181 lb 7 oz (82.3 kg)  12/12/18 180 lb 14.4 oz (82.1 kg)   Loma Sousa, RN BSN

## 2019-03-23 NOTE — Telephone Encounter (Signed)
Returned patient's call and scheduled follow up appt

## 2019-03-23 NOTE — Patient Instructions (Signed)
Coronavirus (COVID-19) Are you at risk?  Are you at risk for the Coronavirus (COVID-19)?  To be considered HIGH RISK for Coronavirus (COVID-19), you have to meet the following criteria:  . Traveled to [place], [place], [place], [place] or [place]; or in the [place] to [place], [place], [place], or [place]; and have fever, cough, and shortness of breath within the last 2 weeks of travel OR . Been in close contact with a person diagnosed with COVID-19 within the last 2 weeks and have fever, cough, and shortness of breath . IF YOU DO NOT MEET THESE CRITERIA, YOU ARE CONSIDERED LOW RISK FOR COVID-19.  What to do if you are HIGH RISK for COVID-19?  . If you are having a medical emergency, call 911. . Seek medical care right away. Before you go to a doctor's office, urgent care or emergency department, call ahead and tell them about your recent travel, contact with someone diagnosed with COVID-19, and your symptoms. You should receive instructions from your physician's office regarding next steps of care.  . When you arrive at healthcare provider, tell the healthcare staff immediately you have returned from visiting [place], [place], [place], [place] or [place]; or traveled in the [place] to [place], [place], [place], or [place]; in the last two weeks or you have been in close contact with a person diagnosed with COVID-19 in the last 2 weeks.   . Tell the health care staff about your symptoms: fever, cough and shortness of breath. . After you have been seen by a medical provider, you will be either: o Tested for (COVID-19) and discharged home on quarantine except to seek medical care if symptoms worsen, and asked to  - Stay home and avoid contact with others until you get your results (4-5 days)  - Avoid travel on public transportation if possible (such as bus, train, or airplane) or o Sent to the Emergency Department by EMS for evaluation, COVID-19 testing, and possible  admission depending on your condition and test results.  What to do if you are LOW RISK for COVID-19?  Reduce your risk of any infection by using the same precautions used for avoiding the common cold or flu:  . Wash your hands often with soap and warm water for at least 20 seconds.  If soap and water are not readily available, use an alcohol-based hand sanitizer with at least 60% alcohol.  . If coughing or sneezing, cover your mouth and nose by coughing or sneezing into the elbow areas of your shirt or coat, into a tissue or into your sleeve (not your hands). . Avoid shaking hands with others and consider head nods or verbal greetings only. . Avoid touching your eyes, nose, or mouth with unwashed hands.  . Avoid close contact with people who are sick. . Avoid places or events with large numbers of people in one location, like concerts or sporting events. . Carefully consider travel plans you have or are making. . If you are planning any travel outside or inside the [place], visit the CDC's Travelers' Health webpage for the latest health notices. . If you have some symptoms but not all symptoms, continue to monitor at home and seek medical attention if your symptoms worsen. . If you are having a medical emergency, call 911.   ADDITIONAL HEALTHCARE OPTIONS FOR PATIENTS  [facility] / e-Visit: [email or web address]         [facility]: [phone]  [facility]: [phone]                   [facility]: [phone]   

## 2019-03-24 LAB — URINE CULTURE: Culture: <10000 — AB

## 2019-03-25 ENCOUNTER — Telehealth: Payer: Self-pay

## 2019-03-25 NOTE — Telephone Encounter (Signed)
Contacted pt to convey that urine labs were WNL per Dr. Sondra Come. Pt reports some transient s/s of UTI with soft drink intake. Pt knows to contact Rad Onc clinic if s/s persist. Pt verbalized understanding and agreement. Loma Sousa, RN BSN

## 2019-03-27 ENCOUNTER — Encounter: Payer: Self-pay | Admitting: Cardiology

## 2019-03-27 ENCOUNTER — Encounter

## 2019-03-27 ENCOUNTER — Other Ambulatory Visit: Payer: Self-pay

## 2019-03-27 ENCOUNTER — Ambulatory Visit: Payer: PPO | Admitting: Cardiology

## 2019-03-27 ENCOUNTER — Ambulatory Visit (INDEPENDENT_AMBULATORY_CARE_PROVIDER_SITE_OTHER): Payer: PPO | Admitting: Cardiology

## 2019-03-27 VITALS — BP 149/61 | HR 74 | Temp 98.1°F | Ht 67.0 in | Wt 178.8 lb

## 2019-03-27 DIAGNOSIS — I350 Nonrheumatic aortic (valve) stenosis: Secondary | ICD-10-CM | POA: Diagnosis not present

## 2019-03-27 DIAGNOSIS — E785 Hyperlipidemia, unspecified: Secondary | ICD-10-CM

## 2019-03-27 DIAGNOSIS — I10 Essential (primary) hypertension: Secondary | ICD-10-CM | POA: Diagnosis not present

## 2019-03-27 MED ORDER — METOPROLOL SUCCINATE ER 25 MG PO TB24: 25.0000 mg | ORAL_TABLET | Freq: Every day | ORAL | 3 refills | Status: DC

## 2019-03-27 NOTE — Progress Notes (Signed)
PCP: Abner Greenspan, MD  Clinic Note: Chief Complaint  Patient presents with  . Follow-up    6 months  . Aortic Stenosis    Now severe by echo    HPI: Veronica Greene is a 72 y.o. female with moderate-severe aortic stenosis who presents today for 65-monthfollow-up after use of echocardiogram..  Veronica Greene last seen on August 27, 2028 -- > denied any cardiac symptoms; just noted some off and on sharp chest discomfort radiating to her back that was relatively short-lived.  No notable exertional dyspnea to it.  No exertional chest pain.  No palpitations.  Recent Hospitalizations: none  Studies Personally Reviewed - (if available, images/films reviewed: From Epic Chart or Care Everywhere)  Echo 08/29/2018: Mod LVH. Mod-Severe AS (mild AI) - mean gradient now 38 mmHg. Mild-Mod MAC. No MS.   Echo 02/17/2019: Mod LVH. GR 2 DD. Severe AS (mean gradient = 44 mmHg with Mod AI. mild MS.  Interval History: And returns here today to discuss the results of her echocardiogram.  She cannot tell too much in the way of any significant symptoms.  She does note that she is been "slowing down over the last year or so, and may be just a little more prominent over the last several months.  She denies any exertional chest pain but has noticed that she may get little bit short of breath if she pushes herself up stairs.  She is not having any PND orthopnea.  No notable edema.  No irregular heartbeat/palpitations.  No syncope/near or TIA/amaurosis fugax.  But she has noted some positional dizziness and exertional fatigue.  No claudication.  ROS: A comprehensive was performed. Review of Systems  Constitutional: Negative for malaise/fatigue and weight loss.  HENT: Positive for congestion. Negative for nosebleeds.   Respiratory: Positive for cough.        Allergies  Gastrointestinal: Positive for abdominal pain and heartburn. Negative for blood in stool and melena.       Lots of bloating  and gas  Genitourinary: Negative for hematuria.  Musculoskeletal: Negative for joint pain.       Does well walking with her orthotic shoe  Neurological: Negative for dizziness, focal weakness, weakness and headaches.       Still has some mild poor balance from her post polio  Endo/Heme/Allergies: Positive for environmental allergies.  Psychiatric/Behavioral: Negative for memory loss. The patient is not nervous/anxious and does not have insomnia.   All other systems reviewed and are negative.  The patient does not have symptoms concerning for COVID-19 infection (fever, chills, cough, or new shortness of breath).  The patient is practicing social distancing.  I have reviewed and (if needed) personally updated the patient's problem list, medications, allergies, past medical and surgical history, social and family history.   Past Medical History:  Diagnosis Date  . Allergic rhinitis   . Arthritis    knees  . Depression   . Depression with anxiety   . Endometrial cancer ([facility]    02/ 2019  Diagnosed with D&C/hysteroscopy with polypectomy  . Family history of colon cancer   . Family history of colonic polyps   . Fibromyalgia    tx mobic  . Full dentures   . GERD (gastroesophageal reflux disease)   . Gout   . Hearing loss    wears bilateral hearing aids  . Heart murmur   . History of colon polyps   . History of nonmelanoma skin cancer  right lower leg  . Hypertension   . IBS (irritable bowel syndrome)    with constipation  . Osteopenia   . Polio age 54 months   right leg weakness and 1.5 inches shorter than left leg.   . Severe aortic stenosis by prior echocardiogram 07/30/2017---  cardiologist-- dr Ellyn Hack   Previously diagnosed with murmur back in 2013. -- Most recent echocardiogram with from 02/17/2019 -> now demonstrates SEVERE AS -mean gradient 44 mmHg (up from 38 mmHg in February 2020)  . Skin cancer 2019   Right leg  . Urgency of urination   . UTI (urinary tract  infection) 01/2018  . Wears hearing aid in both ears     Past Surgical History:  Procedure Laterality Date  . ANKLE SURGERY Right 1956  . CARPAL TUNNEL RELEASE Bilateral 1986, 1993  . COLONOSCOPY N/A 06/24/2015   Procedure: COLONOSCOPY;  Surgeon: Gatha Mayer, MD;  Location: [facility];  Service: Endoscopy;  Laterality: N/A;  . DILATATION & CURETTAGE/HYSTEROSCOPY WITH MYOSURE N/A 08/09/2017   Procedure: DILATATION & CURETTAGE/HYSTEROSCOPY WITH MYOSURE;  Surgeon: Christophe Louis, MD;  Location: [facility];  Service: Gynecology;  Laterality: N/A;  Polypectomy  . DILATION AND CURETTAGE OF UTERUS     PMB  . HAND SURGERY Bilateral 1999   Thumbs   . KNEE SURGERY Bilateral 1998   x2  . LYMPH NODE BIOPSY N/A 10/08/2017   Procedure: LYMPH NODE BIOPSY;  Surgeon: [name], MD;  Location: [facility];  Service: Gynecology;  Laterality: N/A;  . MULTIPLE TOOTH EXTRACTIONS    . NASAL SINUS SURGERY  1976  . polio surgery.     right foot - right leg 1.5" shorter than left leg - some wekness  . ROBOTIC ASSISTED TOTAL HYSTERECTOMY WITH BILATERAL SALPINGO OOPHERECTOMY Bilateral 10/08/2017   Procedure: XI ROBOTIC ASSISTED TOTAL HYSTERECTOMY WITH BILATERAL SALPINGO OOPHORECTOMY;  Surgeon: [name], MD;  Location: [facility];  Service: Gynecology;  Laterality: Bilateral;  . THUMB ARTHROSCOPY  1999  . TONSILLECTOMY  5448   73 years old  . TRANSTHORACIC ECHOCARDIOGRAM  1/'19-2/'20   a) mild LVH, ef 60-65%, grade 2 DD, Mod AoV thickening /mild calcified w/ Mod AS/Mild AR (valve area 1.15cm^2, mean gradiant 24mHg, peak grandient 425mg)/ mild LAE and RAE/ trivial TR; b) 08/2018: Mod LVH. Mod-Severe AS (mild AI) - mean gradient now 38 mmHg. Mild-Mod MAC. No MS.  --notable progression of disease  . TRANSTHORACIC ECHOCARDIOGRAM  02/17/2019   Mod LVH. GR 2 DD. Severe AS (mean gradient = 44 mmHg with Mod AI. mild MS.  . TUBAL LIGATION    . UPPER GI ENDOSCOPY     normal  . WISDOM TOOTH EXTRACTION      Current Meds   Medication Sig  . acetaminophen (TYLENOL 8 HOUR ARTHRITIS PAIN) 650 MG CR tablet Take 650 mg by mouth daily as needed for pain.   . Marland Kitchencetic acid-hydrocortisone (ACETASOL HC) OTIC solution Place 2 drops into both ears 2 (two) times daily as needed (itching).  . Ascorbic Acid (VITAMIN C) 1000 MG tablet Take 1,000 mg by mouth daily with breakfast.   . Black Pepper-Turmeric (TURMERIC COMPLEX/BLACK PEPPER PO) Take by mouth. Liquid form  . Calcium-Vitamin D-Vitamin K (VIACTIV) 50915-056-97G-UNT-MCG CHEW Chew 1 tablet by mouth 2 (two) times daily. Morning & afternoon  . cetirizine (ZYRTEC) 10 MG tablet Take 5 mg by mouth as needed for allergies (alternates between mucinex & zyrtec for sinus symptoms).   . cholecalciferol (VITAMIN D) 1000 UNITS tablet Take  1,000 Units by mouth daily with breakfast.   . esomeprazole (NEXIUM) 20 MG capsule Take 40 mg by mouth daily before breakfast.  . Fexofenadine HCl (MUCINEX ALLERGY PO) Take 1,200 mg by mouth daily.  . fluticasone (FLONASE) 50 MCG/ACT nasal spray Place 2 sprays into both nostrils at bedtime.   . hydrochlorothiazide (HYDRODIURIL) 25 MG tablet Take 1 tablet (25 mg total) by mouth daily with lunch.  . lubiprostone (AMITIZA) 24 MCG capsule Take 1 capsule (24 mcg total) by mouth 2 (two) times daily. LUNCH & SUPPER  . meloxicam (MOBIC) 7.5 MG tablet TAKE 1 TABLET BY MOUTH AT BEDTIME.  . Multiple Vitamin (MULTIVITAMIN WITH MINERALS) TABS tablet Take 1 tablet by mouth daily. WOMEN'S MULTIVITAMIN  . Polyethyl Glycol-Propyl Glycol (SYSTANE) 0.4-0.3 % SOLN Place 1 drop into both eyes 2 (two) times daily.  . potassium chloride (K-DUR) 10 MEQ tablet Take 2 tablets (20 mEq total) by mouth 2 (two) times daily.  . Simethicone (PHAZYME PO) Take 1 tablet by mouth 4 (four) times daily as needed (for gas (typically one tablet at night)).   . sodium chloride (AYR) 0.65 % nasal spray Place 1 spray into the nose 4 (four) times daily as needed for congestion.  Marland Kitchen venlafaxine XR  (EFFEXOR XR) 75 MG 24 hr capsule Take 1 capsule (75 mg total) by mouth daily after breakfast.    Allergies  Allergen Reactions  . Clarithromycin Swelling    throat swells  . Codeine Nausea And Vomiting  . Paroxetine Other (See Comments)    Ineffective.  Marland Kitchen Penicillins Hives    welts Has patient had a PCN reaction causing immediate rash, facial/tongue/throat swelling, SOB or lightheadedness with hypotension: No Has patient had a PCN reaction causing severe rash involving mucus membranes or skin necrosis: Unknown Has patient had a PCN reaction that required hospitalization:No Has patient had a PCN reaction occurring within the last 10 years: No If all of the above answers are "NO", then may proceed with Cephalosporin use.     Social History   Tobacco Use  . Smoking status: Never Smoker  . Smokeless tobacco: Never Used  Substance Use Topics  . Alcohol use: No    Alcohol/week: 0.0 standard drinks  . Drug use: No   Social History   Social History Narrative   She is relatively recently remarried.  She has 2  children and 3 grandchildren.     She is currently retired.  But enjoys cleaning house and doing chores.  She likes to do yard work.  Does not routinely exercise.    family history includes Alzheimer's disease in her mother; Cancer in her paternal uncle; Colon cancer (age of onset: 72) in her father and paternal aunt; Heart attack in her sister; Kidney disease in her mother and sister; Stroke (age of onset: 83) in her father.  Wt Readings from Last 3 Encounters:  03/27/19 178 lb 12.8 oz (81.1 kg)  03/23/19 178 lb 2 oz (80.8 kg)  01/13/19 181 lb 7 oz (82.3 kg)    PHYSICAL EXAM BP (!) 149/61   Pulse 74   Temp 98.1 F (36.7 C)   Ht _0  (1.702 m)   Wt 178 lb 12.8 oz (81.1 kg)   SpO2 99%   BMI 28.00 kg/m  Physical Exam  Constitutional: She is oriented to person, place, and time. She appears well-developed and well-nourished. No distress.  HENT:  Head: Normocephalic  and atraumatic.  Neck: Full passive range of motion without pain. Neck supple.  Decreased carotid pulses (mildl) present. No hepatojugular reflux and no JVD present. Carotid bruit is not present (Radiated AS murmur).  Cardiovascular: Normal rate, regular rhythm and intact distal pulses.  No extrasystoles are present. PMI is not displaced. Exam reveals no gallop and no friction rub.  Murmur heard.  Harsh crescendo-decrescendo midsystolic murmur is present with a grade of 3/6 at the upper right sternal border radiating to the neck. Pulmonary/Chest: Effort normal and breath sounds normal. No respiratory distress. She has no wheezes. She has no rales.  Abdominal: Soft. Bowel sounds are normal. She exhibits no distension. There is no abdominal tenderness.  No HSM  Musculoskeletal: Normal range of motion.        General: Deformity (Different size shoes because of different leg length) present. No edema.  Neurological: She is alert and oriented to person, place, and time.  Psychiatric: She has a normal mood and affect. Her behavior is normal. Judgment and thought content normal.  Seems a little bit dysthymic  Vitals reviewed.    Adult ECG Report Not checked  Other studies Reviewed: Additional studies/ records that were reviewed today include:  Recent Labs: From May 2020-TC 181, TG 147, HDL 46, LDL 105.  Hgb 12.8.   CR 0.64, K+ 4.1.  TSH 2.34; A1c 5.1.   ASSESSMENT / PLAN: Problem List Items Addressed This Visit    Severe calcific aortic stenosis - Primary (Chronic)    Pretty significant progression of disease over the last 18 months.  Now has reached a level of being severe air stenosis.  She is still not overly symptomatic per se, we spent some time discussing concerning symptoms of CHF, angina or syncope.  All she really notes now is a little bit of exercise intolerance.   Plan for now is to discussed with structural heart team as far as timing for full consultation.  I did discuss with her  right and left heart catheterization procedure in case that is indicated prior to consultation. Except for now I think we can do watchful waiting and reassess 3 months.  She will continue to monitor symptoms. Toprol 25 mg daily for heart rate and blood pressure control.      Relevant Medications   metoprolol succinate (TOPROL XL) 25 MG 24 hr tablet   Hyperlipidemia, mild (Chronic)    LDL did go up a little bit.  Noted by PCP.  Not currently on statin would like to target less than 100, if repeat labs are still over 100 to starting statin  For now, continue nonmedical management with diet and exercise      Relevant Medications   metoprolol succinate (TOPROL XL) 25 MG 24 hr tablet   Essential hypertension (Chronic)    Blood pressure is up a little bit today not is unsure why.  But this does allow [place] to start low-dose Toprol.  Otherwise we will continue HCTZ.      Relevant Medications   metoprolol succinate (TOPROL XL) 25 MG 24 hr tablet   Other Relevant Orders   EKG 12-Lead     COVID-19 Education: The signs and symptoms of COVID-19 were discussed with the patient and how to seek care for testing (follow up with PCP or arrange E-visit).   The importance of social distancing was discussed today.  I spent a total of 25 minutes with the patient and chart review. >  50% of the time was spent in direct patient consultation.   Current medicines are reviewed at length with the patient  today.  (+/- concerns) none The following changes have been made:  None  Patient Instructions  Medication Instructions:    - START TAKING METOPROLOL SUCCINATE 25 MG ( TOPROL XL 25 MG ) DAILY   If you need a refill on your cardiac medications before your next appointment, please call your pharmacy.   Lab work: NOT NEEDED   Testing/Procedures: Not needed  Follow-Up: At [facility], you and your health needs are our priority.  As part of our continuing mission to provide you with exceptional  heart care, we have created designated Provider Care Teams.  These Care Teams include your primary Cardiologist (physician) and Advanced Practice Providers (APPs -  Physician Assistants and Nurse Practitioners) who all work together to provide you with the care you need, when you need it. . You will need a follow up appointment in 3 months [place].  Please call our office 2 months in advance to schedule this appointment.  You may see Glenetta Hew, MD or one of the following Advanced Practice Providers on your designated Care Team:   . Rosaria Ferries, PA-C . Jory Sims, DNP, ANP  Any Other Special Instructions Will Be Listed Below (If Applicable).  Studies Ordered:   Orders Placed This Encounter  Procedures  . EKG 12-Lead      Glenetta Hew, M.D., M.S. Interventional Cardiologist   Pager # [phone] Phone # [phone] [address] [place], [place] [address]   Thank you for choosing Heartcare at [facility]!!

## 2019-03-27 NOTE — Patient Instructions (Addendum)
Medication Instructions:    - START TAKING METOPROLOL SUCCINATE 25 MG ( TOPROL XL 25 MG ) DAILY   If you need a refill on your cardiac medications before your next appointment, please call your pharmacy.   Lab work: NOT NEEDED   Testing/Procedures: Not needed  Follow-Up: At [facility], you and your health needs are our priority.  As part of our continuing mission to provide you with exceptional heart care, we have created designated Provider Care Teams.  These Care Teams include your primary Cardiologist (physician) and Advanced Practice Providers (APPs -  Physician Assistants and Nurse Practitioners) who all work together to provide you with the care you need, when you need it. . You will need a follow up appointment in 3 months [place].  Please call our office 2 months in advance to schedule this appointment.  You may see Glenetta Hew, MD or one of the following Advanced Practice Providers on your designated Care Team:   . Rosaria Ferries, PA-C . Jory Sims, DNP, ANP  Any Other Special Instructions Will Be Listed Below (If Applicable).

## 2019-03-27 NOTE — H&P (View-Only) (Signed)
PCP: Abner Greenspan, MD  Clinic Note: Chief Complaint  Patient presents with  . Follow-up    6 months  . Aortic Stenosis    Now severe by echo    HPI: Veronica Greene is Veronica 73 y.o. female with moderate-severe aortic stenosis who presents today for 37-monthfollow-up after use of echocardiogram..  APatsy Zaragozawas last seen on August 27, 2028 -- > denied any cardiac symptoms; just noted some off and on sharp chest discomfort radiating to her back that was relatively short-lived.  No notable exertional dyspnea to it.  No exertional chest pain.  No palpitations.  Recent Hospitalizations: none  Studies Personally Reviewed - (if available, images/films reviewed: From Epic Chart or Care Everywhere)  Echo 08/29/2018: Mod LVH. Mod-Severe AS (mild AI) - mean gradient now 38 mmHg. Mild-Mod MAC. No MS.   Echo 02/17/2019: Mod LVH. GR 2 DD. Severe AS (mean gradient = 44 mmHg with Mod AI. mild MS.  Interval History: And returns here today to discuss the results of her echocardiogram.  She cannot tell too much in the way of any significant symptoms.  She does note that she is been "slowing down over the last year or so, and may be just Veronica little more prominent over the last several months.  She denies any exertional chest pain but has noticed that she may get little bit short of breath if she pushes herself up stairs.  She is not having any PND orthopnea.  No notable edema.  No irregular heartbeat/palpitations.  No syncope/near or TIA/amaurosis fugax.  But she has noted some positional dizziness and exertional fatigue.  No claudication.  ROS: Veronica comprehensive was performed. Review of Systems  Constitutional: Negative for malaise/fatigue and weight loss.  HENT: Positive for congestion. Negative for nosebleeds.   Respiratory: Positive for cough.        Allergies  Gastrointestinal: Positive for abdominal pain and heartburn. Negative for blood in stool and melena.       Lots of bloating  and gas  Genitourinary: Negative for hematuria.  Musculoskeletal: Negative for joint pain.       Does well walking with her orthotic shoe  Neurological: Negative for dizziness, focal weakness, weakness and headaches.       Still has some mild poor balance from her post polio  Endo/Heme/Allergies: Positive for environmental allergies.  Psychiatric/Behavioral: Negative for memory loss. The patient is not nervous/anxious and does not have insomnia.   All other systems reviewed and are negative.  The patient does not have symptoms concerning for COVID-19 infection (fever, chills, cough, or new shortness of breath).  The patient is practicing social distancing.  I have reviewed and (if needed) personally updated the patient's problem list, medications, allergies, past medical and surgical history, social and family history.   Past Medical History:  Diagnosis Date  . Allergic rhinitis   . Arthritis    knees  . Depression   . Depression with anxiety   . Endometrial cancer ([facility]    02/ 2019  Diagnosed with D&C/hysteroscopy with polypectomy  . Family history of colon cancer   . Family history of colonic polyps   . Fibromyalgia    tx mobic  . Full dentures   . GERD (gastroesophageal reflux disease)   . Gout   . Hearing loss    wears bilateral hearing aids  . Heart murmur   . History of colon polyps   . History of nonmelanoma skin cancer  right lower leg  . Hypertension   . IBS (irritable bowel syndrome)    with constipation  . Osteopenia   . Polio age 10 months   right leg weakness and 1.5 inches shorter than left leg.   . Severe aortic stenosis by prior echocardiogram 07/30/2017---  cardiologist-- dr Ellyn Hack   Previously diagnosed with murmur back in 2013. -- Most recent echocardiogram with from 02/17/2019 -> now demonstrates SEVERE AS -mean gradient 44 mmHg (up from 38 mmHg in February 2020)  . Skin cancer 2019   Right leg  . Urgency of urination   . UTI (urinary tract  infection) 01/2018  . Wears hearing aid in both ears     Past Surgical History:  Procedure Laterality Date  . ANKLE SURGERY Right 1956  . CARPAL TUNNEL RELEASE Bilateral 1986, 1993  . COLONOSCOPY N/Veronica 06/24/2015   Procedure: COLONOSCOPY;  Surgeon: Gatha Mayer, MD;  Location: [facility];  Service: Endoscopy;  Laterality: N/Veronica;  . DILATATION & CURETTAGE/HYSTEROSCOPY WITH MYOSURE N/Veronica 08/09/2017   Procedure: DILATATION & CURETTAGE/HYSTEROSCOPY WITH MYOSURE;  Surgeon: Christophe Louis, MD;  Location: [facility];  Service: Gynecology;  Laterality: N/Veronica;  Polypectomy  . DILATION AND CURETTAGE OF UTERUS     PMB  . HAND SURGERY Bilateral 1999   Thumbs   . KNEE SURGERY Bilateral 1998   x2  . LYMPH NODE BIOPSY N/Veronica 10/08/2017   Procedure: LYMPH NODE BIOPSY;  Surgeon: [name], MD;  Location: [facility];  Service: Gynecology;  Laterality: N/Veronica;  . MULTIPLE TOOTH EXTRACTIONS    . NASAL SINUS SURGERY  1976  . polio surgery.     right foot - right leg 1.5" shorter than left leg - some wekness  . ROBOTIC ASSISTED TOTAL HYSTERECTOMY WITH BILATERAL SALPINGO OOPHERECTOMY Bilateral 10/08/2017   Procedure: XI ROBOTIC ASSISTED TOTAL HYSTERECTOMY WITH BILATERAL SALPINGO OOPHORECTOMY;  Surgeon: [name], MD;  Location: [facility];  Service: Gynecology;  Laterality: Bilateral;  . THUMB ARTHROSCOPY  1999  . TONSILLECTOMY  6037   73 years old  . TRANSTHORACIC ECHOCARDIOGRAM  1/'19-2/'20   Veronica) mild LVH, ef 60-65%, grade 2 DD, Mod AoV thickening /mild calcified w/ Mod AS/Mild AR (valve area 1.15cm^2, mean gradiant 41mHg, peak grandient 442mg)/ mild LAE and RAE/ trivial TR; b) 08/2018: Mod LVH. Mod-Severe AS (mild AI) - mean gradient now 38 mmHg. Mild-Mod MAC. No MS.  --notable progression of disease  . TRANSTHORACIC ECHOCARDIOGRAM  02/17/2019   Mod LVH. GR 2 DD. Severe AS (mean gradient = 44 mmHg with Mod AI. mild MS.  . TUBAL LIGATION    . UPPER GI ENDOSCOPY     normal  . WISDOM TOOTH EXTRACTION      Current Meds   Medication Sig  . acetaminophen (TYLENOL 8 HOUR ARTHRITIS PAIN) 650 MG CR tablet Take 650 mg by mouth daily as needed for pain.   . Marland Kitchencetic acid-hydrocortisone (ACETASOL HC) OTIC solution Place 2 drops into both ears 2 (two) times daily as needed (itching).  . Ascorbic Acid (VITAMIN C) 1000 MG tablet Take 1,000 mg by mouth daily with breakfast.   . Black Pepper-Turmeric (TURMERIC COMPLEX/BLACK PEPPER PO) Take by mouth. Liquid form  . Calcium-Vitamin D-Vitamin K (VIACTIV) 50314-970-26G-UNT-MCG CHEW Chew 1 tablet by mouth 2 (two) times daily. Morning & afternoon  . cetirizine (ZYRTEC) 10 MG tablet Take 5 mg by mouth as needed for allergies (alternates between mucinex & zyrtec for sinus symptoms).   . cholecalciferol (VITAMIN D) 1000 UNITS tablet Take  1,000 Units by mouth daily with breakfast.   . esomeprazole (NEXIUM) 20 MG capsule Take 40 mg by mouth daily before breakfast.  . Fexofenadine HCl (MUCINEX ALLERGY PO) Take 1,200 mg by mouth daily.  . fluticasone (FLONASE) 50 MCG/ACT nasal spray Place 2 sprays into both nostrils at bedtime.   . hydrochlorothiazide (HYDRODIURIL) 25 MG tablet Take 1 tablet (25 mg total) by mouth daily with lunch.  . lubiprostone (AMITIZA) 24 MCG capsule Take 1 capsule (24 mcg total) by mouth 2 (two) times daily. LUNCH & SUPPER  . meloxicam (MOBIC) 7.5 MG tablet TAKE 1 TABLET BY MOUTH AT BEDTIME.  . Multiple Vitamin (MULTIVITAMIN WITH MINERALS) TABS tablet Take 1 tablet by mouth daily. WOMEN'S MULTIVITAMIN  . Polyethyl Glycol-Propyl Glycol (SYSTANE) 0.4-0.3 % SOLN Place 1 drop into both eyes 2 (two) times daily.  . potassium chloride (K-DUR) 10 MEQ tablet Take 2 tablets (20 mEq total) by mouth 2 (two) times daily.  . Simethicone (PHAZYME PO) Take 1 tablet by mouth 4 (four) times daily as needed (for gas (typically one tablet at night)).   . sodium chloride (AYR) 0.65 % nasal spray Place 1 spray into the nose 4 (four) times daily as needed for congestion.  Marland Kitchen venlafaxine XR  (EFFEXOR XR) 75 MG 24 hr capsule Take 1 capsule (75 mg total) by mouth daily after breakfast.    Allergies  Allergen Reactions  . Clarithromycin Swelling    throat swells  . Codeine Nausea And Vomiting  . Paroxetine Other (See Comments)    Ineffective.  Marland Kitchen Penicillins Hives    welts Has patient had Veronica PCN reaction causing immediate rash, facial/tongue/throat swelling, SOB or lightheadedness with hypotension: No Has patient had Veronica PCN reaction causing severe rash involving mucus membranes or skin necrosis: Unknown Has patient had Veronica PCN reaction that required hospitalization:No Has patient had Veronica PCN reaction occurring within the last 10 years: No If all of the above answers are "NO", then may proceed with Cephalosporin use.     Social History   Tobacco Use  . Smoking status: Never Smoker  . Smokeless tobacco: Never Used  Substance Use Topics  . Alcohol use: No    Alcohol/week: 0.0 standard drinks  . Drug use: No   Social History   Social History Narrative   She is relatively recently remarried.  She has 2  children and 3 grandchildren.     She is currently retired.  But enjoys cleaning house and doing chores.  She likes to do yard work.  Does not routinely exercise.    family history includes Alzheimer's disease in her mother; Cancer in her paternal uncle; Colon cancer (age of onset: 54) in her father and paternal aunt; Heart attack in her sister; Kidney disease in her mother and sister; Stroke (age of onset: 36) in her father.  Wt Readings from Last 3 Encounters:  03/27/19 178 lb 12.8 oz (81.1 kg)  03/23/19 178 lb 2 oz (80.8 kg)  01/13/19 181 lb 7 oz (82.3 kg)    PHYSICAL EXAM BP (!) 149/61   Pulse 74   Temp 98.1 F (36.7 C)   Ht _0  (1.702 m)   Wt 178 lb 12.8 oz (81.1 kg)   SpO2 99%   BMI 28.00 kg/m  Physical Exam  Constitutional: She is oriented to person, place, and time. She appears well-developed and well-nourished. No distress.  HENT:  Head: Normocephalic  and atraumatic.  Neck: Full passive range of motion without pain. Neck supple.  Decreased carotid pulses (mildl) present. No hepatojugular reflux and no JVD present. Carotid bruit is not present (Radiated AS murmur).  Cardiovascular: Normal rate, regular rhythm and intact distal pulses.  No extrasystoles are present. PMI is not displaced. Exam reveals no gallop and no friction rub.  Murmur heard.  Harsh crescendo-decrescendo midsystolic murmur is present with Veronica grade of 3/6 at the upper right sternal border radiating to the neck. Pulmonary/Chest: Effort normal and breath sounds normal. No respiratory distress. She has no wheezes. She has no rales.  Abdominal: Soft. Bowel sounds are normal. She exhibits no distension. There is no abdominal tenderness.  No HSM  Musculoskeletal: Normal range of motion.        General: Deformity (Different size shoes because of different leg length) present. No edema.  Neurological: She is alert and oriented to person, place, and time.  Psychiatric: She has Veronica normal mood and affect. Her behavior is normal. Judgment and thought content normal.  Seems Veronica little bit dysthymic  Vitals reviewed.    Adult ECG Report Not checked  Other studies Reviewed: Additional studies/ records that were reviewed today include:  Recent Labs: From May 2020-TC 181, TG 147, HDL 46, LDL 105.  Hgb 12.8.   CR 0.64, K+ 4.1.  TSH 2.34; A1c 5.1.   ASSESSMENT / PLAN: Problem List Items Addressed This Visit    Severe calcific aortic stenosis - Primary (Chronic)    Pretty significant progression of disease over the last 18 months.  Now has reached Veronica level of being severe air stenosis.  She is still not overly symptomatic per se, we spent some time discussing concerning symptoms of CHF, angina or syncope.  All she really notes now is Veronica little bit of exercise intolerance.   Plan for now is to discussed with structural heart team as far as timing for full consultation.  I did discuss with her  right and left heart catheterization procedure in case that is indicated prior to consultation. Except for now I think we can do watchful waiting and reassess 3 months.  She will continue to monitor symptoms. Toprol 25 mg daily for heart rate and blood pressure control.      Relevant Medications   metoprolol succinate (TOPROL XL) 25 MG 24 hr tablet   Hyperlipidemia, mild (Chronic)    LDL did go up Veronica little bit.  Noted by PCP.  Not currently on statin would like to target less than 100, if repeat labs are still over 100 to starting statin  For now, continue nonmedical management with diet and exercise      Relevant Medications   metoprolol succinate (TOPROL XL) 25 MG 24 hr tablet   Essential hypertension (Chronic)    Blood pressure is up Veronica little bit today not is unsure why.  But this does allow [place] to start low-dose Toprol.  Otherwise we will continue HCTZ.      Relevant Medications   metoprolol succinate (TOPROL XL) 25 MG 24 hr tablet   Other Relevant Orders   EKG 12-Lead     COVID-19 Education: The signs and symptoms of COVID-19 were discussed with the patient and how to seek care for testing (follow up with PCP or arrange E-visit).   The importance of social distancing was discussed today.  I spent Veronica total of 25 minutes with the patient and chart review. >  50% of the time was spent in direct patient consultation.   Current medicines are reviewed at length with the patient  today.  (+/- concerns) none The following changes have been made:  None  Patient Instructions  Medication Instructions:    - START TAKING METOPROLOL SUCCINATE 25 MG ( TOPROL XL 25 MG ) DAILY   If you need Veronica refill on your cardiac medications before your next appointment, please call your pharmacy.   Lab work: NOT NEEDED   Testing/Procedures: Not needed  Follow-Up: At [facility], you and your health needs are our priority.  As part of our continuing mission to provide you with exceptional  heart care, we have created designated Provider Care Teams.  These Care Teams include your primary Cardiologist (physician) and Advanced Practice Providers (APPs -  Physician Assistants and Nurse Practitioners) who all work together to provide you with the care you need, when you need it. . You will need Veronica follow up appointment in 3 months [place].  Please call our office 2 months in advance to schedule this appointment.  You may see Glenetta Hew, MD or one of the following Advanced Practice Providers on your designated Care Team:   . Rosaria Ferries, PA-C . Jory Sims, DNP, ANP  Any Other Special Instructions Will Be Listed Below (If Applicable).  Studies Ordered:   Orders Placed This Encounter  Procedures  . EKG 12-Lead      Glenetta Hew, M.D., M.S. Interventional Cardiologist   Pager # [phone] Phone # [phone] [address] [place], [place] [address]   Thank you for choosing Heartcare at [facility]!!

## 2019-03-29 ENCOUNTER — Encounter: Payer: Self-pay | Admitting: Cardiology

## 2019-03-29 NOTE — Assessment & Plan Note (Signed)
Pretty significant progression of disease over the last 18 months.  Now has reached a level of being severe air stenosis.  She is still not overly symptomatic per se, we spent some time discussing concerning symptoms of CHF, angina or syncope.  All she really notes now is a little bit of exercise intolerance.   Plan for now is to discussed with structural heart team as far as timing for full consultation.  I did discuss with her right and left heart catheterization procedure in case that is indicated prior to consultation. Except for now I think we can do watchful waiting and reassess 3 months.  She will continue to monitor symptoms. Toprol 25 mg daily for heart rate and blood pressure control.

## 2019-03-29 NOTE — Assessment & Plan Note (Signed)
LDL did go up a little bit.  Noted by PCP.  Not currently on statin would like to target less than 100, if repeat labs are still over 100 to starting statin  For now, continue nonmedical management with diet and exercise

## 2019-03-29 NOTE — Assessment & Plan Note (Signed)
Blood pressure is up a little bit today not is unsure why.  But this does allow [place] to start low-dose Toprol.  Otherwise we will continue HCTZ.

## 2019-03-31 ENCOUNTER — Telehealth: Payer: Self-pay | Admitting: *Deleted

## 2019-03-31 DIAGNOSIS — Z01818 Encounter for other preprocedural examination: Secondary | ICD-10-CM

## 2019-03-31 DIAGNOSIS — I350 Nonrheumatic aortic (valve) stenosis: Secondary | ICD-10-CM

## 2019-03-31 NOTE — Telephone Encounter (Signed)
Spoke with patient. Per Dr Brenton Grills WOULD LIKE TO PROCEED WITH DOING THE RIGHT AND LEFT HEART CATH SOONER  RATHER THAN [place] UP APPOINTMENT IN 3 MONTHS.  PER DR HARDING ,HE SPOKE TO  THE OTHER PHYSICIANS REGARDING  REPAIR OR REPLACEMENT-THAT WAS THE RECOMENDATIONS-   INFORMATION GIVEN TO PATIENT. SHE VERBALIZED THAT SHE AND DR HARDING DISCUSS AT LAST OFFICE VISIT.   OPTIONS OF HAVING CATH NEXT WEEK OR  FOLLOWING PROPOSED . PATIENT AWARE SHE WILL NEED TO HAVE A COVID TEST 3 DAYS  PRIOR TO  CATH .  PATIENT  VERBALIZED SHE WOULD LIKE TO PROCEED FOR NEXT WEEK . PATIENT AWARE RN WILL FINALIZED  DETAILS ON 04/02/19 AND CALL PATIENT WITH INSTRUCTIONS. ( NEED CBC,BMP , COVID TEST ORDERED)

## 2019-04-02 NOTE — Telephone Encounter (Signed)
Schedule right and left  Heart cath with Dr Ellyn Hack  For Oct 1 , 2020  - patient will need to be at hospital at 5:30 am   COVID will be done on Monday Sept 28,2020   PATIENT IS AWARE AND VERBALIZED UNDERSTANDING INSTRUCTION LETTER WILL BE GIVEN  ON Monday SEPT 28,2020

## 2019-04-06 ENCOUNTER — Other Ambulatory Visit (HOSPITAL_COMMUNITY)
Admission: RE | Admit: 2019-04-06 | Discharge: 2019-04-06 | Disposition: A | Discharge status: Home / Self Care | Payer: PPO | Source: Ambulatory Visit | Attending: Cardiology | Admitting: Cardiology

## 2019-04-06 DIAGNOSIS — Z20828 Contact with and (suspected) exposure to other viral communicable diseases: Secondary | ICD-10-CM | POA: Insufficient documentation

## 2019-04-06 DIAGNOSIS — I350 Nonrheumatic aortic (valve) stenosis: Secondary | ICD-10-CM | POA: Diagnosis not present

## 2019-04-06 DIAGNOSIS — Z01818 Encounter for other preprocedural examination: Secondary | ICD-10-CM | POA: Diagnosis not present

## 2019-04-06 DIAGNOSIS — Z01812 Encounter for preprocedural laboratory examination: Secondary | ICD-10-CM | POA: Insufficient documentation

## 2019-04-07 ENCOUNTER — Telehealth: Payer: Self-pay | Admitting: *Deleted

## 2019-04-07 LAB — CBC
Hematocrit: 36.9 % (ref 34.0–46.6)
Hemoglobin: 12.5 g/dL (ref 11.1–15.9)
MCH: 30.9 pg (ref 26.6–33.0)
MCHC: 33.9 g/dL (ref 31.5–35.7)
MCV: 91 fL (ref 79–97)
Platelets: 221 10*3/uL (ref 150–450)
RBC: 4.05 x10E6/uL (ref 3.77–5.28)
RDW: 12.8 % (ref 11.7–15.4)
WBC: 4.5 10*3/uL (ref 3.4–10.8)

## 2019-04-07 LAB — BASIC METABOLIC PANEL
BUN/Creatinine Ratio: 23 (ref 12–28)
BUN: 15 mg/dL (ref 8–27)
CO2: 25 mmol/L (ref 20–29)
Calcium: 10.2 mg/dL (ref 8.7–10.3)
Chloride: 102 mmol/L (ref 96–106)
Creatinine, Ser: 0.66 mg/dL (ref 0.57–1.00)
GFR calc Af Amer: 101 mL/min/{1.73_m2} (ref >59)
GFR calc non Af Amer: 87 mL/min/{1.73_m2} (ref >59)
Glucose: 89 mg/dL (ref 65–99)
Potassium: 4.4 mmol/L (ref 3.5–5.2)
Sodium: 141 mmol/L (ref 134–144)

## 2019-04-07 LAB — NOVEL CORONAVIRUS, NAA (HOSP ORDER, SEND-OUT TO REF LAB; TAT 18-24 HRS): SARS-CoV-2, NAA: NOT DETECTED

## 2019-04-07 NOTE — Telephone Encounter (Signed)
Pt contacted pre-catheterization scheduled at [facility] for: Thursday April 09, 2019 7:30 AM Verified arrival time and place: [facility]) at: 5:30 AM   No solid food after midnight prior to cath, clear liquids until 5 AM day of procedure. Contrast allergy: no  Hold: HCTZ-AM of procedure. KCl-AM of procedure.  Except hold medications AM meds can be  taken pre-cath with sip of water including: ASA 81 mg   Confirmed patient has responsible person to drive home post procedure and observe 24 hours after arriving home: yes  Currently, due to Covid-19 pandemic, only one support person will be allowed with patient. Must be the same support person for that patient's entire stay, will be screened and required to wear a mask. They will be asked to wait in the waiting room for the duration of the patient's stay.  Patients are required to wear a mask when they enter the hospital.      COVID-19 Pre-Screening Questions:  . In the past 7 to 10 days have you had a cough,  shortness of breath, headache, congestion, fever (100 or greater) body aches, chills, sore throat, or sudden loss of taste or sense of smell? no . Have you been around anyone with known Covid 19? no . Have you been around anyone who is awaiting Covid 19 test results in the past 7 to 10 days? no . Have you been around anyone who has been exposed to Covid 19, or has mentioned symptoms of Covid 19 within the past 7 to 10 days? No  I reviewed procedure/mask/visitor instructions, Covid-19 screening questions with patient, she verbalized understanding, thanked me for call.

## 2019-04-09 ENCOUNTER — Other Ambulatory Visit: Payer: Self-pay

## 2019-04-09 ENCOUNTER — Encounter (HOSPITAL_COMMUNITY)
Admission: RE | Disposition: A | Discharge status: Home / Self Care | Payer: PPO | Source: Home / Self Care | Attending: Cardiology

## 2019-04-09 ENCOUNTER — Ambulatory Visit (HOSPITAL_COMMUNITY)
Admission: RE | Admit: 2019-04-09 | Discharge: 2019-04-09 | Disposition: A | Discharge status: Home / Self Care | Payer: PPO | Attending: Cardiology | Admitting: Cardiology

## 2019-04-09 ENCOUNTER — Encounter (HOSPITAL_COMMUNITY): Payer: Self-pay | Admitting: Cardiology

## 2019-04-09 DIAGNOSIS — M170 Bilateral primary osteoarthritis of knee: Secondary | ICD-10-CM | POA: Diagnosis not present

## 2019-04-09 DIAGNOSIS — Z90710 Acquired absence of both cervix and uterus: Secondary | ICD-10-CM | POA: Diagnosis not present

## 2019-04-09 DIAGNOSIS — H9193 Unspecified hearing loss, bilateral: Secondary | ICD-10-CM | POA: Insufficient documentation

## 2019-04-09 DIAGNOSIS — E785 Hyperlipidemia, unspecified: Secondary | ICD-10-CM | POA: Diagnosis not present

## 2019-04-09 DIAGNOSIS — I350 Nonrheumatic aortic (valve) stenosis: Secondary | ICD-10-CM | POA: Diagnosis not present

## 2019-04-09 DIAGNOSIS — Z90722 Acquired absence of ovaries, bilateral: Secondary | ICD-10-CM | POA: Insufficient documentation

## 2019-04-09 DIAGNOSIS — I272 Pulmonary hypertension, unspecified: Secondary | ICD-10-CM | POA: Insufficient documentation

## 2019-04-09 DIAGNOSIS — K589 Irritable bowel syndrome without diarrhea: Secondary | ICD-10-CM | POA: Diagnosis not present

## 2019-04-09 DIAGNOSIS — Z8249 Family history of ischemic heart disease and other diseases of the circulatory system: Secondary | ICD-10-CM | POA: Insufficient documentation

## 2019-04-09 DIAGNOSIS — Z79899 Other long term (current) drug therapy: Secondary | ICD-10-CM | POA: Diagnosis not present

## 2019-04-09 DIAGNOSIS — M797 Fibromyalgia: Secondary | ICD-10-CM | POA: Insufficient documentation

## 2019-04-09 DIAGNOSIS — Z881 Allergy status to other antibiotic agents status: Secondary | ICD-10-CM | POA: Insufficient documentation

## 2019-04-09 DIAGNOSIS — M8580 Other specified disorders of bone density and structure, unspecified site: Secondary | ICD-10-CM | POA: Diagnosis not present

## 2019-04-09 DIAGNOSIS — K219 Gastro-esophageal reflux disease without esophagitis: Secondary | ICD-10-CM | POA: Diagnosis not present

## 2019-04-09 DIAGNOSIS — I10 Essential (primary) hypertension: Secondary | ICD-10-CM | POA: Diagnosis not present

## 2019-04-09 DIAGNOSIS — Z880 Allergy status to penicillin: Secondary | ICD-10-CM | POA: Insufficient documentation

## 2019-04-09 DIAGNOSIS — Z885 Allergy status to narcotic agent status: Secondary | ICD-10-CM | POA: Insufficient documentation

## 2019-04-09 HISTORY — PX: RIGHT/LEFT HEART CATH AND CORONARY ANGIOGRAPHY: CATH118266

## 2019-04-09 LAB — POCT I-STAT EG7
Acid-base deficit: 1 mmol/L (ref 0.0–2.0)
Bicarbonate: 25.1 mmol/L (ref 20.0–28.0)
Bicarbonate: 26.2 mmol/L (ref 20.0–28.0)
Calcium, Ion: 1.16 mmol/L (ref 1.15–1.40)
Calcium, Ion: 1.29 mmol/L (ref 1.15–1.40)
HCT: 31 % — ABNORMAL LOW (ref 36.0–46.0)
HCT: 33 % — ABNORMAL LOW (ref 36.0–46.0)
Hemoglobin: 10.5 g/dL — ABNORMAL LOW (ref 12.0–15.0)
Hemoglobin: 11.2 g/dL — ABNORMAL LOW (ref 12.0–15.0)
O2 Saturation: 76 %
O2 Saturation: 77 %
Potassium: 3.6 mmol/L (ref 3.5–5.1)
Potassium: 3.8 mmol/L (ref 3.5–5.1)
Sodium: 142 mmol/L (ref 135–145)
Sodium: 144 mmol/L (ref 135–145)
TCO2: 26 mmol/L (ref 22–32)
TCO2: 28 mmol/L (ref 22–32)
pCO2, Ven: 45.3 mmHg (ref 44.0–60.0)
pCO2, Ven: 47.8 mmHg (ref 44.0–60.0)
pH, Ven: 7.346 (ref 7.250–7.430)
pH, Ven: 7.352 (ref 7.250–7.430)
pO2, Ven: 43 mmHg (ref 32.0–45.0)
pO2, Ven: 44 mmHg (ref 32.0–45.0)

## 2019-04-09 LAB — POCT I-STAT 7, (LYTES, BLD GAS, ICA,H+H)
Acid-Base Excess: 1 mmol/L (ref 0.0–2.0)
Bicarbonate: 25.6 mmol/L (ref 20.0–28.0)
Calcium, Ion: 1.27 mmol/L (ref 1.15–1.40)
HCT: 32 % — ABNORMAL LOW (ref 36.0–46.0)
Hemoglobin: 10.9 g/dL — ABNORMAL LOW (ref 12.0–15.0)
O2 Saturation: 99 %
Potassium: 3.9 mmol/L (ref 3.5–5.1)
Sodium: 141 mmol/L (ref 135–145)
TCO2: 27 mmol/L (ref 22–32)
pCO2 arterial: 41.2 mmHg (ref 32.0–48.0)
pH, Arterial: 7.402 (ref 7.350–7.450)
pO2, Arterial: 138 mmHg — ABNORMAL HIGH (ref 83.0–108.0)

## 2019-04-09 SURGERY — RIGHT/LEFT HEART CATH AND CORONARY ANGIOGRAPHY
Anesthesia: LOCAL

## 2019-04-09 MED ORDER — SODIUM CHLORIDE 0.9% FLUSH: 3.0000 mL | INTRAVENOUS | Status: DC | PRN

## 2019-04-09 MED ORDER — SODIUM CHLORIDE 0.9% FLUSH: 3.0000 mL | Freq: Two times a day (BID) | INTRAVENOUS | Status: DC

## 2019-04-09 MED ORDER — FENTANYL CITRATE (PF) 100 MCG/2ML IJ SOLN
INTRAMUSCULAR | Status: AC
  Filled 2019-04-09: qty 2

## 2019-04-09 MED ORDER — SODIUM CHLORIDE 0.9 % IV SOLN: 250.0000 mL | INTRAVENOUS | Status: DC | PRN

## 2019-04-09 MED ORDER — FENTANYL CITRATE (PF) 100 MCG/2ML IJ SOLN
INTRAMUSCULAR | Status: DC | PRN
  Administered 2019-04-09 (×2): 25 ug via INTRAVENOUS

## 2019-04-09 MED ORDER — IOHEXOL 350 MG/ML SOLN
INTRAVENOUS | Status: DC | PRN
  Administered 2019-04-09: 50 mL

## 2019-04-09 MED ORDER — ONDANSETRON HCL 4 MG/2ML IJ SOLN: 4.0000 mg | Freq: Four times a day (QID) | INTRAMUSCULAR | Status: DC | PRN

## 2019-04-09 MED ORDER — LABETALOL HCL 5 MG/ML IV SOLN: 10.0000 mg | INTRAVENOUS | Status: DC | PRN

## 2019-04-09 MED ORDER — SODIUM CHLORIDE 0.9 % WEIGHT BASED INFUSION
3.0000 mL/kg/h | INTRAVENOUS | Status: AC
  Administered 2019-04-09: 3 mL/kg/h via INTRAVENOUS

## 2019-04-09 MED ORDER — MIDAZOLAM HCL 2 MG/2ML IJ SOLN
INTRAMUSCULAR | Status: DC | PRN
  Administered 2019-04-09: 1 mg via INTRAVENOUS
  Administered 2019-04-09: 2 mg via INTRAVENOUS

## 2019-04-09 MED ORDER — HEPARIN SODIUM (PORCINE) 1000 UNIT/ML IJ SOLN
INTRAMUSCULAR | Status: AC
  Filled 2019-04-09: qty 1

## 2019-04-09 MED ORDER — ASPIRIN 81 MG PO CHEW: 81.0000 mg | CHEWABLE_TABLET | ORAL | Status: AC

## 2019-04-09 MED ORDER — ACETAMINOPHEN 325 MG PO TABS: 650.0000 mg | ORAL_TABLET | ORAL | Status: DC | PRN

## 2019-04-09 MED ORDER — MIDAZOLAM HCL 2 MG/2ML IJ SOLN
INTRAMUSCULAR | Status: AC
  Filled 2019-04-09: qty 2

## 2019-04-09 MED ORDER — LIDOCAINE HCL (PF) 1 % IJ SOLN
INTRAMUSCULAR | Status: DC | PRN
  Administered 2019-04-09 (×3): 2 mL

## 2019-04-09 MED ORDER — HEPARIN (PORCINE) IN NACL 1000-0.9 UT/500ML-% IV SOLN
INTRAVENOUS | Status: DC | PRN
  Administered 2019-04-09 (×2): 500 mL

## 2019-04-09 MED ORDER — HEPARIN SODIUM (PORCINE) 1000 UNIT/ML IJ SOLN
INTRAMUSCULAR | Status: DC | PRN
  Administered 2019-04-09: 4000 [IU] via INTRAVENOUS

## 2019-04-09 MED ORDER — SODIUM CHLORIDE 0.9 % WEIGHT BASED INFUSION: 1.0000 mL/kg/h | INTRAVENOUS | Status: DC

## 2019-04-09 MED ORDER — VERAPAMIL HCL 2.5 MG/ML IV SOLN
INTRAVENOUS | Status: AC
  Filled 2019-04-09: qty 2

## 2019-04-09 MED ORDER — VERAPAMIL HCL 2.5 MG/ML IV SOLN
INTRAVENOUS | Status: DC | PRN
  Administered 2019-04-09: 10 mL via INTRA_ARTERIAL

## 2019-04-09 MED ORDER — SODIUM CHLORIDE 0.9 % IV SOLN: INTRAVENOUS | Status: DC

## 2019-04-09 MED ORDER — HEPARIN (PORCINE) IN NACL 1000-0.9 UT/500ML-% IV SOLN
INTRAVENOUS | Status: AC
  Filled 2019-04-09: qty 1000

## 2019-04-09 MED ORDER — LIDOCAINE HCL (PF) 1 % IJ SOLN
INTRAMUSCULAR | Status: AC
  Filled 2019-04-09: qty 30

## 2019-04-09 SURGICAL SUPPLY — 13 items
CATH BALLN WEDGE 5F 110CM (CATHETERS) ×2 IMPLANT
CATH INFINITI 5FR AL1 (CATHETERS) ×2 IMPLANT
CATH OPTITORQUE TIG 4.0 5F (CATHETERS) ×2 IMPLANT
DEVICE RAD COMP TR BAND LRG (VASCULAR PRODUCTS) ×2 IMPLANT
GLIDESHEATH SLEND SS 6F .021 (SHEATH) ×2 IMPLANT
GUIDEWIRE INQWIRE 1.5J.035X260 (WIRE) ×1 IMPLANT
INQWIRE 1.5J .035X260CM (WIRE) ×2
KIT HEART LEFT (KITS) ×2 IMPLANT
PACK CARDIAC CATHETERIZATION (CUSTOM PROCEDURE TRAY) ×2 IMPLANT
SHEATH GLIDE SLENDER 4/5FR (SHEATH) ×2 IMPLANT
SHEATH PROBE COVER 6X72 (BAG) ×2 IMPLANT
TRANSDUCER W/STOPCOCK (MISCELLANEOUS) ×2 IMPLANT
TUBING CIL FLEX 10 FLL-RA (TUBING) ×2 IMPLANT

## 2019-04-09 NOTE — Discharge Instructions (Signed)
Drink plenty of fluids Keep right arm at or above the heart level   Radial Site Care  This sheet gives you information about how to care for yourself after your procedure. Your health care provider may also give you more specific instructions. If you have problems or questions, contact your health care provider. What can I expect after the procedure? After the procedure, it is common to have:  Bruising and tenderness at the catheter insertion area. Follow these instructions at home: Medicines  Take over-the-counter and prescription medicines only as told by your health care provider. Insertion site care  Follow instructions from your health care provider about how to take care of your insertion site. Make sure you: ? Wash your hands with soap and water before you change your bandage (dressing). If soap and water are not available, use hand sanitizer. ? Change your dressing as told by your health care provider. ? Leave stitches (sutures), skin glue, or adhesive strips in place. These skin closures may need to stay in place for 2 weeks or longer. If adhesive strip edges start to loosen and curl up, you may trim the loose edges. Do not remove adhesive strips completely unless your health care provider tells you to do that.  Check your insertion site every day for signs of infection. Check for: ? Redness, swelling, or pain. ? Fluid or blood. ? Pus or a bad smell. ? Warmth.  Do not take baths, swim, or use a hot tub until your health care provider approves.  You may shower 24-48 hours after the procedure, or as directed by your health care provider. ? Remove the dressing and gently wash the site with plain soap and water. ? Pat the area dry with a clean towel. ? Do not rub the site. That could cause bleeding.  Do not apply powder or lotion to the site. Activity   For 24 hours after the procedure, or as directed by your health care provider: ? Do not flex or bend the affected  arm. ? Do not push or pull heavy objects with the affected arm. ? Do not drive yourself home from the hospital or clinic. You may drive 24 hours after the procedure unless your health care provider tells you not to. ? Do not operate machinery or power tools.  Do not lift anything that is heavier than 10 lb (4.5 kg), or the limit that you are told, until your health care provider says that it is safe.  Ask your health care provider when it is okay to: ? Return to work or school. ? Resume usual physical activities or sports. ? Resume sexual activity. General instructions  If the catheter site starts to bleed, raise your arm and put firm pressure on the site. If the bleeding does not stop, get help right away. This is a medical emergency.  If you went home on the same day as your procedure, a responsible adult should be with you for the first 24 hours after you arrive home.  Keep all follow-up visits as told by your health care provider. This is important. Contact a health care provider if:  You have a fever.  You have redness, swelling, or yellow drainage around your insertion site. Get help right away if:  You have unusual pain at the radial site.  The catheter insertion area swells very fast.  The insertion area is bleeding, and the bleeding does not stop when you hold steady pressure on the area.  Your arm  or hand becomes pale, cool, tingly, or numb. These symptoms may represent a serious problem that is an emergency. Do not wait to see if the symptoms will go away. Get medical help right away. Call your local emergency services (911 in the [place] Do not drive yourself to the hospital. Summary  After the procedure, it is common to have bruising and tenderness at the site.  Follow instructions from your health care provider about how to take care of your radial site wound. Check the wound every day for signs of infection.  Do not lift anything that is heavier than 10 lb (4.5  kg), or the limit that you are told, until your health care provider says that it is safe. This information is not intended to replace advice given to you by your health care provider. Make sure you discuss any questions you have with your health care provider. Document Released: 07/28/2010 Document Revised: 07/31/2017 Document Reviewed: 07/31/2017 Elsevier Patient Education  2020 [employer].

## 2019-04-09 NOTE — Progress Notes (Signed)
Ambulated to bathroom to void tol well  

## 2019-04-09 NOTE — Progress Notes (Signed)
Small amt of swelling noted to right wrist. Veronica Greene held pressure to area, now area soft with no swelling

## 2019-04-09 NOTE — Progress Notes (Signed)
Discharge instructions given to pt and her husband. Both voice understanding.

## 2019-04-09 NOTE — Interval H&P Note (Signed)
History and Physical Interval Note:  04/09/2019 8:59 AM  Veronica Greene  has presented today for surgery, with the diagnosis of severe aortic stenosis-preop.  The various methods of treatment have been discussed with the patient and family. After consideration of risks, benefits and other options for treatment, the patient has consented to  Procedure(s): RIGHT/LEFT HEART CATH AND CORONARY ANGIOGRAPHY (N/A) as a surgical intervention.  The patient's history has been reviewed, patient examined, no change in status, stable for surgery.  I have reviewed the patient's chart and labs.  Questions were answered to the patient's satisfaction.     Glenetta Hew

## 2019-04-10 ENCOUNTER — Other Ambulatory Visit: Payer: Self-pay

## 2019-04-10 DIAGNOSIS — I350 Nonrheumatic aortic (valve) stenosis: Secondary | ICD-10-CM

## 2019-04-14 ENCOUNTER — Telehealth: Payer: Self-pay | Admitting: Cardiovascular Disease

## 2019-04-14 NOTE — Telephone Encounter (Signed)
New Message:   Pt called and wanted you to know that her husband will be coming in with her for her appointment with Dr Angelena Form on Thursday(04-16-19).

## 2019-04-14 NOTE — Telephone Encounter (Signed)
[id number] 10/6

## 2019-04-14 NOTE — Telephone Encounter (Signed)
I spoke to the patient who is [facility] and will need to have her husband assist in the conversation at her OV Thursday.

## 2019-04-16 ENCOUNTER — Other Ambulatory Visit: Payer: Self-pay

## 2019-04-16 ENCOUNTER — Encounter: Payer: Self-pay | Admitting: Cardiovascular Disease

## 2019-04-16 ENCOUNTER — Ambulatory Visit (INDEPENDENT_AMBULATORY_CARE_PROVIDER_SITE_OTHER): Payer: PPO | Admitting: Cardiovascular Disease

## 2019-04-16 VITALS — BP 134/64 | HR 78 | Ht 65.0 in | Wt 179.4 lb

## 2019-04-16 DIAGNOSIS — I350 Nonrheumatic aortic (valve) stenosis: Secondary | ICD-10-CM

## 2019-04-16 NOTE — Patient Instructions (Signed)
Medication Instructions:  No changes today If you need a refill on your cardiac medications before your next appointment, please call your pharmacy.   Lab work: None today If you have labs (blood work) drawn today and your tests are completely normal, you will receive your results only by: Marland Kitchen MyChart Message (if you have MyChart) OR . A paper copy in the mail If you have any lab test that is abnormal or we need to change your treatment, we will call you to review the results.  Testing/Procedures: See your instruction letter  Follow-Up: You have been given a letter today with detailed instructions for upcoming testing.

## 2019-04-16 NOTE — Progress Notes (Signed)
Structural Heart Clinic Consult Note  Chief Complaint  Patient presents with  . New Patient (Initial Visit)    severe aortic stenosis   History of Present Illness: 73 yo female with history of HTN, GERD, fibromyalgia, depression, anxiety and severe aortic stenosis who is referred to the structural heart clinic today by Dr. Ellyn Hack for further discussion of her aortic stenosis and possible TAVR. She has been followed for moderate aortic valve stenosis over the past few years. Most recent echo 02/17/19 with normal LV systolic function with HKVQ=25-95%. Mild mitral regurgitation. The aortic valve is thickened and calcified with poor leaflet excursion. The mean gradient is 44 mmHg, peak gradient 74 mmHg, AVA0.66cm2, dimensionless index 0.21. This is consistent with severe aortic stenosis. There is moderate aortic valve insufficiency. Cardiac catheterization 04/09/19 with no obstructive CAD.   She tells me that she has had progressive fatigue and dyspnea. No chest pain. She has some lower extremity edema at times. She has full dentures. She lives with her husband in [place]. 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   Primary Care Physician: Tower, [name], MD Primary Cardiologist: Glenetta Hew Referring Cardiologist: Glenetta Hew  Past Medical History:  Diagnosis Date  . Acute sinusitis 09/13/2014  . Allergic rhinitis   . Anxiety and depression 08/26/2007   Qualifier: Diagnosis of  By: Glori Bickers MD, Carmell Austria   . Arthritis    knees  . Back pain 02/16/2013  . Constipation, slow transit 11/03/2010  . CONTACT DERMATITIS 08/25/2010   Qualifier: Diagnosis of  By: Glori Bickers MD, Carmell Austria   . Contusion of breast, initial encounter 01/13/2019  . Degenerative disk disease 08/31/2011  . Depression with anxiety   . DERMATITIS, SEBORRHEIC 08/25/2010   Qualifier: Diagnosis of  By: Glori Bickers MD, Carmell Austria   . DIZZINESS OR VERTIGO 08/26/2007   Qualifier: Diagnosis of  By: Glori Bickers MD, Carmell Austria   . Dysuria 08/19/2009    Qualifier: Diagnosis of  By: Glori Bickers MD, Carmell Austria   . Ear itching 01/13/2019  . EDEMA, CHRONIC 08/26/2007   Qualifier: Diagnosis of  By: Glori Bickers MD, Carmell Austria   . Elevated random blood glucose level 11/11/2017   168  . Endometrial cancer [facility])    02/ 2019  Diagnosed with D&C/hysteroscopy with polypectomy  . Essential hypertension 08/29/2017  . Fibromyalgia    tx mobic  . Full dentures   . GERD 08/26/2007   Qualifier: Diagnosis of  By: Glori Bickers MD, Carmell Austria   . Gout   . Hearing loss    wears bilateral hearing aids  . History of colon polyps   . History of nonmelanoma skin cancer    right lower leg  . History of poliomyelitis 08/26/2007   Qualifier: History of  By: Glori Bickers MD, Carmell Austria   . Hyperlipidemia, mild 09/02/2012  . Hypertension   . Hypokalemia 01/02/2011  . IBS (irritable bowel syndrome)    with constipation  . Melanosis coli 11/03/2010  . Obesity 08/26/2007   Qualifier: Diagnosis of  By: Glori Bickers MD, Carmell Austria   . OSTEOARTHRITIS, HANDS, BILATERAL 08/26/2007   Qualifier: Diagnosis of  By: Glori Bickers MD, Carmell Austria   . Osteopenia   . OVERACTIVE BLADDER 08/26/2007   Qualifier: Diagnosis of  By: Glori Bickers MD, Carmell Austria   . Personal history of colonic polyps 11/03/2010   05/2018 13 polyps mostly adenomas, ssp's - recall 1 year 2020 Gatha Mayer, MD, [facility]   . Polio age 52 months   right  leg weakness and 1.5 inches shorter than left leg.   Marland Kitchen Poor balance 11/25/2018   With falls  Has rise in R shoe/post polio  Weak legs as well    . Severe aortic stenosis by prior echocardiogram 07/30/2017---  cardiologist-- dr Ellyn Hack   Previously diagnosed with murmur back in 2013. -- Most recent echocardiogram with from 02/17/2019 -> now demonstrates SEVERE AS -mean gradient 44 mmHg (up from 38 mmHg in February 2020)  . Severe calcific aortic stenosis 08/27/2017  . Skin cancer 2019   Right leg  . URINARY INCONTINENCE 08/26/2007   Qualifier: Diagnosis of  By: Glori Bickers MD, Carmell Austria   . Wears hearing aid in both ears      Past Surgical History:  Procedure Laterality Date  . ANKLE SURGERY Right 1956  . CARPAL TUNNEL RELEASE Bilateral 1986, 1993  . COLONOSCOPY N/A 06/24/2015   Procedure: COLONOSCOPY;  Surgeon: Gatha Mayer, MD;  Location: [facility];  Service: Endoscopy;  Laterality: N/A;  . DILATATION & CURETTAGE/HYSTEROSCOPY WITH MYOSURE N/A 08/09/2017   Procedure: DILATATION & CURETTAGE/HYSTEROSCOPY WITH MYOSURE;  Surgeon: Christophe Louis, MD;  Location: [facility];  Service: Gynecology;  Laterality: N/A;  Polypectomy  . DILATION AND CURETTAGE OF UTERUS     PMB  . HAND SURGERY Bilateral 1999   Thumbs   . KNEE SURGERY Bilateral 1998   x2  . LYMPH NODE BIOPSY N/A 10/08/2017   Procedure: LYMPH NODE BIOPSY;  Surgeon: [name], MD;  Location: [facility];  Service: Gynecology;  Laterality: N/A;  . MULTIPLE TOOTH EXTRACTIONS    . NASAL SINUS SURGERY  1976  . polio surgery.     right foot - right leg 1.5" shorter than left leg - some wekness  . RIGHT/LEFT HEART CATH AND CORONARY ANGIOGRAPHY N/A 04/09/2019   Procedure: RIGHT/LEFT HEART CATH AND CORONARY ANGIOGRAPHY;  Surgeon: Leonie Man, MD;  Location: [facility];  Service: Cardiovascular;  Laterality: N/A;  . ROBOTIC ASSISTED TOTAL HYSTERECTOMY WITH BILATERAL SALPINGO OOPHERECTOMY Bilateral 10/08/2017   Procedure: XI ROBOTIC ASSISTED TOTAL HYSTERECTOMY WITH BILATERAL SALPINGO OOPHORECTOMY;  Surgeon: [name], MD;  Location: [facility];  Service: Gynecology;  Laterality: Bilateral;  . THUMB ARTHROSCOPY  1999  . TONSILLECTOMY  76   72 years old  . TRANSTHORACIC ECHOCARDIOGRAM  1/'19-2/'20   a) mild LVH, ef 60-65%, grade 2 DD, Mod AoV thickening /mild calcified w/ Mod AS/Mild AR (valve area 1.15cm^2, mean gradiant 78mHg, peak grandient 439mg)/ mild LAE and RAE/ trivial TR; b) 08/2018: Mod LVH. Mod-Severe AS (mild AI) - mean gradient now 38 mmHg. Mild-Mod MAC. No MS.  --notable progression of disease  . TRANSTHORACIC ECHOCARDIOGRAM  02/17/2019   Mod LVH. GR  2 DD. Severe AS (mean gradient = 44 mmHg with Mod AI. mild MS.  . TUBAL LIGATION    . UPPER GI ENDOSCOPY     normal  . WISDOM TOOTH EXTRACTION      Current Outpatient Medications  Medication Sig Dispense Refill  . acetaminophen (TYLENOL 8 HOUR ARTHRITIS PAIN) 650 MG CR tablet Take 650 mg by mouth daily as needed for pain.     . Marland Kitchencetaminophen (TYLENOL) 325 MG tablet Take 650 mg by mouth every 6 (six) hours as needed (for pain.).    . Marland Kitchencetic acid-hydrocortisone (ACETASOL HC) OTIC solution Place 2 drops into both ears 2 (two) times daily as needed (itching).    . Ascorbic Acid (VITAMIN C) 1000 MG tablet Take 1,000 mg by mouth daily with breakfast.     .  aspirin EC 81 MG tablet Take 81 mg by mouth daily.    . Black Pepper-Turmeric (TURMERIC COMPLEX/BLACK PEPPER PO) Take 15 mLs by mouth daily with breakfast. Liquid form     . Calcium-Phosphorus-Vitamin D (CALCIUM/VITAMIN D3/ADULT GUMMY PO) Take 1 tablet by mouth 2 (two) times daily. Morning & afternoon    . cetirizine (ZYRTEC) 10 MG tablet Take 5 mg by mouth as needed for allergies (alternates between mucinex & zyrtec for sinus symptoms).     . cholecalciferol (VITAMIN D) 1000 UNITS tablet Take 1,000 Units by mouth daily with breakfast.     . esomeprazole (NEXIUM) 20 MG capsule Take 40 mg by mouth daily before breakfast.    . fluticasone (FLONASE) 50 MCG/ACT nasal spray Place 2 sprays into both nostrils at bedtime.     . GUAIFENESIN 1200 PO Take 1,200 mg by mouth at bedtime. Mucinex    . hydrochlorothiazide (HYDRODIURIL) 25 MG tablet Take 1 tablet (25 mg total) by mouth daily with lunch. 90 tablet 3  . lubiprostone (AMITIZA) 24 MCG capsule Take 1 capsule (24 mcg total) by mouth 2 (two) times daily. LUNCH & SUPPER 180 capsule 3  . meloxicam (MOBIC) 7.5 MG tablet TAKE 1 TABLET BY MOUTH AT BEDTIME. 90 tablet 3  . metoprolol succinate (TOPROL XL) 25 MG 24 hr tablet Take 1 tablet (25 mg total) by mouth daily. 90 tablet 3  . Multiple Vitamin  (MULTIVITAMIN WITH MINERALS) TABS tablet Take 1 tablet by mouth daily. WOMEN'S MULTIVITAMIN    . Polyethyl Glycol-Propyl Glycol (SYSTANE) 0.4-0.3 % SOLN Place 1-2 drops into both eyes 2 (two) times daily.     . potassium chloride (K-DUR) 10 MEQ tablet Take 2 tablets (20 mEq total) by mouth 2 (two) times daily. 360 tablet 3  . Simethicone (PHAZYME PO) Take 1 tablet by mouth at bedtime as needed (for gas (typically one tablet at night)).     . sodium chloride (AYR) 0.65 % nasal spray Place 1 spray into the nose daily.     Marland Kitchen venlafaxine XR (EFFEXOR XR) 75 MG 24 hr capsule Take 1 capsule (75 mg total) by mouth daily after breakfast. 90 capsule 3   No current facility-administered medications for this visit.     Allergies  Allergen Reactions  . Clarithromycin Swelling    throat swells  . Codeine Nausea And Vomiting  . Paroxetine Other (See Comments)    Ineffective.  Marland Kitchen Penicillins Hives    welts Has patient had a PCN reaction causing immediate rash, facial/tongue/throat swelling, SOB or lightheadedness with hypotension: No Has patient had a PCN reaction causing severe rash involving mucus membranes or skin necrosis: Unknown Has patient had a PCN reaction that required hospitalization:No Has patient had a PCN reaction occurring within the last 10 years: No If all of the above answers are "NO", then may proceed with Cephalosporin use.     Social History   Socioeconomic History  . Marital status: Married    Spouse name: Not on file  . Number of children: 2  . Years of education: Not on file  . Highest education level: High school graduate  Occupational History  . 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 Financial resource strain: Not on file  . Food insecurity    Worry: Not on file    Inability: Not on file  . Transportation needs    Medical: Not on file  Non-medical: Not on file  Tobacco Use  . Smoking status:  Never Smoker  . Smokeless tobacco: Never Used  Substance and Sexual Activity  . Alcohol use: No    Alcohol/week: 0.0 standard drinks  . Drug use: No  . Sexual activity: Yes    Birth control/protection: Post-menopausal  Lifestyle  . Physical activity    Days per week: Not on file    Minutes per session: Not on file  . Stress: Not on file  Relationships  . Social Herbalist on phone: Not on file    Gets together: Not on file    Attends religious service: Not on file    Active member of club or organization: Not on file    Attends meetings of clubs or organizations: Not on file    Relationship status: Not on file  . Intimate partner violence    Fear of current or ex partner: Not on file    Emotionally abused: Not on file    Physically abused: Not on file    Forced sexual activity: Not on file  Other Topics Concern  . Not on file  Social History Narrative   She is relatively recently remarried.  She has 2  children and 3 grandchildren.     She is currently retired.  But enjoys cleaning house and doing chores.  She likes to do yard work.  Does not routinely exercise.    Family History  Problem Relation Age of Onset  . Kidney disease Mother   . Alzheimer's disease Mother   . Stroke Father 37  . Colon cancer Father 63       possibly colon cancer, possibly just polyps  . Kidney disease Sister   . Heart attack Sister   . Colon cancer Paternal Aunt 17  . Cancer Paternal Uncle        unk type  . Esophageal cancer Neg Hx   . Stomach cancer Neg Hx     Review of Systems:  As stated in the HPI and otherwise negative.   BP 134/64   Pulse 78   Ht _0  (1.651 m)   Wt 179 lb 6.4 oz (81.4 kg)   SpO2 94%   BMI 29.85 kg/m   Physical Examination: General: Well developed, well nourished, NAD  HEENT: OP clear, mucus membranes moist  SKIN: warm, dry. No rashes. Neuro: No focal deficits  Musculoskeletal: Muscle strength 5/5 all ext  Psychiatric: Mood and affect normal   Neck: No JVD, no carotid bruits, no thyromegaly, no lymphadenopathy.  Lungs:Clear bilaterally, no wheezes, rhonci, crackles Cardiovascular: Regular rate and rhythm. Loud, harsh, late peaking systolic murmur.  Abdomen:Soft. Bowel sounds present. Non-tender.  Extremities: No lower extremity edema. Pulses are 2 + in the bilateral DP/PT.  EKG:  EKG is not ordered today. The ekg from 03/27/19 is reviewed and shows sinus with poor R wave progression through the anteroseptal leads  Echo 02/17/19:  1. The left ventricle has normal systolic function with an ejection fraction of 60-65%. The cavity size was normal. Left ventricular diastolic Doppler parameters are consistent with pseudonormalization. Elevated left ventricular end-diastolic pressure.  2. The right ventricle has normal systolic function. The cavity was normal. There is no increase in right ventricular wall thickness. Right ventricular systolic pressure is mildly elevated with an estimated pressure of 35.0 mmHg.  3. The average left ventricular global longitudinal strain is -19.0 %.  4. Left atrial size was mildly dilated.  5. The mitral  valve is degenerative in appearance. Moderate thickening of the mitral valve leaflet. Mild calcification of the mitral valve leaflet. There is moderate mitral annular calcification present. Mitral valve regurgitation is mild by color flow  Doppler. Mild mitral valve stenosis. MV Mean grad: 5.0 mmHg. MV Area (PHT): 2.22 cm.  6. The aorta is normal in size and structure.  7. The aortic valve is tricuspid Severely thickening of the aortic valve. Severe calcifcation of the aortic valve. Aortic valve regurgitation is moderate by color flow Doppler. There is Severe stenosis of the aortic valve, with a calculated valve area  of 0.66 cm. Severe aortic annular calcification noted. AV Mean Grad: 44.0 mmHg. LVOT/AV VTI ratio: 0.21  8. Compared to prior echo the aortic stenosis has progressed (mean AVG 38>>80mHg and  dimensionless index 0.31>>0.21). The AI is now moderate.  FINDINGS  Left Ventricle: The left ventricle has normal systolic function, with an ejection fraction of 60-65%. The cavity size was normal. There is no increase in left ventricular wall thickness. Left ventricular diastolic Doppler parameters are consistent with  pseudonormalization. Elevated left ventricular end-diastolic pressure The average left ventricular global longitudinal strain is -19.0 %.  Right Ventricle: The right ventricle has normal systolic function. The cavity was normal. There is no increase in right ventricular wall thickness. Right ventricular systolic pressure is mildly elevated with an estimated pressure of 35.0 mmHg.  Left Atrium: Left atrial size was mildly dilated.  Right Atrium: Right atrial size was normal in size. Right atrial pressure is estimated at 8 mmHg.  Interatrial Septum: No atrial level shunt detected by color flow Doppler.  Pericardium: There is no evidence of pericardial effusion.  Mitral Valve: The mitral valve is degenerative in appearance. Moderate thickening of the mitral valve leaflet. Mild calcification of the mitral valve leaflet. There is moderate mitral annular calcification present. Mitral valve regurgitation is mild by  color flow Doppler. Mild mitral valve stenosis.  Tricuspid Valve: The tricuspid valve is normal in structure. Tricuspid valve regurgitation is mild by color flow Doppler.  Aortic Valve: The aortic valve is tricuspid Severely thickening of the aortic valve. Severe calcifcation of the aortic valve. Aortic valve regurgitation is moderate by color flow Doppler. There is Severe stenosis of the aortic valve, with a calculated  valve area of 0.66 cm. Severe aortic annular calcification noted.  Pulmonic Valve: The pulmonic valve was normal in structure. Pulmonic valve regurgitation is trivial by color flow Doppler.  Aorta: The aorta is normal in size and structure.   Venous: The inferior vena cava measures 1.78 cm, is normal in size with greater than 50% respiratory variability.  Compared to previous exam: 08/29/18 EF 65%. PA pressure 371mg. Moderate-severe AS 3833m mean, 23m52mpeak.    +--------------+--------++ LEFT VENTRICLE         +----------------+---------++ +--------------+--------++ Diastology                PLAX 2D                +----------------+---------++ +--------------+--------++ LV e' lateral:  5.85 cm/s LVIDd:        4.80 cm  +----------------+---------++ +--------------+--------++ LV E/e' lateral:24.3      LVIDs:        3.45 cm  +----------------+---------++ +--------------+--------++ LV e' medial:   4.95 cm/s LV PW:        1.00 cm  +----------------+---------++ +--------------+--------++ LV E/e' medial: 28.7      LV IVS:       0.90 cm  +----------------+---------++ +--------------+--------++  LVOT diam:    2.00 cm  +----------------------+-------++ +--------------+--------++ 2D Longitudinal Strain        LV SV:        58 ml    +----------------------+-------++ +--------------+--------++ 2D Strain GLS (A2C):  -16.6 % LV SV Index:  29.30    +----------------------+-------++ +--------------+--------++ 2D Strain GLS (A3C):  -19.8 % LVOT Area:    3.14 cm +----------------------+-------++ +--------------+--------++ 2D Strain GLS (A4C):  -20.7 %                        +----------------------+-------++ +--------------+--------++ 2D Strain GLS Avg:    -19.0 %                            +----------------------+-------++                              +-------------+------++                            3D Volume EF:                                  +-------------+------++                            3D EF:       59 %                              +-------------+------++                            LV EDV:      118 ml                             +-------------+------++                            LV ESV:      48 ml                             +-------------+------++                            LV SV:       70 ml                             +-------------+------++  +---------------+---------++ RIGHT VENTRICLE          +---------------+---------++ RV S prime:    9.60 cm/s +---------------+---------++ TAPSE (M-mode):1.9 cm    +---------------+---------++ RVSP:          35.0 mmHg +---------------+---------++  +---------------+-------++-----------++ LEFT ATRIUM           Index       +---------------+-------++-----------++ LA diam:       4.40 cm2.27 cm/m  +---------------+-------++-----------++ LA Vol (A2C):  52.3 ml26.96 ml/m +---------------+-------++-----------++ LA Vol (A4C):  66.1 ml34.07 ml/m +---------------+-------++-----------++ LA Biplane Vol:58.8 ml30.31 ml/m +---------------+-------++-----------++ +------------+---------++-----------++ RIGHT ATRIUM  Index       +------------+---------++-----------++ RA Pressure:8.00 mmHg            +------------+---------++-----------++ RA Area:    10.20 cm            +------------+---------++-----------++ RA Volume:  19.60 ml 10.10 ml/m +------------+---------++-----------++  +------------------+------------++ AORTIC VALVE                   +------------------+------------++ AV Area (Vmax):   0.82 cm     +------------------+------------++ AV Area (Vmean):  0.68 cm     +------------------+------------++ AV Area (VTI):    0.66 cm     +------------------+------------++ AV Vmax:          431.00 cm/s  +------------------+------------++ AV Vmean:         299.800 cm/s +------------------+------------++ AV VTI:           1.122 m      +------------------+------------++ AV Peak Grad:     74.3 mmHg    +------------------+------------++ AV Mean  Grad:     44.0 mmHg    +------------------+------------++ LVOT Vmax:        112.00 cm/s  +------------------+------------++ LVOT Vmean:       65.000 cm/s  +------------------+------------++ LVOT VTI:         0.235 m      +------------------+------------++ LVOT/AV VTI ratio:0.21         +------------------+------------++ AR PHT:           430 msec     +------------------+------------++   +-------------+-------++ AORTA                +-------------+-------++ Ao Root diam:3.00 cm +-------------+-------++ Ao Asc diam: 3.30 cm +-------------+-------++  +--------------+----------++  +---------------+-----------++ MITRAL VALVE              TRICUSPID VALVE            +--------------+----------++  +---------------+-----------++ MV Area (PHT):2.22 cm    TR Peak grad:  27.0 mmHg   +--------------+----------++  +---------------+-----------++ MV Peak grad: 9.2 mmHg    TR Vmax:       281.00 cm/s +--------------+----------++  +---------------+-----------++ MV Mean grad: 5.0 mmHg    Estimated RAP: 8.00 mmHg   +--------------+----------++  +---------------+-----------++ MV Vmax:      1.52 m/s    RVSP:          35.0 mmHg   +--------------+----------++  +---------------+-----------++ MV Vmean:     101.0 cm/s +--------------+----------++  +--------------+-------+ MV VTI:       0.59 m      SHUNTS                +--------------+----------++  +--------------+-------+ MV PHT:       99.18 msec  Systemic VTI: 0.24 m  +--------------+----------++  +--------------+-------+ MV Decel Time:342 msec    Systemic Diam:2.00 cm +--------------+----------++  +--------------+-------+ +--------------+-----------++ MV E velocity:142.00 cm/s +--------------+-----------++ MV A velocity:129.00 cm/s +--------------+-----------++ MV E/A ratio: 1.10        +--------------+-----------++   +---------+-------+ IVC              +---------+-------+ IVC diam:1.78 cm +---------+-------+  Cardiac cath 04/09/19:  There is severe aortic valve stenosis.  Hemodynamic findings consistent with mild pulmonary hypertension. LV end diastolic pressure is moderately elevated.  Angiographically normal coronary arteries, very tortuous  Recent Labs: 11/20/2018: ALT 16; TSH 2.34 04/06/2019: BUN 15; Creatinine, Ser 0.66; Platelets 221 04/09/2019: Hemoglobin 11.2; Potassium 3.8; Sodium 142   Lipid Panel    Component Value Date/Time   CHOL  181 11/20/2018 0940   TRIG 147.0 11/20/2018 0940   HDL 46.90 11/20/2018 0940   CHOLHDL 4 11/20/2018 0940   VLDL 29.4 11/20/2018 0940   LDLCALC 105 (H) 11/20/2018 0940     Wt Readings from Last 3 Encounters:  04/16/19 179 lb 6.4 oz (81.4 kg)  04/09/19 177 lb (80.3 kg)  03/27/19 178 lb 12.8 oz (81.1 kg)     Other studies Reviewed: Additional studies/ records that were reviewed today include: echo images, cath images, office notes Review of the above records demonstrates: severe AS   Assessment and Plan:   1. Severe Aortic Valve Stenosis: She has severe, stage D aortic valve stenosis. I have personally reviewed the echo images. The aortic valve is thickened, calcified with limited leaflet mobility. I think she would benefit from AVR. Given advanced age, she is not a good candidate for conventional AVR by surgical approach. I think she may be a good candidate for TAVR.   STS Risk Score: Risk of Mortality: 1.252% Renal Failure: 0.733% Permanent Stroke: 1.163% Prolonged Ventilation: 3.321% DSW Infection: 0.048% Reoperation: 3.413% Morbidity or Mortality: 6.674% Short Length of Stay: 48.088% Long Length of Stay: 2.405%  I have reviewed the natural history of aortic stenosis with the patient and their family members  who are present today. We have discussed the limitations of medical therapy and the poor prognosis associated with  symptomatic aortic stenosis. We have reviewed potential treatment options, including palliative medical therapy, conventional surgical aortic valve replacement, and transcatheter aortic valve replacement. We discussed treatment options in the context of the patient's specific comorbid medical conditions.   She would like to proceed with planning for TAVR. Her cardiac catheterization has been completed. Risks and benefits of the valve procedure are reviewed with the patient. We will arrange a cardiac CT, CTA of the chest/abdomen and pelvis, carotid artery dopplers, PT assessment and she will then be referred to see one of the CT surgeons on our TAVR team.      Current medicines are reviewed at length with the patient today.  The patient does not have concerns regarding medicines.  The following changes have been made:  no change  Labs/ tests ordered today include:  No orders of the defined types were placed in this encounter.  Disposition:   FU with the valve team.   Signed, Lauree Chandler, MD 04/16/2019 2:46 PM    [address], [place], [place]  [id number] Phone: [phone]; Fax: [phone]

## 2019-04-17 ENCOUNTER — Ambulatory Visit (HOSPITAL_COMMUNITY)
Admission: RE | Admit: 2019-04-17 | Discharge: 2019-04-17 | Disposition: A | Discharge status: Home / Self Care | Payer: PPO | Source: Ambulatory Visit | Attending: Cardiovascular Disease | Admitting: Cardiovascular Disease

## 2019-04-17 ENCOUNTER — Ambulatory Visit (HOSPITAL_BASED_OUTPATIENT_CLINIC_OR_DEPARTMENT_OTHER)
Admission: RE | Admit: 2019-04-17 | Discharge: 2019-04-17 | Disposition: A | Discharge status: Home / Self Care | Payer: PPO | Source: Ambulatory Visit | Attending: Cardiovascular Disease | Admitting: Cardiovascular Disease

## 2019-04-17 DIAGNOSIS — I350 Nonrheumatic aortic (valve) stenosis: Secondary | ICD-10-CM

## 2019-04-17 DIAGNOSIS — I700 Atherosclerosis of aorta: Secondary | ICD-10-CM | POA: Diagnosis not present

## 2019-04-17 DIAGNOSIS — Z01818 Encounter for other preprocedural examination: Secondary | ICD-10-CM | POA: Diagnosis not present

## 2019-04-17 MED ORDER — IOHEXOL 350 MG/ML SOLN
100.0000 mL | Freq: Once | INTRAVENOUS | Status: AC | PRN
  Administered 2019-04-17: 100 mL via INTRAVENOUS

## 2019-04-17 NOTE — Progress Notes (Signed)
Carotid duplex       has been completed. Preliminary results can be found under CV proc through chart review. Maynard David, BS, RDMS, RVT   

## 2019-04-21 ENCOUNTER — Other Ambulatory Visit: Payer: Self-pay

## 2019-04-21 DIAGNOSIS — I350 Nonrheumatic aortic (valve) stenosis: Secondary | ICD-10-CM

## 2019-04-22 ENCOUNTER — Other Ambulatory Visit: Payer: Self-pay

## 2019-04-22 ENCOUNTER — Institutional Professional Consult (permissible substitution): Payer: PPO | Admitting: Surgery

## 2019-04-22 ENCOUNTER — Encounter: Payer: Self-pay | Admitting: Physical Therapy

## 2019-04-22 ENCOUNTER — Encounter: Payer: Self-pay | Admitting: Surgery

## 2019-04-22 ENCOUNTER — Ambulatory Visit: Payer: PPO | Attending: Cardiovascular Disease | Admitting: Physical Therapy

## 2019-04-22 VITALS — BP 148/77 | Temp 97.5°F | Resp 16 | Ht 65.0 in | Wt 179.0 lb

## 2019-04-22 DIAGNOSIS — R2689 Other abnormalities of gait and mobility: Secondary | ICD-10-CM

## 2019-04-22 DIAGNOSIS — I350 Nonrheumatic aortic (valve) stenosis: Secondary | ICD-10-CM | POA: Diagnosis not present

## 2019-04-22 NOTE — Therapy (Signed)
[place], [place], [address] Phone: [phone]   Fax:  [phone]  Physical Therapy Evaluation  Patient Details  Name: Veronica Greene MRN: [id number] Date of Birth: 09-07-1945 Referring Provider (PT): Lauree Chandler MD   Encounter Date: 04/22/2019  PT End of Session - 04/22/19 1142    Visit Number  1    Number of Visits  1    Date for PT Re-Evaluation  04/22/19    PT Start Time  1105    PT Stop Time  1142    PT Time Calculation (min)  37 min    Activity Tolerance  Patient tolerated treatment well    Behavior During Therapy  [facility] for tasks assessed/performed       Past Medical History:  Diagnosis Date  . Acute sinusitis 09/13/2014  . Allergic rhinitis   . Anxiety and depression 08/26/2007   Qualifier: Diagnosis of  By: Glori Bickers MD, Carmell Austria   . Arthritis    knees  . Back pain 02/16/2013  . Constipation, slow transit 11/03/2010  . CONTACT DERMATITIS 08/25/2010   Qualifier: Diagnosis of  By: Glori Bickers MD, Carmell Austria   . Contusion of breast, initial encounter 01/13/2019  . Degenerative disk disease 08/31/2011  . Depression with anxiety   . DERMATITIS, SEBORRHEIC 08/25/2010   Qualifier: Diagnosis of  By: Glori Bickers MD, Carmell Austria   . DIZZINESS OR VERTIGO 08/26/2007   Qualifier: Diagnosis of  By: Glori Bickers MD, Carmell Austria   . Dysuria 08/19/2009   Qualifier: Diagnosis of  By: Glori Bickers MD, Carmell Austria   . Ear itching 01/13/2019  . EDEMA, CHRONIC 08/26/2007   Qualifier: Diagnosis of  By: Glori Bickers MD, Carmell Austria   . Elevated random blood glucose level 11/11/2017   168  . Endometrial cancer [facility])    02/ 2019  Diagnosed with D&C/hysteroscopy with polypectomy  . Essential hypertension 08/29/2017  . Fibromyalgia    tx mobic  . Full dentures   . GERD 08/26/2007   Qualifier: Diagnosis of  By: Glori Bickers MD, Carmell Austria   . Gout   . Hearing loss    wears bilateral hearing aids  . History of colon polyps   . History of nonmelanoma skin  cancer    right lower leg  . History of poliomyelitis 08/26/2007   Qualifier: History of  By: Glori Bickers MD, Carmell Austria   . Hyperlipidemia, mild 09/02/2012  . Hypertension   . Hypokalemia 01/02/2011  . IBS (irritable bowel syndrome)    with constipation  . Melanosis coli 11/03/2010  . Obesity 08/26/2007   Qualifier: Diagnosis of  By: Glori Bickers MD, Carmell Austria   . OSTEOARTHRITIS, HANDS, BILATERAL 08/26/2007   Qualifier: Diagnosis of  By: Glori Bickers MD, Carmell Austria   . Osteopenia   . OVERACTIVE BLADDER 08/26/2007   Qualifier: Diagnosis of  By: Glori Bickers MD, Carmell Austria   . Personal history of colonic polyps 11/03/2010   05/2018 13 polyps mostly adenomas, ssp's - recall 1 year 2020 Gatha Mayer, MD, [facility]   . Polio age 41 months   right leg weakness and 1.5 inches shorter than left leg.   Marland Kitchen Poor balance 11/25/2018   With falls  Has rise in R shoe/post polio  Weak legs as well    . Severe aortic stenosis by prior echocardiogram 07/30/2017---  cardiologist-- dr Ellyn Hack   Previously diagnosed with murmur back in 2013. -- Most recent echocardiogram with from 02/17/2019 -> now demonstrates SEVERE AS -mean gradient  44 mmHg (up from 38 mmHg in February 2020)  . Severe calcific aortic stenosis 08/27/2017  . Skin cancer 2019   Right leg  . URINARY INCONTINENCE 08/26/2007   Qualifier: Diagnosis of  By: Glori Bickers MD, Carmell Austria   . Wears hearing aid in both ears     Past Surgical History:  Procedure Laterality Date  . ANKLE SURGERY Right 1956  . CARPAL TUNNEL RELEASE Bilateral 1986, 1993  . COLONOSCOPY N/A 06/24/2015   Procedure: COLONOSCOPY;  Surgeon: Gatha Mayer, MD;  Location: [facility];  Service: Endoscopy;  Laterality: N/A;  . DILATATION & CURETTAGE/HYSTEROSCOPY WITH MYOSURE N/A 08/09/2017   Procedure: DILATATION & CURETTAGE/HYSTEROSCOPY WITH MYOSURE;  Surgeon: Christophe Louis, MD;  Location: [facility];  Service: Gynecology;  Laterality: N/A;  Polypectomy  . DILATION AND CURETTAGE OF UTERUS     PMB  . HAND SURGERY Bilateral  1999   Thumbs   . KNEE SURGERY Bilateral 1998   x2  . LYMPH NODE BIOPSY N/A 10/08/2017   Procedure: LYMPH NODE BIOPSY;  Surgeon: [name], MD;  Location: [facility];  Service: Gynecology;  Laterality: N/A;  . MULTIPLE TOOTH EXTRACTIONS    . NASAL SINUS SURGERY  1976  . polio surgery.     right foot - right leg 1.5" shorter than left leg - some wekness  . RIGHT/LEFT HEART CATH AND CORONARY ANGIOGRAPHY N/A 04/09/2019   Procedure: RIGHT/LEFT HEART CATH AND CORONARY ANGIOGRAPHY;  Surgeon: Leonie Man, MD;  Location: [facility];  Service: Cardiovascular;  Laterality: N/A;  . ROBOTIC ASSISTED TOTAL HYSTERECTOMY WITH BILATERAL SALPINGO OOPHERECTOMY Bilateral 10/08/2017   Procedure: XI ROBOTIC ASSISTED TOTAL HYSTERECTOMY WITH BILATERAL SALPINGO OOPHORECTOMY;  Surgeon: [name], MD;  Location: [facility];  Service: Gynecology;  Laterality: Bilateral;  . THUMB ARTHROSCOPY  1999  . TONSILLECTOMY  1571   73 years old  . TRANSTHORACIC ECHOCARDIOGRAM  1/'19-2/'20   a) mild LVH, ef 60-65%, grade 2 DD, Mod AoV thickening /mild calcified w/ Mod AS/Mild AR (valve area 1.15cm^2, mean gradiant 42mHg, peak grandient 414mg)/ mild LAE and RAE/ trivial TR; b) 08/2018: Mod LVH. Mod-Severe AS (mild AI) - mean gradient now 38 mmHg. Mild-Mod MAC. No MS.  --notable progression of disease  . TRANSTHORACIC ECHOCARDIOGRAM  02/17/2019   Mod LVH. GR 2 DD. Severe AS (mean gradient = 44 mmHg with Mod AI. mild MS.  . TUBAL LIGATION    . UPPER GI ENDOSCOPY     normal  . WISDOM TOOTH EXTRACTION      There were no vitals filed for this visit.   Subjective Assessment - 04/22/19 1109    Subjective  pt is a 73.o with CC of general fatigue which is worse in the afternoon that has been going on for about 1 year. she denies any SOB or dizziniess. pt denies any pain or other issues.    Patient Stated Goals  to get heart better    Currently in Pain?  No/denies         [facility] - 04/22/19 0001       Assessment   Medical Diagnosis  severe aortic stenosis    Referring Provider (PT)  chLauree ChandlerD    Onset Date/Surgical Date  --   1 year   Hand Dominance  Right      Precautions   Precautions  None      Restrictions   Weight Bearing Restrictions  No      Balance Screen  Has the patient fallen in the past 6 months  Yes    How many times?  6    Has the patient had a decrease in activity level because of a fear of falling?   No    Is the patient reluctant to leave their home because of a fear of falling?   No      Home Film/video editor residence    Living Arrangements  Spouse/significant other    Available Help at Discharge  Family    Type of [place] to enter    Entrance Stairs-Number of Steps  2    Entrance Stairs-Rails  Can reach both    [place]  One level    [place] - single point      ROM / Strength   AROM / PROM / Strength  AROM;Strength      AROM   Overall AROM   Within functional limits for tasks performed    Overall AROM Comments  limited R ankle DF       Strength   Overall Strength  Within functional limits for tasks performed    Overall Strength Comments  limited R ankle DF strength    Right Hand Grip (lbs)  38    Left Hand Grip (lbs)  38      Ambulation/Gait   Ambulation/Gait  Yes    Assistive device  --   pt has built-up R shoe sole  by 1 3/4 inch (per pt report)   Gait Pattern  Step-through pattern;Decreased step length - left;Decreased stance time - right;Antalgic;Decreased trunk rotation;Trendelenburg       [facility] Pre-Surgical Assessment - 04/22/19 0001    5 Meter Walk Test- trial 1  5 sec    5 Meter Walk Test- trial 2  5 sec.     5 Meter Walk Test- trial 3  4 sec.    5 meter walk test average  4.67 sec    4 Stage Balance Test tolerated for:   10 sec.    4 Stage Balance Test Position  4    comment  increased postural sway    Sit To Stand Test- trial 1  17 sec.     ADL/IADL Independent with:  Bathing;Dressing;Meal prep;Finances;Yard work    ADL/IADL Therapist, sports Index  Vulnerable    6 Minute Walk- Baseline  yes    BP (mmHg)  159/71    HR (bpm)  61    02 Sat (%RA)  97 %    Modified Borg Scale for Dyspnea  0- Nothing at all    Perceived Rate of Exertion (Borg)  7- Very, very light    6 Minute Walk Post Test  yes    BP (mmHg)  173/76    HR (bpm)  91    02 Sat (%RA)  97 %    Modified Borg Scale for Dyspnea  1- Very mild shortness of breath    Perceived Rate of Exertion (Borg)  11- Fairly light    Aerobic Endurance Distance Walked  792    Endurance additional comments  pt is 48.74               Objective measurements completed on examination: See above findings.              PT Education - 04/22/19 1143    Education Details  to avoid rushing to prevent potential  tripping / falling due to hx of falling. answered quetstion regarding R hip tightness with prolonged long sitting ( in recliner)    Person(s) Educated  Patient    Methods  Explanation;Verbal cues    Comprehension  Verbalized understanding;Verbal cues required                  Plan - 04/22/19 1143    Clinical Impression Statement  see assessment in note    Stability/Clinical Decision Making  Stable/Uncomplicated    Clinical Decision Making  Low    PT Frequency  One time visit    PT Next Visit Plan  PRE-TAVR assessment    Consulted and Agree with Plan of Care  Patient      Clinical Impression Statement: Pt is a 73 yo F presenting to OP PT for evaluation prior to possible TAVR surgery due to severe aortic stenosis. Pt reports onset of fatigue approximately 12 months ago. Symptoms are limiting prlongd standing / walking especially in the PM. Pt presents with good ROM and strength, good balance and is low at high fall risk 4 stage balance test, fair walking speed and limited aerobic endurance per 6 minute walk test. Pt ambulated 792 feet without requiring a rest  break. At end of testing of rest, patient's HR was 91 bpm and O2 was 97 on room air. Pt reported 1/10 shortness of breath on modified scale for dyspnea.Pt ambulated a total of 792 feet in 6 minute walk. fatigue increased significantly with 6 minute walk test. Based on the Short Physical Performance Battery, patient has a frailty rating of 9/12 with </= 5/12 considered frail.     Patient demonstrated the following deficits and impairments:     Visit Diagnosis: Other abnormalities of gait and mobility     Problem List Patient Active Problem List   Diagnosis Date Noted  . Contusion of breast, initial encounter 01/13/2019  . Ear itching 01/13/2019  . Poor balance 11/25/2018  . Genetic testing 06/23/2018  . Elevated random blood glucose level 11/11/2017  . Endometrial cancer ([place]) 09/12/2017  . Essential hypertension 08/29/2017  . Severe calcific aortic stenosis 08/27/2017  . Routine general medical examination at a health care facility 10/15/2015  . Encounter for Medicare annual wellness exam 09/30/2013  . Back pain 02/16/2013  . Hyperlipidemia, mild 09/02/2012  . Degenerative disk disease 08/31/2011  . Hypokalemia 01/02/2011  . Personal history of colonic polyps 11/03/2010  . Constipation, slow transit 11/03/2010  . Melanosis coli 11/03/2010  . Gout 08/25/2010  . DERMATITIS, SEBORRHEIC 08/25/2010  . Osteopenia 09/05/2007  . History of poliomyelitis 08/26/2007  . Obesity 08/26/2007  . Anxiety and depression 08/26/2007  . ALLERGIC RHINITIS 08/26/2007  . GERD 08/26/2007  . IBS 08/26/2007  . OVERACTIVE BLADDER 08/26/2007  . OSTEOARTHRITIS, HANDS, BILATERAL 08/26/2007  . EDEMA, CHRONIC 08/26/2007  . URINARY INCONTINENCE 08/26/2007   Starr Lake PT, DPT, LAT, ATC  04/22/19  11:47 AM      [facility] [address] [place], [place], [address] Phone: [phone]   Fax:  [phone]  Name: Veronica Greene MRN:  [id number] Date of Birth: 1946/01/31

## 2019-04-22 NOTE — Progress Notes (Signed)
Patient ID: Veronica Greene, female   DOB: 11/30/1945, 73 y.o.   MRN: [id number]  [place] [facility] REPORT  Referring Provider is Leonie Man, MD Primary Cardiologist is Glenetta Hew, MD PCP is Tower, [name], MD  Chief Complaint  Patient presents with  . Aortic Stenosis    TAVR EVAL...all studies complete except for PT EVAL which will be done later today    HPI:  The patient is a 73 year old woman with history of hypertension, hyperlipidemia, fibromyalgia, post polio syndrome on right leg weakness and decreased length, and aortic stenosis first diagnosed around 2013 when she was noted to have a heart murmur.  This has progressed over the past 18 months going from moderate with a 23 mm mean gradient in January 2019 to severe on the most current study with a gradient of 44 mmHg.  There is also moderate aortic insufficiency.  Ventricular systolic function is normal.  She reports at least a 1 year history of progressive fatigue and tiredness.  She said that she feels fairly well in the morning but by about 2:00 the afternoon is exhausted.  She said that she may have some mild exertional dyspnea.  She denies any chest pain or pressure.  She has had mild lower extremity edema at times.  She has had occasional episodes of dizziness.  She is here today with her husband.  This is her second husband that she has been married a few years.  He is retired and enjoys doing things around the house as well as traveling and camping with her husband.    Past Medical History:  Diagnosis Date  . Acute sinusitis 09/13/2014  . Allergic rhinitis   . Anxiety and depression 08/26/2007   Qualifier: Diagnosis of  By: Glori Bickers MD, Carmell Austria   . Arthritis    knees  . Back pain 02/16/2013  . Constipation, slow transit 11/03/2010  . CONTACT DERMATITIS 08/25/2010   Qualifier: Diagnosis of  By: Glori Bickers MD, Carmell Austria   . Contusion  of breast, initial encounter 01/13/2019  . Degenerative disk disease 08/31/2011  . Depression with anxiety   . DERMATITIS, SEBORRHEIC 08/25/2010   Qualifier: Diagnosis of  By: Glori Bickers MD, Carmell Austria   . DIZZINESS OR VERTIGO 08/26/2007   Qualifier: Diagnosis of  By: Glori Bickers MD, Carmell Austria   . Dysuria 08/19/2009   Qualifier: Diagnosis of  By: Glori Bickers MD, Carmell Austria   . Ear itching 01/13/2019  . EDEMA, CHRONIC 08/26/2007   Qualifier: Diagnosis of  By: Glori Bickers MD, Carmell Austria   . Elevated random blood glucose level 11/11/2017   168  . Endometrial cancer [facility])    02/ 2019  Diagnosed with D&C/hysteroscopy with polypectomy  . Essential hypertension 08/29/2017  . Fibromyalgia    tx mobic  . Full dentures   . GERD 08/26/2007   Qualifier: Diagnosis of  By: Glori Bickers MD, Carmell Austria   . Gout   . Hearing loss    wears bilateral hearing aids  . History of colon polyps   . History of nonmelanoma skin cancer    right lower leg  . History of poliomyelitis 08/26/2007   Qualifier: History of  By: Glori Bickers MD, Carmell Austria   . Hyperlipidemia, mild 09/02/2012  . Hypertension   . Hypokalemia 01/02/2011  . IBS (irritable bowel syndrome)    with constipation  . Melanosis coli 11/03/2010  . Obesity 08/26/2007   Qualifier: Diagnosis of  ByGlori Bickers MD, Carmell Austria   . OSTEOARTHRITIS, HANDS, BILATERAL 08/26/2007   Qualifier: Diagnosis of  By: Glori Bickers MD, Carmell Austria   . Osteopenia   . OVERACTIVE BLADDER 08/26/2007   Qualifier: Diagnosis of  By: Glori Bickers MD, Carmell Austria   . Personal history of colonic polyps 11/03/2010   05/2018 13 polyps mostly adenomas, ssp's - recall 1 year 2020 Gatha Mayer, MD, [facility]   . Polio age 72 months   right leg weakness and 1.5 inches shorter than left leg.   Marland Kitchen Poor balance 11/25/2018   With falls  Has rise in R shoe/post polio  Weak legs as well    . Severe aortic stenosis by prior echocardiogram 07/30/2017---  cardiologist-- dr Ellyn Hack   Previously diagnosed with murmur back in 2013. -- Most recent echocardiogram with  from 02/17/2019 -> now demonstrates SEVERE AS -mean gradient 44 mmHg (up from 38 mmHg in February 2020)  . Severe calcific aortic stenosis 08/27/2017  . Skin cancer 2019   Right leg  . URINARY INCONTINENCE 08/26/2007   Qualifier: Diagnosis of  By: Glori Bickers MD, Carmell Austria   . Wears hearing aid in both ears     Past Surgical History:  Procedure Laterality Date  . ANKLE SURGERY Right 1956  . CARPAL TUNNEL RELEASE Bilateral 1986, 1993  . COLONOSCOPY N/A 06/24/2015   Procedure: COLONOSCOPY;  Surgeon: Gatha Mayer, MD;  Location: [facility];  Service: Endoscopy;  Laterality: N/A;  . DILATATION & CURETTAGE/HYSTEROSCOPY WITH MYOSURE N/A 08/09/2017   Procedure: DILATATION & CURETTAGE/HYSTEROSCOPY WITH MYOSURE;  Surgeon: Christophe Louis, MD;  Location: [facility];  Service: Gynecology;  Laterality: N/A;  Polypectomy  . DILATION AND CURETTAGE OF UTERUS     PMB  . HAND SURGERY Bilateral 1999   Thumbs   . KNEE SURGERY Bilateral 1998   x2  . LYMPH NODE BIOPSY N/A 10/08/2017   Procedure: LYMPH NODE BIOPSY;  Surgeon: [name], MD;  Location: [facility];  Service: Gynecology;  Laterality: N/A;  . MULTIPLE TOOTH EXTRACTIONS    . NASAL SINUS SURGERY  1976  . polio surgery.     right foot - right leg 1.5" shorter than left leg - some wekness  . RIGHT/LEFT HEART CATH AND CORONARY ANGIOGRAPHY N/A 04/09/2019   Procedure: RIGHT/LEFT HEART CATH AND CORONARY ANGIOGRAPHY;  Surgeon: Leonie Man, MD;  Location: [facility];  Service: Cardiovascular;  Laterality: N/A;  . ROBOTIC ASSISTED TOTAL HYSTERECTOMY WITH BILATERAL SALPINGO OOPHERECTOMY Bilateral 10/08/2017   Procedure: XI ROBOTIC ASSISTED TOTAL HYSTERECTOMY WITH BILATERAL SALPINGO OOPHORECTOMY;  Surgeon: [name], MD;  Location: [facility];  Service: Gynecology;  Laterality: Bilateral;  . THUMB ARTHROSCOPY  1999  . TONSILLECTOMY  4963   73 years old  . TRANSTHORACIC ECHOCARDIOGRAM  1/'19-2/'20   a) mild LVH, ef 60-65%, grade 2 DD, Mod AoV thickening /mild  calcified w/ Mod AS/Mild AR (valve area 1.15cm^2, mean gradiant 72mHg, peak grandient 432mg)/ mild LAE and RAE/ trivial TR; b) 08/2018: Mod LVH. Mod-Severe AS (mild AI) - mean gradient now 38 mmHg. Mild-Mod MAC. No MS.  --notable progression of disease  . TRANSTHORACIC ECHOCARDIOGRAM  02/17/2019   Mod LVH. GR 2 DD. Severe AS (mean gradient = 44 mmHg with Mod AI. mild MS.  . TUBAL LIGATION    . UPPER GI ENDOSCOPY     normal  . WISDOM TOOTH EXTRACTION      Family History  Problem Relation Age of Onset  . Kidney disease Mother   .  Alzheimer's disease Mother   . Stroke Father 67  . Colon cancer Father 25       possibly colon cancer, possibly just polyps  . Kidney disease Sister   . Heart attack Sister   . Colon cancer Paternal Aunt 52  . Cancer Paternal Uncle        unk type  . Esophageal cancer Neg Hx   . Stomach cancer Neg Hx     Social History   Socioeconomic History  . Marital status: Married    Spouse name: Not on file  . Number of children: 2  . Years of education: Not on file  . Highest education level: High school graduate  Occupational History  . 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 Financial resource strain: Not on file  . Food insecurity    Worry: Not on file    Inability: Not on file  . Transportation needs    Medical: Not on file    Non-medical: Not on file  Tobacco Use  . Smoking status: Never Smoker  . Smokeless tobacco: Never Used  Substance and Sexual Activity  . Alcohol use: No    Alcohol/week: 0.0 standard drinks  . Drug use: No  . Sexual activity: Yes    Birth control/protection: Post-menopausal  Lifestyle  . Physical activity    Days per week: Not on file    Minutes per session: Not on file  . Stress: Not on file  Relationships  . Social Herbalist on phone: Not on file    Gets together: Not on file    Attends religious service: Not on file    Active member  of club or organization: Not on file    Attends meetings of clubs or organizations: Not on file    Relationship status: Not on file  . Intimate partner violence    Fear of current or ex partner: Not on file    Emotionally abused: Not on file    Physically abused: Not on file    Forced sexual activity: Not on file  Other Topics Concern  . Not on file  Social History Narrative   She is relatively recently remarried.  She has 2  children and 3 grandchildren.     She is currently retired.  But enjoys cleaning house and doing chores.  She likes to do yard work.  Does not routinely exercise.    Current Outpatient Medications  Medication Sig Dispense Refill  . acetaminophen (TYLENOL 8 HOUR ARTHRITIS PAIN) 650 MG CR tablet Take 650 mg by mouth daily as needed for pain.     Marland Kitchen acetaminophen (TYLENOL) 325 MG tablet Take 650 mg by mouth every 6 (six) hours as needed (for pain.).    Marland Kitchen acetic acid-hydrocortisone (ACETASOL HC) OTIC solution Place 2 drops into both ears 2 (two) times daily as needed (itching).    . Ascorbic Acid (VITAMIN C) 1000 MG tablet Take 1,000 mg by mouth daily with breakfast.     . aspirin EC 81 MG tablet Take 81 mg by mouth daily.    . Black Pepper-Turmeric (TURMERIC COMPLEX/BLACK PEPPER PO) Take 15 mLs by mouth daily with breakfast. Liquid form     . Calcium-Phosphorus-Vitamin D (CALCIUM/VITAMIN D3/ADULT GUMMY PO) Take 1 tablet by mouth 2 (two) times daily. Morning & afternoon    . cetirizine (ZYRTEC) 10 MG tablet Take 5 mg by mouth as  needed for allergies (alternates between mucinex & zyrtec for sinus symptoms).     . cholecalciferol (VITAMIN D) 1000 UNITS tablet Take 1,000 Units by mouth daily with breakfast.     . esomeprazole (NEXIUM) 20 MG capsule Take 40 mg by mouth daily before breakfast.    . fluticasone (FLONASE) 50 MCG/ACT nasal spray Place 2 sprays into both nostrils at bedtime.     . GUAIFENESIN 1200 PO Take 1,200 mg by mouth at bedtime. Mucinex    .  hydrochlorothiazide (HYDRODIURIL) 25 MG tablet Take 1 tablet (25 mg total) by mouth daily with lunch. 90 tablet 3  . lubiprostone (AMITIZA) 24 MCG capsule Take 1 capsule (24 mcg total) by mouth 2 (two) times daily. LUNCH & SUPPER 180 capsule 3  . meloxicam (MOBIC) 7.5 MG tablet TAKE 1 TABLET BY MOUTH AT BEDTIME. 90 tablet 3  . metoprolol succinate (TOPROL XL) 25 MG 24 hr tablet Take 1 tablet (25 mg total) by mouth daily. 90 tablet 3  . Multiple Vitamin (MULTIVITAMIN WITH MINERALS) TABS tablet Take 1 tablet by mouth daily. WOMEN'S MULTIVITAMIN    . Polyethyl Glycol-Propyl Glycol (SYSTANE) 0.4-0.3 % SOLN Place 1-2 drops into both eyes 2 (two) times daily.     . potassium chloride (K-DUR) 10 MEQ tablet Take 2 tablets (20 mEq total) by mouth 2 (two) times daily. 360 tablet 3  . Simethicone (PHAZYME PO) Take 1 tablet by mouth at bedtime as needed (for gas (typically one tablet at night)).     . sodium chloride (AYR) 0.65 % nasal spray Place 1 spray into the nose daily.     Marland Kitchen venlafaxine XR (EFFEXOR XR) 75 MG 24 hr capsule Take 1 capsule (75 mg total) by mouth daily after breakfast. 90 capsule 3   No current facility-administered medications for this visit.     Allergies  Allergen Reactions  . Clarithromycin Swelling    throat swells  . Codeine Nausea And Vomiting  . Paroxetine Other (See Comments)    Ineffective.  Marland Kitchen Penicillins Hives    welts Has patient had a PCN reaction causing immediate rash, facial/tongue/throat swelling, SOB or lightheadedness with hypotension: No Has patient had a PCN reaction causing severe rash involving mucus membranes or skin necrosis: Unknown Has patient had a PCN reaction that required hospitalization:No Has patient had a PCN reaction occurring within the last 10 years: No If all of the above answers are "NO", then may proceed with Cephalosporin use.       Review of Systems:   General:  normal appetite, + decreased energy, no weight gain, + weight loss, no  fever  Cardiac:  no chest pain with exertion, no chest pain at rest, +SOB with moderate exertion, no resting SOB, no PND, no orthopnea, no palpitations, no arrhythmia, no atrial fibrillation, + LE edema, + dizzy spells, no syncope  Respiratory:  + shortness of breath, no home oxygen, no productive cough, no dry cough, no bronchitis, no wheezing, no hemoptysis, no asthma, no pain with inspiration or cough, no sleep apnea, no CPAP at night  GI:   + difficulty swallowing, no reflux, nop frequent heartburn, no hiatal hernia, no abdominal pain, no constipation, no diarrhea, no hematochezia, no hematemesis, no melena  GU:   + dysuria,  no frequency, + urinary tract infection, no hematuria, no kidney stones, no kidney disease  Vascular:  no pain suggestive of claudication, no pain in feet, no leg cramps, no varicose veins, no DVT, no non-healing foot ulcer  Neuro:  no stroke, no TIA's, no seizures, no headaches, no temporary blindness one eye,  no slurred speech, no peripheral neuropathy, no chronic pain, no instability of gait, no memory/cognitive dysfunction  Musculoskeletal: + arthritis, no joint swelling, + myalgias, + difficulty walking, + reduced mobility   Skin:   no rash, no itching, no skin infections, no pressure sores or ulcerations  Psych:   no anxiety, no depression, no nervousness, no unusual recent stress  Eyes:   no blurry vision, + floaters, no recent vision changes, + wears glasses or contacts  ENT:   + hearing loss, no loose or painful teeth, + dentures,  Hematologic:  + easy bruising, no abnormal bleeding, no clotting disorder, no frequent epistaxis  Endocrine:  no diabetes, does not check CBG's at home        Physical Exam:   BP (!) 148/77 (BP Location: Left Arm, Patient Position: Sitting, Cuff Size: Normal)   Temp (!) 97.5 F (36.4 C)   Resp 16   Ht _0  (1.651 m)   Wt 179 lb (81.2 kg)   SpO2 96% Comment: RA  BMI 29.79 kg/m   General:  well-appearing  HEENT:   Unremarkable, NCAT, PERLA, EOMI  Neck:   no JVD, no bruits, no adenopathy or thyromegaly  Chest:   clear to auscultation, symmetrical breath sounds, no wheezes, no rhonchi   CV:   RRR, grade III/VI crescendo/decrescendo murmur heard best at RSB,  no diastolic murmur  Abdomen:  soft, non-tender, no masses or organomegaly  Extremities:  warm, well-perfused, pulses palpable in feet, no LE edema  Rectal/GU  Deferred  Neuro:   Grossly non-focal and symmetrical throughout  Skin:   Clean and dry, no rashes, no breakdown   Diagnostic Tests:  ECHOCARDIOGRAM REPORT       Patient Name:   Levora FAUCETTE [facility] Date of Exam: 02/17/2019 Medical Rec #:  [id number]            Height:       67.0 in Accession #:    [id number]           Weight:       181.4 lb Date of Birth:  07/04/1946            BSA:          1.94 m Patient Age:    30 years             BP:           140/68 mmHg Patient Gender: F                    HR:           69 bpm. Exam Location:  [address]    Procedure: 2D Echo, 3D Echo, Cardiac Doppler, Color Doppler and Strain Analysis  Indications:    I35 Aortic stenosis.   History:        Patient has prior history of Echocardiogram examinations, most                 recent 08/29/2018. Aortic Valve Disease Risk Factors:                 Hypertension, Dyslipidemia and Edema, Obesity.   Sonographer:    Jessee Avers, RDCS Referring Phys: [place]    1. The left ventricle has normal systolic function with an ejection fraction of 60-65%. The cavity size was normal. Left ventricular diastolic Doppler parameters  are consistent with pseudonormalization. Elevated left ventricular end-diastolic pressure.  2. The right ventricle has normal systolic function. The cavity was normal. There is no increase in right ventricular wall thickness. Right ventricular systolic pressure is mildly elevated with an estimated pressure of 35.0 mmHg.  3. The average left ventricular  global longitudinal strain is -19.0 %.  4. Left atrial size was mildly dilated.  5. The mitral valve is degenerative in appearance. Moderate thickening of the mitral valve leaflet. Mild calcification of the mitral valve leaflet. There is moderate mitral annular calcification present. Mitral valve regurgitation is mild by color flow  Doppler. Mild mitral valve stenosis. MV Mean grad: 5.0 mmHg. MV Area (PHT): 2.22 cm.  6. The aorta is normal in size and structure.  7. The aortic valve is tricuspid Severely thickening of the aortic valve. Severe calcifcation of the aortic valve. Aortic valve regurgitation is moderate by color flow Doppler. There is Severe stenosis of the aortic valve, with a calculated valve area  of 0.66 cm. Severe aortic annular calcification noted. AV Mean Grad: 44.0 mmHg. LVOT/AV VTI ratio: 0.21  8. Compared to prior echo the aortic stenosis has progressed (mean AVG 38>>100mHg and dimensionless index 0.31>>0.21). The AI is now moderate.  FINDINGS  Left Ventricle: The left ventricle has normal systolic function, with an ejection fraction of 60-65%. The cavity size was normal. There is no increase in left ventricular wall thickness. Left ventricular diastolic Doppler parameters are consistent with  pseudonormalization. Elevated left ventricular end-diastolic pressure The average left ventricular global longitudinal strain is -19.0 %.  Right Ventricle: The right ventricle has normal systolic function. The cavity was normal. There is no increase in right ventricular wall thickness. Right ventricular systolic pressure is mildly elevated with an estimated pressure of 35.0 mmHg.  Left Atrium: Left atrial size was mildly dilated.  Right Atrium: Right atrial size was normal in size. Right atrial pressure is estimated at 8 mmHg.  Interatrial Septum: No atrial level shunt detected by color flow Doppler.  Pericardium: There is no evidence of pericardial effusion.  Mitral Valve:  The mitral valve is degenerative in appearance. Moderate thickening of the mitral valve leaflet. Mild calcification of the mitral valve leaflet. There is moderate mitral annular calcification present. Mitral valve regurgitation is mild by  color flow Doppler. Mild mitral valve stenosis.  Tricuspid Valve: The tricuspid valve is normal in structure. Tricuspid valve regurgitation is mild by color flow Doppler.  Aortic Valve: The aortic valve is tricuspid Severely thickening of the aortic valve. Severe calcifcation of the aortic valve. Aortic valve regurgitation is moderate by color flow Doppler. There is Severe stenosis of the aortic valve, with a calculated  valve area of 0.66 cm. Severe aortic annular calcification noted.  Pulmonic Valve: The pulmonic valve was normal in structure. Pulmonic valve regurgitation is trivial by color flow Doppler.  Aorta: The aorta is normal in size and structure.  Venous: The inferior vena cava measures 1.78 cm, is normal in size with greater than 50% respiratory variability.  Compared to previous exam: 08/29/18 EF 65%. PA pressure 313mg. Moderate-severe AS 3843m mean, 89m71mpeak.    +--------------+--------++ LEFT VENTRICLE         +----------------+---------++ +--------------+--------++ Diastology                PLAX 2D                +----------------+---------++ +--------------+--------++ LV e' lateral:  5.85 cm/s LVIDd:  4.80 cm  +----------------+---------++ +--------------+--------++ LV E/e' lateral:24.3      LVIDs:        3.45 cm  +----------------+---------++ +--------------+--------++ LV e' medial:   4.95 cm/s LV PW:        1.00 cm  +----------------+---------++ +--------------+--------++ LV E/e' medial: 28.7      LV IVS:       0.90 cm  +----------------+---------++ +--------------+--------++ LVOT diam:    2.00 cm  +----------------------+-------++ +--------------+--------++ 2D  Longitudinal Strain        LV SV:        58 ml    +----------------------+-------++ +--------------+--------++ 2D Strain GLS (A2C):  -16.6 % LV SV Index:  29.30    +----------------------+-------++ +--------------+--------++ 2D Strain GLS (A3C):  -19.8 % LVOT Area:    3.14 cm +----------------------+-------++ +--------------+--------++ 2D Strain GLS (A4C):  -20.7 %                        +----------------------+-------++ +--------------+--------++ 2D Strain GLS Avg:    -19.0 %                            +----------------------+-------++                              +-------------+------++                            3D Volume EF:                                  +-------------+------++                            3D EF:       59 %                              +-------------+------++                            LV EDV:      118 ml                            +-------------+------++                            LV ESV:      48 ml                             +-------------+------++                            LV SV:       70 ml                             +-------------+------++  +---------------+---------++ RIGHT VENTRICLE          +---------------+---------++ RV S prime:    9.60 cm/s +---------------+---------++ TAPSE (M-mode):1.9 cm    +---------------+---------++ RVSP:  35.0 mmHg +---------------+---------++  +---------------+-------++-----------++ LEFT ATRIUM           Index       +---------------+-------++-----------++ LA diam:       4.40 cm2.27 cm/m  +---------------+-------++-----------++ LA Vol (A2C):  52.3 ml26.96 ml/m +---------------+-------++-----------++ LA Vol (A4C):  66.1 ml34.07 ml/m +---------------+-------++-----------++ LA Biplane Vol:58.8 ml30.31 ml/m +---------------+-------++-----------++ +------------+---------++-----------++ RIGHT ATRIUM          Index       +------------+---------++-----------++ RA Pressure:8.00 mmHg            +------------+---------++-----------++ RA Area:    10.20 cm            +------------+---------++-----------++ RA Volume:  19.60 ml 10.10 ml/m +------------+---------++-----------++  +------------------+------------++ AORTIC VALVE                   +------------------+------------++ AV Area (Vmax):   0.82 cm     +------------------+------------++ AV Area (Vmean):  0.68 cm     +------------------+------------++ AV Area (VTI):    0.66 cm     +------------------+------------++ AV Vmax:          431.00 cm/s  +------------------+------------++ AV Vmean:         299.800 cm/s +------------------+------------++ AV VTI:           1.122 m      +------------------+------------++ AV Peak Grad:     74.3 mmHg    +------------------+------------++ AV Mean Grad:     44.0 mmHg    +------------------+------------++ LVOT Vmax:        112.00 cm/s  +------------------+------------++ LVOT Vmean:       65.000 cm/s  +------------------+------------++ LVOT VTI:         0.235 m      +------------------+------------++ LVOT/AV VTI ratio:0.21         +------------------+------------++ AR PHT:           430 msec     +------------------+------------++   +-------------+-------++ AORTA                +-------------+-------++ Ao Root diam:3.00 cm +-------------+-------++ Ao Asc diam: 3.30 cm +-------------+-------++  +--------------+----------++  +---------------+-----------++ MITRAL VALVE              TRICUSPID VALVE            +--------------+----------++  +---------------+-----------++ MV Area (PHT):2.22 cm    TR Peak grad:  27.0 mmHg   +--------------+----------++  +---------------+-----------++ MV Peak grad: 9.2 mmHg    TR Vmax:       281.00 cm/s +--------------+----------++   +---------------+-----------++ MV Mean grad: 5.0 mmHg    Estimated RAP: 8.00 mmHg   +--------------+----------++  +---------------+-----------++ MV Vmax:      1.52 m/s    RVSP:          35.0 mmHg   +--------------+----------++  +---------------+-----------++ MV Vmean:     101.0 cm/s +--------------+----------++  +--------------+-------+ MV VTI:       0.59 m      SHUNTS                +--------------+----------++  +--------------+-------+ MV PHT:       99.18 msec  Systemic VTI: 0.24 m  +--------------+----------++  +--------------+-------+ MV Decel Time:342 msec    Systemic Diam:2.00 cm +--------------+----------++  +--------------+-------+ +--------------+-----------++ MV E velocity:142.00 cm/s +--------------+-----------++ MV A velocity:129.00 cm/s +--------------+-----------++ MV E/A ratio: 1.10        +--------------+-----------++  +---------+-------+ IVC              +---------+-------+ IVC diam:1.78  cm +---------+-------+    Fransico Him MD Electronically signed by Fransico Him MD Signature Date/Time: 02/17/2019/12:31:56 PM    Physicians  Panel Physicians Referring Physician Case Authorizing Physician  Leonie Man, MD (Primary)    Procedures  RIGHT/LEFT HEART CATH AND CORONARY ANGIOGRAPHY  Conclusion    There is severe aortic valve stenosis.  Hemodynamic findings consistent with mild pulmonary hypertension. LV end diastolic pressure is moderately elevated.  Angiographically normal coronary arteries, very tortuous   SUMMARY  Severe calcific aortic stenosis  -mean aortic gradient 50 mmHg with peak to peak 55 mmHg.  (Estimated valve area 0.76 cm)  Mild pulmonary hypertension secondary to elevated LVEDP and PCWP  Angiographically normal coronary arteries  RECOMMENDATIONS  Standard post cath care and discharge home after bed rest  She will be met by the structural heart/valve clinic team  coordinator Nell Range, [place]) prior to discharge today.  Will be scheduled in the valve clinic.  Follow-up with me as scheduled.  No change in medications.   Glenetta Hew, MD    Recommendations  Antiplatelet/Anticoag Recommend Aspirin 43m daily for moderate CAD.  Indications  Severe aortic stenosis by prior echocardiogram [I35.0 (ICD-10-CM)]  Procedural Details  Technical Details PCP: TAbner GreenspanMD Cardiologist: DGlenetta Hew MD  AKnox RoyaltyKDisbrois a very pleasant [age]year old woman with hypertension and hyperlipidemia who has been followed for aortic stenosis and has had significant progression of aortic stenosis from moderate as of January 2019 mild to moderate-severe February 2020 and then severe with a mean gradient of 45 mmHg in August 2020.  She has had some decreased exercise tolerance and exertional dyspnea.  With Naus significant rapid progression of valve stenosis, I discussed with the structural heart team who recommended proceeding with evaluation for valve replacement.  She now presents for right and left heart catheterization.  Time Out: Verified patient identification, verified procedure, site/side was marked, verified correct patient position, special equipment/implants available, medications/allergies/relevent history reviewed, required imaging and test results available. Performed.  Access:  RIGHT Radial Artery: 6 Fr sheath -- Seldinger technique using Micropuncture Kit -- Direct ultrasound guidance used.  Permanent image obtained and placed on chart. -- 10 mL radial cocktail IA; 4000 Units IV Heparin  * Right Brachial/Antecubital Vein:  5 Fr sheath -- Seldinger technique using Micropuncture Kit -- Direct ultrasound guidance used.  Permanent image obtained and placed on chart.  Right Heart Catheterization: 5 Fr SGordy Councilmancatheter advanced under fluoroscopy with balloon inflated to the RA, RV, then PCWP-PA for hemodynamic measurement.  *  Simultaneous FA & PA blood gases checked for SaO2% to calculate FICK CO/CI  * Catheter removed completely out of the body with balloon deflated.  Left Heart Catheterization: 5Fr Catheters advanced or exchanged over a J-wire under direct fluoroscopic guidance into the ascending aorta; TIG 4.0 catheter advanced first.   * Left& Right Coronary Artery Cineangiography: TIG 4.0 Catheter  * * LV Hemodynamics (no LV Gram): AL-1 Catheter  Upon completion of Angiogaphy, the catheter was removed completely out of the body over a wire, without complication.  Brachial Sheath(s) removed in the Cath Lab with manual pressure for hemostasis.   Radial sheath removed in the Cardiac Catheterization lab with TR Band placed for hemostasis.  TR Band: 1010  Hours; 15 mL air  MEDICATIONS * SQ Lidocaine 332m* Radial Cocktail: 3 mg Verapmil in 10 mL NS * Isovue Contrast: 50 mL * Heparin: 4000 Units  Fluoro time: 4 minutes. Dose Area Product: [id number]. Cumulative  Air Kerma: 184 mGy.   Estimated blood loss <50 mL.   During this procedure medications were administered to achieve and maintain moderate conscious sedation while the patient's heart rate, blood pressure, and oxygen saturation were continuously monitored and I was present face-to-face 100% of this time.  Medications (Filter: Administrations occurring from 04/09/19 0857 to 04/09/19 1008) Medication Rate/Dose/Volume Action  Date Time   midazolam (VERSED) injection (mg) 2 mg Given 04/09/19 0908   Total dose as of 04/09/19 1008 1 mg Given 0943   3 mg        fentaNYL (SUBLIMAZE) injection (mcg) 25 mcg Given 04/09/19 0908   Total dose as of 04/09/19 1008 25 mcg Given 0944   50 mcg        lidocaine (PF) (XYLOCAINE) 1 % injection (mL) 2 mL Given 04/09/19 0931   Total dose as of 04/09/19 1008 2 mL Given 0937   6 mL 2 mL Given 0940   Radial Cocktail/Verapamil only (mL) 10 mL Given 04/09/19 0942   Total dose as of 04/09/19 1008        10 mL         heparin injection (Units) 4,000 Units Given 04/09/19 0949   Total dose as of 04/09/19 1008        4,000 Units        iohexol (OMNIPAQUE) 350 MG/ML injection (mL) 50 mL Given 04/09/19 1004   Total dose as of 04/09/19 1008        50 mL        Heparin (Porcine) in NaCl 1000-0.9 UT/500ML-% SOLN (mL) 500 mL Given 04/09/19 1005   Total dose as of 04/09/19 1008 500 mL Given 1005   1,000 mL        Sedation Time  Sedation Time Physician-1: 52 minutes 12 seconds  Contrast  Medication Name Total Dose  iohexol (OMNIPAQUE) 350 MG/ML injection 50 mL    Radiation/Fluoro  Fluoro time: 4 (min) DAP: 9848 (mGycm2) Cumulative Air Kerma: 419 (mGy)  Complications  Complications documented before study signed (04/09/2019 37:90 AM)   No complications were associated with this study.  Documented by Leonie Man, MD - 04/09/2019 10:27 AM    Coronary Findings  Diagnostic Dominance: Right Left Main  Vessel was injected. Vessel is large. Vessel is angiographically normal.  Left Anterior Descending  Vessel was injected. Vessel is large. Vessel is angiographically normal. The vessel is tortuous. The vessel tapers down to a small caliber vessel after 3rd Diag, and barely reaches the apex  First Diagonal Branch  Vessel was injected. Vessel is small in size. Vessel is angiographically normal.  Second Diagonal Branch  Vessel was injected. Vessel is normal in size. Vessel is moderate to large in size tapering to small caliber vessel Vessel is angiographically normal.  Third Diagonal Branch  Vessel was injected. Vessel is small in size. Vessel is angiographically normal.  Left Circumflex  Vessel was injected. Vessel is moderate in size. Vessel is angiographically normal.  First Obtuse Marginal Branch  Vessel was injected. Vessel is small in size. Vessel is angiographically normal.  Left Posterior Atrioventricular Artery  Vessel was injected. Vessel is small in size. Vessel is angiographically normal.   Right Coronary Artery  Vessel was injected. Vessel is large. Vessel is angiographically normal.  Acute Marginal Branch  Vessel was injected. Vessel is small in size. Vessel is angiographically normal.  Right Ventricular Branch  Vessel was injected. Vessel is small in size. Vessel is angiographically normal.  Right  Posterior Descending Artery  Vessel was injected. Vessel is large in size. Vessel is angiographically normal. The vessel is ectatic.  Right Posterior Atrioventricular Artery  Vessel was injected. Vessel is large in size. Vessel is angiographically normal. The vessel is tortuous.  First Right Posterolateral Branch  Vessel was injected. Vessel is small in size. Vessel is angiographically normal.  Third Right Posterolateral Branch  Vessel was injected. Vessel is small in size. Vessel is angiographically normal.  Intervention  No interventions have been documented. Right Heart  Right Heart Pressures Hemodynamic findings consistent with mild pulmonary hypertension. PA P 38/15 mmHg-mean 29 mmHg PCWP mean 20 mmHg, V wave 32 mmHg (large spiking V wave) Elevated LV EDP consistent with volume overload. LVEDP 20 mmHg Ao P-MAP: 131/58 mmHg - 89 mmHg.  Ao sat 99%, PA sat 77%  Cardiac Output and Index (Fick) 6.67-3.56  Right Atrium The right atrial size is normal. Right atrial pressure is elevated. RA P-10 mmHg  Right Ventricle RVP-EDP 42/6 mmHg - 11 mmHg  Wall Motion  Resting    No LV gram        Left Heart  Left Ventricle LV end diastolic pressure is moderately elevated. LV P-EDP 191/12 mmHg - 20 mmHg.  Aortic Valve There is severe aortic valve stenosis. The aortic valve is calcified.  Coronary Diagrams  Diagnostic Dominance: Right  Intervention  Implants   No implant documentation for this case.  Syngo Images  Show images for CARDIAC CATHETERIZATION  Images on Long Term Storage  Show images for [place], [place] to Procedure Log  Procedure Log     Hemo Data   Most Recent Value  Fick Cardiac Output 6.67 L/min  Fick Cardiac Output Index 3.56 (L/min)/BSA  Aortic Mean Gradient 50.62 mmHg  Aortic Peak Gradient 55 mmHg  Aortic Valve Area 0.76  Aortic Value Area Index 0.41 cm2/BSA  RA A Wave 12 mmHg  RA V Wave 10 mmHg  RA Mean 9 mmHg  RV Systolic Pressure 42 mmHg  RV Diastolic Pressure 6 mmHg  RV EDP 11 mmHg  PA Systolic Pressure 38 mmHg  PA Diastolic Pressure 15 mmHg  PA Mean 29 mmHg  PW A Wave 21 mmHg  PW V Wave 32 mmHg  PW Mean 20 mmHg  AO Systolic Pressure 222 mmHg  AO Diastolic Pressure 58 mmHg  AO Mean 85 mmHg  LV Systolic Pressure 979 mmHg  LV Diastolic Pressure 9 mmHg  LV EDP 19 mmHg  AOp Systolic Pressure 892 mmHg  AOp Diastolic Pressure 58 mmHg  AOp Mean Pressure 89 mmHg  LVp Systolic Pressure 119 mmHg  LVp Diastolic Pressure 12 mmHg  LVp EDP Pressure 20 mmHg  QP/QS 1  TPVR Index 8.15 HRUI  TSVR Index 23.9 HRUI  PVR SVR Ratio 0.12  TPVR/TSVR Ratio 0.34    ADDENDUM REPORT: 04/17/2019 18:48  CLINICAL DATA:  73 year old female with severe aortic stenosis being evaluated for a TAVR procedure.  EXAM: Cardiac TAVR CT  TECHNIQUE: The patient was scanned on a [employer]. A 120 kV retrospective scan was triggered in the descending thoracic aorta at 111 HU's. Gantry rotation speed was 250 msecs and collimation was .6 mm. No beta blockade or nitro were given. The 3D data set was reconstructed in 5% intervals of the R-R cycle. Systolic and diastolic phases were analyzed on a dedicated work station using MPR, MIP and VRT modes. The patient received 80 cc of contrast.  FINDINGS: Aortic Valve: Trileaflet aortic valve with  severely calcified and thickened leaflets and mild asymmetric calcifications extending into LVOT under the non-coronary cusp.  Aorta: Normal size with mild diffuse calcifications and no dissection.  Sinotubular Junction: 26 x 24 mm  Ascending Thoracic Aorta: 29 x 29  mm  Aortic Arch: 24 x 22 mm  Descending Thoracic Aorta: 21 x 19 mm  Sinus of Valsalva Measurements:  Non-coronary: 30 mm  Right -coronary: 29 mm  Left -coronary: 30 mm  Coronary Artery Height above Annulus:  Left Main: 15 mm  Right Coronary: 14 mm  Virtual Basal Annulus Measurements:  Maximum/Minimum Diameter: 27.1 x 19.1 mm  Mean Diameter: 22.5 mm  Perimeter: 73 mm  Area: 397 mm2  Optimum Fluoroscopic Angle for Delivery: LAO 12 CAU 11  IMPRESSION: 1. Trileaflet aortic valve with severely calcified and thickened leaflets and mild asymmetric calcifications extending into LVOT under the non-coronary cusp. Annular measurements suitable for delivery of a 23 mm Edwards-SAPIEN 3 Ultra valve. Aortic valve calcium score is 1714 consistent with severe aortic stenosis.  2. Sufficient coronary to annulus distance.  3. Optimum Fluoroscopic Angle for Delivery:  LAO 12 CAU 11  4. No thrombus in the left atrial appendage.   Electronically Signed   By: Ena Dawley   On: 04/17/2019 18:48   Addended by Dorothy Spark, MD on 04/17/2019 6:50 PM    Study Result  EXAM: OVER-READ INTERPRETATION  CT CHEST  The following report is an over-read performed by radiologist Dr. Vinnie Langton of [facility], [place] on 04/17/2019. This over-read does not include interpretation of cardiac or coronary anatomy or pathology. The coronary calcium score/coronary CTA interpretation by the cardiologist is attached.  COMPARISON:  None.  FINDINGS: Extracardiac findings will be described separately under dictation for contemporaneously obtained CTA chest, abdomen and pelvis.  IMPRESSION: Please see separate dictation for contemporaneously obtained CTA chest, abdomen and pelvis dated 04/17/2019 for full description of relevant extracardiac findings.  Electronically Signed: By: Vinnie Langton M.D. On: 04/17/2019 10:36       CLINICAL  DATA:  73 year old female with history of severe aortic valve stenosis. Preprocedural study prior to potential transcatheter aortic valve replacement (TAVR) procedure.  EXAM: CT ANGIOGRAPHY CHEST, ABDOMEN AND PELVIS  TECHNIQUE: Multidetector CT imaging through the chest, abdomen and pelvis was performed using the standard protocol during bolus administration of intravenous contrast. Multiplanar reconstructed images and MIPs were obtained and reviewed to evaluate the vascular anatomy.  CONTRAST:  160m OMNIPAQUE IOHEXOL 350 MG/ML SOLN  COMPARISON:  CT the abdomen and pelvis 12/18/2018.  FINDINGS: CTA CHEST FINDINGS  Cardiovascular: Heart size is normal. There is no significant pericardial fluid, thickening or pericardial calcification. There is aortic atherosclerosis, as well as atherosclerosis of the great vessels of the mediastinum and the coronary arteries, including calcified atherosclerotic plaque in the left anterior descending coronary artery. Severe thickening calcification of the aortic valve. Severe calcifications of the mitral annulus.  Mediastinum/Lymph Nodes: No pathologically enlarged mediastinal or hilar lymph nodes. Esophagus is unremarkable in appearance. No axillary lymphadenopathy.  Lungs/Pleura: No suspicious appearing pulmonary nodules or masses are noted. No acute consolidative airspace disease. No pleural effusions.  Musculoskeletal/Soft Tissues: There are no aggressive appearing lytic or blastic lesions noted in the visualized portions of the skeleton.  CTA ABDOMEN AND PELVIS FINDINGS  Hepatobiliary: No suspicious cystic or solid hepatic lesions. No intra or extrahepatic biliary ductal dilatation. Gallbladder is normal in appearance.  Pancreas: No pancreatic mass. No pancreatic ductal dilatation. No pancreatic or peripancreatic fluid collections or inflammatory  changes.  Spleen: Unremarkable.  Adrenals/Urinary Tract: Bilateral  kidneys and bilateral adrenal glands are normal in appearance. No hydroureteronephrosis. Urinary bladder is normal in appearance.  Stomach/Bowel: Normal appearance of the stomach. No pathologic dilatation of small bowel or colon. The appendix is not confidently identified and may be surgically absent. Regardless, there are no inflammatory changes noted adjacent to the cecum to suggest the presence of an acute appendicitis at this time.  Vascular/Lymphatic: Mild aortic atherosclerosis, without evidence of aneurysm or dissection in the abdominal or pelvic vasculature. Vascular findings and measurements pertinent to potential TAVR procedure, as detailed below. No lymphadenopathy noted in the abdomen or pelvis.  Reproductive: Status post hysterectomy. Ovaries are not confidently identified may be surgically absent or atrophic.  Other: No significant volume of ascites.  No pneumoperitoneum.  Musculoskeletal: There are no aggressive appearing lytic or blastic lesions noted in the visualized portions of the skeleton.  VASCULAR MEASUREMENTS PERTINENT TO TAVR:  AORTA:  Minimal Aortic Diameter- 12 x 12 mm  Severity of Aortic Calcification- minimal  RIGHT PELVIS:  Right Common Iliac Artery -  Minimal Diameter-7.8 x 7.7 mm  Tortuosity-moderate  Calcification - none  Right External Iliac Artery -  Minimal Diameter-5.4 x 5.2 mm  Tortuosity-mild  Calcification - none  Right Common Femoral Artery -  Minimal Diameter-6.0 x 5.6 mm  Tortuosity-mild  Calcification - none  LEFT PELVIS:  Left Common Iliac Artery -  Minimal Diameter-9.3 x 8.8 mm  Tortuosity-moderate  Calcification - none  Left External Iliac Artery -  Minimal Diameter-6.7 x 6.6 mm  Tortuosity-mild  Calcification - none  Left Common Femoral Artery -  Minimal Diameter-7.1 x 7.0 mm  Tortuosity-mild  Calcification-none  Review of the MIP images confirms the  above findings.  IMPRESSION: 1. Vascular findings and measurements pertinent to potential TAVR procedure, as detailed above. 2. Severe thickening calcification of the aortic valve, compatible with the reported clinical history of severe aortic stenosis 3. Aortic atherosclerosis, in addition to left anterior descending coronary artery disease. Assessment for potential risk factor modification, dietary therapy or pharmacologic therapy may be warranted, if clinically indicated. 4. Additional incidental findings, as above.   Electronically Signed   By: Vinnie Langton M.D.   On: 04/17/2019 11:21   Impression:  This 73 year old woman has stage D, severe, symptomatic aortic stenosis with New York Heart Association class II symptoms of exertional fatigue and shortness of breath consistent with chronic diastolic congestive heart failure.  I have personally reviewed her 2D echocardiogram, cardiac catheterization, and CTA studies.  Her echo shows a trileaflet aortic valve with severe thickening and calcification with restricted mobility.  There is severe aortic stenosis with a mean gradient of 4 mmHg with a calculated aortic valve area 0.66 cm.  There is moderate aortic insufficiency.  Left ventricular systolic function is normal. Cardiac catheterization shows normal coronary arteries.  The mean aortic valve gradient is 50 mmHg consistent with severe aortic stenosis.  I agree that aortic valve replacement is indicated in this patient.  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  Her gated cardiac CTA shows anatomy suitable for transcatheter aortic valve placement using a Sapien 3 valve.  Her abdominal and pelvic CTA shows adequate pelvic vascular anatomy to allow transfemoral insertion.  The patient and her husband were counseled at length regarding  treatment alternatives for management of severe symptomatic aortic stenosis.  The risks and benefits of surgical intervention has been discussed in detail. Long-term prognosis with medical therapy was discussed. Alternative approaches such as conventional surgical aortic valve replacement, transcatheter aortic valve replacement, and palliative medical therapy were compared and contrasted at length. This discussion was placed in the context of the patient's own specific clinical presentation and past medical history. All of their questions have been addressed.    Following the decision to proceed with transcatheter aortic valve replacement, a discussion was held regarding what types of management strategies would be attempted intraoperatively in the event of life-threatening complications, including whether or not the patient would be considered a candidate for the use of cardiopulmonary bypass and/or conversion to open sternotomy for attempted surgical intervention.  I think she is a candidate for emergent sternotomy if needed to manage any intraoperative complications.  The patient has been advised of a variety of complications that might develop including but not limited to risks of death, stroke, paravalvular leak, aortic dissection or other major vascular complications, aortic annulus rupture, device embolization, cardiac rupture or perforation, mitral regurgitation, acute myocardial infarction, arrhythmia, heart block or bradycardia requiring permanent pacemaker placement, congestive heart failure, respiratory failure, renal failure, pneumonia, infection, other late complications related to structural valve deterioration or migration, or other complications that might ultimately cause a temporary or permanent loss of functional independence or other long term morbidity. The patient provides full informed consent for the procedure as described and all questions were answered.     Plan:  Transfemoral  transcatheter aortic valve replacement on 04/28/2019  I spent 60 minutes performing this consultation and > 50% of this time was spent face to face counseling and coordinating the care of this patient's severe symptomatic aortic stenosis.   Gaye Pollack, MD 04/22/2019 10:54 AM

## 2019-04-23 NOTE — Progress Notes (Signed)
[place], [address] [address] Phone: [phone] Fax: [phone]      Your procedure is scheduled on Tuesday, Oct. 20th.  Report to [facility] Main Entrance "A" at 7:25 A.M., and check in at the Admitting office.  Call this number if you have problems the morning of surgery:  [phone]  Call [phone] if you have any questions prior to your surgery date Monday-Friday 8am-4pm    Remember:  Do not eat or drink after midnight the night before your surgery     Take these medicines the morning of surgery with A SIP OF WATER  - NONE  Follow your surgeon's instructions on when to stop Aspirin.  If no instructions were given by your surgeon then you will need to call the office to get those instructions.    7 days prior to surgery STOP taking any Aleve, Naproxen, Ibuprofen, Motrin, Advil, Goody's, BC's, all herbal medications, fish oil, and all vitamins.   WHAT DO I DO ABOUT MY DIABETES MEDICATION?   Marland Kitchen Do not take oral diabetes medicines (pills) the morning of surgery. - Metformin   How to Manage Your Diabetes Before and After Surgery  Why is it important to control my blood sugar before and after surgery? . Improving blood sugar levels before and after surgery helps healing and can limit problems. . A way of improving blood sugar control is eating a healthy diet by: o  Eating less sugar and carbohydrates o  Increasing activity/exercise o  Talking with your doctor about reaching your blood sugar goals . High blood sugars (greater than 180 mg/dL) can raise your risk of infections and slow your recovery, so you will need to focus on controlling your diabetes during the weeks before surgery. . Make sure that the doctor who takes care of your diabetes knows about your planned surgery including the date and location.  How do I manage my blood sugar before surgery? . Check your blood sugar at least 4  times a day, starting 2 days before surgery, to make sure that the level is not too high or low. o Check your blood sugar the morning of your surgery when you wake up and every 2 hours until you get to the Short Stay unit. . If your blood sugar is less than 70 mg/dL, you will need to treat for low blood sugar: o Do not take insulin. o Treat a low blood sugar (less than 70 mg/dL) with  cup of clear juice (cranberry or apple), 4 glucose tablets, OR glucose gel. o Recheck blood sugar in 15 minutes after treatment (to make sure it is greater than 70 mg/dL). If your blood sugar is not greater than 70 mg/dL on recheck, call [phone] for further instructions. . Report your blood sugar to the short stay nurse when you get to Short Stay.  . If you are admitted to the hospital after surgery: o Your blood sugar will be checked by the staff and you will probably be given insulin after surgery (instead of oral diabetes medicines) to make sure you have good blood sugar levels. o The goal for blood sugar control after surgery is 80-180 mg/dL.     The Morning of Surgery  Do not wear jewelry, make-up or nail polish.  Do not wear lotions, powders, or perfumes/colognes, or deodorant  Do not shave 48 hours prior to surgery.  Men may shave face and neck.  Do not bring valuables to the hospital.  [facility] is not responsible for any belongings or valuables.  If you are a smoker, DO NOT Smoke 24 hours prior to surgery IF you wear a CPAP at night please bring your mask, tubing, and machine the morning of surgery   Remember that you must have someone to transport you home after your surgery, and remain with you for 24 hours if you are discharged the same day.   Contacts, glasses, hearing aids, dentures or bridgework may not be worn into surgery.    Leave your suitcase in the car.  After surgery it may be brought to your room.  For patients admitted to the hospital, discharge time will be determined by  your treatment team.  Patients discharged the day of surgery will not be allowed to drive home.    Special instructions:   [facility]- Preparing For Surgery  Before surgery, you can play an important role. Because skin is not sterile, your skin needs to be as free of germs as possible. You can reduce the number of germs on your skin by washing with CHG (chlorahexidine gluconate) Soap before surgery.  CHG is an antiseptic cleaner which kills germs and bonds with the skin to continue killing germs even after washing.    Oral Hygiene is also important to reduce your risk of infection.  Remember - BRUSH YOUR TEETH THE MORNING OF SURGERY WITH YOUR REGULAR TOOTHPASTE  Please do not use if you have an allergy to CHG or antibacterial soaps. If your skin becomes reddened/irritated stop using the CHG.  Do not shave (including legs and underarms) for at least 48 hours prior to first CHG shower. It is OK to shave your face.  Please follow these instructions carefully.   1. Shower the NIGHT BEFORE SURGERY and the MORNING OF SURGERY with CHG Soap.   2. If you chose to wash your hair, wash your hair first as usual with your normal shampoo.  3. After you shampoo, rinse your hair and body thoroughly to remove the shampoo.  4. Use CHG as you would any other liquid soap. You can apply CHG directly to the skin and wash gently with a scrungie or a clean washcloth.   5. Apply the CHG Soap to your body ONLY FROM THE NECK DOWN.  Do not use on open wounds or open sores. Avoid contact with your eyes, ears, mouth and genitals (private parts). Wash Face and genitals (private parts)  with your normal soap.   6. Wash thoroughly, paying special attention to the area where your surgery will be performed.  7. Thoroughly rinse your body with warm water from the neck down.  8. DO NOT shower/wash with your normal soap after using and rinsing off the CHG Soap.  9. Pat yourself dry with a CLEAN TOWEL.  10. Wear CLEAN  PAJAMAS to bed the night before surgery, wear comfortable clothes the morning of surgery  11. Place CLEAN SHEETS on your bed the night of your first shower and DO NOT SLEEP WITH PETS.    Day of Surgery:  Do not apply any deodorants/lotions. Please shower the morning of surgery with the CHG soap  Please wear clean clothes to the hospital/surgery center.   Remember to brush your teeth WITH YOUR REGULAR TOOTHPASTE.   Please read over the following fact sheets that you were given.

## 2019-04-24 ENCOUNTER — Encounter (HOSPITAL_COMMUNITY)
Admission: RE | Admit: 2019-04-24 | Discharge: 2019-04-24 | Disposition: A | Discharge status: Home / Self Care | Payer: PPO | Source: Ambulatory Visit | Attending: Cardiovascular Disease | Admitting: Cardiovascular Disease

## 2019-04-24 ENCOUNTER — Other Ambulatory Visit (HOSPITAL_COMMUNITY)
Admission: RE | Admit: 2019-04-24 | Discharge: 2019-04-24 | Disposition: A | Discharge status: Home / Self Care | Payer: PPO | Source: Ambulatory Visit | Attending: Cardiovascular Disease | Admitting: Cardiovascular Disease

## 2019-04-24 ENCOUNTER — Ambulatory Visit (HOSPITAL_COMMUNITY)
Admission: RE | Admit: 2019-04-24 | Discharge: 2019-04-24 | Disposition: A | Discharge status: Home / Self Care | Payer: PPO | Source: Ambulatory Visit | Attending: Cardiovascular Disease | Admitting: Cardiovascular Disease

## 2019-04-24 ENCOUNTER — Other Ambulatory Visit: Payer: Self-pay

## 2019-04-24 ENCOUNTER — Encounter (HOSPITAL_COMMUNITY): Payer: Self-pay

## 2019-04-24 DIAGNOSIS — Z01818 Encounter for other preprocedural examination: Secondary | ICD-10-CM | POA: Diagnosis not present

## 2019-04-24 DIAGNOSIS — Z20828 Contact with and (suspected) exposure to other viral communicable diseases: Secondary | ICD-10-CM | POA: Diagnosis not present

## 2019-04-24 DIAGNOSIS — R0600 Dyspnea, unspecified: Secondary | ICD-10-CM | POA: Insufficient documentation

## 2019-04-24 DIAGNOSIS — I350 Nonrheumatic aortic (valve) stenosis: Secondary | ICD-10-CM

## 2019-04-24 DIAGNOSIS — R9431 Abnormal electrocardiogram [ECG] [EKG]: Secondary | ICD-10-CM | POA: Diagnosis not present

## 2019-04-24 LAB — BLOOD GAS, ARTERIAL
Acid-Base Excess: 1.6 mmol/L (ref 0.0–2.0)
Bicarbonate: 25.4 mmol/L (ref 20.0–28.0)
Drawn by: 265211
FIO2: 0.21
O2 Saturation: 96.3 %
Patient temperature: 98.6
pCO2 arterial: 38.4 mmHg (ref 32.0–48.0)
pH, Arterial: 7.436 (ref 7.350–7.450)
pO2, Arterial: 81.8 mmHg — ABNORMAL LOW (ref 83.0–108.0)

## 2019-04-24 LAB — TYPE AND SCREEN
ABO/RH(D): A NEG
Antibody Screen: NEGATIVE

## 2019-04-24 LAB — COMPREHENSIVE METABOLIC PANEL
ALT: 15 U/L (ref 0–44)
AST: 18 U/L (ref 15–41)
Albumin: 4.2 g/dL (ref 3.5–5.0)
Alkaline Phosphatase: 48 U/L (ref 38–126)
Anion gap: 10 (ref 5–15)
BUN: 14 mg/dL (ref 8–23)
CO2: 22 mmol/L (ref 22–32)
Calcium: 9.5 mg/dL (ref 8.9–10.3)
Chloride: 106 mmol/L (ref 98–111)
Creatinine, Ser: 0.55 mg/dL (ref 0.44–1.00)
GFR calc Af Amer: >60 mL/min (ref >60)
GFR calc non Af Amer: >60 mL/min (ref >60)
Glucose, Bld: 98 mg/dL (ref 70–99)
Potassium: 3.9 mmol/L (ref 3.5–5.1)
Sodium: 138 mmol/L (ref 135–145)
Total Bilirubin: 0.7 mg/dL (ref 0.3–1.2)
Total Protein: 6.4 g/dL — ABNORMAL LOW (ref 6.5–8.1)

## 2019-04-24 LAB — CBC
HCT: 34.8 % — ABNORMAL LOW (ref 36.0–46.0)
Hemoglobin: 11.9 g/dL — ABNORMAL LOW (ref 12.0–15.0)
MCH: 31.6 pg (ref 26.0–34.0)
MCHC: 34.2 g/dL (ref 30.0–36.0)
MCV: 92.3 fL (ref 80.0–100.0)
Platelets: 194 10*3/uL (ref 150–400)
RBC: 3.77 MIL/uL — ABNORMAL LOW (ref 3.87–5.11)
RDW: 13.1 % (ref 11.5–15.5)
WBC: 4.3 10*3/uL (ref 4.0–10.5)
nRBC: 0 % (ref 0.0–0.2)

## 2019-04-24 LAB — SURGICAL PCR SCREEN
MRSA, PCR: NEGATIVE
Staphylococcus aureus: NEGATIVE

## 2019-04-24 LAB — APTT: aPTT: 30 seconds (ref 24–36)

## 2019-04-24 LAB — URINALYSIS, ROUTINE W REFLEX MICROSCOPIC
Bilirubin Urine: NEGATIVE
Glucose, UA: NEGATIVE mg/dL
Hgb urine dipstick: NEGATIVE
Ketones, ur: NEGATIVE mg/dL
Leukocytes,Ua: NEGATIVE
Nitrite: NEGATIVE
Protein, ur: NEGATIVE mg/dL
Specific Gravity, Urine: 1.008 (ref 1.005–1.030)
pH: 7 (ref 5.0–8.0)

## 2019-04-24 LAB — HEMOGLOBIN A1C
Hgb A1c MFr Bld: 5.1 % (ref 4.8–5.6)
Mean Plasma Glucose: 99.67 mg/dL

## 2019-04-24 LAB — PROTIME-INR
INR: 1 (ref 0.8–1.2)
Prothrombin Time: 13.6 seconds (ref 11.4–15.2)

## 2019-04-24 LAB — ABO/RH: ABO/RH(D): A NEG

## 2019-04-24 LAB — BRAIN NATRIURETIC PEPTIDE: B Natriuretic Peptide: 87.1 pg/mL (ref 0.0–100.0)

## 2019-04-24 NOTE — Progress Notes (Addendum)
[place], [place] [address] Phone: [phone] Fax: [phone]      Your procedure is scheduled on Tuesday, Oct. 20th.  Report to [facility] Main Entrance "A" at 7:00 A.M., and check in at the Admitting office.  Call this number if you have problems the morning of surgery:  [phone]  Call [phone] if you have any questions prior to your surgery date Monday-Friday 8am-4pm    Remember:  Do not eat or drink after midnight the night before your surgery     Take these medicines the morning of surgery with A SIP OF WATER  - NONE  Follow your surgeon's instructions on when to stop Aspirin.  If no instructions were given by your surgeon then you will need to call the office to get those instructions.    7 days prior to surgery STOP taking any Aleve, Naproxen, Ibuprofen, Motrin, Advil, Goody's, BC's, all herbal medications, fish oil, and all vitamins.   The Morning of Surgery  Do not wear jewelry, make-up or nail polish.  Do not wear lotions, powders, or perfumes/colognes, or deodorant  Do not shave 48 hours prior to surgery.  Men may shave face and neck.  Do not bring valuables to the hospital.  [facility] is not responsible for any belongings or valuables.  If you are a smoker, DO NOT Smoke 24 hours prior to surgery IF you wear a CPAP at night please bring your mask, tubing, and machine the morning of surgery   Remember that you must have someone to transport you home after your surgery, and remain with you for 24 hours if you are discharged the same day.   Contacts, glasses, hearing aids, dentures or bridgework may not be worn into surgery.    Leave your suitcase in the car.  After surgery it may be brought to your room.  For patients admitted to the hospital, discharge time will be determined by your treatment team.  Patients discharged the day of surgery will not be allowed to drive  home.    Special instructions:   [facility]- Preparing For Surgery  Before surgery, you can play an important role. Because skin is not sterile, your skin needs to be as free of germs as possible. You can reduce the number of germs on your skin by washing with CHG (chlorahexidine gluconate) Soap before surgery.  CHG is an antiseptic cleaner which kills germs and bonds with the skin to continue killing germs even after washing.    Oral Hygiene is also important to reduce your risk of infection.  Remember - BRUSH YOUR TEETH THE MORNING OF SURGERY WITH YOUR REGULAR TOOTHPASTE  Please do not use if you have an allergy to CHG or antibacterial soaps. If your skin becomes reddened/irritated stop using the CHG.  Do not shave (including legs and underarms) for at least 48 hours prior to first CHG shower. It is OK to shave your face.  Please follow these instructions carefully.   1. Shower the NIGHT BEFORE SURGERY and the MORNING OF SURGERY with CHG Soap.   2. If you chose to wash your hair, wash your hair first as usual with your normal shampoo.  3. After you shampoo, rinse your hair and body thoroughly to remove the shampoo.  4. Use CHG as you would any other liquid soap. You can apply CHG directly to the skin and wash gently with a scrungie or  a clean washcloth.   5. Apply the CHG Soap to your body ONLY FROM THE NECK DOWN.  Do not use on open wounds or open sores. Avoid contact with your eyes, ears, mouth and genitals (private parts). Wash Face and genitals (private parts)  with your normal soap.   6. Wash thoroughly, paying special attention to the area where your surgery will be performed.  7. Thoroughly rinse your body with warm water from the neck down.  8. DO NOT shower/wash with your normal soap after using and rinsing off the CHG Soap.  9. Pat yourself dry with a CLEAN TOWEL.  10. Wear CLEAN PAJAMAS to bed the night before surgery, wear comfortable clothes the morning of  surgery  11. Place CLEAN SHEETS on your bed the night of your first shower and DO NOT SLEEP WITH PETS.    Day of Surgery:  Do not apply any deodorants/lotions. Please shower the morning of surgery with the CHG soap  Please wear clean clothes to the hospital/surgery center.   Remember to brush your teeth WITH YOUR REGULAR TOOTHPASTE.   Please read over the following fact sheets that you were given.

## 2019-04-24 NOTE — Progress Notes (Signed)
PCP - Dr. Loura Pardon Cardiologist - Dr. Ellyn Hack   Chest x-ray - 04/24/19 EKG - 04/24/19 Stress Test - denies ECHO - 08/29/18 Cardiac Cath - 04/09/19  Sleep Study - denies    Aspirin Instructions: follow MD ASA instructions. Patient instructed to hold all NSAID's, herbal medications, fish oil and vitamins 7 days prior to surgery.   ERAS Protcol - PRE-SURGERY Ensure or G2-   COVID TEST- 04/24/19 at [facility]. Pt instructed to inform security they are having surgery and need to be in the right hand lane. Aware if they arrive at large "white tent", to redirect to the surgical line.   Anesthesia review: Heart history  Patient denies shortness of breath, fever, cough and chest pain at PAT appointment   All instructions explained to the patient, with a verbal understanding of the material. Patient agrees to go over the instructions while at home for a better understanding. Patient also instructed to self quarantine after being tested for COVID-19. The opportunity to ask questions was provided.

## 2019-04-25 LAB — NOVEL CORONAVIRUS, NAA (HOSP ORDER, SEND-OUT TO REF LAB; TAT 18-24 HRS): SARS-CoV-2, NAA: NOT DETECTED

## 2019-04-27 MED ORDER — DEXMEDETOMIDINE HCL IN NACL 400 MCG/100ML IV SOLN
0.1000 ug/kg/h | INTRAVENOUS | Status: AC
  Administered 2019-04-28: 1 ug/kg/h via INTRAVENOUS
  Filled 2019-04-27: qty 100

## 2019-04-27 MED ORDER — POTASSIUM CHLORIDE 2 MEQ/ML IV SOLN
80.0000 meq | INTRAVENOUS | Status: DC
  Filled 2019-04-27: qty 40

## 2019-04-27 MED ORDER — MAGNESIUM SULFATE 50 % IJ SOLN
40.0000 meq | INTRAMUSCULAR | Status: DC
  Filled 2019-04-27: qty 9.85

## 2019-04-27 MED ORDER — SODIUM CHLORIDE 0.9 % IV SOLN
INTRAVENOUS | Status: DC
  Filled 2019-04-27: qty 30

## 2019-04-27 MED ORDER — NOREPINEPHRINE 4 MG/250ML-% IV SOLN
0.0000 ug/min | INTRAVENOUS | Status: AC
  Administered 2019-04-28: 1 ug/min via INTRAVENOUS
  Filled 2019-04-27: qty 250

## 2019-04-27 MED ORDER — VANCOMYCIN HCL 10 G IV SOLR
1250.0000 mg | INTRAVENOUS | Status: AC
  Administered 2019-04-28: 1250 mg via INTRAVENOUS
  Filled 2019-04-27: qty 1250

## 2019-04-27 MED ORDER — LEVOFLOXACIN IN D5W 500 MG/100ML IV SOLN
500.0000 mg | INTRAVENOUS | Status: AC
  Administered 2019-04-28: 500 mg via INTRAVENOUS
  Filled 2019-04-27: qty 100

## 2019-04-27 NOTE — H&P (Signed)
[address]       [place] [address]             [phone]      Cardiothoracic Surgery Admission History and Physical   Referring Provider is Veronica Greene  Primary Cardiologist is Veronica Hew, Greene  PCP is [facility], Veronica Fanny, Greene      Chief Complaint  Patient presents with   Aortic Stenosis       HPI:  The patient is a 73 year old woman with history of hypertension, hyperlipidemia, fibromyalgia, post polio syndrome on right leg weakness and decreased length, and aortic stenosis first diagnosed around 2013 when she was noted to have a heart murmur. This has progressed over the past 18 months going from moderate with a 23 mm mean gradient in January 2019 to severe on the most current study with a gradient of 44 mmHg. There is also moderate aortic insufficiency. Ventricular systolic function is normal. She reports at least a 1 year history of progressive fatigue and tiredness. She said that she feels fairly well in the morning but by about 2:00 the afternoon is exhausted. She said that she may have some mild exertional dyspnea. She denies any chest pain or pressure. She has had mild lower extremity edema at times. She has had occasional episodes of dizziness.  She is married and lives with her husband. This is her second husband and they have been married a few years. She is retired and enjoys doing things around the house as well as traveling and camping with her husband.      Past Medical History:  Diagnosis Date   Acute sinusitis 09/13/2014   Allergic rhinitis    Anxiety and depression 08/26/2007   Qualifier: Diagnosis of By: Veronica Greene, Veronica Greene    Arthritis    knees   Back pain 02/16/2013   Constipation, slow transit 11/03/2010   CONTACT DERMATITIS 08/25/2010   Qualifier: Diagnosis of By: Veronica Greene, Veronica Greene    Contusion of breast, initial encounter 01/13/2019   Degenerative disk disease 08/31/2011   Depression with anxiety    DERMATITIS,  SEBORRHEIC 08/25/2010   Qualifier: Diagnosis of By: Veronica Greene, Veronica Greene    DIZZINESS OR VERTIGO 08/26/2007   Qualifier: Diagnosis of By: Veronica Greene, Veronica Greene    Dysuria 08/19/2009   Qualifier: Diagnosis of By: Veronica Greene, Veronica Greene    Ear itching 01/13/2019   EDEMA, CHRONIC 08/26/2007   Qualifier: Diagnosis of By: Veronica Greene, Veronica Greene    Elevated random blood glucose level 11/11/2017   168   Endometrial cancer ([place])    02/ 2019 Diagnosed with D&C/hysteroscopy with polypectomy   Essential hypertension 08/29/2017   Fibromyalgia    tx mobic   Full dentures    GERD 08/26/2007   Qualifier: Diagnosis of By: Veronica Greene, Veronica Greene    Gout    Hearing loss    wears bilateral hearing aids   History of colon polyps    History of nonmelanoma skin cancer    right lower leg   History of poliomyelitis 08/26/2007   Qualifier: History of By: Veronica Greene, Veronica Greene    Hyperlipidemia, mild 09/02/2012   Hypertension    Hypokalemia 01/02/2011   IBS (irritable bowel syndrome)    with constipation   Melanosis coli 11/03/2010   Obesity 08/26/2007   Qualifier: Diagnosis of By: Veronica Greene, Veronica Greene    OSTEOARTHRITIS, HANDS, BILATERAL 08/26/2007   Qualifier:  Diagnosis of By: Veronica Greene, Veronica Greene    Osteopenia    OVERACTIVE BLADDER 08/26/2007   Qualifier: Diagnosis of By: Veronica Greene, Veronica Greene    Personal history of colonic polyps 11/03/2010   05/2018 13 polyps mostly adenomas, ssp's - recall 1 year 2020 Veronica Greene, [facility]    Polio age 10 months   right leg weakness and 1.5 inches shorter than left leg.    Poor balance 11/25/2018   With falls Has rise in R shoe/post polio Weak legs as well    Severe aortic stenosis by prior echocardiogram 07/30/2017--- cardiologist-- dr Ellyn Hack   Previously diagnosed with murmur back in 2013. -- Most recent echocardiogram with from 02/17/2019 -> now demonstrates SEVERE AS -mean gradient 44 mmHg (up from 38 mmHg in February 2020)   Severe calcific aortic  stenosis 08/27/2017   Skin cancer 2019   Right leg   URINARY INCONTINENCE 08/26/2007   Qualifier: Diagnosis of By: Veronica Greene, Veronica Greene    Wears hearing aid in both ears         Past Surgical History:  Procedure Laterality Date   ANKLE SURGERY Right [place] N/A 06/24/2015   Procedure: COLONOSCOPY; Surgeon: Veronica Greene; Location: [facility]; Service: Endoscopy; Laterality: N/A;   DILATATION & CURETTAGE/HYSTEROSCOPY WITH MYOSURE N/A 08/09/2017   Procedure: DILATATION & CURETTAGE/HYSTEROSCOPY WITH MYOSURE; Surgeon: Veronica Greene; Location: [facility]; Service: Gynecology; Laterality: N/A; Polypectomy   DILATION AND CURETTAGE OF UTERUS     PMB   HAND SURGERY Bilateral 1999   Thumbs    KNEE SURGERY Bilateral 1998   x2   LYMPH NODE BIOPSY N/A 10/08/2017   Procedure: LYMPH NODE BIOPSY; Surgeon: Veronica Greene; Location: [facility]; Service: Gynecology; Laterality: N/A;   MULTIPLE TOOTH EXTRACTIONS     NASAL SINUS SURGERY  1976   polio surgery.     right foot - right leg 1.5" shorter than left leg - some wekness   RIGHT/LEFT HEART CATH AND CORONARY ANGIOGRAPHY N/A 04/09/2019   Procedure: RIGHT/LEFT HEART CATH AND CORONARY ANGIOGRAPHY; Surgeon: Veronica Greene; Location: [facility]; Service: Cardiovascular; Laterality: N/A;   ROBOTIC ASSISTED TOTAL HYSTERECTOMY WITH BILATERAL SALPINGO OOPHERECTOMY Bilateral 10/08/2017   Procedure: XI ROBOTIC ASSISTED TOTAL HYSTERECTOMY WITH BILATERAL SALPINGO OOPHORECTOMY; Surgeon: Veronica Greene; Location: [facility]; Service: Gynecology; Laterality: Bilateral;   THUMB ARTHROSCOPY  1999   TONSILLECTOMY  4135   73 years old   TRANSTHORACIC ECHOCARDIOGRAM  1/'19-2/'20   a) mild LVH, ef 60-65%, grade 2 DD, Mod AoV thickening /mild calcified w/ Mod AS/Mild AR (valve area 1.15cm^2, mean gradiant 15mHg, peak grandient 426mg)/ mild LAE and RAE/ trivial TR; b) 08/2018: Mod LVH.  Mod-Severe AS (mild AI) - mean gradient now 38 mmHg. Mild-Mod MAC. No MS. --notable progression of disease   TRANSTHORACIC ECHOCARDIOGRAM  02/17/2019   Mod LVH. GR 2 DD. Severe AS (mean gradient = 44 mmHg with Mod AI. mild MS.   TUBAL LIGATION     UPPER GI ENDOSCOPY     normal   WISDOM TOOTH EXTRACTION          Family History  Problem Relation Age of Onset   Kidney disease Mother    Alzheimer's disease Mother    Stroke Father [id number] Colon cancer Father [id number] possibly colon cancer, possibly just polyps   Kidney disease Sister    Heart attack Sister  Colon cancer Paternal Aunt 33   Cancer Paternal Uncle    unk type   Esophageal cancer Neg Hx    Stomach cancer Neg Hx    Social History        Socioeconomic History   Marital status: Married    Spouse name: Not on file   Number of children: 2   Years of education: Not on file   Highest education level: High school graduate  Occupational History   Occupation: RECEPTION    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; Technical brewer strain: Not on file   Food insecurity    Worry: Not on file    Inability: Not on file   Transportation needs    Medical: Not on file    Non-medical: Not on file  Tobacco Use   Smoking status: Never Smoker   Smokeless tobacco: Never Used  Substance and Sexual Activity   Alcohol use: No    Alcohol/week: 0.0 standard drinks   Drug use: No   Sexual activity: Yes    Birth control/protection: Post-menopausal  Lifestyle   Physical activity    Days per week: Not on file    Minutes per session: Not on file   Stress: Not on file  Relationships   Social connections    Talks on phone: Not on file    Gets together: Not on file    Attends religious service: Not on file    Active member of club or organization: Not on file    Attends meetings of clubs or organizations: Not on file    Relationship status: Not on file   Intimate  partner violence    Fear of current or ex partner: Not on file    Emotionally abused: Not on file    Physically abused: Not on file    Forced sexual activity: Not on file  Other Topics Concern   Not on file  Social History Narrative   She is relatively recently remarried. She has 2 children and 3 grandchildren.    She is currently retired. But enjoys cleaning house and doing chores. She likes to do yard work. Does not routinely exercise.         Current Outpatient Medications  Medication Sig Dispense Refill   acetaminophen (TYLENOL 8 HOUR ARTHRITIS PAIN) 650 MG CR tablet Take 650 mg by mouth daily as needed for pain.      acetaminophen (TYLENOL) 325 MG tablet Take 650 mg by mouth every 6 (six) hours as needed (for pain.).     acetic acid-hydrocortisone (ACETASOL HC) OTIC solution Place 2 drops into both ears 2 (two) times daily as needed (itching).     Ascorbic Acid (VITAMIN C) 1000 MG tablet Take 1,000 mg by mouth daily with breakfast.      aspirin EC 81 MG tablet Take 81 mg by mouth daily.     Black Pepper-Turmeric (TURMERIC COMPLEX/BLACK PEPPER PO) Take 15 mLs by mouth daily with breakfast. Liquid form      Calcium-Phosphorus-Vitamin D (CALCIUM/VITAMIN D3/ADULT GUMMY PO) Take 1 tablet by mouth 2 (two) times daily. Morning & afternoon     cetirizine (ZYRTEC) 10 MG tablet Take 5 mg by mouth as needed for allergies (alternates between mucinex & zyrtec for sinus symptoms).      cholecalciferol (VITAMIN D) 1000 UNITS tablet Take 1,000 Units by mouth daily with breakfast.      esomeprazole (NEXIUM) 20 MG capsule Take 40  mg by mouth daily before breakfast.     fluticasone (FLONASE) 50 MCG/ACT nasal spray Place 2 sprays into both nostrils at bedtime.      GUAIFENESIN 1200 PO Take 1,200 mg by mouth at bedtime. Mucinex     hydrochlorothiazide (HYDRODIURIL) 25 MG tablet Take 1 tablet (25 mg total) by mouth daily with lunch. 90 tablet 3   lubiprostone (AMITIZA) 24 MCG capsule Take 1  capsule (24 mcg total) by mouth 2 (two) times daily. LUNCH & SUPPER 180 capsule 3   meloxicam (MOBIC) 7.5 MG tablet TAKE 1 TABLET BY MOUTH AT BEDTIME. 90 tablet 3   metoprolol succinate (TOPROL XL) 25 MG 24 hr tablet Take 1 tablet (25 mg total) by mouth daily. 90 tablet 3   Multiple Vitamin (MULTIVITAMIN WITH MINERALS) TABS tablet Take 1 tablet by mouth daily. WOMEN'S MULTIVITAMIN     Polyethyl Glycol-Propyl Glycol (SYSTANE) 0.4-0.3 % SOLN Place 1-2 drops into both eyes 2 (two) times daily.      potassium chloride (K-DUR) 10 MEQ tablet Take 2 tablets (20 mEq total) by mouth 2 (two) times daily. 360 tablet 3   Simethicone (PHAZYME PO) Take 1 tablet by mouth at bedtime as needed (for gas (typically one tablet at night)).      sodium chloride (AYR) 0.65 % nasal spray Place 1 spray into the nose daily.      venlafaxine XR (EFFEXOR XR) 75 MG 24 hr capsule Take 1 capsule (75 mg total) by mouth daily after breakfast. 90 capsule 3   No current facility-administered medications for this visit.         Allergies  Allergen Reactions   Clarithromycin Swelling    throat swells   Codeine Nausea And Vomiting   Paroxetine Other (See Comments)    Ineffective.   Penicillins Hives    welts  Has patient had a PCN reaction causing immediate rash, facial/tongue/throat swelling, SOB or lightheadedness with hypotension: No  Has patient had a PCN reaction causing severe rash involving mucus membranes or skin necrosis: Unknown  Has patient had a PCN reaction that required hospitalization:No  Has patient had a PCN reaction occurring within the last 10 years: No  If all of the above answers are "NO", then may proceed with Cephalosporin use.    Review of Systems:   General: normal appetite, + decreased energy, no weight gain, + weight loss, no fever  Cardiac: no chest pain with exertion, no chest pain at rest, +SOB with moderate exertion, no resting SOB, no PND, no orthopnea, no palpitations, no  arrhythmia, no atrial fibrillation, + LE edema, + dizzy spells, no syncope  Respiratory: + shortness of breath, no home oxygen, no productive cough, no dry cough, no bronchitis, no wheezing, no hemoptysis, no asthma, no pain with inspiration or cough, no sleep apnea, no CPAP at night  GI: + difficulty swallowing, no reflux, nop frequent heartburn, no hiatal hernia, no abdominal pain, no constipation, no diarrhea, no hematochezia, no hematemesis, no melena  GU: + dysuria, no frequency, + urinary tract infection, no hematuria, no kidney stones, no kidney disease  Vascular: no pain suggestive of claudication, no pain in feet, no leg cramps, no varicose veins, no DVT, no non-healing foot ulcer  Neuro: no stroke, no TIA's, no seizures, no headaches, no temporary blindness one eye, no slurred speech, no peripheral neuropathy, no chronic pain, no instability of gait, no memory/cognitive dysfunction  Musculoskeletal: + arthritis, no joint swelling, + myalgias, + difficulty walking, + reduced mobility  Skin: no rash, no itching, no skin infections, no pressure sores or ulcerations  Psych: no anxiety, no depression, no nervousness, no unusual recent stress  Eyes: no blurry vision, + floaters, no recent vision changes, + wears glasses or contacts  ENT: + hearing loss, no loose or painful teeth, + dentures, Hematologic: + easy bruising, no abnormal bleeding, no clotting disorder, no frequent epistaxis  Endocrine: no diabetes, does not check CBG's at home    Physical Exam:   BP (!) 148/77 (BP Location: Left Arm, Patient Position: Sitting, Cuff Size: Normal)   Temp (!) 97.5 F (36.4 C)   Resp 16   Ht _0  (1.651 m)   Wt 179 lb (81.2 kg)   SpO2 96% Comment: RA   BMI 29.79 kg/m  General: well-appearing  HEENT: Unremarkable, NCAT, PERLA, EOMI  Neck: no JVD, no bruits, no adenopathy or thyromegaly  Chest: clear to auscultation, symmetrical breath sounds, no wheezes, no rhonchi  CV: RRR, grade III/VI  crescendo/decrescendo murmur heard best at RSB, no diastolic murmur  Abdomen: soft, non-tender, no masses or organomegaly  Extremities: warm, well-perfused, pulses palpable in feet, no LE edema  Rectal/GU Deferred  Neuro: Grossly non-focal and symmetrical throughout  Skin: Clean and dry, no rashes, no breakdown    Diagnostic Tests:   ECHOCARDIOGRAM REPORT  Patient Name: Veronica Greene [facility] Date of Exam: 02/17/2019  Medical Rec #: [id number] Height: 67.0 in  Accession #: [id number] Weight: 181.4 lb  Date of Birth: 1945-12-07 BSA: 1.94 m  Patient Age: 27 years BP: 140/68 mmHg  Patient Gender: F HR: 69 bpm.  Exam Location: [address]  Procedure: 2D Echo, 3D Echo, Cardiac Doppler, Color Doppler and Strain Analysis  Indications: I35 Aortic stenosis.  History: Patient has prior history of Echocardiogram examinations, most  recent 08/29/2018. Aortic Valve Disease Risk Factors:  Hypertension, Dyslipidemia and Edema, Obesity.  Sonographer: Jessee Avers, RDCS  Referring Phys: [place]  1. The left ventricle has normal systolic function with an ejection fraction of 60-65%. The cavity size was normal. Left ventricular diastolic Doppler parameters are consistent with pseudonormalization. Elevated left ventricular end-diastolic pressure.  2. The right ventricle has normal systolic function. The cavity was normal. There is no increase in right ventricular wall thickness. Right ventricular systolic pressure is mildly elevated with an estimated pressure of 35.0 mmHg.  3. The average left ventricular global longitudinal strain is -19.0 %.  4. Left atrial size was mildly dilated.  5. The mitral valve is degenerative in appearance. Moderate thickening of the mitral valve leaflet. Mild calcification of the mitral valve leaflet. There is moderate mitral annular calcification present. Mitral valve regurgitation is mild by color flow  Doppler. Mild mitral valve stenosis. MV Mean  grad: 5.0 mmHg. MV Area (PHT): 2.22 cm.  6. The aorta is normal in size and structure.  7. The aortic valve is tricuspid Severely thickening of the aortic valve. Severe calcifcation of the aortic valve. Aortic valve regurgitation is moderate by color flow Doppler. There is Severe stenosis of the aortic valve, with a calculated valve area  of 0.66 cm. Severe aortic annular calcification noted. AV Mean Grad: 44.0 mmHg. LVOT/AV VTI ratio: 0.21  8. Compared to prior echo the aortic stenosis has progressed (mean AVG 38>>45mHg and dimensionless index 0.31>>0.21). The AI is now moderate.  FINDINGS  Left Ventricle: The left ventricle has normal systolic function, with an ejection fraction of 60-65%. The cavity size was normal. There is no increase  in left ventricular wall thickness. Left ventricular diastolic Doppler parameters are consistent with  pseudonormalization. Elevated left ventricular end-diastolic pressure The average left ventricular global longitudinal strain is -19.0 %.  Right Ventricle: The right ventricle has normal systolic function. The cavity was normal. There is no increase in right ventricular wall thickness. Right ventricular systolic pressure is mildly elevated with an estimated pressure of 35.0 mmHg.  Left Atrium: Left atrial size was mildly dilated.  Right Atrium: Right atrial size was normal in size. Right atrial pressure is estimated at 8 mmHg.  Interatrial Septum: No atrial level shunt detected by color flow Doppler.  Pericardium: There is no evidence of pericardial effusion.  Mitral Valve: The mitral valve is degenerative in appearance. Moderate thickening of the mitral valve leaflet. Mild calcification of the mitral valve leaflet. There is moderate mitral annular calcification present. Mitral valve regurgitation is mild by  color flow Doppler. Mild mitral valve stenosis.  Tricuspid Valve: The tricuspid valve is normal in structure. Tricuspid valve regurgitation is mild by  color flow Doppler.  Aortic Valve: The aortic valve is tricuspid Severely thickening of the aortic valve. Severe calcifcation of the aortic valve. Aortic valve regurgitation is moderate by color flow Doppler. There is Severe stenosis of the aortic valve, with a calculated  valve area of 0.66 cm. Severe aortic annular calcification noted.  Pulmonic Valve: The pulmonic valve was normal in structure. Pulmonic valve regurgitation is trivial by color flow Doppler.  Aorta: The aorta is normal in size and structure.  Venous: The inferior vena cava measures 1.78 cm, is normal in size with greater than 50% respiratory variability.  Compared to previous exam: 08/29/18 EF 65%. PA pressure 30mHg. Moderate-severe AS 363mg mean, 6771m peak.  +--------------+--------++   LEFT VENTRICLE     +----------------+---------++  +--------------+--------++  Diastology        PLAX 2D      +----------------+---------++  +--------------+--------++  LV e' lateral:  5.85 cm/s     LVIDd:  4.80 cm    +----------------+---------++  +--------------+--------++  LV E/e' lateral: 24.3      LVIDs:  3.45 cm    +----------------+---------++  +--------------+--------++  LV e' medial:  4.95 cm/s     LV PW:  1.00 cm    +----------------+---------++  +--------------+--------++  LV E/e' medial:  28.7      LV IVS:  0.90 cm    +----------------+---------++  +--------------+--------++   LVOT diam:  2.00 cm    +----------------------+-------++  +--------------+--------++  2D Longitudinal Strain       LV SV:  58 ml    +----------------------+-------++  +--------------+--------++  2D Strain GLS (A2C):  -16.6 %     LV SV Index:  29.30    +----------------------+-------++  +--------------+--------++  2D Strain GLS (A3C):  -19.8 %     LVOT Area:  3.14 cm   +----------------------+-------++  +--------------+--------++  2D Strain GLS (A4C):  -20.7 %           +----------------------+-------++  +--------------+--------++  2D Strain GLS  Avg:  -19.0 %    +----------------------+-------++  +-------------+------++   3D Volume EF:      +-------------+------++   3D EF:  59 %     +-------------+------++   LV EDV:  118 ml    +-------------+------++   LV ESV:  48 ml     +-------------+------++   LV SV:  70 ml     +-------------+------++  +---------------+---------++   RIGHT VENTRICLE      +---------------+---------++  RV S prime:  9.60 cm/s    +---------------+---------++   TAPSE (M-mode): 1.9 cm     +---------------+---------++   RVSP:  35.0 mmHg    +---------------+---------++  +---------------+-------++-----------++   LEFT ATRIUM     Index     +---------------+-------++-----------++   LA diam:  4.40 cm  2.27 cm/m     +---------------+-------++-----------++   LA Vol (A2C):  52.3 ml  26.96 ml/m    +---------------+-------++-----------++   LA Vol (A4C):  66.1 ml  34.07 ml/m    +---------------+-------++-----------++   LA Biplane Vol: 58.8 ml  30.31 ml/m    +---------------+-------++-----------++  +------------+---------++-----------++   RIGHT ATRIUM    Index     +------------+---------++-----------++   RA Pressure: 8.00 mmHg       +------------+---------++-----------++   RA Area:  10.20 cm       +------------+---------++-----------++   RA Volume:  19.60 ml   10.10 ml/m    +------------+---------++-----------++  +------------------+------------++   AORTIC VALVE       +------------------+------------++   AV Area (Vmax):  0.82 cm     +------------------+------------++   AV Area (Vmean):  0.68 cm     +------------------+------------++   AV Area (VTI):  0.66 cm     +------------------+------------++   AV Vmax:  431.00 cm/s     +------------------+------------++   AV Vmean:  299.800 cm/s    +------------------+------------++   AV VTI:  1.122 m     +------------------+------------++   AV Peak Grad:  74.3 mmHg     +------------------+------------++   AV Mean Grad:  44.0 mmHg       +------------------+------------++   LVOT Vmax:  112.00 cm/s     +------------------+------------++   LVOT Vmean:  65.000 cm/s     +------------------+------------++   LVOT VTI:  0.235 m     +------------------+------------++   LVOT/AV VTI ratio: 0.21     +------------------+------------++   AR PHT:  430 msec     +------------------+------------++  +-------------+-------++   AORTA       +-------------+-------++   Ao Root diam: 3.00 cm    +-------------+-------++   Ao Asc diam:  3.30 cm    +-------------+-------++  +--------------+----------++ +---------------+-----------++   MITRAL VALVE       TRICUSPID VALVE      +--------------+----------++ +---------------+-----------++   MV Area (PHT): 2.22 cm     TR Peak grad:  27.0 mmHg     +--------------+----------++ +---------------+-----------++   MV Peak grad:  9.2 mmHg     TR Vmax:  281.00 cm/s    +--------------+----------++ +---------------+-----------++   MV Mean grad:  5.0 mmHg     Estimated RAP:  8.00 mmHg     +--------------+----------++ +---------------+-----------++   MV Vmax:  1.52 m/s     RVSP:  35.0 mmHg     +--------------+----------++ +---------------+-----------++   MV Vmean:  101.0 cm/s    +--------------+----------++ +--------------+-------+   MV VTI:  0.59 m     SHUNTS      +--------------+----------++ +--------------+-------+   MV PHT:  99.18 msec    Systemic VTI:  0.24 m    +--------------+----------++ +--------------+-------+   MV Decel Time: 342 msec     Systemic Diam: 2.00 cm   +--------------+----------++ +--------------+-------+  +--------------+-----------++   MV E velocity: 142.00 cm/s    +--------------+-----------++   MV A velocity: 129.00 cm/s    +--------------+-----------++   MV E/A ratio:  1.10     +--------------+-----------++  +---------+-------+   IVC      +---------+-------+  IVC diam: 1.78 cm   +---------+-------+  Fransico Him Greene  Electronically signed by Fransico Him Greene   Signature Date/Time: 02/17/2019/12:31:56 PM      Panel Physicians Referring Physician Case Authorizing Physician  Veronica Greene (Primary)    Procedures  RIGHT/LEFT HEART CATH AND CORONARY ANGIOGRAPHY  Conclusion  There is severe aortic valve stenosis.  Hemodynamic findings consistent with mild pulmonary hypertension. LV end diastolic pressure is moderately elevated.  Angiographically normal coronary arteries, very tortuous SUMMARY  Severe calcific aortic stenosis -mean aortic gradient 50 mmHg with peak to peak 55 mmHg. (Estimated valve area 0.76 cm)  Mild pulmonary hypertension secondary to elevated LVEDP and PCWP  Angiographically normal coronary arteries RECOMMENDATIONS  Standard post cath care and discharge home after bed rest  She will be met by the structural heart/valve clinic team coordinator Nell Range, [place]) prior to discharge today. Will be scheduled in the valve clinic.  Follow-up with me as scheduled.  No change in medications. Veronica Hew, Greene   Recommendations  Antiplatelet/Anticoag Recommend Aspirin 77m daily for moderate CAD.  Indications  Severe aortic stenosis by prior echocardiogram [I35.0 (ICD-10-CM)]  Procedural Details  Technical Details PCP: TAbner GreenspanMD Cardiologist: DGlenetta Hew Greene  AKnox RoyaltyKGillinis a very pleasant 73year old woman with hypertension and hyperlipidemia who has been followed for aortic stenosis and has had significant progression of aortic stenosis from moderate as of January 2019 mild to moderate-severe February 2020 and then severe with a mean gradient of 45 mmHg in August 2020. She has had some decreased exercise tolerance and exertional dyspnea. With Naus significant rapid progression of valve stenosis, I discussed with the structural heart team who recommended proceeding with evaluation for valve replacement. She now presents for right and left heart catheterization.  Time Out: Verified patient identification,  verified procedure, site/side was marked, verified correct patient position, special equipment/implants available, medications/allergies/relevent history reviewed, required imaging and test results available. Performed.  Access:  RIGHT Radial Artery: 6 Fr sheath -- Seldinger technique using Micropuncture Kit -- Direct ultrasound guidance used. Permanent image obtained and placed on chart. -- 10 mL radial cocktail IA; 4000 Units IV Heparin  * Right Brachial/Antecubital Vein: 5 Fr sheath -- Seldinger technique using Micropuncture Kit -- Direct ultrasound guidance used. Permanent image obtained and placed on chart.  Right Heart Catheterization: 5 Fr SGordy Councilmancatheter advanced under fluoroscopy with balloon inflated to the RA, RV, then PCWP-PA for hemodynamic measurement.  * Simultaneous FA & PA blood gases checked for SaO2% to calculate FICK CO/CI  * Catheter removed completely out of the body with balloon deflated.  Left Heart Catheterization: 5Fr Catheters advanced or exchanged over a J-wire under direct fluoroscopic guidance into the ascending aorta; TIG 4.0 catheter advanced first.   * Left& Right Coronary Artery Cineangiography: TIG 4.0 Catheter  * * LV Hemodynamics (no LV Gram): AL-1 Catheter  Upon completion of Angiogaphy, the catheter was removed completely out of the body over a wire, without complication.  Brachial Sheath(s) removed in the Cath Lab with manual pressure for hemostasis.   Radial sheath removed in the Cardiac Catheterization lab with TR Band placed for hemostasis.  TR Band: 1010 Hours; 15 mL air  MEDICATIONS * SQ Lidocaine 341m* Radial Cocktail: 3 mg Verapmil in 10 mL NS * Isovue Contrast: 50 mL * Heparin: 4000 Units  Fluoro time: 4 minutes. Dose Area Product: [id number]. Cumulative Air Kerma: 184 mGy.   Estimated blood loss <50 mL.  During this procedure medications were administered to achieve and maintain moderate conscious sedation while the patient's  heart rate, blood pressure, and oxygen saturation were continuously monitored and I was present face-to-face 100% of this time.  Medications  (Filter: Administrations occurring from 04/09/19 0857 to 04/09/19 1008)          Medication Rate/Dose/Volume Action  Date Time   midazolam (VERSED) injection (mg) 2 mg Given 04/09/19 0908   Total dose as of 04/09/19 1008 1 mg Given 0943   3 mg        fentaNYL (SUBLIMAZE) injection (mcg) 25 mcg Given 04/09/19 0908   Total dose as of 04/09/19 1008 25 mcg Given 0944   50 mcg        lidocaine (PF) (XYLOCAINE) 1 % injection (mL) 2 mL Given 04/09/19 0931   Total dose as of 04/09/19 1008 2 mL Given 0937   6 mL 2 mL Given 0940   Radial Cocktail/Verapamil only (mL) 10 mL Given 04/09/19 0942   Total dose as of 04/09/19 1008        10 mL        heparin injection (Units) 4,000 Units Given 04/09/19 0949   Total dose as of 04/09/19 1008        4,000 Units        iohexol (OMNIPAQUE) 350 MG/ML injection (mL) 50 mL Given 04/09/19 1004   Total dose as of 04/09/19 1008        50 mL        Heparin (Porcine) in NaCl 1000-0.9 UT/500ML-% SOLN (mL) 500 mL Given 04/09/19 1005   Total dose as of 04/09/19 1008 500 mL Given 1005   1,000 mL        Sedation Time  Sedation Time Physician-1: 52 minutes 12 seconds  Contrast  Medication Name Total Dose  iohexol (OMNIPAQUE) 350 MG/ML injection 50 mL  Radiation/Fluoro  Fluoro time: 4 (min)  DAP: 9848 (mGycm2)  Cumulative Air Kerma: 557 (mGy)  Complications  Complications documented before study signed (04/09/2019 32:20 AM)   No complications were associated with this study.  Documented by Veronica Greene - 04/09/2019 10:27 AM  Coronary Findings  Diagnostic  Dominance: Right  Left Main  Vessel was injected. Vessel is large. Vessel is angiographically normal.  Left Anterior Descending  Vessel was injected. Vessel is large. Vessel is angiographically normal. The vessel is tortuous. The vessel tapers down to a small  caliber vessel after 3rd Diag, and barely reaches the apex  First Diagonal Branch  Vessel was injected. Vessel is small in size. Vessel is angiographically normal.  Second Diagonal Branch  Vessel was injected. Vessel is normal in size. Vessel is moderate to large in size tapering to small caliber vessel Vessel is angiographically normal.  Third Diagonal Branch  Vessel was injected. Vessel is small in size. Vessel is angiographically normal.  Left Circumflex  Vessel was injected. Vessel is moderate in size. Vessel is angiographically normal.  First Obtuse Marginal Branch  Vessel was injected. Vessel is small in size. Vessel is angiographically normal.  Left Posterior Atrioventricular Artery  Vessel was injected. Vessel is small in size. Vessel is angiographically normal.  Right Coronary Artery  Vessel was injected. Vessel is large. Vessel is angiographically normal.  Acute Marginal Branch  Vessel was injected. Vessel is small in size. Vessel is angiographically normal.  Right Ventricular Branch  Vessel was injected. Vessel is small in size. Vessel is angiographically normal.  Right Posterior Descending Artery  Vessel was injected. Vessel is large in size. Vessel is angiographically normal. The vessel is ectatic.  Right Posterior Atrioventricular Artery  Vessel was injected. Vessel is large in size. Vessel is angiographically normal. The vessel is tortuous.  First Right Posterolateral Branch  Vessel was injected. Vessel is small in size. Vessel is angiographically normal.  Third Right Posterolateral Branch  Vessel was injected. Vessel is small in size. Vessel is angiographically normal.  Intervention  No interventions have been documented.  Right Heart  Right Heart Pressures Hemodynamic findings consistent with mild pulmonary hypertension. PA P 38/15 mmHg-mean 29 mmHg PCWP mean 20 mmHg, V wave 32 mmHg (large spiking V wave) Elevated LV EDP consistent with volume overload. LVEDP 20 mmHg  Ao P-MAP: 131/58 mmHg - 89 mmHg.  Ao sat 99%, PA sat 77%  Cardiac Output and Index (Fick) 6.67-3.56  Right Atrium The right atrial size is normal. Right atrial pressure is elevated. RA P-10 mmHg  Right Ventricle RVP-EDP 42/6 mmHg - 11 mmHg  Wall Motion  Resting    No LV gram    Left Heart  Left Ventricle LV end diastolic pressure is moderately elevated. LV P-EDP 191/12 mmHg - 20 mmHg.  Aortic Valve There is severe aortic valve stenosis. The aortic valve is calcified.  Coronary Diagrams  Diagnostic  Dominance: Right   Intervention  Implants     No implant documentation for this case.  Syngo Images  Link to Procedure Log   Show images for CARDIAC CATHETERIZATION Procedure Log  Images on Long Term Storage    Show images for [place], [facility]   Hemo Data   Most Recent Value  Fick Cardiac Output 6.67 L/min  Fick Cardiac Output Index 3.56 (L/min)/BSA  Aortic Mean Gradient 50.62 mmHg  Aortic Peak Gradient 55 mmHg  Aortic Valve Area 0.76  Aortic Value Area Index 0.41 cm2/BSA  RA A Wave 12 mmHg  RA V Wave 10 mmHg  RA Mean 9 mmHg  RV Systolic Pressure 42 mmHg  RV Diastolic Pressure 6 mmHg  RV EDP 11 mmHg  PA Systolic Pressure 38 mmHg  PA Diastolic Pressure 15 mmHg  PA Mean 29 mmHg  PW A Wave 21 mmHg  PW V Wave 32 mmHg  PW Mean 20 mmHg  AO Systolic Pressure 226 mmHg  AO Diastolic Pressure 58 mmHg  AO Mean 85 mmHg  LV Systolic Pressure 333 mmHg  LV Diastolic Pressure 9 mmHg  LV EDP 19 mmHg  AOp Systolic Pressure 545 mmHg  AOp Diastolic Pressure 58 mmHg  AOp Mean Pressure 89 mmHg  LVp Systolic Pressure 625 mmHg  LVp Diastolic Pressure 12 mmHg  LVp EDP Pressure 20 mmHg  QP/QS 1  TPVR Index 8.15 HRUI  TSVR Index 23.9 HRUI  PVR SVR Ratio 0.12  TPVR/TSVR Ratio 0.34     ADDENDUM REPORT: 04/17/2019 18:48  CLINICAL DATA: 73 year old female with severe aortic stenosis being  evaluated for a TAVR procedure.  EXAM:  Cardiac TAVR CT  TECHNIQUE:  The patient was  scanned on a [employer]. A 120 kV  retrospective scan was triggered in the descending thoracic aorta at  111 HU's. Gantry rotation speed was 250 msecs and collimation was .6  mm. No beta blockade or nitro were given. The 3D data set was  reconstructed in 5% intervals of the R-R cycle. Systolic and  diastolic phases were analyzed on a dedicated work station using  MPR, MIP and VRT modes. The patient received 80 cc of contrast.  FINDINGS:  Aortic Valve: Trileaflet aortic valve with severely calcified and  thickened leaflets and mild asymmetric calcifications extending into  LVOT under the non-coronary cusp.  Aorta: Normal size with mild diffuse calcifications and no  dissection.  Sinotubular Junction: 26 x 24 mm  Ascending Thoracic Aorta: 29 x 29 mm  Aortic Arch: 24 x 22 mm  Descending Thoracic Aorta: 21 x 19 mm  Sinus of Valsalva Measurements:  Non-coronary: 30 mm  Right -coronary: 29 mm  Left -coronary: 30 mm  Coronary Artery Height above Annulus:  Left Main: 15 mm  Right Coronary: 14 mm  Virtual Basal Annulus Measurements:  Maximum/Minimum Diameter: 27.1 x 19.1 mm  Mean Diameter: 22.5 mm  Perimeter: 73 mm  Area: 397 mm2  Optimum Fluoroscopic Angle for Delivery: LAO 12 CAU 11  IMPRESSION:  1. Trileaflet aortic valve with severely calcified and thickened  leaflets and mild asymmetric calcifications extending into LVOT  under the non-coronary cusp. Annular measurements suitable for  delivery of a 23 mm Edwards-SAPIEN 3 Ultra valve. Aortic valve  calcium score is 1714 consistent with severe aortic stenosis.  2. Sufficient coronary to annulus distance.  3. Optimum Fluoroscopic Angle for Delivery: LAO 12 CAU 11  4. No thrombus in the left atrial appendage.  Electronically Signed  By: Ena Dawley  On: 04/17/2019 18:48   Addended by Dorothy Spark, Greene on 04/17/2019 6:50 PM  Study Result   EXAM:  OVER-READ INTERPRETATION CT CHEST  The following report is an  over-read performed by radiologist Dr.  Vinnie Langton of [facility], [place] on 04/17/2019. This  over-read does not include interpretation of cardiac or coronary  anatomy or pathology. The coronary calcium score/coronary CTA  interpretation by the cardiologist is attached.  COMPARISON: None.  FINDINGS:  Extracardiac findings will be described separately under dictation  for contemporaneously obtained CTA chest, abdomen and pelvis.  IMPRESSION:  Please see separate dictation for contemporaneously obtained CTA  chest, abdomen and pelvis dated 04/17/2019 for full description of  relevant extracardiac findings.  Electronically Signed:  By: Vinnie Langton M.D.  On: 04/17/2019 10:36   CLINICAL DATA: 73 year old female with history of severe aortic  valve stenosis. Preprocedural study prior to potential transcatheter  aortic valve replacement (TAVR) procedure.  EXAM:  CT ANGIOGRAPHY CHEST, ABDOMEN AND PELVIS  TECHNIQUE:  Multidetector CT imaging through the chest, abdomen and pelvis was  performed using the standard protocol during bolus administration of  intravenous contrast. Multiplanar reconstructed images and MIPs were  obtained and reviewed to evaluate the vascular anatomy.  CONTRAST: 157m OMNIPAQUE IOHEXOL 350 MG/ML SOLN  COMPARISON: CT the abdomen and pelvis 12/18/2018.  FINDINGS:  CTA CHEST FINDINGS  Cardiovascular: Heart size is normal. There is no significant  pericardial fluid, thickening or pericardial calcification. There is  aortic atherosclerosis, as well as atherosclerosis of the great  vessels of the mediastinum and the coronary arteries, including  calcified atherosclerotic plaque in the left anterior descending  coronary artery. Severe thickening calcification of the aortic  valve. Severe calcifications of the mitral annulus.  Mediastinum/Lymph Nodes: No pathologically enlarged mediastinal or  hilar lymph nodes. Esophagus is unremarkable in appearance. No    axillary lymphadenopathy.  Lungs/Pleura: No suspicious appearing pulmonary nodules or masses  are noted. No acute consolidative airspace disease. No pleural  effusions.  Musculoskeletal/Soft Tissues: There are no aggressive appearing  lytic or blastic lesions noted in the visualized portions of the  skeleton.  CTA ABDOMEN AND PELVIS FINDINGS  Hepatobiliary: No suspicious cystic  or solid hepatic lesions. No  intra or extrahepatic biliary ductal dilatation. Gallbladder is  normal in appearance.  Pancreas: No pancreatic mass. No pancreatic ductal dilatation. No  pancreatic or peripancreatic fluid collections or inflammatory  changes.  Spleen: Unremarkable.  Adrenals/Urinary Tract: Bilateral kidneys and bilateral adrenal  glands are normal in appearance. No hydroureteronephrosis. Urinary  bladder is normal in appearance.  Stomach/Bowel: Normal appearance of the stomach. No pathologic  dilatation of small bowel or colon. The appendix is not confidently  identified and may be surgically absent. Regardless, there are no  inflammatory changes noted adjacent to the cecum to suggest the  presence of an acute appendicitis at this time.  Vascular/Lymphatic: Mild aortic atherosclerosis, without evidence of  aneurysm or dissection in the abdominal or pelvic vasculature.  Vascular findings and measurements pertinent to potential TAVR  procedure, as detailed below. No lymphadenopathy noted in the  abdomen or pelvis.  Reproductive: Status post hysterectomy. Ovaries are not confidently  identified may be surgically absent or atrophic.  Other: No significant volume of ascites. No pneumoperitoneum.  Musculoskeletal: There are no aggressive appearing lytic or blastic  lesions noted in the visualized portions of the skeleton.  VASCULAR MEASUREMENTS PERTINENT TO TAVR:  AORTA:  Minimal Aortic Diameter- 12 x 12 mm  Severity of Aortic Calcification- minimal  RIGHT PELVIS:  Right Common Iliac Artery -   Minimal Diameter-7.8 x 7.7 mm  Tortuosity-moderate  Calcification - none  Right External Iliac Artery -  Minimal Diameter-5.4 x 5.2 mm  Tortuosity-mild  Calcification - none  Right Common Femoral Artery -  Minimal Diameter-6.0 x 5.6 mm  Tortuosity-mild  Calcification - none  LEFT PELVIS:  Left Common Iliac Artery -  Minimal Diameter-9.3 x 8.8 mm  Tortuosity-moderate  Calcification - none  Left External Iliac Artery -  Minimal Diameter-6.7 x 6.6 mm  Tortuosity-mild  Calcification - none  Left Common Femoral Artery -  Minimal Diameter-7.1 x 7.0 mm  Tortuosity-mild  Calcification-none  Review of the MIP images confirms the above findings.  IMPRESSION:  1. Vascular findings and measurements pertinent to potential TAVR  procedure, as detailed above.  2. Severe thickening calcification of the aortic valve, compatible  with the reported clinical history of severe aortic stenosis  3. Aortic atherosclerosis, in addition to left anterior descending  coronary artery disease. Assessment for potential risk factor  modification, dietary therapy or pharmacologic therapy may be  warranted, if clinically indicated.  4. Additional incidental findings, as above.  Electronically Signed  By: Vinnie Langton M.D.  On: 04/17/2019 11:21   Impression:    This 73 year old woman has stage D, severe, symptomatic aortic stenosis with New York Heart Association class II symptoms of exertional fatigue and shortness of breath consistent with chronic diastolic congestive heart failure. I have personally reviewed her 2D echocardiogram, cardiac catheterization, and CTA studies. Her echo shows a trileaflet aortic valve with severe thickening and calcification with restricted mobility. There is severe aortic stenosis with a mean gradient of 4 mmHg with a calculated aortic valve area 0.66 cm. There is moderate aortic insufficiency. Left ventricular systolic function is normal. Cardiac catheterization shows  normal coronary arteries. The mean aortic valve gradient is 50 mmHg consistent with severe aortic stenosis. I agree that aortic valve replacement is indicated in this patient. 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 Her gated cardiac CTA shows anatomy suitable  for transcatheter aortic valve placement using a Sapien 3 valve. Her abdominal and pelvic CTA shows adequate pelvic vascular anatomy to allow transfemoral insertion.  The patient and her husband were counseled at length regarding treatment alternatives for management of severe symptomatic aortic stenosis. The risks and benefits of surgical intervention has been discussed in detail. Long-term prognosis with medical therapy was discussed. Alternative approaches such as conventional surgical aortic valve replacement, transcatheter aortic valve replacement, and palliative medical therapy were compared and contrasted at length. This discussion was placed in the context of the patient's own specific clinical presentation and past medical history. All of their questions have been addressed.  Following the decision to proceed with transcatheter aortic valve replacement, a discussion was held regarding what types of management strategies would be attempted intraoperatively in the event of life-threatening complications, including whether or not the patient would be considered a candidate for the use of cardiopulmonary bypass and/or conversion to open sternotomy for attempted surgical intervention. I think she is a candidate for emergent sternotomy if needed to manage any intraoperative complications. The patient has been advised of a variety of complications that might develop including but not limited to risks of death, stroke, paravalvular leak, aortic dissection or other major vascular complications, aortic annulus  rupture, device embolization, cardiac rupture or perforation, mitral regurgitation, acute myocardial infarction, arrhythmia, heart block or bradycardia requiring permanent pacemaker placement, congestive heart failure, respiratory failure, renal failure, pneumonia, infection, other late complications related to structural valve deterioration or migration, or other complications that might ultimately cause a temporary or permanent loss of functional independence or other long term morbidity. The patient provides full informed consent for the procedure as described and all questions were answered.   Plan:   Transfemoral transcatheter aortic valve replacement.   Gaye Pollack, Greene

## 2019-04-28 ENCOUNTER — Inpatient Hospital Stay (HOSPITAL_COMMUNITY): Payer: PPO | Admitting: Physician Assistant

## 2019-04-28 ENCOUNTER — Other Ambulatory Visit: Payer: Self-pay | Admitting: Physician Assistant

## 2019-04-28 ENCOUNTER — Inpatient Hospital Stay (HOSPITAL_COMMUNITY)
Admission: RE | Admit: 2019-04-28 | Discharge: 2019-04-29 | DRG: 267 | Disposition: A | Discharge status: Home / Self Care | Payer: PPO | Attending: Cardiovascular Disease | Admitting: Cardiovascular Disease

## 2019-04-28 ENCOUNTER — Encounter (HOSPITAL_COMMUNITY): Payer: Self-pay | Admitting: Certified Registered Nurse Anesthetist

## 2019-04-28 ENCOUNTER — Inpatient Hospital Stay (HOSPITAL_COMMUNITY): Payer: PPO

## 2019-04-28 ENCOUNTER — Encounter (HOSPITAL_COMMUNITY)
Admission: RE | Disposition: A | Discharge status: Home / Self Care | Payer: Self-pay | Source: Home / Self Care | Attending: Cardiovascular Disease

## 2019-04-28 ENCOUNTER — Inpatient Hospital Stay (HOSPITAL_COMMUNITY): Payer: PPO | Admitting: Vascular Surgery

## 2019-04-28 ENCOUNTER — Other Ambulatory Visit: Payer: Self-pay

## 2019-04-28 DIAGNOSIS — Z79899 Other long term (current) drug therapy: Secondary | ICD-10-CM

## 2019-04-28 DIAGNOSIS — Z90722 Acquired absence of ovaries, bilateral: Secondary | ICD-10-CM

## 2019-04-28 DIAGNOSIS — M8580 Other specified disorders of bone density and structure, unspecified site: Secondary | ICD-10-CM | POA: Diagnosis not present

## 2019-04-28 DIAGNOSIS — E669 Obesity, unspecified: Secondary | ICD-10-CM | POA: Diagnosis present

## 2019-04-28 DIAGNOSIS — Z9079 Acquired absence of other genital organ(s): Secondary | ICD-10-CM

## 2019-04-28 DIAGNOSIS — Z888 Allergy status to other drugs, medicaments and biological substances status: Secondary | ICD-10-CM

## 2019-04-28 DIAGNOSIS — K219 Gastro-esophageal reflux disease without esophagitis: Secondary | ICD-10-CM | POA: Diagnosis not present

## 2019-04-28 DIAGNOSIS — I10 Essential (primary) hypertension: Secondary | ICD-10-CM | POA: Diagnosis not present

## 2019-04-28 DIAGNOSIS — Z86010 Personal history of colonic polyps: Secondary | ICD-10-CM

## 2019-04-28 DIAGNOSIS — K581 Irritable bowel syndrome with constipation: Secondary | ICD-10-CM | POA: Diagnosis not present

## 2019-04-28 DIAGNOSIS — Z9181 History of falling: Secondary | ICD-10-CM

## 2019-04-28 DIAGNOSIS — Z7951 Long term (current) use of inhaled steroids: Secondary | ICD-10-CM

## 2019-04-28 DIAGNOSIS — J309 Allergic rhinitis, unspecified: Secondary | ICD-10-CM | POA: Diagnosis present

## 2019-04-28 DIAGNOSIS — Z8249 Family history of ischemic heart disease and other diseases of the circulatory system: Secondary | ICD-10-CM

## 2019-04-28 DIAGNOSIS — Z85828 Personal history of other malignant neoplasm of skin: Secondary | ICD-10-CM

## 2019-04-28 DIAGNOSIS — Z90710 Acquired absence of both cervix and uterus: Secondary | ICD-10-CM

## 2019-04-28 DIAGNOSIS — G14 Postpolio syndrome: Secondary | ICD-10-CM | POA: Diagnosis not present

## 2019-04-28 DIAGNOSIS — Z6829 Body mass index (BMI) 29.0-29.9, adult: Secondary | ICD-10-CM

## 2019-04-28 DIAGNOSIS — F419 Anxiety disorder, unspecified: Secondary | ICD-10-CM | POA: Diagnosis not present

## 2019-04-28 DIAGNOSIS — F329 Major depressive disorder, single episode, unspecified: Secondary | ICD-10-CM | POA: Diagnosis not present

## 2019-04-28 DIAGNOSIS — Z8542 Personal history of malignant neoplasm of other parts of uterus: Secondary | ICD-10-CM | POA: Diagnosis not present

## 2019-04-28 DIAGNOSIS — Z006 Encounter for examination for normal comparison and control in clinical research program: Secondary | ICD-10-CM | POA: Diagnosis not present

## 2019-04-28 DIAGNOSIS — I350 Nonrheumatic aortic (valve) stenosis: Secondary | ICD-10-CM | POA: Diagnosis not present

## 2019-04-28 DIAGNOSIS — F32A Depression, unspecified: Secondary | ICD-10-CM | POA: Diagnosis present

## 2019-04-28 DIAGNOSIS — I352 Nonrheumatic aortic (valve) stenosis with insufficiency: Principal | ICD-10-CM | POA: Diagnosis present

## 2019-04-28 DIAGNOSIS — Z952 Presence of prosthetic heart valve: Secondary | ICD-10-CM | POA: Diagnosis not present

## 2019-04-28 DIAGNOSIS — M797 Fibromyalgia: Secondary | ICD-10-CM | POA: Diagnosis not present

## 2019-04-28 DIAGNOSIS — I272 Pulmonary hypertension, unspecified: Secondary | ICD-10-CM | POA: Diagnosis present

## 2019-04-28 DIAGNOSIS — M170 Bilateral primary osteoarthritis of knee: Secondary | ICD-10-CM | POA: Diagnosis present

## 2019-04-28 DIAGNOSIS — E785 Hyperlipidemia, unspecified: Secondary | ICD-10-CM | POA: Diagnosis present

## 2019-04-28 DIAGNOSIS — R918 Other nonspecific abnormal finding of lung field: Secondary | ICD-10-CM | POA: Diagnosis not present

## 2019-04-28 DIAGNOSIS — M109 Gout, unspecified: Secondary | ICD-10-CM | POA: Diagnosis present

## 2019-04-28 DIAGNOSIS — M19042 Primary osteoarthritis, left hand: Secondary | ICD-10-CM | POA: Diagnosis present

## 2019-04-28 DIAGNOSIS — M19041 Primary osteoarthritis, right hand: Secondary | ICD-10-CM | POA: Diagnosis not present

## 2019-04-28 DIAGNOSIS — D649 Anemia, unspecified: Secondary | ICD-10-CM | POA: Diagnosis not present

## 2019-04-28 DIAGNOSIS — Z881 Allergy status to other antibiotic agents status: Secondary | ICD-10-CM

## 2019-04-28 DIAGNOSIS — Z791 Long term (current) use of non-steroidal anti-inflammatories (NSAID): Secondary | ICD-10-CM

## 2019-04-28 DIAGNOSIS — Z7982 Long term (current) use of aspirin: Secondary | ICD-10-CM

## 2019-04-28 DIAGNOSIS — H9193 Unspecified hearing loss, bilateral: Secondary | ICD-10-CM | POA: Diagnosis not present

## 2019-04-28 DIAGNOSIS — Z880 Allergy status to penicillin: Secondary | ICD-10-CM

## 2019-04-28 DIAGNOSIS — K589 Irritable bowel syndrome without diarrhea: Secondary | ICD-10-CM | POA: Diagnosis present

## 2019-04-28 DIAGNOSIS — Z885 Allergy status to narcotic agent status: Secondary | ICD-10-CM

## 2019-04-28 HISTORY — PX: TRANSCATHETER AORTIC VALVE REPLACEMENT, TRANSFEMORAL: SHX6400

## 2019-04-28 HISTORY — DX: Presence of prosthetic heart valve: Z95.2

## 2019-04-28 HISTORY — PX: TEE WITHOUT CARDIOVERSION: SHX5443

## 2019-04-28 LAB — POCT I-STAT, CHEM 8
BUN: 8 mg/dL (ref 8–23)
BUN: 7 mg/dL — ABNORMAL LOW (ref 8–23)
BUN: 8 mg/dL (ref 8–23)
Calcium, Ion: 1.26 mmol/L (ref 1.15–1.40)
Calcium, Ion: 1.21 mmol/L (ref 1.15–1.40)
Calcium, Ion: 1.25 mmol/L (ref 1.15–1.40)
Chloride: 104 mmol/L (ref 98–111)
Chloride: 103 mmol/L (ref 98–111)
Chloride: 105 mmol/L (ref 98–111)
Creatinine, Ser: 0.3 mg/dL — ABNORMAL LOW (ref 0.44–1.00)
Creatinine, Ser: 0.4 mg/dL — ABNORMAL LOW (ref 0.44–1.00)
Creatinine, Ser: 0.4 mg/dL — ABNORMAL LOW (ref 0.44–1.00)
Glucose, Bld: 138 mg/dL — ABNORMAL HIGH (ref 70–99)
Glucose, Bld: 109 mg/dL — ABNORMAL HIGH (ref 70–99)
Glucose, Bld: 125 mg/dL — ABNORMAL HIGH (ref 70–99)
HCT: 31 % — ABNORMAL LOW (ref 36.0–46.0)
HCT: 27 % — ABNORMAL LOW (ref 36.0–46.0)
HCT: 28 % — ABNORMAL LOW (ref 36.0–46.0)
Hemoglobin: 10.5 g/dL — ABNORMAL LOW (ref 12.0–15.0)
Hemoglobin: 9.2 g/dL — ABNORMAL LOW (ref 12.0–15.0)
Hemoglobin: 9.5 g/dL — ABNORMAL LOW (ref 12.0–15.0)
Potassium: 3.7 mmol/L (ref 3.5–5.1)
Potassium: 3.6 mmol/L (ref 3.5–5.1)
Potassium: 3.7 mmol/L (ref 3.5–5.1)
Sodium: 140 mmol/L (ref 135–145)
Sodium: 140 mmol/L (ref 135–145)
Sodium: 141 mmol/L (ref 135–145)
TCO2: 25 mmol/L (ref 22–32)
TCO2: 23 mmol/L (ref 22–32)
TCO2: 26 mmol/L (ref 22–32)

## 2019-04-28 LAB — POCT ACTIVATED CLOTTING TIME
Activated Clotting Time: 142 seconds
Activated Clotting Time: 121 seconds
Activated Clotting Time: 325 seconds

## 2019-04-28 SURGERY — IMPLANTATION, AORTIC VALVE, TRANSCATHETER, FEMORAL APPROACH
Anesthesia: Monitor Anesthesia Care

## 2019-04-28 MED ORDER — SODIUM CHLORIDE 0.9% FLUSH
3.0000 mL | Freq: Two times a day (BID) | INTRAVENOUS | Status: DC
  Administered 2019-04-28 – 2019-04-29 (×2): 3 mL via INTRAVENOUS

## 2019-04-28 MED ORDER — CHLORHEXIDINE GLUCONATE 4 % EX LIQD
60.0000 mL | Freq: Once | CUTANEOUS | Status: DC
  Filled 2019-04-28: qty 60

## 2019-04-28 MED ORDER — MIDAZOLAM HCL 2 MG/2ML IJ SOLN
INTRAMUSCULAR | Status: DC | PRN
  Administered 2019-04-28: 2 mg via INTRAVENOUS

## 2019-04-28 MED ORDER — ACETAMINOPHEN 650 MG RE SUPP: 650.0000 mg | Freq: Four times a day (QID) | RECTAL | Status: DC | PRN

## 2019-04-28 MED ORDER — HEPARIN SODIUM (PORCINE) 1000 UNIT/ML IJ SOLN
INTRAMUSCULAR | Status: DC | PRN
  Administered 2019-04-28: 13000 [IU] via INTRAVENOUS

## 2019-04-28 MED ORDER — MORPHINE SULFATE (PF) 2 MG/ML IV SOLN: 1.0000 mg | INTRAVENOUS | Status: DC | PRN

## 2019-04-28 MED ORDER — NITROGLYCERIN IN D5W 200-5 MCG/ML-% IV SOLN: 0.0000 ug/min | INTRAVENOUS | Status: DC

## 2019-04-28 MED ORDER — ACETAMINOPHEN 325 MG PO TABS
650.0000 mg | ORAL_TABLET | Freq: Four times a day (QID) | ORAL | Status: DC | PRN
  Administered 2019-04-28 – 2019-04-29 (×2): 650 mg via ORAL
  Filled 2019-04-28 (×2): qty 2

## 2019-04-28 MED ORDER — LACTATED RINGERS IV SOLN
INTRAVENOUS | Status: DC | PRN
  Administered 2019-04-28: 09:00:00 via INTRAVENOUS

## 2019-04-28 MED ORDER — FENTANYL CITRATE (PF) 250 MCG/5ML IJ SOLN
INTRAMUSCULAR | Status: AC
  Filled 2019-04-28: qty 5

## 2019-04-28 MED ORDER — LIDOCAINE HCL (PF) 1 % IJ SOLN
INTRAMUSCULAR | Status: DC | PRN
  Administered 2019-04-28 (×2): 10 mL via SUBCUTANEOUS

## 2019-04-28 MED ORDER — OXYCODONE HCL 5 MG PO TABS: 5.0000 mg | ORAL_TABLET | ORAL | Status: DC | PRN

## 2019-04-28 MED ORDER — SODIUM CHLORIDE 0.9% FLUSH: 3.0000 mL | INTRAVENOUS | Status: DC | PRN

## 2019-04-28 MED ORDER — EPHEDRINE SULFATE 50 MG/ML IJ SOLN
INTRAMUSCULAR | Status: DC | PRN
  Administered 2019-04-28 (×2): 10 mg via INTRAVENOUS

## 2019-04-28 MED ORDER — SODIUM CHLORIDE 0.9 % IV SOLN
INTRAVENOUS | Status: AC
  Administered 2019-04-28: 13:00:00 via INTRAVENOUS

## 2019-04-28 MED ORDER — SODIUM CHLORIDE 0.9 % IV SOLN: 250.0000 mL | INTRAVENOUS | Status: DC | PRN

## 2019-04-28 MED ORDER — ONDANSETRON HCL 4 MG/2ML IJ SOLN: 4.0000 mg | Freq: Four times a day (QID) | INTRAMUSCULAR | Status: DC | PRN

## 2019-04-28 MED ORDER — LUBIPROSTONE 24 MCG PO CAPS
24.0000 ug | ORAL_CAPSULE | Freq: Two times a day (BID) | ORAL | Status: DC
  Administered 2019-04-28: 24 ug via ORAL
  Filled 2019-04-28 (×3): qty 1

## 2019-04-28 MED ORDER — ASPIRIN EC 81 MG PO TBEC
81.0000 mg | DELAYED_RELEASE_TABLET | Freq: Every day | ORAL | Status: DC
  Administered 2019-04-28 – 2019-04-29 (×2): 81 mg via ORAL
  Filled 2019-04-28 (×2): qty 1

## 2019-04-28 MED ORDER — LIDOCAINE HCL 1 % IJ SOLN
INTRAMUSCULAR | Status: AC
  Filled 2019-04-28: qty 20

## 2019-04-28 MED ORDER — VANCOMYCIN HCL IN DEXTROSE 1-5 GM/200ML-% IV SOLN
1000.0000 mg | Freq: Once | INTRAVENOUS | Status: AC
  Administered 2019-04-28: 1000 mg via INTRAVENOUS
  Filled 2019-04-28: qty 200

## 2019-04-28 MED ORDER — CHLORHEXIDINE GLUCONATE 4 % EX LIQD
30.0000 mL | CUTANEOUS | Status: DC
  Filled 2019-04-28: qty 30

## 2019-04-28 MED ORDER — VENLAFAXINE HCL ER 75 MG PO CP24: 75.0000 mg | ORAL_CAPSULE | Freq: Every day | ORAL | Status: DC

## 2019-04-28 MED ORDER — CHLORHEXIDINE GLUCONATE 0.12 % MT SOLN
15.0000 mL | Freq: Once | OROMUCOSAL | Status: AC
  Administered 2019-04-28: 09:00:00 15 mL via OROMUCOSAL
  Filled 2019-04-28: qty 15

## 2019-04-28 MED ORDER — CLOPIDOGREL BISULFATE 75 MG PO TABS
75.0000 mg | ORAL_TABLET | Freq: Every day | ORAL | Status: DC
  Administered 2019-04-29: 75 mg via ORAL
  Filled 2019-04-28: qty 1

## 2019-04-28 MED ORDER — FLUTICASONE PROPIONATE 50 MCG/ACT NA SUSP
2.0000 | Freq: Every day | NASAL | Status: DC
  Administered 2019-04-28: 2 via NASAL
  Filled 2019-04-28: qty 16

## 2019-04-28 MED ORDER — DEXMEDETOMIDINE HCL IN NACL 200 MCG/50ML IV SOLN
INTRAVENOUS | Status: DC | PRN
  Administered 2019-04-28: 40.24 ug via INTRAVENOUS

## 2019-04-28 MED ORDER — IOHEXOL 350 MG/ML SOLN
INTRAVENOUS | Status: DC | PRN
  Administered 2019-04-28: 65 mL

## 2019-04-28 MED ORDER — FENTANYL CITRATE (PF) 250 MCG/5ML IJ SOLN
INTRAMUSCULAR | Status: DC | PRN
  Administered 2019-04-28: 50 ug via INTRAVENOUS

## 2019-04-28 MED ORDER — PROPOFOL 500 MG/50ML IV EMUL
INTRAVENOUS | Status: DC | PRN
  Administered 2019-04-28: 5 ug/kg/min via INTRAVENOUS

## 2019-04-28 MED ORDER — LEVOFLOXACIN IN D5W 750 MG/150ML IV SOLN
750.0000 mg | INTRAVENOUS | Status: AC
  Administered 2019-04-29: 750 mg via INTRAVENOUS
  Filled 2019-04-28: qty 150

## 2019-04-28 MED ORDER — HEPARIN (PORCINE) IN NACL 1000-0.9 UT/500ML-% IV SOLN
INTRAVENOUS | Status: AC
  Filled 2019-04-28: qty 1500

## 2019-04-28 MED ORDER — VENLAFAXINE HCL ER 75 MG PO CP24
75.0000 mg | ORAL_CAPSULE | Freq: Every day | ORAL | Status: DC
  Administered 2019-04-28 – 2019-04-29 (×2): 75 mg via ORAL
  Filled 2019-04-28 (×2): qty 1

## 2019-04-28 MED ORDER — PANTOPRAZOLE SODIUM 40 MG PO TBEC
40.0000 mg | DELAYED_RELEASE_TABLET | Freq: Every day | ORAL | Status: DC
  Administered 2019-04-28 – 2019-04-29 (×2): 40 mg via ORAL
  Filled 2019-04-28 (×2): qty 1

## 2019-04-28 MED ORDER — PROTAMINE SULFATE 10 MG/ML IV SOLN
INTRAVENOUS | Status: DC | PRN
  Administered 2019-04-28: 10 mg via INTRAVENOUS
  Administered 2019-04-28: 120 mg via INTRAVENOUS

## 2019-04-28 MED ORDER — SODIUM CHLORIDE 0.9 % IV SOLN
INTRAVENOUS | Status: DC
  Administered 2019-04-28: 09:00:00 via INTRAVENOUS

## 2019-04-28 MED ORDER — MIDAZOLAM HCL 2 MG/2ML IJ SOLN
INTRAMUSCULAR | Status: AC
  Filled 2019-04-28: qty 2

## 2019-04-28 MED ORDER — TRAMADOL HCL 50 MG PO TABS: 50.0000 mg | ORAL_TABLET | ORAL | Status: DC | PRN

## 2019-04-28 MED ORDER — PHENYLEPHRINE HCL-NACL 20-0.9 MG/250ML-% IV SOLN: 0.0000 ug/min | INTRAVENOUS | Status: DC

## 2019-04-28 MED ORDER — PROPOFOL 10 MG/ML IV BOLUS
INTRAVENOUS | Status: DC | PRN
  Administered 2019-04-28: 10 mg via INTRAVENOUS

## 2019-04-28 MED ORDER — CHLORHEXIDINE GLUCONATE 0.12 % MT SOLN
OROMUCOSAL | Status: AC
  Administered 2019-04-28: 15 mL via OROMUCOSAL
  Filled 2019-04-28: qty 15

## 2019-04-28 SURGICAL SUPPLY — 36 items
BAG SNAP BAND KOVER 36X36 (MISCELLANEOUS) ×6 IMPLANT
BLANKET WARM UNDERBOD FULL ACC (MISCELLANEOUS) ×3 IMPLANT
CABLE ADAPT PACING TEMP 12FT (ADAPTER) ×2 IMPLANT
CATH 23 ULTRA DELIVERY (CATHETERS) ×2 IMPLANT
CATH DIAG 6FR PIGTAIL ANGLED (CATHETERS) ×4 IMPLANT
CATH INFINITI 6F AL1 (CATHETERS) ×2 IMPLANT
CATH S G BIP PACING (CATHETERS) ×2 IMPLANT
CLOSURE MYNX CONTROL 6F/7F (Vascular Products) ×2 IMPLANT
CRIMPER (MISCELLANEOUS) ×2 IMPLANT
DEVICE CLOSURE PERCLS PRGLD 6F (VASCULAR PRODUCTS) IMPLANT
DEVICE INFLATION ATRION QL2530 (MISCELLANEOUS) ×2 IMPLANT
DEVICE INFLATION ATRION QL38 (MISCELLANEOUS) IMPLANT
ELECT DEFIB PAD ADLT CADENCE (PAD) ×2 IMPLANT
GUIDEWIRE SAFE TJ AMPLATZ EXST (WIRE) ×2 IMPLANT
KIT HEART LEFT (KITS) ×3 IMPLANT
KIT MICROPUNCTURE NIT STIFF (SHEATH) ×2 IMPLANT
PACK CARDIAC CATHETERIZATION (CUSTOM PROCEDURE TRAY) ×3 IMPLANT
PERCLOSE PROGLIDE 6F (VASCULAR PRODUCTS) ×6
SHEATH 14X36 EDWARDS (SHEATH) ×2 IMPLANT
SHEATH BRITE TIP 7FR 35CM (SHEATH) ×2 IMPLANT
SHEATH PINNACLE 6F 10CM (SHEATH) ×2 IMPLANT
SHEATH PINNACLE 8F 10CM (SHEATH) ×2 IMPLANT
SHEATH PROBE COVER 6X72 (BAG) ×2 IMPLANT
SLEEVE REPOSITIONING LENGTH 30 (MISCELLANEOUS) ×2 IMPLANT
STOPCOCK MORSE 400PSI 3WAY (MISCELLANEOUS) ×6 IMPLANT
SYR MEDRAD MARK V 150ML (SYRINGE) ×2 IMPLANT
TRANSDUCER W/STOPCOCK (MISCELLANEOUS) ×6 IMPLANT
TUBE CONN 8.8X1320 FR HP M-F (CONNECTOR) ×2 IMPLANT
TUBING ART PRESS 72  MALE/FEM (TUBING) ×2
TUBING ART PRESS 72 MALE/FEM (TUBING) IMPLANT
TUBING CIL FLEX 10 FLL-RA (TUBING) ×2 IMPLANT
VALVE 23 ULTRA SAPIEN KIT (Valve) ×2 IMPLANT
WIRE AMPLATZ SS-J .035X180CM (WIRE) ×2 IMPLANT
WIRE EMERALD 3MM-J .035X150CM (WIRE) ×2 IMPLANT
WIRE EMERALD 3MM-J .035X260CM (WIRE) ×2 IMPLANT
WIRE EMERALD ST .035X260CM (WIRE) ×2 IMPLANT

## 2019-04-28 NOTE — Progress Notes (Signed)
Pt assisted to bathroom and back to bed. Right groin oozing blood. Soft bruising present. Dressing changed and bruising marked. Oncoming nurse updated.

## 2019-04-28 NOTE — Progress Notes (Signed)
Patient ID: Veronica Greene, female   DOB: 1945/12/08, 73 y.o.   MRN: [id number] TCTS  Hemodynamically stable in sinus rhythm. Feels fine and anxious to get up moving. Neuro intact Groin site look good. ECG ok  Hgb 9.2 Transferring to 4E now.

## 2019-04-28 NOTE — Anesthesia Postprocedure Evaluation (Signed)
Anesthesia Post Note  Patient: [name]  Procedure(s) Performed: TRANSCATHETER AORTIC VALVE REPLACEMENT, TRANSFEMORAL (N/A ) TRANSESOPHAGEAL ECHOCARDIOGRAM (TEE) (N/A )     Patient location during evaluation: Cath Lab Anesthesia Type: MAC Level of consciousness: awake and alert Pain management: pain level controlled Vital Signs Assessment: post-procedure vital signs reviewed and stable Respiratory status: spontaneous breathing, nonlabored ventilation and respiratory function stable Cardiovascular status: stable and blood pressure returned to baseline Postop Assessment: no apparent nausea or vomiting Anesthetic complications: no    Last Vitals:  Vitals:   04/28/19 1305 04/28/19 1310  BP: (!) 97/35 (!) 105/58  Pulse: 67 74  Resp: (!) 26 18  Temp:    SpO2: 97% 98%    Last Pain:  Vitals:   04/28/19 1249  TempSrc: Tympanic  PainSc:                  Catalina Gravel

## 2019-04-28 NOTE — Anesthesia Preprocedure Evaluation (Addendum)
Anesthesia Evaluation  Patient identified by MRN, date of birth, ID band Patient awake    Reviewed: Allergy & Precautions, NPO status , Patient's Chart, lab work & pertinent test results, reviewed documented beta blocker date and time   Airway Mallampati: II  TM Distance: >3 FB Neck ROM: Full    Dental  (+) Dental Advisory Given, Upper Dentures, Lower Dentures   Pulmonary neg pulmonary ROS,    Pulmonary exam normal breath sounds clear to auscultation       Cardiovascular hypertension, Pt. on medications and Pt. on home beta blockers + Valvular Problems/Murmurs AS  Rhythm:Regular Rate:Normal + Systolic murmurs    Neuro/Psych PSYCHIATRIC DISORDERS Anxiety Depression h/o Polio with RLE weakness    GI/Hepatic Neg liver ROS, GERD  Medicated,  Endo/Other  negative endocrine ROS  Renal/GU negative Renal ROS     Musculoskeletal  (+) Arthritis , Fibromyalgia -  Abdominal   Peds  Hematology  (+) Blood dyscrasia, anemia ,   Anesthesia Other Findings Day of surgery medications reviewed with the patient.  Reproductive/Obstetrics                             Anesthesia Physical Anesthesia Plan  ASA: IV  Anesthesia Plan: MAC   Post-op Pain Management:    Induction: Intravenous  PONV Risk Score and Plan: 2 and Treatment may vary due to age or medical condition  Airway Management Planned: Nasal Cannula and Natural Airway  Additional Equipment: Arterial line  Intra-op Plan:   Post-operative Plan:   Informed Consent: I have reviewed the patients History and Physical, chart, labs and discussed the procedure including the risks, benefits and alternatives for the proposed anesthesia with the patient or authorized representative who has indicated his/her understanding and acceptance.     Dental advisory given  Plan Discussed with: CRNA  Anesthesia Plan Comments:         Anesthesia Quick  Evaluation

## 2019-04-28 NOTE — Progress Notes (Signed)
Pt ambulated to bathroom with standby assist. Urinated with no issues. Returned to bed for chest xray.

## 2019-04-28 NOTE — Op Note (Signed)
[facility]   MULTIDISCIPLINARY HEART VALVE TEAM   TAVR OPERATIVE NOTE   Date of Procedure:  04/28/2019  Preoperative Diagnosis: Severe Aortic Stenosis   Postoperative Diagnosis: Same   Procedure:    Transcatheter Aortic Valve Replacement - Percutaneous Left Transfemoral Approach  Edwards Sapien 3 Ultra THV (size 23 mm, model # 9750TFX, serial # [id number])   Co-Surgeons:  Gaye Pollack, MD and Lauree Chandler, MD   Anesthesiologist:  S. Gifford Shave, MD  Echocardiographer:  P. Johnsie Cancel, MD  Pre-operative Echo Findings:  Severe aortic stenosis  Normal left ventricular systolic function  Post-operative Echo Findings:  No paravalvular leak  Normal left ventricular systolic function   BRIEF CLINICAL NOTE AND INDICATIONS FOR SURGERY  This 73 year old woman has stage D, severe, symptomatic aortic stenosis with New York Heart Association class II symptoms of exertional fatigue and shortness of breath consistent with chronic diastolic congestive heart failure. I have personally reviewed her 2D echocardiogram, cardiac catheterization, and CTA studies. Her echo shows a trileaflet aortic valve with severe thickening and calcification with restricted mobility. There is severe aortic stenosis with a mean gradient of 4 mmHg with a calculated aortic valve area 0.66 cm. There is moderate aortic insufficiency. Left ventricular systolic function is normal. Cardiac catheterization shows normal coronary arteries. The mean aortic valve gradient is 50 mmHg consistent with severe aortic stenosis. I agree that aortic valve replacement is indicated in this patient. 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 Her gated cardiac CTA shows anatomy suitable for transcatheter aortic valve placement using a Sapien 3 valve. Her abdominal and pelvic CTA  shows adequate pelvic vascular anatomy to allow transfemoral insertion.  The patient and her husband were counseled at length regarding treatment alternatives for management of severe symptomatic aortic stenosis. The risks and benefits of surgical intervention has been discussed in detail. Long-term prognosis with medical therapy was discussed. Alternative approaches such as conventional surgical aortic valve replacement, transcatheter aortic valve replacement, and palliative medical therapy were compared and contrasted at length. This discussion was placed in the context of the patient's own specific clinical presentation and past medical history. All of their questions have been addressed.  Following the decision to proceed with transcatheter aortic valve replacement, a discussion was held regarding what types of management strategies would be attempted intraoperatively in the event of life-threatening complications, including whether or not the patient would be considered a candidate for the use of cardiopulmonary bypass and/or conversion to open sternotomy for attempted surgical intervention. I think she is a candidate for emergent sternotomy if needed to manage any intraoperative complications. The patient has been advised of a variety of complications that might develop including but not limited to risks of death, stroke, paravalvular leak, aortic dissection or other major vascular complications, aortic annulus rupture, device embolization, cardiac rupture or perforation, mitral regurgitation, acute myocardial infarction, arrhythmia, heart block or bradycardia requiring permanent pacemaker placement, congestive heart failure, respiratory failure, renal failure, pneumonia, infection, other late complications related to structural valve deterioration or migration, or other complications that might ultimately cause a temporary or permanent loss of functional independence or other long term morbidity. The  patient provides full informed consent for the procedure as described and all questions were answered.    DETAILS OF THE OPERATIVE PROCEDURE  PREPARATION:    The patient is brought to the operating room on the  above mentioned date and appropriate monitoring was established by the anesthesia team. The patient is placed in the supine position on the operating table.  Intravenous antibiotics are administered. The patient is monitored closely throughout the procedure under conscious sedation.  Baseline transthoracic echocardiogram was performed. The patient's abdomen and both groins are prepared and draped in a sterile manner. A time out procedure is performed.   PERIPHERAL ACCESS:    Using the modified Seldinger technique, femoral arterial and venous access was obtained with placement of 6 Fr sheaths on the right side.  A pigtail diagnostic catheter was passed through the right arterial sheath under fluoroscopic guidance into the aortic root.  A temporary transvenous pacemaker catheter was passed through the right femoral venous sheath under fluoroscopic guidance into the right ventricle.  The pacemaker was tested to ensure stable lead placement and pacemaker capture. Aortic root angiography was performed in order to determine the optimal angiographic angle for valve deployment.   TRANSFEMORAL ACCESS:   Percutaneous transfemoral access and sheath placement was performed using ultrasound guidance.  The left common femoral artery was cannulated using a micropuncture needle and appropriate location was verified using hand injection angiogram.  A pair of Abbott Perclose percutaneous closure devices were placed and a 6 French sheath replaced into the femoral artery.  The patient was heparinized systemically and ACT verified > 250 seconds.    A 14 Fr transfemoral E-sheath was introduced into the left common femoral artery after progressively dilating over an Amplatz superstiff wire. An AL-1  catheter  was used to direct a straight-tip exchange length wire across the native aortic valve into the left ventricle. This was exchanged out for a pigtail catheter and position was confirmed in the LV apex. Simultaneous LV and Ao pressures were recorded.  The pigtail catheter was exchanged for an Amplatz Extra-stiff wire in the LV apex.    BALLOON AORTIC VALVULOPLASTY:   Not performed    TRANSCATHETER HEART VALVE DEPLOYMENT:   An Edwards Sapien 3 Ultra transcatheter heart valve (size 23 mm, model #[id number], serial [id number]) was prepared and crimped per manufacturer's guidelines, and the proper orientation of the valve is confirmed on the [employer] delivery system. The valve was advanced through the introducer sheath using normal technique until in an appropriate position in the abdominal aorta beyond the sheath tip. The balloon was then retracted and using the fine-tuning wheel was centered on the valve. The valve was then advanced across the aortic arch using appropriate flexion of the catheter. The valve was carefully positioned across the aortic valve annulus. The Commander catheter was retracted using normal technique. Once final position of the valve has been confirmed by angiographic assessment, the valve is deployed while temporarily holding ventilation and during rapid ventricular pacing to maintain systolic blood pressure < 50 mmHg and pulse pressure < 10 mmHg. The balloon inflation is held for >3 seconds after reaching full deployment volume. Once the balloon has fully deflated the balloon is retracted into the ascending aorta and valve function is assessed using echocardiography. There is felt to be no paravalvular leak and no central aortic insufficiency.  The patient's hemodynamic recovery following valve deployment is good.  The deployment balloon and guidewire are both removed.    PROCEDURE COMPLETION:   The sheath was removed and femoral artery closure performed using the two previously  placed Percose devices.  Protamine was administered once femoral arterial repair was complete. The temporary pacemaker, pigtail catheters and femoral sheaths were removed with manual pressure  used for hemostasis.  A Mynx femoral closure device was utilized following removal of the diagnostic sheath in the right femoral artery.  The patient tolerated the procedure well and is transported to the cath lab recovery area in stable condition. There were no immediate intraoperative complications. All sponge instrument and needle counts are verified correct at completion of the operation.   No blood products were administered during the operation.  The patient received a total of 65 mL of intravenous contrast during the procedure.   Gaye Pollack, MD 04/28/2019

## 2019-04-28 NOTE — Progress Notes (Signed)
Pt arrived to 4e from cath lab. Pt oriented to room and staff. Vitals obtained. Telemetry box applied and CCMD notified x2. Groin sites level 0. Will continue to monitor.

## 2019-04-28 NOTE — Anesthesia Procedure Notes (Signed)
Arterial Line Insertion Start/End10/20/2020 8:46 AM Performed by: Janace Litten, CRNA, CRNA  Preanesthetic checklist: patient identified, IV checked, risks and benefits discussed, surgical consent, monitors and equipment checked and timeout performed Lidocaine 1% used for infiltration Right, radial was placed Catheter size: 20 G Hand hygiene performed  and maximum sterile barriers used  Allen's test indicative of satisfactory collateral circulation Attempts: 1 Procedure performed without using ultrasound guided technique. Following insertion, dressing applied and Biopatch. Post procedure assessment: normal  Patient tolerated the procedure well with no immediate complications.

## 2019-04-28 NOTE — Interval H&P Note (Signed)
History and Physical Interval Note:  04/28/2019 11:03 AM  Veronica Greene  has presented today for surgery, with the diagnosis of Severe Aortic Stenosis.  The various methods of treatment have been discussed with the patient and family. After consideration of risks, benefits and other options for treatment, the patient has consented to  Procedure(s): TRANSCATHETER AORTIC VALVE REPLACEMENT, TRANSFEMORAL (N/A) TRANSESOPHAGEAL ECHOCARDIOGRAM (TEE) (N/A) as a surgical intervention.  The patient's history has been reviewed, patient examined, no change in status, stable for surgery.  I have reviewed the patient's chart and labs.  Questions were answered to the patient's satisfaction.     Gaye Pollack

## 2019-04-28 NOTE — Anesthesia Procedure Notes (Signed)
Procedure Name: MAC Date/Time: 04/28/2019 10:46 AM Performed by: Janace Litten, CRNA Pre-anesthesia Checklist: Patient identified, Emergency Drugs available, Suction available and Patient being monitored Patient Re-evaluated:Patient Re-evaluated prior to induction Oxygen Delivery Method: Simple face mask

## 2019-04-28 NOTE — Transfer of Care (Signed)
Immediate Anesthesia Transfer of Care Note  Patient: Veronica Greene  Procedure(s) Performed: TRANSCATHETER AORTIC VALVE REPLACEMENT, TRANSFEMORAL (N/A ) TRANSESOPHAGEAL ECHOCARDIOGRAM (TEE) (N/A )  Patient Location: PACU  Anesthesia Type:MAC  Level of Consciousness: awake, alert , oriented and patient cooperative  Airway & Oxygen Therapy: Patient Spontanous Breathing  Post-op Assessment: Report given to RN and Post -op Vital signs reviewed and stable  Post vital signs: Reviewed and stable  Last Vitals:  Vitals Value Taken Time  BP 105/44 04/28/19 1214  Temp    Pulse 73 04/28/19 1215  Resp 20 04/28/19 1215  SpO2 94 % 04/28/19 1215  Vitals shown include unvalidated device data.  Last Pain:  Vitals:   04/28/19 0812  TempSrc: Oral  PainSc: 0-No pain         Complications: No apparent anesthesia complications

## 2019-04-28 NOTE — CV Procedure (Signed)
[facility]  TAVR OPERATIVE NOTE   Date of Procedure:  04/28/2019  Preoperative Diagnosis: Severe Aortic Stenosis   Postoperative Diagnosis: Same   Procedure:    Transcatheter Aortic Valve Replacement - Transfemoral Approach  Edwards Sapien 3 THV (size 23 mm, model # [id number], serial # [id number])   Co-Surgeons:  Lauree Chandler, MD and Gaye Pollack, MD  Anesthesiologist:  Gifford Shave  Echocardiographer:  Johnsie Cancel  Pre-operative Echo Findings:  Severe aortic stenosis  Normal left ventricular systolic function  Post-operative Echo Findings:  No paravalvular leak  Normal left ventricular systolic function  BRIEF CLINICAL NOTE AND INDICATIONS FOR SURGERY  73 yo female with history of HTN, GERD, fibromyalgia, depression, anxiety and severe aortic stenosis who is here today for TAVR. She has been followed for moderate aortic valve stenosis over the past few years. Most recent echo 02/17/19 with normal LV systolic function with QMGN=00-37%. Mild mitral regurgitation. The aortic valve is thickened and calcified with poor leaflet excursion. The mean gradient is 44 mmHg, peak gradient 74 mmHg, AVA0.66cm2, dimensionless index 0.21. This is consistent with severe aortic stenosis. There is moderate aortic valve insufficiency. Cardiac catheterization 04/09/19 with no obstructive CAD. She has progressive fatigue and dyspnea. No chest pain. She has some lower extremity edema at times  During the course of the patient's preoperative work up they have been evaluated comprehensively by a multidisciplinary team of specialists coordinated through the [facility] in the [facility].  They have been demonstrated to suffer from symptomatic severe aortic stenosis as noted above. The patient has been counseled extensively as to the relative risks and benefits of all options for the treatment of severe aortic stenosis including long term  medical therapy, conventional surgery for aortic valve replacement, and transcatheter aortic valve replacement.  The patient has been independently evaluated by Dr. Cyndia Bent with CT surgery and they are felt to be at high risk for conventional surgical aortic valve replacement. The surgeon indicated the patient would be a poor candidate for conventional surgery. Based upon review of all of the patient's preoperative diagnostic tests they are felt to be candidate for transcatheter aortic valve replacement using the transfemoral approach as an alternative to high risk conventional surgery.    Following the decision to proceed with transcatheter aortic valve replacement, a discussion has been held regarding what types of management strategies would be attempted intraoperatively in the event of life-threatening complications, including whether or not the patient would be considered a candidate for the use of cardiopulmonary bypass and/or conversion to open sternotomy for attempted surgical intervention.  The patient has been advised of a variety of complications that might develop peculiar to this approach including but not limited to risks of death, stroke, paravalvular leak, aortic dissection or other major vascular complications, aortic annulus rupture, device embolization, cardiac rupture or perforation, acute myocardial infarction, arrhythmia, heart block or bradycardia requiring permanent pacemaker placement, congestive heart failure, respiratory failure, renal failure, pneumonia, infection, other late complications related to structural valve deterioration or migration, or other complications that might ultimately cause a temporary or permanent loss of functional independence or other long term morbidity.  The patient provides full informed consent for the procedure as described and all questions were answered preoperatively.    DETAILS OF THE OPERATIVE PROCEDURE  PREPARATION:   The patient is brought to  the operating room on the above mentioned date and central monitoring was established by the anesthesia team including placement of a  radial arterial line. The patient is placed in the supine position on the operating table.  Intravenous antibiotics are administered. Conscious sedation is used.   Baseline transthoracic echocardiogram was performed. The patient's chest, abdomen, both groins, and both lower extremities are prepared and draped in a sterile manner. A time out procedure is performed.   PERIPHERAL ACCESS:   Using the modified Seldinger technique, femoral arterial and venous access were obtained with placement of 6 Fr sheaths on the right side using u/s guidance .  A pigtail diagnostic catheter was passed through the femoral arterial sheath under fluoroscopic guidance into the aortic root.  A temporary transvenous pacemaker catheter was passed through the femoral venous sheath under fluoroscopic guidance into the right ventricle.  The pacemaker was tested to ensure stable lead placement and pacemaker capture. Aortic root angiography was performed in order to determine the optimal angiographic angle for valve deployment.  TRANSFEMORAL ACCESS:  A micropuncture kit was used to gain access to the left femoral artery using u/s guidance. Position confirmed with angiography. Pre-closure with double ProGlide closure devices. The patient was heparinized systemically and ACT verified > 250 seconds.    A 14 Fr transfemoral E-sheath was introduced into the left femoral artery after progressively dilating over an Amplatz superstiff wire. An AL-1 catheter was used to direct a straight-tip exchange length wire across the native aortic valve into the left ventricle. This was exchanged out for a pigtail catheter and position was confirmed in the LV apex. Simultaneous LV and Ao pressures were recorded.  The pigtail catheter was then exchanged for an Amplatz Extra-stiff wire in the LV apex.   TRANSCATHETER HEART  VALVE DEPLOYMENT:  An Edwards Sapien 3 THV (size 23 mm) was prepared and crimped per manufacturer's guidelines, and the proper orientation of the valve is confirmed on the [employer] delivery system. The valve was advanced through the introducer sheath using normal technique until in an appropriate position in the abdominal aorta beyond the sheath tip. The balloon was then retracted and using the fine-tuning wheel was centered on the valve. The valve was then advanced across the aortic arch using appropriate flexion of the catheter. The valve was carefully positioned across the aortic valve annulus. The Commander catheter was retracted using normal technique. Once final position of the valve has been confirmed by angiographic assessment, the valve is deployed while temporarily holding ventilation and during rapid ventricular pacing to maintain systolic blood pressure < 50 mmHg and pulse pressure < 10 mmHg. The balloon inflation is held for >3 seconds after reaching full deployment volume. Once the balloon has fully deflated the balloon is retracted into the ascending aorta and valve function is assessed using TTE. There is felt to be no paravalvular leak and no central aortic insufficiency.  The patient's hemodynamic recovery following valve deployment is good.  The deployment balloon and guidewire are both removed. Echo demostrated acceptable post-procedural gradients, stable mitral valve function, and no AI.   PROCEDURE COMPLETION:  The sheath was then removed and closure devices were completed. Protamine was administered once femoral arterial repair was complete. The temporary pacemaker, pigtail catheters and femoral sheaths were removed with a Mynx closure device placed in the right femoral artery and manual pressure used for venous hemostasis.    The patient tolerated the procedure well and is transported to the surgical intensive care in stable condition. There were no immediate intraoperative  complications. All sponge instrument and needle counts are verified correct at completion of the operation.  No blood products were administered during the operation.  The patient received a total of 65 mL of intravenous contrast during the procedure.  Lauree Chandler MD 04/28/2019 12:06 PM

## 2019-04-29 ENCOUNTER — Inpatient Hospital Stay (HOSPITAL_COMMUNITY): Payer: PPO

## 2019-04-29 ENCOUNTER — Encounter (HOSPITAL_COMMUNITY): Payer: Self-pay | Admitting: Cardiovascular Disease

## 2019-04-29 DIAGNOSIS — Z952 Presence of prosthetic heart valve: Secondary | ICD-10-CM

## 2019-04-29 DIAGNOSIS — I350 Nonrheumatic aortic (valve) stenosis: Secondary | ICD-10-CM

## 2019-04-29 LAB — BASIC METABOLIC PANEL
Anion gap: 11 (ref 5–15)
BUN: 8 mg/dL (ref 8–23)
CO2: 22 mmol/L (ref 22–32)
Calcium: 8.6 mg/dL — ABNORMAL LOW (ref 8.9–10.3)
Chloride: 106 mmol/L (ref 98–111)
Creatinine, Ser: 0.57 mg/dL (ref 0.44–1.00)
GFR calc Af Amer: >60 mL/min (ref >60)
GFR calc non Af Amer: >60 mL/min (ref >60)
Glucose, Bld: 119 mg/dL — ABNORMAL HIGH (ref 70–99)
Potassium: 3.4 mmol/L — ABNORMAL LOW (ref 3.5–5.1)
Sodium: 139 mmol/L (ref 135–145)

## 2019-04-29 LAB — CBC
HCT: 31.1 % — ABNORMAL LOW (ref 36.0–46.0)
Hemoglobin: 10.7 g/dL — ABNORMAL LOW (ref 12.0–15.0)
MCH: 31.3 pg (ref 26.0–34.0)
MCHC: 34.4 g/dL (ref 30.0–36.0)
MCV: 90.9 fL (ref 80.0–100.0)
Platelets: 169 10*3/uL (ref 150–400)
RBC: 3.42 MIL/uL — ABNORMAL LOW (ref 3.87–5.11)
RDW: 13.2 % (ref 11.5–15.5)
WBC: 6.1 10*3/uL (ref 4.0–10.5)
nRBC: 0 % (ref 0.0–0.2)

## 2019-04-29 LAB — ECHOCARDIOGRAM COMPLETE
Height: 65 in
Weight: 2838.4 oz

## 2019-04-29 LAB — MAGNESIUM: Magnesium: 1.6 mg/dL — ABNORMAL LOW (ref 1.7–2.4)

## 2019-04-29 MED ORDER — CLOPIDOGREL BISULFATE 75 MG PO TABS: 75.0000 mg | ORAL_TABLET | Freq: Every day | ORAL | 1 refills | Status: DC

## 2019-04-29 MED ORDER — PANTOPRAZOLE SODIUM 40 MG PO TBEC: 40.0000 mg | DELAYED_RELEASE_TABLET | Freq: Every day | ORAL | 1 refills | Status: DC

## 2019-04-29 MED FILL — Heparin Sod (Porcine)-NaCl IV Soln 1000 Unit/500ML-0.9%: INTRAVENOUS | Qty: 500 | Status: AC

## 2019-04-29 MED FILL — Lidocaine HCl Local Inj 1%: INTRAMUSCULAR | Qty: 20 | Status: AC

## 2019-04-29 NOTE — Discharge Summary (Addendum)
[facility] VALVE TEAM  Discharge Summary    Patient ID: Veronica Greene MRN: [id number]; DOB: [birth year]  Admit date: 04/28/2019 Discharge date: 04/29/2019  Primary Care Provider: Abner Greenspan, MD  Primary Cardiologist: Glenetta Hew, MD / Dr. Angelena Form & Dr. Cyndia Bent (TAVR)  Discharge Diagnoses    Principal Problem:   S/P TAVR (transcatheter aortic valve replacement) Active Problems:   Obesity   Anxiety and depression   GERD   IBS   Gout   HLD (hyperlipidemia)   Severe calcific aortic stenosis   Essential hypertension   History of endometrial cancer   Allergies Allergies  Allergen Reactions   Clarithromycin Swelling and Other (See Comments)    Throat Swells   Penicillins Hives and Other (See Comments)    WELTS Has patient had a PCN reaction causing immediate rash, facial/tongue/throat swelling, SOB or lightheadedness with hypotension: No Has patient had a PCN reaction causing severe rash involving mucus membranes or skin necrosis: Unknown Has patient had a PCN reaction that required hospitalization:No Has patient had a PCN reaction occurring within the last 10 years: No If all of the above answers are "NO", then may proceed with Cephalosporin use.    Codeine Nausea And Vomiting    Diagnostic Studies/Procedures     TAVR OPERATIVE NOTE   Date of Procedure:                04/28/2019  Preoperative Diagnosis:      Severe Aortic Stenosis   Postoperative Diagnosis:    Same   Procedure:        Transcatheter Aortic Valve Replacement - Percutaneous Left Transfemoral Approach             Edwards Sapien 3 Ultra THV (size 23 mm, model # 9750TFX, serial # [id number])              Co-Surgeons:                        Gaye Pollack, MD and Lauree Chandler, MD   Anesthesiologist:                  Collins Scotland, MD  Echocardiographer:              Edmonia James, MD  Pre-operative Echo Findings: ? Severe aortic  stenosis ? Normal left ventricular systolic function  Post-operative Echo Findings: ? No paravalvular leak ? Normal left ventricular systolic function  _____________    Echo 04/29/19: completed but pending formal read at the time of discharge.    History of Present Illness     Veronica Greene is a 73 y.o. female with a history of HTN, GERD, fibromyalgia, endometrial cancer, post polio syndrome, depression/anxiety and severe AS who presented to [facility] on 04/28/19 for planned TAVR.  She has a history of post polio syndrome with right leg weakness and decreased length. She has a history of aortic stenosis first diagnosed around 2013 when she was noted to have a heart murmur.  This has progressed over the past 18 months going from moderate with a 23 mm mean gradient in January 2019 to severe on the most current study with a gradient of 44 mmHg.  There is also moderate aortic insufficiency.  Ventricular systolic function is normal.  She reports at least a 1 year history of progressive fatigue.  She said that she feels fairly well in the morning but by about 2:00 the  afternoon is exhausted and mild exertional dyspnea.  Cardiac catheterization 04/09/19 with no obstructive CAD.    The patient has been evaluated by the multidisciplinary valve team and felt to have severe, symptomatic aortic stenosis and to be a suitable candidate for TAVR, which was set up for 04/28/19.    Hospital Course     Consultants: none  Severe AS: s/p successful TAVR with a 23 mm Edwards Sapien 3 Ultra THV via the TF approach on 04/28/19. Post operative echo completed but pending formal read. Groin sites are stable. ECG with sinus and no high grade heart block. Continue Asprin and started on Plavix 75 mg daily.   GERD: Nexium was changed to Protonix given potential drug drug interaction with Plavix  HTN: BP mildly elevated. Resume home meds at discharge.   Arthritis: Mobic discontinued in the setting of DAPT to  minimize bleeding risk  _____________  Discharge Vitals Blood pressure (!) 147/52, pulse 65, temperature 98.4 F (36.9 C), temperature source Oral, resp. rate 18, height 5\' 5"  (1.651 m), weight 80.5 kg, SpO2 91 %.  Filed Weights   04/28/19 0812  Weight: 80.5 kg    PHYSICAL EXAM:    GEN: Well nourished, well developed, in no acute distress HEENT: normal Neck: no JVD or masses Cardiac: RRR; soft flow murmur. No rubs, or gallops,no edema  Respiratory:  clear to auscultation bilaterally, normal work of breathing GI: soft, nontender, nondistended, + BS MS: no deformity or atrophy Skin: warm and dry, no rash.  Groin sites clear without hematoma or ecchymosis  Neuro:  Alert and Oriented x 3, Strength and sensation are intact Psych: euthymic mood, full affect   Labs & Radiologic Studies    CBC Recent Labs    04/28/19 1223 04/29/19 0331  WBC  --  6.1  HGB 9.2* 10.7*  HCT 27.0* 31.1*  MCV  --  90.9  PLT  --  [id number]   Basic Metabolic Panel Recent Labs    04/28/19 1223 04/29/19 0331  NA 141 139  K 3.6 3.4*  CL 105 106  CO2  --  22  GLUCOSE 109* 119*  BUN 7* 8  CREATININE 0.40* 0.57  CALCIUM  --  8.6*  MG  --  1.6*   Liver Function Tests No results for input(s): AST, ALT, ALKPHOS, BILITOT, PROT, ALBUMIN in the last 72 hours. No results for input(s): LIPASE, AMYLASE in the last 72 hours. Cardiac Enzymes No results for input(s): CKTOTAL, CKMB, CKMBINDEX, TROPONINI in the last 72 hours. BNP Invalid input(s): POCBNP D-Dimer No results for input(s): DDIMER in the last 72 hours. Hemoglobin A1C No results for input(s): HGBA1C in the last 72 hours. Fasting Lipid Panel No results for input(s): CHOL, HDL, LDLCALC, TRIG, CHOLHDL, LDLDIRECT in the last 72 hours. Thyroid Function Tests No results for input(s): TSH, T4TOTAL, T3FREE, THYROIDAB in the last 72 hours.  Invalid input(s): FREET3 _____________  Dg Chest 2 View  Result Date: 04/24/2019 CLINICAL DATA:   Preoperative study for AVR.  No chest complaints. EXAM: CHEST - 2 VIEW COMPARISON:  Chest x-ray dated August 24, 2013. FINDINGS: The heart size and mediastinal contours are within normal limits. Both lungs are clear. The visualized skeletal structures are unremarkable. IMPRESSION: No active cardiopulmonary disease. Electronically Signed   By: Titus Dubin M.D.   On: 04/24/2019 12:28   Ct Coronary Morph W/cta Cor W/score W/ca W/cm &/or Wo/cm  Addendum Date: 04/17/2019   ADDENDUM REPORT: 04/17/2019 18:48 CLINICAL DATA:  73 year old female with severe  aortic stenosis being evaluated for a TAVR procedure. EXAM: Cardiac TAVR CT TECHNIQUE: The patient was scanned on a [employer]. A 120 kV retrospective scan was triggered in the descending thoracic aorta at 111 HU's. Gantry rotation speed was 250 msecs and collimation was .6 mm. No beta blockade or nitro were given. The 3D data set was reconstructed in 5% intervals of the R-R cycle. Systolic and diastolic phases were analyzed on a dedicated work station using MPR, MIP and VRT modes. The patient received 80 cc of contrast. FINDINGS: Aortic Valve: Trileaflet aortic valve with severely calcified and thickened leaflets and mild asymmetric calcifications extending into LVOT under the non-coronary cusp. Aorta: Normal size with mild diffuse calcifications and no dissection. Sinotubular Junction: 26 x 24 mm Ascending Thoracic Aorta: 29 x 29 mm Aortic Arch: 24 x 22 mm Descending Thoracic Aorta: 21 x 19 mm Sinus of Valsalva Measurements: Non-coronary: 30 mm Right -coronary: 29 mm Left -coronary: 30 mm Coronary Artery Height above Annulus: Left Main: 15 mm Right Coronary: 14 mm Virtual Basal Annulus Measurements: Maximum/Minimum Diameter: 27.1 x 19.1 mm Mean Diameter: 22.5 mm Perimeter: 73 mm Area: 397 mm2 Optimum Fluoroscopic Angle for Delivery: LAO 12 CAU 11 IMPRESSION: 1. Trileaflet aortic valve with severely calcified and thickened leaflets and mild  asymmetric calcifications extending into LVOT under the non-coronary cusp. Annular measurements suitable for delivery of a 23 mm Edwards-SAPIEN 3 Ultra valve. Aortic valve calcium score is 1714 consistent with severe aortic stenosis. 2. Sufficient coronary to annulus distance. 3. Optimum Fluoroscopic Angle for Delivery:  LAO 12 CAU 11 4. No thrombus in the left atrial appendage. Electronically Signed   By: Ena Dawley   On: 04/17/2019 18:48   Result Date: 04/17/2019 EXAM: OVER-READ INTERPRETATION  CT CHEST The following report is an over-read performed by radiologist Dr. Vinnie Langton of [facility], [place] on 04/17/2019. This over-read does not include interpretation of cardiac or coronary anatomy or pathology. The coronary calcium score/coronary CTA interpretation by the cardiologist is attached. COMPARISON:  None. FINDINGS: Extracardiac findings will be described separately under dictation for contemporaneously obtained CTA chest, abdomen and pelvis. IMPRESSION: Please see separate dictation for contemporaneously obtained CTA chest, abdomen and pelvis dated 04/17/2019 for full description of relevant extracardiac findings. Electronically Signed: By: Vinnie Langton M.D. On: 04/17/2019 10:36   Dg Chest Port 1 View  Result Date: 04/28/2019 CLINICAL DATA:  Post TAVR EXAM: PORTABLE CHEST 1 VIEW COMPARISON:  04/24/2019 chest radiograph. FINDINGS: Aortic valve prosthesis is in place. Stable cardiomediastinal silhouette with top-normal heart size. No pneumothorax. No pleural effusion. No pulmonary edema. Low lung volumes with mild hazy bibasilar lung opacities. IMPRESSION: 1. No pneumothorax. 2. Low lung volumes with mild hazy bibasilar lung opacities, favor atelectasis. Electronically Signed   By: Ilona Sorrel M.D.   On: 04/28/2019 16:42   Ct Angio Chest Aorta W &/or Wo Contrast  Result Date: 04/17/2019 CLINICAL DATA:  73 year old female with history of severe aortic valve stenosis. Preprocedural  study prior to potential transcatheter aortic valve replacement (TAVR) procedure. EXAM: CT ANGIOGRAPHY CHEST, ABDOMEN AND PELVIS TECHNIQUE: Multidetector CT imaging through the chest, abdomen and pelvis was performed using the standard protocol during bolus administration of intravenous contrast. Multiplanar reconstructed images and MIPs were obtained and reviewed to evaluate the vascular anatomy. CONTRAST:  150mL OMNIPAQUE IOHEXOL 350 MG/ML SOLN COMPARISON:  CT the abdomen and pelvis 12/18/2018. FINDINGS: CTA CHEST FINDINGS Cardiovascular: Heart size is normal. There is no significant pericardial fluid, thickening or pericardial  calcification. There is aortic atherosclerosis, as well as atherosclerosis of the great vessels of the mediastinum and the coronary arteries, including calcified atherosclerotic plaque in the left anterior descending coronary artery. Severe thickening calcification of the aortic valve. Severe calcifications of the mitral annulus. Mediastinum/Lymph Nodes: No pathologically enlarged mediastinal or hilar lymph nodes. Esophagus is unremarkable in appearance. No axillary lymphadenopathy. Lungs/Pleura: No suspicious appearing pulmonary nodules or masses are noted. No acute consolidative airspace disease. No pleural effusions. Musculoskeletal/Soft Tissues: There are no aggressive appearing lytic or blastic lesions noted in the visualized portions of the skeleton. CTA ABDOMEN AND PELVIS FINDINGS Hepatobiliary: No suspicious cystic or solid hepatic lesions. No intra or extrahepatic biliary ductal dilatation. Gallbladder is normal in appearance. Pancreas: No pancreatic mass. No pancreatic ductal dilatation. No pancreatic or peripancreatic fluid collections or inflammatory changes. Spleen: Unremarkable. Adrenals/Urinary Tract: Bilateral kidneys and bilateral adrenal glands are normal in appearance. No hydroureteronephrosis. Urinary bladder is normal in appearance. Stomach/Bowel: Normal appearance of  the stomach. No pathologic dilatation of small bowel or colon. The appendix is not confidently identified and may be surgically absent. Regardless, there are no inflammatory changes noted adjacent to the cecum to suggest the presence of an acute appendicitis at this time. Vascular/Lymphatic: Mild aortic atherosclerosis, without evidence of aneurysm or dissection in the abdominal or pelvic vasculature. Vascular findings and measurements pertinent to potential TAVR procedure, as detailed below. No lymphadenopathy noted in the abdomen or pelvis. Reproductive: Status post hysterectomy. Ovaries are not confidently identified may be surgically absent or atrophic. Other: No significant volume of ascites.  No pneumoperitoneum. Musculoskeletal: There are no aggressive appearing lytic or blastic lesions noted in the visualized portions of the skeleton. VASCULAR MEASUREMENTS PERTINENT TO TAVR: AORTA: Minimal Aortic Diameter- 12 x 12 mm Severity of Aortic Calcification- minimal RIGHT PELVIS: Right Common Iliac Artery - Minimal Diameter-7.8 x 7.7 mm Tortuosity-moderate Calcification - none Right External Iliac Artery - Minimal Diameter-5.4 x 5.2 mm Tortuosity-mild Calcification - none Right Common Femoral Artery - Minimal Diameter-6.0 x 5.6 mm Tortuosity-mild Calcification - none LEFT PELVIS: Left Common Iliac Artery - Minimal Diameter-9.3 x 8.8 mm Tortuosity-moderate Calcification - none Left External Iliac Artery - Minimal Diameter-6.7 x 6.6 mm Tortuosity-mild Calcification - none Left Common Femoral Artery - Minimal Diameter-7.1 x 7.0 mm Tortuosity-mild Calcification-none Review of the MIP images confirms the above findings. IMPRESSION: 1. Vascular findings and measurements pertinent to potential TAVR procedure, as detailed above. 2. Severe thickening calcification of the aortic valve, compatible with the reported clinical history of severe aortic stenosis. 3. Aortic atherosclerosis, in addition to left anterior descending  coronary artery disease. Assessment for potential risk factor modification, dietary therapy or pharmacologic therapy may be warranted, if clinically indicated. 4. Additional incidental findings, as above. Electronically Signed   By: Vinnie Langton M.D.   On: 04/17/2019 11:21   Vas US Carotid  Result Date: 04/19/2019 Carotid Arterial Duplex Study Indications:       Pre TAVR. Risk Factors:      Hypertension, hyperlipidemia. Comparison Study:  no prior Performing Technologist: June Leap RDMS, RVT  Examination Guidelines: A complete evaluation includes B-mode imaging, spectral Doppler, color Doppler, and power Doppler as needed of all accessible portions of each vessel. Bilateral testing is considered an integral part of a complete examination. Limited examinations for reoccurring indications may be performed as noted.  Right Carotid Findings: +----------+--------+--------+--------+------------------+--------+             PSV cm/s EDV cm/s Stenosis Plaque Description Comments  +----------+--------+--------+--------+------------------+--------+  CCA Prox   129      21                                             +----------+--------+--------+--------+------------------+--------+  CCA Distal 116      23                                             +----------+--------+--------+--------+------------------+--------+  ICA Prox   92       18       1-39%    heterogenous                 +----------+--------+--------+--------+------------------+--------+  ICA Distal 100      23                                             +----------+--------+--------+--------+------------------+--------+  ECA        93       10                                             +----------+--------+--------+--------+------------------+--------+ +----------+--------+-------+----------------+-------------------+             PSV cm/s EDV cms Describe         Arm Pressure (mmHG)  +----------+--------+-------+----------------+-------------------+   Subclavian 256              Multiphasic, WNL                      +----------+--------+-------+----------------+-------------------+ +---------+--------+--+--------+--+---------+  Vertebral PSV cm/s 92 EDV cm/s 22 Antegrade  +---------+--------+--+--------+--+---------+  Left Carotid Findings: +----------+--------+--------+--------+------------------+--------+             PSV cm/s EDV cm/s Stenosis Plaque Description Comments  +----------+--------+--------+--------+------------------+--------+  CCA Prox   122      18                                             +----------+--------+--------+--------+------------------+--------+  CCA Distal 126      26                                             +----------+--------+--------+--------+------------------+--------+  ICA Prox   114      28       1-39%    hypoechoic         tortuous  +----------+--------+--------+--------+------------------+--------+  ICA Distal 141      34                                             +----------+--------+--------+--------+------------------+--------+  ECA        95       10                                             +----------+--------+--------+--------+------------------+--------+ +----------+--------+--------+----------------+-------------------+  PSV cm/s EDV cm/s Describe         Arm Pressure (mmHG)  +----------+--------+--------+----------------+-------------------+  Subclavian 178               Multiphasic, WNL                      +----------+--------+--------+----------------+-------------------+ +---------+--------+---+--------+--+---------+  Vertebral PSV cm/s 103 EDV cm/s 25 Antegrade  +---------+--------+---+--------+--+---------+  Summary: Right Carotid: Velocities in the right ICA are consistent with a 1-39% stenosis. Left Carotid: Velocities in the left ICA are consistent with a 1-39% stenosis.  *See table(s) above for measurements and observations.  Electronically signed by Ruta Hinds MD on 04/19/2019 at  12:36:07 PM.    Final    Ct Angio Abd/pel W/ And/or W/o  Result Date: 04/17/2019 CLINICAL DATA:  73 year old female with history of severe aortic valve stenosis. Preprocedural study prior to potential transcatheter aortic valve replacement (TAVR) procedure. EXAM: CT ANGIOGRAPHY CHEST, ABDOMEN AND PELVIS TECHNIQUE: Multidetector CT imaging through the chest, abdomen and pelvis was performed using the standard protocol during bolus administration of intravenous contrast. Multiplanar reconstructed images and MIPs were obtained and reviewed to evaluate the vascular anatomy. CONTRAST:  158mL OMNIPAQUE IOHEXOL 350 MG/ML SOLN COMPARISON:  CT the abdomen and pelvis 12/18/2018. FINDINGS: CTA CHEST FINDINGS Cardiovascular: Heart size is normal. There is no significant pericardial fluid, thickening or pericardial calcification. There is aortic atherosclerosis, as well as atherosclerosis of the great vessels of the mediastinum and the coronary arteries, including calcified atherosclerotic plaque in the left anterior descending coronary artery. Severe thickening calcification of the aortic valve. Severe calcifications of the mitral annulus. Mediastinum/Lymph Nodes: No pathologically enlarged mediastinal or hilar lymph nodes. Esophagus is unremarkable in appearance. No axillary lymphadenopathy. Lungs/Pleura: No suspicious appearing pulmonary nodules or masses are noted. No acute consolidative airspace disease. No pleural effusions. Musculoskeletal/Soft Tissues: There are no aggressive appearing lytic or blastic lesions noted in the visualized portions of the skeleton. CTA ABDOMEN AND PELVIS FINDINGS Hepatobiliary: No suspicious cystic or solid hepatic lesions. No intra or extrahepatic biliary ductal dilatation. Gallbladder is normal in appearance. Pancreas: No pancreatic mass. No pancreatic ductal dilatation. No pancreatic or peripancreatic fluid collections or inflammatory changes. Spleen: Unremarkable. Adrenals/Urinary  Tract: Bilateral kidneys and bilateral adrenal glands are normal in appearance. No hydroureteronephrosis. Urinary bladder is normal in appearance. Stomach/Bowel: Normal appearance of the stomach. No pathologic dilatation of small bowel or colon. The appendix is not confidently identified and may be surgically absent. Regardless, there are no inflammatory changes noted adjacent to the cecum to suggest the presence of an acute appendicitis at this time. Vascular/Lymphatic: Mild aortic atherosclerosis, without evidence of aneurysm or dissection in the abdominal or pelvic vasculature. Vascular findings and measurements pertinent to potential TAVR procedure, as detailed below. No lymphadenopathy noted in the abdomen or pelvis. Reproductive: Status post hysterectomy. Ovaries are not confidently identified may be surgically absent or atrophic. Other: No significant volume of ascites.  No pneumoperitoneum. Musculoskeletal: There are no aggressive appearing lytic or blastic lesions noted in the visualized portions of the skeleton. VASCULAR MEASUREMENTS PERTINENT TO TAVR: AORTA: Minimal Aortic Diameter- 12 x 12 mm Severity of Aortic Calcification- minimal RIGHT PELVIS: Right Common Iliac Artery - Minimal Diameter-7.8 x 7.7 mm Tortuosity-moderate Calcification - none Right External Iliac Artery - Minimal Diameter-5.4 x 5.2 mm Tortuosity-mild Calcification - none Right Common Femoral Artery - Minimal Diameter-6.0 x 5.6 mm Tortuosity-mild Calcification - none LEFT PELVIS: Left Common Iliac Artery - Minimal Diameter-9.3 x 8.8 mm  Tortuosity-moderate Calcification - none Left External Iliac Artery - Minimal Diameter-6.7 x 6.6 mm Tortuosity-mild Calcification - none Left Common Femoral Artery - Minimal Diameter-7.1 x 7.0 mm Tortuosity-mild Calcification-none Review of the MIP images confirms the above findings. IMPRESSION: 1. Vascular findings and measurements pertinent to potential TAVR procedure, as detailed above. 2. Severe  thickening calcification of the aortic valve, compatible with the reported clinical history of severe aortic stenosis. 3. Aortic atherosclerosis, in addition to left anterior descending coronary artery disease. Assessment for potential risk factor modification, dietary therapy or pharmacologic therapy may be warranted, if clinically indicated. 4. Additional incidental findings, as above. Electronically Signed   By: Vinnie Langton M.D.   On: 04/17/2019 11:21   Disposition   Pt is being discharged home today in good condition.  Follow-up Plans & Appointments    Follow-up Information    Eileen Stanford, PA-C. Go on 05/06/2019.   Specialties: Cardiology, Radiology Why: @ 1:30pm, please arrive at least 10 minutes early.  Contact information: [place] [id number] [phone]          Discharge Instructions    Amb Referral to Cardiac Rehabilitation   Complete by: As directed    Diagnosis: Valve Replacement   Valve: Aortic Comment - TAVR   After initial evaluation and assessments completed: Virtual Based Care may be provided alone or in conjunction with Phase 2 Cardiac Rehab based on patient barriers.: No      Discharge Medications   Allergies as of 04/29/2019      Reactions   Clarithromycin Swelling, Other (See Comments)   Throat Swells   Penicillins Hives, Other (See Comments)   WELTS Has patient had a PCN reaction causing immediate rash, facial/tongue/throat swelling, SOB or lightheadedness with hypotension: No Has patient had a PCN reaction causing severe rash involving mucus membranes or skin necrosis: Unknown Has patient had a PCN reaction that required hospitalization:No Has patient had a PCN reaction occurring within the last 10 years: No If all of the above answers are "NO", then may proceed with Cephalosporin use.   Codeine Nausea And Vomiting      Medication List    STOP taking these medications   esomeprazole 20 MG  capsule Commonly known as: [place] by: pantoprazole 40 MG tablet   meloxicam 7.5 MG tablet Commonly known as: MOBIC     TAKE these medications   acetaminophen 325 MG tablet Commonly known as: TYLENOL Take 650 mg by mouth every 6 (six) hours as needed (for pain.).   Acetasol HC OTIC solution Generic drug: acetic acid-hydrocortisone Place 2 drops into both ears 2 (two) times daily as needed (itching).   aspirin EC 81 MG tablet Take 81 mg by mouth daily.   Ayr 0.65 % nasal spray Generic drug: sodium chloride Place 1 spray into the nose daily.   CALCIUM/VITAMIN D3/ADULT GUMMY PO Take 1 tablet by mouth 2 (two) times daily.   cetirizine 10 MG tablet Commonly known as: ZYRTEC Take 5 mg by mouth as needed for allergies.   cholecalciferol 1000 units tablet Commonly known as: VITAMIN D Take 1,000 Units by mouth daily with breakfast.   clopidogrel 75 MG tablet Commonly known as: PLAVIX Take 1 tablet (75 mg total) by mouth daily with breakfast. Start taking on: April 30, 2019   fluticasone 50 MCG/ACT nasal spray Commonly known as: FLONASE Place 2 sprays into both nostrils at bedtime.   GUAIFENESIN 1200 PO Take 1,200 mg by mouth at bedtime.  hydrochlorothiazide 25 MG tablet Commonly known as: HYDRODIURIL Take 1 tablet (25 mg total) by mouth daily with lunch.   lubiprostone 24 MCG capsule Commonly known as: Amitiza Take 1 capsule (24 mcg total) by mouth 2 (two) times daily. LUNCH & SUPPER   metoprolol succinate 25 MG 24 hr tablet Commonly known as: Toprol XL Take 1 tablet (25 mg total) by mouth daily.   multivitamin with minerals Tabs tablet Take 1 tablet by mouth daily.   pantoprazole 40 MG tablet Commonly known as: PROTONIX Take 1 tablet (40 mg total) by mouth daily. Start taking on: April 30, 2019 Replaces: esomeprazole 20 MG capsule   PHAZYME PO Take 1 tablet by mouth at bedtime as needed (for gas).   potassium chloride 10 MEQ tablet Commonly  known as: KLOR-CON Take 2 tablets (20 mEq total) by mouth 2 (two) times daily.   Systane 0.4-0.3 % Soln Generic drug: Polyethyl Glycol-Propyl Glycol Place 1-2 drops into both eyes 2 (two) times daily.   TURMERIC COMPLEX/BLACK PEPPER PO Take 15 mLs by mouth daily with breakfast.   venlafaxine XR 75 MG 24 hr capsule Commonly known as: Effexor XR Take 1 capsule (75 mg total) by mouth daily after breakfast.   vitamin C 1000 MG tablet Take 1,000 mg by mouth daily with breakfast.           Outstanding Labs/Studies   none  Duration of Discharge Encounter   Greater than 30 minutes including physician time.  Mable Fill, PA-C 04/29/2019, 10:44 AM [phone]  I have personally seen and examined this patient. I agree with the assessment and plan as outlined above. She is doing well one day post TAVR. Bilateral groins stable. Mild bruising right groin. Will continue ASA and Plavix. Discharge home today.   Lauree Chandler 04/29/2019 11:20 AM

## 2019-04-29 NOTE — Progress Notes (Signed)
CARDIAC REHAB PHASE I   PRE:  Rate/Rhythm: 93 SR  BP:  Sitting: 147/52      SaO2: 96 RA  MODE:  Ambulation: 450 ft   POST:  Rate/Rhythm: 100 ST  BP:  Sitting: 153/61    SaO2: 97 RA  Pt ambulated 487ft in hallway standby assist with front wheel walker. Pt denies SOB. Pt given heart healthy diet. Reviewed restrictions, site care and exercise guidelines. Will refer to CRP II [place].  ID:[id number] Veronica Falco, RN BSN 04/29/2019 8:47 AM

## 2019-04-29 NOTE — Progress Notes (Addendum)
  Echocardiogram 2D Echocardiogram has been performed.  POST TAVR EVALUATION   Dominyck Reser L Androw 04/29/2019, 9:46 AM

## 2019-04-29 NOTE — Discharge Instructions (Signed)
ACTIVITY AND EXERCISE °• Daily activity and exercise are an important part of your recovery. People recover at different rates depending on their general health and type of valve procedure. °• Most people recovering from TAVR feel better relatively quickly  °• No lifting, pushing, pulling more than 10 pounds (examples to avoid: groceries, vacuuming, gardening, golfing): °            - For one week with a procedure through the groin. °            - For six weeks for procedures through the chest wall or neck. °NOTE: You will typically see one of our providers 7-14 days after your procedure to discuss WHEN TO RESUME the above activities.  °  °  °DRIVING °• Do not drive until you are seen for follow up and cleared by a provider. Generally, we ask patient to not drive for 1 week after their procedure. °• If you have been told by your doctor in the past that you may not drive, you must talk with him/her before you begin driving again. °  °DRESSING °• Groin site: you may leave the clear dressing over the site for up to one week or until it falls off. °  °HYGIENE °• If you had a femoral (leg) procedure, you may take a shower when you return home. After the shower, pat the site dry. Do NOT use powder, oils or lotions in your groin area until the site has completely healed. °• If you had a chest procedure, you may shower when you return home unless specifically instructed not to by your discharging practitioner. °            - DO NOT scrub incision; pat dry with a towel. °            - DO NOT apply any lotions, oils, powders to the incision. °            - No tub baths / swimming for at least 2 weeks. °• If you notice any fevers, chills, increased pain, swelling, bleeding or pus, please contact your doctor. °  °ADDITIONAL INFORMATION °• If you are going to have an upcoming dental procedure, please contact our office as you will require antibiotics ahead of time to prevent infection on your heart valve.  ° ° °If you have any  questions or concerns you can call the structural heart phone during normal business hours 8am-4pm. If you have an urgent need after hours or weekends please call [phone] to talk to the on call provider for general cardiology. If you have an emergency that requires immediate attention, please call 911.  ° ° °After TAVR Checklist ° °Check  Test Description  ° Follow up appointment in 1-2 weeks  You will see our structural heart physician assistant, Katie Tarez Bowns. Your incision sites will be checked and you will be cleared to drive and resume all normal activities if you are doing well.    ° 1 month echo and follow up  You will have an echo to check on your new heart valve and be seen back in the office by Katie Emmons Toth. Many times the echo is not read by your appointment time, but Katie will call you later that day or the following day to report your results.  ° Follow up with your primary cardiologist You will need to be seen by your primary cardiologist in the following 3-6 months after your 1 month appointment in the valve   clinic. Often times your Plavix or Aspirin will be discontinued during this time, but this is decided on a case by case basis.   ° 1 year echo and follow up You will have another echo to check on your heart valve after 1 year and be seen back in the office by Katie Millard Bautch. This your last structural heart visit.  ° Bacterial endocarditis prophylaxis  You will have to take antibiotics for the rest of your life before all dental procedures (even teeth cleanings) to protect your heart valve. Antibiotics are also required before some surgeries. Please check with your cardiologist before scheduling any surgeries. Also, please make sure to tell us if you have a penicillin allergy as you will require an alternative antibiotic.   ° ° °

## 2019-04-29 NOTE — Progress Notes (Signed)
D/C instructions given to pt. Medications and wound care reviewed. All questions answered. IV removed, clean and  intact. Husband to escort pt home.  Tylor Gambrill, RN  

## 2019-04-30 ENCOUNTER — Telehealth: Payer: Self-pay | Admitting: Physician Assistant

## 2019-04-30 NOTE — Telephone Encounter (Signed)
  [place] [facility] TEAM   Patient contacted regarding discharge from [facility] on 10/21  Patient understands to follow up with provider Nell Range on 10/28 @1 :30pm at [place].  Patient understands discharge instructions? yes Patient understands medications and regimen? yes Patient understands to bring all medications to this visit? Yes  Having a little lightheadedness which is new. She has not taken any vitals. Will take BP and HR and let us know if there are any changes.   Angelena Form PA-C  [facility]

## 2019-05-06 ENCOUNTER — Ambulatory Visit: Payer: PPO | Admitting: Physician Assistant

## 2019-05-06 ENCOUNTER — Encounter: Payer: Self-pay | Admitting: Physician Assistant

## 2019-05-06 ENCOUNTER — Other Ambulatory Visit: Payer: Self-pay

## 2019-05-06 ENCOUNTER — Other Ambulatory Visit: Payer: Self-pay | Admitting: Family Medicine

## 2019-05-06 VITALS — BP 140/68 | HR 70 | Ht 65.0 in | Wt 177.2 lb

## 2019-05-06 DIAGNOSIS — Z952 Presence of prosthetic heart valve: Secondary | ICD-10-CM | POA: Diagnosis not present

## 2019-05-06 DIAGNOSIS — M1990 Unspecified osteoarthritis, unspecified site: Secondary | ICD-10-CM

## 2019-05-06 DIAGNOSIS — I10 Essential (primary) hypertension: Secondary | ICD-10-CM

## 2019-05-06 DIAGNOSIS — K219 Gastro-esophageal reflux disease without esophagitis: Secondary | ICD-10-CM

## 2019-05-06 DIAGNOSIS — Z1231 Encounter for screening mammogram for malignant neoplasm of breast: Secondary | ICD-10-CM

## 2019-05-06 DIAGNOSIS — Z23 Encounter for immunization: Secondary | ICD-10-CM | POA: Diagnosis not present

## 2019-05-06 NOTE — Patient Instructions (Signed)
Medication Instructions:   *If you need a refill on your cardiac medications before your next appointment, please call your pharmacy*  Lab Work:  If you have labs (blood work) drawn today and your tests are completely normal, you will receive your results only by: Marland Kitchen MyChart Message (if you have MyChart) OR . A paper copy in the mail If you have any lab test that is abnormal or we need to change your treatment, we will call you to review the results.  Testing/Procedures: None ordered today.   Follow-Up: At [facility], you and your health needs are our priority.  As part of our continuing mission to provide you with exceptional heart care, we have created designated Provider Care Teams.  These Care Teams include your primary Cardiologist (physician) and Advanced Practice Providers (APPs -  Physician Assistants and Nurse Practitioners) who all work together to provide you with the care you need, when you need it.  Your next appointment:   already scheduled 05/21/19  The format for your next appointment:   In Person  Provider:   Nell Range PA

## 2019-05-06 NOTE — Progress Notes (Addendum)
[facility] [place]                                       Cardiology Office Note    Date:  05/06/2019   ID:  Colette, Dicamillo August 22, 1945, MRN [id number]  PCP:  Abner Greenspan, MD  Cardiologist: Glenetta Hew, MD / Dr. Angelena Form & Dr. Cyndia Bent (TAVR)  CC: TOC s/p TAVR  History of Present Illness:  Veronica Greene is a 73 y.o. female with a history of HTN, GERD, fibromyalgia, endometrial cancer, post polio syndrome, depression/anxiety and severe AS s/p TAVR (04/29/19) who presents to clinic for follow up.   She has a history of post polio syndrome with right leg weakness and decreased length. She has a history of aortic stenosis first diagnosed around 2013 when she was noted to have a heart murmur. This has progressed over the past 18 months going from moderate with a 23 mm mean gradient in January 2019 to severe on the most current study with a gradient of 44 mmHg. There is also moderate aortic insufficiency. Ventricular systolic function is normal. She reported at least a 1 year history of progressive fatigue. She reported becoming exhausted and dyspneic by 2:00pm in the afternoon. Cardiac catheterization 10/1/20with no obstructive CAD.   She was evaluated by the multidisciplinary valve team and underwent successful TAVR with a 23 mm Edwards Sapien 3 Ultra THV via the TF approach on 04/28/19. Post op echo showed EF 60-65%, normally functioning TAVR with a mean gradient of 13 mm Hg and no PVL. She had an uncomplicated hospital course and was discharged on POD1. She was discharged on aspirin and plavix.   Today she was presents to clinic for follow up. No CP or SOB. No LE edema, orthopnea or PND. No dizziness or syncope. No blood in stool or urine. No palpitations. Has had some occasional lightheadedness and has a history of vertigo. No related to positional changes or head movements.    Past Medical History:  Diagnosis Date  .  Anxiety and depression 08/26/2007   Qualifier: Diagnosis of  By: Glori Bickers MD, Carmell Austria   . Arthritis    knees  . Constipation, slow transit 11/03/2010  . Degenerative disk disease 08/31/2011  . Depression with anxiety   . DERMATITIS, SEBORRHEIC 08/25/2010   Qualifier: Diagnosis of  By: Glori Bickers MD, Carmell Austria   . Endometrial cancer [facility])    02/ 2019  Diagnosed with D&C/hysteroscopy with polypectomy  . Essential hypertension 08/29/2017  . Fibromyalgia    tx mobic  . Full dentures   . GERD 08/26/2007   Qualifier: Diagnosis of  By: Glori Bickers MD, Carmell Austria   . Gout   . Hearing loss    wears bilateral hearing aids  . History of colon polyps   . History of nonmelanoma skin cancer    right lower leg  . History of poliomyelitis 08/26/2007   Qualifier: History of  By: Glori Bickers MD, Carmell Austria   . Hyperlipidemia, mild 09/02/2012  . Hypertension   . Hypokalemia 01/02/2011  . IBS (irritable bowel syndrome)    with constipation  . Obesity 08/26/2007   Qualifier: Diagnosis of  By: Glori Bickers MD, Carmell Austria   . OSTEOARTHRITIS, HANDS, BILATERAL 08/26/2007   Qualifier: Diagnosis of  By: Glori Bickers MD, Carmell Austria   . Osteopenia   . OVERACTIVE BLADDER 08/26/2007  Qualifier: Diagnosis of  By: Glori Bickers MD, Carmell Austria   . Personal history of colonic polyps 11/03/2010   05/2018 13 polyps mostly adenomas, ssp's - recall 1 year 2020 Gatha Mayer, MD, [facility]   . Poor balance 11/25/2018   With falls  Has rise in R shoe/post polio  Weak legs as well    . S/P TAVR (transcatheter aortic valve replacement)    s/p TAVR with a 35m Edwards S3U via the TF approach by Dr. BCyndia Bentand Dr. MAngelena Form . Severe calcific aortic stenosis 08/27/2017  . URINARY INCONTINENCE 08/26/2007   Qualifier: Diagnosis of  By: TGlori BickersMD, MCarmell Austria  . Wears hearing aid in both ears     Past Surgical History:  Procedure Laterality Date  . ANKLE SURGERY Right 1956  . CARPAL TUNNEL RELEASE Bilateral 1986, 1993  . COLONOSCOPY N/A 06/24/2015   Procedure: COLONOSCOPY;   Surgeon: CGatha Mayer MD;  Location: [facility];  Service: Endoscopy;  Laterality: N/A;  . DILATATION & CURETTAGE/HYSTEROSCOPY WITH MYOSURE N/A 08/09/2017   Procedure: DILATATION & CURETTAGE/HYSTEROSCOPY WITH MYOSURE;  Surgeon: CChristophe Louis MD;  [facility];  Service: Gynecology;  Laterality: N/A;  Polypectomy  . DILATION AND CURETTAGE OF UTERUS     PMB  . HAND SURGERY Bilateral 1999   Thumbs   . KNEE SURGERY Bilateral 1998   x2  . LYMPH NODE BIOPSY N/A 10/08/2017   Procedure: LYMPH NODE BIOPSY;  Surgeon: REveritt Amber MD;  Location: [facility];  Service: Gynecology;  Laterality: N/A;  . MULTIPLE TOOTH EXTRACTIONS    . NASAL SINUS SURGERY  1976  . polio surgery.     right foot - right leg 1.5" shorter than left leg - some wekness  . RIGHT/LEFT HEART CATH AND CORONARY ANGIOGRAPHY N/A 04/09/2019   Procedure: RIGHT/LEFT HEART CATH AND CORONARY ANGIOGRAPHY;  Surgeon: HLeonie Man MD;  [facility];  Service: Cardiovascular;  Laterality: N/A;  . ROBOTIC ASSISTED TOTAL HYSTERECTOMY WITH BILATERAL SALPINGO OOPHERECTOMY Bilateral 10/08/2017   Procedure: XI ROBOTIC ASSISTED TOTAL HYSTERECTOMY WITH BILATERAL SALPINGO OOPHORECTOMY;  Surgeon: REveritt Amber MD;  Location: [facility];  Service: Gynecology;  Laterality: Bilateral;  . TEE WITHOUT CARDIOVERSION N/A 04/28/2019   Procedure: TRANSESOPHAGEAL ECHOCARDIOGRAM (TEE);  Surgeon: MBurnell Blanks MD;  [facility];  Service: Open Heart Surgery;  Laterality: N/A;  . THUMB ARTHROSCOPY  1999  . TONSILLECTOMY  [id number]  73years old  . TRANSCATHETER AORTIC VALVE REPLACEMENT, TRANSFEMORAL N/A 04/28/2019   Procedure: TRANSCATHETER AORTIC VALVE REPLACEMENT, TRANSFEMORAL;  Surgeon: MBurnell Blanks MD;  [facility];  Service: Open Heart Surgery;  Laterality: N/A;  . TRANSTHORACIC ECHOCARDIOGRAM  1/'19-2/'20   a) mild LVH, ef 60-65%, grade 2 DD, Mod AoV thickening /mild calcified w/ Mod AS/Mild AR  (valve area 1.15cm^2, mean gradiant 266mg, peak grandient 4942m)/ mild LAE and RAE/ trivial TR; b) 08/2018: Mod LVH. Mod-Severe AS (mild AI) - mean gradient now 38 mmHg. Mild-Mod MAC. No MS.  --notable progression of disease  . TRANSTHORACIC ECHOCARDIOGRAM  02/17/2019   Mod LVH. GR 2 DD. Severe AS (mean gradient = 44 mmHg with Mod AI. mild MS.  . TUBAL LIGATION    . UPPER GI ENDOSCOPY     normal  . WISDOM TOOTH EXTRACTION      Current Medications: Outpatient Medications Prior to Visit  Medication Sig Dispense Refill  . acetaminophen (TYLENOL) 325 MG tablet Take 650 mg by mouth  every 6 (six) hours as needed (for pain.).    Marland Kitchen acetic acid-hydrocortisone (ACETASOL HC) OTIC solution Place 2 drops into both ears 2 (two) times daily as needed (itching).    . Ascorbic Acid (VITAMIN C) 1000 MG tablet Take 1,000 mg by mouth daily with breakfast.     . aspirin EC 81 MG tablet Take 81 mg by mouth daily.    . Black Pepper-Turmeric (TURMERIC COMPLEX/BLACK PEPPER PO) Take 15 mLs by mouth daily with breakfast.     . Calcium-Phosphorus-Vitamin D (CALCIUM/VITAMIN D3/ADULT GUMMY PO) Take 1 tablet by mouth 2 (two) times daily.     . cetirizine (ZYRTEC) 10 MG tablet Take 5 mg by mouth as needed for allergies.     . cholecalciferol (VITAMIN D) 1000 UNITS tablet Take 1,000 Units by mouth daily with breakfast.     . clopidogrel (PLAVIX) 75 MG tablet Take 1 tablet (75 mg total) by mouth daily with breakfast. 90 tablet 1  . fluticasone (FLONASE) 50 MCG/ACT nasal spray Place 2 sprays into both nostrils at bedtime.     . GUAIFENESIN 1200 PO Take 1,200 mg by mouth at bedtime.     . hydrochlorothiazide (HYDRODIURIL) 25 MG tablet Take 1 tablet (25 mg total) by mouth daily with lunch. 90 tablet 3  . lubiprostone (AMITIZA) 24 MCG capsule Take 1 capsule (24 mcg total) by mouth 2 (two) times daily. LUNCH & SUPPER 180 capsule 3  . metoprolol succinate (TOPROL XL) 25 MG 24 hr tablet Take 1 tablet (25 mg total) by mouth daily.  90 tablet 3  . Multiple Vitamin (MULTIVITAMIN WITH MINERALS) TABS tablet Take 1 tablet by mouth daily.     . pantoprazole (PROTONIX) 40 MG tablet Take 1 tablet (40 mg total) by mouth daily. 90 tablet 1  . Polyethyl Glycol-Propyl Glycol (SYSTANE) 0.4-0.3 % SOLN Place 1-2 drops into both eyes 2 (two) times daily.     . potassium chloride (K-DUR) 10 MEQ tablet Take 2 tablets (20 mEq total) by mouth 2 (two) times daily. 360 tablet 3  . Simethicone (PHAZYME PO) Take 1 tablet by mouth at bedtime as needed (for gas).     . sodium chloride (AYR) 0.65 % nasal spray Place 1 spray into the nose daily.     Marland Kitchen venlafaxine XR (EFFEXOR XR) 75 MG 24 hr capsule Take 1 capsule (75 mg total) by mouth daily after breakfast. 90 capsule 3   No facility-administered medications prior to visit.      Allergies:   Clarithromycin, Penicillins, and Codeine   Social History   Socioeconomic History  . Marital status: Married    Spouse name: Not on file  . Number of children: 2  . Years of education: Not on file  . Highest education level: High school graduate  Occupational History  . 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 Financial resource strain: Not on file  . Food insecurity    Worry: Not on file    Inability: Not on file  . Transportation needs    Medical: Not on file    Non-medical: Not on file  Tobacco Use  . Smoking status: Never Smoker  . Smokeless tobacco: Never Used  Substance and Sexual Activity  . Alcohol use: No    Alcohol/week: 0.0 standard drinks  . Drug use: No  . Sexual activity: Yes    Birth control/protection: Post-menopausal  Lifestyle  . Physical activity  Days per week: Not on file    Minutes per session: Not on file  . Stress: Not on file  Relationships  . Social Herbalist on phone: Not on file    Gets together: Not on file    Attends religious service: Not on file    Active member of club  or organization: Not on file    Attends meetings of clubs or organizations: Not on file    Relationship status: Not on file  Other Topics Concern  . Not on file  Social History Narrative   She is relatively recently remarried.  She has 2  children and 3 grandchildren.     She is currently retired.  But enjoys cleaning house and doing chores.  She likes to do yard work.  Does not routinely exercise.     Family History:  The patient's family history includes Alzheimer's disease in her mother; Cancer in her paternal uncle; Colon cancer (age of onset: 74) in her father and paternal aunt; Heart attack in her sister; Kidney disease in her mother and sister; Stroke (age of onset: 84) in her father.     ROS:   Please see the history of present illness.    ROS All other systems reviewed and are negative.   PHYSICAL EXAM:   VS:  BP 140/68   Pulse 70   Ht _0  (1.651 m)   Wt 177 lb 3.2 oz (80.4 kg)   SpO2 98%   BMI 29.49 kg/m    GEN: Well nourished, well developed, in no acute distress HEENT: normal Neck: no JVD or masses Cardiac: RRR; 2/6 soft flow murmurs. No rubs, or gallops,no edema  Respiratory:  clear to auscultation bilaterally, normal work of breathing GI: soft, nontender, nondistended, + BS MS: no deformity or atrophy Skin: warm and dry, no rash. Cherry sized knot on her right groin with healing ecchymosis.  Neuro:  Alert and Oriented x 3, Strength and sensation are intact Psych: euthymic mood, full affect   Wt Readings from Last 3 Encounters:  05/06/19 177 lb 3.2 oz (80.4 kg)  04/28/19 177 lb 6.4 oz (80.5 kg)  04/24/19 177 lb 6.4 oz (80.5 kg)      Studies/Labs Reviewed:   EKG:  EKG is ordered today.  The ekg ordered today demonstrates NSR HR 69, low voltage QRS  Recent Labs: 11/20/2018: TSH 2.34 04/24/2019: ALT 15; B Natriuretic Peptide 87.1 04/29/2019: BUN 8; Creatinine, Ser 0.57; Hemoglobin 10.7; Magnesium 1.6; Platelets 169; Potassium 3.4; Sodium 139   Lipid  Panel    Component Value Date/Time   CHOL 181 11/20/2018 0940   TRIG 147.0 11/20/2018 0940   HDL 46.90 11/20/2018 0940   CHOLHDL 4 11/20/2018 0940   VLDL 29.4 11/20/2018 0940   LDLCALC 105 (H) 11/20/2018 0940    Additional studies/ records that were reviewed today include:   TAVR OPERATIVE NOTE   Date of Procedure:04/28/2019  Preoperative Diagnosis:Severe Aortic Stenosis   Postoperative Diagnosis:Same   Procedure:   Transcatheter Aortic Valve Replacement - PercutaneousLeftTransfemoral Approach Edwards Sapien 3 Ultra THV (size 34m, model # [id number] serial # [id number]  Co-Surgeons:Bryan KAlveria Apley MD and CLauree Chandler MD   Anesthesiologist:S. TGifford Shave MD  Echocardiographer:P. NJohnsie Cancel MD  Pre-operative Echo Findings: ? Severe aortic stenosis ? Normalleft ventricular systolic function  Post-operative Echo Findings: ? Noparavalvular leak ? Normalleft ventricular systolic function  _____________    Echo 04/29/19: IMPRESSIONS  1. Left ventricular ejection fraction, by visual estimation, is 60  to 65%. The left ventricle has normal function. There is no left ventricular hypertrophy.  2. Left ventricular diastolic Doppler parameters are consistent with pseudonormalization pattern of LV diastolic filling.  3. Global right ventricle has normal systolic function.The right ventricular size is normal. No increase in right ventricular wall thickness.  4. Left atrial size was moderately dilated.  5. Right atrial size was normal.  6. Moderate calcification of the mitral valve leaflet(s).  7. Moderate mitral annular calcification.  8. Moderate thickening of the mitral valve leaflet(s).  9. The mitral valve is degenerative. Mild mitral valve regurgitation. Mild mitral stenosis. 10. The tricuspid valve is normal in structure. Tricuspid valve  regurgitation is trivial. 11. S/P TAVR 04/28/19. Valve is well seated without apparent regurgitation. 12. Aortic valve mean gradient measures 13.0 mmHg. 13. Aortic valve regurgitation was not visualized by color flow Doppler. 14. The pulmonic valve was grossly normal. Pulmonic valve regurgitation is not visualized by color flow Doppler. 15. Normal pulmonary artery systolic pressure. 16. The inferior vena cava is dilated in size with >50% respiratory variability, suggesting right atrial pressure of 8 mmHg.   ASSESSMENT & PLAN:   Severe AS s/p TAVR:doing well. Groin sites are healing well. ECG with no HAVB. SBE prophylaxis discussed; the patient is edentulous and does not go to the dentist. Continue aspirin and plavix. I will see her back 11/12 for 1 month follow up and echo.   GERD: now on protonix given potential interaction for nexium and plavix  HTN: BP is borderline today but she says it's been running better at home. No changes made today  Arthritis: Mobic discontinued in the setting of DAPT to minimize bleeding risk. Tumeric has really helped her.   Medication Adjustments/Labs and Tests Ordered: Current medicines are reviewed at length with the patient today.  Concerns regarding medicines are outlined above.  Medication changes, Labs and Tests ordered today are listed in the Patient Instructions below. Patient Instructions  Medication Instructions:   *If you need a refill on your cardiac medications before your next appointment, please call your pharmacy*  Lab Work:  If you have labs (blood work) drawn today and your tests are completely normal, you will receive your results only by: Marland Kitchen MyChart Message (if you have MyChart) OR . A paper copy in the mail If you have any lab test that is abnormal or we need to change your treatment, we will call you to review the results.  Testing/Procedures: None ordered today.   Follow-Up: At [facility], you and your health needs are  our priority.  As part of our continuing mission to provide you with exceptional heart care, we have created designated Provider Care Teams.  These Care Teams include your primary Cardiologist (physician) and Advanced Practice Providers (APPs -  Physician Assistants and Nurse Practitioners) who all work together to provide you with the care you need, when you need it.  Your next appointment:   already scheduled 05/21/19  The format for your next appointment:   In Person  Provider:   Nell Range PA     Signed, Angelena Form, PA-C  05/06/2019 2:18 PM    [address], [place], [place]  [address] Phone: [phone]; Fax: [phone]

## 2019-05-09 ENCOUNTER — Encounter: Payer: Self-pay | Admitting: Internal Medicine

## 2019-05-11 ENCOUNTER — Telehealth (HOSPITAL_COMMUNITY): Payer: Self-pay

## 2019-05-11 NOTE — Telephone Encounter (Signed)
Called and spoke with pt in regards to CR pt stated she was not ready. Mailed pt CR brochure, pt stated she will contact CR when she is ready.  Closed referral

## 2019-05-20 NOTE — Progress Notes (Signed)
[name]                                       Cardiology Office Note    Date:  05/21/2019   ID:  Veronica, Greene [birth year], MRN [id number]  PCP:  Abner Greenspan, MD  Cardiologist: Glenetta Hew, MD / Dr. Angelena Form & Dr. Cyndia Bent (TAVR)  CC: 1 month s/p TAVR  History of Present Illness:  Veronica Greene is a 73 y.o. female with a history of HTN, GERD, fibromyalgia, endometrial cancer, post polio syndrome, depression/anxiety and severe AS s/p TAVR (04/29/19) who presents to clinic for follow up.   She has a history of post polio syndrome with right leg weakness and decreased length. She has a history of aortic stenosis first diagnosed around 2013 when she was noted to have a heart murmur. This has progressed over the past 18 months going from moderate with a 23 mm mean gradient in January 2019 to severe on the most current study with a gradient of 44 mmHg. There was also moderate aortic insufficiency. Ventricular systolic function was normal. She reported at least a 1 year history of progressive fatigue. Cardiac catheterization 10/1/20showed no obstructive CAD.   She was evaluated by the multidisciplinary valve team and underwent successful TAVR with a 23 mm Edwards Sapien 3 Ultra THV via the TF approach on 04/28/19. Post op echo showed EF 60-65%, normally functioning TAVR with a mean gradient of 13 mm Hg and no PVL. She had an uncomplicated hospital course and was discharged on POD1. She was discharged on aspirin and plavix.   Today she presents to clinic for follow up. No chest pain. Had one episode of palpitations during an episode of emotional upset about her grandson with seizures. There has not been recurrence. No LE edema, orthopnea or PND. No dizziness or syncope. No blood in stool or urine. She has been working out in her garden. She thinks she has had less fatigue since TAVR. Plans to go to [facility] next  week.   Past Medical History:  Diagnosis Date  . Anxiety and depression 08/26/2007   Qualifier: Diagnosis of  By: Glori Bickers MD, Carmell Austria   . Arthritis    knees  . Constipation, slow transit 11/03/2010  . Degenerative disk disease 08/31/2011  . Depression with anxiety   . DERMATITIS, SEBORRHEIC 08/25/2010   Qualifier: Diagnosis of  By: Glori Bickers MD, Carmell Austria   . Endometrial cancer [facility])    02/ 2019  Diagnosed with D&C/hysteroscopy with polypectomy  . Essential hypertension 08/29/2017  . Fibromyalgia    tx mobic  . Full dentures   . GERD 08/26/2007   Qualifier: Diagnosis of  By: Glori Bickers MD, Carmell Austria   . Gout   . Hearing loss    wears bilateral hearing aids  . History of colon polyps   . History of nonmelanoma skin cancer    right lower leg  . History of poliomyelitis 08/26/2007   Qualifier: History of  By: Glori Bickers MD, Carmell Austria   . Hyperlipidemia, mild 09/02/2012  . Hypertension   . Hypokalemia 01/02/2011  . IBS (irritable bowel syndrome)    with constipation  . Obesity 08/26/2007   Qualifier: Diagnosis of  By: Glori Bickers MD, Carmell Austria   . OSTEOARTHRITIS, HANDS, BILATERAL 08/26/2007   Qualifier: Diagnosis of  By: Glori Bickers MD,  Carmell Austria   . Osteopenia   . OVERACTIVE BLADDER 08/26/2007   Qualifier: Diagnosis of  By: Glori Bickers MD, Carmell Austria   . Personal history of colonic polyps 11/03/2010   05/2018 13 polyps mostly adenomas, ssp's - recall 1 year 2020 Gatha Mayer, MD, [facility]   . Poor balance 11/25/2018   With falls  Has rise in R shoe/post polio  Weak legs as well    . S/P TAVR (transcatheter aortic valve replacement)    s/p TAVR with a 39m Edwards S3U via the TF approach by Dr. BCyndia Bentand Dr. MAngelena Form . Severe calcific aortic stenosis 08/27/2017  . URINARY INCONTINENCE 08/26/2007   Qualifier: Diagnosis of  By: TGlori BickersMD, MCarmell Austria  . Wears hearing aid in both ears     Past Surgical History:  Procedure Laterality Date  . ANKLE SURGERY Right 1956  . CARPAL TUNNEL RELEASE Bilateral 1986, 1993  .  COLONOSCOPY N/A 06/24/2015   Procedure: COLONOSCOPY;  Surgeon: CGatha Mayer MD;  Location: [facility];  Service: Endoscopy;  Laterality: N/A;  . DILATATION & CURETTAGE/HYSTEROSCOPY WITH MYOSURE N/A 08/09/2017   Procedure: DILATATION & CURETTAGE/HYSTEROSCOPY WITH MYOSURE;  Surgeon: CChristophe Louis MD;  [facility];  Service: Gynecology;  Laterality: N/A;  Polypectomy  . DILATION AND CURETTAGE OF UTERUS     PMB  . HAND SURGERY Bilateral 1999   Thumbs   . KNEE SURGERY Bilateral 1998   x2  . LYMPH NODE BIOPSY N/A 10/08/2017   Procedure: LYMPH NODE BIOPSY;  Surgeon: REveritt Amber MD;  Location: [facility];  Service: Gynecology;  Laterality: N/A;  . MULTIPLE TOOTH EXTRACTIONS    . NASAL SINUS SURGERY  1976  . polio surgery.     right foot - right leg 1.5" shorter than left leg - some wekness  . RIGHT/LEFT HEART CATH AND CORONARY ANGIOGRAPHY N/A 04/09/2019   Procedure: RIGHT/LEFT HEART CATH AND CORONARY ANGIOGRAPHY;  Surgeon: HLeonie Man MD;  [facility];  Service: Cardiovascular;  Laterality: N/A;  . ROBOTIC ASSISTED TOTAL HYSTERECTOMY WITH BILATERAL SALPINGO OOPHERECTOMY Bilateral 10/08/2017   Procedure: XI ROBOTIC ASSISTED TOTAL HYSTERECTOMY WITH BILATERAL SALPINGO OOPHORECTOMY;  Surgeon: REveritt Amber MD;  Location: [facility];  Service: Gynecology;  Laterality: Bilateral;  . TEE WITHOUT CARDIOVERSION N/A 04/28/2019   Procedure: TRANSESOPHAGEAL ECHOCARDIOGRAM (TEE);  Surgeon: MBurnell Blanks MD;  [facility];  Service: Open Heart Surgery;  Laterality: N/A;  . THUMB ARTHROSCOPY  1999  . TONSILLECTOMY  14719  73years old  . TRANSCATHETER AORTIC VALVE REPLACEMENT, TRANSFEMORAL N/A 04/28/2019   Procedure: TRANSCATHETER AORTIC VALVE REPLACEMENT, TRANSFEMORAL;  Surgeon: MBurnell Blanks MD;  [facility];  Service: Open Heart Surgery;  Laterality: N/A;  . TRANSTHORACIC ECHOCARDIOGRAM  1/'19-2/'20   a) mild LVH, ef 60-65%, grade 2 DD,  Mod AoV thickening /mild calcified w/ Mod AS/Mild AR (valve area 1.15cm^2, mean gradiant 249mg, peak grandient 4977m)/ mild LAE and RAE/ trivial TR; b) 08/2018: Mod LVH. Mod-Severe AS (mild AI) - mean gradient now 38 mmHg. Mild-Mod MAC. No MS.  --notable progression of disease  . TRANSTHORACIC ECHOCARDIOGRAM  02/17/2019   Mod LVH. GR 2 DD. Severe AS (mean gradient = 44 mmHg with Mod AI. mild MS.  . TUBAL LIGATION    . UPPER GI ENDOSCOPY     normal  . WISDOM TOOTH EXTRACTION      Current Medications: Outpatient Medications Prior to Visit  Medication Sig  Dispense Refill  . acetaminophen (TYLENOL) 325 MG tablet Take 650 mg by mouth every 6 (six) hours as needed (for pain.).    Marland Kitchen acetic acid-hydrocortisone (ACETASOL HC) OTIC solution Place 2 drops into both ears 2 (two) times daily as needed (itching).    . Ascorbic Acid (VITAMIN C) 1000 MG tablet Take 1,000 mg by mouth daily with breakfast.     . aspirin EC 81 MG tablet Take 81 mg by mouth daily.    . Black Pepper-Turmeric (TURMERIC COMPLEX/BLACK PEPPER PO) Take 15 mLs by mouth daily with breakfast.     . Calcium-Phosphorus-Vitamin D (CALCIUM/VITAMIN D3/ADULT GUMMY PO) Take 1 tablet by mouth 2 (two) times daily.     . cetirizine (ZYRTEC) 10 MG tablet Take 5 mg by mouth as needed for allergies.     . cholecalciferol (VITAMIN D) 1000 UNITS tablet Take 1,000 Units by mouth daily with breakfast.     . clopidogrel (PLAVIX) 75 MG tablet Take 1 tablet (75 mg total) by mouth daily with breakfast. 90 tablet 1  . fluticasone (FLONASE) 50 MCG/ACT nasal spray Place 2 sprays into both nostrils at bedtime.     . GUAIFENESIN 1200 PO Take 1,200 mg by mouth at bedtime.     . hydrochlorothiazide (HYDRODIURIL) 25 MG tablet Take 1 tablet (25 mg total) by mouth daily with lunch. 90 tablet 3  . lubiprostone (AMITIZA) 24 MCG capsule Take 1 capsule (24 mcg total) by mouth 2 (two) times daily. LUNCH & SUPPER 180 capsule 3  . metoprolol succinate (TOPROL XL) 25 MG 24  hr tablet Take 1 tablet (25 mg total) by mouth daily. 90 tablet 3  . Multiple Vitamin (MULTIVITAMIN WITH MINERALS) TABS tablet Take 1 tablet by mouth daily.     Vladimir Faster Glycol-Propyl Glycol (SYSTANE) 0.4-0.3 % SOLN Place 1-2 drops into both eyes 2 (two) times daily.     . potassium chloride (K-DUR) 10 MEQ tablet Take 2 tablets (20 mEq total) by mouth 2 (two) times daily. 360 tablet 3  . Simethicone (PHAZYME PO) Take 1 tablet by mouth at bedtime as needed (for gas).     . sodium chloride (AYR) 0.65 % nasal spray Place 1 spray into the nose daily.     Marland Kitchen venlafaxine XR (EFFEXOR XR) 75 MG 24 hr capsule Take 1 capsule (75 mg total) by mouth daily after breakfast. 90 capsule 3  . pantoprazole (PROTONIX) 40 MG tablet Take 1 tablet (40 mg total) by mouth daily. 90 tablet 1   No facility-administered medications prior to visit.      Allergies:   Clarithromycin, Penicillins, and Codeine   Social History   Socioeconomic History  . Marital status: Married    Spouse name: Not on file  . Number of children: 2  . Years of education: Not on file  . Highest education level: High school graduate  Occupational History  . 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 Financial resource strain: Not on file  . Food insecurity    Worry: Not on file    Inability: Not on file  . Transportation needs    Medical: Not on file    Non-medical: Not on file  Tobacco Use  . Smoking status: Never Smoker  . Smokeless tobacco: Never Used  Substance and Sexual Activity  . Alcohol use: No    Alcohol/week: 0.0 standard drinks  . Drug use: No  . Sexual  activity: Yes    Birth control/protection: Post-menopausal  Lifestyle  . Physical activity    Days per week: Not on file    Minutes per session: Not on file  . Stress: Not on file  Relationships  . Social Herbalist on phone: Not on file    Gets together: Not on file    Attends  religious service: Not on file    Active member of club or organization: Not on file    Attends meetings of clubs or organizations: Not on file    Relationship status: Not on file  Other Topics Concern  . Not on file  Social History Narrative   She is relatively recently remarried.  She has 2  children and 3 grandchildren.     She is currently retired.  But enjoys cleaning house and doing chores.  She likes to do yard work.  Does not routinely exercise.     Family History:  The patient's family history includes Alzheimer's disease in her mother; Cancer in her paternal uncle; Colon cancer (age of onset: 20) in her father and paternal aunt; Heart attack in her sister; Kidney disease in her mother and sister; Stroke (age of onset: 39) in her father.     ROS:   Please see the history of present illness.    ROS All other systems reviewed and are negative.   PHYSICAL EXAM:   VS:  BP (!) 148/54   Pulse 78   Ht _0  (1.651 m)   Wt 176 lb (79.8 kg)   BMI 29.29 kg/m    GEN: Well nourished, well developed, in no acute distress HEENT: normal Neck: no JVD or masses Cardiac: RRR; 2/6 soft flow murmurs. No rubs, or gallops,no edema  Respiratory:  clear to auscultation bilaterally, normal work of breathing GI: soft, nontender, nondistended, + BS MS: no deformity or atrophy Skin: warm and dry, no rash.  Neuro:  Alert and Oriented x 3, Strength and sensation are intact Psych: euthymic mood, full affect   Wt Readings from Last 3 Encounters:  05/21/19 176 lb (79.8 kg)  05/06/19 177 lb 3.2 oz (80.4 kg)  04/28/19 177 lb 6.4 oz (80.5 kg)      Studies/Labs Reviewed:   EKG:  EKG is not ordered today.   Recent Labs: 11/20/2018: TSH 2.34 04/24/2019: ALT 15; B Natriuretic Peptide 87.1 04/29/2019: BUN 8; Creatinine, Ser 0.57; Hemoglobin 10.7; Magnesium 1.6; Platelets 169; Potassium 3.4; Sodium 139   Lipid Panel    Component Value Date/Time   CHOL 181 11/20/2018 0940   TRIG 147.0  11/20/2018 0940   HDL 46.90 11/20/2018 0940   CHOLHDL 4 11/20/2018 0940   VLDL 29.4 11/20/2018 0940   LDLCALC 105 (H) 11/20/2018 0940    Additional studies/ records that were reviewed today include:   TAVR OPERATIVE NOTE   Date of Procedure:04/28/2019  Preoperative Diagnosis:Severe Aortic Stenosis   Postoperative Diagnosis:Same   Procedure:   Transcatheter Aortic Valve Replacement - PercutaneousLeftTransfemoral Approach Edwards Sapien 3 Ultra THV (size 62m, model # [id number] serial # [id number]  Co-Surgeons:Bryan KAlveria Apley MD and CLauree Chandler MD   Anesthesiologist:S. TGifford Shave MD  Echocardiographer:P. NJohnsie Cancel MD  Pre-operative Echo Findings: ? Severe aortic stenosis ? Normalleft ventricular systolic function  Post-operative Echo Findings: ? Noparavalvular leak ? Normalleft ventricular systolic function  _____________    Echo 04/29/19: IMPRESSIONS  1. Left ventricular ejection fraction, by visual estimation, is 60 to 65%. The left ventricle has normal function. There is  no left ventricular hypertrophy.  2. Left ventricular diastolic Doppler parameters are consistent with pseudonormalization pattern of LV diastolic filling.  3. Global right ventricle has normal systolic function.The right ventricular size is normal. No increase in right ventricular wall thickness.  4. Left atrial size was moderately dilated.  5. Right atrial size was normal.  6. Moderate calcification of the mitral valve leaflet(s).  7. Moderate mitral annular calcification.  8. Moderate thickening of the mitral valve leaflet(s).  9. The mitral valve is degenerative. Mild mitral valve regurgitation. Mild mitral stenosis. 10. The tricuspid valve is normal in structure. Tricuspid valve regurgitation is trivial. 11. S/P TAVR 04/28/19. Valve is well seated without  apparent regurgitation. 12. Aortic valve mean gradient measures 13.0 mmHg. 13. Aortic valve regurgitation was not visualized by color flow Doppler. 14. The pulmonic valve was grossly normal. Pulmonic valve regurgitation is not visualized by color flow Doppler. 15. Normal pulmonary artery systolic pressure. 16. The inferior vena cava is dilated in size with >50% respiratory variability, suggesting right atrial pressure of 8 mmHg.  _____________  Echo 05/21/19 IMPRESSIONS  1. Left ventricular ejection fraction, by visual estimation, is 60 to 65%. The left ventricle has normal function. Left ventricular septal wall thickness was severely increased. Severely increased left ventricular posterior wall thickness.  2. Left ventricular diastolic parameters are consistent with Grade II diastolic dysfunction (pseudonormalization).  3. Elevated left ventricular end-diastolic pressure.  4. Global right ventricle has normal systolic function.The right ventricular size is normal. No increase in right ventricular wall thickness.  5. Left atrial size was mildly dilated.  6. Right atrial size was normal.  7. The mitral valve is degenerative. Severe mitral annular calcification of the posterior MV annulus.Trace mitral valve regurgitation. No evidence of mitral stenosis.  8. The tricuspid valve is normal in structure. Tricuspid valve regurgitation is mild.  9. TAVR valve is present in the aortic position. There is trivial periprosthetic AI. Aortic valve mean gradient measures 15.0 mmHg. Aortic valve peak gradient measures 29.8 mmHg. Aortic valve area, by VTI measures 1.59 cm. 10. The pulmonic valve was normal in structure. Pulmonic valve regurgitation is not visualized. 11. Normal pulmonary artery systolic pressure. 12. The inferior vena cava is normal in size with greater than 50% respiratory variability, suggesting right atrial pressure of 3 mmHg.    ASSESSMENT & PLAN:   Severe AS s/p TAVR: echo today  showed EF 60%, normally functioning TAVR with a mean gradient of 12m Hg and trivial PVL. She has NYHA class II symptoms. She thinks she can tell a difference in her breathing and energy since TAVR. SBE prophylaxis discussed; she has full dentures and does not go to the dentist. Continue aspirin and plavix. Plavix can be discontinued after 6 months of therapy (10/2019).   GERD: now on protonix given potential drug drug interaction with Nexium and Plavix. She would like to continue on protonix long term   HTN: BP elevated today. 140/60 on my personal recheck. She says it has been running high at home too. Continue HCTZ 222mdaily and Toprol 2567maily. Her HR is in the 70s today but regularly runs in the 50s at home per patient so will not increase her BB. Will add Norvasc 5mg100mily to get her to goal. She will monitor at home and call back if consistently running >140/90.   Arthritis: I asked her to stop her Mobic while on DAPT   Medication Adjustments/Labs and Tests Ordered: Current medicines are reviewed at length with the  patient today.  Concerns regarding medicines are outlined above.  Medication changes, Labs and Tests ordered today are listed in the Patient Instructions below. Patient Instructions  Medication Instructions:  1) START AMLODIPINE 5 mg daily 2) You may STOP PLAVIX 10/27/2019   Follow-Up: You have an appointment with Dr. Ellyn Hack on 08/26/19 at 1:20PM .  You will be called to arrange your 1 year TAVR echo and office visits.    Signed, Angelena Form, PA-C  05/21/2019 2:48 PM    [facility] [place], [place], [place]  [address] Phone: [phone]; Fax: [phone]

## 2019-05-21 ENCOUNTER — Ambulatory Visit: Payer: PPO | Admitting: Physician Assistant

## 2019-05-21 ENCOUNTER — Other Ambulatory Visit: Payer: Self-pay | Admitting: Physician Assistant

## 2019-05-21 ENCOUNTER — Ambulatory Visit (HOSPITAL_COMMUNITY): Payer: PPO | Attending: Cardiology

## 2019-05-21 ENCOUNTER — Other Ambulatory Visit: Payer: Self-pay

## 2019-05-21 VITALS — BP 148/54 | HR 78 | Ht 65.0 in | Wt 176.0 lb

## 2019-05-21 DIAGNOSIS — K219 Gastro-esophageal reflux disease without esophagitis: Secondary | ICD-10-CM

## 2019-05-21 DIAGNOSIS — L570 Actinic keratosis: Secondary | ICD-10-CM | POA: Diagnosis not present

## 2019-05-21 DIAGNOSIS — I10 Essential (primary) hypertension: Secondary | ICD-10-CM | POA: Diagnosis not present

## 2019-05-21 DIAGNOSIS — M1990 Unspecified osteoarthritis, unspecified site: Secondary | ICD-10-CM

## 2019-05-21 DIAGNOSIS — Z952 Presence of prosthetic heart valve: Secondary | ICD-10-CM | POA: Insufficient documentation

## 2019-05-21 DIAGNOSIS — R202 Paresthesia of skin: Secondary | ICD-10-CM | POA: Diagnosis not present

## 2019-05-21 DIAGNOSIS — D225 Melanocytic nevi of trunk: Secondary | ICD-10-CM | POA: Diagnosis not present

## 2019-05-21 DIAGNOSIS — D1801 Hemangioma of skin and subcutaneous tissue: Secondary | ICD-10-CM | POA: Diagnosis not present

## 2019-05-21 DIAGNOSIS — Z85828 Personal history of other malignant neoplasm of skin: Secondary | ICD-10-CM | POA: Diagnosis not present

## 2019-05-21 DIAGNOSIS — L814 Other melanin hyperpigmentation: Secondary | ICD-10-CM | POA: Diagnosis not present

## 2019-05-21 DIAGNOSIS — L821 Other seborrheic keratosis: Secondary | ICD-10-CM | POA: Diagnosis not present

## 2019-05-21 MED ORDER — PANTOPRAZOLE SODIUM 40 MG PO TBEC: 40.0000 mg | DELAYED_RELEASE_TABLET | Freq: Every day | ORAL | 3 refills | Status: DC

## 2019-05-21 MED ORDER — AMLODIPINE BESYLATE 5 MG PO TABS: 5.0000 mg | ORAL_TABLET | Freq: Every day | ORAL | 3 refills | Status: DC

## 2019-05-21 NOTE — Patient Instructions (Addendum)
Medication Instructions:  1) START AMLODIPINE 5 mg daily 2) You may STOP PLAVIX 10/27/2019   Follow-Up: You have an appointment with Dr. Ellyn Hack on 08/26/19 at 1:20PM .  You will be called to arrange your 1 year TAVR echo and office visits.

## 2019-05-26 ENCOUNTER — Telehealth: Payer: Self-pay

## 2019-05-26 NOTE — Telephone Encounter (Signed)
I followed up with the pt and advised that she continue to monitor symptoms and if she has further issues with palpitations then she can contact Cardiology.  Pt agreed with plan.

## 2019-05-26 NOTE — Telephone Encounter (Signed)
No changes today. I will include Veronica Greene cardiologist Dr. Ellyn Hack on this message. If she has more palpitations, she may need a Zio patch cardiac monitor. Gerald Stabs

## 2019-05-26 NOTE — Telephone Encounter (Signed)
OK - thnx.  DH 

## 2019-05-26 NOTE — Telephone Encounter (Signed)
  [facility]   MULTIDISCIPLINARY HEART VALVE TEAM  The pt called today with concerns about her "heart racing" yesterday after carrying out the trash.  The pt came inside and sat down and checked her vitals.  She said her pulse was 86 and her systolic BP was [id number].  After sitting for a few minutes her heart racing settled down.  The pt denies any skipping or irregular feeling in pulse. The pt has experienced one other episode of palpitations since TAVR but this was during an episode of emotional upset about her grandson with seizures. I reviewed the pt's medications and she is taking metoprolol succinate as prescribed and she takes this at 6 PM because it makes her tired and sleepy. The pt only consumes one to one and a half cups of coffee per day.  At this time I have advised the pt to continue to monitor her symptoms and I will forward this message to Dr Angelena Form for further review. Pt agreed with plan.

## 2019-05-29 ENCOUNTER — Encounter: Payer: Self-pay | Admitting: Podiatry

## 2019-05-29 ENCOUNTER — Ambulatory Visit (INDEPENDENT_AMBULATORY_CARE_PROVIDER_SITE_OTHER): Payer: PPO

## 2019-05-29 ENCOUNTER — Ambulatory Visit: Payer: PPO | Admitting: Podiatry

## 2019-05-29 ENCOUNTER — Other Ambulatory Visit: Payer: Self-pay

## 2019-05-29 ENCOUNTER — Other Ambulatory Visit: Payer: Self-pay | Admitting: Podiatry

## 2019-05-29 DIAGNOSIS — M79672 Pain in left foot: Secondary | ICD-10-CM

## 2019-05-29 DIAGNOSIS — M778 Other enthesopathies, not elsewhere classified: Secondary | ICD-10-CM | POA: Diagnosis not present

## 2019-05-30 NOTE — Progress Notes (Signed)
Subjective:   Patient ID: Veronica Greene, female   DOB: 73 y.o.   MRN: [id number]   HPI Patient states getting some pain again on the top of the left foot and it does well for periods of time and then it seems like it gradually flares up   ROS      Objective:  Physical Exam  Neurovascular status intact with inflammation of the first MPJ left localized in nature with discomfort into the midfoot noted.  Mild callus formation also noted     Assessment:  Inflammatory tendinitis with inflammation left     Plan:  Sterile prep and did do an injection 3 mg Dexasone Kenalog around the extensor tendon inflammation and into the first MPJ and patient will be seen back as needed.  Discussed new orthotics which may be necessary for this patient due to the inflammation and her severe limb length discrepancy and she will look into getting a new pair which I think would be important as her older pair have started to wear down  X-rays indicate that there is no signs of a fracture or any kind of bone injury associated with the inflammation

## 2019-06-18 ENCOUNTER — Ambulatory Visit (INDEPENDENT_AMBULATORY_CARE_PROVIDER_SITE_OTHER): Payer: PPO | Admitting: Orthotics

## 2019-06-18 ENCOUNTER — Other Ambulatory Visit: Payer: Self-pay

## 2019-06-18 DIAGNOSIS — M722 Plantar fascial fibromatosis: Secondary | ICD-10-CM

## 2019-06-18 DIAGNOSIS — M79672 Pain in left foot: Secondary | ICD-10-CM

## 2019-06-18 NOTE — Progress Notes (Signed)
Ordering new set of f/o as old ones are no longer offering adequate arch support'; also offloading tailors bunion area.

## 2019-06-19 ENCOUNTER — Telehealth: Payer: Self-pay | Admitting: Podiatry

## 2019-06-19 NOTE — Telephone Encounter (Signed)
Please call pt and discuss new orthotics that were ordered 06/18/2019

## 2019-06-22 ENCOUNTER — Ambulatory Visit: Payer: PPO | Admitting: Cardiology

## 2019-06-25 ENCOUNTER — Inpatient Hospital Stay: Payer: PPO | Attending: Gynecologic Oncology | Admitting: Gynecologic Oncology

## 2019-06-25 ENCOUNTER — Other Ambulatory Visit: Payer: Self-pay

## 2019-06-25 ENCOUNTER — Encounter: Payer: Self-pay | Admitting: Gynecologic Oncology

## 2019-06-25 VITALS — BP 154/53 | HR 66 | Ht 65.0 in | Wt 176.5 lb

## 2019-06-25 DIAGNOSIS — K589 Irritable bowel syndrome without diarrhea: Secondary | ICD-10-CM | POA: Insufficient documentation

## 2019-06-25 DIAGNOSIS — F329 Major depressive disorder, single episode, unspecified: Secondary | ICD-10-CM | POA: Diagnosis not present

## 2019-06-25 DIAGNOSIS — Z923 Personal history of irradiation: Secondary | ICD-10-CM | POA: Diagnosis not present

## 2019-06-25 DIAGNOSIS — E669 Obesity, unspecified: Secondary | ICD-10-CM | POA: Diagnosis not present

## 2019-06-25 DIAGNOSIS — E785 Hyperlipidemia, unspecified: Secondary | ICD-10-CM | POA: Insufficient documentation

## 2019-06-25 DIAGNOSIS — I10 Essential (primary) hypertension: Secondary | ICD-10-CM | POA: Insufficient documentation

## 2019-06-25 DIAGNOSIS — Z90710 Acquired absence of both cervix and uterus: Secondary | ICD-10-CM | POA: Diagnosis not present

## 2019-06-25 DIAGNOSIS — R011 Cardiac murmur, unspecified: Secondary | ICD-10-CM | POA: Insufficient documentation

## 2019-06-25 DIAGNOSIS — K219 Gastro-esophageal reflux disease without esophagitis: Secondary | ICD-10-CM | POA: Insufficient documentation

## 2019-06-25 DIAGNOSIS — C541 Malignant neoplasm of endometrium: Secondary | ICD-10-CM | POA: Diagnosis not present

## 2019-06-25 DIAGNOSIS — Z7982 Long term (current) use of aspirin: Secondary | ICD-10-CM | POA: Diagnosis not present

## 2019-06-25 DIAGNOSIS — F419 Anxiety disorder, unspecified: Secondary | ICD-10-CM | POA: Insufficient documentation

## 2019-06-25 DIAGNOSIS — Z79899 Other long term (current) drug therapy: Secondary | ICD-10-CM | POA: Insufficient documentation

## 2019-06-25 DIAGNOSIS — Z7901 Long term (current) use of anticoagulants: Secondary | ICD-10-CM | POA: Insufficient documentation

## 2019-06-25 DIAGNOSIS — M797 Fibromyalgia: Secondary | ICD-10-CM | POA: Diagnosis not present

## 2019-06-25 DIAGNOSIS — Z952 Presence of prosthetic heart valve: Secondary | ICD-10-CM | POA: Insufficient documentation

## 2019-06-25 DIAGNOSIS — E876 Hypokalemia: Secondary | ICD-10-CM | POA: Diagnosis not present

## 2019-06-25 DIAGNOSIS — M8580 Other specified disorders of bone density and structure, unspecified site: Secondary | ICD-10-CM | POA: Insufficient documentation

## 2019-06-25 DIAGNOSIS — Z90722 Acquired absence of ovaries, bilateral: Secondary | ICD-10-CM | POA: Diagnosis not present

## 2019-06-25 NOTE — Patient Instructions (Signed)
Please notify Dr Denman George at phone number [phone] if you notice vaginal bleeding, new pelvic or abdominal pains, bloating, feeling full easy, or a change in bladder or bowel function.  Dr Denman George will see you again in 6 months. Dr Sondra Come will see you in 3 months.

## 2019-06-25 NOTE — Progress Notes (Signed)
Follow-up Note: Gyn-Onc  Consult was requested by Dr. Landry Mellow for the evaluation of Veronica Greene 73 y.o. female  CC:  Chief Complaint  Patient presents with  . Endometrial cancer [facility])    Assessment/Plan:  Veronica Greene  is a 73 y.o.  year old with stage IB grade 2 endometrioid endometrial adenocarcinoma (MSI high/MMR normal).  High/intermediate risk factors for recurrence. s/p vaginal brachytherapy completed 12/19/17.  I recommend she continue follow-up at 3 monthly intervals for symptom review, physical examination and pelvic examination. Pap smear is not recommended in routine endometrial cancer surveillance. After 2 years we will space these visits to every 6 months, and then annually if recurrence has not developed within 5 years. All questions were answered.  HPI: Veronica Greene is a 73 year old parous woman who is seen in consultation at the request of Dr Landry Mellow for grade 1 endometrial cancer.  She first began vaginal spotting in July 2017.  She was seen for an annual visit with Dr. Landry Mellow in October 2018 and reported this symptom.  A transvaginal ultrasound scan was performed on June 25, 2017 and revealed a normal-sized uterus measuring 7 x 4 x 2.7 cm with normal ovaries bilaterally.  The endometrial thickness was 1.28 cm.  On saline infusion the endometrial hyperechoic mass measured 7 mm. The tumor was MSI high, MMR normal.  She had MLH1 promotor methylation in her tumor but is negative for Lynch syndrome on genetic testing.   She was noted to have a systolic cardiac murmur and was seen by cardiology who performed a transthoracic echo on July 30, 2017.  This revealed moderate aortic stenosis.  The left ventricle was grossly normal in cavity size, though with some increased ventricular wall thickness.  Ejection fraction was 60-65%.  She was cleared for surgical procedure.  Of note she was asymptomatic from this aortic stenosis.  She has a remote history of rheumatic  heart disease as a child.  On August 09, 2017 she underwent a hysteroscopy D&C.  Final pathology confirmed a FIGO grade 1 endometrioid endometrial adenocarcinoma.  The patient is otherwise very healthy.  She has had no prior abdominal surgeries.  On 10/08/17 she underwent robotic assisted total hysterectomy, BSO and SLN biopsy. Final pathology revealed a 3.2cm grade 2 endometrioid endometrial adenocarcinoma with outer half myo invasion (1 of 1.2cm) and no LVSI. All SLN's were negative as were adnexa and cervix. MSI pending. She was determined to have high/intermediate risk factors and was recommended to have vaginal brachytherapy in accordance with NCCN guidelines.  She completed vaginal brachytherapy with 30Gy in 5 fractions on 12/19/17. She has persistent right lower quadrant intermittent pains which were evaluated with a CT scan in June, 2020 which was negative for recurrence.  Interval Hx:  She had an aortic valve replaced in 2020 and is doing very well after this. She will be on anticoagulant therapy for 6 months.   Current Meds:  Outpatient Encounter Medications as of 06/25/2019  Medication Sig  . acetaminophen (TYLENOL) 325 MG tablet Take 650 mg by mouth every 6 (six) hours as needed (for pain.).  Marland Kitchen acetic acid-hydrocortisone (ACETASOL HC) OTIC solution Place 2 drops into both ears 2 (two) times daily as needed (itching).  Marland Kitchen amLODipine (NORVASC) 5 MG tablet Take 1 tablet (5 mg total) by mouth daily.  . Ascorbic Acid (VITAMIN C) 1000 MG tablet Take 1,000 mg by mouth daily with breakfast.   . aspirin EC 81 MG tablet Take 81 mg by  mouth daily.  . Black Pepper-Turmeric (TURMERIC COMPLEX/BLACK PEPPER PO) Take 15 mLs by mouth daily with breakfast.   . Calcium-Phosphorus-Vitamin D (CALCIUM/VITAMIN D3/ADULT GUMMY PO) Take 1 tablet by mouth 2 (two) times daily.   . cetirizine (ZYRTEC) 10 MG tablet Take 5 mg by mouth as needed for allergies.   . cholecalciferol (VITAMIN D) 1000 UNITS tablet  Take 1,000 Units by mouth daily with breakfast.   . clopidogrel (PLAVIX) 75 MG tablet Take 1 tablet (75 mg total) by mouth daily with breakfast.  . fluticasone (FLONASE) 50 MCG/ACT nasal spray Place 2 sprays into both nostrils at bedtime.   . GUAIFENESIN 1200 PO Take 1,200 mg by mouth at bedtime.   . hydrochlorothiazide (HYDRODIURIL) 25 MG tablet Take 1 tablet (25 mg total) by mouth daily with lunch.  . lubiprostone (AMITIZA) 24 MCG capsule Take 1 capsule (24 mcg total) by mouth 2 (two) times daily. LUNCH & SUPPER  . metoprolol succinate (TOPROL XL) 25 MG 24 hr tablet Take 1 tablet (25 mg total) by mouth daily.  . Multiple Vitamin (MULTIVITAMIN WITH MINERALS) TABS tablet Take 1 tablet by mouth daily.   . pantoprazole (PROTONIX) 40 MG tablet Take 1 tablet (40 mg total) by mouth daily.  Vladimir Faster Glycol-Propyl Glycol (SYSTANE) 0.4-0.3 % SOLN Place 1-2 drops into both eyes 2 (two) times daily.   . potassium chloride (K-DUR) 10 MEQ tablet Take 2 tablets (20 mEq total) by mouth 2 (two) times daily.  . Simethicone (PHAZYME PO) Take 1 tablet by mouth at bedtime as needed (for gas).   . sodium chloride (AYR) 0.65 % nasal spray Place 1 spray into the nose daily.   Marland Kitchen venlafaxine XR (EFFEXOR XR) 75 MG 24 hr capsule Take 1 capsule (75 mg total) by mouth daily after breakfast.   No facility-administered encounter medications on file as of 06/25/2019.    Allergy:  Allergies  Allergen Reactions  . Clarithromycin Swelling and Other (See Comments)    Throat Swells  . Penicillins Hives and Other (See Comments)    WELTS Has patient had a PCN reaction causing immediate rash, facial/tongue/throat swelling, SOB or lightheadedness with hypotension: No Has patient had a PCN reaction causing severe rash involving mucus membranes or skin necrosis: Unknown Has patient had a PCN reaction that required hospitalization:No Has patient had a PCN reaction occurring within the last 10 years: No If all of the above  answers are "NO", then may proceed with Cephalosporin use.   . Codeine Nausea And Vomiting    Social Hx:   Social History   Socioeconomic History  . Marital status: Married    Spouse name: Not on file  . Number of children: 2  . Years of education: Not on file  . Highest education level: High school graduate  Occupational History  . 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 Smoking status: Never Smoker  . Smokeless tobacco: Never Used  Substance and Sexual Activity  . Alcohol use: No    Alcohol/week: 0.0 standard drinks  . Drug use: No  . Sexual activity: Yes    Birth control/protection: Post-menopausal  Other Topics Concern  . Not on file  Social History Narrative   She is relatively recently remarried.  She has 2  children and 3 grandchildren.     She is currently retired.  But enjoys cleaning house and doing chores.  She likes to do yard work.  Does not routinely exercise.   Social Determinants of Health   Financial Resource Strain:   . Difficulty of Paying Living Expenses: Not on file  Food Insecurity:   . Worried About Charity fundraiser in the Last Year: Not on file  . Ran Out of Food in the Last Year: Not on file  Transportation Needs:   . Lack of Transportation (Medical): Not on file  . Lack of Transportation (Non-Medical): Not on file  Physical Activity:   . Days of Exercise per Week: Not on file  . Minutes of Exercise per Session: Not on file  Stress:   . Feeling of Stress : Not on file  Social Connections:   . Frequency of Communication with Friends and Family: Not on file  . Frequency of Social Gatherings with Friends and Family: Not on file  . Attends Religious Services: Not on file  . Active Member of Clubs or Organizations: Not on file  . Attends Archivist Meetings: Not on file  . Marital Status: Not on file  Intimate Partner Violence:   . Fear of Current or Ex-Partner:  Not on file  . Emotionally Abused: Not on file  . Physically Abused: Not on file  . Sexually Abused: Not on file    Past Surgical Hx:  Past Surgical History:  Procedure Laterality Date  . ANKLE SURGERY Right 1956  . CARPAL TUNNEL RELEASE Bilateral 1986, 1993  . COLONOSCOPY N/A 06/24/2015   Procedure: COLONOSCOPY;  Surgeon: Gatha Mayer, MD;  Location: [facility];  Service: Endoscopy;  Laterality: N/A;  . DILATATION & CURETTAGE/HYSTEROSCOPY WITH MYOSURE N/A 08/09/2017   Procedure: DILATATION & CURETTAGE/HYSTEROSCOPY WITH MYOSURE;  Surgeon: Christophe Louis, MD;  Location: [facility];  Service: Gynecology;  Laterality: N/A;  Polypectomy  . DILATION AND CURETTAGE OF UTERUS     PMB  . HAND SURGERY Bilateral 1999   Thumbs   . KNEE SURGERY Bilateral 1998   x2  . LYMPH NODE BIOPSY N/A 10/08/2017   Procedure: LYMPH NODE BIOPSY;  Surgeon: [name], MD;  Location: [facility];  Service: Gynecology;  Laterality: N/A;  . MULTIPLE TOOTH EXTRACTIONS    . NASAL SINUS SURGERY  1976  . polio surgery.     right foot - right leg 1.5" shorter than left leg - some wekness  . RIGHT/LEFT HEART CATH AND CORONARY ANGIOGRAPHY N/A 04/09/2019   Procedure: RIGHT/LEFT HEART CATH AND CORONARY ANGIOGRAPHY;  Surgeon: Leonie Man, MD;  Location: [place] [facility];  Service: Cardiovascular;  Laterality: N/A;  . ROBOTIC ASSISTED TOTAL HYSTERECTOMY WITH BILATERAL SALPINGO OOPHERECTOMY Bilateral 10/08/2017   Procedure: XI ROBOTIC ASSISTED TOTAL HYSTERECTOMY WITH BILATERAL SALPINGO OOPHORECTOMY;  Surgeon: [name], MD;  Location: [facility];  Service: Gynecology;  Laterality: Bilateral;  . TEE WITHOUT CARDIOVERSION N/A 04/28/2019   Procedure: TRANSESOPHAGEAL ECHOCARDIOGRAM (TEE);  Surgeon: Burnell Blanks, MD;  Location: [place] [facility];  Service: Open Heart Surgery;  Laterality: N/A;  . THUMB ARTHROSCOPY  1999  . TONSILLECTOMY  3767   73 years old  . TRANSCATHETER AORTIC VALVE REPLACEMENT, TRANSFEMORAL N/A 04/28/2019    Procedure: TRANSCATHETER AORTIC VALVE REPLACEMENT, TRANSFEMORAL;  Surgeon: Burnell Blanks, MD;  Location: [place] [facility];  Service: Open Heart Surgery;  Laterality: N/A;  . TRANSTHORACIC ECHOCARDIOGRAM  1/'19-2/'20   a) mild LVH, ef 60-65%, grade 2 DD, Mod AoV thickening /mild calcified w/ Mod AS/Mild AR (valve area 1.15cm^2, mean gradiant 7mHg, peak grandient 439mg)/ mild LAE and RAE/  trivial TR; b) 08/2018: Mod LVH. Mod-Severe AS (mild AI) - mean gradient now 38 mmHg. Mild-Mod MAC. No Veronica.  --notable progression of disease  . TRANSTHORACIC ECHOCARDIOGRAM  02/17/2019   Mod LVH. GR 2 DD. Severe AS (mean gradient = 44 mmHg with Mod AI. mild Veronica.  . TUBAL LIGATION    . UPPER GI ENDOSCOPY     normal  . WISDOM TOOTH EXTRACTION      Past Medical Hx:  Past Medical History:  Diagnosis Date  . Anxiety and depression 08/26/2007   Qualifier: Diagnosis of  By: Glori Bickers MD, Carmell Austria   . Arthritis    knees  . Constipation, slow transit 11/03/2010  . Degenerative disk disease 08/31/2011  . Depression with anxiety   . DERMATITIS, SEBORRHEIC 08/25/2010   Qualifier: Diagnosis of  By: Glori Bickers MD, Carmell Austria   . Endometrial cancer [facility])    02/ 2019  Diagnosed with D&C/hysteroscopy with polypectomy  . Essential hypertension 08/29/2017  . Fibromyalgia    tx mobic  . Full dentures   . GERD 08/26/2007   Qualifier: Diagnosis of  By: Glori Bickers MD, Carmell Austria   . Gout   . Hearing loss    wears bilateral hearing aids  . History of colon polyps   . History of nonmelanoma skin cancer    right lower leg  . History of poliomyelitis 08/26/2007   Qualifier: History of  By: Glori Bickers MD, Carmell Austria   . Hyperlipidemia, mild 09/02/2012  . Hypertension   . Hypokalemia 01/02/2011  . IBS (irritable bowel syndrome)    with constipation  . Obesity 08/26/2007   Qualifier: Diagnosis of  By: Glori Bickers MD, Carmell Austria   . OSTEOARTHRITIS, HANDS, BILATERAL 08/26/2007   Qualifier: Diagnosis of  By: Glori Bickers MD, Carmell Austria   .  Osteopenia   . OVERACTIVE BLADDER 08/26/2007   Qualifier: Diagnosis of  By: Glori Bickers MD, Carmell Austria   . Personal history of colonic polyps 11/03/2010   05/2018 13 polyps mostly adenomas, ssp's - recall 1 year 2020 Gatha Mayer, MD, [facility]   . Poor balance 11/25/2018   With falls  Has rise in R shoe/post polio  Weak legs as well    . S/P TAVR (transcatheter aortic valve replacement)    s/p TAVR with a 10m Edwards S3U via the TF approach by Dr. BCyndia Bentand Dr. MAngelena Form . Severe calcific aortic stenosis 08/27/2017  . URINARY INCONTINENCE 08/26/2007   Qualifier: Diagnosis of  By: TGlori BickersMD, MCarmell Austria  . Wears hearing aid in both ears     Past Gynecological History:  Endometrial cancer. No LMP recorded. Patient has had a hysterectomy.  Family Hx:  Family History  Problem Relation Age of Onset  . Kidney disease Mother   . Alzheimer's disease Mother   . Stroke Father 758 . Colon cancer Father 737      possibly colon cancer, possibly just polyps  . Kidney disease Sister   . Heart attack Sister   . Colon cancer Paternal Aunt 717 . Cancer Paternal Uncle        unk type  . Esophageal cancer Neg Hx   . Stomach cancer Neg Hx     Review of Systems:  Constitutional  Feels well,    ENT Normal appearing ears and nares bilaterally Skin/Breast  No rash, sores, jaundice, itching, dryness Cardiovascular  No chest pain, shortness of breath, or edema  Pulmonary  No cough or wheeze.  Gastro Intestinal  No nausea, vomitting, or diarrhoea. No bright red blood per rectum, no change in bowel movement, or constipation.  Genito Urinary  No frequency, urgency, dysuria, no postmenopausal bleeding. Musculo Skeletal  No myalgia, arthralgia, joint swelling or pain  Neurologic  No weakness, numbness, change in gait,  Psychology  No depression, anxiety, insomnia.   Vitals:  Blood pressure (!) 154/53, pulse 66, height _0  (1.651 m), weight 176 lb 8 oz (80.1 kg), SpO2 100 %.  Physical Exam: WD in  NAD Neck  Supple NROM, without any enlargements.  Lymph Node Survey No cervical supraclavicular or inguinal adenopathy Cardiovascular  Pulse normal rate, regularity and rhythm.  Systolic murmur.  Lungs  Clear to auscultation bilateraly, without wheezes/crackles/rhonchi. Good air movement.  Skin  No rash/lesions/breakdown  Psychiatry  Alert and oriented to person, place, and time  Abdomen  Normoactive bowel sounds, abdomen soft, non-tender and obese without evidence of hernia. Well healed incisions.  Back No CVA tenderness Genito Urinary  External genitalia normal. Vaginal cuff healed, no lesions or masses visible or palpable.  Rectal  deferred Extremities  No bilateral cyanosis, clubbing or edema.  Thereasa Solo, MD  06/25/2019, 5:38 PM

## 2019-06-26 ENCOUNTER — Ambulatory Visit
Admission: RE | Admit: 2019-06-26 | Discharge: 2019-06-26 | Disposition: A | Discharge status: Home / Self Care | Payer: PPO | Source: Ambulatory Visit | Attending: Family Medicine | Admitting: Family Medicine

## 2019-06-26 DIAGNOSIS — Z1231 Encounter for screening mammogram for malignant neoplasm of breast: Secondary | ICD-10-CM

## 2019-07-16 ENCOUNTER — Other Ambulatory Visit: Payer: PPO | Admitting: Orthotics

## 2019-07-23 ENCOUNTER — Other Ambulatory Visit: Payer: Self-pay

## 2019-07-23 ENCOUNTER — Ambulatory Visit: Payer: PPO | Admitting: Orthotics

## 2019-07-23 DIAGNOSIS — M779 Enthesopathy, unspecified: Secondary | ICD-10-CM

## 2019-07-23 DIAGNOSIS — Q828 Other specified congenital malformations of skin: Secondary | ICD-10-CM

## 2019-07-23 DIAGNOSIS — M722 Plantar fascial fibromatosis: Secondary | ICD-10-CM

## 2019-07-23 DIAGNOSIS — M79672 Pain in left foot: Secondary | ICD-10-CM

## 2019-07-23 NOTE — Progress Notes (Signed)
Patient came in today to pick up custom made foot orthotics.  The goals were accomplished and the patient reported no dissatisfaction with said orthotics.  Patient was advised of breakin period and how to report any issues. 

## 2019-07-30 ENCOUNTER — Ambulatory Visit: Payer: PPO | Attending: Internal Medicine

## 2019-07-30 DIAGNOSIS — Z23 Encounter for immunization: Secondary | ICD-10-CM | POA: Insufficient documentation

## 2019-07-30 NOTE — Progress Notes (Signed)
   Covid-19 Vaccination Clinic  Name:  Josiphine Schwaiger    MRN: [id number] DOB: Apr 18, 1946  07/30/2019  Ms. Kinzey was observed post Covid-19 immunization for 15 minutes without incidence. She was provided with Vaccine Information Sheet and instruction to access the V-Safe system.   Ms. Berman was instructed to call 911 with any severe reactions post vaccine: Marland Kitchen Difficulty breathing  . Swelling of your face and throat  . A fast heartbeat  . A bad rash all over your body  . Dizziness and weakness    Immunizations Administered    Name Date Dose VIS Date Route   Pfizer COVID-19 Vaccine 07/30/2019  4:19 PM 0.3 mL 06/19/2019 Intramuscular   Manufacturer: [place]   Lot: [id number]   [place]: [id number]

## 2019-08-19 ENCOUNTER — Ambulatory Visit: Payer: PPO | Admitting: Podiatry

## 2019-08-19 ENCOUNTER — Encounter: Payer: Self-pay | Admitting: Podiatry

## 2019-08-19 ENCOUNTER — Other Ambulatory Visit: Payer: Self-pay

## 2019-08-19 DIAGNOSIS — M778 Other enthesopathies, not elsewhere classified: Secondary | ICD-10-CM | POA: Diagnosis not present

## 2019-08-19 NOTE — Progress Notes (Signed)
Subjective:   Patient ID: Veronica Greene, female   DOB: 74 y.o.   MRN: [id number]   HPI Patient states she is getting pain on top of her left foot and it did very well for several months and has come back   ROS      Objective:  Physical Exam  Neurovascular status intact with inflammation pain of the dorsal extensor complex left with inflammation associated with it localized     Assessment:  Extensor tendinitis left     Plan:  Sterile prep injected the extensor tendon complex 3 mg Kenalog 5 mg Xylocaine and went ahead discussed diclofenac cream and reappoint to recheck as symptoms indicate

## 2019-08-20 ENCOUNTER — Ambulatory Visit: Payer: PPO | Attending: Internal Medicine

## 2019-08-20 DIAGNOSIS — Z23 Encounter for immunization: Secondary | ICD-10-CM | POA: Insufficient documentation

## 2019-08-20 NOTE — Progress Notes (Signed)
   Covid-19 Vaccination Clinic  Name:  Veronica Greene    MRN: [id number] DOB: Jul 07, 1946  08/20/2019  [name] was observed post Covid-19 immunization for 15 minutes without incidence. She was provided with Vaccine Information Sheet and instruction to access the V-Safe system.   [name] was instructed to call 911 with any severe reactions post vaccine: Marland Kitchen Difficulty breathing  . Swelling of your face and throat  . A fast heartbeat  . A bad rash all over your body  . Dizziness and weakness    Immunizations Administered    Name Date Dose VIS Date Route   Pfizer COVID-19 Vaccine 08/20/2019  5:37 PM 0.3 mL 06/19/2019 Intramuscular   Manufacturer: [place]   Lot: [id number]   [place]: [id number]

## 2019-08-24 ENCOUNTER — Ambulatory Visit: Payer: Self-pay | Admitting: Radiation Oncology

## 2019-08-26 ENCOUNTER — Other Ambulatory Visit: Payer: Self-pay

## 2019-08-26 ENCOUNTER — Ambulatory Visit: Payer: PPO | Admitting: Cardiology

## 2019-08-26 ENCOUNTER — Encounter: Payer: Self-pay | Admitting: Cardiology

## 2019-08-26 VITALS — BP 132/54 | HR 70 | Temp 97.2°F | Ht 65.0 in | Wt 179.0 lb

## 2019-08-26 DIAGNOSIS — I10 Essential (primary) hypertension: Secondary | ICD-10-CM | POA: Diagnosis not present

## 2019-08-26 DIAGNOSIS — Z952 Presence of prosthetic heart valve: Secondary | ICD-10-CM | POA: Diagnosis not present

## 2019-08-26 DIAGNOSIS — E782 Mixed hyperlipidemia: Secondary | ICD-10-CM | POA: Diagnosis not present

## 2019-08-26 DIAGNOSIS — I350 Nonrheumatic aortic (valve) stenosis: Secondary | ICD-10-CM

## 2019-08-26 NOTE — Patient Instructions (Signed)
Medication Instructions:  NO CHANGES MAY STOP  PLAVIX PERIODICALLY 6 MONTH AFTER HAVING TAVR SURGERY - PLEASE HAVE PHYSICIAN TO SEND CLEARANCE INFORMATION TO OBTAIN OFFICIAL  AUTHORIZATION *If you need a refill on your cardiac medications before your next appointment, please call your pharmacy*  Lab Work: NOT NEEDED  Testing/Procedures: NOT NEEDED  Follow-Up: At [facility], you and your health needs are our priority.  As part of our continuing mission to provide you with exceptional heart care, we have created designated Provider Care Teams.  These Care Teams include your primary Cardiologist (physician) and Advanced Practice Providers (APPs -  Physician Assistants and Nurse Practitioners) who all work together to provide you with the care you need, when you need it.  Your next appointment:   12 month(s)- fEB 2022  The format for your next appointment:   In Person  Provider:   Glenetta Hew, MD  Other Instructions MAY STOP  PLAVIX PERIODICALLY 6 MONTH AFTER HAVING TAVR SURGERY - [place] TO SEND CLEARANCE INFORMATION TO [place]

## 2019-08-26 NOTE — Progress Notes (Signed)
Primary Care Provider: Abner Greenspan, MD Cardiologist: Glenetta Hew, MD Electrophysiologist: None  TAVR Cardiologist: Dr Burt Knack Angelena Form)  Clinic Note: Chief Complaint  Patient presents with  . Follow-up    3 months.  Post TAVR  . Aortic Stenosis    TAVR in October 2020    HPI:    Veronica Greene is a 74 y.o. female with a PMH below who presents today for routine follow-up after her TAVR last fall.Veronica Greene was last seen on by me prior to her preop/TAVR right and left heart catheterization.    May 21, 2019 for post TAVR follow-up by Angelena Form, PA.  Besides blood pressure being mildly elevated, she was doing well with no major issues.  Had one episode of feeling palpitations when she got very upset after her grandson had a seizure.  Her heart rate went up and her blood pressure went up.  Other than that she is was doing fine.  Has been out working in her yard and garden.  Definitely noted less dyspnea and fatigue since TAVR. ->  They discussed the importance of SBE prophylaxis.  Was plan to continue on aspirin and Plavix for 6 months which would be till 10/27/2019.    Was switched over to Protonix because of Plavix.  Was started on Norvasc 5 mg daily because of hypertension.  Asked to avoid Mobic while on DAPT  Follow-up echocardiogram ordered  Recent Hospitalizations: None since TAVR  Reviewed  CV studies:    The following studies were reviewed today: (if available, images/films reviewed: From Epic Chart or Care Everywhere)  04/09/2019-R&LHC: Severe AS, mean gradient 50 mmHg P-P 55 mmHg (estimated AVA 0.76 cm).  Mildly elevated pulmonary pressures with elevated LVEDP and PCWP.  Angiographically normal coronary arteries, but tortuous.  04/28/2019 TAVR with a20m Edwards Sapien 3 UltraTHV via the TF approach on 04/28/19. She had an uncomplicated hospital course and was discharged on POD1. She was discharged on aspirin and plavix.      04/29/2019-TTE (post TAVR): EF 60-65%.  GRII DD.  Moderate LA dilation with normal RA.  Moderate MAC.  No  AI (paravalvular leak-PVL), mean AV gradient 13 mmHg.  05/20/2020  - TTE: EF 60 to 65%.  Severely increased thickness/LVH.  GRII DD with only mildly elevated left atrial pressure.  Normal RA size.  Severe MAC with only trace MR.  No MS.  TAVR valve prosthesis functioning well.  Trivial AI.  Mean gradient 15 mmHg with a peak 29.8 mmHg.  Stable.  Interval History:   Veronica Marcelreturns here today overall doing fairly well.  Since that one episode of fast heart rate and blood pressure that she had shortly after her TAVR, she has not had any further palpitations.  She is not walking that much stating that she still has some balance issues, but she does pretty well with her orthotic shoe. Her major issue is that having switched from Nexium to Protonix, she is having much worse GERD.  She has a sleep somewhat sitting up.  She has some gagging sensations as well.  Would like to switch back to Nexium (can do so once off of clopidogrel)  She has not had any chest pain or pressure with rest or exertion.  No signs or symptoms of arrhythmias or heart failure.  CV Review of Symptoms (Summary): no chest pain or dyspnea on exertion positive for - edema negative for - irregular heartbeat, orthopnea, palpitations, paroxysmal nocturnal dyspnea,  rapid heart rate, shortness of breath or Syncope/near syncope, TIA/amaurosis fugax  The patient does not have symptoms concerning for COVID-19 infection (fever, chills, cough, or new shortness of breath).  The patient is practicing social distancing & Masking.    REVIEWED OF SYSTEMS   Review of Systems  Constitutional: Negative for malaise/fatigue and weight loss.  HENT: Negative for nosebleeds.   Cardiovascular:       Since that one spell of increased heart rate and blood pressure in the setting of a stressful situation, not having any further  symptoms.  Gastrointestinal: Negative for blood in stool and melena.  Genitourinary: Negative for hematuria.  Musculoskeletal: Positive for joint pain.       But she says that her legs feel much better now.  Neurological: Negative for dizziness and headaches.  Psychiatric/Behavioral: Negative for depression.    I have reviewed and (if needed) personally updated the patient's problem list, medications, allergies, past medical and surgical history, social and family history.   PAST MEDICAL HISTORY   Past Medical History:  Diagnosis Date  . Anxiety and depression 08/26/2007   Qualifier: Diagnosis of  By: Glori Bickers MD, Carmell Austria   . Arthritis    knees  . Constipation, slow transit 11/03/2010  . Degenerative disk disease 08/31/2011  . Depression with anxiety   . DERMATITIS, SEBORRHEIC 08/25/2010   Qualifier: Diagnosis of  By: Glori Bickers MD, Carmell Austria   . Endometrial cancer [facility])    02/ 2019  Diagnosed with D&C/hysteroscopy with polypectomy  . Essential hypertension 08/29/2017  . Fibromyalgia    tx mobic  . Full dentures   . GERD 08/26/2007   Qualifier: Diagnosis of  By: Glori Bickers MD, Carmell Austria   . Gout   . Hearing loss    wears bilateral hearing aids  . History of colon polyps   . History of nonmelanoma skin cancer    right lower leg  . History of poliomyelitis 08/26/2007   Qualifier: History of  By: Glori Bickers MD, Carmell Austria   . Hyperlipidemia, mild 09/02/2012  . Hypertension   . Hypokalemia 01/02/2011  . IBS (irritable bowel syndrome)    with constipation  . Obesity 08/26/2007   Qualifier: Diagnosis of  By: Glori Bickers MD, Carmell Austria   . OSTEOARTHRITIS, HANDS, BILATERAL 08/26/2007   Qualifier: Diagnosis of  By: Glori Bickers MD, Carmell Austria   . Osteopenia   . OVERACTIVE BLADDER 08/26/2007   Qualifier: Diagnosis of  By: Glori Bickers MD, Carmell Austria   . Personal history of colonic polyps 11/03/2010   05/2018 13 polyps mostly adenomas, ssp's - recall 1 year 2020 Gatha Mayer, MD, [facility]   . Poor balance 11/25/2018   With  falls  Has rise in R shoe/post polio  Weak legs as well    . S/P TAVR (transcatheter aortic valve replacement)    s/p TAVR with a 18m Edwards S3U via the TF approach by Dr. BCyndia Bentand Dr. MAngelena Form . Severe calcific aortic stenosis 08/27/2017    -> Became severe as of October 2020-referred for TAVR on 04/28/2019  . URINARY INCONTINENCE 08/26/2007   Qualifier: Diagnosis of  By: TGlori BickersMD, MCarmell Austria  . Wears hearing aid in both ears     PAST SURGICAL HISTORY   Past Surgical History:  Procedure Laterality Date  . ANKLE SURGERY Right 1956  . CARPAL TUNNEL RELEASE Bilateral 1986, 1993  . COLONOSCOPY N/A 06/24/2015   Procedure: COLONOSCOPY;  Surgeon: CGatha Mayer MD;  Location: [facility];  Service: Endoscopy;  Laterality: N/A;  . DILATATION & CURETTAGE/HYSTEROSCOPY WITH MYOSURE N/A 08/09/2017   Procedure: DILATATION & CURETTAGE/HYSTEROSCOPY WITH MYOSURE;  Surgeon: Christophe Louis, MD;  Location: [facility];  Service: Gynecology;  Laterality: N/A;  Polypectomy  . DILATION AND CURETTAGE OF UTERUS     PMB  . HAND SURGERY Bilateral 1999   Thumbs   . KNEE SURGERY Bilateral 1998   x2  . LYMPH NODE BIOPSY N/A 10/08/2017   Procedure: LYMPH NODE BIOPSY;  Surgeon: [name], MD;  Location: [facility];  Service: Gynecology;  Laterality: N/A;  . MULTIPLE TOOTH EXTRACTIONS    . NASAL SINUS SURGERY  1976  . polio surgery.     right foot - right leg 1.5" shorter than left leg - some wekness  . RIGHT/LEFT HEART CATH AND CORONARY ANGIOGRAPHY N/A 04/09/2019   Procedure: RIGHT/LEFT HEART CATH AND CORONARY ANGIOGRAPHY;  Surgeon: Leonie Man, MD;  Location: [facility];  Service: Cardiovascular;; Severe AS, mean gradient 50 mmHg P-P 55 mmHg (estimated AVA 0.76 cm).  Mildly elevated pulmonary pressures with elevated LVEDP and PCWP.  Angiographically normal coronary arteries, but tortuous.  . ROBOTIC ASSISTED TOTAL HYSTERECTOMY WITH BILATERAL SALPINGO OOPHERECTOMY Bilateral 10/08/2017   Procedure: XI ROBOTIC  ASSISTED TOTAL HYSTERECTOMY WITH BILATERAL SALPINGO OOPHORECTOMY;  Surgeon: [name], MD;  Location: [facility];  Service: Gynecology;  Laterality: Bilateral;  . TEE WITHOUT CARDIOVERSION N/A 04/28/2019   Procedure: TRANSESOPHAGEAL ECHOCARDIOGRAM (TEE);  Surgeon: Burnell Blanks, MD;  Location: [place] [facility];  Service: Open Heart Surgery;  Laterality: N/A;  . THUMB ARTHROSCOPY  1999  . TONSILLECTOMY  3027   74 years old  . TRANSCATHETER AORTIC VALVE REPLACEMENT, TRANSFEMORAL N/A 04/28/2019   Procedure: TRANSCATHETER AORTIC VALVE REPLACEMENT, TRANSFEMORAL;  Surgeon: Burnell Blanks, MD;  Location: [place] [facility]; ; 23 mm Sapien 3 Ultra THV via TF approach. (post-op mean AV gradient 13 mmHg)  . TRANSTHORACIC ECHOCARDIOGRAM  1/'19-2/'20   a) mild LVH, ef 60-65%, grade 2 DD, Mod AoV thickening /mild calcified w/ Mod AS/Mild AR (valve area 1.15cm^2, mean gradiant 46mHg, peak grandient 472mg)/ mild LAE and RAE/ trivial TR; b) 08/2018: Mod LVH. Mod-Severe AS (mild AI) - mean gradient now 38 mmHg. Mild-Mod MAC. No MS.  --notable progression of disease  . TRANSTHORACIC ECHOCARDIOGRAM  02/17/2019   Mod LVH. GR 2 DD. Severe AS (mean gradient = 44 mmHg with Mod AI. mild MS.  . Marland KitchenRANSTHORACIC ECHOCARDIOGRAM  04/29/2019   a)  (post TAVR): EF 60-65%.  GRII DD.  Mod LA dilation & nl RA.  Mod MAC.  No  AI (paravalvular leak-PVL), mean AV gradient 13 mmHg.;; b) EF 60 to 65%.  Severely increased thickness/LVH.  GRII DD w/ only mildly elevated LAP.  NL RA size.  Severe MAC with only trace MR.  No MS.  TAVR valve prosthesis functioning well.  Trivial AI.  Mean gradient 15 mmHg with a peak 29.8 mmHg. -- STABLE  . TUBAL LIGATION    . UPPER GI ENDOSCOPY     normal  . WISDOM TOOTH EXTRACTION      MEDICATIONS/ALLERGIES   Current Meds  Medication Sig  . acetaminophen (TYLENOL) 325 MG tablet Take 650 mg by mouth every 6 (six) hours as needed (for pain.).  . Marland Kitchencetic acid-hydrocortisone (ACETASOL  HC) OTIC solution Place 2 drops into both ears 2 (two) times daily as needed (itching).  . Marland KitchenmLODipine (NORVASC) 5 MG tablet Take 1 tablet (5 mg total) by mouth  daily.  . Ascorbic Acid (VITAMIN C) 1000 MG tablet Take 1,000 mg by mouth daily with breakfast.   . aspirin EC 81 MG tablet Take 81 mg by mouth daily.  . Black Pepper-Turmeric (TURMERIC COMPLEX/BLACK PEPPER PO) Take 15 mLs by mouth daily with breakfast.   . Calcium-Phosphorus-Vitamin D (CALCIUM/VITAMIN D3/ADULT GUMMY PO) Take 1 tablet by mouth 2 (two) times daily.   . cetirizine (ZYRTEC) 10 MG tablet Take 5 mg by mouth as needed for allergies.   . cholecalciferol (VITAMIN D) 1000 UNITS tablet Take 1,000 Units by mouth daily with breakfast.   . clopidogrel (PLAVIX) 75 MG tablet Take 1 tablet (75 mg total) by mouth daily with breakfast.  . fluticasone (FLONASE) 50 MCG/ACT nasal spray Place 2 sprays into both nostrils at bedtime.   . GUAIFENESIN 1200 PO Take 1,200 mg by mouth at bedtime.   . hydrochlorothiazide (HYDRODIURIL) 25 MG tablet Take 1 tablet (25 mg total) by mouth daily with lunch.  . lubiprostone (AMITIZA) 24 MCG capsule Take 1 capsule (24 mcg total) by mouth 2 (two) times daily. LUNCH & SUPPER  . metoprolol succinate (TOPROL XL) 25 MG 24 hr tablet Take 1 tablet (25 mg total) by mouth daily.  . Multiple Vitamin (MULTIVITAMIN WITH MINERALS) TABS tablet Take 1 tablet by mouth daily.   . pantoprazole (PROTONIX) 40 MG tablet Take 1 tablet (40 mg total) by mouth daily.  Vladimir Faster Glycol-Propyl Glycol (SYSTANE) 0.4-0.3 % SOLN Place 1-2 drops into both eyes 2 (two) times daily.   . potassium chloride (K-DUR) 10 MEQ tablet Take 2 tablets (20 mEq total) by mouth 2 (two) times daily.  . Simethicone (PHAZYME PO) Take 1 tablet by mouth at bedtime as needed (for gas).   . sodium chloride (AYR) 0.65 % nasal spray Place 1 spray into the nose daily.   Marland Kitchen venlafaxine XR (EFFEXOR XR) 75 MG 24 hr capsule Take 1 capsule (75 mg total) by mouth daily  after breakfast.    Allergies  Allergen Reactions  . Clarithromycin Swelling and Other (See Comments)    Throat Swells  . Penicillins Hives and Other (See Comments)    WELTS Has patient had a PCN reaction causing immediate rash, facial/tongue/throat swelling, SOB or lightheadedness with hypotension: No Has patient had a PCN reaction causing severe rash involving mucus membranes or skin necrosis: Unknown Has patient had a PCN reaction that required hospitalization:No Has patient had a PCN reaction occurring within the last 10 years: No If all of the above answers are "NO", then may proceed with Cephalosporin use.   . Codeine Nausea And Vomiting    SOCIAL HISTORY/FAMILY HISTORY   Reviewed in Epic:  Pertinent findings: No new changes.  OBJCTIVE -PE, EKG, labs   Wt Readings from Last 3 Encounters:  08/26/19 179 lb (81.2 kg)  06/25/19 176 lb 8 oz (80.1 kg)  05/21/19 176 lb (79.8 kg)    Physical Exam: BP (!) 132/54 (BP Location: Left Arm, Patient Position: Sitting, Cuff Size: Normal)   Pulse 70   Temp (!) 97.2 F (36.2 C)   Ht _0  (1.651 m)   Wt 179 lb (81.2 kg)   BMI 29.79 kg/m  Physical Exam  Constitutional: She is oriented to person, place, and time. She appears well-developed and well-nourished. No distress.  Still has audible murmur from her aortic valve.  HENT:  Head: Normocephalic and atraumatic.  Cardiovascular: Normal rate, regular rhythm, S1 normal, S2 normal and intact distal pulses.  Occasional extrasystoles are present.  PMI is not displaced. Exam reveals no gallop.  Murmur heard.  Harsh crescendo-decrescendo midsystolic murmur is present with a grade of 2/6 at the upper right sternal border radiating to the neck. Murmur is noticeably less prominent than last visit Pulmonary/Chest: Effort normal and breath sounds normal. No respiratory distress. She has no wheezes. She has no rales.  Abdominal: Soft. Bowel sounds are normal. There is no abdominal tenderness.  There is no rebound.  Musculoskeletal:        General: Deformity (Post polio, right leg shorter than left.  Has orthotic shoe-essentially slightly smaller same style shoe she is wearing on the left foot, but also has a thickened sole to allow for equal leg length) present. Normal range of motion.  Neurological: She is alert and oriented to person, place, and time.  Psychiatric: She has a normal mood and affect. Her behavior is normal. Judgment and thought content normal.  Vitals reviewed.    Adult ECG Report  Rate: 81 ;  Rhythm: normal sinus rhythm and 1 degree AVB.  Otherwise normal axis, intervals and durations.  Narrative Interpretation: Stable EKG.  Recent Labs:    Lab Results  Component Value Date   CHOL 181 11/20/2018   HDL 46.90 11/20/2018   LDLCALC 105 (H) 11/20/2018   TRIG 147.0 11/20/2018   CHOLHDL 4 11/20/2018   Lab Results  Component Value Date   CREATININE 0.57 04/29/2019   BUN 8 04/29/2019   NA 139 04/29/2019   K 3.4 (L) 04/29/2019   CL 106 04/29/2019   CO2 22 04/29/2019   Lab Results  Component Value Date   TSH 2.34 11/20/2018    ASSESSMENT/PLAN    Problem List Items Addressed This Visit    Severe calcific aortic stenosis (Chronic)    She became symptomatic over the course of 2019-2020, and her gradients became more more severe.  Was referred for TAVR after right and left heart cath.  Underwent TAVR in October 2020, doing well since then.  On aspirin and Plavix post TAVR (until October 27, 2018) Is on stable dose of Toprol and amlodipine as well as HCTZ.  No heart failure symptoms well.  No syncope or near syncope      HLD (hyperlipidemia) (Chronic)    LDL was 105 as of last May.  With her aortic valve disease, I would like to get her lipids a little better controlled.  As per my last note, would suggest that if her lipids did not show a reduction with LDL less than 100, would strongly consider starting at least moderate dose statin such as 10-20  rosuvastatin.  (With her being on amlodipine, would avoid simvastatin or atorvastatin)      Essential hypertension (Chronic)    Blood pressure actually looks pretty good today having had amlodipine added last visit.  She is now on amlodipine along with Toprol and HCTZ.  Has not really noticed any significant edema.  Continue current meds for now.  We do have room to titrate existing meds some if necessary.      Relevant Orders   EKG 12-Lead (Completed)   S/P TAVR (transcatheter aortic valve replacement) - Primary    Stable TAVR valve with stable gradients and normal function.  No significant paravalvular leak.  Plan: Continue aspirin plus Plavix until October 27, 2019 (have asked that she try to get her GI procedure delayed until after that timeframe in order for [place] to avoid having to hold Plavix prematurely).  As of April 20, we  can simply stop Plavix and stay on aspirin alone.  We will follow up with TAVR clinic likely 1 more year and then continue with me.  Probably will have echocardiogram done roughly October November 2021      Relevant Orders   EKG 12-Lead (Completed)    Other Visit Diagnoses    Severe aortic stenosis       Relevant Orders   EKG 12-Lead (Completed)       COVID-19 Education: The signs and symptoms of COVID-19 were discussed with the patient and how to seek care for testing (follow up with PCP or arrange E-visit).   The importance of social distancing was discussed today.  I spent a total of 65mnutes with the patient. >  50% of the time was spent in direct patient consultation.  Additional time spent with chart review  / charting (studies, outside notes, etc):12 Total Time: 30 min   Current medicines are reviewed at length with the patient today.  (+/- concerns) n/a   Patient Instructions / Medication Changes & Studies & Tests Ordered   Patient Instructions  Medication Instructions:  NO CHANGES MAY STOP  PLAVIX PERIODICALLY 6 MONTH AFTER HAVING  TAVR SURGERY - PLEASE HAVE PHYSICIAN TO SEND CLEARANCE INFORMATION TO OBTAIN OFFICIAL  AUTHORIZATION *If you need a refill on your cardiac medications before your next appointment, please call your pharmacy*  Lab Work: NOT NEEDED  Testing/Procedures: NOT NEEDED  Follow-Up: At [facility] you and your health needs are our priority.  As part of our continuing mission to provide you with exceptional heart care, we have created designated Provider Care Teams.  These Care Teams include your primary Cardiologist (physician) and Advanced Practice Providers (APPs -  Physician Assistants and Nurse Practitioners) who all work together to provide you with the care you need, when you need it.  Your next appointment:   12 month(s)- fEB 2022  The format for your next appointment:   In Person  Provider:   DGlenetta Hew MD  Other Instructions MAY STOP  PLAVIX PERIODICALLY 6 MONTH AFTER HAVING TAVR SURGERY - PLEASE HAVE PHYSICIAN TO SEND CLEARANCE INFORMATION TO OBTAIN OFFICIAL  AUTHORIZATION    Studies Ordered:   Orders Placed This Encounter  Procedures  . EKG 12-Lead     DGlenetta Hew M.D., M.S. Interventional Cardiologist   Pager # [phone] # [phone] [address] [place]  [address]  Thank you for choosing [facility]!

## 2019-08-28 ENCOUNTER — Ambulatory Visit: Payer: PPO | Admitting: Internal Medicine

## 2019-08-28 ENCOUNTER — Encounter: Payer: Self-pay | Admitting: Cardiology

## 2019-08-29 ENCOUNTER — Encounter: Payer: Self-pay | Admitting: Cardiology

## 2019-08-29 NOTE — Assessment & Plan Note (Signed)
LDL was 105 as of last May.  With her aortic valve disease, I would like to get her lipids a little better controlled.  As per my last note, would suggest that if her lipids did not show a reduction with LDL less than 100, would strongly consider starting at least moderate dose statin such as 10-20 rosuvastatin.  (With her being on amlodipine, would avoid simvastatin or atorvastatin)

## 2019-08-29 NOTE — Assessment & Plan Note (Signed)
Stable TAVR valve with stable gradients and normal function.  No significant paravalvular leak.  Plan: Continue aspirin plus Plavix until October 27, 2019 (have asked that Veronica Greene try to get her GI procedure delayed until after that timeframe in order for [place] to avoid having to hold Plavix prematurely).  As of April 20, we can simply stop Plavix and stay on aspirin alone.  We will follow up with TAVR clinic likely 1 more year and then continue with me.  Probably will have echocardiogram done roughly October November 2021

## 2019-08-29 NOTE — Assessment & Plan Note (Signed)
Blood pressure actually looks pretty good today having had amlodipine added last visit.  She is now on amlodipine along with Toprol and HCTZ.  Has not really noticed any significant edema.  Continue current meds for now.  We do have room to titrate existing meds some if necessary.

## 2019-08-29 NOTE — Assessment & Plan Note (Signed)
She became symptomatic over the course of 2019-2020, and her gradients became more more severe.  Was referred for TAVR after right and left heart cath.  Underwent TAVR in October 2020, doing well since then.  On aspirin and Plavix post TAVR (until October 27, 2018) Is on stable dose of Toprol and amlodipine as well as HCTZ.  No heart failure symptoms well.  No syncope or near syncope

## 2019-09-09 ENCOUNTER — Telehealth: Payer: Self-pay | Admitting: Cardiology

## 2019-09-09 NOTE — Telephone Encounter (Signed)
New Message  Patient is calling in to speak with Dr. Ellyn Hack or Angelena Form about having her colonoscopy. Patient states she was told by Dr. Ellyn Hack to call back in to discuss when would be an appropriate time to go off of the blood thinner. Please give patient a call back to assist.

## 2019-09-10 NOTE — Telephone Encounter (Signed)
Contacted patient- she states that she has not decided yet if she will do the colonoscopy- because she also needs to have eye surgery, I advised that either way they would need to send [place] a clearance for surgery and everything would be decided on her medication that way-  Patient verbalized understanding, was thankful for the call.

## 2019-09-11 ENCOUNTER — Telehealth: Payer: Self-pay | Admitting: Podiatry

## 2019-09-11 NOTE — Telephone Encounter (Signed)
Left message informing pt that if she would like she could come to the office and be fitted with a stabilizing boot, and ice 3-4 times daily for 15 minutes/sessions to assist with pain management and swelling.

## 2019-09-11 NOTE — Telephone Encounter (Signed)
Pt stated that she is having lft foot pain would like advice on what to do until appt on 09/18/19

## 2019-09-13 NOTE — Telephone Encounter (Signed)
Basically when I saw her last time, the recommendation was that she stay on her aspirin Plavix until mid April I believe it is April 20.  That is clearly stated in my note.  That is a recommendation based on her TAVR.  I mention this to her when I saw her in clinic back in February as did Nell Range, [place] at her follow-up visit.  The recommendation is 6 months of aspirin Plavix following TAVR.  This would go for both procedures.  Glenetta Hew, MD

## 2019-09-18 ENCOUNTER — Ambulatory Visit: Payer: PPO | Admitting: Podiatry

## 2019-09-20 NOTE — Progress Notes (Addendum)
Radiation Oncology         [phone] ________________________________  Name: Veronica Greene MRN: [id number]  Date: 09/21/2019  DOB: 01-23-1946  Follow-Up Visit Note  CC: Tower, [name], MD  [name], MD    ICD-10-CM   1. Endometrial cancer (HCC) Chronic C54.1     Diagnosis: Stage IB Grade 1 Endometrial Cancer  Interval Since Last Radiation: One year, nine months, one week, and three days.  Radiation treatment dates:  Brachytherapy: 11/20/2017, 11/27/2017, 12/05/2017, 12/09/2017, 12/11/2017  Site/dose:  Vaginal cuff / 6 Gy in 5 fractions for a total dose of 30 Gy  Narrative: The patient returns today for routine follow-up. Of note, she underwent a right and left heart cath and coronary angiography on 04/09/2019 performed by Dr. Ellyn Hack. She then underwent a transcatheter aortic valve replacement and transfemoral transesophageal echocardiogram on 04/27/2020 performed by Dr. Cyndia Bent.  On 06/26/2019, the patient underwent a bilateral screening mammogram that did not show any evidence of malignancy.  She last followed up with Dr. Denman George on 06/25/2019, during which time the patient was doing well. Dr. Denman George recommended continued follow-ups in three month intervals.  On review of systems, the patient reports bilateral knee pain that is arthritic in nature, vaginal odor that coincides with urine odor, and abdominal bloating related to food. The patient denies vaginal discharge/bleeding, vaginal itching, rectal bleeding, nausea, and vomiting.                        ALLERGIES:  is allergic to clarithromycin; penicillins; and codeine.  Meds: Current Outpatient Medications  Medication Sig Dispense Refill  . acetaminophen (TYLENOL) 325 MG tablet Take 650 mg by mouth every 6 (six) hours as needed (for pain.).    Marland Kitchen acetic acid-hydrocortisone (ACETASOL HC) OTIC solution Place 2 drops into both ears 2 (two) times daily as needed (itching).    Marland Kitchen amLODipine (NORVASC) 5 MG tablet Take 1 tablet  (5 mg total) by mouth daily. 90 tablet 3  . Ascorbic Acid (VITAMIN C) 1000 MG tablet Take 1,000 mg by mouth daily with breakfast.     . aspirin EC 81 MG tablet Take 81 mg by mouth daily.    . Black Pepper-Turmeric (TURMERIC COMPLEX/BLACK PEPPER PO) Take 15 mLs by mouth daily with breakfast.     . Calcium-Phosphorus-Vitamin D (CALCIUM/VITAMIN D3/ADULT GUMMY PO) Take 1 tablet by mouth 2 (two) times daily.     . cetirizine (ZYRTEC) 10 MG tablet Take 5 mg by mouth as needed for allergies.     . cholecalciferol (VITAMIN D) 1000 UNITS tablet Take 1,000 Units by mouth daily with breakfast.     . clopidogrel (PLAVIX) 75 MG tablet Take 1 tablet (75 mg total) by mouth daily with breakfast. 90 tablet 1  . fluticasone (FLONASE) 50 MCG/ACT nasal spray Place 2 sprays into both nostrils at bedtime.     . GUAIFENESIN 1200 PO Take 1,200 mg by mouth at bedtime.     . hydrochlorothiazide (HYDRODIURIL) 25 MG tablet Take 1 tablet (25 mg total) by mouth daily with lunch. 90 tablet 3  . lubiprostone (AMITIZA) 24 MCG capsule Take 1 capsule (24 mcg total) by mouth 2 (two) times daily. LUNCH & SUPPER 180 capsule 3  . metoprolol succinate (TOPROL XL) 25 MG 24 hr tablet Take 1 tablet (25 mg total) by mouth daily. 90 tablet 3  . Multiple Vitamin (MULTIVITAMIN WITH MINERALS) TABS tablet Take 1 tablet by mouth daily.     Marland Kitchen  pantoprazole (PROTONIX) 40 MG tablet Take 1 tablet (40 mg total) by mouth daily. 90 tablet 3  . Polyethyl Glycol-Propyl Glycol (SYSTANE) 0.4-0.3 % SOLN Place 1-2 drops into both eyes 2 (two) times daily.     . potassium chloride (K-DUR) 10 MEQ tablet Take 2 tablets (20 mEq total) by mouth 2 (two) times daily. 360 tablet 3  . Simethicone (PHAZYME PO) Take 1 tablet by mouth at bedtime as needed (for gas).     . sodium chloride (AYR) 0.65 % nasal spray Place 1 spray into the nose daily.     Marland Kitchen venlafaxine XR (EFFEXOR XR) 75 MG 24 hr capsule Take 1 capsule (75 mg total) by mouth daily after breakfast. 90 capsule 3    No current facility-administered medications for this encounter.    Physical Findings: The patient is in no acute distress. Patient is alert and oriented.  weight is 180 lb 12.8 oz (82 kg). Her temperature is 98.5 F (36.9 C). Her blood pressure is 160/74 (abnormal) and her pulse is 67. Her respiration is 20 and oxygen saturation is 98%.   Lungs are clear to auscultation bilaterally.  Heart has regular rhythm and rate. No palpable cervical, supraclavicular, or axillary adenopathy. Abdomen soft, non-tender, normal bowel sounds.  On pelvic examination the external genitalia were unremarkable. A speculum exam was performed. There are no mucosal lesions noted in the vaginal vault. On bimanual and rectovaginal examination there were no pelvic masses appreciated. Vaginal cuff intact.   Lab Findings: Lab Results  Component Value Date   WBC 6.1 04/29/2019   HGB 10.7 (L) 04/29/2019   HCT 31.1 (L) 04/29/2019   MCV 90.9 04/29/2019   PLT 169 04/29/2019    Radiographic Findings: No results found.  Impression:  Stage IB Grade 1 Endometrial Cancer.  No evidence of recurrence on clinical exam.  Plan:  Follow up with Dr. Denman George in 3 months. Follow up in radiation oncology in 9 months since she will be 2 years out from her radiation treatment at that time.  Urine will be obtained for culture and analysis in light of her symptoms as above.   ____________________________________  Blair Promise, PhD, MD  This document serves as a record of services personally performed by Gery Pray, MD. It was created on his behalf by Clerance Lav, a trained medical scribe. The creation of this record is based on the scribe's personal observations and the provider's statements to them. This document has been checked and approved by the attending provider.

## 2019-09-21 ENCOUNTER — Other Ambulatory Visit: Payer: Self-pay

## 2019-09-21 ENCOUNTER — Ambulatory Visit
Admission: RE | Admit: 2019-09-21 | Discharge: 2019-09-21 | Disposition: A | Discharge status: Home / Self Care | Payer: PPO | Source: Ambulatory Visit | Attending: Radiation Oncology | Admitting: Radiation Oncology

## 2019-09-21 ENCOUNTER — Encounter: Payer: Self-pay | Admitting: Radiation Oncology

## 2019-09-21 DIAGNOSIS — Z7982 Long term (current) use of aspirin: Secondary | ICD-10-CM | POA: Insufficient documentation

## 2019-09-21 DIAGNOSIS — R140 Abdominal distension (gaseous): Secondary | ICD-10-CM | POA: Diagnosis not present

## 2019-09-21 DIAGNOSIS — Z79899 Other long term (current) drug therapy: Secondary | ICD-10-CM | POA: Diagnosis not present

## 2019-09-21 DIAGNOSIS — Z923 Personal history of irradiation: Secondary | ICD-10-CM | POA: Insufficient documentation

## 2019-09-21 DIAGNOSIS — C541 Malignant neoplasm of endometrium: Secondary | ICD-10-CM | POA: Insufficient documentation

## 2019-09-21 DIAGNOSIS — M25561 Pain in right knee: Secondary | ICD-10-CM | POA: Insufficient documentation

## 2019-09-21 DIAGNOSIS — Z8542 Personal history of malignant neoplasm of other parts of uterus: Secondary | ICD-10-CM | POA: Insufficient documentation

## 2019-09-21 DIAGNOSIS — M25562 Pain in left knee: Secondary | ICD-10-CM | POA: Diagnosis not present

## 2019-09-21 DIAGNOSIS — Z08 Encounter for follow-up examination after completed treatment for malignant neoplasm: Secondary | ICD-10-CM | POA: Diagnosis not present

## 2019-09-21 DIAGNOSIS — R300 Dysuria: Secondary | ICD-10-CM

## 2019-09-21 LAB — URINALYSIS, COMPLETE (UACMP) WITH MICROSCOPIC
Bacteria, UA: NONE SEEN
Bilirubin Urine: NEGATIVE
Glucose, UA: NEGATIVE mg/dL
Hgb urine dipstick: NEGATIVE
Ketones, ur: NEGATIVE mg/dL
Leukocytes,Ua: NEGATIVE
Nitrite: NEGATIVE
Protein, ur: NEGATIVE mg/dL
Specific Gravity, Urine: 1.006 (ref 1.005–1.030)
pH: 6 (ref 5.0–8.0)

## 2019-09-21 NOTE — Patient Instructions (Signed)
Coronavirus (COVID-19) Are you at risk?  Are you at risk for the Coronavirus (COVID-19)?  To be considered HIGH RISK for Coronavirus (COVID-19), you have to meet the following criteria:  . Traveled to [place], [place], [place], [place] or [place]; or in the [place] to [place], [place], [place], or [place]; and have fever, cough, and shortness of breath within the last 2 weeks of travel OR . Been in close contact with a person diagnosed with COVID-19 within the last 2 weeks and have fever, cough, and shortness of breath . IF YOU DO NOT MEET THESE CRITERIA, YOU ARE CONSIDERED LOW RISK FOR COVID-19.  What to do if you are HIGH RISK for COVID-19?  . If you are having a medical emergency, call 911. . Seek medical care right away. Before you go to a doctor's office, urgent care or emergency department, call ahead and tell them about your recent travel, contact with someone diagnosed with COVID-19, and your symptoms. You should receive instructions from your physician's office regarding next steps of care.  . When you arrive at healthcare provider, tell the healthcare staff immediately you have returned from visiting [place], [place], [place], [place] or [place]; or traveled in the [place] to [place], [place], [place], or [place]; in the last two weeks or you have been in close contact with a person diagnosed with COVID-19 in the last 2 weeks.   . Tell the health care staff about your symptoms: fever, cough and shortness of breath. . After you have been seen by a medical provider, you will be either: o Tested for (COVID-19) and discharged home on quarantine except to seek medical care if symptoms worsen, and asked to  - Stay home and avoid contact with others until you get your results (4-5 days)  - Avoid travel on public transportation if possible (such as bus, train, or airplane) or o Sent to the Emergency Department by EMS for evaluation, COVID-19 testing, and possible  admission depending on your condition and test results.  What to do if you are LOW RISK for COVID-19?  Reduce your risk of any infection by using the same precautions used for avoiding the common cold or flu:  . Wash your hands often with soap and warm water for at least 20 seconds.  If soap and water are not readily available, use an alcohol-based hand sanitizer with at least 60% alcohol.  . If coughing or sneezing, cover your mouth and nose by coughing or sneezing into the elbow areas of your shirt or coat, into a tissue or into your sleeve (not your hands). . Avoid shaking hands with others and consider head nods or verbal greetings only. . Avoid touching your eyes, nose, or mouth with unwashed hands.  . Avoid close contact with people who are sick. . Avoid places or events with large numbers of people in one location, like concerts or sporting events. . Carefully consider travel plans you have or are making. . If you are planning any travel outside or inside the [place], visit the CDC's Travelers' Health webpage for the latest health notices. . If you have some symptoms but not all symptoms, continue to monitor at home and seek medical attention if your symptoms worsen. . If you are having a medical emergency, call 911.   ADDITIONAL HEALTHCARE OPTIONS FOR PATIENTS  [facility] / e-Visit: [email or web address]         [facility]: [phone]  [facility]: [phone]                   [facility]: [phone]   

## 2019-09-21 NOTE — Progress Notes (Signed)
Veronica Greene presents today for f/u with Dr. Sondra Come. Pt reports bilateral knee pain, arthritic in nature, rates 6/10. Pt denies dysuria/hematuria. Pt reports frequent strong smelling urine, pt is unsure if its from "sweating". Pt denies vaginal bleeding/discharge. Pt reports vaginal odor that coincides with urine odor. Pt denies vaginal itching. Pt denies rectal bleeding. Pt reports normal bowel habits. Pt reports abdominal bloating. Denies N/V.  BP (!) 160/74 (BP Location: Left Arm, Patient Position: Sitting, Cuff Size: Normal)   Pulse 67   Temp 98.5 F (36.9 C)   Resp 20   Wt 180 lb 12.8 oz (82 kg)   SpO2 98%   BMI 30.09 kg/m   Wt Readings from Last 3 Encounters:  09/21/19 180 lb 12.8 oz (82 kg)  08/26/19 179 lb (81.2 kg)  06/25/19 176 lb 8 oz (80.1 kg)   Loma Sousa, RN BSN

## 2019-09-21 NOTE — Addendum Note (Signed)
Encounter addended by: Gery Pray, MD on: 09/21/2019 4:00 PM  Actions taken: Clinical Note Signed

## 2019-09-22 ENCOUNTER — Telehealth: Payer: Self-pay

## 2019-09-22 LAB — URINE CULTURE: Culture: NO GROWTH

## 2019-09-22 NOTE — Telephone Encounter (Signed)
Left VM for pt that urine labs were WNL. This RN's direct number left on VM for any additional questions/concerns. Loma Sousa, RN BSN

## 2019-10-14 ENCOUNTER — Other Ambulatory Visit: Payer: Self-pay | Admitting: *Deleted

## 2019-10-14 MED ORDER — LUBIPROSTONE 24 MCG PO CAPS: 24.0000 ug | ORAL_CAPSULE | Freq: Two times a day (BID) | ORAL | 0 refills | Status: DC

## 2019-10-15 ENCOUNTER — Ambulatory Visit: Payer: PPO | Admitting: Internal Medicine

## 2019-10-15 ENCOUNTER — Other Ambulatory Visit (INDEPENDENT_AMBULATORY_CARE_PROVIDER_SITE_OTHER): Payer: PPO

## 2019-10-15 ENCOUNTER — Encounter: Payer: Self-pay | Admitting: Internal Medicine

## 2019-10-15 VITALS — BP 140/70 | HR 68 | Temp 98.2°F | Ht 64.25 in | Wt 179.5 lb

## 2019-10-15 DIAGNOSIS — K219 Gastro-esophageal reflux disease without esophagitis: Secondary | ICD-10-CM

## 2019-10-15 DIAGNOSIS — D649 Anemia, unspecified: Secondary | ICD-10-CM | POA: Diagnosis not present

## 2019-10-15 DIAGNOSIS — Z952 Presence of prosthetic heart valve: Secondary | ICD-10-CM | POA: Diagnosis not present

## 2019-10-15 DIAGNOSIS — Z86010 Personal history of colonic polyps: Secondary | ICD-10-CM | POA: Diagnosis not present

## 2019-10-15 LAB — CBC WITH DIFFERENTIAL/PLATELET
Basophils Absolute: 0 10*3/uL (ref 0.0–0.1)
Basophils Relative: 0.3 % (ref 0.0–3.0)
Eosinophils Absolute: 0.1 10*3/uL (ref 0.0–0.7)
Eosinophils Relative: 2.2 % (ref 0.0–5.0)
HCT: 36.5 % (ref 36.0–46.0)
Hemoglobin: 12.8 g/dL (ref 12.0–15.0)
Lymphocytes Relative: 24.5 % (ref 12.0–46.0)
Lymphs Abs: 1.4 10*3/uL (ref 0.7–4.0)
MCHC: 35 g/dL (ref 30.0–36.0)
MCV: 89.2 fl (ref 78.0–100.0)
Monocytes Absolute: 0.3 10*3/uL (ref 0.1–1.0)
Monocytes Relative: 5.1 % (ref 3.0–12.0)
Neutro Abs: 3.8 10*3/uL (ref 1.4–7.7)
Neutrophils Relative %: 67.9 % (ref 43.0–77.0)
Platelets: 197 10*3/uL (ref 150.0–400.0)
RBC: 4.09 Mil/uL (ref 3.87–5.11)
RDW: 14 % (ref 11.5–15.5)
WBC: 5.6 10*3/uL (ref 4.0–10.5)

## 2019-10-15 MED ORDER — NA SULFATE-K SULFATE-MG SULF 17.5-3.13-1.6 GM/177ML PO SOLN: 1.0000 | Freq: Once | ORAL | 0 refills | Status: AC

## 2019-10-15 NOTE — Progress Notes (Signed)
Veronica Greene [place] 74 y.o. Dec 04, 1945 [id number]  Assessment & Plan:   Encounter Diagnoses  Name Primary?  . Personal history of colonic polyps Yes  . Gastroesophageal reflux disease, unspecified whether esophagitis present   . Mild anemia   . S/P TAVR (transcatheter aortic valve replacement)    She has a history of numerous adenomas as well as a large adenoma in the past.  She is a bit overdue for her 1 year surveillance but between Covid and her needing a TAVR this is understandable.  She is stopping her Plavix around April 13 and we will schedule a colonoscopy at an appropriate time after that.  She plans to return to her Nexium as Protonix has not controlled her reflux well.  Double prep as we did in 2019  The risks and benefits as well as alternatives of endoscopic procedure(s) have been discussed and reviewed. All questions answered. The patient agrees to proceed.  CBC to recheck Hgb - last was 10 .9 in 04/2019   I appreciate the opportunity to care for her Cc:Tower, [name], MD  Subjective:   Chief Complaint: History of colon polyps  HPI And is here for follow-up, she was due for a repeat colonoscopy due to numerous diminutive adenomas as well as some SSP's, last year around November but this got delayed as she had a TAVR procedure.  She feels well after that and is due to stop Plavix on April 13.  Not having GI symptoms.  Also notes she is due to have cataract surgery early see the ophthalmologist about that. She reminds me she needed a double prep last colonoscopy.  CBC Latest Ref Rng & Units 04/29/2019 04/28/2019 04/28/2019  WBC 4.0 - 10.5 K/uL 6.1 - -  Hemoglobin 12.0 - 15.0 g/dL 10.7(L) 9.2(L) 9.5(L)  Hematocrit 36.0 - 46.0 % 31.1(L) 27.0(L) 28.0(L)  Platelets 150 - 400 K/uL 169 - -    Allergies  Allergen Reactions  . Clarithromycin Swelling and Other (See Comments)    Throat Swells  . Penicillins Hives and Other (See Comments)    WELTS Has patient had  a PCN reaction causing immediate rash, facial/tongue/throat swelling, SOB or lightheadedness with hypotension: No Has patient had a PCN reaction causing severe rash involving mucus membranes or skin necrosis: Unknown Has patient had a PCN reaction that required hospitalization:No Has patient had a PCN reaction occurring within the last 10 years: No If all of the above answers are "NO", then may proceed with Cephalosporin use.   . Codeine Nausea And Vomiting   Current Meds  Medication Sig  . acetaminophen (TYLENOL) 325 MG tablet Take 650 mg by mouth every 6 (six) hours as needed (for pain.).  Marland Kitchen acetic acid-hydrocortisone (ACETASOL HC) OTIC solution Place 2 drops into both ears 2 (two) times daily as needed (itching).  Marland Kitchen amLODipine (NORVASC) 5 MG tablet Take 1 tablet (5 mg total) by mouth daily.  . Ascorbic Acid (VITAMIN C) 1000 MG tablet Take 1,000 mg by mouth daily with breakfast.   . aspirin EC 81 MG tablet Take 81 mg by mouth daily.  . Black Pepper-Turmeric (TURMERIC COMPLEX/BLACK PEPPER PO) Take 15 mLs by mouth daily with breakfast.   . Calcium-Phosphorus-Vitamin D (CALCIUM/VITAMIN D3/ADULT GUMMY PO) Take 1 tablet by mouth 2 (two) times daily.   . cetirizine (ZYRTEC) 10 MG tablet Take 5 mg by mouth as needed for allergies.   . cholecalciferol (VITAMIN D) 1000 UNITS tablet Take 1,000 Units by mouth daily with breakfast.   .  clopidogrel (PLAVIX) 75 MG tablet Take 1 tablet (75 mg total) by mouth daily with breakfast.  . fluticasone (FLONASE) 50 MCG/ACT nasal spray Place 2 sprays into both nostrils at bedtime.   . GUAIFENESIN 1200 PO Take 1,200 mg by mouth at bedtime.   . hydrochlorothiazide (HYDRODIURIL) 25 MG tablet Take 1 tablet (25 mg total) by mouth daily with lunch.  . lubiprostone (AMITIZA) 24 MCG capsule Take 1 capsule (24 mcg total) by mouth 2 (two) times daily. LUNCH & SUPPER  . metoprolol succinate (TOPROL XL) 25 MG 24 hr tablet Take 1 tablet (25 mg total) by mouth daily.  .  Multiple Vitamin (MULTIVITAMIN WITH MINERALS) TABS tablet Take 1 tablet by mouth daily.   . pantoprazole (PROTONIX) 40 MG tablet Take 1 tablet (40 mg total) by mouth daily.  Vladimir Faster Glycol-Propyl Glycol (SYSTANE) 0.4-0.3 % SOLN Place 1-2 drops into both eyes 2 (two) times daily.   . potassium chloride (K-DUR) 10 MEQ tablet Take 2 tablets (20 mEq total) by mouth 2 (two) times daily.  . Simethicone (PHAZYME PO) Take 1 tablet by mouth at bedtime as needed (for gas).   . sodium chloride (AYR) 0.65 % nasal spray Place 1 spray into the nose daily.   Marland Kitchen venlafaxine XR (EFFEXOR XR) 75 MG 24 hr capsule Take 1 capsule (75 mg total) by mouth daily after breakfast.   Past Medical History:  Diagnosis Date  . Anxiety and depression 08/26/2007   Qualifier: Diagnosis of  By: Glori Bickers MD, Carmell Austria   . Arthritis    knees  . Constipation, slow transit 11/03/2010  . Degenerative disk disease 08/31/2011  . Depression with anxiety   . DERMATITIS, SEBORRHEIC 08/25/2010   Qualifier: Diagnosis of  By: Glori Bickers MD, Carmell Austria   . Endometrial cancer [facility])    02/ 2019  Diagnosed with D&C/hysteroscopy with polypectomy  . Essential hypertension 08/29/2017  . Fibromyalgia    tx mobic  . Full dentures   . GERD 08/26/2007   Qualifier: Diagnosis of  By: Glori Bickers MD, Carmell Austria   . Gout   . Hearing loss    wears bilateral hearing aids  . History of colon polyps   . History of nonmelanoma skin cancer    right lower leg  . History of poliomyelitis 08/26/2007   Qualifier: History of  By: Glori Bickers MD, Carmell Austria   . Hyperlipidemia, mild 09/02/2012  . Hypertension   . Hypokalemia 01/02/2011  . IBS (irritable bowel syndrome)    with constipation  . Obesity 08/26/2007   Qualifier: Diagnosis of  By: Glori Bickers MD, Carmell Austria   . OSTEOARTHRITIS, HANDS, BILATERAL 08/26/2007   Qualifier: Diagnosis of  By: Glori Bickers MD, Carmell Austria   . Osteopenia   . OVERACTIVE BLADDER 08/26/2007   Qualifier: Diagnosis of  By: Glori Bickers MD, Carmell Austria   . Personal history  of colonic polyps 11/03/2010   05/2018 13 polyps mostly adenomas, ssp's - recall 1 year 2020 Gatha Mayer, MD, [facility]   . Poor balance 11/25/2018   With falls  Has rise in R shoe/post polio  Weak legs as well    . S/P TAVR (transcatheter aortic valve replacement)    s/p TAVR with a 80m Edwards S3U via the TF approach by Dr. BCyndia Bentand Dr. MAngelena Form . Severe calcific aortic stenosis 08/27/2017    -> Became severe as of October 2020-referred for TAVR on 04/28/2019  . URINARY INCONTINENCE 08/26/2007   Qualifier: Diagnosis of  By: TGlori BickersMD, MRoque Lias  Tarry   . Wears hearing aid in both ears    Past Surgical History:  Procedure Laterality Date  . ANKLE SURGERY Right 1956  . CARPAL TUNNEL RELEASE Bilateral 1986, 1993  . COLONOSCOPY N/A 06/24/2015   Procedure: COLONOSCOPY;  Surgeon: Gatha Mayer, MD;  Location: [facility];  Service: Endoscopy;  Laterality: N/A;  . DILATATION & CURETTAGE/HYSTEROSCOPY WITH MYOSURE N/A 08/09/2017   Procedure: DILATATION & CURETTAGE/HYSTEROSCOPY WITH MYOSURE;  Surgeon: Christophe Louis, MD;  Location: [facility];  Service: Gynecology;  Laterality: N/A;  Polypectomy  . DILATION AND CURETTAGE OF UTERUS     PMB  . HAND SURGERY Bilateral 1999   Thumbs   . KNEE SURGERY Bilateral 1998   x2  . LYMPH NODE BIOPSY N/A 10/08/2017   Procedure: LYMPH NODE BIOPSY;  Surgeon: [name], MD;  Location: [facility];  Service: Gynecology;  Laterality: N/A;  . MULTIPLE TOOTH EXTRACTIONS    . NASAL SINUS SURGERY  1976  . polio surgery.     right foot - right leg 1.5" shorter than left leg - some wekness  . RIGHT/LEFT HEART CATH AND CORONARY ANGIOGRAPHY N/A 04/09/2019   Procedure: RIGHT/LEFT HEART CATH AND CORONARY ANGIOGRAPHY;  Surgeon: Leonie Man, MD;  Location: [place] [facility];  Service: Cardiovascular;; Severe AS, mean gradient 50 mmHg P-P 55 mmHg (estimated AVA 0.76 cm).  Mildly elevated pulmonary pressures with elevated LVEDP and PCWP.  Angiographically normal coronary arteries, but  tortuous.  . ROBOTIC ASSISTED TOTAL HYSTERECTOMY WITH BILATERAL SALPINGO OOPHERECTOMY Bilateral 10/08/2017   Procedure: XI ROBOTIC ASSISTED TOTAL HYSTERECTOMY WITH BILATERAL SALPINGO OOPHORECTOMY;  Surgeon: [name], MD;  Location: [facility];  Service: Gynecology;  Laterality: Bilateral;  . TEE WITHOUT CARDIOVERSION N/A 04/28/2019   Procedure: TRANSESOPHAGEAL ECHOCARDIOGRAM (TEE);  Surgeon: Burnell Blanks, MD;  Location: [place] [facility];  Service: Open Heart Surgery;  Laterality: N/A;  . THUMB ARTHROSCOPY  1999  . TONSILLECTOMY  61100   75 years old  . TRANSCATHETER AORTIC VALVE REPLACEMENT, TRANSFEMORAL N/A 04/28/2019   Procedure: TRANSCATHETER AORTIC VALVE REPLACEMENT, TRANSFEMORAL;  Surgeon: Burnell Blanks, MD;  Location: [place] [facility]; ; 23 mm Sapien 3 Ultra THV via TF approach. (post-op mean AV gradient 13 mmHg)  . TRANSTHORACIC ECHOCARDIOGRAM  1/'19-2/'20   a) mild LVH, ef 60-65%, grade 2 DD, Mod AoV thickening /mild calcified w/ Mod AS/Mild AR (valve area 1.15cm^2, mean gradiant 94mHg, peak grandient 461mg)/ mild LAE and RAE/ trivial TR; b) 08/2018: Mod LVH. Mod-Severe AS (mild AI) - mean gradient now 38 mmHg. Mild-Mod MAC. No MS.  --notable progression of disease  . TRANSTHORACIC ECHOCARDIOGRAM  02/17/2019   Mod LVH. GR 2 DD. Severe AS (mean gradient = 44 mmHg with Mod AI. mild MS.  . Marland KitchenRANSTHORACIC ECHOCARDIOGRAM  04/29/2019   a)  (post TAVR): EF 60-65%.  GRII DD.  Mod LA dilation & nl RA.  Mod MAC.  No  AI (paravalvular leak-PVL), mean AV gradient 13 mmHg.;; b) EF 60 to 65%.  Severely increased thickness/LVH.  GRII DD w/ only mildly elevated LAP.  NL RA size.  Severe MAC with only trace MR.  No MS.  TAVR valve prosthesis functioning well.  Trivial AI.  Mean gradient 15 mmHg with a peak 29.8 mmHg. -- STABLE  . TUBAL LIGATION    . UPPER GI ENDOSCOPY     normal  . WISDOM TOOTH EXTRACTION     Social History   Social History Narrative   She is relatively  recently  remarried.  She has 2  children and 3 grandchildren.     She is currently retired.  But enjoys cleaning house and doing chores.  She likes to do yard work.  Does not routinely exercise.   family history includes Alzheimer's disease in her mother; Cancer in her paternal uncle; Colon cancer (age of onset: 65) in her father and paternal aunt; Heart attack in her sister; Kidney disease in her mother and sister; Stroke (age of onset: 91) in her father.   Review of Systems As above  Objective:   Physical Exam BP 140/70 (BP Location: Left Arm, Patient Position: Sitting, Cuff Size: Normal)   Pulse 68   Temp 98.2 F (36.8 C)   Ht 5' 4.25" (1.632 m) Comment: height measured without shoes  Wt 179 lb 8 oz (81.4 kg)   BMI 30.57 kg/m  NAD Lungs cta Cor s1s2 sl sys murmur

## 2019-10-15 NOTE — Patient Instructions (Signed)
You have been scheduled for a colonoscopy. Please follow written instructions given to you at your visit today.  Please pick up your prep supplies at the pharmacy within the next 1-3 days. If you use inhalers (even only as needed), please bring them with you on the day of your procedure. Stay on your Asprin.    Your provider has requested that you go to the basement level for lab work before leaving today. Press "B" on the elevator. The lab is located at the first door on the left as you exit the elevator.   Due to recent changes in healthcare laws, you may see the results of your imaging and laboratory studies on MyChart before your provider has had a chance to review them.  We understand that in some cases there may be results that are confusing or concerning to you. Not all laboratory results come back in the same time frame and the provider may be waiting for multiple results in order to interpret others.  Please give [place] 48 hours in order for your provider to thoroughly review all the results before contacting the office for clarification of your results.   I appreciate the opportunity to care for you. Silvano Rusk, MD, [facility]

## 2019-10-20 DIAGNOSIS — H18413 Arcus senilis, bilateral: Secondary | ICD-10-CM | POA: Diagnosis not present

## 2019-10-20 DIAGNOSIS — H2511 Age-related nuclear cataract, right eye: Secondary | ICD-10-CM | POA: Diagnosis not present

## 2019-10-20 DIAGNOSIS — H25013 Cortical age-related cataract, bilateral: Secondary | ICD-10-CM | POA: Diagnosis not present

## 2019-10-20 DIAGNOSIS — H25043 Posterior subcapsular polar age-related cataract, bilateral: Secondary | ICD-10-CM | POA: Diagnosis not present

## 2019-10-20 DIAGNOSIS — H35372 Puckering of macula, left eye: Secondary | ICD-10-CM | POA: Diagnosis not present

## 2019-10-20 DIAGNOSIS — H2513 Age-related nuclear cataract, bilateral: Secondary | ICD-10-CM | POA: Diagnosis not present

## 2019-10-26 DIAGNOSIS — L570 Actinic keratosis: Secondary | ICD-10-CM | POA: Diagnosis not present

## 2019-10-26 DIAGNOSIS — Z85828 Personal history of other malignant neoplasm of skin: Secondary | ICD-10-CM | POA: Diagnosis not present

## 2019-10-26 DIAGNOSIS — D1801 Hemangioma of skin and subcutaneous tissue: Secondary | ICD-10-CM | POA: Diagnosis not present

## 2019-10-26 DIAGNOSIS — L814 Other melanin hyperpigmentation: Secondary | ICD-10-CM | POA: Diagnosis not present

## 2019-10-26 DIAGNOSIS — L218 Other seborrheic dermatitis: Secondary | ICD-10-CM | POA: Diagnosis not present

## 2019-10-26 DIAGNOSIS — L821 Other seborrheic keratosis: Secondary | ICD-10-CM | POA: Diagnosis not present

## 2019-10-26 DIAGNOSIS — D225 Melanocytic nevi of trunk: Secondary | ICD-10-CM | POA: Diagnosis not present

## 2019-10-26 DIAGNOSIS — L905 Scar conditions and fibrosis of skin: Secondary | ICD-10-CM | POA: Diagnosis not present

## 2019-10-28 ENCOUNTER — Encounter: Payer: Self-pay | Admitting: Internal Medicine

## 2019-11-10 ENCOUNTER — Encounter: Payer: Self-pay | Admitting: Internal Medicine

## 2019-11-10 ENCOUNTER — Ambulatory Visit (AMBULATORY_SURGERY_CENTER): Payer: PPO | Admitting: Internal Medicine

## 2019-11-10 ENCOUNTER — Other Ambulatory Visit: Payer: Self-pay

## 2019-11-10 VITALS — BP 125/79 | HR 61 | Temp 96.8°F | Resp 19 | Ht 64.0 in | Wt 179.0 lb

## 2019-11-10 DIAGNOSIS — Z86010 Personal history of colonic polyps: Secondary | ICD-10-CM | POA: Diagnosis not present

## 2019-11-10 DIAGNOSIS — D125 Benign neoplasm of sigmoid colon: Secondary | ICD-10-CM | POA: Diagnosis not present

## 2019-11-10 DIAGNOSIS — D123 Benign neoplasm of transverse colon: Secondary | ICD-10-CM | POA: Diagnosis not present

## 2019-11-10 DIAGNOSIS — Z1211 Encounter for screening for malignant neoplasm of colon: Secondary | ICD-10-CM | POA: Diagnosis not present

## 2019-11-10 MED ORDER — SODIUM CHLORIDE 0.9 % IV SOLN: 500.0000 mL | Freq: Once | INTRAVENOUS | Status: DC

## 2019-11-10 NOTE — Progress Notes (Signed)
To PACU, VSS. Report to [email or web address] 

## 2019-11-10 NOTE — Progress Notes (Signed)
Called to room to assist during endoscopic procedure.  Patient ID and intended procedure confirmed with present staff. Received instructions for my participation in the procedure from the performing physician.  

## 2019-11-10 NOTE — Progress Notes (Signed)
[place]

## 2019-11-10 NOTE — Progress Notes (Signed)
History reviewed today 

## 2019-11-10 NOTE — Patient Instructions (Addendum)
I found and removed 4 tiny polyps today.  I will let you know pathology results and when to have another routine colonoscopy by mail and/or My Chart.  I appreciate the opportunity to care for you. Gatha Mayer, MD, FACG    YOU HAD AN ENDOSCOPIC PROCEDURE TODAY AT [facility]:   Refer to the procedure report that was given to you for any specific questions about what was found during the examination.  If the procedure report does not answer your questions, please call your gastroenterologist to clarify.  If you requested that your care partner not be given the details of your procedure findings, then the procedure report has been included in a sealed envelope for you to review at your convenience later.  YOU SHOULD EXPECT: Some feelings of bloating in the abdomen. Passage of more gas than usual.  Walking can help get rid of the air that was put into your GI tract during the procedure and reduce the bloating. If you had a lower endoscopy (such as a colonoscopy or flexible sigmoidoscopy) you may notice spotting of blood in your stool or on the toilet paper. If you underwent a bowel prep for your procedure, you may not have a normal bowel movement for a few days.  Please Note:  You might notice some irritation and congestion in your nose or some drainage.  This is from the oxygen used during your procedure.  There is no need for concern and it should clear up in a day or so.  SYMPTOMS TO REPORT IMMEDIATELY:   Following lower endoscopy (colonoscopy or flexible sigmoidoscopy):  Excessive amounts of blood in the stool  Significant tenderness or worsening of abdominal pains  Swelling of the abdomen that is new, acute  Fever of 100F or higher   For urgent or emergent issues, a gastroenterologist can be reached at any hour by calling [phone]. Do not use MyChart messaging for urgent concerns.    DIET:  We do recommend a small meal at first, but then you may proceed to  your regular diet.  Drink plenty of fluids but you should avoid alcoholic beverages for 24 hours.  ACTIVITY:  You should plan to take it easy for the rest of today and you should NOT DRIVE or use heavy machinery until tomorrow (because of the sedation medicines used during the test).    FOLLOW UP: Our staff will call the number listed on your records 48-72 hours following your procedure to check on you and address any questions or concerns that you may have regarding the information given to you following your procedure. If we do not reach you, we will leave a message.  We will attempt to reach you two times.  During this call, we will ask if you have developed any symptoms of COVID 19. If you develop any symptoms (ie: fever, flu-like symptoms, shortness of breath, cough etc.) before then, please call [phone].  If you test positive for Covid 19 in the 2 weeks post procedure, please call and report this information to [place].    If any biopsies were taken you will be contacted by phone or by letter within the next 1-3 weeks.  Please call us at [phone] if you have not heard about the biopsies in 3 weeks.    SIGNATURES/CONFIDENTIALITY: You and/or your care partner have signed paperwork which will be entered into your electronic medical record.  These signatures attest to the fact that that the information above  on your After Visit Summary has been reviewed and is understood.  Full responsibility of the confidentiality of this discharge information lies with you and/or your care-partner.   Resume medications. Information given on polyps.

## 2019-11-10 NOTE — Op Note (Signed)
[facility] Patient Name: Veronica Greene Procedure Date: 11/10/2019 10:17 AM MRN: [id number] Endoscopist: Gatha Mayer , MD Age: 74 Referring MD:  Date of Birth: [birth year] Gender: Female Account #: [id number] Procedure:                Colonoscopy Indications:              Surveillance: History of numerous (> 10) adenomas                            on last colonoscopy (< 3 yrs) Medicines:                Propofol per Anesthesia, Monitored Anesthesia Care Procedure:                Pre-Anesthesia Assessment:                           - Prior to the procedure, a History and Physical                            was performed, and patient medications and                            allergies were reviewed. The patient's tolerance of                            previous anesthesia was also reviewed. The risks                            and benefits of the procedure and the sedation                            options and risks were discussed with the patient.                            All questions were answered, and informed consent                            was obtained. Prior Anticoagulants: The patient has                            taken no previous anticoagulant or antiplatelet                            agents. ASA Grade Assessment: III - A patient with                            severe systemic disease. After reviewing the risks                            and benefits, the patient was deemed in                            satisfactory condition to undergo the procedure.  After obtaining informed consent, the colonoscope                            was passed under direct vision. Throughout the                            procedure, the patient's blood pressure, pulse, and                            oxygen saturations were monitored continuously. The                            Colonoscope was introduced through the anus and   advanced to the the cecum, identified by                            appendiceal orifice and ileocecal valve. The                            colonoscopy was performed without difficulty. The                            patient tolerated the procedure well. The quality                            of the bowel preparation was good. The bowel                            preparation used was Miralax and SUPREP via split                            dose instruction. Scope In: 10:35:34 AM Scope Out: 10:53:45 AM Scope Withdrawal Time: 0 hours 10 minutes 14 seconds  Total Procedure Duration: 0 hours 18 minutes 11 seconds  Findings:                 The perianal and digital rectal examinations were                            normal.                           Four sessile polyps were found in the sigmoid colon                            and transverse colon. The polyps were diminutive in                            size. These polyps were removed with a cold snare.                            Resection and retrieval were complete. Verification                            of patient identification for the  specimen was                            done. Estimated blood loss was minimal.                           The exam was otherwise without abnormality on                            direct and retroflexion views. Complications:            No immediate complications. Estimated Blood Loss:     Estimated blood loss was minimal. Impression:               - Four diminutive polyps in the sigmoid colon and                            in the transverse colon, removed with a cold snare.                            Resected and retrieved.                           - The examination was otherwise normal on direct                            and retroflexion views. There was a tattoo in                            ascending colon.                           - Personal history of colonic polyps. Numerous                             adenomas. Recommendation:           - Patient has a contact number available for                            emergencies. The signs and symptoms of potential                            delayed complications were discussed with the                            patient. Return to normal activities tomorrow.                            Written discharge instructions were provided to the                            patient.                           - Resume previous diet.                           -  Continue present medications.                           - Repeat colonoscopy is recommended for                            surveillance. The colonoscopy date will be                            determined after pathology results from today's                            exam become available for review. She takes a                            double prep Gatha Mayer, MD 11/10/2019 11:07:51 AM This report has been signed electronically.

## 2019-11-12 ENCOUNTER — Encounter: Payer: Self-pay | Admitting: Internal Medicine

## 2019-11-12 ENCOUNTER — Telehealth: Payer: Self-pay | Admitting: *Deleted

## 2019-11-12 DIAGNOSIS — Z86010 Personal history of colonic polyps: Secondary | ICD-10-CM

## 2019-11-12 NOTE — Telephone Encounter (Signed)
No answer for post procedure call back. Will call back again this afternoon.

## 2019-11-12 NOTE — Telephone Encounter (Signed)
Patient returned call is doing well except she said her stomach is sore since yesterday

## 2019-11-12 NOTE — Telephone Encounter (Signed)
  Follow up Call-  Call back number 11/10/2019 05/20/2018  Post procedure Call Back phone  # [phone]  Permission to leave phone message Yes Yes  Some recent data might be hidden     Patient questions:  Do you have a fever, pain , or abdominal swelling? Yes.   Pain Score  2 *  Have you tolerated food without any problems? Yes.    Have you been able to return to your normal activities? Yes.    Do you have any questions about your discharge instructions: Diet   No. Medications  no Follow up visit  No.  Do you have questions or concerns about your Care? No. PT c/o stomach being sore 2/10 but this is better than yesterday. Encouraged her to walk and drink some warm fluids as this could still be some "air " she needs to pass. Instructed her to let us know if her abdominal pain worsens or she has swelling or bleeding.  Actions: * If pain score is 4 or above: No action needed, pain <4.

## 2019-11-16 DIAGNOSIS — H2511 Age-related nuclear cataract, right eye: Secondary | ICD-10-CM | POA: Diagnosis not present

## 2019-11-16 HISTORY — PX: CATARACT EXTRACTION: SUR2

## 2019-11-17 DIAGNOSIS — H2512 Age-related nuclear cataract, left eye: Secondary | ICD-10-CM | POA: Diagnosis not present

## 2019-11-18 ENCOUNTER — Telehealth: Payer: Self-pay | Admitting: Family Medicine

## 2019-11-18 DIAGNOSIS — I10 Essential (primary) hypertension: Secondary | ICD-10-CM

## 2019-11-18 DIAGNOSIS — E782 Mixed hyperlipidemia: Secondary | ICD-10-CM

## 2019-11-18 NOTE — Telephone Encounter (Signed)
-----   Message from Ellamae Sia sent at 11/17/2019  2:33 PM EDT ----- Regarding: Lab orders for Friday, 5.14.21 Patient is scheduled for CPX labs, please order future labs, Thanks , Karna Christmas

## 2019-11-20 ENCOUNTER — Other Ambulatory Visit: Payer: Self-pay

## 2019-11-20 ENCOUNTER — Other Ambulatory Visit (INDEPENDENT_AMBULATORY_CARE_PROVIDER_SITE_OTHER): Payer: PPO

## 2019-11-20 DIAGNOSIS — E782 Mixed hyperlipidemia: Secondary | ICD-10-CM | POA: Diagnosis not present

## 2019-11-20 DIAGNOSIS — I10 Essential (primary) hypertension: Secondary | ICD-10-CM

## 2019-11-20 LAB — CBC WITH DIFFERENTIAL/PLATELET
Basophils Absolute: 0 10*3/uL (ref 0.0–0.1)
Basophils Relative: 0.3 % (ref 0.0–3.0)
Eosinophils Absolute: 0.2 10*3/uL (ref 0.0–0.7)
Eosinophils Relative: 3.4 % (ref 0.0–5.0)
HCT: 36.3 % (ref 36.0–46.0)
Hemoglobin: 12.7 g/dL (ref 12.0–15.0)
Lymphocytes Relative: 25.1 % (ref 12.0–46.0)
Lymphs Abs: 1.1 10*3/uL (ref 0.7–4.0)
MCHC: 35 g/dL (ref 30.0–36.0)
MCV: 89.7 fl (ref 78.0–100.0)
Monocytes Absolute: 0.2 10*3/uL (ref 0.1–1.0)
Monocytes Relative: 5.4 % (ref 3.0–12.0)
Neutro Abs: 2.9 10*3/uL (ref 1.4–7.7)
Neutrophils Relative %: 65.8 % (ref 43.0–77.0)
Platelets: 185 10*3/uL (ref 150.0–400.0)
RBC: 4.05 Mil/uL (ref 3.87–5.11)
RDW: 13.3 % (ref 11.5–15.5)
WBC: 4.4 10*3/uL (ref 4.0–10.5)

## 2019-11-20 LAB — COMPREHENSIVE METABOLIC PANEL
ALT: 15 U/L (ref 0–35)
AST: 19 U/L (ref 0–37)
Albumin: 4.5 g/dL (ref 3.5–5.2)
Alkaline Phosphatase: 56 U/L (ref 39–117)
BUN: 20 mg/dL (ref 6–23)
CO2: 29 mEq/L (ref 19–32)
Calcium: 9.3 mg/dL (ref 8.4–10.5)
Chloride: 102 mEq/L (ref 96–112)
Creatinine, Ser: 0.65 mg/dL (ref 0.40–1.20)
GFR: 88.92 mL/min (ref >60.00)
Glucose, Bld: 99 mg/dL (ref 70–99)
Potassium: 3.9 mEq/L (ref 3.5–5.1)
Sodium: 138 mEq/L (ref 135–145)
Total Bilirubin: 0.8 mg/dL (ref 0.2–1.2)
Total Protein: 6.9 g/dL (ref 6.0–8.3)

## 2019-11-20 LAB — LIPID PANEL
Cholesterol: 178 mg/dL (ref 0–200)
HDL: 46 mg/dL (ref >39.00)
LDL Cholesterol: 111 mg/dL — ABNORMAL HIGH (ref 0–99)
NonHDL: 132.14
Total CHOL/HDL Ratio: 4
Triglycerides: 107 mg/dL (ref 0.0–149.0)
VLDL: 21.4 mg/dL (ref 0.0–40.0)

## 2019-11-20 LAB — TSH: TSH: 1.98 u[IU]/mL (ref 0.35–4.50)

## 2019-11-24 ENCOUNTER — Ambulatory Visit (INDEPENDENT_AMBULATORY_CARE_PROVIDER_SITE_OTHER): Payer: PPO

## 2019-11-24 ENCOUNTER — Other Ambulatory Visit: Payer: PPO

## 2019-11-24 ENCOUNTER — Ambulatory Visit: Payer: PPO

## 2019-11-24 DIAGNOSIS — Z0000 Encounter for general adult medical examination without abnormal findings: Secondary | ICD-10-CM | POA: Diagnosis not present

## 2019-11-24 NOTE — Progress Notes (Signed)
Subjective:   Veronica Greene is a 74 y.o. female who presents for Medicare Annual (Subsequent) preventive examination.  Review of Systems: N/A   I connected with the patient today by telephone and verified that I am speaking with the correct person using two identifiers. Location patient: home Location nurse: work Persons participating in the virtual visit: patient, Marine scientist.   I discussed the limitations, risks, security and privacy concerns of performing an evaluation and management service by telephone and the availability of in person appointments. I also discussed with the patient that there may be a patient responsible charge related to this service. The patient expressed understanding and verbally consented to this telephonic visit.    Interactive audio and video telecommunications were attempted between this nurse and patient, however failed, due to patient having technical difficulties OR patient did not have access to video capability.  We continued and completed visit with audio only.     Cardiac Risk Factors include: advanced age (>18mn, >>74women);hypertension;dyslipidemia     Objective:     Vitals: There were no vitals taken for this visit.  There is no height or weight on file to calculate BMI.  Advanced Directives 11/24/2019 09/21/2019 06/25/2019 04/28/2019 04/24/2019 04/22/2019 04/09/2019  Does Patient Have a Medical Advance Directive? _0  Yes Yes  Type of AParamedicof AFaywoodLiving will H[place]Living will - HHollisLiving will HPaxicoLiving will HCamuyLiving will HSt. MaryLiving will  Does patient want to make changes to medical advance directive? - - - No - Patient declined No - Patient declined - No - Patient declined  Copy of HBuena Vistain Chart? No - copy requested No - copy requested - No - copy  requested No - copy requested No - copy requested No - copy requested    Tobacco Social History   Tobacco Use  Smoking Status Never Smoker  Smokeless Tobacco Never Used     Counseling given: Not Answered   Clinical Intake:  Pre-visit preparation completed: Yes  Pain : No/denies pain     Nutritional Risks: Nausea/ vomitting/ diarrhea(diarrhea at times) Diabetes: No  How often do you need to have someone help you when you read instructions, pamphlets, or other written materials from your doctor or pharmacy?: 1 - Never  Interpreter Needed?: No  Information entered by :: CJohnson, LPN  Past Medical History:  Diagnosis Date  . Anxiety and depression 08/26/2007   Qualifier: Diagnosis of  By: TGlori BickersMD, MCarmell Austria  . Arthritis    knees  . Constipation, slow transit 11/03/2010  . Degenerative disk disease 08/31/2011  . Depression with anxiety   . DERMATITIS, SEBORRHEIC 08/25/2010   Qualifier: Diagnosis of  By: TGlori BickersMD, MCarmell Austria  . Endometrial cancer ([facility]    02/ 2019  Diagnosed with D&C/hysteroscopy with polypectomy  . Essential hypertension 08/29/2017  . Fibromyalgia    tx mobic  . Full dentures   . GERD 08/26/2007   Qualifier: Diagnosis of  By: TGlori BickersMD, MCarmell Austria  . Gout   . Hearing loss    wears bilateral hearing aids  . History of colon polyps   . History of nonmelanoma skin cancer    right lower leg  . History of poliomyelitis 08/26/2007   Qualifier: History of  By: TGlori BickersMD, MCarmell Austria  . Hyperlipidemia, mild 09/02/2012  . Hypertension   .  Hypokalemia 01/02/2011  . IBS (irritable bowel syndrome)    with constipation  . Obesity 08/26/2007   Qualifier: Diagnosis of  By: Glori Bickers MD, Carmell Austria   . OSTEOARTHRITIS, HANDS, BILATERAL 08/26/2007   Qualifier: Diagnosis of  By: Glori Bickers MD, Carmell Austria   . Osteopenia   . OVERACTIVE BLADDER 08/26/2007   Qualifier: Diagnosis of  By: Glori Bickers MD, Carmell Austria   . Personal history of colonic polyps 11/03/2010   05/2018 13 polyps mostly  adenomas, ssp's - recall 1 year 2020 Gatha Mayer, MD, [facility]   . Poor balance 11/25/2018   With falls  Has rise in R shoe/post polio  Weak legs as well    . S/P TAVR (transcatheter aortic valve replacement)    s/p TAVR with a 1m Edwards S3U via the TF approach by Dr. BCyndia Bentand Dr. MAngelena Form . Severe calcific aortic stenosis 08/27/2017    -> Became severe as of October 2020-referred for TAVR on 04/28/2019  . URINARY INCONTINENCE 08/26/2007   Qualifier: Diagnosis of  By: TGlori BickersMD, MCarmell Austria  . Wears hearing aid in both ears    Past Surgical History:  Procedure Laterality Date  . ANKLE SURGERY Right 1956  . CARPAL TUNNEL RELEASE Bilateral 1986, 1993  . CATARACT EXTRACTION Right 11/16/2019  . COLONOSCOPY N/A 06/24/2015   Procedure: COLONOSCOPY;  Surgeon: CGatha Mayer MD;  [facility];  Service: Endoscopy;  Laterality: N/A;  . DILATATION & CURETTAGE/HYSTEROSCOPY WITH MYOSURE N/A 08/09/2017   Procedure: DILATATION & CURETTAGE/HYSTEROSCOPY WITH MYOSURE;  Surgeon: CChristophe Louis MD;  [facility];  Service: Gynecology;  Laterality: N/A;  Polypectomy  . DILATION AND CURETTAGE OF UTERUS     PMB  . HAND SURGERY Bilateral 1999   Thumbs   . KNEE SURGERY Bilateral 1998   x2  . LYMPH NODE BIOPSY N/A 10/08/2017   Procedure: LYMPH NODE BIOPSY;  Surgeon: REveritt Amber MD;  [facility];  Service: Gynecology;  Laterality: N/A;  . MULTIPLE TOOTH EXTRACTIONS    . NASAL SINUS SURGERY  1976  . polio surgery.     right foot - right leg 1.5" shorter than left leg - some wekness  . RIGHT/LEFT HEART CATH AND CORONARY ANGIOGRAPHY N/A 04/09/2019   Procedure: RIGHT/LEFT HEART CATH AND CORONARY ANGIOGRAPHY;  Surgeon: HLeonie Man MD;  [facility];  Service: Cardiovascular;; Severe AS, mean gradient 50 mmHg P-P 55 mmHg (estimated AVA 0.76 cm).  Mildly elevated pulmonary pressures with elevated LVEDP and PCWP.  Angiographically normal coronary arteries, but tortuous.  .  ROBOTIC ASSISTED TOTAL HYSTERECTOMY WITH BILATERAL SALPINGO OOPHERECTOMY Bilateral 10/08/2017   Procedure: XI ROBOTIC ASSISTED TOTAL HYSTERECTOMY WITH BILATERAL SALPINGO OOPHORECTOMY;  Surgeon: REveritt Amber MD;  [facility];  Service: Gynecology;  Laterality: Bilateral;  . TEE WITHOUT CARDIOVERSION N/A 04/28/2019   Procedure: TRANSESOPHAGEAL ECHOCARDIOGRAM (TEE);  Surgeon: MBurnell Blanks MD;  [facility];  Service: Open Heart Surgery;  Laterality: N/A;  . THUMB ARTHROSCOPY  1999  . TONSILLECTOMY  15434  74years old  . TRANSCATHETER AORTIC VALVE REPLACEMENT, TRANSFEMORAL N/A 04/28/2019   Procedure: TRANSCATHETER AORTIC VALVE REPLACEMENT, TRANSFEMORAL;  Surgeon: MBurnell Blanks MD;  [facility]; ; 23 mm Sapien 3 Ultra THV via TF approach. (post-op mean AV gradient 13 mmHg)  . TRANSTHORACIC ECHOCARDIOGRAM  1/'19-2/'20   a) mild LVH, ef 60-65%, grade 2 DD, Mod AoV thickening /mild calcified w/ Mod AS/Mild AR (valve  area 1.15cm^2, mean gradiant 83mHg, peak grandient 476mg)/ mild LAE and RAE/ trivial TR; b) 08/2018: Mod LVH. Mod-Severe AS (mild AI) - mean gradient now 38 mmHg. Mild-Mod MAC. No MS.  --notable progression of disease  . TRANSTHORACIC ECHOCARDIOGRAM  02/17/2019   Mod LVH. GR 2 DD. Severe AS (mean gradient = 44 mmHg with Mod AI. mild MS.  . Marland KitchenRANSTHORACIC ECHOCARDIOGRAM  04/29/2019   a)  (post TAVR): EF 60-65%.  GRII DD.  Mod LA dilation & nl RA.  Mod MAC.  No  AI (paravalvular leak-PVL), mean AV gradient 13 mmHg.;; b) EF 60 to 65%.  Severely increased thickness/LVH.  GRII DD w/ only mildly elevated LAP.  NL RA size.  Severe MAC with only trace MR.  No MS.  TAVR valve prosthesis functioning well.  Trivial AI.  Mean gradient 15 mmHg with a peak 29.8 mmHg. -- STABLE  . TUBAL LIGATION    . UPPER GI ENDOSCOPY     normal  . WISDOM TOOTH EXTRACTION     Family History  Problem Relation Age of Onset  . Kidney disease Mother   . Alzheimer's  disease Mother   . Stroke Father [id number]. Colon cancer Father [id number]     possibly colon cancer, possibly just polyps  . Kidney disease Sister   . Heart attack Sister   . Colon cancer Paternal Aunt [id number]. Cancer Paternal Uncle        unk type  . Esophageal cancer Neg Hx   . Stomach cancer Neg Hx   . Rectal cancer Neg Hx    Social History   Socioeconomic History  . Marital status: Married    Spouse name: Not on file  . Number of children: 2  . Years of education: Not on file  . Highest education level: High school graduate  Occupational History  . Occupation: RECEPTION    Employer: [employer]  Tobacco Use  . Smoking status: Never Smoker  . Smokeless tobacco: Never Used  Substance and Sexual Activity  . Alcohol use: No    Alcohol/week: 0.0 standard drinks  . Drug use: No  . Sexual activity: Yes    Birth control/protection: Post-menopausal  Other Topics Concern  . Not on file  Social History Narrative   She is relatively recently remarried.  She has 2  children and 3 grandchildren.     She is currently retired.  But enjoys cleaning house and doing chores.  She likes to do yard work.  Does not routinely exercise.   Social Determinants of Health   Financial Resource Strain: Low Risk   . Difficulty of Paying Living Expenses: Not hard at all  Food Insecurity: No Food Insecurity  . Worried About RuCharity fundraisern the Last Year: Never true  . Ran Out of Food in the Last Year: Never true  Transportation Needs: No Transportation Needs  . Lack of Transportation (Medical): No  . Lack of Transportation (Non-Medical): No  Physical Activity: Inactive  . Days of Exercise per Week: 0 days  . Minutes of Exercise per Session: 0 min  Stress: No Stress Concern Present  . Feeling of Stress : Not at all  Social Connections:   . Frequency of Communication with Friends and Family:   . Frequency of Social Gatherings with Friends and Family:   .  Attends Religious Services:   . Active Member of Clubs or Organizations:   .  Attends Archivist Meetings:   Marland Kitchen Marital Status:     Outpatient Encounter Medications as of 11/24/2019  Medication Sig  . acetaminophen (TYLENOL) 325 MG tablet Take 650 mg by mouth every 6 (six) hours as needed (for pain.).  Marland Kitchen acetic acid-hydrocortisone (ACETASOL HC) OTIC solution Place 2 drops into both ears 2 (two) times daily as needed (itching).  Marland Kitchen amLODipine (NORVASC) 5 MG tablet Take 1 tablet (5 mg total) by mouth daily.  . Ascorbic Acid (VITAMIN C) 1000 MG tablet Take 1,000 mg by mouth daily with breakfast.   . aspirin EC 81 MG tablet Take 81 mg by mouth daily.  . Black Pepper-Turmeric (TURMERIC COMPLEX/BLACK PEPPER PO) Take 15 mLs by mouth daily with breakfast.   . Calcium-Phosphorus-Vitamin D (CALCIUM/VITAMIN D3/ADULT GUMMY PO) Take 1 tablet by mouth 2 (two) times daily.   . cetirizine (ZYRTEC) 10 MG tablet Take 5 mg by mouth as needed for allergies.   . cholecalciferol (VITAMIN D) 1000 UNITS tablet Take 1,000 Units by mouth daily with breakfast.   . fluticasone (FLONASE) 50 MCG/ACT nasal spray Place 2 sprays into both nostrils at bedtime.   . GUAIFENESIN 1200 PO Take 1,200 mg by mouth at bedtime.   . hydrochlorothiazide (HYDRODIURIL) 25 MG tablet Take 1 tablet (25 mg total) by mouth daily with lunch.  . lubiprostone (AMITIZA) 24 MCG capsule Take 1 capsule (24 mcg total) by mouth 2 (two) times daily. LUNCH & SUPPER  . meloxicam (MOBIC) 7.5 MG tablet Take 7.5 mg by mouth at bedtime.  . metoprolol succinate (TOPROL XL) 25 MG 24 hr tablet Take 1 tablet (25 mg total) by mouth daily.  . Multiple Vitamin (MULTIVITAMIN WITH MINERALS) TABS tablet Take 1 tablet by mouth daily.   Marland Kitchen NEXIUM 40 MG capsule 1 Add'l Sig oral Select Frequency  . Polyethyl Glycol-Propyl Glycol (SYSTANE) 0.4-0.3 % SOLN Place 1-2 drops into both eyes 2 (two) times daily.   . potassium chloride (K-DUR) 10 MEQ tablet Take 2 tablets (20  mEq total) by mouth 2 (two) times daily.  . Simethicone (PHAZYME PO) Take 1 tablet by mouth at bedtime as needed (for gas).   . sodium chloride (AYR) 0.65 % nasal spray Place 1 spray into the nose daily.   Marland Kitchen venlafaxine XR (EFFEXOR XR) 75 MG 24 hr capsule Take 1 capsule (75 mg total) by mouth daily after breakfast.  . pantoprazole (PROTONIX) 40 MG tablet Take 1 tablet (40 mg total) by mouth daily. (Patient not taking: Reported on 11/24/2019)   Facility-Administered Encounter Medications as of 11/24/2019  Medication  . 0.9 %  sodium chloride infusion    Activities of Daily Living In your present state of health, do you have any difficulty performing the following activities: 11/24/2019 04/28/2019  Hearing? Y N  Comment wears hearing aids -  Vision? N N  Difficulty concentrating or making decisions? N N  Walking or climbing stairs? N N  Dressing or bathing? N N  Doing errands, shopping? N N  Preparing Food and eating ? N -  Using the Toilet? N -  In the past six months, have you accidently leaked urine? N -  Do you have problems with loss of bowel control? N -  Managing your Medications? N -  Managing your Finances? N -  Housekeeping or managing your Housekeeping? N -  Some recent data might be hidden    Patient Care Team: Tower, [name], MD as PCP - General Leonie Man, MD as PCP -  Cardiology (Cardiology) Regal, Tamala Fothergill, DPM as Consulting Physician (Podiatry) Ninetta Lights, MD (Inactive) as Consulting Physician (Orthopedic Surgery) Jerrell Belfast, MD as Consulting Physician (Otolaryngology) Lavella Hammock, DDS as Consulting Physician (Dentistry) Macarthur Critchley, OD as Referring Physician (Optometry)    Assessment:   This is a routine wellness examination for [place].  Exercise Activities and Dietary recommendations Current Exercise Habits: The patient does not participate in regular exercise at present, Exercise limited by: None identified  Goals    . DIET - INCREASE WATER  INTAKE     Starting 11/07/2017, I will continue to drink 3-4 12 oz bottles of water daily.     . Patient Stated     11/24/2019, I will maintain and continue medications as prescribed.        Fall Risk Fall Risk  11/24/2019 11/20/2018 01/16/2018 11/20/2017 11/07/2017  Falls in the past year? 1 1 Yes Yes Yes  Comment tripped on deck Several falls due to losing balance; soreness  - - fell in gym and fell at camping grounds; minor injuries  Number falls in past yr: 0 1 2 or more 2 or more 2 or more  Comment - - - - -  Injury with Fall? 0 1 Yes Yes Yes  Risk Factor Category  - - - High Fall Risk High Fall Risk  Risk for fall due to : Medication side effect;Impaired balance/gait History of fall(s);Impaired balance/gait Other (Comment) History of fall(s);Impaired balance/gait Impaired balance/gait;History of fall(s)  Risk for fall due to: Comment - - Pt tripped  - -  Follow up Falls evaluation completed;Falls prevention discussed - - Falls prevention discussed;Education provided -   Is the patient's home free of loose throw rugs in walkways, pet beds, electrical cords, etc?   yes      Grab bars in the bathroom? no      Handrails on the stairs?   yes      Adequate lighting?   yes  Timed Get Up and Go performed: N/A  Depression Screen PHQ 2/9 Scores 11/24/2019 11/20/2018 11/20/2017 11/07/2017  PHQ - 2 Score 1 0 4 4  PHQ- 9 Score 1 0 8 10     Cognitive Function MMSE - Mini Mental State Exam 11/24/2019 11/20/2018 11/07/2017 11/02/2016 11/01/2015  Orientation to time _0 Orientation to Place _1 Registration _2 Attention/ Calculation 5 0 0 0 0  Recall _3 Recall-comments - unable to recall 1 of 3 words - - -  Language- name 2 objects - 0 0 0 0  Language- repeat _4 Language- follow 3 step command - 0 _5 Language- read & follow direction - 0 0 0 0  Write a sentence - 0 0 0 0  Copy design - 0 0 0 0  Total score - _6 Mini Cog  Mini-Cog screen was  completed. Maximum score is 22. A value of 0 denotes this part of the MMSE was not completed or the patient failed this part of the Mini-Cog screening.       Immunization History  Administered Date(s) Administered  . Fluad Quad(high Dose 65+) 05/06/2019  . Influenza Whole 04/08/2008, 06/30/2009  . Influenza, High Dose Seasonal PF 05/17/2015, 05/07/2016, 05/07/2017, 04/22/2018  . Influenza-Unspecified 05/09/2014  . PFIZER SARS-COV-2 Vaccination 07/30/2019, 08/20/2019  . Pneumococcal Conjugate-13 10/12/2014  . Pneumococcal Polysaccharide-23 08/25/2010  .  Td 10/15/2003  . Tdap 06/18/2018  . Zoster 08/12/2008    Qualifies for Shingles Vaccine: yes   Screening Tests Health Maintenance  Topic Date Due  . INFLUENZA VACCINE  02/07/2020  . MAMMOGRAM  06/25/2020  . COLONOSCOPY  11/09/2020  . TETANUS/TDAP  06/18/2028  . DEXA SCAN  Completed  . COVID-19 Vaccine  Completed  . Hepatitis C Screening  Completed  . PNA vac Low Risk Adult  Completed    Cancer Screenings: Lung: Low Dose CT Chest recommended if Age 12-80 years, 30 pack-year currently smoking OR have quit w/in 15 years. Patient does not qualify. Breast:  Up to date on Mammogram: Yes, completed 06/26/2019   Bone Density/Dexa: completed 05/06/2017 Colorectal: completed 11/10/2019  Additional Screenings:  Hepatitis C Screening: 11/02/2016     Plan:   Patient will maintain and continue medications as prescribed.    I have personally reviewed and noted the following in the patient's chart:   . Medical and social history . Use of alcohol, tobacco or illicit drugs  . Current medications and supplements . Functional ability and status . Nutritional status . Physical activity . Advanced directives . List of other physicians . Hospitalizations, surgeries, and ER visits in previous 12 months . Vitals . Screenings to include cognitive, depression, and falls . Referrals and appointments  In addition, I have reviewed and  discussed with patient certain preventive protocols, quality metrics, and best practice recommendations. A written personalized care plan for preventive services as well as general preventive health recommendations were provided to patient.     Andrez Grime, LPN  0/03/2329

## 2019-11-24 NOTE — Patient Instructions (Signed)
Veronica Greene , Thank you for taking time to come for your Medicare Wellness Visit. I appreciate your ongoing commitment to your health goals. Please review the following plan we discussed and let me know if I can assist you in the future.   Screening recommendations/referrals: Colonoscopy: Up to date, completed 11/10/2019 Mammogram: Up to date, completed 06/26/2019 Bone Density: completed 05/06/2017 Recommended yearly ophthalmology/optometry visit for glaucoma screening and checkup Recommended yearly dental visit for hygiene and checkup  Vaccinations: Influenza vaccine: Up to date, completed 05/06/2019 Pneumococcal vaccine: Completed series Tdap vaccine: Up to date, completed 06/18/2018 Shingles vaccine: discussed    Advanced directives: Please bring a copy of your POA (Power of [place]) and/or Living Will to your next appointment.   Conditions/risks identified: hypertension, hyperlipidemia  Next appointment: 11/26/2019 @ 9:30 am    Preventive Care 65 Years and Older, Female Preventive care refers to lifestyle choices and visits with your health care provider that can promote health and wellness. What does preventive care include?  A yearly physical exam. This is also called an annual well check.  Dental exams once or twice a year.  Routine eye exams. Ask your health care provider how often you should have your eyes checked.  Personal lifestyle choices, including:  Daily care of your teeth and gums.  Regular physical activity.  Eating a healthy diet.  Avoiding tobacco and drug use.  Limiting alcohol use.  Practicing safe sex.  Taking low-dose aspirin every day.  Taking vitamin and mineral supplements as recommended by your health care provider. What happens during an annual well check? The services and screenings done by your health care provider during your annual well check will depend on your age, overall health, lifestyle risk factors, and family history of  disease. Counseling  Your health care provider may ask you questions about your:  Alcohol use.  Tobacco use.  Drug use.  Emotional well-being.  Home and relationship well-being.  Sexual activity.  Eating habits.  History of falls.  Memory and ability to understand (cognition).  Work and work Statistician.  Reproductive health. Screening  You may have the following tests or measurements:  Height, weight, and BMI.  Blood pressure.  Lipid and cholesterol levels. These may be checked every 5 years, or more frequently if you are over 74 years old.  Skin check.  Lung cancer screening. You may have this screening every year starting at age 54 if you have a 30-pack-year history of smoking and currently smoke or have quit within the past 15 years.  Fecal occult blood test (FOBT) of the stool. You may have this test every year starting at age 80.  Flexible sigmoidoscopy or colonoscopy. You may have a sigmoidoscopy every 5 years or a colonoscopy every 10 years starting at age 71.  Hepatitis C blood test.  Hepatitis B blood test.  Sexually transmitted disease (STD) testing.  Diabetes screening. This is done by checking your blood sugar (glucose) after you have not eaten for a while (fasting). You may have this done every 1-3 years.  Bone density scan. This is done to screen for osteoporosis. You may have this done starting at age 81.  Mammogram. This may be done every 1-2 years. Talk to your health care provider about how often you should have regular mammograms. Talk with your health care provider about your test results, treatment options, and if necessary, the need for more tests. Vaccines  Your health care provider may recommend certain vaccines, such as:  Influenza vaccine.  This is recommended every year.  Tetanus, diphtheria, and acellular pertussis (Tdap, Td) vaccine. You may need a Td booster every 10 years.  Zoster vaccine. You may need this after age  40.  Pneumococcal 13-valent conjugate (PCV13) vaccine. One dose is recommended after age 73.  Pneumococcal polysaccharide (PPSV23) vaccine. One dose is recommended after age 31. Talk to your health care provider about which screenings and vaccines you need and how often you need them. This information is not intended to replace advice given to you by your health care provider. Make sure you discuss any questions you have with your health care provider. Document Released: 07/22/2015 Document Revised: 03/14/2016 Document Reviewed: 04/26/2015 Elsevier Interactive Patient Education  2017 [place] Prevention in the Home Falls can cause injuries. They can happen to people of all ages. There are many things you can do to make your home safe and to help prevent falls. What can I do on the outside of my home?  Regularly fix the edges of walkways and driveways and fix any cracks.  Remove anything that might make you trip as you walk through a door, such as a raised step or threshold.  Trim any bushes or trees on the path to your home.  Use bright outdoor lighting.  Clear any walking paths of anything that might make someone trip, such as rocks or tools.  Regularly check to see if handrails are loose or broken. Make sure that both sides of any steps have handrails.  Any raised decks and porches should have guardrails on the edges.  Have any leaves, snow, or ice cleared regularly.  Use sand or salt on walking paths during winter.  Clean up any spills in your garage right away. This includes oil or grease spills. What can I do in the bathroom?  Use night lights.  Install grab bars by the toilet and in the tub and shower. Do not use towel bars as grab bars.  Use non-skid mats or decals in the tub or shower.  If you need to sit down in the shower, use a plastic, non-slip stool.  Keep the floor dry. Clean up any water that spills on the floor as soon as it happens.  Remove  soap buildup in the tub or shower regularly.  Attach bath mats securely with double-sided non-slip rug tape.  Do not have throw rugs and other things on the floor that can make you trip. What can I do in the bedroom?  Use night lights.  Make sure that you have a light by your bed that is easy to reach.  Do not use any sheets or blankets that are too big for your bed. They should not hang down onto the floor.  Have a firm chair that has side arms. You can use this for support while you get dressed.  Do not have throw rugs and other things on the floor that can make you trip. What can I do in the kitchen?  Clean up any spills right away.  Avoid walking on wet floors.  Keep items that you use a lot in easy-to-reach places.  If you need to reach something above you, use a strong step stool that has a grab bar.  Keep electrical cords out of the way.  Do not use floor polish or wax that makes floors slippery. If you must use wax, use non-skid floor wax.  Do not have throw rugs and other things on the floor that can make  you trip. What can I do with my stairs?  Do not leave any items on the stairs.  Make sure that there are handrails on both sides of the stairs and use them. Fix handrails that are broken or loose. Make sure that handrails are as long as the stairways.  Check any carpeting to make sure that it is firmly attached to the stairs. Fix any carpet that is loose or worn.  Avoid having throw rugs at the top or bottom of the stairs. If you do have throw rugs, attach them to the floor with carpet tape.  Make sure that you have a light switch at the top of the stairs and the bottom of the stairs. If you do not have them, ask someone to add them for you. What else can I do to help prevent falls?  Wear shoes that:  Do not have high heels.  Have rubber bottoms.  Are comfortable and fit you well.  Are closed at the toe. Do not wear sandals.  If you use a  stepladder:  Make sure that it is fully opened. Do not climb a closed stepladder.  Make sure that both sides of the stepladder are locked into place.  Ask someone to hold it for you, if possible.  Clearly mark and make sure that you can see:  Any grab bars or handrails.  First and last steps.  Where the edge of each step is.  Use tools that help you move around (mobility aids) if they are needed. These include:  Canes.  Walkers.  Scooters.  Crutches.  Turn on the lights when you go into a dark area. Replace any light bulbs as soon as they burn out.  Set up your furniture so you have a clear path. Avoid moving your furniture around.  If any of your floors are uneven, fix them.  If there are any pets around you, be aware of where they are.  Review your medicines with your doctor. Some medicines can make you feel dizzy. This can increase your chance of falling. Ask your doctor what other things that you can do to help prevent falls. This information is not intended to replace advice given to you by your health care provider. Make sure you discuss any questions you have with your health care provider. Document Released: 04/21/2009 Document Revised: 12/01/2015 Document Reviewed: 07/30/2014 Elsevier Interactive Patient Education  2017 [employer].

## 2019-11-24 NOTE — Progress Notes (Signed)
PCP notes:  Health Maintenance: No gaps noted   Abnormal Screenings: none   Patient concerns: Complains of dizziness- has periods that she feels like she might pass out.  Left leg cramping at night time only  Needs a breast exam completed   Nurse concerns: none   Next PCP appt.: 11/26/2019 @ 9:30 am

## 2019-11-25 ENCOUNTER — Telehealth: Payer: Self-pay | Admitting: Internal Medicine

## 2019-11-25 NOTE — Telephone Encounter (Signed)
Patient notified of the results and recommendations All of her questions were answered.

## 2019-11-25 NOTE — Telephone Encounter (Signed)
Patient calling for path results states she does not use Mychart and she has not yet received her letter

## 2019-11-26 ENCOUNTER — Encounter: Payer: Self-pay | Admitting: Family Medicine

## 2019-11-26 ENCOUNTER — Ambulatory Visit (INDEPENDENT_AMBULATORY_CARE_PROVIDER_SITE_OTHER): Payer: PPO | Admitting: Family Medicine

## 2019-11-26 ENCOUNTER — Other Ambulatory Visit: Payer: Self-pay

## 2019-11-26 VITALS — BP 130/52 | HR 73 | Temp 97.3°F | Wt 182.1 lb

## 2019-11-26 DIAGNOSIS — I10 Essential (primary) hypertension: Secondary | ICD-10-CM | POA: Diagnosis not present

## 2019-11-26 DIAGNOSIS — E782 Mixed hyperlipidemia: Secondary | ICD-10-CM | POA: Diagnosis not present

## 2019-11-26 DIAGNOSIS — Z6830 Body mass index (BMI) 30.0-30.9, adult: Secondary | ICD-10-CM | POA: Diagnosis not present

## 2019-11-26 DIAGNOSIS — F419 Anxiety disorder, unspecified: Secondary | ICD-10-CM

## 2019-11-26 DIAGNOSIS — E6609 Other obesity due to excess calories: Secondary | ICD-10-CM

## 2019-11-26 DIAGNOSIS — Z0000 Encounter for general adult medical examination without abnormal findings: Secondary | ICD-10-CM | POA: Diagnosis not present

## 2019-11-26 DIAGNOSIS — K581 Irritable bowel syndrome with constipation: Secondary | ICD-10-CM | POA: Diagnosis not present

## 2019-11-26 DIAGNOSIS — M8580 Other specified disorders of bone density and structure, unspecified site: Secondary | ICD-10-CM

## 2019-11-26 DIAGNOSIS — Z8542 Personal history of malignant neoplasm of other parts of uterus: Secondary | ICD-10-CM

## 2019-11-26 DIAGNOSIS — F329 Major depressive disorder, single episode, unspecified: Secondary | ICD-10-CM | POA: Diagnosis not present

## 2019-11-26 MED ORDER — LUBIPROSTONE 24 MCG PO CAPS: 24.0000 ug | ORAL_CAPSULE | Freq: Two times a day (BID) | ORAL | 3 refills | Status: DC

## 2019-11-26 MED ORDER — HYDROCHLOROTHIAZIDE 25 MG PO TABS: 25.0000 mg | ORAL_TABLET | Freq: Every day | ORAL | 3 refills | Status: DC

## 2019-11-26 MED ORDER — POTASSIUM CHLORIDE ER 10 MEQ PO TBCR: 20.0000 meq | EXTENDED_RELEASE_TABLET | Freq: Two times a day (BID) | ORAL | 3 refills | Status: DC

## 2019-11-26 MED ORDER — VENLAFAXINE HCL ER 75 MG PO CP24: 75.0000 mg | ORAL_CAPSULE | Freq: Every day | ORAL | 3 refills | Status: DC

## 2019-11-26 NOTE — Assessment & Plan Note (Signed)
Reviewed health habits including diet and exercise and skin cancer prevention Reviewed appropriate screening tests for age  Also reviewed health mt list, fam hx and immunization status , as well as social and family history   See HPI Labs reviewed  Disc shigrix vaccine Has had covid vaccination amw reviewed  Mammogram and colonoscopy utd  Fall prevention discussed and handout given

## 2019-11-26 NOTE — Assessment & Plan Note (Signed)
Doing well with oncology and rad f/u

## 2019-11-26 NOTE — Progress Notes (Signed)
Subjective:    Patient ID: Veronica Greene, female    DOB: 08/21/1945, 74 y.o.   MRN: [id number]  This visit occurred during the SARS-CoV-2 public health emergency.  Safety protocols were in place, including screening questions prior to the visit, additional usage of staff PPE, and extensive cleaning of exam room while observing appropriate contact time as indicated for disinfecting solutions.    HPI Here for health maintenance exam and to review chronic medical problems    Wt Readings from Last 3 Encounters:  11/26/19 182 lb 2 oz (82.6 kg)  11/10/19 179 lb (81.2 kg)  10/15/19 179 lb 8 oz (81.4 kg)   31.26 kg/m   Feeling pretty good  Has occasional dizziness   (has h/o vertigo) No syncope  No sob or chest pain    Had amw on 5/18 No gaps -noted due for breast exam  covid status-vaccinated   Zoster status -zostavax 2/10  May be interested in shingrix   Mammogram 12/20  Self breast exam - no lumps  She gets sore after heavy work (lifting)    Colonoscopy 11/10/19 with a 3 y recall   Just had eye surgery -cataract -went well  Other eye is June 7   dexa 10/18 -normal range BMD Falls -one fall early in May -tripped over a chair on the deck   (no injuries)  Has to wear a lift in chair /has to be extra careful  Took up her rugs Often uses a cane  Fractures -none  Supplements -D and Ca Exercise - walking and does outdoor work  In the past- yoga caused her neck problem (years ago) - severe vertigo with that    HTN  bp is stable today  No cp or palpitations or headaches or edema  No side effects to medicines  BP Readings from Last 3 Encounters:  11/26/19 (!) 130/52  11/10/19 125/79  10/15/19 140/70     Pulse Readings from Last 3 Encounters:  11/26/19 73  11/10/19 61  10/15/19 68   Lab Results  Component Value Date   CREATININE 0.65 11/20/2019   BUN 20 11/20/2019   NA 138 11/20/2019   K 3.9 11/20/2019   CL 102 11/20/2019   CO2 29 11/20/2019   Has  had aortic valve replacement -did well with that   H/o endometrial cancer  Still sees the oncologist -doing well  Has f/u next month with onc and rad  Will spread out to every 6 months  Also has to use a dilator    Hyperlipidemia  Lab Results  Component Value Date   CHOL 178 11/20/2019   CHOL 181 11/20/2018   CHOL 170 11/07/2017   Lab Results  Component Value Date   HDL 46.00 11/20/2019   HDL 46.90 11/20/2018   HDL 54.10 11/07/2017   Lab Results  Component Value Date   LDLCALC 111 (H) 11/20/2019   LDLCALC 105 (H) 11/20/2018   [place] 90 11/07/2017   Lab Results  Component Value Date   TRIG 107.0 11/20/2019   TRIG 147.0 11/20/2018   TRIG 129.0 11/07/2017   Lab Results  Component Value Date   CHOLHDL 4 11/20/2019   CHOLHDL 4 11/20/2018   CHOLHDL 3 11/07/2017   No results found for: LDLDIRECT  Avoids fried foods  occ hamburger  occ ice cream  Seldom shellfish     Other labs Glucose of 99 Lab Results  Component Value Date   ALT 15 11/20/2019   AST 19 11/20/2019  ALKPHOS 56 11/20/2019   BILITOT 0.8 11/20/2019   Lab Results  Component Value Date   WBC 4.4 11/20/2019   HGB 12.7 11/20/2019   HCT 36.3 11/20/2019   MCV 89.7 11/20/2019   PLT 185.0 11/20/2019   Lab Results  Component Value Date   TSH 1.98 11/20/2019     Review of Systems  Constitutional: Negative for activity change, appetite change, fatigue, fever and unexpected weight change.  HENT: Negative for congestion, ear pain, rhinorrhea, sinus pressure and sore throat.   Eyes: Negative for pain, redness and visual disturbance.  Respiratory: Negative for cough, shortness of breath and wheezing.   Cardiovascular: Negative for chest pain and palpitations.  Gastrointestinal: Negative for abdominal pain, blood in stool, constipation and diarrhea.  Endocrine: Negative for polydipsia and polyuria.  Genitourinary: Negative for dysuria, frequency and urgency.  Musculoskeletal: Positive for  arthralgias. Negative for back pain and myalgias.       Some leg cramps at night   Skin: Negative for pallor and rash.  Allergic/Immunologic: Negative for environmental allergies.  Neurological: Negative for dizziness, syncope and headaches.       Poor balance   Hematological: Negative for adenopathy. Does not bruise/bleed easily.  Psychiatric/Behavioral: Negative for decreased concentration and dysphoric mood. The patient is not nervous/anxious.        Objective:   Physical Exam Constitutional:      General: She is not in acute distress.    Appearance: Normal appearance. She is well-developed. She is not ill-appearing or diaphoretic.  HENT:     Head: Normocephalic and atraumatic.     Right Ear: Tympanic membrane, ear canal and external ear normal.     Left Ear: Tympanic membrane, ear canal and external ear normal.     Nose: Nose normal. No congestion.     Mouth/Throat:     Mouth: Mucous membranes are moist.     Pharynx: Oropharynx is clear. No posterior oropharyngeal erythema.  Eyes:     General: No scleral icterus.    Extraocular Movements: Extraocular movements intact.     Conjunctiva/sclera: Conjunctivae normal.     Pupils: Pupils are equal, round, and reactive to light.  Neck:     Thyroid: No thyromegaly.     Vascular: No carotid bruit or JVD.  Cardiovascular:     Rate and Rhythm: Normal rate and regular rhythm.     Pulses: Normal pulses.     Heart sounds: Murmur present. No gallop.   Pulmonary:     Effort: Pulmonary effort is normal. No respiratory distress.     Breath sounds: Normal breath sounds. No wheezing.     Comments: Good air exch Chest:     Chest wall: No tenderness.  Abdominal:     General: Bowel sounds are normal. There is no distension or abdominal bruit.     Palpations: Abdomen is soft. There is no mass.     Tenderness: There is no abdominal tenderness.     Hernia: No hernia is present.  Genitourinary:    Comments: Breast exam: No mass, nodules,  thickening, tenderness, bulging, retraction, inflamation, nipple discharge or skin changes noted.  No axillary or clavicular LA.     Musculoskeletal:        General: No tenderness. Normal range of motion.     Cervical back: Normal range of motion and neck supple. No rigidity. No muscular tenderness.     Right lower leg: No edema.     Left lower leg: No edema.  Comments: Baseline R leg shorter and smaller than L (past polio)  Lymphadenopathy:     Cervical: No cervical adenopathy.  Skin:    General: Skin is warm and dry.     Coloration: Skin is not pale.     Findings: No erythema or rash.     Comments: Solar lentigines diffusely Some angiomas   Neurological:     Mental Status: She is alert. Mental status is at baseline.     Cranial Nerves: No cranial nerve deficit.     Motor: No abnormal muscle tone.     Coordination: Coordination normal.     Gait: Gait normal.     Deep Tendon Reflexes: Reflexes are normal and symmetric. Reflexes normal.  Psychiatric:        Mood and Affect: Mood normal.        Cognition and Memory: Cognition and memory normal.           Assessment & Plan:   Problem List Items Addressed This Visit      Cardiovascular and Mediastinum   Essential hypertension (Chronic)    bp in fair control at this time  BP Readings from Last 1 Encounters:  11/26/19 (!) 130/52   No changes needed Most recent labs reviewed  Disc lifstyle change with low sodium diet and exercise        Relevant Medications   hydrochlorothiazide (HYDRODIURIL) 25 MG tablet     Digestive   IBS    Refilled amitiza which she takes bid  Enc good water intake      Relevant Medications   lubiprostone (AMITIZA) 24 MCG capsule     Musculoskeletal and Integument   Osteopenia    Nl bmd with last dexa/reviewed  One fall (as high fall risk, again counseled on this)- using cane  No fractures Taking ca and D  Exercise-walking (enc to do this safely/with support)        Other   HLD  (hyperlipidemia) (Chronic)    Mild and fairly stable Disc goals for lipids and reasons to control them Rev last labs with pt Rev low sat fat diet in detail  Urged good diet to get LDL under 100 if possible      Relevant Medications   hydrochlorothiazide (HYDRODIURIL) 25 MG tablet   Obesity    Discussed how this problem influences overall health and the risks it imposes  Reviewed plan for weight loss with lower calorie diet (via better food choices and also portion control or program like weight watchers) and exercise building up to or more than 30 minutes 5 days per week including some aerobic activity         Anxiety and depression    Stable and well controlled with generic effexor xr Reviewed stressors/ coping techniques/symptoms/ support sources/ tx options and side effects in detail today       Relevant Medications   venlafaxine XR (EFFEXOR XR) 75 MG 24 hr capsule   Routine general medical examination at a health care facility - Primary    Reviewed health habits including diet and exercise and skin cancer prevention Reviewed appropriate screening tests for age  Also reviewed health mt list, fam hx and immunization status , as well as social and family history   See HPI Labs reviewed  Disc shigrix vaccine Has had covid vaccination amw reviewed  Mammogram and colonoscopy utd  Fall prevention discussed and handout given          History of endometrial cancer  Doing well with oncology and rad f/u

## 2019-11-26 NOTE — Assessment & Plan Note (Signed)
Refilled amitiza which she takes bid  Enc good water intake

## 2019-11-26 NOTE — Assessment & Plan Note (Signed)
Discussed how this problem influences overall health and the risks it imposes  Reviewed plan for weight loss with lower calorie diet (via better food choices and also portion control or program like weight watchers) and exercise building up to or more than 30 minutes 5 days per week including some aerobic activity    

## 2019-11-26 NOTE — Patient Instructions (Addendum)
If you are interested in the new shingles vaccine (Shingrix) - call your local pharmacy to check on coverage and availability  If affordable, get on a wait list at your pharmacy to get the vaccine.  Keep trying to exercise daily   For cholesterol  Avoid red meat/ fried foods/ egg yolks/ fatty breakfast meats/ butter, cheese and high fat dairy/ and shellfish

## 2019-11-26 NOTE — Assessment & Plan Note (Signed)
bp in fair control at this time  BP Readings from Last 1 Encounters:  11/26/19 (!) 130/52   No changes needed Most recent labs reviewed  Disc lifstyle change with low sodium diet and exercise

## 2019-11-26 NOTE — Assessment & Plan Note (Signed)
Mild and fairly stable Disc goals for lipids and reasons to control them Rev last labs with pt Rev low sat fat diet in detail  Urged good diet to get LDL under 100 if possible

## 2019-11-26 NOTE — Assessment & Plan Note (Signed)
Nl bmd with last dexa/reviewed  One fall (as high fall risk, again counseled on this)- using cane  No fractures Taking ca and D  Exercise-walking (enc to do this safely/with support)

## 2019-11-26 NOTE — Assessment & Plan Note (Signed)
Stable and well controlled with generic effexor xr Reviewed stressors/ coping techniques/symptoms/ support sources/ tx options and side effects in detail today

## 2019-12-14 DIAGNOSIS — H2512 Age-related nuclear cataract, left eye: Secondary | ICD-10-CM | POA: Diagnosis not present

## 2019-12-16 ENCOUNTER — Encounter: Payer: Self-pay | Admitting: Gynecologic Oncology

## 2019-12-16 ENCOUNTER — Other Ambulatory Visit: Payer: Self-pay

## 2019-12-16 ENCOUNTER — Inpatient Hospital Stay: Payer: PPO | Attending: Gynecologic Oncology | Admitting: Gynecologic Oncology

## 2019-12-16 VITALS — BP 151/55 | HR 75 | Temp 98.4°F | Resp 17 | Ht 64.0 in | Wt 185.0 lb

## 2019-12-16 DIAGNOSIS — Z90710 Acquired absence of both cervix and uterus: Secondary | ICD-10-CM | POA: Insufficient documentation

## 2019-12-16 DIAGNOSIS — C541 Malignant neoplasm of endometrium: Secondary | ICD-10-CM | POA: Insufficient documentation

## 2019-12-16 DIAGNOSIS — Z90722 Acquired absence of ovaries, bilateral: Secondary | ICD-10-CM | POA: Insufficient documentation

## 2019-12-16 DIAGNOSIS — Z923 Personal history of irradiation: Secondary | ICD-10-CM | POA: Insufficient documentation

## 2019-12-16 NOTE — Patient Instructions (Signed)
Please notify Dr Denman George at phone number [phone] if you notice vaginal bleeding, new pelvic or abdominal pains, bloating, feeling full easy, or a change in bladder or bowel function.   Please have Dr Clabe Seal office contact Dr Serita Grit office (at [phone]) in December after your appointment with him to request an appointment with her for June, 2022.

## 2019-12-16 NOTE — Progress Notes (Signed)
Follow-up Note: Gyn-Onc  Consult was requested by Dr. Landry Mellow for the evaluation of [name] 74 y.o. female  CC:  Chief Complaint  Patient presents with  . Endometrial cancer [facility])    Assessment/Plan:  Ms. Sibel Khurana  is a 74 y.o.  year old with stage IB grade 2 endometrioid endometrial adenocarcinoma (MSI high/MMR normal).  High/intermediate risk factors for recurrence. s/p vaginal brachytherapy completed 12/19/17.  I recommend she continue follow-up at 6 monthly intervals for symptom review, physical examination and pelvic examination. Pap smear is not recommended in routine endometrial cancer surveillance.  HPI: Ms Moyano is a 74 year old parous woman who is seen in consultation at the request of Dr Landry Mellow for grade 1 endometrial cancer.  She first began vaginal spotting in July 2017.  She was seen for an annual visit with Dr. Landry Mellow in October 2018 and reported this symptom.  A transvaginal ultrasound scan was performed on June 25, 2017 and revealed a normal-sized uterus measuring 7 x 4 x 2.7 cm with normal ovaries bilaterally.  The endometrial thickness was 1.28 cm.  On saline infusion the endometrial hyperechoic mass measured 7 mm. The tumor was MSI high, MMR normal.  She had MLH1 promotor methylation in her tumor but is negative for Lynch syndrome on genetic testing.   She was noted to have a systolic cardiac murmur and was seen by cardiology who performed a transthoracic echo on July 30, 2017.  This revealed moderate aortic stenosis.  The left ventricle was grossly normal in cavity size, though with some increased ventricular wall thickness.  Ejection fraction was 60-65%.  She was cleared for surgical procedure.  Of note she was asymptomatic from this aortic stenosis.  She has a remote history of rheumatic heart disease as a child.  On August 09, 2017 she underwent a hysteroscopy D&C.  Final pathology confirmed a FIGO grade 1 endometrioid endometrial  adenocarcinoma.  The patient is otherwise very healthy.  She has had no prior abdominal surgeries.  On 10/08/17 she underwent robotic assisted total hysterectomy, BSO and SLN biopsy. Final pathology revealed a 3.2cm grade 2 endometrioid endometrial adenocarcinoma with outer half myo invasion (1 of 1.2cm) and no LVSI. All SLN's were negative as were adnexa and cervix. MSI pending. She was determined to have high/intermediate risk factors and was recommended to have vaginal brachytherapy in accordance with NCCN guidelines.  She completed vaginal brachytherapy with 30Gy in 5 fractions on 12/19/17. She has persistent right lower quadrant intermittent pains which were evaluated with a CT scan in June, 2020 which was negative for recurrence.  Interval Hx:  She had an aortic valve replaced in 2020 and is doing very well after this.   She has no symptoms concerning for recurrence.   Current Meds:  Outpatient Encounter Medications as of 12/16/2019  Medication Sig  . acetaminophen (TYLENOL) 325 MG tablet Take 650 mg by mouth every 6 (six) hours as needed (for pain.).  Marland Kitchen acetic acid-hydrocortisone (ACETASOL HC) OTIC solution Place 2 drops into both ears 2 (two) times daily as needed (itching).  Marland Kitchen amLODipine (NORVASC) 5 MG tablet Take 1 tablet (5 mg total) by mouth daily.  . Ascorbic Acid (VITAMIN C) 1000 MG tablet Take 1,000 mg by mouth daily with breakfast.   . aspirin EC 81 MG tablet Take 81 mg by mouth daily.  . Black Pepper-Turmeric (TURMERIC COMPLEX/BLACK PEPPER PO) Take 15 mLs by mouth daily with breakfast.   . Calcium-Phosphorus-Vitamin D (CALCIUM/VITAMIN D3/ADULT GUMMY PO)  Take 1 tablet by mouth 2 (two) times daily.   . cetirizine (ZYRTEC) 10 MG tablet Take 5 mg by mouth as needed for allergies.   . cholecalciferol (VITAMIN D) 1000 UNITS tablet Take 1,000 Units by mouth daily with breakfast.   . fluticasone (FLONASE) 50 MCG/ACT nasal spray Place 2 sprays into both nostrils at bedtime.   .  GUAIFENESIN 1200 PO Take 1,200 mg by mouth at bedtime.   . hydrochlorothiazide (HYDRODIURIL) 25 MG tablet Take 1 tablet (25 mg total) by mouth daily with lunch.  . lubiprostone (AMITIZA) 24 MCG capsule Take 1 capsule (24 mcg total) by mouth 2 (two) times daily. LUNCH & SUPPER  . meloxicam (MOBIC) 7.5 MG tablet Take 7.5 mg by mouth at bedtime.  . metoprolol succinate (TOPROL XL) 25 MG 24 hr tablet Take 1 tablet (25 mg total) by mouth daily.  . Multiple Vitamin (MULTIVITAMIN WITH MINERALS) TABS tablet Take 1 tablet by mouth daily.   Marland Kitchen NEXIUM 40 MG capsule 1 Add'l Sig oral Select Frequency  . pantoprazole (PROTONIX) 40 MG tablet Take 1 tablet (40 mg total) by mouth daily.  Vladimir Faster Glycol-Propyl Glycol (SYSTANE) 0.4-0.3 % SOLN Place 1-2 drops into both eyes 2 (two) times daily.   . potassium chloride (KLOR-CON) 10 MEQ tablet Take 2 tablets (20 mEq total) by mouth 2 (two) times daily.  . Simethicone (PHAZYME PO) Take 1 tablet by mouth at bedtime as needed (for gas).   . sodium chloride (AYR) 0.65 % nasal spray Place 1 spray into the nose daily.   Marland Kitchen venlafaxine XR (EFFEXOR XR) 75 MG 24 hr capsule Take 1 capsule (75 mg total) by mouth daily after breakfast.   Facility-Administered Encounter Medications as of 12/16/2019  Medication  . 0.9 %  sodium chloride infusion    Allergy:  Allergies  Allergen Reactions  . Clarithromycin Swelling and Other (See Comments)    Throat Swells  . Penicillins Hives and Other (See Comments)    WELTS Has patient had a PCN reaction causing immediate rash, facial/tongue/throat swelling, SOB or lightheadedness with hypotension: No Has patient had a PCN reaction causing severe rash involving mucus membranes or skin necrosis: Unknown Has patient had a PCN reaction that required hospitalization:No Has patient had a PCN reaction occurring within the last 10 years: No If all of the above answers are "NO", then may proceed with Cephalosporin use.   . Codeine Nausea And  Vomiting    Social Hx:   Social History   Socioeconomic History  . Marital status: Married    Spouse name: Not on file  . Number of children: 2  . Years of education: Not on file  . Highest education level: High school graduate  Occupational History  . 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 Smoking status: Never Smoker  . Smokeless tobacco: Never Used  Substance and Sexual Activity  . Alcohol use: No    Alcohol/week: 0.0 standard drinks  . Drug use: No  . Sexual activity: Yes    Birth control/protection: Post-menopausal  Other Topics Concern  . Not on file  Social History Narrative   She is relatively recently remarried.  She has 2  children and 3 grandchildren.     She is currently retired.  But enjoys cleaning house and doing chores.  She likes to do yard work.  Does not routinely exercise.   Social Determinants of Health  Financial Resource Strain: Low Risk   . Difficulty of Paying Living Expenses: Not hard at all  Food Insecurity: No Food Insecurity  . Worried About Charity fundraiser in the Last Year: Never true  . Ran Out of Food in the Last Year: Never true  Transportation Needs: No Transportation Needs  . Lack of Transportation (Medical): No  . Lack of Transportation (Non-Medical): No  Physical Activity: Inactive  . Days of Exercise per Week: 0 days  . Minutes of Exercise per Session: 0 min  Stress: No Stress Concern Present  . Feeling of Stress : Not at all  Social Connections:   . Frequency of Communication with Friends and Family:   . Frequency of Social Gatherings with Friends and Family:   . Attends Religious Services:   . Active Member of Clubs or Organizations:   . Attends Archivist Meetings:   Marland Kitchen Marital Status:   Intimate Partner Violence: Not At Risk  . Fear of Current or Ex-Partner: No  . Emotionally Abused: No  . Physically Abused: No  . Sexually Abused: No     Past Surgical Hx:  Past Surgical History:  Procedure Laterality Date  . ANKLE SURGERY Right 1956  . CARPAL TUNNEL RELEASE Bilateral 1986, 1993  . CATARACT EXTRACTION Right 11/16/2019  . COLONOSCOPY N/A 06/24/2015   Procedure: COLONOSCOPY;  Surgeon: Gatha Mayer, MD;  Location: [facility];  Service: Endoscopy;  Laterality: N/A;  . DILATATION & CURETTAGE/HYSTEROSCOPY WITH MYOSURE N/A 08/09/2017   Procedure: DILATATION & CURETTAGE/HYSTEROSCOPY WITH MYOSURE;  Surgeon: Christophe Louis, MD;  Location: [facility];  Service: Gynecology;  Laterality: N/A;  Polypectomy  . DILATION AND CURETTAGE OF UTERUS     PMB  . HAND SURGERY Bilateral 1999   Thumbs   . KNEE SURGERY Bilateral 1998   x2  . LYMPH NODE BIOPSY N/A 10/08/2017   Procedure: LYMPH NODE BIOPSY;  Surgeon: [name], MD;  Location: [facility];  Service: Gynecology;  Laterality: N/A;  . MULTIPLE TOOTH EXTRACTIONS    . NASAL SINUS SURGERY  1976  . polio surgery.     right foot - right leg 1.5" shorter than left leg - some wekness  . RIGHT/LEFT HEART CATH AND CORONARY ANGIOGRAPHY N/A 04/09/2019   Procedure: RIGHT/LEFT HEART CATH AND CORONARY ANGIOGRAPHY;  Surgeon: Leonie Man, MD;  Location: [place] [facility];  Service: Cardiovascular;; Severe AS, mean gradient 50 mmHg P-P 55 mmHg (estimated AVA 0.76 cm).  Mildly elevated pulmonary pressures with elevated LVEDP and PCWP.  Angiographically normal coronary arteries, but tortuous.  . ROBOTIC ASSISTED TOTAL HYSTERECTOMY WITH BILATERAL SALPINGO OOPHERECTOMY Bilateral 10/08/2017   Procedure: XI ROBOTIC ASSISTED TOTAL HYSTERECTOMY WITH BILATERAL SALPINGO OOPHORECTOMY;  Surgeon: [name], MD;  Location: [facility];  Service: Gynecology;  Laterality: Bilateral;  . TEE WITHOUT CARDIOVERSION N/A 04/28/2019   Procedure: TRANSESOPHAGEAL ECHOCARDIOGRAM (TEE);  Surgeon: Burnell Blanks, MD;  Location: [place] [facility];  Service: Open Heart Surgery;  Laterality: N/A;  . THUMB ARTHROSCOPY  1999  .  TONSILLECTOMY  [id number]   74 years old  . TRANSCATHETER AORTIC VALVE REPLACEMENT, TRANSFEMORAL N/A 04/28/2019   Procedure: TRANSCATHETER AORTIC VALVE REPLACEMENT, TRANSFEMORAL;  Surgeon: Burnell Blanks, MD;  Location: [place]; ; 23 mm Sapien 3 Ultra THV via TF approach. (post-op mean AV gradient 13 mmHg)  . TRANSTHORACIC ECHOCARDIOGRAM  1/'19-2/'20   a) mild LVH, ef 60-65%, grade 2 DD, Mod AoV thickening /mild calcified w/ Mod AS/Mild AR (  valve area 1.15cm^2, mean gradiant 75mHg, peak grandient 469mg)/ mild LAE and RAE/ trivial TR; b) 08/2018: Mod LVH. Mod-Severe AS (mild AI) - mean gradient now 38 mmHg. Mild-Mod MAC. No MS.  --notable progression of disease  . TRANSTHORACIC ECHOCARDIOGRAM  02/17/2019   Mod LVH. GR 2 DD. Severe AS (mean gradient = 44 mmHg with Mod AI. mild MS.  . Marland KitchenRANSTHORACIC ECHOCARDIOGRAM  04/29/2019   a)  (post TAVR): EF 60-65%.  GRII DD.  Mod LA dilation & nl RA.  Mod MAC.  No  AI (paravalvular leak-PVL), mean AV gradient 13 mmHg.;; b) EF 60 to 65%.  Severely increased thickness/LVH.  GRII DD w/ only mildly elevated LAP.  NL RA size.  Severe MAC with only trace MR.  No MS.  TAVR valve prosthesis functioning well.  Trivial AI.  Mean gradient 15 mmHg with a peak 29.8 mmHg. -- STABLE  . TUBAL LIGATION    . UPPER GI ENDOSCOPY     normal  . WISDOM TOOTH EXTRACTION      Past Medical Hx:  Past Medical History:  Diagnosis Date  . Anxiety and depression 08/26/2007   Qualifier: Diagnosis of  By: ToGlori BickersD, MaCarmell [place] . Arthritis    knees  . Constipation, slow transit 11/03/2010  . Degenerative disk disease 08/31/2011  . Depression with anxiety   . DERMATITIS, SEBORRHEIC 08/25/2010   Qualifier: Diagnosis of  By: ToGlori BickersD, MaCarmell [place] . [facility]ometrial [facility] ([facility]   02/ 2019  Diagnosed with D&C/hysteroscopy with polypectomy  . Essential hypertension 08/29/2017  . Fibromyalgia    tx mobic  . Full dentures   . GERD 08/26/2007   Qualifier: Diagnosis of  By: ToGlori BickersD,  MaCarmell [place] . Gout   . Hearing loss    wears bilateral hearing aids  . History of colon polyps   . History of nonmelanoma skin cancer    right lower leg  . History of poliomyelitis 08/26/2007   Qualifier: History of  By: ToGlori BickersD, MaCarmell [place] . Hyperlipidemia, mild 09/02/2012  . Hypertension   . Hypokalemia 01/02/2011  . IBS (irritable bowel syndrome)    with constipation  . Obesity 08/26/2007   Qualifier: Diagnosis of  By: ToGlori BickersD, MaCarmell [place] . OSTEOARTHRITIS, HANDS, BILATERAL 08/26/2007   Qualifier: Diagnosis of  By: ToGlori BickersD, MaCarmell [place] . Osteopenia   . OVERACTIVE BLADDER 08/26/2007   Qualifier: Diagnosis of  By: ToGlori BickersD, MaCarmell [place] . Personal history of colonic polyps 11/03/2010   05/2018 13 polyps mostly adenomas, ssp's - recall 1 year 2020 CaGatha MayerMD, [facility] . Poor balance 11/25/2018   With falls  Has rise in R shoe/post polio  Weak legs as well    . S/P TAVR (transcatheter aortic valve replacement)    s/p TAVR with a 2352mdwards S3U via the TF approach by Dr. BarCyndia Bentd Dr. McAAngelena Form Severe calcific aortic stenosis 08/27/2017    -> Became severe as of October 2020-referred for TAVR on 04/28/2019  . URINARY INCONTINENCE 08/26/2007   Qualifier: Diagnosis of  By: TowGlori Bickers, MarCarmell [place]. Wears hearing aid in both ears     Past Gynecological History:  Endometrial cancer. No LMP recorded. Patient has had a hysterectomy.  Family Hx:  Family History  Problem Relation Age of Onset  . Kidney disease Mother   . Alzheimer's disease Mother   .  Stroke Father 93  . Colon cancer Father 60       possibly colon cancer, possibly just polyps  . Kidney disease Sister   . Heart attack Sister   . Colon cancer Paternal Aunt 56  . Cancer Paternal Uncle        unk type  . Esophageal cancer Neg Hx   . Stomach cancer Neg Hx   . Rectal cancer Neg Hx     Review of Systems:  Constitutional  Feels well,    ENT Normal appearing ears and nares bilaterally Skin/Breast  No  rash, sores, jaundice, itching, dryness Cardiovascular  No chest pain, shortness of breath, or edema  Pulmonary  No cough or wheeze.  Gastro Intestinal  No nausea, vomitting, or diarrhoea. No bright red blood per rectum, no change in bowel movement, or constipation.  Genito Urinary  No frequency, urgency, dysuria, no postmenopausal bleeding. Musculo Skeletal  No myalgia, arthralgia, joint swelling or pain  Neurologic  No weakness, numbness, change in gait,  Psychology  No depression, anxiety, insomnia.   Vitals:  Blood pressure (!) 151/55, pulse 75, temperature 98.4 F (36.9 C), temperature source Oral, resp. rate 17, height 5' 4" (1.626 m), weight 185 lb (83.9 kg), SpO2 99 %.  Physical Exam: WD in NAD Neck  Supple NROM, without any enlargements.  Lymph Node Survey No cervical supraclavicular or inguinal adenopathy Cardiovascular  Pulse normal rate, regularity and rhythm.  Systolic murmur.  Lungs  Clear to auscultation bilateraly, without wheezes/crackles/rhonchi. Good air movement.  Skin  No rash/lesions/breakdown  Psychiatry  Alert and oriented to person, place, and time  Abdomen  Normoactive bowel sounds, abdomen soft, non-tender and obese without evidence of hernia. Well healed incisions.  Back No CVA tenderness Genito Urinary  External genitalia normal. Vaginal cuff healed, no lesions or masses visible or palpable.  Rectal  deferred Extremities  No bilateral cyanosis, clubbing or edema.  Thereasa Solo, MD  12/16/2019, 2:35 PM

## 2020-01-23 ENCOUNTER — Other Ambulatory Visit: Payer: Self-pay | Admitting: Cardiology

## 2020-01-25 ENCOUNTER — Other Ambulatory Visit: Payer: Self-pay

## 2020-01-25 ENCOUNTER — Other Ambulatory Visit: Payer: Self-pay | Admitting: Family Medicine

## 2020-01-25 DIAGNOSIS — Z1231 Encounter for screening mammogram for malignant neoplasm of breast: Secondary | ICD-10-CM

## 2020-01-25 DIAGNOSIS — I350 Nonrheumatic aortic (valve) stenosis: Secondary | ICD-10-CM

## 2020-01-25 DIAGNOSIS — Z952 Presence of prosthetic heart valve: Secondary | ICD-10-CM

## 2020-01-26 ENCOUNTER — Other Ambulatory Visit: Payer: Self-pay | Admitting: Family Medicine

## 2020-01-27 NOTE — Telephone Encounter (Signed)
CPE is scheduled for 11/29/20, med is listed as a historical entry but last filled by Dr. Glori Bickers on 12/29/18 #90 tabs with 3 refills

## 2020-02-17 ENCOUNTER — Encounter: Payer: Self-pay | Admitting: Cardiology

## 2020-02-17 ENCOUNTER — Ambulatory Visit: Payer: PPO | Admitting: Cardiology

## 2020-02-17 ENCOUNTER — Other Ambulatory Visit: Payer: Self-pay

## 2020-02-17 VITALS — BP 128/62 | HR 66 | Temp 97.2°F | Ht 65.0 in | Wt 178.0 lb

## 2020-02-17 DIAGNOSIS — R609 Edema, unspecified: Secondary | ICD-10-CM | POA: Diagnosis not present

## 2020-02-17 DIAGNOSIS — E782 Mixed hyperlipidemia: Secondary | ICD-10-CM

## 2020-02-17 DIAGNOSIS — Z952 Presence of prosthetic heart valve: Secondary | ICD-10-CM

## 2020-02-17 DIAGNOSIS — I10 Essential (primary) hypertension: Secondary | ICD-10-CM

## 2020-02-17 DIAGNOSIS — R600 Localized edema: Secondary | ICD-10-CM | POA: Diagnosis not present

## 2020-02-17 DIAGNOSIS — I350 Nonrheumatic aortic (valve) stenosis: Secondary | ICD-10-CM | POA: Diagnosis not present

## 2020-02-17 MED ORDER — FUROSEMIDE 20 MG PO TABS: ORAL_TABLET | ORAL | 11 refills | Status: DC

## 2020-02-17 NOTE — Progress Notes (Signed)
Primary Care Provider: Abner Greenspan, MD Cardiologist: Veronica Hew, MD Electrophysiologist: None  TAVR Cardiologist: Dr Veronica Greene Veronica Greene)  Clinic Note: Chief Complaint  Patient presents with  . Follow-up    Had a recent fall.  Otherwise no major cardiac issues.  Mild swelling  . Cardiac Valve Problem    Status post TAVR October 2020    HPI:    Veronica Greene is a 74 y.o. female with a PMH below who presents today for routine follow-up after her TAVR last fall.Veronica Greene was last seen on by me prior to her preop/TAVR right and left heart catheterization.    May 21, 2019 for post TAVR follow-up by Veronica Form, PA.  BP up slightly. Definitely noted less dyspnea and fatigue since TAVR. ->  Discussed SBE prophylaxis.  Plan ASA/Plavix until 10/27/2019.    Was switched over to Protonix because of Plavix ->   Was started on Norvasc 5 mg daily because of hypertension.  Asked to avoid Mobic while on DAPT  05/20/2020  - TTE: EF 60 to 65%.  Severely increased thickness/LVH.  GRII DD with only mildly elevated left atrial pressure.  Normal RA size.  Severe MAC with only trace MR.  No MS.  TAVR valve prosthesis functioning well.  Trivial AI.  Mean gradient 15 mmHg with a peak 29.8 mmHg.  Stable.  I last saw her in on August 26, 2019 -> she was doing fairly well.  No further episodes of fast heart rate.  Was having some balance issues doing the orthotic shoe.  Major complaint was notably worse GERD on Protonix versus Nexium.  This led to her having to sit up to sleep -> plan to switch back to Nexium once Plavix discontinued.  Recent Hospitalizations: None since TAVR  Reviewed  CV studies:    The following studies were reviewed today: (if available, images/films reviewed: From Epic Chart or Care Everywhere) last 1 involved  Due in September   Interval History:   Veronica Greene returns here today overall doing okay.  She had cataract  surgery recently and has had a couple spells where her eyes gone blurry.  1 spell was associated with dizziness and blurred vision the second 1 was dizziness without blurred vision.  Has not had any other issues. Major issue today is that she fell last week.  They were down at the [place], and she was cleaning up their boat.  She turned around and tripped on a hose and fell backwards on her right hip.  She has significant pain in her right hip. She not had any further palpitation spells. She does note some right > left leg swelling that is more related to her orthopedic issues.  She really has poor balance with off and on dizziness, but no orthostatic symptoms.  Currently as result of this, she is not very active as far as walking.  She is active, but not necessarily exercise.  Doing relatively well from a cardiac standpoint, with no major issues.  CV Review of Symptoms (Summary): no chest pain or dyspnea on exertion positive for - edema and More related to her orthopedic issues, also dizziness and poor balance. negative for - irregular heartbeat, orthopnea, palpitations, paroxysmal nocturnal dyspnea, rapid heart rate, shortness of breath or Syncope/near syncope, TIA/amaurosis fugax, claudication  The patient DOES NOT have symptoms concerning for COVID-19 infection (fever, chills, cough, or new shortness of breath).  The patient is practicing social distancing & Masking.  REVIEWED OF SYSTEMS   Review of Systems  Constitutional: Negative for malaise/fatigue and weight loss.  HENT: Negative for nosebleeds.   Respiratory: Negative for shortness of breath.   Cardiovascular:       Since that one spell of increased heart rate and blood pressure in the setting of a stressful situation, not having any further symptoms.  Gastrointestinal: Negative for blood in stool and melena.  Genitourinary: Negative for hematuria.  Musculoskeletal: Positive for falls (hit her R hip/back -- huge bruise) and joint  pain.       Having more issues now with her orthotic shoe and balance.  Neurological: Negative for dizziness and headaches.       Poor balance, may be more related to orthopedic issues.  Endo/Heme/Allergies: Does not bruise/bleed easily (big hip bruise from fall).  Psychiatric/Behavioral: Negative for depression.    I have reviewed and (if needed) personally updated the patient's problem list, medications, allergies, past medical and surgical history, social and family history.   PAST MEDICAL HISTORY   Past Medical History:  Diagnosis Date  . Anxiety and depression 08/26/2007   Qualifier: Diagnosis of  By: Veronica Bickers MD, Veronica Greene   . Arthritis    knees  . Constipation, slow transit 11/03/2010  . Degenerative disk disease 08/31/2011  . Depression with anxiety   . DERMATITIS, SEBORRHEIC 08/25/2010   Qualifier: Diagnosis of  By: Veronica Bickers MD, Veronica Greene   . Endometrial cancer Veronica Greene)    02/ 2019  Diagnosed with D&C/hysteroscopy with polypectomy  . Essential hypertension 08/29/2017  . Fibromyalgia    tx mobic  . Full dentures   . GERD 08/26/2007   Qualifier: Diagnosis of  By: Veronica Bickers MD, Veronica Greene   . Gout   . Hearing loss    wears bilateral hearing aids  . History of colon polyps   . History of nonmelanoma skin cancer    right lower leg  . History of poliomyelitis 08/26/2007   Qualifier: History of  By: Veronica Bickers MD, Veronica Greene   . Hyperlipidemia, mild 09/02/2012  . Hypertension   . Hypokalemia 01/02/2011  . IBS (irritable bowel syndrome)    with constipation  . Obesity 08/26/2007   Qualifier: Diagnosis of  By: Veronica Bickers MD, Veronica Greene   . OSTEOARTHRITIS, HANDS, BILATERAL 08/26/2007   Qualifier: Diagnosis of  By: Veronica Bickers MD, Veronica Greene   . Osteopenia   . OVERACTIVE BLADDER 08/26/2007   Qualifier: Diagnosis of  By: Veronica Bickers MD, Veronica Greene   . Personal history of colonic polyps 11/03/2010   05/2018 13 polyps mostly adenomas, ssp's - recall 1 year 2020 Veronica Mayer, MD, [facility]   . Poor balance 11/25/2018   With  falls  Has rise in R shoe/post polio  Weak legs as well    . S/P TAVR (transcatheter aortic valve replacement)    s/p TAVR with a 53m Edwards S3U via the TF approach by Dr. BCyndia Bentand Dr. MAngelena Greene . Severe calcific aortic stenosis 08/27/2017    -> Became severe as of October 2020-referred for TAVR on 04/28/2019  . URINARY INCONTINENCE 08/26/2007   Qualifier: Diagnosis of  By: TGlori BickersMD, MCarmell Greene  . Wears hearing aid in both ears     PAST SURGICAL HISTORY   Past Surgical History:  Procedure Laterality Date  . ANKLE SURGERY Right 1956  . CARPAL TUNNEL RELEASE Bilateral 1986, 1993  . CATARACT EXTRACTION Right 11/16/2019  . COLONOSCOPY N/A 06/24/2015   Procedure: COLONOSCOPY;  Surgeon: COfilia Neas  Carlean Purl, MD;  Location: [facility];  Service: Endoscopy;  Laterality: N/A;  . DILATATION & CURETTAGE/HYSTEROSCOPY WITH MYOSURE N/A 08/09/2017   Procedure: DILATATION & CURETTAGE/HYSTEROSCOPY WITH MYOSURE;  Surgeon: Christophe Louis, MD;  Location: [facility];  Service: Gynecology;  Laterality: N/A;  Polypectomy  . DILATION AND CURETTAGE OF UTERUS     PMB  . HAND SURGERY Bilateral 1999   Thumbs   . KNEE SURGERY Bilateral 1998   x2  . LYMPH NODE BIOPSY N/A 10/08/2017   Procedure: LYMPH NODE BIOPSY;  Surgeon: [name], MD;  Location: [facility];  Service: Gynecology;  Laterality: N/A;  . MULTIPLE TOOTH EXTRACTIONS    . NASAL SINUS SURGERY  1976  . polio surgery.     right foot - right leg 1.5" shorter than left leg - some wekness  . RIGHT/LEFT HEART CATH AND CORONARY ANGIOGRAPHY N/A 04/09/2019   Procedure: RIGHT/LEFT HEART CATH AND CORONARY ANGIOGRAPHY;  Surgeon: Leonie Man, MD;  Location: [facility];  Service: Cardiovascular;; Severe AS, mean gradient 50 mmHg P-P 55 mmHg (estimated AVA 0.76 cm).  Mildly elevated pulmonary pressures with elevated LVEDP and PCWP.  Angiographically normal coronary arteries, but tortuous.  . ROBOTIC ASSISTED TOTAL HYSTERECTOMY WITH BILATERAL SALPINGO OOPHERECTOMY  Bilateral 10/08/2017   Procedure: XI ROBOTIC ASSISTED TOTAL HYSTERECTOMY WITH BILATERAL SALPINGO OOPHORECTOMY;  Surgeon: [name], MD;  Location: [facility];  Service: Gynecology;  Laterality: Bilateral;  . TEE WITHOUT CARDIOVERSION N/A 04/28/2019   Procedure: TRANSESOPHAGEAL ECHOCARDIOGRAM (TEE);  Surgeon: Burnell Blanks, MD;  Location: [place] [facility];  Service: Open Heart Surgery;  Laterality: N/A;  . THUMB ARTHROSCOPY  1999  . TONSILLECTOMY  1445   74 years old  . TRANSCATHETER AORTIC VALVE REPLACEMENT, TRANSFEMORAL N/A 04/28/2019   Procedure: TRANSCATHETER AORTIC VALVE REPLACEMENT, TRANSFEMORAL;  Surgeon: Burnell Blanks, MD;  Location: [place] [facility]; ; 23 mm Sapien 3 Ultra THV via TF approach. (post-op mean AV gradient 13 mmHg)  . TRANSTHORACIC ECHOCARDIOGRAM  05/20/2020   (2nd post TAVR) EF 60 to 65%.  Severely increased thickness/LVH.  GRII DD with only mildly elevated left atrial pressure.  Normal RA size.  Severe MAC with only trace MR.  No MS.  TAVR valve prosthesis functioning well.  Trivial AI.  Mean gradient 15 mmHg with a peak 29.8 mmHg.  Stable.  . TRANSTHORACIC ECHOCARDIOGRAM  02/17/2019   Mod LVH. GR 2 DD. Severe AS (mean gradient = 44 mmHg with Mod AI. mild MS.  Marland Kitchen TRANSTHORACIC ECHOCARDIOGRAM  04/29/2019   a)  (post TAVR): EF 60-65%.  GRII DD.  Mod LA dilation & nl RA.  Mod MAC.  No  AI (paravalvular leak-PVL), mean AV gradient 13 mmHg.;; b) EF 60 to 65%.  Severely increased thickness/LVH.  GRII DD w/ only mildly elevated LAP.  NL RA size.  Severe MAC with only trace MR.  No MS.  TAVR valve prosthesis functioning well.  Trivial AI.  Mean gradient 15 mmHg with a peak 29.8 mmHg. -- STABLE  . TUBAL LIGATION    . UPPER GI ENDOSCOPY     normal  . WISDOM TOOTH EXTRACTION      MEDICATIONS/ALLERGIES   Current Meds  Medication Sig  . acetaminophen (TYLENOL) 325 MG tablet Take 650 mg by mouth every 6 (six) hours as needed (for pain.).  Marland Kitchen acetic  acid-hydrocortisone (ACETASOL HC) OTIC solution Place 2 drops into both ears 2 (two) times daily as needed (itching).  Marland Kitchen amLODipine (NORVASC) 5 MG tablet  Take 1 tablet (5 mg total) by mouth daily.  . Ascorbic Acid (VITAMIN C) 1000 MG tablet Take 1,000 mg by mouth daily with breakfast.   . aspirin EC 81 MG tablet Take 81 mg by mouth daily.  . Black Pepper-Turmeric (TURMERIC COMPLEX/BLACK PEPPER PO) Take 15 mLs by mouth daily with breakfast.   . Calcium-Phosphorus-Vitamin D (CALCIUM/VITAMIN D3/ADULT GUMMY PO) Take 1 tablet by mouth 2 (two) times daily.   . cetirizine (ZYRTEC) 10 MG tablet Take 5 mg by mouth as needed for allergies.   . cholecalciferol (VITAMIN D) 1000 UNITS tablet Take 1,000 Units by mouth daily with breakfast.   . D 1000 25 MCG (1000 UT) capsule 1 Add'l Sig oral Select Frequency  . fluticasone (FLONASE) 50 MCG/ACT nasal spray Place 2 sprays into both nostrils at bedtime.   . GUAIFENESIN 1200 PO Take 1,200 mg by mouth at bedtime.   . hydrochlorothiazide (HYDRODIURIL) 25 MG tablet Take 1 tablet (25 mg total) by mouth daily with lunch.  . lubiprostone (AMITIZA) 24 MCG capsule Take 1 capsule (24 mcg total) by mouth 2 (two) times daily. LUNCH & SUPPER  . meloxicam (MOBIC) 7.5 MG tablet TAKE 1 TABLET BY MOUTH AT BEDTIME.  . metoprolol succinate (TOPROL-XL) 25 MG 24 hr tablet TAKE 1 TABLET BY MOUTH DAILY.  . Multiple Vitamin (MULTIVITAMIN WITH MINERALS) TABS tablet Take 1 tablet by mouth daily.   Marland Kitchen NEXIUM 40 MG capsule 1 Add'l Sig oral Select Frequency  . pantoprazole (PROTONIX) 40 MG tablet Take 1 tablet (40 mg total) by mouth daily.  Vladimir Faster Glycol-Propyl Glycol (SYSTANE) 0.4-0.3 % SOLN Place 1-2 drops into both eyes 2 (two) times daily.   . potassium chloride (KLOR-CON) 10 MEQ tablet Take 2 tablets (20 mEq total) by mouth 2 (two) times daily.  . Simethicone (PHAZYME PO) Take 1 tablet by mouth at bedtime as needed (for gas).   . sodium chloride (AYR) 0.65 % nasal spray Place 1  spray into the nose daily.   Marland Kitchen venlafaxine XR (EFFEXOR XR) 75 MG 24 hr capsule Take 1 capsule (75 mg total) by mouth daily after breakfast.   Current Facility-Administered Medications for the 02/17/20 encounter (Office Visit) with Leonie Man, MD  Medication  . 0.9 %  sodium chloride infusion    Allergies  Allergen Reactions  . Clarithromycin Swelling and Other (See Comments)    Throat Swells  . Penicillins Hives and Other (See Comments)    WELTS Has patient had a PCN reaction causing immediate rash, facial/tongue/throat swelling, SOB or lightheadedness with hypotension: No Has patient had a PCN reaction causing severe rash involving mucus membranes or skin necrosis: Unknown Has patient had a PCN reaction that required hospitalization:No Has patient had a PCN reaction occurring within the last 10 years: No If all of the above answers are "NO", then may proceed with Cephalosporin use.   . Codeine Nausea And Vomiting    SOCIAL HISTORY/FAMILY HISTORY   Reviewed in Epic:  Pertinent findings: No new changes.  OBJCTIVE -PE, EKG, labs   Wt Readings from Last 3 Encounters:  02/17/20 178 lb (80.7 kg)  12/16/19 185 lb (83.9 kg)  11/26/19 182 lb 2 oz (82.6 kg)    Physical Exam: BP 128/62   Pulse 66   Temp (!) 97.2 F (36.2 C)   Ht _0  (1.651 m)   Wt 178 lb (80.7 kg)   SpO2 97%   BMI 29.62 kg/m  Physical Exam Vitals reviewed.  Constitutional:  General: She is not in acute distress.    Appearance: Normal appearance. She is well-developed.     Comments: Borderline obese  HENT:     Head: Normocephalic and atraumatic.  Neck:     Vascular: No carotid bruit or JVD.  Cardiovascular:     Rate and Rhythm: Normal rate and regular rhythm. Occasional extrasystoles are present.    Chest Wall: PMI is not displaced.     Pulses: Normal pulses and intact distal pulses.     Heart sounds: S1 normal and S2 normal. Murmur (1-2/6 SEM at RUSB.) heard.  Harsh crescendo-decrescendo  midsystolic murmur is present with a grade of 2/6 at the upper right sternal border radiating to the neck. Murmur is noticeably less prominent than last visit   No gallop.   Pulmonary:     Effort: Pulmonary effort is normal. No respiratory distress.     Breath sounds: Normal breath sounds. No wheezing or rales.  Abdominal:     General: Abdomen is flat. Bowel sounds are normal.     Palpations: Abdomen is soft.     Tenderness: There is no abdominal tenderness. There is no rebound.  Musculoskeletal:        General: Deformity (Post polio-right leg shorter than left.  Has orthotic shoe, but still has imbalance issues.) present. Normal range of motion.     Cervical back: Normal range of motion.  Neurological:     General: No focal deficit present.     Mental Status: She is alert and oriented to person, place, and time.  Psychiatric:        Mood and Affect: Mood normal.        Behavior: Behavior normal.        Thought Content: Thought content normal.        Judgment: Judgment normal.      Adult ECG Report  Rate: 66;  Rhythm: normal sinus rhythm and Borderline low voltage, otherwise normal axis, intervals & durations.  Narrative Interpretation: Stable EKG.  Recent Labs:    Lab Results  Component Value Date   CHOL 178 11/20/2019   HDL 46.00 11/20/2019   LDLCALC 111 (H) 11/20/2019   TRIG 107.0 11/20/2019   CHOLHDL 4 11/20/2019   Lab Results  Component Value Date   CREATININE 0.65 11/20/2019   BUN 20 11/20/2019   NA 138 11/20/2019   K 3.9 11/20/2019   CL 102 11/20/2019   CO2 29 11/20/2019   Lab Results  Component Value Date   TSH 1.98 11/20/2019    ASSESSMENT/PLAN    Problem List Items Addressed This Visit    Severe calcific aortic stenosis (Chronic)    Now status post TAVR in October 2020--is due for follow-up echocardiogram.      Relevant Medications   furosemide (LASIX) 20 MG tablet   HLD (hyperlipidemia) (Chronic)    LDL 111.  Would like to continue 100.  Not  currently on statin, would like to avoid for fear of worsening muscle issues with her postpolio.  Discussed diet and exercise.      Relevant Medications   furosemide (LASIX) 20 MG tablet   Essential hypertension (Chronic)    Blood pressure stable on current meds.  With her having somewhat some dizzy issues I would not want to be more aggressive.  The plan will be to provide Lasix 20 mg which she will take twice a week in lieu of HCTZ, and otherwise as needed for edema in lieu of HCTZ.  Relevant Medications   furosemide (LASIX) 20 MG tablet   S/P TAVR (transcatheter aortic valve replacement) - Primary (Chronic)    Doing well.  Notably improved symptoms following TAVR.  Last echo was in November stable aortic valve gradient.  Follow-up echo has been ordered by TAVR team prior to follow-up in October with Veronica Form, PA        Relevant Medications   furosemide (LASIX) 20 MG tablet   Other Relevant Orders   EKG 12-Lead (Completed)   Lower extremity edema    She does have bilateral lower extremity edema.  At this time plan will be to replace daily HCTZ with Lasix 20 mg on 2 days out of the week and then other days as needed in lieu of HCTZ.       Other Visit Diagnoses    Swelling       Relevant Medications   furosemide (LASIX) 20 MG tablet   Other Relevant Orders   EKG 12-Lead (Completed)       COVID-19 Education: The signs and symptoms of COVID-19 were discussed with the patient and how to seek care for testing (follow up with PCP or arrange E-visit).   The importance of social distancing was discussed today.  I spent a total of 19 minutes with the patient. >  50% of the time was spent in direct patient consultation.  Additional time spent with chart review  / charting (studies, outside notes, etc):8 Total Time: 51mn   Current medicines are reviewed at length with the patient today.  (+/- concerns) none   Patient Instructions / Medication Changes & Studies &  Tests Ordered   Patient Instructions  Medication Instructions:  Lasix 20 mg ( furosemide) may take upt twice a week do not take HCTZ those days  *If you need a refill on your cardiac medications before your next appointment, please call your pharmacy*   Lab Work: Not needed   Testing/Procedures: Not needed  Follow-Up: [facility] you and your health needs are our priority.  As part of our continuing mission to provide you with exceptional heart care, we have created designated Provider Care Teams.  These Care Teams include your primary Cardiologist (physician) and Advanced Practice Providers (APPs -  Physician Assistants and Nurse Practitioners) who all work together to provide you with the care you need, when you need it.  We recommend signing up for the patient portal called "MyChart".  Sign up information is provided on this After Visit Summary.  MyChart is used to connect with patients for Virtual Visits (Telemedicine).  Patients are able to view lab/test results, encounter notes, upcoming appointments, etc.  Non-urgent messages can be sent to your provider as well.   To learn more about what you can do with MyChart, go to [email or web address]    Your next appointment:   8 month(s) April  2022  The format for your next appointment:   In Person  Provider:   DGlenetta Hew MD       Studies Ordered:   Orders Placed This Encounter  Procedures  . EKG 12-Lead     DGlenetta Greene M.D., M.S. Interventional Cardiologist   Pager # [phone] # [phone] [address] [place] [id number]  Thank you for choosing [facility] at [facility]!

## 2020-02-17 NOTE — Patient Instructions (Signed)
Medication Instructions:  Lasix 20 mg ( furosemide) may take upt twice a week do not take HCTZ those days  *If you need a refill on your cardiac medications before your next appointment, please call your pharmacy*   Lab Work: Not needed   Testing/Procedures: Not needed  Follow-Up: At [facility], you and your health needs are our priority.  As part of our continuing mission to provide you with exceptional heart care, we have created designated Provider Care Teams.  These Care Teams include your primary Cardiologist (physician) and Advanced Practice Providers (APPs -  Physician Assistants and Nurse Practitioners) who all work together to provide you with the care you need, when you need it.  We recommend signing up for the patient portal called "MyChart".  Sign up information is provided on this After Visit Summary.  MyChart is used to connect with patients for Virtual Visits (Telemedicine).  Patients are able to view lab/test results, encounter notes, upcoming appointments, etc.  Non-urgent messages can be sent to your provider as well.   To learn more about what you can do with MyChart, go to [email or web address].    Your next appointment:   8 month(s) April  2022  The format for your next appointment:   In Person  Provider:   Glenetta Hew, MD

## 2020-02-21 ENCOUNTER — Encounter: Payer: Self-pay | Admitting: Cardiology

## 2020-02-21 DIAGNOSIS — R600 Localized edema: Secondary | ICD-10-CM | POA: Insufficient documentation

## 2020-02-21 NOTE — Assessment & Plan Note (Signed)
Blood pressure stable on current meds.  With her having somewhat some dizzy issues I would not want to be more aggressive.  The plan will be to provide Lasix 20 mg which she will take twice a week in lieu of HCTZ, and otherwise as needed for edema in lieu of HCTZ.

## 2020-02-21 NOTE — Assessment & Plan Note (Signed)
She does have bilateral lower extremity edema.  At this time plan will be to replace daily HCTZ with Lasix 20 mg on 2 days out of the week and then other days as needed in lieu of HCTZ.

## 2020-02-21 NOTE — Assessment & Plan Note (Signed)
Now status post TAVR in October 2020--is due for follow-up echocardiogram.

## 2020-02-21 NOTE — Assessment & Plan Note (Signed)
Doing well.  Notably improved symptoms following TAVR.  Last echo was in November stable aortic valve gradient.  Follow-up echo has been ordered by TAVR team prior to follow-up in October with Angelena Form, PA

## 2020-02-21 NOTE — Assessment & Plan Note (Signed)
LDL 111.  Would like to continue 100.  Not currently on statin, would like to avoid for fear of worsening muscle issues with her postpolio.  Discussed diet and exercise.

## 2020-03-22 ENCOUNTER — Encounter: Payer: Self-pay | Admitting: Family Medicine

## 2020-03-22 ENCOUNTER — Ambulatory Visit (INDEPENDENT_AMBULATORY_CARE_PROVIDER_SITE_OTHER): Payer: PPO | Admitting: Family Medicine

## 2020-03-22 ENCOUNTER — Other Ambulatory Visit: Payer: Self-pay

## 2020-03-22 VITALS — BP 128/68 | HR 75 | Temp 97.7°F | Ht 65.0 in | Wt 185.4 lb

## 2020-03-22 DIAGNOSIS — E6609 Other obesity due to excess calories: Secondary | ICD-10-CM

## 2020-03-22 DIAGNOSIS — M7989 Other specified soft tissue disorders: Secondary | ICD-10-CM

## 2020-03-22 DIAGNOSIS — R600 Localized edema: Secondary | ICD-10-CM | POA: Diagnosis not present

## 2020-03-22 DIAGNOSIS — I10 Essential (primary) hypertension: Secondary | ICD-10-CM

## 2020-03-22 DIAGNOSIS — Z6830 Body mass index (BMI) 30.0-30.9, adult: Secondary | ICD-10-CM

## 2020-03-22 NOTE — Assessment & Plan Note (Addendum)
Right leg swelling/discomfort and heat  That leg is shorter with deformity from polio (may be more prone to venous insufficiency or phlebitis)   Much less swelling in L leg   Will order venous doppler to assess veins and r/o DVT  Watch for s/s of cellulitis Recommend elevation /cool compress  Support hose if tolerable

## 2020-03-22 NOTE — Assessment & Plan Note (Signed)
Discussed how this problem influences overall health and the risks it imposes  Reviewed plan for weight loss with lower calorie diet (via better food choices and also portion control or program like weight watchers) and exercise building up to or more than 30 minutes 5 days per week including some aerobic activity    

## 2020-03-22 NOTE — Assessment & Plan Note (Signed)
bp in fair control at this time  BP Readings from Last 1 Encounters:  03/22/20 128/68   No changes needed Most recent labs reviewed  Disc lifstyle change with low sodium diet and exercise

## 2020-03-22 NOTE — Progress Notes (Signed)
Subjective:    Patient ID: Veronica Greene, female    DOB: [birth year], 74 y.o.   MRN: [id number]  This visit occurred during the SARS-CoV-2 public health emergency.  Safety protocols were in place, including screening questions prior to the visit, additional usage of staff PPE, and extensive cleaning of exam room while observing appropriate contact time as indicated for disinfecting solutions.    HPI Pt presents for R leg/ankle symptoms   (red and swollen)   Wt Readings from Last 3 Encounters:  03/22/20 185 lb 7 oz (84.1 kg)  02/17/20 178 lb (80.7 kg)  12/16/19 185 lb (83.9 kg)   30.86 kg/m  Has had symptoms for about 3 mo Cut back salt  For edema - Dr Ellyn Hack added lasix 2 times per week (is also on HCTZ)   Right ankle is worse than the left  It gets tight and throbs a bit  She wears tall shoe on the R foot for post polio growth problem  No injury that she knows of  More redness than the L side also /and it feels warm  occ uses ice pack   Dependent- most of the time not swollen in am  Also some improvement with lasix   Elevates her feet when she sits Does not wear supp stockings  Balance is bad / does not walk well    She takes mustard at night to help with leg cramps and RLS  HTN bp is stable today  No cp or palpitations or headaches or edema  No side effects to medicines  BP Readings from Last 3 Encounters:  03/22/20 128/68  02/17/20 128/62  12/16/19 (!) 151/55     Pulse Readings from Last 3 Encounters:  03/22/20 75  02/17/20 66  12/16/19 75      She is covid immunized   Lab Results  Component Value Date   CREATININE 0.65 11/20/2019   BUN 20 11/20/2019   NA 138 11/20/2019   K 3.9 11/20/2019   CL 102 11/20/2019   CO2 29 11/20/2019    Patient Active Problem List   Diagnosis Date Noted  . Leg swelling 03/22/2020  . Lower extremity edema 02/21/2020  . Endometrial cancer ([place]) 09/21/2019  . S/P TAVR (transcatheter aortic valve  replacement)   . Poor balance 11/25/2018  . History of endometrial cancer 09/12/2017  . Essential hypertension 08/29/2017  . Severe calcific aortic stenosis 08/27/2017  . Routine general medical examination at a health care facility 10/15/2015  . HLD (hyperlipidemia) 09/02/2012  . Degenerative disk disease 08/31/2011  . Personal history of colonic polyps 11/03/2010  . Gout 08/25/2010  . DERMATITIS, SEBORRHEIC 08/25/2010  . Osteopenia 09/05/2007  . History of poliomyelitis 08/26/2007  . Obesity 08/26/2007  . Anxiety and depression 08/26/2007  . ALLERGIC RHINITIS 08/26/2007  . GERD 08/26/2007  . IBS 08/26/2007  . OVERACTIVE BLADDER 08/26/2007  . OSTEOARTHRITIS, HANDS, BILATERAL 08/26/2007  . URINARY INCONTINENCE 08/26/2007   Past Medical History:  Diagnosis Date  . Anxiety and depression 08/26/2007   Qualifier: Diagnosis of  By: Glori Bickers MD, Carmell Austria   . Arthritis    knees  . Constipation, slow transit 11/03/2010  . Degenerative disk disease 08/31/2011  . Depression with anxiety   . DERMATITIS, SEBORRHEIC 08/25/2010   Qualifier: Diagnosis of  By: Glori Bickers MD, Carmell Austria   . Endometrial cancer [facility])    02/ 2019  Diagnosed with D&C/hysteroscopy with polypectomy  . Essential hypertension 08/29/2017  . Fibromyalgia  tx mobic  . Full dentures   . GERD 08/26/2007   Qualifier: Diagnosis of  By: Glori Bickers MD, Carmell Austria   . Gout   . Hearing loss    wears bilateral hearing aids  . History of colon polyps   . History of nonmelanoma skin cancer    right lower leg  . History of poliomyelitis 08/26/2007   Qualifier: History of  By: Glori Bickers MD, Carmell Austria   . Hyperlipidemia, mild 09/02/2012  . Hypertension   . Hypokalemia 01/02/2011  . IBS (irritable bowel syndrome)    with constipation  . Obesity 08/26/2007   Qualifier: Diagnosis of  By: Glori Bickers MD, Carmell Austria   . OSTEOARTHRITIS, HANDS, BILATERAL 08/26/2007   Qualifier: Diagnosis of  By: Glori Bickers MD, Carmell Austria   . Osteopenia   . OVERACTIVE BLADDER  08/26/2007   Qualifier: Diagnosis of  By: Glori Bickers MD, Carmell Austria   . Personal history of colonic polyps 11/03/2010   05/2018 13 polyps mostly adenomas, ssp's - recall 1 year 2020 Gatha Mayer, MD, [facility]   . Poor balance 11/25/2018   With falls  Has rise in R shoe/post polio  Weak legs as well    . S/P TAVR (transcatheter aortic valve replacement)    s/p TAVR with a 39m Edwards S3U via the TF approach by Dr. BCyndia Bentand Dr. MAngelena Form . Severe calcific aortic stenosis 08/27/2017    -> Became severe as of October 2020-referred for TAVR on 04/28/2019  . URINARY INCONTINENCE 08/26/2007   Qualifier: Diagnosis of  By: TGlori BickersMD, MCarmell Austria  . Wears hearing aid in both ears    Past Surgical History:  Procedure Laterality Date  . ANKLE SURGERY Right 1956  . CARPAL TUNNEL RELEASE Bilateral 1986, 1993  . CATARACT EXTRACTION Right 11/16/2019  . COLONOSCOPY N/A 06/24/2015   Procedure: COLONOSCOPY;  Surgeon: CGatha Mayer MD;  Location: [facility];  Service: Endoscopy;  Laterality: N/A;  . DILATATION & CURETTAGE/HYSTEROSCOPY WITH MYOSURE N/A 08/09/2017   Procedure: DILATATION & CURETTAGE/HYSTEROSCOPY WITH MYOSURE;  Surgeon: CChristophe Louis MD;  [facility];  Service: Gynecology;  Laterality: N/A;  Polypectomy  . DILATION AND CURETTAGE OF UTERUS     PMB  . HAND SURGERY Bilateral 1999   Thumbs   . KNEE SURGERY Bilateral 1998   x2  . LYMPH NODE BIOPSY N/A 10/08/2017   Procedure: LYMPH NODE BIOPSY;  Surgeon: REveritt Amber MD;  Location: [facility];  Service: Gynecology;  Laterality: N/A;  . MULTIPLE TOOTH EXTRACTIONS    . NASAL SINUS SURGERY  1976  . polio surgery.     right foot - right leg 1.5" shorter than left leg - some wekness  . RIGHT/LEFT HEART CATH AND CORONARY ANGIOGRAPHY N/A 04/09/2019   Procedure: RIGHT/LEFT HEART CATH AND CORONARY ANGIOGRAPHY;  Surgeon: HLeonie Man MD;  [facility];  Service: Cardiovascular;; Severe AS, mean gradient 50 mmHg P-P 55 mmHg (estimated AVA 0.76  cm).  Mildly elevated pulmonary pressures with elevated LVEDP and PCWP.  Angiographically normal coronary arteries, but tortuous.  . ROBOTIC ASSISTED TOTAL HYSTERECTOMY WITH BILATERAL SALPINGO OOPHERECTOMY Bilateral 10/08/2017   Procedure: XI ROBOTIC ASSISTED TOTAL HYSTERECTOMY WITH BILATERAL SALPINGO OOPHORECTOMY;  Surgeon: REveritt Amber MD;  Location: [facility];  Service: Gynecology;  Laterality: Bilateral;  . TEE WITHOUT CARDIOVERSION N/A 04/28/2019   Procedure: TRANSESOPHAGEAL ECHOCARDIOGRAM (TEE);  Surgeon: MBurnell Blanks MD;  [facility];  Service: Open Heart Surgery;  Laterality: N/A;  .  THUMB ARTHROSCOPY  1999  . TONSILLECTOMY  7561   74 years old  . TRANSCATHETER AORTIC VALVE REPLACEMENT, TRANSFEMORAL N/A 04/28/2019   Procedure: TRANSCATHETER AORTIC VALVE REPLACEMENT, TRANSFEMORAL;  Surgeon: Burnell Blanks, MD;  Location: [facility]; ; 23 mm Sapien 3 Ultra THV via TF approach. (post-op mean AV gradient 13 mmHg)  . TRANSTHORACIC ECHOCARDIOGRAM  05/20/2020   (2nd post TAVR) EF 60 to 65%.  Severely increased thickness/LVH.  GRII DD with only mildly elevated left atrial pressure.  Normal RA size.  Severe MAC with only trace MR.  No MS.  TAVR valve prosthesis functioning well.  Trivial AI.  Mean gradient 15 mmHg with a peak 29.8 mmHg.  Stable.  . TRANSTHORACIC ECHOCARDIOGRAM  02/17/2019   Mod LVH. GR 2 DD. Severe AS (mean gradient = 44 mmHg with Mod AI. mild MS.  Marland Kitchen TRANSTHORACIC ECHOCARDIOGRAM  04/29/2019   a)  (post TAVR): EF 60-65%.  GRII DD.  Mod LA dilation & nl RA.  Mod MAC.  No  AI (paravalvular leak-PVL), mean AV gradient 13 mmHg.;; b) EF 60 to 65%.  Severely increased thickness/LVH.  GRII DD w/ only mildly elevated LAP.  NL RA size.  Severe MAC with only trace MR.  No MS.  TAVR valve prosthesis functioning well.  Trivial AI.  Mean gradient 15 mmHg with a peak 29.8 mmHg. -- STABLE  . TUBAL LIGATION    . UPPER GI ENDOSCOPY     normal  . WISDOM TOOTH  EXTRACTION     Social History   Tobacco Use  . Smoking status: Never Smoker  . Smokeless tobacco: Never Used  Vaping Use  . Vaping Use: Never used  Substance Use Topics  . Alcohol use: No    Alcohol/week: 0.0 standard drinks  . Drug use: No   Family History  Problem Relation Age of Onset  . Kidney disease Mother   . Alzheimer's disease Mother   . Stroke Father 30  . Colon cancer Father 80       possibly colon cancer, possibly just polyps  . Kidney disease Sister   . Heart attack Sister   . Colon cancer Paternal Aunt 6  . Cancer Paternal Uncle        unk type  . Esophageal cancer Neg Hx   . Stomach cancer Neg Hx   . Rectal cancer Neg Hx    Allergies  Allergen Reactions  . Clarithromycin Swelling and Other (See Comments)    Throat Swells  . Penicillins Hives and Other (See Comments)    WELTS Has patient had a PCN reaction causing immediate rash, facial/tongue/throat swelling, SOB or lightheadedness with hypotension: No Has patient had a PCN reaction causing severe rash involving mucus membranes or skin necrosis: Unknown Has patient had a PCN reaction that required hospitalization:No Has patient had a PCN reaction occurring within the last 10 years: No If all of the above answers are "NO", then may proceed with Cephalosporin use.   . Codeine Nausea And Vomiting   Current Outpatient Medications on File Prior to Visit  Medication Sig Dispense Refill  . acetaminophen (TYLENOL) 325 MG tablet Take 650 mg by mouth every 6 (six) hours as needed (for pain.).    Marland Kitchen acetic acid-hydrocortisone (ACETASOL HC) OTIC solution Place 2 drops into both ears 2 (two) times daily as needed (itching).    Marland Kitchen amLODipine (NORVASC) 5 MG tablet Take 1 tablet (5 mg total) by mouth daily. 90 tablet 3  . Ascorbic  Acid (VITAMIN C) 1000 MG tablet Take 1,000 mg by mouth daily with breakfast.     . aspirin EC 81 MG tablet Take 81 mg by mouth daily.    . Black Pepper-Turmeric (TURMERIC COMPLEX/BLACK  PEPPER PO) Take 15 mLs by mouth daily with breakfast.     . Calcium-Phosphorus-Vitamin D (CALCIUM/VITAMIN D3/ADULT GUMMY PO) Take 1 tablet by mouth 2 (two) times daily.     . cetirizine (ZYRTEC) 10 MG tablet Take 5 mg by mouth as needed for allergies.     . cholecalciferol (VITAMIN D) 1000 UNITS tablet Take 1,000 Units by mouth daily with breakfast.     . D 1000 25 MCG (1000 UT) capsule 1 Add'l Sig oral Select Frequency    . fluticasone (FLONASE) 50 MCG/ACT nasal spray Place 2 sprays into both nostrils at bedtime.     . furosemide (LASIX) 20 MG tablet May take 20 mg as needed up to twice a week  Do not take HCTZ that day 30 tablet 11  . GUAIFENESIN 1200 PO Take 1,200 mg by mouth at bedtime.     . hydrochlorothiazide (HYDRODIURIL) 25 MG tablet Take 1 tablet (25 mg total) by mouth daily with lunch. 90 tablet 3  . lubiprostone (AMITIZA) 24 MCG capsule Take 1 capsule (24 mcg total) by mouth 2 (two) times daily. LUNCH & SUPPER 180 capsule 3  . meloxicam (MOBIC) 7.5 MG tablet TAKE 1 TABLET BY MOUTH AT BEDTIME. 90 tablet 3  . metoprolol succinate (TOPROL-XL) 25 MG 24 hr tablet TAKE 1 TABLET BY MOUTH DAILY. 90 tablet 3  . Multiple Vitamin (MULTIVITAMIN WITH MINERALS) TABS tablet Take 1 tablet by mouth daily.     Marland Kitchen NEXIUM 40 MG capsule 1 Add'l Sig oral Select Frequency    . Polyethyl Glycol-Propyl Glycol (SYSTANE) 0.4-0.3 % SOLN Place 1-2 drops into both eyes 2 (two) times daily.     . potassium chloride (KLOR-CON) 10 MEQ tablet Take 2 tablets (20 mEq total) by mouth 2 (two) times daily. 360 tablet 3  . Simethicone (PHAZYME PO) Take 1 tablet by mouth at bedtime as needed (for gas).     . sodium chloride (AYR) 0.65 % nasal spray Place 1 spray into the nose daily.     Marland Kitchen venlafaxine XR (EFFEXOR XR) 75 MG 24 hr capsule Take 1 capsule (75 mg total) by mouth daily after breakfast. 90 capsule 3   Current Facility-Administered Medications on File Prior to Visit  Medication Dose Route Frequency Provider Last Rate  Last Admin  . 0.9 %  sodium chloride infusion  500 mL Intravenous Once Gatha Mayer, MD        Review of Systems  Constitutional: Negative for activity change, appetite change, fatigue, fever and unexpected weight change.  HENT: Negative for congestion, ear pain, rhinorrhea, sinus pressure and sore throat.   Eyes: Negative for pain, redness and visual disturbance.  Respiratory: Negative for cough, shortness of breath and wheezing.   Cardiovascular: Positive for leg swelling. Negative for chest pain and palpitations.  Gastrointestinal: Negative for abdominal pain, blood in stool, constipation and diarrhea.  Endocrine: Negative for polydipsia and polyuria.  Genitourinary: Negative for dysuria, frequency and urgency.  Musculoskeletal: Negative for arthralgias, back pain and myalgias.  Skin: Positive for color change. Negative for pallor and rash.  Allergic/Immunologic: Negative for environmental allergies.  Neurological: Negative for dizziness, syncope and headaches.       Poor balance  occ falls  Worsened by leg length discrepency  Hematological: Negative  for adenopathy. Does not bruise/bleed easily.  Psychiatric/Behavioral: Negative for decreased concentration and dysphoric mood. The patient is not nervous/anxious.        Objective:   Physical Exam Constitutional:      General: She is not in acute distress.    Appearance: Normal appearance. She is well-developed. She is obese. She is not ill-appearing or diaphoretic.  HENT:     Head: Normocephalic and atraumatic.  Eyes:     General: No scleral icterus.    Conjunctiva/sclera: Conjunctivae normal.     Pupils: Pupils are equal, round, and reactive to light.  Neck:     Thyroid: No thyromegaly.     Vascular: No carotid bruit or JVD.  Cardiovascular:     Rate and Rhythm: Normal rate and regular rhythm.     Pulses: Normal pulses.     Heart sounds: Murmur heard.  No gallop.      Comments: LE varicosities (small)  bilaterally Pulmonary:     Effort: Pulmonary effort is normal. No respiratory distress.     Breath sounds: Normal breath sounds. No wheezing or rales.  Abdominal:     General: Bowel sounds are normal. There is no distension or abdominal bruit.     Palpations: Abdomen is soft. There is no mass.     Tenderness: There is no abdominal tenderness.  Musculoskeletal:     Cervical back: Normal range of motion and neck supple.     Comments: RLE edema - mild pitting (plus one at most)  Tender in hamstring area  Some rubor below knee  No palp cords but varicosities noted Baseline leg deformity (underdeveloped leg and foot-shorter than the right)  Neg homan's sign  Lymphadenopathy:     Cervical: No cervical adenopathy.  Skin:    General: Skin is warm and dry.     Findings: Erythema present. No rash.     Comments: Some mild erythema of L shin with swelling  No wounds noted   Neurological:     Mental Status: She is alert.     Cranial Nerves: No cranial nerve deficit.     Sensory: No sensory deficit.     Motor: No weakness.     Coordination: Coordination normal.     Deep Tendon Reflexes: Reflexes are normal and symmetric. Reflexes normal.  Psychiatric:        Mood and Affect: Mood normal.           Assessment & Plan:   Problem List Items Addressed This Visit      Cardiovascular and Mediastinum   Essential hypertension (Chronic)    bp in fair control at this time  BP Readings from Last 1 Encounters:  03/22/20 128/68   No changes needed Most recent labs reviewed  Disc lifstyle change with low sodium diet and exercise          Other   Obesity    Discussed how this problem influences overall health and the risks it imposes  Reviewed plan for weight loss with lower calorie diet (via better food choices and also portion control or program like weight watchers) and exercise building up to or more than 30 minutes 5 days per week including some aerobic activity         Lower  extremity edema    Some improvement - moreso on L with lasix  R leg has more swelling that persists       Leg swelling - Primary    Right leg swelling/discomfort and heat  That leg is shorter with deformity from polio (may be more prone to venous insufficiency or phlebitis)   Much less swelling in L leg   Will order venous doppler to assess veins and r/o DVT  Watch for s/s of cellulitis Recommend elevation /cool compress  Support hose if tolerable         Relevant Orders   VAS [place] LOWER EXTREMITY VENOUS (DVT)

## 2020-03-22 NOTE — Assessment & Plan Note (Signed)
Some improvement - moreso on L with lasix  R leg has more swelling that persists

## 2020-03-22 NOTE — Patient Instructions (Signed)
I want to order a venous ultrasound of your leg to look for evidence of phlebitis or clot   The office will call you to set up the ultrasound    Elevate Ice (if hot)  Support socks - may be worth a try  Watch sodium intake

## 2020-03-23 DIAGNOSIS — M4802 Spinal stenosis, cervical region: Secondary | ICD-10-CM | POA: Diagnosis not present

## 2020-03-23 DIAGNOSIS — K219 Gastro-esophageal reflux disease without esophagitis: Secondary | ICD-10-CM | POA: Diagnosis not present

## 2020-03-23 DIAGNOSIS — E785 Hyperlipidemia, unspecified: Secondary | ICD-10-CM | POA: Diagnosis not present

## 2020-03-23 DIAGNOSIS — Z8542 Personal history of malignant neoplasm of other parts of uterus: Secondary | ICD-10-CM | POA: Diagnosis not present

## 2020-03-23 DIAGNOSIS — S199XXA Unspecified injury of neck, initial encounter: Secondary | ICD-10-CM | POA: Diagnosis not present

## 2020-03-23 DIAGNOSIS — Z8612 Personal history of poliomyelitis: Secondary | ICD-10-CM | POA: Diagnosis not present

## 2020-03-23 DIAGNOSIS — R519 Headache, unspecified: Secondary | ICD-10-CM | POA: Diagnosis not present

## 2020-03-23 DIAGNOSIS — Z7982 Long term (current) use of aspirin: Secondary | ICD-10-CM | POA: Diagnosis not present

## 2020-03-23 DIAGNOSIS — S0083XA Contusion of other part of head, initial encounter: Secondary | ICD-10-CM | POA: Diagnosis not present

## 2020-03-23 DIAGNOSIS — I10 Essential (primary) hypertension: Secondary | ICD-10-CM | POA: Diagnosis not present

## 2020-03-23 DIAGNOSIS — S0003XA Contusion of scalp, initial encounter: Secondary | ICD-10-CM | POA: Diagnosis not present

## 2020-03-23 DIAGNOSIS — M797 Fibromyalgia: Secondary | ICD-10-CM | POA: Diagnosis not present

## 2020-03-23 DIAGNOSIS — M2578 Osteophyte, vertebrae: Secondary | ICD-10-CM | POA: Diagnosis not present

## 2020-03-23 DIAGNOSIS — S0081XA Abrasion of other part of head, initial encounter: Secondary | ICD-10-CM | POA: Diagnosis not present

## 2020-03-23 DIAGNOSIS — M47812 Spondylosis without myelopathy or radiculopathy, cervical region: Secondary | ICD-10-CM | POA: Diagnosis not present

## 2020-03-28 ENCOUNTER — Ambulatory Visit (HOSPITAL_COMMUNITY)
Admission: RE | Admit: 2020-03-28 | Discharge: 2020-03-28 | Disposition: A | Discharge status: Home / Self Care | Payer: PPO | Source: Ambulatory Visit | Attending: Internal Medicine | Admitting: Internal Medicine

## 2020-03-28 ENCOUNTER — Other Ambulatory Visit: Payer: Self-pay

## 2020-03-28 DIAGNOSIS — M7989 Other specified soft tissue disorders: Secondary | ICD-10-CM | POA: Insufficient documentation

## 2020-03-28 DIAGNOSIS — R600 Localized edema: Secondary | ICD-10-CM

## 2020-03-30 ENCOUNTER — Other Ambulatory Visit: Payer: Self-pay

## 2020-03-30 ENCOUNTER — Encounter: Payer: Self-pay | Admitting: Family Medicine

## 2020-03-30 ENCOUNTER — Ambulatory Visit (INDEPENDENT_AMBULATORY_CARE_PROVIDER_SITE_OTHER): Payer: PPO | Admitting: Family Medicine

## 2020-03-30 VITALS — BP 126/62 | HR 70 | Temp 97.4°F | Ht 65.0 in | Wt 187.2 lb

## 2020-03-30 DIAGNOSIS — S0003XA Contusion of scalp, initial encounter: Secondary | ICD-10-CM | POA: Insufficient documentation

## 2020-03-30 DIAGNOSIS — S0003XS Contusion of scalp, sequela: Secondary | ICD-10-CM

## 2020-03-30 DIAGNOSIS — R2689 Other abnormalities of gait and mobility: Secondary | ICD-10-CM

## 2020-03-30 DIAGNOSIS — R296 Repeated falls: Secondary | ICD-10-CM | POA: Diagnosis not present

## 2020-03-30 DIAGNOSIS — R600 Localized edema: Secondary | ICD-10-CM

## 2020-03-30 NOTE — Patient Instructions (Addendum)
Use ice on your injuries  Walk carefully  Use walker if needed  Never walk with arms full   I placed a PT referral -our office will call you to set that up   If you develop headache /dizziness or other new symptoms let us know

## 2020-03-30 NOTE — Assessment & Plan Note (Signed)
Pt repeatedly trips on R leg (elevated shoe due to polio leg growth issue in the past)  With age-balance is worse and she falls more often  Head injury most recently as well as fall on R side with no sig inj today  Long disc re: fall avoidance  Using walker now  May need a new shoe re make per pt  Ref to PT for balance and gait tx

## 2020-03-30 NOTE — Assessment & Plan Note (Signed)
Multiple falls  She trips with R leg (shoe lift) and then falls repeatedly  Ref to PT for help with balance/gait training and strength

## 2020-03-30 NOTE — Assessment & Plan Note (Signed)
R leg remains more swollen than the L  Reviewed her venous US- very reassuring  Suggested support hose  Also did let her oncologist know (h/o endometrial cancer) and no further suggestions were made

## 2020-03-30 NOTE — Progress Notes (Signed)
Subjective:    Patient ID: Veronica Greene, female    DOB: 08/13/1945, 74 y.o.   MRN: [id number]  This visit occurred during the SARS-CoV-2 public health emergency.  Safety protocols were in place, including screening questions prior to the visit, additional usage of staff PPE, and extensive cleaning of exam room while observing appropriate contact time as indicated for disinfecting solutions.    HPI Pt presents for f/u of ED for a fall (has had several falls)  She was seen in ER in [place] on 9/15 after tripping and hitting head on sidewalk  Per pt her R shoe tripped  She was not using a cane or walker  (has a walker today) Sustained R frontal hematoma and abrasion  CT of head and CS were neg   Reports as follows: FINDINGS: CT HEAD FINDINGS  Brain: No evidence of acute infarction, hemorrhage, hydrocephalus, extra-axial collection or mass lesion/mass effect.  Vascular: No hyperdense vessel or unexpected calcification.  Skull: Normal. Negative for fracture or focal lesion.  Sinuses/Orbits: No acute finding.  Other: Right forehead scalp hematoma is noted.  CT CERVICAL SPINE FINDINGS  Alignment: Within normal limits.  Skull base and vertebrae: 7 cervical segments are well visualized. Vertebral body height is well maintained. Disc space narrowing is noted from C4-C7 with associated osteophytic changes. No acute fracture or acute facet abnormality is noted. Multilevel facet hypertrophic changes are seen.  Soft tissues and spinal canal: Surrounding soft tissue structures are within normal limits.  Upper chest: Within normal limits.  Other: None  IMPRESSION: CT of the head: Right frontal scalp hematoma.  CT of the cervical spine: Multilevel degenerative change without acute abnormality.   Electronically Signed By: Inez Catalina M.D. On  Today fell getting out to the truck to get here  Holding cane  [place] on R side Again her R shoe got hung in the  mud  Does not have any significant pain   Has done PT for balance in the past Would like to go back   No headaches or dizziness from the head injury  No trouble concentrating   Patient Active Problem List   Diagnosis Date Noted  . Multiple falls 03/30/2020  . Scalp hematoma 03/30/2020  . Lower extremity edema 02/21/2020  . Endometrial cancer ([place]) 09/21/2019  . S/P TAVR (transcatheter aortic valve replacement)   . Poor balance 11/25/2018  . History of endometrial cancer 09/12/2017  . Essential hypertension 08/29/2017  . Severe calcific aortic stenosis 08/27/2017  . Routine general medical examination at a health care facility 10/15/2015  . HLD (hyperlipidemia) 09/02/2012  . Degenerative disk disease 08/31/2011  . Personal history of colonic polyps 11/03/2010  . Gout 08/25/2010  . DERMATITIS, SEBORRHEIC 08/25/2010  . Osteopenia 09/05/2007  . History of poliomyelitis 08/26/2007  . Obesity 08/26/2007  . Anxiety and depression 08/26/2007  . ALLERGIC RHINITIS 08/26/2007  . GERD 08/26/2007  . IBS 08/26/2007  . OVERACTIVE BLADDER 08/26/2007  . OSTEOARTHRITIS, HANDS, BILATERAL 08/26/2007  . URINARY INCONTINENCE 08/26/2007   Past Medical History:  Diagnosis Date  . Anxiety and depression 08/26/2007   Qualifier: Diagnosis of  By: Glori Bickers MD, Carmell Austria   . Arthritis    knees  . Constipation, slow transit 11/03/2010  . Degenerative disk disease 08/31/2011  . Depression with anxiety   . DERMATITIS, SEBORRHEIC 08/25/2010   Qualifier: Diagnosis of  By: Glori Bickers MD, Carmell Austria   . Endometrial cancer [facility])    02/ 2019  Diagnosed  with D&C/hysteroscopy with polypectomy  . Essential hypertension 08/29/2017  . Fibromyalgia    tx mobic  . Full dentures   . GERD 08/26/2007   Qualifier: Diagnosis of  By: Glori Bickers MD, Carmell Austria   . Gout   . Hearing loss    wears bilateral hearing aids  . History of colon polyps   . History of nonmelanoma skin cancer    right lower leg  . History of  poliomyelitis 08/26/2007   Qualifier: History of  By: Glori Bickers MD, Carmell Austria   . Hyperlipidemia, mild 09/02/2012  . Hypertension   . Hypokalemia 01/02/2011  . IBS (irritable bowel syndrome)    with constipation  . Obesity 08/26/2007   Qualifier: Diagnosis of  By: Glori Bickers MD, Carmell Austria   . OSTEOARTHRITIS, HANDS, BILATERAL 08/26/2007   Qualifier: Diagnosis of  By: Glori Bickers MD, Carmell Austria   . Osteopenia   . OVERACTIVE BLADDER 08/26/2007   Qualifier: Diagnosis of  By: Glori Bickers MD, Carmell Austria   . Personal history of colonic polyps 11/03/2010   05/2018 13 polyps mostly adenomas, ssp's - recall 1 year 2020 Gatha Mayer, MD, [facility]   . Poor balance 11/25/2018   With falls  Has rise in R shoe/post polio  Weak legs as well    . S/P TAVR (transcatheter aortic valve replacement)    s/p TAVR with a 41m Edwards S3U via the TF approach by Dr. BCyndia Bentand Dr. MAngelena Form . Severe calcific aortic stenosis 08/27/2017    -> Became severe as of October 2020-referred for TAVR on 04/28/2019  . URINARY INCONTINENCE 08/26/2007   Qualifier: Diagnosis of  By: TGlori BickersMD, MCarmell Austria  . Wears hearing aid in both ears    Past Surgical History:  Procedure Laterality Date  . ANKLE SURGERY Right 1956  . CARPAL TUNNEL RELEASE Bilateral 1986, 1993  . CATARACT EXTRACTION Right 11/16/2019  . COLONOSCOPY N/A 06/24/2015   Procedure: COLONOSCOPY;  Surgeon: CGatha Mayer MD;  Location: [facility];  Service: Endoscopy;  Laterality: N/A;  . DILATATION & CURETTAGE/HYSTEROSCOPY WITH MYOSURE N/A 08/09/2017   Procedure: DILATATION & CURETTAGE/HYSTEROSCOPY WITH MYOSURE;  Surgeon: CChristophe Louis MD;  [facility];  Service: Gynecology;  Laterality: N/A;  Polypectomy  . DILATION AND CURETTAGE OF UTERUS     PMB  . HAND SURGERY Bilateral 1999   Thumbs   . KNEE SURGERY Bilateral 1998   x2  . LYMPH NODE BIOPSY N/A 10/08/2017   Procedure: LYMPH NODE BIOPSY;  Surgeon: REveritt Amber MD;  [facility]: [facility];  Service: Gynecology;  Laterality: N/A;  .  MULTIPLE TOOTH EXTRACTIONS    . NASAL SINUS SURGERY  1976  . polio surgery.     right foot - right leg 1.5" shorter than left leg - some wekness  . RIGHT/LEFT HEART CATH AND CORONARY ANGIOGRAPHY N/A 04/09/2019   Procedure: RIGHT/LEFT HEART CATH AND CORONARY ANGIOGRAPHY;  Surgeon: HLeonie Man MD;  [facility];  Service: Cardiovascular;; Severe AS, mean gradient 50 mmHg P-P 55 mmHg (estimated AVA 0.76 cm).  Mildly elevated pulmonary pressures with elevated LVEDP and PCWP.  Angiographically normal coronary arteries, but tortuous.  . ROBOTIC ASSISTED TOTAL HYSTERECTOMY WITH BILATERAL SALPINGO OOPHERECTOMY Bilateral 10/08/2017   Procedure: XI ROBOTIC ASSISTED TOTAL HYSTERECTOMY WITH BILATERAL SALPINGO OOPHORECTOMY;  Surgeon: REveritt Amber MD;  [facility]: [facility];  Service: Gynecology;  Laterality: Bilateral;  . TEE WITHOUT CARDIOVERSION N/A 04/28/2019   Procedure: TRANSESOPHAGEAL ECHOCARDIOGRAM (TEE);  Surgeon: MBurnell Blanks  MD;  Location: [facility];  Service: Open Heart Surgery;  Laterality: N/A;  . THUMB ARTHROSCOPY  1999  . TONSILLECTOMY  1619   74 years old  . TRANSCATHETER AORTIC VALVE REPLACEMENT, TRANSFEMORAL N/A 04/28/2019   Procedure: TRANSCATHETER AORTIC VALVE REPLACEMENT, TRANSFEMORAL;  Surgeon: Burnell Blanks, MD;  Location: [facility]; ; 23 mm Sapien 3 Ultra THV via TF approach. (post-op mean AV gradient 13 mmHg)  . TRANSTHORACIC ECHOCARDIOGRAM  05/20/2020   (2nd post TAVR) EF 60 to 65%.  Severely increased thickness/LVH.  GRII DD with only mildly elevated left atrial pressure.  Normal RA size.  Severe MAC with only trace MR.  No MS.  TAVR valve prosthesis functioning well.  Trivial AI.  Mean gradient 15 mmHg with a peak 29.8 mmHg.  Stable.  . TRANSTHORACIC ECHOCARDIOGRAM  02/17/2019   Mod LVH. GR 2 DD. Severe AS (mean gradient = 44 mmHg with Mod AI. mild MS.  Marland Kitchen TRANSTHORACIC ECHOCARDIOGRAM  04/29/2019   a)  (post TAVR): EF 60-65%.   GRII DD.  Mod LA dilation & nl RA.  Mod MAC.  No  AI (paravalvular leak-PVL), mean AV gradient 13 mmHg.;; b) EF 60 to 65%.  Severely increased thickness/LVH.  GRII DD w/ only mildly elevated LAP.  NL RA size.  Severe MAC with only trace MR.  No MS.  TAVR valve prosthesis functioning well.  Trivial AI.  Mean gradient 15 mmHg with a peak 29.8 mmHg. -- STABLE  . TUBAL LIGATION    . UPPER GI ENDOSCOPY     normal  . WISDOM TOOTH EXTRACTION     Social History   Tobacco Use  . Smoking status: Never Smoker  . Smokeless tobacco: Never Used  Vaping Use  . Vaping Use: Never used  Substance Use Topics  . Alcohol use: No    Alcohol/week: 0.0 standard drinks  . Drug use: No   Family History  Problem Relation Age of Onset  . Kidney disease Mother   . Alzheimer's disease Mother   . Stroke Father 54  . Colon cancer Father 58       possibly colon cancer, possibly just polyps  . Kidney disease Sister   . Heart attack Sister   . Colon cancer Paternal Aunt 104  . Cancer Paternal Uncle        unk type  . Esophageal cancer Neg Hx   . Stomach cancer Neg Hx   . Rectal cancer Neg Hx    Allergies  Allergen Reactions  . Clarithromycin Swelling and Other (See Comments)    Throat Swells  . Penicillins Hives and Other (See Comments)    WELTS Has patient had a PCN reaction causing immediate rash, facial/tongue/throat swelling, SOB or lightheadedness with hypotension: No Has patient had a PCN reaction causing severe rash involving mucus membranes or skin necrosis: Unknown Has patient had a PCN reaction that required hospitalization:No Has patient had a PCN reaction occurring within the last 10 years: No If all of the above answers are "NO", then may proceed with Cephalosporin use.   . Codeine Nausea And Vomiting   Current Outpatient Medications on File Prior to Visit  Medication Sig Dispense Refill  . acetaminophen (TYLENOL) 325 MG tablet Take 650 mg by mouth every 6 (six) hours as needed (for  pain.).    Marland Kitchen acetic acid-hydrocortisone (ACETASOL HC) OTIC solution Place 2 drops into both ears 2 (two) times daily as needed (itching).    Marland Kitchen amLODipine (NORVASC)  5 MG tablet Take 1 tablet (5 mg total) by mouth daily. 90 tablet 3  . Ascorbic Acid (VITAMIN C) 1000 MG tablet Take 1,000 mg by mouth daily with breakfast.     . aspirin EC 81 MG tablet Take 81 mg by mouth daily.    . Black Pepper-Turmeric (TURMERIC COMPLEX/BLACK PEPPER PO) Take 15 mLs by mouth daily with breakfast.     . Calcium-Phosphorus-Vitamin D (CALCIUM/VITAMIN D3/ADULT GUMMY PO) Take 1 tablet by mouth 2 (two) times daily.     . cetirizine (ZYRTEC) 10 MG tablet Take 5 mg by mouth as needed for allergies.     . cholecalciferol (VITAMIN D) 1000 UNITS tablet Take 1,000 Units by mouth daily with breakfast.     . D 1000 25 MCG (1000 UT) capsule 1 Add'l Sig oral Select Frequency    . fluticasone (FLONASE) 50 MCG/ACT nasal spray Place 2 sprays into both nostrils at bedtime.     . furosemide (LASIX) 20 MG tablet May take 20 mg as needed up to twice a week  Do not take HCTZ that day 30 tablet 11  . GUAIFENESIN 1200 PO Take 1,200 mg by mouth at bedtime.     . hydrochlorothiazide (HYDRODIURIL) 25 MG tablet Take 1 tablet (25 mg total) by mouth daily with lunch. 90 tablet 3  . lubiprostone (AMITIZA) 24 MCG capsule Take 1 capsule (24 mcg total) by mouth 2 (two) times daily. LUNCH & SUPPER 180 capsule 3  . meloxicam (MOBIC) 7.5 MG tablet TAKE 1 TABLET BY MOUTH AT BEDTIME. 90 tablet 3  . metoprolol succinate (TOPROL-XL) 25 MG 24 hr tablet TAKE 1 TABLET BY MOUTH DAILY. 90 tablet 3  . Multiple Vitamin (MULTIVITAMIN WITH MINERALS) TABS tablet Take 1 tablet by mouth daily.     Marland Kitchen NEXIUM 40 MG capsule 1 Add'l Sig oral Select Frequency    . Polyethyl Glycol-Propyl Glycol (SYSTANE) 0.4-0.3 % SOLN Place 1-2 drops into both eyes 2 (two) times daily.     . potassium chloride (KLOR-CON) 10 MEQ tablet Take 2 tablets (20 mEq total) by mouth 2 (two) times  daily. 360 tablet 3  . Simethicone (PHAZYME PO) Take 1 tablet by mouth at bedtime as needed (for gas).     . sodium chloride (AYR) 0.65 % nasal spray Place 1 spray into the nose daily.     Marland Kitchen venlafaxine XR (EFFEXOR XR) 75 MG 24 hr capsule Take 1 capsule (75 mg total) by mouth daily after breakfast. 90 capsule 3   Current Facility-Administered Medications on File Prior to Visit  Medication Dose Route Frequency Provider Last Rate Last Admin  . 0.9 %  sodium chloride infusion  500 mL Intravenous Once Gatha Mayer, MD        Review of Systems  Constitutional: Negative for activity change, appetite change, fatigue, fever and unexpected weight change.  HENT: Negative for congestion, ear pain, rhinorrhea, sinus pressure and sore throat.   Eyes: Negative for pain, redness and visual disturbance.  Respiratory: Negative for cough, shortness of breath and wheezing.   Cardiovascular: Negative for chest pain and palpitations.  Gastrointestinal: Negative for abdominal pain, blood in stool, constipation and diarrhea.  Endocrine: Negative for polydipsia and polyuria.  Genitourinary: Negative for dysuria, frequency and urgency.  Musculoskeletal: Negative for arthralgias, back pain and myalgias.       R leg shorter and malformed from past polio Now causing falls   Skin: Negative for pallor and rash.  Allergic/Immunologic: Negative for environmental allergies.  Neurological: Positive for weakness. Negative for dizziness, syncope, facial asymmetry, light-headedness and headaches.       Generalized weakness and poor balance  Multiple falls  Hematological: Negative for adenopathy. Does not bruise/bleed easily.  Psychiatric/Behavioral: Negative for decreased concentration and dysphoric mood. The patient is not nervous/anxious.        Objective:   Physical Exam Constitutional:      General: She is not in acute distress.    Appearance: Normal appearance. She is well-developed. She is obese. She is not  ill-appearing.  HENT:     Head: Normocephalic.     Comments: Large hematoma R forehead/mildly tender  Moderate ecchymosis around both eyes down to cheeks     Right Ear: External ear normal.     Left Ear: External ear normal.     Nose: Nose normal.     Mouth/Throat:     Pharynx: No oropharyngeal exudate.  Eyes:     General: No scleral icterus.       Right eye: No discharge.        Left eye: No discharge.     Conjunctiva/sclera: Conjunctivae normal.     Pupils: Pupils are equal, round, and reactive to light.     Comments: No nystagmus  Neck:     Thyroid: No thyromegaly.     Vascular: No carotid bruit or JVD.     Trachea: No tracheal deviation.  Cardiovascular:     Rate and Rhythm: Normal rate and regular rhythm.     Heart sounds: Murmur heard.   Pulmonary:     Effort: Pulmonary effort is normal. No respiratory distress.     Breath sounds: Normal breath sounds. No wheezing or rales.  Abdominal:     General: Bowel sounds are normal. There is no distension.     Palpations: Abdomen is soft. There is no mass.     Tenderness: There is no abdominal tenderness.  Musculoskeletal:        General: No tenderness.     Cervical back: Full passive range of motion without pain, normal range of motion and neck supple.  Lymphadenopathy:     Cervical: No cervical adenopathy.  Skin:    General: Skin is warm and dry.     Coloration: Skin is not pale.     Findings: Bruising present. No rash.  Neurological:     Mental Status: She is alert and oriented to person, place, and time.     Cranial Nerves: No cranial nerve deficit, dysarthria or facial asymmetry.     Sensory: No sensory deficit.     Motor: No tremor, atrophy or abnormal muscle tone.     Coordination: Coordination normal.     Gait: Gait abnormal.     Deep Tendon Reflexes: Reflexes are normal and symmetric.     Comments: No focal cerebellar signs   Gait is affected by R leg length discrepancy and poor balance  No focal weakness     Psychiatric:        Behavior: Behavior normal.        Thought Content: Thought content normal.           Assessment & Plan:   Problem List Items Addressed This Visit      Other   Poor balance    Multiple falls  She trips with R leg (shoe lift) and then falls repeatedly  Ref to PT for help with balance/gait training and strength       Relevant Orders  Ambulatory referral to Physical Therapy   Lower extremity edema    R leg remains more swollen than the L  Reviewed her venous US- very reassuring  Suggested support hose  Also did let her oncologist know (h/o endometrial cancer) and no further suggestions were made        Multiple falls    Pt repeatedly trips on R leg (elevated shoe due to polio leg growth issue in the past)  With age-balance is worse and she falls more often  Head injury most recently as well as fall on R side with no sig inj today  Long disc re: fall avoidance  Using walker now  May need a new shoe re make per pt  Ref to PT for balance and gait tx      Relevant Orders   Ambulatory referral to Physical Therapy   Scalp hematoma - Primary    From recent head injury after fall Reviewed hospital records, lab results and studies in detail   Reassuring CT of head and cs  No concussion symptoms Disc what to watch for  Bruising will improve with time Hematoma will take time to go down  Ice advised as needed

## 2020-03-30 NOTE — Assessment & Plan Note (Signed)
From recent head injury after fall Reviewed hospital records, lab results and studies in detail   Reassuring CT of head and cs  No concussion symptoms Disc what to watch for  Bruising will improve with time Hematoma will take time to go down  Ice advised as needed

## 2020-03-31 ENCOUNTER — Telehealth: Payer: Self-pay | Admitting: *Deleted

## 2020-03-31 DIAGNOSIS — Z8542 Personal history of malignant neoplasm of other parts of uterus: Secondary | ICD-10-CM

## 2020-03-31 DIAGNOSIS — R600 Localized edema: Secondary | ICD-10-CM

## 2020-03-31 DIAGNOSIS — I10 Essential (primary) hypertension: Secondary | ICD-10-CM

## 2020-03-31 NOTE — Telephone Encounter (Signed)
It looks like she already takes hctz and has furosemide on her list for prn use  I do not have anything else to add pharmacologically (also since the swelling is in one leg only)   Compression/support hose may help as we discussed   Who is her gyn? Does she want last note sent?  Thanks

## 2020-03-31 NOTE — Telephone Encounter (Signed)
Pt left VM at Triage. Pt said she is still having leg swelling she is aware DVT was negative but since she is still having leg issues/ swelling she wants to know what she can take or if PCP can prescribe her something, or what she should do about leg swelling. Pt also wanted to make sure PCP is sending her message/ office notes to her gyn

## 2020-04-01 NOTE — Telephone Encounter (Signed)
Pt notified of Dr. Marliss Coots comments. Pt will await Dr. Serita Grit input on leg.  Pt said her leg is warm and a little red and a little warm to the touch but she isn't sure if it's from her recent falls or not but she said it actually looks a little better today. Pt said her leg is still painful but it's still stiff/tight in the morning and her shin is store. Pt said he used tylenol, she has been elevating it and icing it for a while. Pt was able to walk more today with walker so she is okay with waiting for Dr. Serita Grit input

## 2020-04-01 NOTE — Telephone Encounter (Signed)
Pt notified of Dr. Marliss Coots comments. Pt wanted her derm doc to know what's going on not GYN, last OV sent to Dr. Ledell Peoples office and pt aware.  Pt would like Dr. Glori Bickers to talk to her to tell her "why" her leg keeps swelling. She knows what Dr. Glori Bickers told her to do to help the swelling (hoses) but she doesn't understand why it's swelling. Pt also concerned because usually that leg is very cold but it's also warm to the touch now. She knows we ruled out DVT but with her history of cancer she is very concerned.   Dr. Glori Bickers please call pt at [phone]

## 2020-04-01 NOTE — Telephone Encounter (Signed)
Let her know I did send her final report /doppler to Dr Denman George and asked if she thinks we need to do any additional imaging in light of cancer history. That was yesterday and I have not heard back yet   As I told her before-the anatomy of that leg is different and that may be one reason why it swells more.   She said leg is warm-is is becoming more red?  Any wounds

## 2020-04-05 NOTE — Telephone Encounter (Signed)
I placed the order - noted pref in [place] She may need bun/cr for contrast   Thanks

## 2020-04-05 NOTE — Telephone Encounter (Signed)
I have forwarded this to our referral team/ Brooke/Ashtyn.  Jerene Pitch is covering for [employer] while she is off this week.

## 2020-04-05 NOTE — Telephone Encounter (Signed)
I have not heard back from Dr Denman George- but think it would be wise to image abd/pelvis with a CT scan given one sided leg swelling (granted insurance will cover it)  Want to make sure nothing it blocking lymphatic drainage from above given her h/o cancer  Would she be agreeable to that?   If so [place] or [place]? Thanks

## 2020-04-05 NOTE — Telephone Encounter (Signed)
Pt notified of Dr. Marliss Coots comments. Pt agrees with CT order. Please put order in pt would like to have it done in [place], I advise pt our [facility] will call to schedule appt

## 2020-04-06 ENCOUNTER — Telehealth: Payer: Self-pay

## 2020-04-06 DIAGNOSIS — I10 Essential (primary) hypertension: Secondary | ICD-10-CM

## 2020-04-06 NOTE — Addendum Note (Signed)
Addended by: Loura Pardon A on: 04/06/2020 12:57 PM   Modules accepted: Orders

## 2020-04-06 NOTE — Telephone Encounter (Signed)
done

## 2020-04-06 NOTE — Telephone Encounter (Signed)
Patient has CT scan scheduled for 04/14/20. She will need labs completed prior to this. Dr. Glori Bickers, would you mind placing lab orders for this?  I went ahead and scheduled lab appt for 10/4//21

## 2020-04-08 NOTE — Telephone Encounter (Signed)
Dr. Glori Bickers, patient will need labs prior. Lab appt sch.

## 2020-04-10 NOTE — Addendum Note (Signed)
Addended by: Loura Pardon A on: 04/10/2020 05:28 PM   Modules accepted: Orders

## 2020-04-12 ENCOUNTER — Other Ambulatory Visit: Payer: Self-pay

## 2020-04-12 ENCOUNTER — Other Ambulatory Visit (INDEPENDENT_AMBULATORY_CARE_PROVIDER_SITE_OTHER): Payer: PPO

## 2020-04-12 DIAGNOSIS — R600 Localized edema: Secondary | ICD-10-CM | POA: Diagnosis not present

## 2020-04-12 DIAGNOSIS — I10 Essential (primary) hypertension: Secondary | ICD-10-CM

## 2020-04-12 LAB — BASIC METABOLIC PANEL
BUN: 10 mg/dL (ref 6–23)
CO2: 29 mEq/L (ref 19–32)
Calcium: 9.5 mg/dL (ref 8.4–10.5)
Chloride: 104 mEq/L (ref 96–112)
Creatinine, Ser: 0.62 mg/dL (ref 0.40–1.20)
GFR: 88.12 mL/min (ref >60.00)
Glucose, Bld: 87 mg/dL (ref 70–99)
Potassium: 4 mEq/L (ref 3.5–5.1)
Sodium: 140 mEq/L (ref 135–145)

## 2020-04-14 ENCOUNTER — Other Ambulatory Visit: Payer: Self-pay

## 2020-04-14 ENCOUNTER — Ambulatory Visit (INDEPENDENT_AMBULATORY_CARE_PROVIDER_SITE_OTHER)
Admission: RE | Admit: 2020-04-14 | Discharge: 2020-04-14 | Disposition: A | Discharge status: Home / Self Care | Payer: PPO | Source: Ambulatory Visit | Attending: Family Medicine | Admitting: Family Medicine

## 2020-04-14 DIAGNOSIS — Z8542 Personal history of malignant neoplasm of other parts of uterus: Secondary | ICD-10-CM

## 2020-04-14 DIAGNOSIS — R600 Localized edema: Secondary | ICD-10-CM | POA: Diagnosis not present

## 2020-04-14 DIAGNOSIS — N3289 Other specified disorders of bladder: Secondary | ICD-10-CM | POA: Diagnosis not present

## 2020-04-14 DIAGNOSIS — N8110 Cystocele, unspecified: Secondary | ICD-10-CM | POA: Diagnosis not present

## 2020-04-14 DIAGNOSIS — M79604 Pain in right leg: Secondary | ICD-10-CM | POA: Diagnosis not present

## 2020-04-14 MED ORDER — IOHEXOL 300 MG/ML  SOLN
100.0000 mL | Freq: Once | INTRAMUSCULAR | Status: AC | PRN
  Administered 2020-04-14: 100 mL via INTRAVENOUS

## 2020-04-18 DIAGNOSIS — M79661 Pain in right lower leg: Secondary | ICD-10-CM | POA: Diagnosis not present

## 2020-04-20 ENCOUNTER — Telehealth: Payer: Self-pay | Admitting: Family Medicine

## 2020-04-20 DIAGNOSIS — R600 Localized edema: Secondary | ICD-10-CM

## 2020-04-20 NOTE — Telephone Encounter (Signed)
Patient called in wanting to discuss a vascular referral. Stated she spoke with husband and they are okay to continue with having referral placed. Please advise.

## 2020-04-23 ENCOUNTER — Ambulatory Visit: Payer: PPO | Attending: Internal Medicine

## 2020-04-23 DIAGNOSIS — Z23 Encounter for immunization: Secondary | ICD-10-CM

## 2020-04-23 NOTE — Progress Notes (Signed)
   Covid-19 Vaccination Clinic  Name:  Veronica Greene    MRN: [id number] DOB: [birth year]  04/23/2020  [name] was observed post Covid-19 immunization for 15 minutes without incident. She was provided with Vaccine Information Sheet and instruction to access the V-Safe system.   [name] was instructed to call 911 with any severe reactions post vaccine: Marland Kitchen Difficulty breathing  . Swelling of face and throat  . A fast heartbeat  . A bad rash all over body  . Dizziness and weakness

## 2020-04-25 NOTE — Telephone Encounter (Signed)
Referral sent 

## 2020-04-26 DIAGNOSIS — L570 Actinic keratosis: Secondary | ICD-10-CM | POA: Diagnosis not present

## 2020-04-26 DIAGNOSIS — I8310 Varicose veins of unspecified lower extremity with inflammation: Secondary | ICD-10-CM | POA: Diagnosis not present

## 2020-05-04 ENCOUNTER — Other Ambulatory Visit: Payer: Self-pay

## 2020-05-04 ENCOUNTER — Telehealth: Payer: Self-pay | Admitting: Physician Assistant

## 2020-05-04 ENCOUNTER — Ambulatory Visit (HOSPITAL_COMMUNITY): Payer: PPO | Attending: Cardiovascular Disease

## 2020-05-04 ENCOUNTER — Ambulatory Visit: Payer: PPO | Admitting: Physician Assistant

## 2020-05-04 ENCOUNTER — Encounter: Payer: Self-pay | Admitting: Physician Assistant

## 2020-05-04 VITALS — BP 144/60 | HR 67 | Ht 65.0 in | Wt 187.2 lb

## 2020-05-04 DIAGNOSIS — Z23 Encounter for immunization: Secondary | ICD-10-CM | POA: Diagnosis not present

## 2020-05-04 DIAGNOSIS — Z952 Presence of prosthetic heart valve: Secondary | ICD-10-CM | POA: Diagnosis not present

## 2020-05-04 DIAGNOSIS — M1990 Unspecified osteoarthritis, unspecified site: Secondary | ICD-10-CM

## 2020-05-04 DIAGNOSIS — I10 Essential (primary) hypertension: Secondary | ICD-10-CM

## 2020-05-04 DIAGNOSIS — R609 Edema, unspecified: Secondary | ICD-10-CM | POA: Diagnosis not present

## 2020-05-04 DIAGNOSIS — I350 Nonrheumatic aortic (valve) stenosis: Secondary | ICD-10-CM

## 2020-05-04 LAB — ECHOCARDIOGRAM COMPLETE
AR max vel: 1.56 cm2
AV Area VTI: 1.47 cm2
AV Area mean vel: 1.3 cm2
AV Mean grad: 14 mmHg
AV Peak grad: 23.4 mmHg
Ao pk vel: 2.42 m/s
Area-P 1/2: 2.39 cm2
P 1/2 time: 464 msec
S' Lateral: 2.4 cm

## 2020-05-04 MED ORDER — AMLODIPINE BESYLATE 5 MG PO TABS: 5.0000 mg | ORAL_TABLET | Freq: Every day | ORAL | 3 refills | Status: DC

## 2020-05-04 MED ORDER — FUROSEMIDE 20 MG PO TABS: ORAL_TABLET | ORAL | 11 refills | Status: DC

## 2020-05-04 NOTE — Patient Instructions (Signed)
Medication Instructions:  1.) lasix - change to one tablet THREE TIMES PER WEEK AS NEEDED FOR SWELLING  *If you need a refill on your cardiac medications before your next appointment, please call your pharmacy*   Lab Work: none If you have labs (blood work) drawn today and your tests are completely normal, you will receive your results only by: Marland Kitchen MyChart Message (if you have MyChart) OR . A paper copy in the mail If you have any lab test that is abnormal or we need to change your treatment, we will call you to review the results.   Testing/Procedures: none   Follow-Up: As planned with your cardiologist.  Other Instructions

## 2020-05-04 NOTE — Telephone Encounter (Signed)
*  STAT* If patient is at the pharmacy, call can be transferred to refill team.   1. Which medications need to be refilled? (please list name of each medication and dose if known) amLODipine (NORVASC) 5 MG tablet  2. Which pharmacy/location (including street and city if local pharmacy) is medication to be sent to? [place], [place]  3. Do they need a 30 day or 90 day supply? 90 day   Patient is out of medication

## 2020-05-04 NOTE — Telephone Encounter (Signed)
Pt's medication was sent to pt's pharmacy as requested. Confirmation received.  °

## 2020-05-04 NOTE — Progress Notes (Signed)
[facility]                                       Cardiology Office Note    Date:  05/05/2020   ID:  Greene, Veronica 25-Aug-1945, MRN [id number]  PCP:  Abner Greenspan, MD  Cardiologist: Glenetta Hew, MD / Dr. Angelena Form & Dr. Cyndia Bent (TAVR)  CC: 1 year s/p TAVR  History of Present Illness:  Veronica Greene is a 74 y.o. female with a history of HTN, GERD, fibromyalgia, endometrial cancer, post polio syndrome, depression/anxiety and severe AS s/p TAVR (04/29/19) who presents to clinic for follow up.   She has a history of post polio syndrome with right leg weakness and decreased length. She has a history of aortic stenosis first diagnosed around 2013 when she was noted to have a heart murmur. Aortic stenosis progressed to severe last year. Pre TAVR cardiac catheterization 10/1/20showed no obstructive CAD.   She was evaluated by the multidisciplinary valve team and underwent successful TAVR with a 23 mm Edwards Sapien 3 Ultra THV via the TF approach on 04/28/19. Post op echo showed EF 60-65%, normally functioning TAVR with a mean gradient of 13 mm Hg and no PVL. She had an uncomplicated hospital course and was discharged on POD1. She was discharged on aspirin and plavix. 1 month echo showed EF 60%, normally functioning TAVR with a mean gradient of 74m Hg and trivial PVL.  Today she presents to clinic for follow up. Doing well. No CP or SOB. Having some worsening Le  Edema in left leg (effected by polio) but no orthopnea or PND. Has been taking lasix 2 x a week for swelling which helps but swelling hs recently gotten worse. No dizziness or syncope. No blood in stool or urine. No palpitations. Has had several mechanical falls recently. Hit her face pretty badly recently with extensive ecchymosis.    Past Medical History:  Diagnosis Date  . Anxiety and depression 08/26/2007   Qualifier: Diagnosis of  By: TGlori BickersMD, MCarmell Austria  .  Arthritis    knees  . Constipation, slow transit 11/03/2010  . Degenerative disk disease 08/31/2011  . Depression with anxiety   . DERMATITIS, SEBORRHEIC 08/25/2010   Qualifier: Diagnosis of  By: TGlori BickersMD, MCarmell Austria  . Endometrial cancer ([facility]    02/ 2019  Diagnosed with D&C/hysteroscopy with polypectomy  . Essential hypertension 08/29/2017  . Fibromyalgia    tx mobic  . Full dentures   . GERD 08/26/2007   Qualifier: Diagnosis of  By: TGlori BickersMD, MCarmell Austria  . Gout   . Hearing loss    wears bilateral hearing aids  . History of colon polyps   . History of nonmelanoma skin cancer    right lower leg  . History of poliomyelitis 08/26/2007   Qualifier: History of  By: TGlori BickersMD, MCarmell Austria  . Hyperlipidemia, mild 09/02/2012  . Hypertension   . Hypokalemia 01/02/2011  . IBS (irritable bowel syndrome)    with constipation  . Obesity 08/26/2007   Qualifier: Diagnosis of  By: TGlori BickersMD, MCarmell Austria  . OSTEOARTHRITIS, HANDS, BILATERAL 08/26/2007   Qualifier: Diagnosis of  By: TGlori BickersMD, MCarmell Austria  . Osteopenia   . OVERACTIVE BLADDER 08/26/2007   Qualifier: Diagnosis of  By: TGlori BickersMD, MCarmell Austria  .  Personal history of colonic polyps 11/03/2010   05/2018 13 polyps mostly adenomas, ssp's - recall 1 year 2020 Gatha Mayer, MD, [facility]   . Poor balance 11/25/2018   With falls  Has rise in R shoe/post polio  Weak legs as well    . S/P TAVR (transcatheter aortic valve replacement)    s/p TAVR with a 72m Edwards S3U via the TF approach by Dr. BCyndia Bentand Dr. MAngelena Form . Severe calcific aortic stenosis 08/27/2017    -> Became severe as of October 2020-referred for TAVR on 04/28/2019  . URINARY INCONTINENCE 08/26/2007   Qualifier: Diagnosis of  By: TGlori BickersMD, MCarmell Austria  . Wears hearing aid in both ears     Past Surgical History:  Procedure Laterality Date  . ANKLE SURGERY Right 1956  . CARPAL TUNNEL RELEASE Bilateral 1986, 1993  . CATARACT EXTRACTION Right 11/16/2019  . COLONOSCOPY N/A 06/24/2015    Procedure: COLONOSCOPY;  Surgeon: CGatha Mayer MD;  Location: [facility];  Service: Endoscopy;  Laterality: N/A;  . DILATATION & CURETTAGE/HYSTEROSCOPY WITH MYOSURE N/A 08/09/2017   Procedure: DILATATION & CURETTAGE/HYSTEROSCOPY WITH MYOSURE;  Surgeon: CChristophe Louis MD;  [facility];  Service: Gynecology;  Laterality: N/A;  Polypectomy  . DILATION AND CURETTAGE OF UTERUS     PMB  . HAND SURGERY Bilateral 1999   Thumbs   . KNEE SURGERY Bilateral 1998   x2  . LYMPH NODE BIOPSY N/A 10/08/2017   Procedure: LYMPH NODE BIOPSY;  Surgeon: REveritt Amber MD;  Location: [facility];  Service: Gynecology;  Laterality: N/A;  . MULTIPLE TOOTH EXTRACTIONS    . NASAL SINUS SURGERY  1976  . polio surgery.     right foot - right leg 1.5" shorter than left leg - some wekness  . RIGHT/LEFT HEART CATH AND CORONARY ANGIOGRAPHY N/A 04/09/2019   Procedure: RIGHT/LEFT HEART CATH AND CORONARY ANGIOGRAPHY;  Surgeon: HLeonie Man MD;  [facility];  Service: Cardiovascular;; Severe AS, mean gradient 50 mmHg P-P 55 mmHg (estimated AVA 0.76 cm).  Mildly elevated pulmonary pressures with elevated LVEDP and PCWP.  Angiographically normal coronary arteries, but tortuous.  . ROBOTIC ASSISTED TOTAL HYSTERECTOMY WITH BILATERAL SALPINGO OOPHERECTOMY Bilateral 10/08/2017   Procedure: XI ROBOTIC ASSISTED TOTAL HYSTERECTOMY WITH BILATERAL SALPINGO OOPHORECTOMY;  Surgeon: REveritt Amber MD;  Location: [facility];  Service: Gynecology;  Laterality: Bilateral;  . TEE WITHOUT CARDIOVERSION N/A 04/28/2019   Procedure: TRANSESOPHAGEAL ECHOCARDIOGRAM (TEE);  Surgeon: MBurnell Blanks MD;  [facility];  Service: Open Heart Surgery;  Laterality: N/A;  . THUMB ARTHROSCOPY  1999  . TONSILLECTOMY  [id number]  74years old  . TRANSCATHETER AORTIC VALVE REPLACEMENT, TRANSFEMORAL N/A 04/28/2019   Procedure: TRANSCATHETER AORTIC VALVE REPLACEMENT, TRANSFEMORAL;  Surgeon: MBurnell Blanks MD;  [facility]; ; 23 mm Sapien 3 Ultra THV via TF approach. (post-op mean AV gradient 13 mmHg)  . TRANSTHORACIC ECHOCARDIOGRAM  05/20/2020   (2nd post TAVR) EF 60 to 65%.  Severely increased thickness/LVH.  GRII DD with only mildly elevated left atrial pressure.  Normal RA size.  Severe MAC with only trace MR.  No MS.  TAVR valve prosthesis functioning well.  Trivial AI.  Mean gradient 15 mmHg with a peak 29.8 mmHg.  Stable.  . TRANSTHORACIC ECHOCARDIOGRAM  02/17/2019   Mod LVH. GR 2 DD. Severe AS (mean gradient = 44 mmHg with Mod AI. mild MS.  .Marland KitchenTRANSTHORACIC ECHOCARDIOGRAM  04/29/2019  a)  (post TAVR): EF 60-65%.  GRII DD.  Mod LA dilation & nl RA.  Mod MAC.  No  AI (paravalvular leak-PVL), mean AV gradient 13 mmHg.;; b) EF 60 to 65%.  Severely increased thickness/LVH.  GRII DD w/ only mildly elevated LAP.  NL RA size.  Severe MAC with only trace MR.  No MS.  TAVR valve prosthesis functioning well.  Trivial AI.  Mean gradient 15 mmHg with a peak 29.8 mmHg. -- STABLE  . TUBAL LIGATION    . UPPER GI ENDOSCOPY     normal  . WISDOM TOOTH EXTRACTION      Current Medications: Outpatient Medications Prior to Visit  Medication Sig Dispense Refill  . acetaminophen (TYLENOL) 325 MG tablet Take 650 mg by mouth every 6 (six) hours as needed (for pain.).    Marland Kitchen acetic acid-hydrocortisone (ACETASOL HC) OTIC solution Place 2 drops into both ears 2 (two) times daily as needed (itching).    . Ascorbic Acid (VITAMIN C) 1000 MG tablet Take 1,000 mg by mouth daily with breakfast.     . aspirin EC 81 MG tablet Take 81 mg by mouth daily.    . Black Pepper-Turmeric (TURMERIC COMPLEX/BLACK PEPPER PO) Take 15 mLs by mouth daily with breakfast.     . Calcium-Phosphorus-Vitamin D (CALCIUM/VITAMIN D3/ADULT GUMMY PO) Take 1 tablet by mouth 2 (two) times daily.     . cetirizine (ZYRTEC) 10 MG tablet Take 5 mg by mouth as needed for allergies.     . cholecalciferol (VITAMIN D) 1000 UNITS tablet Take 1,000 Units by mouth  daily with breakfast.     . D 1000 25 MCG (1000 UT) capsule 1 Add'l Sig oral Select Frequency    . fluticasone (FLONASE) 50 MCG/ACT nasal spray Place 2 sprays into both nostrils at bedtime.     . GUAIFENESIN 1200 PO Take 1,200 mg by mouth at bedtime.     . hydrochlorothiazide (HYDRODIURIL) 25 MG tablet Take 1 tablet (25 mg total) by mouth daily with lunch. 90 tablet 3  . lubiprostone (AMITIZA) 24 MCG capsule Take 1 capsule (24 mcg total) by mouth 2 (two) times daily. LUNCH & SUPPER 180 capsule 3  . meloxicam (MOBIC) 7.5 MG tablet TAKE 1 TABLET BY MOUTH AT BEDTIME. 90 tablet 3  . metoprolol succinate (TOPROL-XL) 25 MG 24 hr tablet TAKE 1 TABLET BY MOUTH DAILY. 90 tablet 3  . Multiple Vitamin (MULTIVITAMIN WITH MINERALS) TABS tablet Take 1 tablet by mouth daily.     Marland Kitchen NEXIUM 40 MG capsule 1 Add'l Sig oral Select Frequency    . Polyethyl Glycol-Propyl Glycol (SYSTANE) 0.4-0.3 % SOLN Place 1-2 drops into both eyes 2 (two) times daily.     . potassium chloride (KLOR-CON) 10 MEQ tablet Take 2 tablets (20 mEq total) by mouth 2 (two) times daily. 360 tablet 3  . Simethicone (PHAZYME PO) Take 1 tablet by mouth at bedtime as needed (for gas).     . sodium chloride (AYR) 0.65 % nasal spray Place 1 spray into the nose daily.     Marland Kitchen venlafaxine XR (EFFEXOR XR) 75 MG 24 hr capsule Take 1 capsule (75 mg total) by mouth daily after breakfast. 90 capsule 3  . amLODipine (NORVASC) 5 MG tablet Take 1 tablet (5 mg total) by mouth daily. 90 tablet 3  . furosemide (LASIX) 20 MG tablet May take 20 mg as needed up to twice a week  Do not take HCTZ that day 30 tablet 11  Facility-Administered Medications Prior to Visit  Medication Dose Route Frequency Provider Last Rate Last Admin  . 0.9 %  sodium chloride infusion  500 mL Intravenous Once Gatha Mayer, MD         Allergies:   Clarithromycin, Penicillins, and Codeine   Social History   Socioeconomic History  . Marital status: Married    Spouse name: Not on  file  . Number of children: 2  . Years of education: Not on file  . Highest education level: High school graduate  Occupational History  . 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 Smoking status: Never Smoker  . Smokeless tobacco: Never Used  Vaping Use  . Vaping Use: Never used  Substance and Sexual Activity  . Alcohol use: No    Alcohol/week: 0.0 standard drinks  . Drug use: No  . Sexual activity: Yes    Birth control/protection: Post-menopausal  Other Topics Concern  . Not on file  Social History Narrative   She is relatively recently remarried.  She has 2  children and 3 grandchildren.     She is currently retired.  But enjoys cleaning house and doing chores.  She likes to do yard work.  Does not routinely exercise.   Social Determinants of Health   Financial Resource Strain: Low Risk   . Difficulty of Paying Living Expenses: Not hard at all  Food Insecurity: No Food Insecurity  . Worried About Charity fundraiser in the Last Year: Never true  . Ran Out of Food in the Last Year: Never true  Transportation Needs: No Transportation Needs  . Lack of Transportation (Medical): No  . Lack of Transportation (Non-Medical): No  Physical Activity: Inactive  . Days of Exercise per Week: 0 days  . Minutes of Exercise per Session: 0 min  Stress: No Stress Concern Present  . Feeling of Stress : Not at all  Social Connections:   . Frequency of Communication with Friends and Family: Not on file  . Frequency of Social Gatherings with Friends and Family: Not on file  . Attends Religious Services: Not on file  . Active Member of Clubs or Organizations: Not on file  . Attends Archivist Meetings: Not on file  . Marital Status: Not on file     Family History:  The patient's family history includes Alzheimer's disease in her mother; Cancer in her paternal uncle; Colon cancer (age of onset: 63) in her father and  paternal aunt; Heart attack in her sister; Kidney disease in her mother and sister; Stroke (age of onset: 56) in her father.     ROS:   Please see the history of present illness.    ROS All other systems reviewed and are negative.   PHYSICAL EXAM:   VS:  BP (!) 144/60   Pulse 67   Ht _0  (1.651 m)   Wt 187 lb 3.2 oz (84.9 kg)   SpO2 94%   BMI 31.15 kg/m    GEN: Well nourished, well developed, in no acute distress HEENT: normal Neck: no JVD or masses Cardiac: RRR; 2/6 soft flow murmur. No rubs, or gallops. 1+ right LE edema (smaller limb) Respiratory:  clear to auscultation bilaterally, normal work of breathing GI: soft, nontender, nondistended, + BS MS: no deformity or atrophy Skin: warm and dry, no rash.  Neuro:  Alert and Oriented x 3, Strength and sensation are intact  Psych: euthymic mood, full affect   Wt Readings from Last 3 Encounters:  05/04/20 187 lb 3.2 oz (84.9 kg)  03/30/20 187 lb 3 oz (84.9 kg)  03/22/20 185 lb 7 oz (84.1 kg)      Studies/Labs Reviewed:   EKG:  EKG is not ordered today.   Recent Labs: 11/20/2019: ALT 15; Hemoglobin 12.7; Platelets 185.0; TSH 1.98 04/12/2020: BUN 10; Creatinine, Ser 0.62; Potassium 4.0; Sodium 140   Lipid Panel    Component Value Date/Time   CHOL 178 11/20/2019 0858   TRIG 107.0 11/20/2019 0858   HDL 46.00 11/20/2019 0858   CHOLHDL 4 11/20/2019 0858   VLDL 21.4 11/20/2019 0858   LDLCALC 111 (H) 11/20/2019 0858    Additional studies/ records that were reviewed today include:   TAVR OPERATIVE NOTE   Date of Procedure:04/28/2019  Preoperative Diagnosis:Severe Aortic Stenosis   Postoperative Diagnosis:Same   Procedure:   Transcatheter Aortic Valve Replacement - PercutaneousLeftTransfemoral Approach Edwards Sapien 3 Ultra THV (size 74m, model # 9L4387844 serial # [id number]  Co-Surgeons:Bryan KAlveria Apley MD and CLauree Chandler MD   Anesthesiologist:S. TGifford Shave MD  Echocardiographer:P. NJohnsie Cancel MD  Pre-operative Echo Findings: ? Severe aortic stenosis ? Normalleft ventricular systolic function  Post-operative Echo Findings: ? Noparavalvular leak ? Normalleft ventricular systolic function  _____________    Echo 04/29/19: IMPRESSIONS  1. Left ventricular ejection fraction, by visual estimation, is 60 to 65%. The left ventricle has normal function. There is no left ventricular hypertrophy.  2. Left ventricular diastolic Doppler parameters are consistent with pseudonormalization pattern of LV diastolic filling.  3. Global right ventricle has normal systolic function.The right ventricular size is normal. No increase in right ventricular wall thickness.  4. Left atrial size was moderately dilated.  5. Right atrial size was normal.  6. Moderate calcification of the mitral valve leaflet(s).  7. Moderate mitral annular calcification.  8. Moderate thickening of the mitral valve leaflet(s).  9. The mitral valve is degenerative. Mild mitral valve regurgitation. Mild mitral stenosis. 10. The tricuspid valve is normal in structure. Tricuspid valve regurgitation is trivial. 11. S/P TAVR 04/28/19. Valve is well seated without apparent regurgitation. 12. Aortic valve mean gradient measures 13.0 mmHg. 13. Aortic valve regurgitation was not visualized by color flow Doppler. 14. The pulmonic valve was grossly normal. Pulmonic valve regurgitation is not visualized by color flow Doppler. 15. Normal pulmonary artery systolic pressure. 16. The inferior vena cava is dilated in size with >50% respiratory variability, suggesting right atrial pressure of 8 mmHg.  _____________  Echo 05/21/19 IMPRESSIONS  1. Left ventricular ejection fraction, by visual estimation, is 60 to 65%. The left ventricle has normal function. Left ventricular septal wall thickness was severely  increased. Severely increased left ventricular posterior wall thickness.  2. Left ventricular diastolic parameters are consistent with Grade II diastolic dysfunction (pseudonormalization).  3. Elevated left ventricular end-diastolic pressure.  4. Global right ventricle has normal systolic function.The right ventricular size is normal. No increase in right ventricular wall thickness.  5. Left atrial size was mildly dilated.  6. Right atrial size was normal.  7. The mitral valve is degenerative. Severe mitral annular calcification of the posterior MV annulus.Trace mitral valve regurgitation. No evidence of mitral stenosis.  8. The tricuspid valve is normal in structure. Tricuspid valve regurgitation is mild.  9. TAVR valve is present in the aortic position. There is trivial periprosthetic AI. Aortic valve mean gradient measures 15.0 mmHg. Aortic valve peak gradient measures 29.8 mmHg. Aortic valve area, by  VTI measures 1.59 cm. 10. The pulmonic valve was normal in structure. Pulmonic valve regurgitation is not visualized. 11. Normal pulmonary artery systolic pressure. 12. The inferior vena cava is normal in size with greater than 50% respiratory variability, suggesting right atrial pressure of 3 mmHg.  _________________  Echo 05/04/20 IMPRESSIONS  1. Left ventricular ejection fraction, by estimation, is 60 to 65%. The  left ventricle has normal function. The left ventricle has no regional  wall motion abnormalities. Left ventricular diastolic parameters are  indeterminate.  2. Right ventricular systolic function is normal. The right ventricular  size is normal. There is normal pulmonary artery systolic pressure.  3. Left atrial size was moderately dilated.  4. The mitral valve is degenerative. Mild mitral valve regurgitation. No  evidence of mitral stenosis. Moderate mitral annular calcification.  5. Post TAVR 04/29/19 with 23 mm Sapien 3 valve stable mean gradient 14  mmHg no PVL DVI  0.47 AVA 1.5 cm2. The aortic valve has been  repaired/replaced. Aortic valve regurgitation is not visualized. No aortic  stenosis is present. Procedure Date: October  2020.  6. The inferior vena cava is normal in size with greater than 50%  respiratory variability, suggesting right atrial pressure of 3 mmHg.   ASSESSMENT & PLAN:   Severe AS s/p TAVR: echo today shows EF 60%, normally functioning TAVR with a mean gradient of 14 mm hg and no PVL. She has NYHA class II but is mostly limited by mobility issues. SBE prophylaxis discussed; she has full dentures and does not go to the dentist. She will continue on aspirin 12m daily alone.   HTN: Bp borderline today. No changes made except I increased lasix to 3 times a week due to right LE edema. Continue other medications.   RLE edema: she has had worsening LE edema in leg effected by polio. Will change PRN lasix from 2x a week to 3 x a week (she does not take HCTZ when she takes lasix). Recent BMET showed normal renal function and K 4.0  Medication Adjustments/Labs and Tests Ordered: Current medicines are reviewed at length with the patient today.  Concerns regarding medicines are outlined above.  Medication changes, Labs and Tests ordered today are listed in the Patient Instructions below. Patient Instructions  Medication Instructions:  1.) lasix - change to one tablet THREE TIMES PER WEEK AS NEEDED FOR SWELLING  *If you need a refill on your cardiac medications before your next appointment, please call your pharmacy*   Lab Work: none If you have labs (blood work) drawn today and your tests are completely normal, you will receive your results only by: .Marland KitchenMyChart Message (if you have MyChart) OR . A paper copy in the mail If you have any lab test that is abnormal or we need to change your treatment, we will call you to review the results.   Testing/Procedures: none   Follow-Up: As planned with your cardiologist.  Other  Instructions      Signed, KAngelena Form PA-C  05/05/2020 10:52 AM    [id number] [place]  [id number]: [phone] Fax: [phone]

## 2020-05-05 NOTE — Addendum Note (Signed)
Addended by: Angelena Form R on: 05/05/2020 11:15 AM   Modules accepted: Level of Service

## 2020-06-09 ENCOUNTER — Ambulatory Visit (INDEPENDENT_AMBULATORY_CARE_PROVIDER_SITE_OTHER): Payer: PPO

## 2020-06-09 ENCOUNTER — Other Ambulatory Visit: Payer: Self-pay

## 2020-06-09 ENCOUNTER — Encounter: Payer: Self-pay | Admitting: Podiatry

## 2020-06-09 ENCOUNTER — Ambulatory Visit: Payer: PPO | Admitting: Podiatry

## 2020-06-09 DIAGNOSIS — Q820 Hereditary lymphedema: Secondary | ICD-10-CM | POA: Diagnosis not present

## 2020-06-09 DIAGNOSIS — M778 Other enthesopathies, not elsewhere classified: Secondary | ICD-10-CM | POA: Diagnosis not present

## 2020-06-09 DIAGNOSIS — M7752 Other enthesopathy of left foot: Secondary | ICD-10-CM

## 2020-06-09 DIAGNOSIS — M779 Enthesopathy, unspecified: Secondary | ICD-10-CM

## 2020-06-09 DIAGNOSIS — M7751 Other enthesopathy of right foot: Secondary | ICD-10-CM | POA: Diagnosis not present

## 2020-06-09 MED ORDER — TRIAMCINOLONE ACETONIDE 10 MG/ML IJ SUSP
10.0000 mg | Freq: Once | INTRAMUSCULAR | Status: AC
  Administered 2020-06-09: 10 mg

## 2020-06-09 NOTE — Progress Notes (Signed)
Subjective:   Patient ID: Veronica Greene, female   DOB: 74 y.o.   MRN: [id number]   HPI Patient presents stating she started develop pain on top of her left foot again that she is not sure about the fit for her orthotics she does have polio right leg and severe structural deformity   ROS      Objective:  Physical Exam  Neurovascular status with patient found to have severe forefoot and midfoot structural deformity right with chronic edema in the right lower leg and ankle with inflammation pain of the dorsal left foot and the extensor complex      Assessment:  Probability for extensor tendinitis left and lymphedema right with arthritis add severe structural malalignment secondary to polio with severely shortened leg     Plan:  H&P reviewed both conditions and discussed lymphedema discussing types of physician she could see and structural deformity and she will work with Liliane Channel to try to get the orthotics to fit better.  Sterile prep left injected the tendon complex 3 mg Kenalog 5 mg Xylocaine advised on anti-inflammatories and topical medicine  X-rays today indicated severe structural deformity right but no indication of stress fracture with left showing inflammation and arthritis in the midtarsal joint with mild changes since last visit x-ray wise

## 2020-06-23 ENCOUNTER — Other Ambulatory Visit: Payer: Self-pay

## 2020-06-23 ENCOUNTER — Ambulatory Visit
Admission: RE | Admit: 2020-06-23 | Discharge: 2020-06-23 | Disposition: A | Discharge status: Home / Self Care | Payer: PPO | Source: Ambulatory Visit | Attending: Radiation Oncology | Admitting: Radiation Oncology

## 2020-06-23 ENCOUNTER — Encounter: Payer: Self-pay | Admitting: Radiation Oncology

## 2020-06-23 DIAGNOSIS — C541 Malignant neoplasm of endometrium: Secondary | ICD-10-CM

## 2020-06-23 DIAGNOSIS — R102 Pelvic and perineal pain: Secondary | ICD-10-CM | POA: Diagnosis not present

## 2020-06-23 DIAGNOSIS — Z791 Long term (current) use of non-steroidal anti-inflammatories (NSAID): Secondary | ICD-10-CM | POA: Diagnosis not present

## 2020-06-23 DIAGNOSIS — Z7982 Long term (current) use of aspirin: Secondary | ICD-10-CM | POA: Insufficient documentation

## 2020-06-23 DIAGNOSIS — Z8542 Personal history of malignant neoplasm of other parts of uterus: Secondary | ICD-10-CM | POA: Insufficient documentation

## 2020-06-23 DIAGNOSIS — Z86010 Personal history of colonic polyps: Secondary | ICD-10-CM | POA: Insufficient documentation

## 2020-06-23 DIAGNOSIS — Z79899 Other long term (current) drug therapy: Secondary | ICD-10-CM | POA: Insufficient documentation

## 2020-06-23 DIAGNOSIS — Z08 Encounter for follow-up examination after completed treatment for malignant neoplasm: Secondary | ICD-10-CM | POA: Diagnosis not present

## 2020-06-23 DIAGNOSIS — Z923 Personal history of irradiation: Secondary | ICD-10-CM | POA: Diagnosis not present

## 2020-06-23 LAB — URINALYSIS, COMPLETE (UACMP) WITH MICROSCOPIC
Bacteria, UA: NONE SEEN
Bilirubin Urine: NEGATIVE
Glucose, UA: NEGATIVE mg/dL
Hgb urine dipstick: NEGATIVE
Ketones, ur: NEGATIVE mg/dL
Leukocytes,Ua: NEGATIVE
Nitrite: NEGATIVE
Protein, ur: NEGATIVE mg/dL
Specific Gravity, Urine: 1.008 (ref 1.005–1.030)
pH: 7 (ref 5.0–8.0)

## 2020-06-23 NOTE — Progress Notes (Signed)
Patient here for a f/u visit with Dr. Sondra Come. She reports pelvic pain intermittently for 4  Days and feels she may have a UTI. Patient denies pain, bleeding or other sx.  BP (!) 159/53 (BP Location: Left Arm, Patient Position: Sitting)   Pulse (!) 58   Temp (!) 96.9 F (36.1 C) (Temporal)   Resp 18   Ht 5\' 5"  (1.651 m)   Wt 187 lb 6 oz (85 kg)   SpO2 100%   BMI 31.18 kg/m   Wt Readings from Last 3 Encounters:  06/23/20 187 lb 6 oz (85 kg)  05/04/20 187 lb 3.2 oz (84.9 kg)  03/30/20 187 lb 3 oz (84.9 kg)

## 2020-06-23 NOTE — Progress Notes (Addendum)
Radiation Oncology         [phone] ________________________________  Name: Veronica Greene MRN: [id number]  Date: 06/23/2020  DOB: Dec 27, 1945  Follow-Up Visit Note  CC: Tower, [name], MD  Tower, [name], MD    ICD-10-CM   1. Endometrial cancer ([place])  C54.1 Urinalysis, Complete w Microscopic    Urine culture    Diagnosis: Stage IB Grade 1 Endometrial Cancer  Interval Since Last Radiation: Two years and six months  Radiation treatment dates:  Brachytherapy: 11/20/2017, 11/27/2017, 12/05/2017, 12/09/2017, 12/11/2017  Site/dose:  Vaginal cuff / 6 Gy in 5 fractions for a total dose of 30 Gy  Narrative: The patient returns today for routine follow-up. Since her last visit, she underwent an endoscopy on 11/10/2019 that showed tubular adenomas of the transverse and sigmoid colon polyps but no high-grade dysplasia.  She last followed up with Dr. Denman George on 12/16/2019, during which time there were no symptoms concerning for recurrence.   CT scan of abdomen/pelvis on 04/14/2020 showed moderate cystocele without findings for recurrent tumor, adenopathy, or metastatic disease.  CT scan was performed  For Right leg swelling.  Patient will be seeing vascular for further evaluation of her right leg issues  On review of systems, the patient reports no pelvic pain. She denies vaginal bleeding or abdominal blade bloating.                        ALLERGIES:  is allergic to clarithromycin, penicillins, and codeine.  Meds: Current Outpatient Medications  Medication Sig Dispense Refill  . acetaminophen (TYLENOL) 325 MG tablet Take 650 mg by mouth every 6 (six) hours as needed (for pain.).    Marland Kitchen acetic acid-hydrocortisone (ACETASOL HC) OTIC solution Place 2 drops into both ears 2 (two) times daily as needed (itching).    Marland Kitchen amLODipine (NORVASC) 5 MG tablet Take 1 tablet (5 mg total) by mouth daily. 90 tablet 3  . Ascorbic Acid (VITAMIN C) 1000 MG tablet Take 1,000 mg by mouth daily with breakfast.      . aspirin EC 81 MG tablet Take 81 mg by mouth daily.    . Black Pepper-Turmeric (TURMERIC COMPLEX/BLACK PEPPER PO) Take 15 mLs by mouth daily with breakfast.     . Calcium-Phosphorus-Vitamin D (CALCIUM/VITAMIN D3/ADULT GUMMY PO) Take 1 tablet by mouth 2 (two) times daily.     . cetirizine (ZYRTEC) 10 MG tablet Take 5 mg by mouth as needed for allergies.     . cholecalciferol (VITAMIN D) 1000 UNITS tablet Take 1,000 Units by mouth daily with breakfast.     . D 1000 25 MCG (1000 UT) capsule 1 Add'l Sig oral Select Frequency    . fluticasone (FLONASE) 50 MCG/ACT nasal spray Place 2 sprays into both nostrils at bedtime.     . furosemide (LASIX) 20 MG tablet May take 20 mg as needed up to three times a week  Do not take HCTZ that day 30 tablet 11  . GUAIFENESIN 1200 PO Take 1,200 mg by mouth at bedtime.     . hydrochlorothiazide (HYDRODIURIL) 25 MG tablet Take 1 tablet (25 mg total) by mouth daily with lunch. 90 tablet 3  . lubiprostone (AMITIZA) 24 MCG capsule Take 1 capsule (24 mcg total) by mouth 2 (two) times daily. LUNCH & SUPPER 180 capsule 3  . meloxicam (MOBIC) 7.5 MG tablet TAKE 1 TABLET BY MOUTH AT BEDTIME. 90 tablet 3  . metoprolol succinate (TOPROL-XL) 25 MG 24 hr tablet  TAKE 1 TABLET BY MOUTH DAILY. 90 tablet 3  . Multiple Vitamin (MULTIVITAMIN WITH MINERALS) TABS tablet Take 1 tablet by mouth daily.     Marland Kitchen NEXIUM 40 MG capsule 1 Add'l Sig oral Select Frequency    . Polyethyl Glycol-Propyl Glycol (SYSTANE) 0.4-0.3 % SOLN Place 1-2 drops into both eyes 2 (two) times daily.     . potassium chloride (KLOR-CON) 10 MEQ tablet Take 2 tablets (20 mEq total) by mouth 2 (two) times daily. 360 tablet 3  . Simethicone (PHAZYME PO) Take 1 tablet by mouth at bedtime as needed (for gas).     . sodium chloride (AYR) 0.65 % nasal spray Place 1 spray into the nose daily.     Marland Kitchen triamcinolone (KENALOG) 0.1 % Apply topically.    . venlafaxine XR (EFFEXOR XR) 75 MG 24 hr capsule Take 1 capsule (75 mg total)  by mouth daily after breakfast. 90 capsule 3   Current Facility-Administered Medications  Medication Dose Route Frequency Provider Last Rate Last Admin  . 0.9 %  sodium chloride infusion  500 mL Intravenous Once Gatha Mayer, MD        Physical Findings: The patient is in no acute distress. Patient is alert and oriented.  height is 5\' 5"  (1.651 m) and weight is 187 lb 6 oz (85 kg). Her temporal temperature is 96.9 F (36.1 C) (abnormal). Her blood pressure is 159/53 (abnormal) and her pulse is 58 (abnormal). Her respiration is 18 and oxygen saturation is 100%.   Lungs are clear to auscultation bilaterally.  Heart has regular rhythm and rate. No palpable cervical, supraclavicular, or axillary adenopathy. Abdomen soft, non-tender, normal bowel sounds.  On pelvic examination the external genitalia were unremarkable. A speculum exam was performed. There are no mucosal lesions noted in the vaginal vault. On bimanual  examination there were no pelvic masses appreciated. Vaginal cuff intact.  Question of cystocele on exam.  Lab Findings: Lab Results  Component Value Date   WBC 4.4 11/20/2019   HGB 12.7 11/20/2019   HCT 36.3 11/20/2019   MCV 89.7 11/20/2019   PLT 185.0 11/20/2019    Radiographic Findings: DG Foot 2 Views Left  Result Date: 06/09/2020 Please see detailed radiograph report in office note.  DG Foot 2 Views Right  Result Date: 06/09/2020 Please see detailed radiograph report in office note.   Impression:  Stage IB Grade 1 Endometrial Cancer.  No evidence of recurrence on clinical exam today.  Plan:  The patient will follow up with Dr. Denman George in six months and with radiation oncology in one year.   Total time spent in this encounter was 25 minutes which included reviewing the patient's most recent follow-up with Dr. Denman George, endoscopy, pathology report, CT scan of abdomen and pelvis, physical examination, and documentation. ____________________________________  Blair Promise, PhD, MD  This document serves as a record of services personally performed by Gery Pray, MD. It was created on his behalf by Clerance Lav, a trained medical scribe. The creation of this record is based on the scribe's personal observations and the provider's statements to them. This document has been checked and approved by the attending provider.

## 2020-06-24 LAB — URINE CULTURE: Culture: NO GROWTH

## 2020-06-27 ENCOUNTER — Ambulatory Visit
Admission: RE | Admit: 2020-06-27 | Discharge: 2020-06-27 | Disposition: A | Discharge status: Home / Self Care | Payer: PPO | Source: Ambulatory Visit | Attending: Family Medicine | Admitting: Family Medicine

## 2020-06-27 ENCOUNTER — Other Ambulatory Visit: Payer: Self-pay

## 2020-06-27 ENCOUNTER — Telehealth: Payer: Self-pay

## 2020-06-27 DIAGNOSIS — Z1231 Encounter for screening mammogram for malignant neoplasm of breast: Secondary | ICD-10-CM

## 2020-06-27 NOTE — Telephone Encounter (Signed)
Patient called for the results of her UA from 06/23/2020. Patient advised the results were normal. Patient states she has not had the pelvic discomfort since she stopped drinking Sprite Zero last week.

## 2020-07-07 ENCOUNTER — Telehealth: Payer: Self-pay | Admitting: Family Medicine

## 2020-07-07 NOTE — Telephone Encounter (Signed)
Pt called in she saw Dr. Milinda Antis for a fall she had at home and she had a f/u and she needs Dr. Milinda Antis to fill out the B04 paper.

## 2020-07-07 NOTE — Telephone Encounter (Signed)
Called pt and advise he we don't keep any forms in the office and whatever company is telling her she needs [place] to fill out this form should have the form to provide to the pt. Pt said it's aflac and she will call them back to see what we need to do on our end.   FYI to PCP

## 2020-07-11 NOTE — Telephone Encounter (Signed)
Patient called stating that she has talked with a lady at her insurance company and was advised that they do not have a form to be completed. Patient stated that the  lady told her that her doctor needs to write a letter stating that she saw the patient for a follow-up visit on 03/30/20 for a recheck. Patient stated that the recheck was from a fall that she had on 03/23/20.  Patient requested that the letter be mailed to her [address] address.

## 2020-07-12 NOTE — Telephone Encounter (Signed)
Letter printed and in IN box 

## 2020-07-12 NOTE — Telephone Encounter (Signed)
Letter mailed

## 2020-07-21 ENCOUNTER — Other Ambulatory Visit: Payer: Self-pay | Admitting: *Deleted

## 2020-07-21 DIAGNOSIS — R600 Localized edema: Secondary | ICD-10-CM

## 2020-07-28 ENCOUNTER — Telehealth: Payer: Self-pay

## 2020-07-28 DIAGNOSIS — L570 Actinic keratosis: Secondary | ICD-10-CM | POA: Diagnosis not present

## 2020-07-28 DIAGNOSIS — I8311 Varicose veins of right lower extremity with inflammation: Secondary | ICD-10-CM | POA: Diagnosis not present

## 2020-07-28 DIAGNOSIS — L578 Other skin changes due to chronic exposure to nonionizing radiation: Secondary | ICD-10-CM | POA: Diagnosis not present

## 2020-07-28 NOTE — Telephone Encounter (Signed)
Pt called to say her insurance is not covering Amitiza. Asked if Dr tower would change it to something else. I suggested she contact her insurance company and ask if they have a medication they cover better than the Amitiza and let us know so we can pass that on to Dr Glori Bickers. She will call us back.

## 2020-08-03 ENCOUNTER — Other Ambulatory Visit: Payer: Self-pay

## 2020-08-03 ENCOUNTER — Ambulatory Visit (HOSPITAL_COMMUNITY)
Admission: RE | Admit: 2020-08-03 | Discharge: 2020-08-03 | Disposition: A | Discharge status: Home / Self Care | Payer: PPO | Source: Ambulatory Visit | Attending: Vascular Surgery | Admitting: Vascular Surgery

## 2020-08-03 ENCOUNTER — Encounter: Payer: Self-pay | Admitting: Vascular Surgery

## 2020-08-03 ENCOUNTER — Ambulatory Visit: Payer: PPO | Admitting: Vascular Surgery

## 2020-08-03 VITALS — BP 154/79 | HR 72 | Temp 98.2°F | Resp 18 | Ht 65.0 in | Wt 187.8 lb

## 2020-08-03 DIAGNOSIS — R600 Localized edema: Secondary | ICD-10-CM | POA: Insufficient documentation

## 2020-08-03 DIAGNOSIS — M7989 Other specified soft tissue disorders: Secondary | ICD-10-CM

## 2020-08-03 NOTE — Progress Notes (Signed)
Referring Physician: Dr. Glori Bickers  Patient name: Veronica Greene MRN: [id number] DOB: 08/21/1945 Sex: female  REASON FOR CONSULT: Right leg edema  HPI: Veronica Greene is a 75 y.o. female, with a history of chronic right leg shortening and muscle atrophy secondary to polio many years ago.  Over the last 6 to 8 months she has had increased edema in her right lower extremity of unknown etiology.  She has no history of DVT.  She does have a family history of DVT in her mother but this was a postoperative DVT.  She has no real history of varicose veins.  She has been wearing some light compression on the right lower extremity which she states helps some.  Swelling can be worse after a long trip when she goes camping.  She had a CT scan of the abdomen and pelvis in October of this past year which showed no evidence of recurrent cancer as she has known prior skin cancers on the right leg and prior endometrial cancer requiring hysterectomy.  Other medical problems include degenerative arthritis, hypertension, previous TAVR aortic valve repair.  Overall she has been stable.  Past Medical History:  Diagnosis Date  . Anxiety and depression 08/26/2007   Qualifier: Diagnosis of  By: Glori Bickers MD, Carmell Austria   . Arthritis    knees  . Constipation, slow transit 11/03/2010  . Degenerative disk disease 08/31/2011  . Depression with anxiety   . DERMATITIS, SEBORRHEIC 08/25/2010   Qualifier: Diagnosis of  By: Glori Bickers MD, Carmell Austria   . Endometrial cancer [facility])    02/ 2019  Diagnosed with D&C/hysteroscopy with polypectomy  . Essential hypertension 08/29/2017  . Fibromyalgia    tx mobic  . Full dentures   . GERD 08/26/2007   Qualifier: Diagnosis of  By: Glori Bickers MD, Carmell Austria   . Gout   . Hearing loss    wears bilateral hearing aids  . History of colon polyps   . History of nonmelanoma skin cancer    right lower leg  . History of poliomyelitis 08/26/2007   Qualifier: History of  By: Glori Bickers MD, Carmell Austria   .  Hyperlipidemia, mild 09/02/2012  . Hypertension   . Hypokalemia 01/02/2011  . IBS (irritable bowel syndrome)    with constipation  . Obesity 08/26/2007   Qualifier: Diagnosis of  By: Glori Bickers MD, Carmell Austria   . OSTEOARTHRITIS, HANDS, BILATERAL 08/26/2007   Qualifier: Diagnosis of  By: Glori Bickers MD, Carmell Austria   . Osteopenia   . OVERACTIVE BLADDER 08/26/2007   Qualifier: Diagnosis of  By: Glori Bickers MD, Carmell Austria   . Personal history of colonic polyps 11/03/2010   05/2018 13 polyps mostly adenomas, ssp's - recall 1 year 2020 Gatha Mayer, MD, [facility]   . Poor balance 11/25/2018   With falls  Has rise in R shoe/post polio  Weak legs as well    . S/P TAVR (transcatheter aortic valve replacement)    s/p TAVR with a 39m Edwards S3U via the TF approach by Dr. BCyndia Bentand Dr. MAngelena Form . Severe calcific aortic stenosis 08/27/2017    -> Became severe as of October 2020-referred for TAVR on 04/28/2019  . URINARY INCONTINENCE 08/26/2007   Qualifier: Diagnosis of  By: TGlori BickersMD, MCarmell Austria  . Wears hearing aid in both ears    Past Surgical History:  Procedure Laterality Date  . ANKLE SURGERY Right 1956  . CARPAL TUNNEL RELEASE Bilateral 1986, 1993  . CATARACT EXTRACTION  Right 11/16/2019  . COLONOSCOPY N/A 06/24/2015   Procedure: COLONOSCOPY;  Surgeon: Gatha Mayer, MD;  Location: [facility];  Service: Endoscopy;  Laterality: N/A;  . DILATATION & CURETTAGE/HYSTEROSCOPY WITH MYOSURE N/A 08/09/2017   Procedure: DILATATION & CURETTAGE/HYSTEROSCOPY WITH MYOSURE;  Surgeon: Christophe Louis, MD;  Location: [place] [facility];  Service: Gynecology;  Laterality: N/A;  Polypectomy  . DILATION AND CURETTAGE OF UTERUS     PMB  . HAND SURGERY Bilateral 1999   Thumbs   . KNEE SURGERY Bilateral 1998   x2  . LYMPH NODE BIOPSY N/A 10/08/2017   Procedure: LYMPH NODE BIOPSY;  Surgeon: [name], MD;  Location: [facility];  Service: Gynecology;  Laterality: N/A;  . MULTIPLE TOOTH EXTRACTIONS    . NASAL SINUS SURGERY  1976  . polio surgery.      right foot - right leg 1.5" shorter than left leg - some wekness  . RIGHT/LEFT HEART CATH AND CORONARY ANGIOGRAPHY N/A 04/09/2019   Procedure: RIGHT/LEFT HEART CATH AND CORONARY ANGIOGRAPHY;  Surgeon: Leonie Man, MD;  Location: [place] [facility];  Service: Cardiovascular;; Severe AS, mean gradient 50 mmHg P-P 55 mmHg (estimated AVA 0.76 cm).  Mildly elevated pulmonary pressures with elevated LVEDP and PCWP.  Angiographically normal coronary arteries, but tortuous.  . ROBOTIC ASSISTED TOTAL HYSTERECTOMY WITH BILATERAL SALPINGO OOPHERECTOMY Bilateral 10/08/2017   Procedure: XI ROBOTIC ASSISTED TOTAL HYSTERECTOMY WITH BILATERAL SALPINGO OOPHORECTOMY;  Surgeon: [name], MD;  Location: [facility];  Service: Gynecology;  Laterality: Bilateral;  . TEE WITHOUT CARDIOVERSION N/A 04/28/2019   Procedure: TRANSESOPHAGEAL ECHOCARDIOGRAM (TEE);  Surgeon: Burnell Blanks, MD;  Location: [place] [facility];  Service: Open Heart Surgery;  Laterality: N/A;  . THUMB ARTHROSCOPY  1999  . TONSILLECTOMY  385   75 years old  . TRANSCATHETER AORTIC VALVE REPLACEMENT, TRANSFEMORAL N/A 04/28/2019   Procedure: TRANSCATHETER AORTIC VALVE REPLACEMENT, TRANSFEMORAL;  Surgeon: Burnell Blanks, MD;  Location: [place] [facility]; ; 23 mm Sapien 3 Ultra THV via TF approach. (post-op mean AV gradient 13 mmHg)  . TRANSTHORACIC ECHOCARDIOGRAM  05/20/2020   (2nd post TAVR) EF 60 to 65%.  Severely increased thickness/LVH.  GRII DD with only mildly elevated left atrial pressure.  Normal RA size.  Severe MAC with only trace MR.  No MS.  TAVR valve prosthesis functioning well.  Trivial AI.  Mean gradient 15 mmHg with a peak 29.8 mmHg.  Stable.  . TRANSTHORACIC ECHOCARDIOGRAM  02/17/2019   Mod LVH. GR 2 DD. Severe AS (mean gradient = 44 mmHg with Mod AI. mild MS.  Marland Kitchen TRANSTHORACIC ECHOCARDIOGRAM  04/29/2019   a)  (post TAVR): EF 60-65%.  GRII DD.  Mod LA dilation & nl RA.  Mod MAC.  No  AI (paravalvular leak-PVL), mean AV  gradient 13 mmHg.;; b) EF 60 to 65%.  Severely increased thickness/LVH.  GRII DD w/ only mildly elevated LAP.  NL RA size.  Severe MAC with only trace MR.  No MS.  TAVR valve prosthesis functioning well.  Trivial AI.  Mean gradient 15 mmHg with a peak 29.8 mmHg. -- STABLE  . TUBAL LIGATION    . UPPER GI ENDOSCOPY     normal  . WISDOM TOOTH EXTRACTION      Family History  Problem Relation Age of Onset  . Kidney disease Mother   . Alzheimer's disease Mother   . Stroke Father 9  . Colon cancer Father 5       possibly colon cancer, possibly  just polyps  . Kidney disease Sister   . Heart attack Sister   . Colon cancer Paternal Aunt 28  . Cancer Paternal Uncle        unk type  . Esophageal cancer Neg Hx   . Stomach cancer Neg Hx   . Rectal cancer Neg Hx     SOCIAL HISTORY: Social History   Socioeconomic History  . Marital status: Married    Spouse name: Not on file  . Number of children: 2  . Years of education: Not on file  . Highest education level: High school graduate  Occupational History  . 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 Smoking status: Never Smoker  . Smokeless tobacco: Never Used  Vaping Use  . Vaping Use: Never used  Substance and Sexual Activity  . Alcohol use: No    Alcohol/week: 0.0 standard drinks  . Drug use: No  . Sexual activity: Yes    Birth control/protection: Post-menopausal  Other Topics Concern  . Not on file  Social History Narrative   She is relatively recently remarried.  She has 2  children and 3 grandchildren.     She is currently retired.  But enjoys cleaning house and doing chores.  She likes to do yard work.  Does not routinely exercise.   Social Determinants of Health   Financial Resource Strain: Low Risk   . Difficulty of Paying Living Expenses: Not hard at all  Food Insecurity: No Food Insecurity  . Worried About Charity fundraiser in the Last Year: Never  true  . Ran Out of Food in the Last Year: Never true  Transportation Needs: No Transportation Needs  . Lack of Transportation (Medical): No  . Lack of Transportation (Non-Medical): No  Physical Activity: Inactive  . Days of Exercise per Week: 0 days  . Minutes of Exercise per Session: 0 min  Stress: No Stress Concern Present  . Feeling of Stress : Not at all  Social Connections: Not on file  Intimate Partner Violence: Not At Risk  . Fear of Current or Ex-Partner: No  . Emotionally Abused: No  . Physically Abused: No  . Sexually Abused: No    Allergies  Allergen Reactions  . Clarithromycin Swelling and Other (See Comments)    Throat Swells  . Penicillins Hives and Other (See Comments)    WELTS Has patient had a PCN reaction causing immediate rash, facial/tongue/throat swelling, SOB or lightheadedness with hypotension: No Has patient had a PCN reaction causing severe rash involving mucus membranes or skin necrosis: Unknown Has patient had a PCN reaction that required hospitalization:No Has patient had a PCN reaction occurring within the last 10 years: No If all of the above answers are "NO", then may proceed with Cephalosporin use.   . Codeine Nausea And Vomiting    Current Outpatient Medications  Medication Sig Dispense Refill  . acetaminophen (TYLENOL) 325 MG tablet Take 650 mg by mouth every 6 (six) hours as needed (for pain.).    Marland Kitchen acetic acid-hydrocortisone (VOSOL-HC) OTIC solution Place 2 drops into both ears 2 (two) times daily as needed (itching).    Marland Kitchen amLODipine (NORVASC) 5 MG tablet Take 1 tablet (5 mg total) by mouth daily. 90 tablet 3  . Ascorbic Acid (VITAMIN C) 1000 MG tablet Take 1,000 mg by mouth daily with breakfast.    . aspirin EC 81 MG tablet Take 81 mg by  mouth daily.    . Black Pepper-Turmeric (TURMERIC COMPLEX/BLACK PEPPER PO) Take 15 mLs by mouth daily with breakfast.     . Calcium-Phosphorus-Vitamin D (CALCIUM/VITAMIN D3/ADULT GUMMY PO) Take 1 tablet by  mouth 2 (two) times daily.     . cetirizine (ZYRTEC) 10 MG tablet Take 5 mg by mouth as needed for allergies.     . cholecalciferol (VITAMIN D) 1000 UNITS tablet Take 1,000 Units by mouth daily with breakfast.     . D 1000 25 MCG (1000 UT) capsule 1 Add'l Sig oral Select Frequency    . fluticasone (FLONASE) 50 MCG/ACT nasal spray Place 2 sprays into both nostrils at bedtime.     . furosemide (LASIX) 20 MG tablet May take 20 mg as needed up to three times a week  Do not take HCTZ that day 30 tablet 11  . GUAIFENESIN 1200 PO Take 1,200 mg by mouth at bedtime.     . hydrochlorothiazide (HYDRODIURIL) 25 MG tablet Take 1 tablet (25 mg total) by mouth daily with lunch. (Patient taking differently: Take 25 mg by mouth as directed. Takes on days when not taking lasix.) 90 tablet 3  . lubiprostone (AMITIZA) 24 MCG capsule Take 1 capsule (24 mcg total) by mouth 2 (two) times daily. LUNCH & SUPPER 180 capsule 3  . meloxicam (MOBIC) 7.5 MG tablet TAKE 1 TABLET BY MOUTH AT BEDTIME. 90 tablet 3  . metoprolol succinate (TOPROL-XL) 25 MG 24 hr tablet TAKE 1 TABLET BY MOUTH DAILY. 90 tablet 3  . Multiple Vitamin (MULTIVITAMIN WITH MINERALS) TABS tablet Take 1 tablet by mouth daily.     Marland Kitchen NEXIUM 40 MG capsule 1 Add'l Sig oral Select Frequency    . Polyethyl Glycol-Propyl Glycol 0.4-0.3 % SOLN Place 1-2 drops into both eyes 2 (two) times daily.     . potassium chloride (KLOR-CON) 10 MEQ tablet Take 2 tablets (20 mEq total) by mouth 2 (two) times daily. 360 tablet 3  . Simethicone (PHAZYME PO) Take 1 tablet by mouth at bedtime as needed (for gas).     . sodium chloride (OCEAN) 0.65 % nasal spray Place 1 spray into the nose daily.    Marland Kitchen triamcinolone (KENALOG) 0.1 % Apply topically.    . venlafaxine XR (EFFEXOR XR) 75 MG 24 hr capsule Take 1 capsule (75 mg total) by mouth daily after breakfast. 90 capsule 3   Current Facility-Administered Medications  Medication Dose Route Frequency Provider Last Rate Last Admin  .  0.9 %  sodium chloride infusion  500 mL Intravenous Once Gatha Mayer, MD        ROS:   General:  No weight loss, Fever, chills  HEENT: No recent headaches, no nasal bleeding, no visual changes, no sore throat  Neurologic: No dizziness, blackouts, seizures. No recent symptoms of stroke or mini- stroke. No recent episodes of slurred speech, or temporary blindness.  Cardiac: No recent episodes of chest pain/pressure, no shortness of breath at rest.  No shortness of breath with exertion.  Denies history of atrial fibrillation or irregular heartbeat  Vascular: No history of rest pain in feet.  No history of claudication.  No history of non-healing ulcer, No history of DVT   Pulmonary: No home oxygen, no productive cough, no hemoptysis,  No asthma or wheezing  Musculoskeletal:  _0  Arthritis, _1  Low back pain,  _2  Joint pain  Hematologic:No history of hypercoagulable state.  No history of easy bleeding.  No history of anemia  Gastrointestinal: No hematochezia  or melena,  No gastroesophageal reflux, no trouble swallowing  Urinary: _0  chronic Kidney disease, _1  on HD - _2  MWF or _3  TTHS, _4  Burning with urination, _5  Frequent urination, _6  Difficulty urinating;   Skin: No rashes  Psychological: No history of anxiety,  No history of depression   Physical Examination  Vitals:   08/03/20 1456  BP: (!) 154/79  Pulse: 72  Resp: 18  Temp: 98.2 F (36.8 C)  TempSrc: Temporal  SpO2: 96%  Weight: 187 lb 12.8 oz (85.2 kg)  Height: _7  (1.651 m)    Body mass index is 31.25 kg/m.  General:  Alert and oriented, no acute distress HEENT: Normal Neck: No JVD Cardiac: Regular Rate and Rhythm Skin: No rash Extremity Pulses:  2+ dorsalis pedis, posterior tibial pulses bilaterally Musculoskeletal: Right leg limb shortening muscle atrophy right calf no significant edema in the right or left extremity today. Neurologic: Upper and lower extremity motor 5/5 and  symmetric  DATA:  Patient had a venous duplex exam of her right leg today.  This showed mild reflux in the right common femoral vein but no superficial venous reflux.  Greater saphenous vein diameter was less than 4 mm over most of its course.  ASSESSMENT: Chronic right leg swelling of unknown etiology.  Lymphatic obstruction was ruled out by CT scan in October 2021.  She had no significant reflux in the superficial or deep system on our exam today.  She has no evidence of DVT.  She may have an element of venous hypertension.  I do not believe she has a significant vascular etiology for her leg swelling.   PLAN: Patient was reassured today that she is not at risk of limb loss but that her leg swelling will most likely be chronic in nature.  Best option at this point would be to wear compression stocking in the right lower extremity.  She was given a brochure from elastic therapy to acquire some new stockings in the near future.  She will follow up with [place] on an as-needed basis.   Ruta Hinds, MD [facility] Office: [phone]

## 2020-08-18 DIAGNOSIS — H938X3 Other specified disorders of ear, bilateral: Secondary | ICD-10-CM | POA: Diagnosis not present

## 2020-08-18 DIAGNOSIS — H9313 Tinnitus, bilateral: Secondary | ICD-10-CM | POA: Insufficient documentation

## 2020-08-18 DIAGNOSIS — H9319 Tinnitus, unspecified ear: Secondary | ICD-10-CM | POA: Insufficient documentation

## 2020-08-18 DIAGNOSIS — Z974 Presence of external hearing-aid: Secondary | ICD-10-CM | POA: Diagnosis not present

## 2020-08-18 DIAGNOSIS — H6123 Impacted cerumen, bilateral: Secondary | ICD-10-CM | POA: Diagnosis not present

## 2020-08-18 DIAGNOSIS — L299 Pruritus, unspecified: Secondary | ICD-10-CM | POA: Insufficient documentation

## 2020-08-18 DIAGNOSIS — H903 Sensorineural hearing loss, bilateral: Secondary | ICD-10-CM | POA: Diagnosis not present

## 2020-10-06 IMAGING — CT CT ABDOMEN AND PELVIS WITH CONTRAST
2 of 5 series · 16 of 46 positions shown, 18 images · IV contrast (OMNIPAQUE)
Comparison: None.

CLINICAL DATA: Right lower quadrant pain. Endometrial carcinoma.
Status post hysterectomy and radiation therapy.

EXAM:
CT ABDOMEN AND PELVIS WITH CONTRAST
TECHNIQUE: Multidetector CT imaging of the abdomen and pelvis was performed
using the standard protocol following bolus administration of
intravenous contrast.
CONTRAST:  100mL OMNIPAQUE IOHEXOL 300 MG/ML  SOLN

[Series 2: axial st · axial · 0.80mm/px · z∈[+1062,+1422]mm · 13 of 84 slices shown, 15 images]
[im 6/84  soft-tissue]
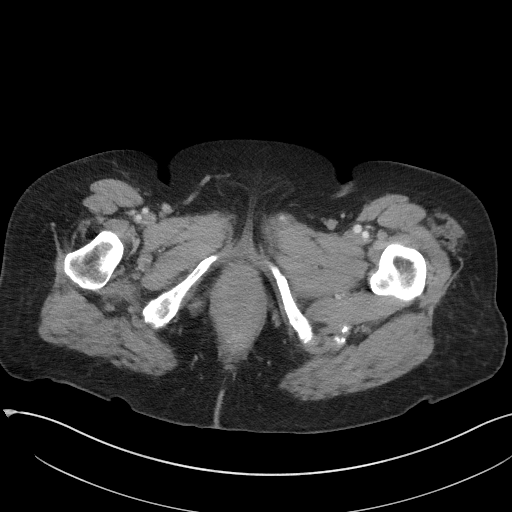
[im 6/84  bone]
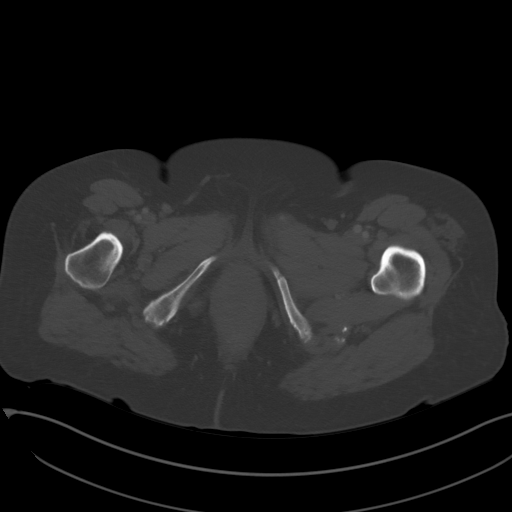
[im 12/84  soft-tissue]
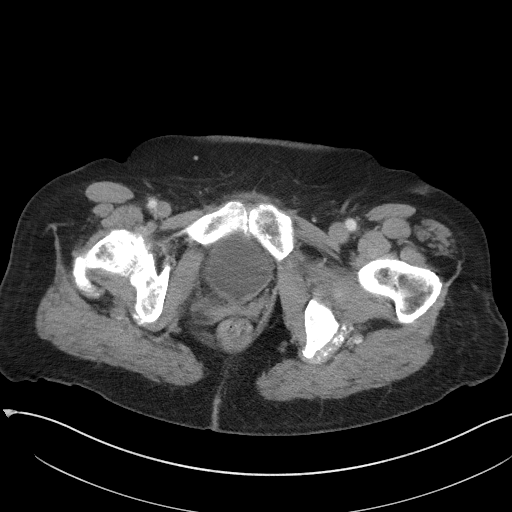
[im 18/84  soft-tissue]
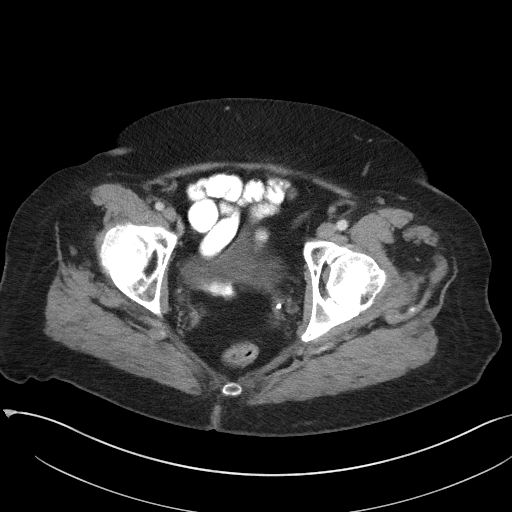
[im 24/84  soft-tissue]
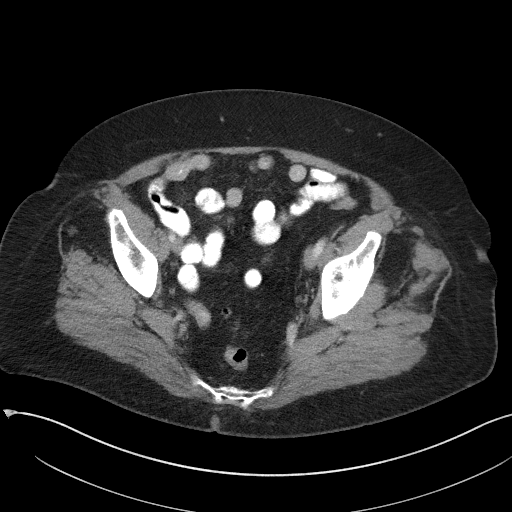
[im 30/84  soft-tissue]
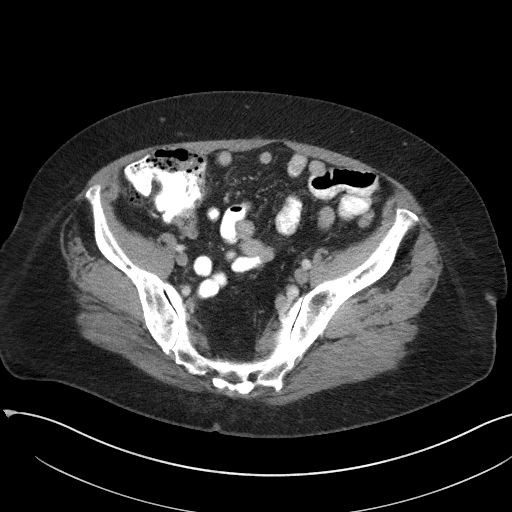
[im 36/84  soft-tissue]
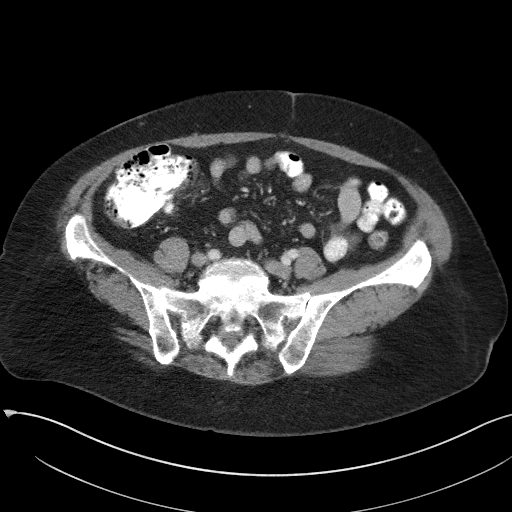
[im 42/84  soft-tissue]
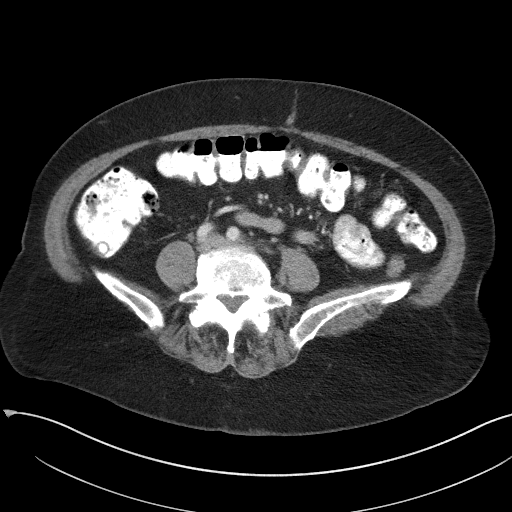
[im 48/84  soft-tissue]
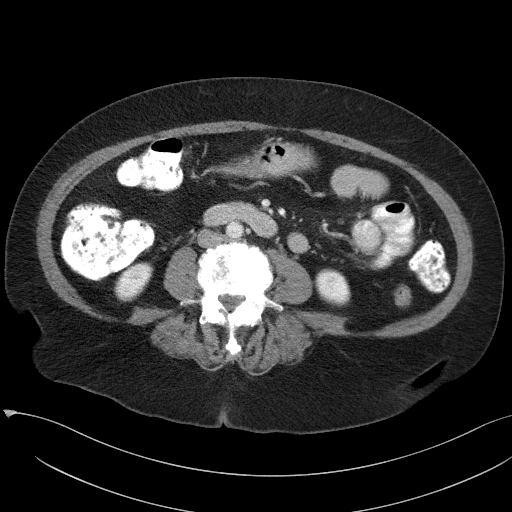
[im 54/84  soft-tissue]
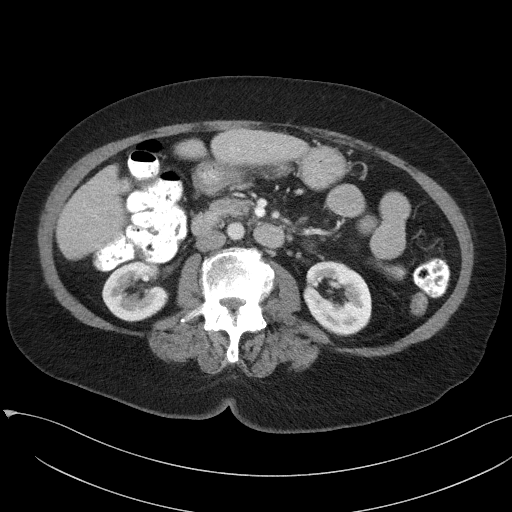
[im 54/84  bone]
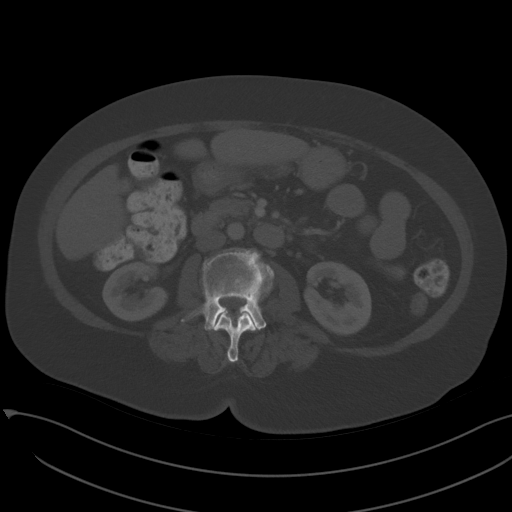
[im 60/84  soft-tissue]
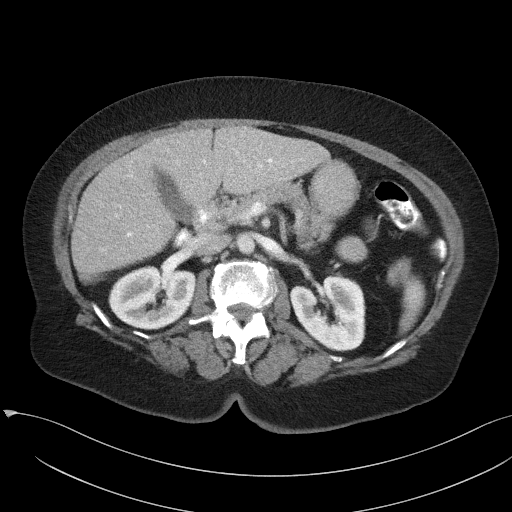
[im 66/84  soft-tissue]
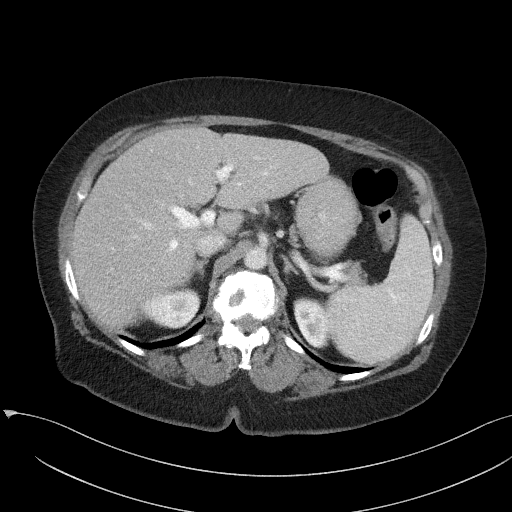
[im 72/84  soft-tissue]
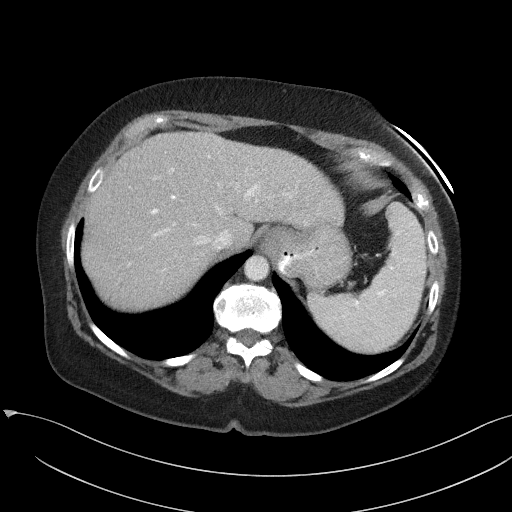
[im 78/84  soft-tissue]
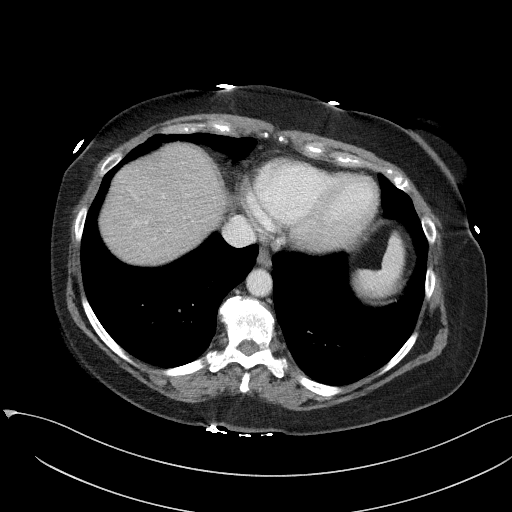

[Series 4: coronal st · coronal · 0.82mm/px · 3 of 80 slices shown]
[im 27/80  soft-tissue]
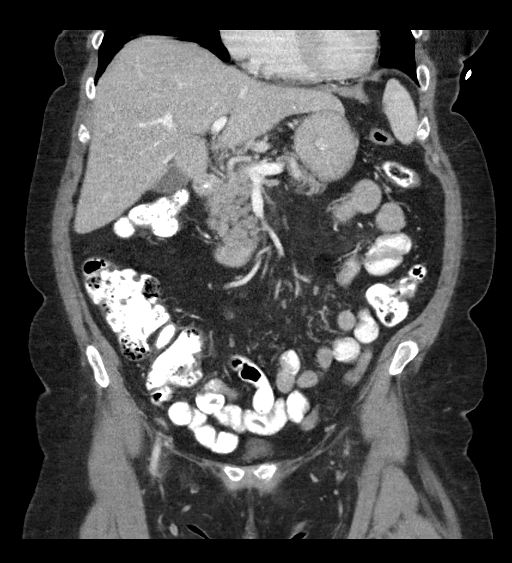
[im 36/80  soft-tissue]
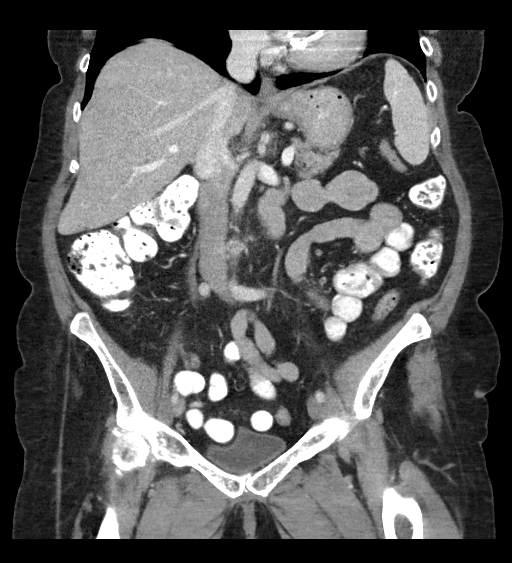
[im 44/80  soft-tissue]
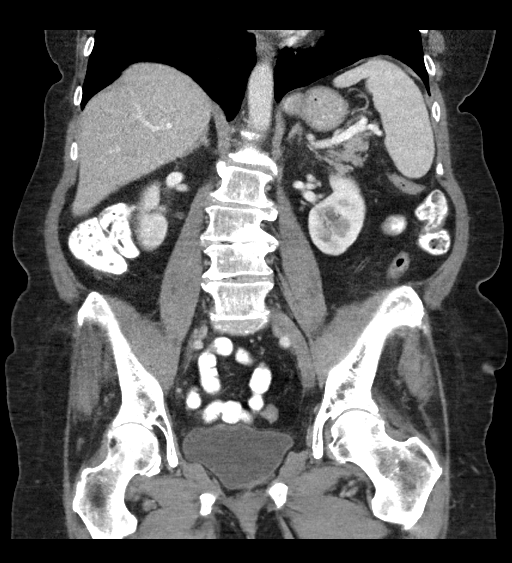

[16 of 46 positions shown; findings below may reference images not displayed]

FINDINGS: Lower Chest: No acute findings.

Hepatobiliary: No hepatic masses identified. Gallbladder is
unremarkable. No evidence of biliary ductal dilatation.

Pancreas:  No mass or inflammatory changes.

Spleen: Within normal limits in size and appearance.

Adrenals/Urinary Tract: No masses identified. No evidence of
hydronephrosis. Unremarkable unopacified urinary bladder.

Stomach/Bowel: No evidence of obstruction, inflammatory process or
abnormal fluid collections. Although the appendix is not directly
visualized, no inflammatory process seen in region of the cecum or
elsewhere.

Vascular/Lymphatic: No pathologically enlarged lymph nodes. No
abdominal aortic aneurysm.

Reproductive: Prior hysterectomy noted. Adnexal regions are
unremarkable in appearance.

Other:  None.

Musculoskeletal:  No suspicious bone lesions identified.
IMPRESSION: Negative. No evidence of recurrent or metastatic carcinoma, or other
acute findings.

## 2020-10-17 ENCOUNTER — Encounter: Payer: Self-pay | Admitting: Cardiology

## 2020-10-17 ENCOUNTER — Other Ambulatory Visit: Payer: Self-pay

## 2020-10-17 ENCOUNTER — Ambulatory Visit: Payer: PPO | Admitting: Cardiology

## 2020-10-17 VITALS — BP 138/54 | HR 68 | Ht 65.0 in | Wt 187.0 lb

## 2020-10-17 DIAGNOSIS — I10 Essential (primary) hypertension: Secondary | ICD-10-CM

## 2020-10-17 DIAGNOSIS — Z952 Presence of prosthetic heart valve: Secondary | ICD-10-CM

## 2020-10-17 DIAGNOSIS — E782 Mixed hyperlipidemia: Secondary | ICD-10-CM

## 2020-10-17 DIAGNOSIS — R600 Localized edema: Secondary | ICD-10-CM

## 2020-10-17 DIAGNOSIS — I350 Nonrheumatic aortic (valve) stenosis: Secondary | ICD-10-CM | POA: Diagnosis not present

## 2020-10-17 DIAGNOSIS — E785 Hyperlipidemia, unspecified: Secondary | ICD-10-CM

## 2020-10-17 NOTE — Patient Instructions (Addendum)
Medication Instructions:  No changes  *If you need a refill on your cardiac medications before your next appointment, please call your pharmacy*   Lab Work: None ordered  If you have labs (blood work) drawn today and your tests are completely normal, you will receive your results only by: Marland Kitchen MyChart Message (if you have MyChart) OR . A paper copy in the mail If you have any lab test that is abnormal or we need to change your treatment, we will call you to review the results.   Testing/Procedures: None ordered   Follow-Up: At [facility], you and your health needs are our priority.  As part of our continuing mission to provide you with exceptional heart care, we have created designated Provider Care Teams.  These Care Teams include your primary Cardiologist (physician) and Advanced Practice Providers (APPs -  Physician Assistants and Nurse Practitioners) who all work together to provide you with the care you need, when you need it.  We recommend signing up for the patient portal called "MyChart".  Sign up information is provided on this After Visit Summary.  MyChart is used to connect with patients for Virtual Visits (Telemedicine).  Patients are able to view lab/test results, encounter notes, upcoming appointments, etc.  Non-urgent messages can be sent to your provider as well.   To learn more about what you can do with MyChart, go to [email or web address].    Your next appointment:   6 month(s)  The format for your next appointment:   In Person  Provider:   Glenetta Hew, MD

## 2020-10-17 NOTE — Progress Notes (Signed)
Primary Care Provider: Abner Greenspan, MD Cardiologist: Veronica Hew, MD Electrophysiologist: None  Clinic Note: Chief Complaint  Patient presents with  . Follow-up    8 months.  . Cardiac Valve Problem    18 months post TAVR.  Doing well   ===================================  ASSESSMENT/PLAN   Problem List Items Addressed This Visit    Lower extremity edema    Right leg is more swollen than left.  Continue sleeve support stockings.  No evidence of DVT. She has PRN Lasix-she can take up to 3 days a week in place of HCTZ.      Severe calcific aortic stenosis (Chronic)    1-1/2 years out from TAVR.  Doing well.  Stable echo last October.      Hyperlipidemia with target LDL less than 100 (Chronic)    Target LDL is less than 100; trying to avoid statin.  She will be due for labs to be checked again this summer.  May need to consider Nexletol or Nexlizet as an option. Continue exercise, and dietary modification. Defer to PCP.      Essential hypertension - Primary (Chronic)    Borderline pressure today, but has been pretty well controlled.  She is on HCTZ and Toprol along with amlodipine.  I will have room for titration.  For now continue current dose and monitor.  We will probably not increase amlodipine given swelling.      Relevant Orders   EKG 12-Lead (Completed)   S/P TAVR (transcatheter aortic valve replacement) (Chronic)    Doing well post TAVR.  Echo stable.  No AI with mean gradient 14 mmHg.  Likely has been released from the TAVR team. Discussed SBE prophylaxis. Would like to follow-up echocardiogram this fall.  (Can order a follow-up visit.)        ===================================  HPI:    Veronica Greene is a 75 y.o. female with a PMH notable for history of TAVR (October 2020, 23 mm Edwards SAPIEN 3 Ultra THV, transfemoral) for Severe AS who presents today for 42-monthfollow-up with me in 6 months from last visit with TAVR valve team.  She has  history of polio with chronic right leg shortening and muscle atrophy.  Veronica Barlettawas last seen on May 04, 2020 by Veronica Rota PA.  She was doing well.  No chest pain or dyspnea.  Essentially no heart failure symptoms besides some mild right leg swelling.  No PND orthopnea.  Had noted several mechanical falls recently.  With 1 fall, she actually hit her face after tripping.  Recommendation was to continue aspirin daily.  Discussed SBE prophylaxis. => Was given 3 times a week Lasix  Recent Hospitalizations: None  Seen by Veronica Greene August 03, 2020 for longstanding right leg swelling (6 to 8 months) there was some deep vein reflux noted in the common femoral vein, but not found to be significant.  Continue to wear support support stockings.  Reviewed  CV studies:    The following studies were reviewed today: (if available, images/films reviewed: From Epic Chart or Care Everywhere) . TTE 05/04/2020: EF 60 to 65%.  Normal LV function and wall motion.  Indeterminate diastolic parameters.  Normal RV size and pressure.  Moderate LA dilation.  Moderate MAC but no significant MR.  Post TAVR with 23 mm Sapien 3 valve: Mean gradient 14 mmHg.  No PVL.  Normal IVC . 08/03/2018: Right lower extremity venous Dopplers: No evidence of DVT.  Deep vein reflux  noted in right CFV.  No superficial vein reflux.   Interval History:   Veronica Greene returns here today overall doing pretty well Dr. Quay Greene standpoint.  She still has the right leg swelling and wearing a "sleeve "she did not do well with the over the foot tighter stockings.  She is on her feet all day long doing routine activity.  No real symptoms of exertional dyspnea or chest pain.  No PND orthopnea.  No further falls after the more significant fall in September.  CV Review of Symptoms (Summary) no chest pain or dyspnea on exertion positive for - Unilateral right greater than left swelling.  See above negative for -  irregular heartbeat, loss of consciousness, orthopnea, palpitations, paroxysmal nocturnal dyspnea, rapid heart rate, shortness of breath or Lightheadedness, dizziness or near syncope, TIA/amaurosis fugax, claudication  The patient does not have symptoms concerning for COVID-19 infection (fever, chills, cough, or new shortness of breath).   REVIEWED OF SYSTEMS   Review of Systems  Constitutional: Negative for malaise/fatigue and weight loss.  HENT: Negative for congestion and nosebleeds.   Respiratory: Negative for cough and shortness of breath.   Cardiovascular: Positive for leg swelling (See above).  Gastrointestinal: Negative for blood in stool and melena.  Musculoskeletal: Negative for joint pain.  Neurological: Negative for dizziness, speech change and focal weakness.  Psychiatric/Behavioral: Negative for depression and memory loss. The patient is not nervous/anxious and does not have insomnia.    I have reviewed and (if needed) personally updated the patient's problem list, medications, allergies, past medical and surgical history, social and family history.   PAST MEDICAL HISTORY   Past Medical History:  Diagnosis Date  . Anxiety and depression 08/26/2007   Qualifier: Diagnosis of  By: Veronica Bickers MD, Carmell Austria   . Arthritis    knees  . Constipation, slow transit 11/03/2010  . Degenerative disk disease 08/31/2011  . Depression with anxiety   . DERMATITIS, SEBORRHEIC 08/25/2010   Qualifier: Diagnosis of  By: Veronica Bickers MD, Carmell Austria   . Endometrial cancer [facility])    02/ 2019  Diagnosed with D&C/hysteroscopy with polypectomy  . Essential hypertension 08/29/2017  . Fibromyalgia    tx mobic  . Full dentures   . GERD 08/26/2007   Qualifier: Diagnosis of  By: Veronica Bickers MD, Carmell Austria   . Gout   . Hearing loss    wears bilateral hearing aids  . History of colon polyps   . History of nonmelanoma skin cancer    right lower leg  . History of poliomyelitis 08/26/2007   Qualifier: History of  By:  Veronica Bickers MD, Carmell Austria   . Hyperlipidemia, mild 09/02/2012  . Hypertension   . Hypokalemia 01/02/2011  . IBS (irritable bowel syndrome)    with constipation  . Obesity 08/26/2007   Qualifier: Diagnosis of  By: Veronica Bickers MD, Carmell Austria   . OSTEOARTHRITIS, HANDS, BILATERAL 08/26/2007   Qualifier: Diagnosis of  By: Veronica Bickers MD, Carmell Austria   . Osteopenia   . OVERACTIVE BLADDER 08/26/2007   Qualifier: Diagnosis of  By: Veronica Bickers MD, Carmell Austria   . Personal history of colonic polyps 11/03/2010   05/2018 13 polyps mostly adenomas, ssp's - recall 1 year 2020 Gatha Mayer, MD, [facility]   . Poor balance 11/25/2018   With falls  Has rise in R shoe/post polio  Weak legs as well    . S/P TAVR (transcatheter aortic valve replacement)    s/p TAVR with a 74m Edwards S3U  via the TF approach by Dr. Cyndia Bent and Dr. Angelena Form  . Severe calcific aortic stenosis 08/27/2017    -> Became severe as of October 2020-referred for TAVR on 04/28/2019  . URINARY INCONTINENCE 08/26/2007   Qualifier: Diagnosis of  By: Veronica Bickers MD, Carmell Austria   . Wears hearing aid in both ears     PAST SURGICAL HISTORY   Past Surgical History:  Procedure Laterality Date  . ANKLE SURGERY Right 1956  . CARPAL TUNNEL RELEASE Bilateral 1986, 1993  . CATARACT EXTRACTION Right 11/16/2019  . COLONOSCOPY N/A 06/24/2015   Procedure: COLONOSCOPY;  Surgeon: Gatha Mayer, MD;  Location: [facility];  Service: Endoscopy;  Laterality: N/A;  . DILATATION & CURETTAGE/HYSTEROSCOPY WITH MYOSURE N/A 08/09/2017   Procedure: DILATATION & CURETTAGE/HYSTEROSCOPY WITH MYOSURE;  Surgeon: Christophe Louis, MD;  Location: [facility];  Service: Gynecology;  Laterality: N/A;  Polypectomy  . DILATION AND CURETTAGE OF UTERUS     PMB  . HAND SURGERY Bilateral 1999   Thumbs   . KNEE SURGERY Bilateral 1998   x2  . LYMPH NODE BIOPSY N/A 10/08/2017   Procedure: LYMPH NODE BIOPSY;  Surgeon: [name], MD;  Location: [facility];  Service: Gynecology;  Laterality: N/A;  . MULTIPLE TOOTH EXTRACTIONS     . NASAL SINUS SURGERY  1976  . polio surgery.     right foot - right leg 1.5" shorter than left leg - some wekness  . RIGHT/LEFT HEART CATH AND CORONARY ANGIOGRAPHY N/A 04/09/2019   Procedure: RIGHT/LEFT HEART CATH AND CORONARY ANGIOGRAPHY;  Surgeon: Leonie Man, MD;  Location: [place] [facility];  Service: Cardiovascular;; Severe AS, mean gradient 50 mmHg P-P 55 mmHg (estimated AVA 0.76 cm).  Mildly elevated pulmonary pressures with elevated LVEDP and PCWP.  Angiographically normal coronary arteries, but tortuous.  . ROBOTIC ASSISTED TOTAL HYSTERECTOMY WITH BILATERAL SALPINGO OOPHERECTOMY Bilateral 10/08/2017   Procedure: XI ROBOTIC ASSISTED TOTAL HYSTERECTOMY WITH BILATERAL SALPINGO OOPHORECTOMY;  Surgeon: [name], MD;  Location: [facility];  Service: Gynecology;  Laterality: Bilateral;  . TEE WITHOUT CARDIOVERSION N/A 04/28/2019   Procedure: TRANSESOPHAGEAL ECHOCARDIOGRAM (TEE);  Surgeon: Burnell Blanks, MD;  Location: [place] [facility];  Service: Open Heart Surgery;  Laterality: N/A;  . THUMB ARTHROSCOPY  1999  . TONSILLECTOMY  3272   75 years old  . TRANSCATHETER AORTIC VALVE REPLACEMENT, TRANSFEMORAL N/A 04/28/2019   Procedure: TRANSCATHETER AORTIC VALVE REPLACEMENT, TRANSFEMORAL;  Surgeon: Burnell Blanks, MD;  Location: [place] [facility]; ; 23 mm Sapien 3 Ultra THV via TF approach. (post-op mean AV gradient 13 mmHg)  . TRANSTHORACIC ECHOCARDIOGRAM  05/04/2020   EF 60 to 65%.  Normal LV function and wall motion.  Indeterminate diastolic parameters.  Normal RV size and pressure.  Moderate LA dilation.  Moderate MAC but no significant MR.  Post TAVR with 23 mm Sapien 3 valve: Mean gradient 14 mmHg.  No PVL.  Normal IVC  . TRANSTHORACIC ECHOCARDIOGRAM  02/17/2019   Mod LVH. GR 2 DD. Severe AS (mean gradient = 44 mmHg with Mod AI. mild MS.  Marland Kitchen TRANSTHORACIC ECHOCARDIOGRAM  04/29/2019; 05/2020   a)  (post TAVR): EF 60-65%.  GRII DD.  Mod LA dilation & nl RA.  Mod MAC.  No   AI (no-PVL), mean AV gradient 13 mmHg.;; b) (2nd post TAVR) EF 60 to 65%.  Severely increased thickness/LVH.  GRII DD with only mildly elevated left atrial pressure.  Normal RA size.  Severe MAC with only trace MR.  No MS.  TAVR valve prosthesis functioning well.  Trivial AI.  Mean gradient 15 mmHg with a peak 29.8 mmHg  . TUBAL LIGATION    . UPPER GI ENDOSCOPY     normal  . WISDOM TOOTH EXTRACTION      Immunization History  Administered Date(s) Administered  . Fluad Quad(high Dose 65+) 05/06/2019, 05/04/2020  . Influenza Whole 04/08/2008, 06/30/2009  . Influenza, High Dose Seasonal PF 05/17/2015, 05/07/2016, 05/07/2017, 04/22/2018  . Influenza-Unspecified 05/09/2014  . PFIZER(Purple Top)SARS-COV-2 Vaccination 07/30/2019, 08/20/2019, 04/23/2020  . Pneumococcal Conjugate-13 10/12/2014  . Pneumococcal Polysaccharide-23 08/25/2010  . Td 10/15/2003  . Tdap 06/18/2018  . Zoster 08/12/2008    MEDICATIONS/ALLERGIES   Current Meds  Medication Sig  . acetaminophen (TYLENOL) 325 MG tablet Take 650 mg by mouth every 6 (six) hours as needed (for pain.).  Marland Kitchen acetic acid-hydrocortisone (VOSOL-HC) OTIC solution Place 2 drops into both ears 2 (two) times daily as needed (itching).  Marland Kitchen amLODipine (NORVASC) 5 MG tablet Take 1 tablet (5 mg total) by mouth daily.  . Ascorbic Acid (VITAMIN C) 1000 MG tablet Take 1,000 mg by mouth daily with breakfast.  . aspirin EC 81 MG tablet Take 81 mg by mouth daily.  . Black Pepper-Turmeric (TURMERIC COMPLEX/BLACK PEPPER PO) Take 15 mLs by mouth daily with breakfast.   . Calcium-Phosphorus-Vitamin D (CALCIUM/VITAMIN D3/ADULT GUMMY PO) Take 1 tablet by mouth 2 (two) times daily.   . cetirizine (ZYRTEC) 10 MG tablet Take 5 mg by mouth as needed for allergies.   . cholecalciferol (VITAMIN D) 1000 UNITS tablet Take 1,000 Units by mouth daily with breakfast.   . D 1000 25 MCG (1000 UT) capsule 1 Add'l Sig oral Select Frequency  . fluticasone (FLONASE) 50 MCG/ACT nasal  spray Place 2 sprays into both nostrils at bedtime.   . furosemide (LASIX) 20 MG tablet May take 20 mg as needed up to three times a week  Do not take HCTZ that day  . GUAIFENESIN 1200 PO Take 1,200 mg by mouth at bedtime.   Marland Kitchen lubiprostone (AMITIZA) 24 MCG capsule Take 1 capsule (24 mcg total) by mouth 2 (two) times daily. LUNCH & SUPPER  . meloxicam (MOBIC) 7.5 MG tablet TAKE 1 TABLET BY MOUTH AT BEDTIME.  . metoprolol succinate (TOPROL-XL) 25 MG 24 hr tablet TAKE 1 TABLET BY MOUTH DAILY.  . Multiple Vitamin (MULTIVITAMIN WITH MINERALS) TABS tablet Take 1 tablet by mouth daily.   Marland Kitchen NEXIUM 40 MG capsule 1 Add'l Sig oral Select Frequency  . Polyethyl Glycol-Propyl Glycol 0.4-0.3 % SOLN Place 1-2 drops into both eyes 2 (two) times daily.   . Simethicone (PHAZYME PO) Take 1 tablet by mouth at bedtime as needed (for gas).   . sodium chloride (OCEAN) 0.65 % nasal spray Place 1 spray into the nose daily.  Marland Kitchen triamcinolone (KENALOG) 0.1 % Apply topically.  . venlafaxine XR (EFFEXOR XR) 75 MG 24 hr capsule Take 1 capsule (75 mg total) by mouth daily after breakfast.  . [DISCONTINUED] hydrochlorothiazide (HYDRODIURIL) 25 MG tablet Take 1 tablet (25 mg total) by mouth daily with lunch. (Patient taking differently: Take 25 mg by mouth as directed. Takes on days when not taking lasix.)  . [DISCONTINUED] potassium chloride (KLOR-CON) 10 MEQ tablet Take 2 tablets (20 mEq total) by mouth 2 (two) times daily.   Current Facility-Administered Medications for the 10/17/20 encounter (Office Visit) with Leonie Man, MD  Medication  . 0.9 %  sodium chloride infusion    Allergies  Allergen Reactions  . Clarithromycin Swelling and Other (See Comments)    Throat Swells  . Penicillins Hives and Other (See Comments)    WELTS Has patient had a PCN reaction causing immediate rash, facial/tongue/throat swelling, SOB or lightheadedness with hypotension: No Has patient had a PCN reaction causing severe rash involving  mucus membranes or skin necrosis: Unknown Has patient had a PCN reaction that required hospitalization:No Has patient had a PCN reaction occurring within the last 10 years: No If all of the above answers are "NO", then may proceed with Cephalosporin use.   . Codeine Nausea And Vomiting    SOCIAL HISTORY/FAMILY HISTORY   Reviewed in Epic:  Pertinent findings:  Social History   Tobacco Use  . Smoking status: Never Smoker  . Smokeless tobacco: Never Used  Vaping Use  . Vaping Use: Never used  Substance Use Topics  . Alcohol use: No    Alcohol/week: 0.0 standard drinks  . Drug use: No   Social History   Social History Narrative   She is relatively recently remarried.  She has 2  children and 3 grandchildren.     She is currently retired.  But enjoys cleaning house and doing chores.  She likes to do yard work.  Does not routinely exercise.    OBJCTIVE -PE, EKG, labs   Wt Readings from Last 3 Encounters:  10/17/20 187 lb (84.8 kg)  08/03/20 187 lb 12.8 oz (85.2 kg)  06/23/20 187 lb 6 oz (85 kg)    Physical Exam: BP (!) 138/54 (BP Location: Left Arm, Patient Position: Sitting, Cuff Size: Normal)   Pulse 68   Ht _0  (1.651 m)   Wt 187 lb (84.8 kg)   BMI 31.12 kg/m  Physical Exam Vitals reviewed.  Constitutional:      General: She is not in acute distress.    Appearance: Normal appearance. She is obese. She is not toxic-appearing or diaphoretic.  HENT:     Head: Normocephalic and atraumatic.  Neck:     Vascular: No carotid bruit, hepatojugular reflux or JVD.  Cardiovascular:     Rate and Rhythm: Normal rate and regular rhythm.  No extrasystoles are present.    Pulses: Normal pulses and intact distal pulses.     Heart sounds: Murmur (No diastolic murmur) heard.   Harsh early systolic murmur is present with a grade of 1/6 at the upper right sternal border radiating to the neck. No friction rub. No gallop.   Pulmonary:     Effort: Pulmonary effort is normal. No  respiratory distress.     Breath sounds: Normal breath sounds. No wheezing, rhonchi or rales.  Chest:     Chest wall: No tenderness.  Musculoskeletal:        General: Deformity (Chronic right leg atrophy with leg shortening) present.     Cervical back: Normal range of motion and neck supple.     Right lower leg: Edema (1-2+) present.     Left lower leg: No edema.  Neurological:     General: No focal deficit present.     Mental Status: She is alert and oriented to person, place, and time.  Psychiatric:        Mood and Affect: Mood normal.        Behavior: Behavior normal.        Thought Content: Thought content normal.        Judgment: Judgment normal.     Adult ECG Report  Rate: 68;  Rhythm:  normal sinus rhythm and Normal axis, intervals and durations;   Narrative Interpretation: Stable  Recent Labs: Due for labs next month Lab Results  Component Value Date   CHOL 178 11/20/2019   HDL 46.00 11/20/2019   LDLCALC 111 (H) 11/20/2019   TRIG 107.0 11/20/2019   CHOLHDL 4 11/20/2019   Lab Results  Component Value Date   CREATININE 0.62 04/12/2020   BUN 10 04/12/2020   NA 140 04/12/2020   K 4.0 04/12/2020   CL 104 04/12/2020   CO2 29 04/12/2020   CBC Latest Ref Rng & Units 11/20/2019 10/15/2019 04/29/2019  WBC 4.0 - 10.5 K/uL 4.4 5.6 6.1  Hemoglobin 12.0 - 15.0 g/dL 12.7 12.8 10.7(L)  Hematocrit 36.0 - 46.0 % 36.3 36.5 31.1(L)  Platelets 150.0 - 400.0 K/uL 185.0 197.0 169    Lab Results  Component Value Date   TSH 1.98 11/20/2019    ==================================================  COVID-19 Education: The signs and symptoms of COVID-19 were discussed with the patient and how to seek care for testing (follow up with PCP or arrange E-visit).   The importance of social distancing and COVID-19 vaccination was discussed today. The patient is practicing social distancing & Masking.   I spent a total of 29mnutes with the patient spent in direct patient consultation.   Additional time spent with chart review  / charting (studies, outside notes, etc): 153m Total Time: 35 min  Current medicines are reviewed at length with the patient today.  (+/- concerns) none  This visit occurred during the SARS-CoV-2 public health emergency.  Safety protocols were in place, including screening questions prior to the visit, additional usage of staff PPE, and extensive cleaning of exam room while observing appropriate contact time as indicated for disinfecting solutions.  Notice: This dictation was prepared with Dragon dictation along with smaller phrase technology. Any transcriptional errors that result from this process are unintentional and may not be corrected upon review.  Patient Instructions / Medication Changes & Studies & Tests Ordered   Patient Instructions  Medication Instructions:  No changes  *If you need a refill on your cardiac medications before your next appointment, please call your pharmacy*   Lab Work: None ordered  If you have labs (blood work) drawn today and your tests are completely normal, you will receive your results only by: . Marland KitchenyChart Message (if you have MyChart) OR . A paper copy in the mail If you have any lab test that is abnormal or we need to change your treatment, we will call you to review the results.   Testing/Procedures: None ordered   Follow-Up: [facility]  As part of our continuing mission to provide you with exceptional heart care, we have created designated Provider Care Teams.  These Care Teams include your primary Cardiologist (physician) and Advanced Practice Providers (APPs -  Physician Assistants and Nurse Practitioners) who all work together to provide you with the care you need, when you need it.  We recommend signing up for the patient portal called "MyChart".  Sign up information is provided on this After Visit Summary.  MyChart is used to connect with  patients for Virtual Visits (Telemedicine).  Patients are able to view lab/test results, encounter notes, upcoming appointments, etc.  Non-urgent messages can be sent to your provider as well.   To learn more about what you can do with MyChart, go to [email or web address]   Your next appointment:   6 month(s)  The format for your next appointment:   In Person  Provider:   Glenetta Hew, MD  Studies Ordered:   Orders Placed This Encounter  Procedures  . EKG 12-Lead     Veronica Greene, M.D., M.S. Interventional Cardiologist   Pager # [phone] Phone # [phone] [address]. [place], [place] [address]   Thank you for choosing Heartcare at [facility]!!

## 2020-10-25 DIAGNOSIS — D485 Neoplasm of uncertain behavior of skin: Secondary | ICD-10-CM | POA: Diagnosis not present

## 2020-10-25 DIAGNOSIS — C44722 Squamous cell carcinoma of skin of right lower limb, including hip: Secondary | ICD-10-CM | POA: Diagnosis not present

## 2020-10-25 DIAGNOSIS — D229 Melanocytic nevi, unspecified: Secondary | ICD-10-CM | POA: Diagnosis not present

## 2020-10-25 DIAGNOSIS — L814 Other melanin hyperpigmentation: Secondary | ICD-10-CM | POA: Diagnosis not present

## 2020-10-25 DIAGNOSIS — L812 Freckles: Secondary | ICD-10-CM | POA: Diagnosis not present

## 2020-10-25 DIAGNOSIS — L820 Inflamed seborrheic keratosis: Secondary | ICD-10-CM | POA: Diagnosis not present

## 2020-10-25 DIAGNOSIS — L570 Actinic keratosis: Secondary | ICD-10-CM | POA: Diagnosis not present

## 2020-10-25 DIAGNOSIS — L819 Disorder of pigmentation, unspecified: Secondary | ICD-10-CM | POA: Diagnosis not present

## 2020-10-25 DIAGNOSIS — Z85828 Personal history of other malignant neoplasm of skin: Secondary | ICD-10-CM | POA: Diagnosis not present

## 2020-10-25 DIAGNOSIS — D1801 Hemangioma of skin and subcutaneous tissue: Secondary | ICD-10-CM | POA: Diagnosis not present

## 2020-10-25 DIAGNOSIS — I8393 Asymptomatic varicose veins of bilateral lower extremities: Secondary | ICD-10-CM | POA: Diagnosis not present

## 2020-10-25 DIAGNOSIS — L905 Scar conditions and fibrosis of skin: Secondary | ICD-10-CM | POA: Diagnosis not present

## 2020-10-25 DIAGNOSIS — L821 Other seborrheic keratosis: Secondary | ICD-10-CM | POA: Diagnosis not present

## 2020-10-26 ENCOUNTER — Other Ambulatory Visit: Payer: Self-pay | Admitting: Family Medicine

## 2020-11-05 ENCOUNTER — Encounter: Payer: Self-pay | Admitting: Cardiology

## 2020-11-05 NOTE — Assessment & Plan Note (Signed)
Doing well post TAVR.  Echo stable.  No AI with mean gradient 14 mmHg.  Likely has been released from the TAVR team. Discussed SBE prophylaxis. Would like to follow-up echocardiogram this fall.  (Can order a follow-up visit.)

## 2020-11-05 NOTE — Assessment & Plan Note (Signed)
1-1/2 years out from TAVR.  Doing well.  Stable echo last October.

## 2020-11-05 NOTE — Assessment & Plan Note (Addendum)
Target LDL is less than 100; trying to avoid statin.  She will be due for labs to be checked again this summer.  May need to consider Nexletol or Nexlizet as an option. Continue exercise, and dietary modification. Defer to PCP.

## 2020-11-05 NOTE — Assessment & Plan Note (Signed)
Right leg is more swollen than left.  Continue sleeve support stockings.  No evidence of DVT. She has PRN Lasix-she can take up to 3 days a week in place of HCTZ.

## 2020-11-05 NOTE — Assessment & Plan Note (Signed)
Borderline pressure today, but has been pretty well controlled.  She is on HCTZ and Toprol along with amlodipine.  I will have room for titration.  For now continue current dose and monitor.  We will probably not increase amlodipine given swelling.

## 2020-11-24 ENCOUNTER — Ambulatory Visit: Payer: PPO

## 2020-11-24 ENCOUNTER — Telehealth: Payer: Self-pay | Admitting: Family Medicine

## 2020-11-24 DIAGNOSIS — I10 Essential (primary) hypertension: Secondary | ICD-10-CM

## 2020-11-24 DIAGNOSIS — E785 Hyperlipidemia, unspecified: Secondary | ICD-10-CM

## 2020-11-24 NOTE — Telephone Encounter (Signed)
-----   Message from Cloyd Stagers, RT sent at 11/08/2020  1:52 PM EDT ----- Regarding: Lab Orders for Friday 5.20.2022 Please place lab orders for Friday 5.20.2022, office visit for physical on Tuesday 5.24.2022 Thank you, Dyke Maes RT(R)

## 2020-11-25 ENCOUNTER — Other Ambulatory Visit (INDEPENDENT_AMBULATORY_CARE_PROVIDER_SITE_OTHER): Payer: PPO

## 2020-11-25 ENCOUNTER — Other Ambulatory Visit: Payer: Self-pay

## 2020-11-25 DIAGNOSIS — E785 Hyperlipidemia, unspecified: Secondary | ICD-10-CM

## 2020-11-25 DIAGNOSIS — I10 Essential (primary) hypertension: Secondary | ICD-10-CM | POA: Diagnosis not present

## 2020-11-25 LAB — CBC WITH DIFFERENTIAL/PLATELET
Basophils Absolute: 0 10*3/uL (ref 0.0–0.1)
Basophils Relative: 0.4 % (ref 0.0–3.0)
Eosinophils Absolute: 0.2 10*3/uL (ref 0.0–0.7)
Eosinophils Relative: 3.4 % (ref 0.0–5.0)
HCT: 34.5 % — ABNORMAL LOW (ref 36.0–46.0)
Hemoglobin: 12.1 g/dL (ref 12.0–15.0)
Lymphocytes Relative: 21.9 % (ref 12.0–46.0)
Lymphs Abs: 1.1 10*3/uL (ref 0.7–4.0)
MCHC: 35.1 g/dL (ref 30.0–36.0)
MCV: 88.9 fl (ref 78.0–100.0)
Monocytes Absolute: 0.3 10*3/uL (ref 0.1–1.0)
Monocytes Relative: 5.3 % (ref 3.0–12.0)
Neutro Abs: 3.4 10*3/uL (ref 1.4–7.7)
Neutrophils Relative %: 69 % (ref 43.0–77.0)
Platelets: 195 10*3/uL (ref 150.0–400.0)
RBC: 3.89 Mil/uL (ref 3.87–5.11)
RDW: 13.6 % (ref 11.5–15.5)
WBC: 4.9 10*3/uL (ref 4.0–10.5)

## 2020-11-25 LAB — TSH: TSH: 2.52 u[IU]/mL (ref 0.35–4.50)

## 2020-11-25 LAB — LIPID PANEL
Cholesterol: 170 mg/dL (ref 0–200)
HDL: 44.5 mg/dL (ref >39.00)
LDL Cholesterol: 96 mg/dL (ref 0–99)
NonHDL: 125.31
Total CHOL/HDL Ratio: 4
Triglycerides: 148 mg/dL (ref 0.0–149.0)
VLDL: 29.6 mg/dL (ref 0.0–40.0)

## 2020-11-25 LAB — COMPREHENSIVE METABOLIC PANEL
ALT: 13 U/L (ref 0–35)
AST: 16 U/L (ref 0–37)
Albumin: 4.5 g/dL (ref 3.5–5.2)
Alkaline Phosphatase: 46 U/L (ref 39–117)
BUN: 18 mg/dL (ref 6–23)
CO2: 28 mEq/L (ref 19–32)
Calcium: 9.2 mg/dL (ref 8.4–10.5)
Chloride: 105 mEq/L (ref 96–112)
Creatinine, Ser: 0.67 mg/dL (ref 0.40–1.20)
GFR: 85.3 mL/min (ref >60.00)
Glucose, Bld: 102 mg/dL — ABNORMAL HIGH (ref 70–99)
Potassium: 4.2 mEq/L (ref 3.5–5.1)
Sodium: 141 mEq/L (ref 135–145)
Total Bilirubin: 0.8 mg/dL (ref 0.2–1.2)
Total Protein: 6.5 g/dL (ref 6.0–8.3)

## 2020-11-29 ENCOUNTER — Encounter: Payer: Self-pay | Admitting: Family Medicine

## 2020-11-29 ENCOUNTER — Ambulatory Visit (INDEPENDENT_AMBULATORY_CARE_PROVIDER_SITE_OTHER): Payer: PPO | Admitting: Family Medicine

## 2020-11-29 ENCOUNTER — Other Ambulatory Visit: Payer: Self-pay

## 2020-11-29 VITALS — BP 131/65 | HR 69 | Temp 97.0°F | Ht 65.0 in | Wt 185.4 lb

## 2020-11-29 DIAGNOSIS — I10 Essential (primary) hypertension: Secondary | ICD-10-CM

## 2020-11-29 DIAGNOSIS — Z6830 Body mass index (BMI) 30.0-30.9, adult: Secondary | ICD-10-CM

## 2020-11-29 DIAGNOSIS — F419 Anxiety disorder, unspecified: Secondary | ICD-10-CM

## 2020-11-29 DIAGNOSIS — M8580 Other specified disorders of bone density and structure, unspecified site: Secondary | ICD-10-CM

## 2020-11-29 DIAGNOSIS — E785 Hyperlipidemia, unspecified: Secondary | ICD-10-CM

## 2020-11-29 DIAGNOSIS — I350 Nonrheumatic aortic (valve) stenosis: Secondary | ICD-10-CM | POA: Diagnosis not present

## 2020-11-29 DIAGNOSIS — N907 Vulvar cyst: Secondary | ICD-10-CM | POA: Diagnosis not present

## 2020-11-29 DIAGNOSIS — K581 Irritable bowel syndrome with constipation: Secondary | ICD-10-CM | POA: Diagnosis not present

## 2020-11-29 DIAGNOSIS — Z86010 Personal history of colonic polyps: Secondary | ICD-10-CM

## 2020-11-29 DIAGNOSIS — Z8542 Personal history of malignant neoplasm of other parts of uterus: Secondary | ICD-10-CM

## 2020-11-29 DIAGNOSIS — E6609 Other obesity due to excess calories: Secondary | ICD-10-CM | POA: Diagnosis not present

## 2020-11-29 DIAGNOSIS — Z0000 Encounter for general adult medical examination without abnormal findings: Secondary | ICD-10-CM | POA: Diagnosis not present

## 2020-11-29 DIAGNOSIS — E2839 Other primary ovarian failure: Secondary | ICD-10-CM | POA: Diagnosis not present

## 2020-11-29 DIAGNOSIS — F32A Depression, unspecified: Secondary | ICD-10-CM

## 2020-11-29 MED ORDER — HYDROCHLOROTHIAZIDE 25 MG PO TABS: 25.0000 mg | ORAL_TABLET | Freq: Every day | ORAL | 3 refills | Status: DC

## 2020-11-29 MED ORDER — LUBIPROSTONE 24 MCG PO CAPS: 24.0000 ug | ORAL_CAPSULE | Freq: Two times a day (BID) | ORAL | 3 refills | Status: DC

## 2020-11-29 MED ORDER — POTASSIUM CHLORIDE ER 10 MEQ PO TBCR: 20.0000 meq | EXTENDED_RELEASE_TABLET | Freq: Two times a day (BID) | ORAL | 3 refills | Status: DC

## 2020-11-29 MED ORDER — VENLAFAXINE HCL ER 75 MG PO CP24: 75.0000 mg | ORAL_CAPSULE | Freq: Every day | ORAL | 3 refills | Status: DC

## 2020-11-29 NOTE — Assessment & Plan Note (Signed)
Very small cyst on R labia minora that looks to have drained recently  Adv to use warm compress/sitz bath as needed  If this worsens follow up

## 2020-11-29 NOTE — Assessment & Plan Note (Signed)
Plan to continue amitiza with good fluid intake

## 2020-11-29 NOTE — Assessment & Plan Note (Signed)
bp in fair control at this time  BP Readings from Last 1 Encounters:  11/29/20 131/65   No changes needed Most recent labs reviewed  Disc lifstyle change with low sodium diet and exercise  Plan to continue: toprol xl 25 mg daily  Amlodipine 5 mg daily  hctz 25 mg daily

## 2020-11-29 NOTE — Assessment & Plan Note (Signed)
Continues onc and gyn f/u  Doing well overall

## 2020-11-29 NOTE — Assessment & Plan Note (Signed)
Disc goals for lipids and reasons to control them Rev last labs with pt Rev low sat fat diet in detail LDL is now down to 96 with diet  Will continue to monitor

## 2020-11-29 NOTE — Assessment & Plan Note (Signed)
Stable with effexor xr 75 mg daily  Reviewed stressors/ coping techniques/symptoms/ support sources/ tx options and side effects in detail today

## 2020-11-29 NOTE — Assessment & Plan Note (Signed)
Reviewed health habits including diet and exercise and skin cancer prevention Reviewed appropriate screening tests for age  Also reviewed health mt list, fam hx and immunization status , as well as social and family history   See HPI Labs reviewed  Discussed shingrix vaccine, considering it  Mammogram and colonoscopy utd dexa ordered  Plans to f/u with ENT for tinnitus/ hearing aide mt and recent vertigo  Mood is good

## 2020-11-29 NOTE — Assessment & Plan Note (Signed)
Discussed how this problem influences overall health and the risks it imposes  Reviewed plan for weight loss with lower calorie diet (via better food choices and also portion control or program like weight watchers) and exercise building up to or more than 30 minutes 5 days per week including some aerobic activity    

## 2020-11-29 NOTE — Patient Instructions (Addendum)
If you are interested in the new shingles vaccine (Shingrix) - call your local pharmacy to check on coverage and availability  If affordable, get on a wait list at your pharmacy to get the vaccine.   Don't forget to schedule your bone density test   Let your cardiologist know about the chest pain that you had   For the cyst-use warm compresses or sitz bath  If more problematic let me know

## 2020-11-29 NOTE — Assessment & Plan Note (Signed)
dexa 10/18 Ordered one since she is due  No falls or fractures Enc to continue vit D and exercise as tolerated

## 2020-11-29 NOTE — Assessment & Plan Note (Signed)
Doing well s/p valve surgery  Had one episode of cp and enc pt to discuss with cardiology

## 2020-11-29 NOTE — Assessment & Plan Note (Signed)
Due for recall colonoscoy in 2024 Also has family hx in father and aunt of colon cancer

## 2020-11-29 NOTE — Progress Notes (Signed)
Subjective:    Patient ID: Veronica Greene, female    DOB: [birth year], 75 y.o.   MRN: [id number]  This visit occurred during the SARS-CoV-2 public health emergency.  Safety protocols were in place, including screening questions prior to the visit, additional usage of staff PPE, and extensive cleaning of exam room while observing appropriate contact time as indicated for disinfecting solutions.    HPI Here for health maintenance exam and to review chronic medical problems    Wt Readings from Last 3 Encounters:  11/29/20 185 lb 6.4 oz (84.1 kg)  10/17/20 187 lb (84.8 kg)  08/03/20 187 lb 12.8 oz (85.2 kg)   30.85 kg/m Doing ok  Feeling pretty good  Knees hurt a lot- uses bio freeze   covid immunized with booster  Flu shot 10/21 Tdap 12/19 Zoster status -zostavax 2/10  pna vaccine utd  Mammogram 12/21 Self breast exam -no lumps  H/o endometrial cancer-has her onc check next month  Doing well  dexa 10/18 Osteopenia Falls -none new Fractures-none Supplements -vit D Exercise -walking and exercise bike  Colon cancer screening -colonoscopy 5/21 Personal h/o polyps Due for recall 2024 Father had colon cancer at 74 Maunt had colon cancer at 54 Takes amitiza for IBS  Wears hearing aides Tinnitus drives her crazy  HTN bp is stable today  No cp or palpitations or headaches or edema  No side effects to medicines  BP Readings from Last 3 Encounters:  11/29/20 131/65  10/17/20 (!) 138/54  08/03/20 (!) 154/79    Has not taken med yet this am  No issues with her valve that she knows of  Had some chest pain under L breast last week - thinks it was gas    toprol xl 25 mg daily Amlodipine 5 mg daily  hctz 25 mg daily  klor con 20 meq bid   Mood Takes effexor xr 75 mg daily for anxiety with depression   Hyperlipidemia Lab Results  Component Value Date   CHOL 170 11/25/2020   CHOL 178 11/20/2019   CHOL 181 11/20/2018   Lab Results  Component Value Date    HDL 44.50 11/25/2020   HDL 46.00 11/20/2019   HDL 46.90 11/20/2018   Lab Results  Component Value Date   LDLCALC 96 11/25/2020   LDLCALC 111 (H) 11/20/2019   LDLCALC 105 (H) 11/20/2018   Lab Results  Component Value Date   TRIG 148.0 11/25/2020   TRIG 107.0 11/20/2019   TRIG 147.0 11/20/2018   Lab Results  Component Value Date   CHOLHDL 4 11/25/2020   CHOLHDL 4 11/20/2019   CHOLHDL 4 11/20/2018   No results found for: LDLDIRECT  Managed by cardiology and would like to avoid statin  Mentioned nexletol or nexlizet as option in the future LDL is down to 96 with diet  Trying to eat better  For exercise -rides stationary bike frequently   Other labs Results for orders placed or performed in visit on 11/25/20  TSH  Result Value Ref Range   TSH 2.52 0.35 - 4.50 uIU/mL  Lipid panel  Result Value Ref Range   Cholesterol 170 0 - 200 mg/dL   Triglycerides 148.0 0.0 - 149.0 mg/dL   HDL 44.50 >39.00 mg/dL   VLDL 29.6 0.0 - 40.0 mg/dL   LDL Cholesterol 96 0 - 99 mg/dL   Total CHOL/HDL Ratio 4    NonHDL 125.31   Comprehensive metabolic panel  Result Value Ref Range   Sodium 141 135 -  145 mEq/L   Potassium 4.2 3.5 - 5.1 mEq/L   Chloride 105 96 - 112 mEq/L   CO2 28 19 - 32 mEq/L   Glucose, Bld 102 (H) 70 - 99 mg/dL   BUN 18 6 - 23 mg/dL   Creatinine, Ser 0.67 0.40 - 1.20 mg/dL   Total Bilirubin 0.8 0.2 - 1.2 mg/dL   Alkaline Phosphatase 46 39 - 117 U/L   AST 16 0 - 37 U/L   ALT 13 0 - 35 U/L   Total Protein 6.5 6.0 - 8.3 g/dL   Albumin 4.5 3.5 - 5.2 g/dL   GFR 85.30 >60.00 mL/min   Calcium 9.2 8.4 - 10.5 mg/dL  CBC with Differential/Platelet  Result Value Ref Range   WBC 4.9 4.0 - 10.5 K/uL   RBC 3.89 3.87 - 5.11 Mil/uL   Hemoglobin 12.1 12.0 - 15.0 g/dL   HCT 34.5 (L) 36.0 - 46.0 %   MCV 88.9 78.0 - 100.0 fl   MCHC 35.1 30.0 - 36.0 g/dL   RDW 13.6 11.5 - 15.5 %   Platelets 195.0 150.0 - 400.0 K/uL   Neutrophils Relative % 69.0 43.0 - 77.0 %   Lymphocytes  Relative 21.9 12.0 - 46.0 %   Monocytes Relative 5.3 3.0 - 12.0 %   Eosinophils Relative 3.4 0.0 - 5.0 %   Basophils Relative 0.4 0.0 - 3.0 %   Neutro Abs 3.4 1.4 - 7.7 K/uL   Lymphs Abs 1.1 0.7 - 4.0 K/uL   Monocytes Absolute 0.3 0.1 - 1.0 K/uL   Eosinophils Absolute 0.2 0.0 - 0.7 K/uL   Basophils Absolute 0.0 0.0 - 0.1 K/uL     Has a cyst in pelvic area-occ bleeds or bothers her   occ vertigo   Patient Active Problem List   Diagnosis Date Noted  . Cyst, vulva 11/29/2020  . Multiple falls 03/30/2020  . Lower extremity edema 02/21/2020  . Endometrial cancer ([place]) 09/21/2019  . S/P TAVR (transcatheter aortic valve replacement)   . Poor balance 11/25/2018  . History of endometrial cancer 09/12/2017  . Essential hypertension 08/29/2017  . Severe calcific aortic stenosis 08/27/2017  . Estrogen deficiency 11/05/2016  . Routine general medical examination at a health care facility 10/15/2015  . Hyperlipidemia with target LDL less than 100 09/02/2012  . Degenerative disk disease 08/31/2011  . Personal history of colonic polyps 11/03/2010  . Gout 08/25/2010  . DERMATITIS, SEBORRHEIC 08/25/2010  . Osteopenia 09/05/2007  . History of poliomyelitis 08/26/2007  . Obesity 08/26/2007  . Anxiety and depression 08/26/2007  . ALLERGIC RHINITIS 08/26/2007  . GERD 08/26/2007  . IBS 08/26/2007  . OVERACTIVE BLADDER 08/26/2007  . OSTEOARTHRITIS, HANDS, BILATERAL 08/26/2007  . URINARY INCONTINENCE 08/26/2007   Past Medical History:  Diagnosis Date  . Anxiety and depression 08/26/2007   Qualifier: Diagnosis of  By: Glori Bickers MD, Carmell Austria   . Arthritis    knees  . Constipation, slow transit 11/03/2010  . Degenerative disk disease 08/31/2011  . Depression with anxiety   . DERMATITIS, SEBORRHEIC 08/25/2010   Qualifier: Diagnosis of  By: Glori Bickers MD, Carmell Austria   . Endometrial cancer [facility])    02/ 2019  Diagnosed with D&C/hysteroscopy with polypectomy  . Essential hypertension 08/29/2017  .  Fibromyalgia    tx mobic  . Full dentures   . GERD 08/26/2007   Qualifier: Diagnosis of  By: Glori Bickers MD, Carmell Austria   . Gout   . Hearing loss    wears bilateral hearing  aids  . History of colon polyps   . History of nonmelanoma skin cancer    right lower leg  . History of poliomyelitis 08/26/2007   Qualifier: History of  By: Glori Bickers MD, Carmell Austria   . Hyperlipidemia, mild 09/02/2012  . Hypertension   . Hypokalemia 01/02/2011  . IBS (irritable bowel syndrome)    with constipation  . Obesity 08/26/2007   Qualifier: Diagnosis of  By: Glori Bickers MD, Carmell Austria   . OSTEOARTHRITIS, HANDS, BILATERAL 08/26/2007   Qualifier: Diagnosis of  By: Glori Bickers MD, Carmell Austria   . Osteopenia   . OVERACTIVE BLADDER 08/26/2007   Qualifier: Diagnosis of  By: Glori Bickers MD, Carmell Austria   . Personal history of colonic polyps 11/03/2010   05/2018 13 polyps mostly adenomas, ssp's - recall 1 year 2020 Gatha Mayer, MD, [facility]   . Poor balance 11/25/2018   With falls  Has rise in R shoe/post polio  Weak legs as well    . S/P TAVR (transcatheter aortic valve replacement)    s/p TAVR with a 63m Edwards S3U via the TF approach by Dr. BCyndia Bentand Dr. MAngelena Form . Severe calcific aortic stenosis 08/27/2017    -> Became severe as of October 2020-referred for TAVR on 04/28/2019  . URINARY INCONTINENCE 08/26/2007   Qualifier: Diagnosis of  By: TGlori BickersMD, MCarmell Austria  . Wears hearing aid in both ears    Past Surgical History:  Procedure Laterality Date  . ANKLE SURGERY Right 1956  . CARPAL TUNNEL RELEASE Bilateral 1986, 1993  . CATARACT EXTRACTION Right 11/16/2019  . COLONOSCOPY N/A 06/24/2015   Procedure: COLONOSCOPY;  Surgeon: CGatha Mayer MD;  Location: [facility];  Service: Endoscopy;  Laterality: N/A;  . DILATATION & CURETTAGE/HYSTEROSCOPY WITH MYOSURE N/A 08/09/2017   Procedure: DILATATION & CURETTAGE/HYSTEROSCOPY WITH MYOSURE;  Surgeon: CChristophe Louis MD;  [facility];  Service: Gynecology;  Laterality: N/A;  Polypectomy  .  DILATION AND CURETTAGE OF UTERUS     PMB  . HAND SURGERY Bilateral 1999   Thumbs   . KNEE SURGERY Bilateral 1998   x2  . LYMPH NODE BIOPSY N/A 10/08/2017   Procedure: LYMPH NODE BIOPSY;  Surgeon: REveritt Amber MD;  Location: [facility];  Service: Gynecology;  Laterality: N/A;  . MULTIPLE TOOTH EXTRACTIONS    . NASAL SINUS SURGERY  1976  . polio surgery.     right foot - right leg 1.5" shorter than left leg - some wekness  . RIGHT/LEFT HEART CATH AND CORONARY ANGIOGRAPHY N/A 04/09/2019   Procedure: RIGHT/LEFT HEART CATH AND CORONARY ANGIOGRAPHY;  Surgeon: HLeonie Man MD;  [facility];  Service: Cardiovascular;; Severe AS, mean gradient 50 mmHg P-P 55 mmHg (estimated AVA 0.76 cm).  Mildly elevated pulmonary pressures with elevated LVEDP and PCWP.  Angiographically normal coronary arteries, but tortuous.  . ROBOTIC ASSISTED TOTAL HYSTERECTOMY WITH BILATERAL SALPINGO OOPHERECTOMY Bilateral 10/08/2017   Procedure: XI ROBOTIC ASSISTED TOTAL HYSTERECTOMY WITH BILATERAL SALPINGO OOPHORECTOMY;  Surgeon: REveritt Amber MD;  Location: [facility];  Service: Gynecology;  Laterality: Bilateral;  . TEE WITHOUT CARDIOVERSION N/A 04/28/2019   Procedure: TRANSESOPHAGEAL ECHOCARDIOGRAM (TEE);  Surgeon: MBurnell Blanks MD;  [facility];  Service: Open Heart Surgery;  Laterality: N/A;  . THUMB ARTHROSCOPY  1999  . TONSILLECTOMY  1494  75years old  . TRANSCATHETER AORTIC VALVE REPLACEMENT, TRANSFEMORAL N/A 04/28/2019   Procedure: TRANSCATHETER AORTIC VALVE REPLACEMENT, TRANSFEMORAL;  Surgeon: MBurnell Blanks  MD;  [facility]; ; 23 mm Sapien 3 Ultra THV via TF approach. (post-op mean AV gradient 13 mmHg)  . TRANSTHORACIC ECHOCARDIOGRAM  05/04/2020   EF 60 to 65%.  Normal LV function and wall motion.  Indeterminate diastolic parameters.  Normal RV size and pressure.  Moderate LA dilation.  Moderate MAC but no significant MR.  Post TAVR with 23 mm Sapien 3  valve: Mean gradient 14 mmHg.  No PVL.  Normal IVC  . TRANSTHORACIC ECHOCARDIOGRAM  02/17/2019   Mod LVH. GR 2 DD. Severe AS (mean gradient = 44 mmHg with Mod AI. mild MS.  Marland Kitchen TRANSTHORACIC ECHOCARDIOGRAM  04/29/2019; 05/2020   a)  (post TAVR): EF 60-65%.  GRII DD.  Mod LA dilation & nl RA.  Mod MAC.  No  AI (no-PVL), mean AV gradient 13 mmHg.;; b) (2nd post TAVR) EF 60 to 65%.  Severely increased thickness/LVH.  GRII DD with only mildly elevated left atrial pressure.  Normal RA size.  Severe MAC with only trace MR.  No MS.  TAVR valve prosthesis functioning well.  Trivial AI.  Mean gradient 15 mmHg with a peak 29.8 mmHg  . TUBAL LIGATION    . UPPER GI ENDOSCOPY     normal  . WISDOM TOOTH EXTRACTION     Social History   Tobacco Use  . Smoking status: Never Smoker  . Smokeless tobacco: Never Used  Vaping Use  . Vaping Use: Never used  Substance Use Topics  . Alcohol use: No    Alcohol/week: 0.0 standard drinks  . Drug use: No   Family History  Problem Relation Age of Onset  . Kidney disease Mother   . Alzheimer's disease Mother   . Stroke Father 63  . Colon cancer Father 56       possibly colon cancer, possibly just polyps  . Kidney disease Sister   . Heart attack Sister   . Colon cancer Paternal Aunt 50  . Cancer Paternal Uncle        unk type  . Esophageal cancer Neg Hx   . Stomach cancer Neg Hx   . Rectal cancer Neg Hx    Allergies  Allergen Reactions  . Clarithromycin Swelling and Other (See Comments)    Throat Swells  . Penicillins Hives and Other (See Comments)    WELTS Has patient had a PCN reaction causing immediate rash, facial/tongue/throat swelling, SOB or lightheadedness with hypotension: No Has patient had a PCN reaction causing severe rash involving mucus membranes or skin necrosis: Unknown Has patient had a PCN reaction that required hospitalization:No Has patient had a PCN reaction occurring within the last 10 years: No If all of the above answers are  "NO", then may proceed with Cephalosporin use.   . Codeine Nausea And Vomiting   Current Outpatient Medications on File Prior to Visit  Medication Sig Dispense Refill  . acetaminophen (TYLENOL) 325 MG tablet Take 650 mg by mouth every 6 (six) hours as needed (for pain.).    Marland Kitchen acetic acid-hydrocortisone (VOSOL-HC) OTIC solution Place 2 drops into both ears 2 (two) times daily as needed (itching).    Marland Kitchen amLODipine (NORVASC) 5 MG tablet Take 1 tablet (5 mg total) by mouth daily. 90 tablet 3  . Ascorbic Acid (VITAMIN C) 1000 MG tablet Take 1,000 mg by mouth daily with breakfast.    . aspirin EC 81 MG tablet Take 81 mg by mouth daily.    . Black Pepper-Turmeric (TURMERIC COMPLEX/BLACK PEPPER  PO) Take 15 mLs by mouth daily with breakfast.     . Calcium-Phosphorus-Vitamin D (CALCIUM/VITAMIN D3/ADULT GUMMY PO) Take 1 tablet by mouth 2 (two) times daily.     . cholecalciferol (VITAMIN D) 1000 UNITS tablet Take 1,000 Units by mouth daily with breakfast.     . D 1000 25 MCG (1000 UT) capsule 1 Add'l Sig oral Select Frequency    . fluticasone (FLONASE) 50 MCG/ACT nasal spray Place 2 sprays into both nostrils at bedtime.     . furosemide (LASIX) 20 MG tablet May take 20 mg as needed up to three times a week  Do not take HCTZ that day 30 tablet 11  . GUAIFENESIN 1200 PO Take 1,200 mg by mouth at bedtime.     . meloxicam (MOBIC) 7.5 MG tablet TAKE 1 TABLET BY MOUTH AT BEDTIME. 90 tablet 3  . metoprolol succinate (TOPROL-XL) 25 MG 24 hr tablet TAKE 1 TABLET BY MOUTH DAILY. 90 tablet 3  . Multiple Vitamin (MULTIVITAMIN WITH MINERALS) TABS tablet Take 1 tablet by mouth daily.     Marland Kitchen NEXIUM 40 MG capsule 1 Add'l Sig oral Select Frequency    . Polyethyl Glycol-Propyl Glycol 0.4-0.3 % SOLN Place 1-2 drops into both eyes 2 (two) times daily.     . Simethicone (PHAZYME PO) Take 1 tablet by mouth at bedtime as needed (for gas).     . sodium chloride (OCEAN) 0.65 % nasal spray Place 1 spray into the nose daily.    Marland Kitchen  triamcinolone (KENALOG) 0.1 % Apply topically.    . cetirizine (ZYRTEC) 10 MG tablet Take 5 mg by mouth as needed for allergies.  (Patient not taking: Reported on 11/29/2020)     Current Facility-Administered Medications on File Prior to Visit  Medication Dose Route Frequency Provider Last Rate Last Admin  . 0.9 %  sodium chloride infusion  500 mL Intravenous Once Gatha Mayer, MD        Review of Systems  Constitutional: Negative for activity change, appetite change, fatigue, fever and unexpected weight change.  HENT: Negative for congestion, ear pain, rhinorrhea, sinus pressure and sore throat.   Eyes: Negative for pain, redness and visual disturbance.  Respiratory: Negative for cough, shortness of breath and wheezing.   Cardiovascular: Negative for chest pain and palpitations.  Gastrointestinal: Negative for abdominal pain, blood in stool, constipation and diarrhea.  Endocrine: Negative for polydipsia and polyuria.  Genitourinary: Negative for dysuria, frequency and urgency.       Small labial cyst  Musculoskeletal: Positive for arthralgias. Negative for back pain and myalgias.  Skin: Negative for pallor and rash.  Allergic/Immunologic: Negative for environmental allergies.  Neurological: Positive for dizziness. Negative for syncope and headaches.       Had an episode of vertigo, better now  Hematological: Negative for adenopathy. Does not bruise/bleed easily.  Psychiatric/Behavioral: Negative for decreased concentration and dysphoric mood. The patient is not nervous/anxious.        Objective:   Physical Exam Constitutional:      General: She is not in acute distress.    Appearance: Normal appearance. She is well-developed. She is obese. She is not ill-appearing or diaphoretic.  HENT:     Head: Normocephalic and atraumatic.     Right Ear: Tympanic membrane, ear canal and external ear normal.     Left Ear: Tympanic membrane, ear canal and external ear normal.     Ears:      Comments: Wears hearing aides No cerumen  Nose: Nose normal. No congestion.     Mouth/Throat:     Mouth: Mucous membranes are moist.     Pharynx: Oropharynx is clear. No posterior oropharyngeal erythema.  Eyes:     General: No scleral icterus.    Extraocular Movements: Extraocular movements intact.     Conjunctiva/sclera: Conjunctivae normal.     Pupils: Pupils are equal, round, and reactive to light.  Neck:     Thyroid: No thyromegaly.     Vascular: No carotid bruit or JVD.  Cardiovascular:     Rate and Rhythm: Normal rate and regular rhythm.     Pulses: Normal pulses.     Heart sounds: Normal heart sounds. No gallop.   Pulmonary:     Effort: Pulmonary effort is normal. No respiratory distress.     Breath sounds: Normal breath sounds. No wheezing.     Comments: Good air exch Chest:     Chest wall: No tenderness.  Abdominal:     General: Bowel sounds are normal. There is no distension or abdominal bruit.     Palpations: Abdomen is soft. There is no mass.     Tenderness: There is no abdominal tenderness.     Hernia: No hernia is present.  Genitourinary:    Comments: Breast exam: No mass, nodules, thickening, tenderness, bulging, retraction, inflamation, nipple discharge or skin changes noted.  No axillary or clavicular LA.      Small (less than 1 cm) cyst on R posterior labial minora that has recently drained (no active drainage now) Musculoskeletal:        General: No tenderness. Normal range of motion.     Cervical back: Normal range of motion and neck supple. No rigidity. No muscular tenderness.     Right lower leg: No edema.     Left lower leg: No edema.     Comments: No kyphosis   Wearing compression sleeve on R leg Trace pedal edema  Lymphadenopathy:     Cervical: No cervical adenopathy.  Skin:    General: Skin is warm and dry.     Coloration: Skin is not pale.     Findings: No erythema or rash.     Comments: Solar lentigines diffusely Scattered SKs   Neurological:     Mental Status: She is alert. Mental status is at baseline.     Cranial Nerves: No cranial nerve deficit.     Motor: No abnormal muscle tone.     Coordination: Coordination normal.     Gait: Gait normal.     Deep Tendon Reflexes: Reflexes are normal and symmetric. Reflexes normal.  Psychiatric:        Mood and Affect: Mood normal.        Cognition and Memory: Cognition and memory normal.           Assessment & Plan:   Problem List Items Addressed This Visit      Cardiovascular and Mediastinum   Severe calcific aortic stenosis (Chronic)    Doing well s/p valve surgery  Had one episode of cp and enc pt to discuss with cardiology       Relevant Medications   hydrochlorothiazide (HYDRODIURIL) 25 MG tablet   Essential hypertension (Chronic)    bp in fair control at this time  BP Readings from Last 1 Encounters:  11/29/20 131/65   No changes needed Most recent labs reviewed  Disc lifstyle change with low sodium diet and exercise  Plan to continue: toprol xl 25 mg daily  Amlodipine 5 mg daily  hctz 25 mg daily        Relevant Medications   hydrochlorothiazide (HYDRODIURIL) 25 MG tablet     Digestive   IBS    Plan to continue amitiza with good fluid intake      Relevant Medications   lubiprostone (AMITIZA) 24 MCG capsule     Musculoskeletal and Integument   Osteopenia    dexa 10/18 Ordered one since she is due  No falls or fractures Enc to continue vit D and exercise as tolerated         Genitourinary   Cyst, vulva    Very small cyst on R labia minora that looks to have drained recently  Adv to use warm compress/sitz bath as needed  If this worsens follow up        Other   Hyperlipidemia with target LDL less than 100 (Chronic)    Disc goals for lipids and reasons to control them Rev last labs with pt Rev low sat fat diet in detail LDL is now down to 96 with diet  Will continue to monitor      Relevant Medications    hydrochlorothiazide (HYDRODIURIL) 25 MG tablet   Obesity    Discussed how this problem influences overall health and the risks it imposes  Reviewed plan for weight loss with lower calorie diet (via better food choices and also portion control or program like weight watchers) and exercise building up to or more than 30 minutes 5 days per week including some aerobic activity         Anxiety and depression    Stable with effexor xr 75 mg daily  Reviewed stressors/ coping techniques/symptoms/ support sources/ tx options and side effects in detail today        Relevant Medications   venlafaxine XR (EFFEXOR XR) 75 MG 24 hr capsule   Personal history of colonic polyps    Due for recall colonoscoy in 2024 Also has family hx in father and aunt of colon cancer      Routine general medical examination at a health care facility - Primary    Reviewed health habits including diet and exercise and skin cancer prevention Reviewed appropriate screening tests for age  Also reviewed health mt list, fam hx and immunization status , as well as social and family history   See HPI Labs reviewed  Discussed shingrix vaccine, considering it  Mammogram and colonoscopy utd dexa ordered  Plans to f/u with ENT for tinnitus/ hearing aide mt and recent vertigo  Mood is good      Estrogen deficiency   Relevant Orders   DG Bone Density   History of endometrial cancer    Continues onc and gyn f/u  Doing well overall

## 2020-12-14 ENCOUNTER — Ambulatory Visit (INDEPENDENT_AMBULATORY_CARE_PROVIDER_SITE_OTHER): Payer: PPO

## 2020-12-14 ENCOUNTER — Other Ambulatory Visit: Payer: Self-pay

## 2020-12-14 DIAGNOSIS — Z0000 Encounter for general adult medical examination without abnormal findings: Secondary | ICD-10-CM | POA: Diagnosis not present

## 2020-12-14 NOTE — Patient Instructions (Signed)
Veronica Greene , Thank you for taking time to come for your Medicare Wellness Visit. I appreciate your ongoing commitment to your health goals. Please review the following plan we discussed and let me know if I can assist you in the future.   Screening recommendations/referrals: Colonoscopy: Up to date, completed 11/10/2019, due 11/2022 Mammogram: Up to date, completed 06/27/2020, due 06/2021 Bone Density: ordered 11/29/2020, Please call and scheduled appointment  Recommended yearly ophthalmology/optometry visit for glaucoma screening and checkup Recommended yearly dental visit for hygiene and checkup  Vaccinations: Influenza vaccine: Up to date, completed 05/04/2020, due 02/2021 Pneumococcal vaccine: Completed series Tdap vaccine: Up to date, completed 06/18/2018, due 06/2028 Shingles vaccine: due, check with your insurance regarding coverage if interested  Covid-19: 3 vaccines completed, Patient aware of second booster recommendations  Advanced directives: Please bring a copy of your POA (Power of [place]) and/or Living Will to your next appointment.   Conditions/risks identified: hypertension, hyperlipidemia   Next appointment: Follow up in one year for your annual wellness visit    Preventive Care 22 Years and Older, Female Preventive care refers to lifestyle choices and visits with your health care provider that can promote health and wellness. What does preventive care include?  A yearly physical exam. This is also called an annual well check.  Dental exams once or twice a year.  Routine eye exams. Ask your health care provider how often you should have your eyes checked.  Personal lifestyle choices, including:  Daily care of your teeth and gums.  Regular physical activity.  Eating a healthy diet.  Avoiding tobacco and drug use.  Limiting alcohol use.  Practicing safe sex.  Taking low-dose aspirin every day.  Taking vitamin and mineral supplements as recommended by  your health care provider. What happens during an annual well check? The services and screenings done by your health care provider during your annual well check will depend on your age, overall health, lifestyle risk factors, and family history of disease. Counseling  Your health care provider may ask you questions about your:  Alcohol use.  Tobacco use.  Drug use.  Emotional well-being.  Home and relationship well-being.  Sexual activity.  Eating habits.  History of falls.  Memory and ability to understand (cognition).  Work and work Statistician.  Reproductive health. Screening  You may have the following tests or measurements:  Height, weight, and BMI.  Blood pressure.  Lipid and cholesterol levels. These may be checked every 5 years, or more frequently if you are over 74 years old.  Skin check.  Lung cancer screening. You may have this screening every year starting at age 7 if you have a 30-pack-year history of smoking and currently smoke or have quit within the past 15 years.  Fecal occult blood test (FOBT) of the stool. You may have this test every year starting at age 71.  Flexible sigmoidoscopy or colonoscopy. You may have a sigmoidoscopy every 5 years or a colonoscopy every 10 years starting at age 65.  Hepatitis C blood test.  Hepatitis B blood test.  Sexually transmitted disease (STD) testing.  Diabetes screening. This is done by checking your blood sugar (glucose) after you have not eaten for a while (fasting). You may have this done every 1-3 years.  Bone density scan. This is done to screen for osteoporosis. You may have this done starting at age 53.  Mammogram. This may be done every 1-2 years. Talk to your health care provider about how often you should  have regular mammograms. Talk with your health care provider about your test results, treatment options, and if necessary, the need for more tests. Vaccines  Your health care provider may  recommend certain vaccines, such as:  Influenza vaccine. This is recommended every year.  Tetanus, diphtheria, and acellular pertussis (Tdap, Td) vaccine. You may need a Td booster every 10 years.  Zoster vaccine. You may need this after age 61.  Pneumococcal 13-valent conjugate (PCV13) vaccine. One dose is recommended after age 7.  Pneumococcal polysaccharide (PPSV23) vaccine. One dose is recommended after age 83. Talk to your health care provider about which screenings and vaccines you need and how often you need them. This information is not intended to replace advice given to you by your health care provider. Make sure you discuss any questions you have with your health care provider. Document Released: 07/22/2015 Document Revised: 03/14/2016 Document Reviewed: 04/26/2015 Elsevier Interactive Patient Education  2017 [place] Prevention in the Home Falls can cause injuries. They can happen to people of all ages. There are many things you can do to make your home safe and to help prevent falls. What can I do on the outside of my home?  Regularly fix the edges of walkways and driveways and fix any cracks.  Remove anything that might make you trip as you walk through a door, such as a raised step or threshold.  Trim any bushes or trees on the path to your home.  Use bright outdoor lighting.  Clear any walking paths of anything that might make someone trip, such as rocks or tools.  Regularly check to see if handrails are loose or broken. Make sure that both sides of any steps have handrails.  Any raised decks and porches should have guardrails on the edges.  Have any leaves, snow, or ice cleared regularly.  Use sand or salt on walking paths during winter.  Clean up any spills in your garage right away. This includes oil or grease spills. What can I do in the bathroom?  Use night lights.  Install grab bars by the toilet and in the tub and shower. Do not use towel  bars as grab bars.  Use non-skid mats or decals in the tub or shower.  If you need to sit down in the shower, use a plastic, non-slip stool.  Keep the floor dry. Clean up any water that spills on the floor as soon as it happens.  Remove soap buildup in the tub or shower regularly.  Attach bath mats securely with double-sided non-slip rug tape.  Do not have throw rugs and other things on the floor that can make you trip. What can I do in the bedroom?  Use night lights.  Make sure that you have a light by your bed that is easy to reach.  Do not use any sheets or blankets that are too big for your bed. They should not hang down onto the floor.  Have a firm chair that has side arms. You can use this for support while you get dressed.  Do not have throw rugs and other things on the floor that can make you trip. What can I do in the kitchen?  Clean up any spills right away.  Avoid walking on wet floors.  Keep items that you use a lot in easy-to-reach places.  If you need to reach something above you, use a strong step stool that has a grab bar.  Keep electrical cords out of  the way.  Do not use floor polish or wax that makes floors slippery. If you must use wax, use non-skid floor wax.  Do not have throw rugs and other things on the floor that can make you trip. What can I do with my stairs?  Do not leave any items on the stairs.  Make sure that there are handrails on both sides of the stairs and use them. Fix handrails that are broken or loose. Make sure that handrails are as long as the stairways.  Check any carpeting to make sure that it is firmly attached to the stairs. Fix any carpet that is loose or worn.  Avoid having throw rugs at the top or bottom of the stairs. If you do have throw rugs, attach them to the floor with carpet tape.  Make sure that you have a light switch at the top of the stairs and the bottom of the stairs. If you do not have them, ask someone to  add them for you. What else can I do to help prevent falls?  Wear shoes that:  Do not have high heels.  Have rubber bottoms.  Are comfortable and fit you well.  Are closed at the toe. Do not wear sandals.  If you use a stepladder:  Make sure that it is fully opened. Do not climb a closed stepladder.  Make sure that both sides of the stepladder are locked into place.  Ask someone to hold it for you, if possible.  Clearly mark and make sure that you can see:  Any grab bars or handrails.  First and last steps.  Where the edge of each step is.  Use tools that help you move around (mobility aids) if they are needed. These include:  Canes.  Walkers.  Scooters.  Crutches.  Turn on the lights when you go into a dark area. Replace any light bulbs as soon as they burn out.  Set up your furniture so you have a clear path. Avoid moving your furniture around.  If any of your floors are uneven, fix them.  If there are any pets around you, be aware of where they are.  Review your medicines with your doctor. Some medicines can make you feel dizzy. This can increase your chance of falling. Ask your doctor what other things that you can do to help prevent falls. This information is not intended to replace advice given to you by your health care provider. Make sure you discuss any questions you have with your health care provider. Document Released: 04/21/2009 Document Revised: 12/01/2015 Document Reviewed: 07/30/2014 Elsevier Interactive Patient Education  2017 [employer].

## 2020-12-14 NOTE — Progress Notes (Signed)
PCP notes:  Health Maintenance: dexa- due shingrix- due   Abnormal Screenings: none   Patient concerns: none   Nurse concerns: none   Next PCP appt.: none

## 2020-12-14 NOTE — Progress Notes (Signed)
Subjective:   Veronica Greene is a 75 y.o. female who presents for Medicare Annual (Subsequent) preventive examination.  Review of Systems: N/A      I connected with the patient today by telephone and verified that I am speaking with the correct person using two identifiers. Location patient: home Location nurse: work Persons participating in the telephone visit: patient, nurse.   I discussed the limitations, risks, security and privacy concerns of performing an evaluation and management service by telephone and the availability of in person appointments. I also discussed with the patient that there may be a patient responsible charge related to this service. The patient expressed understanding and verbally consented to this telephonic visit.        Cardiac Risk Factors include: advanced age (>16mn, >>73women);hypertension;Other (see comment), Risk factor comments: hyperlipidemia     Objective:    Today's Vitals   There is no height or weight on file to calculate BMI.  Advanced Directives 12/14/2020 06/23/2020 11/24/2019 09/21/2019 06/25/2019 04/28/2019 04/24/2019  Does Patient Have a Medical Advance Directive? _0  Yes Yes  Type of AParamedicof [place]Living will [place]Living will [place]burgLiving will [place]Living will - [place]burgLiving will HGir[place]Living will  Does patient want to make changes to medical advance directive? - - - - - No - Patient declined No - Patient declined  Copy of HRocky Boy Westin Chart? No - copy requested No - copy requested No - copy requested No - copy requested - No - copy requested No - copy requested    Current Medications (verified) Outpatient Encounter Medications as of 12/14/2020  Medication Sig  . acetaminophen (TYLENOL) 325 MG tablet Take 650 mg by mouth every 6 (six) hours as  needed (for pain.).  .Marland Kitchenacetic acid-hydrocortisone (VOSOL-HC) OTIC solution Place 2 drops into both ears 2 (two) times daily as needed (itching).  .Marland KitchenamLODipine (NORVASC) 5 MG tablet Take 1 tablet (5 mg total) by mouth daily.  . Ascorbic Acid (VITAMIN C) 1000 MG tablet Take 1,000 mg by mouth daily with breakfast.  . aspirin EC 81 MG tablet Take 81 mg by mouth daily.  . Black Pepper-Turmeric (TURMERIC COMPLEX/BLACK PEPPER PO) Take 15 mLs by mouth daily with breakfast.   . Calcium-Phosphorus-Vitamin D (CALCIUM/VITAMIN D3/ADULT GUMMY PO) Take 1 tablet by mouth 2 (two) times daily.   . cetirizine (ZYRTEC) 10 MG tablet Take 5 mg by mouth as needed for allergies.  . cholecalciferol (VITAMIN D) 1000 UNITS tablet Take 1,000 Units by mouth daily with breakfast.   . D 1000 25 MCG (1000 UT) capsule 1 Add'l Sig oral Select Frequency  . fluticasone (FLONASE) 50 MCG/ACT nasal spray Place 2 sprays into both nostrils at bedtime.   . furosemide (LASIX) 20 MG tablet May take 20 mg as needed up to three times a week  Do not take HCTZ that day  . GUAIFENESIN 1200 PO Take 1,200 mg by mouth at bedtime.   . hydrochlorothiazide (HYDRODIURIL) 25 MG tablet Take 1 tablet (25 mg total) by mouth daily with lunch.  . lubiprostone (AMITIZA) 24 MCG capsule Take 1 capsule (24 mcg total) by mouth 2 (two) times daily. LUNCH & SUPPER  . meloxicam (MOBIC) 7.5 MG tablet TAKE 1 TABLET BY MOUTH AT BEDTIME.  . metoprolol succinate (TOPROL-XL) 25 MG 24 hr tablet TAKE 1 TABLET BY MOUTH DAILY.  . Multiple Vitamin (  MULTIVITAMIN WITH MINERALS) TABS tablet Take 1 tablet by mouth daily.   Marland Kitchen NEXIUM 40 MG capsule 1 Add'l Sig oral Select Frequency  . Polyethyl Glycol-Propyl Glycol 0.4-0.3 % SOLN Place 1-2 drops into both eyes 2 (two) times daily.   . potassium chloride (KLOR-CON) 10 MEQ tablet Take 2 tablets (20 mEq total) by mouth 2 (two) times daily.  . Simethicone (PHAZYME PO) Take 1 tablet by mouth at bedtime as needed (for gas).   . sodium  chloride (OCEAN) 0.65 % nasal spray Place 1 spray into the nose daily.  Marland Kitchen triamcinolone (KENALOG) 0.1 % Apply topically.  . venlafaxine XR (EFFEXOR XR) 75 MG 24 hr capsule Take 1 capsule (75 mg total) by mouth daily after breakfast.   Facility-Administered Encounter Medications as of 12/14/2020  Medication  . 0.9 %  sodium chloride infusion    Allergies (verified) Clarithromycin, Penicillins, and Codeine   History: Past Medical History:  Diagnosis Date  . Anxiety and depression 08/26/2007   Qualifier: Diagnosis of  By: Glori Bickers MD, Carmell Austria   . Arthritis    knees  . Constipation, slow transit 11/03/2010  . Degenerative disk disease 08/31/2011  . Depression with anxiety   . DERMATITIS, SEBORRHEIC 08/25/2010   Qualifier: Diagnosis of  By: Glori Bickers MD, Carmell Austria   . Endometrial cancer [facility])    02/ 2019  Diagnosed with D&C/hysteroscopy with polypectomy  . Essential hypertension 08/29/2017  . Fibromyalgia    tx mobic  . Full dentures   . GERD 08/26/2007   Qualifier: Diagnosis of  By: Glori Bickers MD, Carmell Austria   . Gout   . Hearing loss    wears bilateral hearing aids  . History of colon polyps   . History of nonmelanoma skin cancer    right lower leg  . History of poliomyelitis 08/26/2007   Qualifier: History of  By: Glori Bickers MD, Carmell Austria   . Hyperlipidemia, mild 09/02/2012  . Hypertension   . Hypokalemia 01/02/2011  . IBS (irritable bowel syndrome)    with constipation  . Obesity 08/26/2007   Qualifier: Diagnosis of  By: Glori Bickers MD, Carmell Austria   . OSTEOARTHRITIS, HANDS, BILATERAL 08/26/2007   Qualifier: Diagnosis of  By: Glori Bickers MD, Carmell Austria   . Osteopenia   . OVERACTIVE BLADDER 08/26/2007   Qualifier: Diagnosis of  By: Glori Bickers MD, Carmell Austria   . Personal history of colonic polyps 11/03/2010   05/2018 13 polyps mostly adenomas, ssp's - recall 1 year 2020 Gatha Mayer, MD, [facility]   . Poor balance 11/25/2018   With falls  Has rise in R shoe/post polio  Weak legs as well    . S/P TAVR (transcatheter  aortic valve replacement)    s/p TAVR with a 59m Edwards S3U via the TF approach by Dr. BCyndia Bentand Dr. MAngelena Form . Severe calcific aortic stenosis 08/27/2017    -> Became severe as of October 2020-referred for TAVR on 04/28/2019  . URINARY INCONTINENCE 08/26/2007   Qualifier: Diagnosis of  By: TGlori BickersMD, MCarmell Austria  . Wears hearing aid in both ears    Past Surgical History:  Procedure Laterality Date  . ANKLE SURGERY Right 1956  . CARPAL TUNNEL RELEASE Bilateral 1986, 1993  . CATARACT EXTRACTION Right 11/16/2019  . COLONOSCOPY N/A 06/24/2015   Procedure: COLONOSCOPY;  Surgeon: CGatha Mayer MD;  Location: [facility];  Service: Endoscopy;  Laterality: N/A;  . DILATATION & CURETTAGE/HYSTEROSCOPY WITH MYOSURE N/A 08/09/2017   Procedure: DILATATION & CURETTAGE/HYSTEROSCOPY  WITH MYOSURE;  Surgeon: Christophe Louis, MD;  Location: [facility];  Service: Gynecology;  Laterality: N/A;  Polypectomy  . DILATION AND CURETTAGE OF UTERUS     PMB  . HAND SURGERY Bilateral 1999   Thumbs   . KNEE SURGERY Bilateral 1998   x2  . LYMPH NODE BIOPSY N/A 10/08/2017   Procedure: LYMPH NODE BIOPSY;  Surgeon: [name], MD;  Location: [facility];  Service: Gynecology;  Laterality: N/A;  . MULTIPLE TOOTH EXTRACTIONS    . NASAL SINUS SURGERY  1976  . polio surgery.     right foot - right leg 1.5" shorter than left leg - some wekness  . RIGHT/LEFT HEART CATH AND CORONARY ANGIOGRAPHY N/A 04/09/2019   Procedure: RIGHT/LEFT HEART CATH AND CORONARY ANGIOGRAPHY;  Surgeon: Leonie Man, MD;  Location: [place] [facility];  Service: Cardiovascular;; Severe AS, mean gradient 50 mmHg P-P 55 mmHg (estimated AVA 0.76 cm).  Mildly elevated pulmonary pressures with elevated LVEDP and PCWP.  Angiographically normal coronary arteries, but tortuous.  . ROBOTIC ASSISTED TOTAL HYSTERECTOMY WITH BILATERAL SALPINGO OOPHERECTOMY Bilateral 10/08/2017   Procedure: XI ROBOTIC ASSISTED TOTAL HYSTERECTOMY WITH BILATERAL SALPINGO OOPHORECTOMY;   Surgeon: [name], MD;  Location: [facility];  Service: Gynecology;  Laterality: Bilateral;  . TEE WITHOUT CARDIOVERSION N/A 04/28/2019   Procedure: TRANSESOPHAGEAL ECHOCARDIOGRAM (TEE);  Surgeon: Burnell Blanks, MD;  Location: [place] [facility];  Service: Open Heart Surgery;  Laterality: N/A;  . THUMB ARTHROSCOPY  1999  . TONSILLECTOMY  2644   75 years old  . TRANSCATHETER AORTIC VALVE REPLACEMENT, TRANSFEMORAL N/A 04/28/2019   Procedure: TRANSCATHETER AORTIC VALVE REPLACEMENT, TRANSFEMORAL;  Surgeon: Burnell Blanks, MD;  Location: [place] [facility]; ; 23 mm Sapien 3 Ultra THV via TF approach. (post-op mean AV gradient 13 mmHg)  . TRANSTHORACIC ECHOCARDIOGRAM  05/04/2020   EF 60 to 65%.  Normal LV function and wall motion.  Indeterminate diastolic parameters.  Normal RV size and pressure.  Moderate LA dilation.  Moderate MAC but no significant MR.  Post TAVR with 23 mm Sapien 3 valve: Mean gradient 14 mmHg.  No PVL.  Normal IVC  . TRANSTHORACIC ECHOCARDIOGRAM  02/17/2019   Mod LVH. GR 2 DD. Severe AS (mean gradient = 44 mmHg with Mod AI. mild MS.  Marland Kitchen TRANSTHORACIC ECHOCARDIOGRAM  04/29/2019; 05/2020   a)  (post TAVR): EF 60-65%.  GRII DD.  Mod LA dilation & nl RA.  Mod MAC.  No  AI (no-PVL), mean AV gradient 13 mmHg.;; b) (2nd post TAVR) EF 60 to 65%.  Severely increased thickness/LVH.  GRII DD with only mildly elevated left atrial pressure.  Normal RA size.  Severe MAC with only trace MR.  No MS.  TAVR valve prosthesis functioning well.  Trivial AI.  Mean gradient 15 mmHg with a peak 29.8 mmHg  . TUBAL LIGATION    . UPPER GI ENDOSCOPY     normal  . WISDOM TOOTH EXTRACTION     Family History  Problem Relation Age of Onset  . Kidney disease Mother   . Alzheimer's disease Mother   . Stroke Father 100  . Colon cancer Father 59       possibly colon cancer, possibly just polyps  . Kidney disease Sister   . Heart attack Sister   . Colon cancer Paternal Aunt 59  . Cancer  Paternal Uncle        unk type  . Esophageal cancer Neg Hx   . Stomach  cancer Neg Hx   . Rectal cancer Neg Hx    Social History   Socioeconomic History  . Marital status: Married    Spouse name: Not on file  . Number of children: 2  . Years of education: Not on file  . Highest education level: High school graduate  Occupational History  . 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 Smoking status: Never Smoker  . Smokeless tobacco: Never Used  Vaping Use  . Vaping Use: Never used  Substance and Sexual Activity  . Alcohol use: No    Alcohol/week: 0.0 standard drinks  . Drug use: No  . Sexual activity: Yes    Birth control/protection: Post-menopausal  Other Topics Concern  . Not on file  Social History Narrative   She is relatively recently remarried.  She has 2  children and 3 grandchildren.     She is currently retired.  But enjoys cleaning house and doing chores.  She likes to do yard work.  Does not routinely exercise.   Social Determinants of Health   Financial Resource Strain: Low Risk   . Difficulty of Paying Living Expenses: Not hard at all  Food Insecurity: No Food Insecurity  . Worried About Charity fundraiser in the Last Year: Never true  . Ran Out of Food in the Last Year: Never true  Transportation Needs: No Transportation Needs  . Lack of Transportation (Medical): No  . Lack of Transportation (Non-Medical): No  Physical Activity: Insufficiently Active  . Days of Exercise per Week: 7 days  . Minutes of Exercise per Session: 20 min  Stress: No Stress Concern Present  . Feeling of Stress : Not at all  Social Connections: Not on file    Tobacco Counseling Counseling given: Not Answered   Clinical Intake:  Pre-visit preparation completed: Yes  Pain : 0-10 Pain Type: Chronic pain Pain Location: Knee Pain Descriptors / Indicators: Aching Pain Onset: More than a month ago Pain  Frequency: Intermittent     Nutritional Risks: None Diabetes: No  How often do you need to have someone help you when you read instructions, pamphlets, or other written materials from your doctor or pharmacy?: 1 - Never  Diabetic: No Nutrition Risk Assessment:  Has the patient had any N/V/D within the last 2 months?  No  Does the patient have any non-healing wounds?  No  Has the patient had any unintentional weight loss or weight gain?  No   Diabetes:  Is the patient diabetic?  No  If diabetic, was a CBG obtained today?  N/A Did the patient bring in their glucometer from home?  N/A How often do you monitor your CBG's? N/A.   Financial Strains and Diabetes Management:  Are you having any financial strains with the device, your supplies or your medication? N/A.  Does the patient want to be seen by Chronic Care Management for management of their diabetes?  N/A Would the patient like to be referred to a Nutritionist or for Diabetic Management?  N/A Interpreter Needed?: No  Information entered by :: CJohnson, LPN   Activities of Daily Living In your present state of health, do you have any difficulty performing the following activities: 12/14/2020 11/29/2020  Hearing? Y Y  Comment has tinnitis -  Vision? N N  Difficulty concentrating or making decisions? N N  Walking or climbing stairs? N N  Dressing or bathing? N N  Doing errands, shopping? N N  Preparing Food and eating ? N -  Using the Toilet? N -  In the past six months, have you accidently leaked urine? N -  Do you have problems with loss of bowel control? N -  Managing your Medications? N -  Managing your Finances? N -  Housekeeping or managing your Housekeeping? N -  Some recent data might be hidden    Patient Care Team: Tower, [name], MD as PCP - General Ellyn Hack Leonie Green, MD as PCP - Cardiology (Cardiology) Regal, Tamala Fothergill, DPM as Consulting Physician (Podiatry) Ninetta Lights, MD (Inactive) as Consulting  Physician (Orthopedic Surgery) Jerrell Belfast, MD as Consulting Physician (Otolaryngology) Lavella Hammock, DDS as Consulting Physician (Dentistry) Macarthur Critchley, OD as Referring Physician (Optometry)  Indicate any recent Medical Services you may have received from other than [facility] providers in the past year (date may be approximate).     Assessment:   This is a routine wellness examination for Bahja.  Hearing/Vision screen  Hearing Screening   _0  _1  _2  _3  _4  _5  _6  _7  _8   Right ear:           Left ear:           Vision Screening Comments: Patient gets annual eye exams  Dietary issues and exercise activities discussed: Current Exercise Habits: Home exercise routine, Type of exercise: Other - see comments (stationary bike), Time (Minutes): 20, Frequency (Times/Week): 7, Weekly Exercise (Minutes/Week): 140, Intensity: Moderate, Exercise limited by: None identified  Goals Addressed            This Visit's Progress   . Patient Stated       12/14/2020, I will continue to ride my stationary bike everyday for about 20 minutes.      Depression Screen PHQ 2/9 Scores 12/14/2020 11/29/2020 11/24/2019 11/20/2018 11/20/2017 11/07/2017 10/31/2017  PHQ - 2 Score _9 0 _10 PHQ- 9 Score _11 0 8 10 -    Fall Risk Fall Risk  12/14/2020 11/29/2020 11/24/2019 11/20/2018 01/16/2018  Falls in the past year? _12 Yes  Comment - - tripped on deck Several falls due to losing balance; soreness  -  Number falls in past yr: 0 1 0 1 2 or more  Comment - - - - -  Injury with Fall? 0 0 0 1 Yes  Risk Factor Category  - - - - -  Risk for fall due to : Medication side effect - Medication side effect;Impaired balance/gait History of fall(s);Impaired balance/gait Other (Comment)  Risk for fall due to: Comment - - - - Pt tripped   Follow up Falls evaluation completed;Falls prevention discussed - Falls evaluation completed;Falls prevention discussed - -    FALL RISK PREVENTION  PERTAINING TO THE HOME:  Any stairs in or around the home? Yes  If so, are there any without handrails? No  Home free of loose throw rugs in walkways, pet beds, electrical cords, etc? Yes  Adequate lighting in your home to reduce risk of falls? Yes   ASSISTIVE DEVICES UTILIZED TO PREVENT FALLS:  Life alert? No  Use of a cane, walker or w/c? Yes  Grab bars in the bathroom? No  Shower chair or bench in shower? No  Elevated toilet seat or a handicapped toilet? No   TIMED UP AND GO:  Was the test performed? N/A telephone visit .   Cognitive Function: MMSE - Mini Mental State Exam 12/14/2020  11/24/2019 11/20/2018 11/07/2017 11/02/2016  Orientation to time _0 Orientation to Place _1 Registration _2 Attention/ Calculation 5 5 0 0 0  Recall _3 Recall-comments - - unable to recall 1 of 3 words - -  Language- name 2 objects - - 0 0 0  Language- repeat _4 Language- follow 3 step command - - 0 3 3  Language- read & follow direction - - 0 0 0  Write a sentence - - 0 0 0  Copy design - - 0 0 0  Total score - - _5 Mini Cog  Mini-Cog screen was completed. Maximum score is 22. A value of 0 denotes this part of the MMSE was not completed or the patient failed this part of the Mini-Cog screening.       Immunizations Immunization History  Administered Date(s) Administered  . Fluad Quad(high Dose 65+) 05/06/2019, 05/04/2020  . Influenza Whole 04/08/2008, 06/30/2009  . Influenza, High Dose Seasonal PF 05/17/2015, 05/07/2016, 05/07/2017, 04/22/2018  . Influenza-Unspecified 05/09/2014  . PFIZER(Purple Top)SARS-COV-2 Vaccination 07/30/2019, 08/20/2019, 04/23/2020  . Pneumococcal Conjugate-13 10/12/2014  . Pneumococcal Polysaccharide-23 08/25/2010  . Td 10/15/2003  . Tdap 06/18/2018  . Zoster, Live 08/12/2008    TDAP status: Up to date  Flu Vaccine status: Up to date  Pneumococcal vaccine status: Up to date  Covid-19 vaccine status: 3  vaccines completed, Patient aware of second booster recommendations  Qualifies for Shingles Vaccine? Yes   Zostavax completed Yes   Shingrix Completed?: No.    Education has been provided regarding the importance of this vaccine. Patient has been advised to call insurance company to determine out of pocket expense if they have not yet received this vaccine. Advised may also receive vaccine at local pharmacy or Health Dept. Verbalized acceptance and understanding.  Screening Tests Health Maintenance  Topic Date Due  . Pneumococcal Vaccine 94-71 Years old (1 of 4 - PCV13) Never done  . Zoster Vaccines- Shingrix (1 of 2) Never done  . COVID-19 Vaccine (4 - Booster for Pfizer series) 07/24/2020  . INFLUENZA VACCINE  02/06/2021  . MAMMOGRAM  06/27/2021  . COLONOSCOPY (Pts 45-41yr Insurance coverage will need to be confirmed)  11/10/2022  . TETANUS/TDAP  06/18/2028  . DEXA SCAN  Completed  . Hepatitis C Screening  Completed  . PNA vac Low Risk Adult  Completed  . HPV VACCINES  Aged Out    Health Maintenance  Health Maintenance Due  Topic Date Due  . Pneumococcal Vaccine 04649Years old (1 of 4 - PCV13) Never done  . Zoster Vaccines- Shingrix (1 of 2) Never done  . COVID-19 Vaccine (4 - Booster for Pfizer series) 07/24/2020    Colorectal cancer screening: Type of screening: Colonoscopy. Completed 11/10/2019. Repeat every 3 years  Mammogram status: Completed 06/27/2020. Repeat every year  Bone Density status: Ordered 11/29/2020. Pt provided with contact info and advised to call to schedule appt.  Lung Cancer Screening: (Low Dose CT Chest recommended if Age 76-80years, 30 pack-year currently smoking OR have quit w/in 15years.) does not qualify.   Additional Screening:  Hepatitis C Screening: does qualify; Completed 11/02/2016  Vision Screening: Recommended annual ophthalmology exams for early detection of glaucoma and other disorders of the eye. Is the patient up to date with their  annual eye exam?  Yes  Who is the provider or  what is the name of the office in which the patient attends annual eye exams? Dr. Luretha Rued If pt is not established with a provider, would they like to be referred to a provider to establish care? No .   Dental Screening: Recommended annual dental exams for proper oral hygiene  Community Resource Referral / Chronic Care Management: CRR required this visit?  No   CCM required this visit?  No      Plan:     I have personally reviewed and noted the following in the patient's chart:   . Medical and social history . Use of alcohol, tobacco or illicit drugs  . Current medications and supplements including opioid prescriptions.  . Functional ability and status . Nutritional status . Physical activity . Advanced directives . List of other physicians . Hospitalizations, surgeries, and ER visits in previous 12 months . Vitals . Screenings to include cognitive, depression, and falls . Referrals and appointments  In addition, I have reviewed and discussed with patient certain preventive protocols, quality metrics, and best practice recommendations. A written personalized care plan for preventive services as well as general preventive health recommendations were provided to patient.   Due to this being a telephonic visit, the after visit summary with patients personalized plan was offered to patient via office or my-chart. Patient preferred to pick up at office at next visit or via mychart.   Andrez Grime, LPN   10/13/[id number]

## 2020-12-15 ENCOUNTER — Inpatient Hospital Stay: Payer: PPO | Attending: Gynecologic Oncology | Admitting: Gynecologic Oncology

## 2020-12-15 VITALS — BP 170/59 | HR 76 | Temp 97.5°F | Resp 18 | Ht 65.0 in | Wt 184.4 lb

## 2020-12-15 DIAGNOSIS — F32A Depression, unspecified: Secondary | ICD-10-CM | POA: Diagnosis not present

## 2020-12-15 DIAGNOSIS — Z7982 Long term (current) use of aspirin: Secondary | ICD-10-CM | POA: Insufficient documentation

## 2020-12-15 DIAGNOSIS — I099 Rheumatic heart disease, unspecified: Secondary | ICD-10-CM | POA: Insufficient documentation

## 2020-12-15 DIAGNOSIS — I10 Essential (primary) hypertension: Secondary | ICD-10-CM | POA: Insufficient documentation

## 2020-12-15 DIAGNOSIS — M1990 Unspecified osteoarthritis, unspecified site: Secondary | ICD-10-CM | POA: Diagnosis not present

## 2020-12-15 DIAGNOSIS — I350 Nonrheumatic aortic (valve) stenosis: Secondary | ICD-10-CM | POA: Insufficient documentation

## 2020-12-15 DIAGNOSIS — Z791 Long term (current) use of non-steroidal anti-inflammatories (NSAID): Secondary | ICD-10-CM | POA: Diagnosis not present

## 2020-12-15 DIAGNOSIS — Z923 Personal history of irradiation: Secondary | ICD-10-CM | POA: Diagnosis not present

## 2020-12-15 DIAGNOSIS — Z8542 Personal history of malignant neoplasm of other parts of uterus: Secondary | ICD-10-CM | POA: Insufficient documentation

## 2020-12-15 DIAGNOSIS — Z90710 Acquired absence of both cervix and uterus: Secondary | ICD-10-CM | POA: Insufficient documentation

## 2020-12-15 DIAGNOSIS — Z90722 Acquired absence of ovaries, bilateral: Secondary | ICD-10-CM | POA: Diagnosis not present

## 2020-12-15 DIAGNOSIS — C541 Malignant neoplasm of endometrium: Secondary | ICD-10-CM

## 2020-12-15 DIAGNOSIS — Z79899 Other long term (current) drug therapy: Secondary | ICD-10-CM | POA: Diagnosis not present

## 2020-12-15 DIAGNOSIS — Z08 Encounter for follow-up examination after completed treatment for malignant neoplasm: Secondary | ICD-10-CM | POA: Diagnosis not present

## 2020-12-15 DIAGNOSIS — K219 Gastro-esophageal reflux disease without esophagitis: Secondary | ICD-10-CM | POA: Insufficient documentation

## 2020-12-15 DIAGNOSIS — M797 Fibromyalgia: Secondary | ICD-10-CM | POA: Diagnosis not present

## 2020-12-15 DIAGNOSIS — F419 Anxiety disorder, unspecified: Secondary | ICD-10-CM | POA: Diagnosis not present

## 2020-12-15 DIAGNOSIS — E785 Hyperlipidemia, unspecified: Secondary | ICD-10-CM | POA: Diagnosis not present

## 2020-12-15 NOTE — Progress Notes (Signed)
Follow-up Note: Gyn-Onc  Consult was requested by Dr. Landry Mellow for the evaluation of Veronica Greene 75 y.o. female  CC:  Chief Complaint  Patient presents with   Endometrial cancer [facility])    Assessment/Plan:  Veronica Greene  is a 75 y.o.  year old with stage IB grade 2 endometrioid endometrial adenocarcinoma (MSI high/MMR normal).  High/intermediate risk factors for recurrence. s/p vaginal brachytherapy completed 12/19/17. No evidence of disease on exam.   I recommend she continue follow-up at 6 monthly intervals for symptom review, physical examination and pelvic examination. Pap smear is not recommended in routine endometrial cancer surveillance. She will see Dr Sondra Come in 6 months and myself in 12 months.  HPI: Veronica Greene is a 74 year old parous woman who was seen in consultation at the request of Dr Landry Mellow for grade 1 endometrial cancer.  She first began vaginal spotting in July 2017.  She was seen for an annual visit with Dr. Landry Mellow in October 2018 and reported this symptom.  A transvaginal ultrasound scan was performed on June 25, 2017 and revealed a normal-sized uterus measuring 7 x 4 x 2.7 cm with normal ovaries bilaterally.  The endometrial thickness was 1.28 cm.  On saline infusion the endometrial hyperechoic mass measured 7 mm. The tumor was MSI high, MMR normal.  She had MLH1 promotor methylation in her tumor but is negative for Lynch syndrome on genetic testing.   She was noted to have a systolic cardiac murmur and was seen by cardiology who performed a transthoracic echo on July 30, 2017.  This revealed moderate aortic stenosis.  The left ventricle was grossly normal in cavity size, though with some increased ventricular wall thickness.  Ejection fraction was 60-65%.  She was cleared for surgical procedure.  Of note she was asymptomatic from this aortic stenosis.  She has a remote history of rheumatic heart disease as a child.  On August 09, 2017 she underwent  a hysteroscopy D&C.  Final pathology confirmed a FIGO grade 1 endometrioid endometrial adenocarcinoma.  On 10/08/17 she underwent robotic assisted total hysterectomy, BSO and SLN biopsy. Final pathology revealed a 3.2cm grade 2 endometrioid endometrial adenocarcinoma with outer half myo invasion (1 of 1.2cm) and no LVSI. All SLN's were negative as were adnexa and cervix. MSI pending. She was determined to have high/intermediate risk factors and was recommended to have vaginal brachytherapy in accordance with NCCN guidelines.  She completed vaginal brachytherapy with 30Gy in 5 fractions on 12/19/17.  She has persistent right lower quadrant intermittent pains which were evaluated with a CT scan in June, 2020 which was negative for recurrence.  She had an aortic valve replaced in 2020 and is doing very well after this.   CT scan in December, 2021 for RLE edema was negative for metastatic disease or DVT.   Interval Hx:   She has no symptoms concerning for recurrence.   Current Meds:  Outpatient Encounter Medications as of 12/15/2020  Medication Sig   acetaminophen (TYLENOL) 325 MG tablet Take 650 mg by mouth every 6 (six) hours as needed (for pain.).   acetic acid-hydrocortisone (VOSOL-HC) OTIC solution Place 2 drops into both ears 2 (two) times daily as needed (itching).   amLODipine (NORVASC) 5 MG tablet Take 1 tablet (5 mg total) by mouth daily.   Ascorbic Acid (VITAMIN C) 1000 MG tablet Take 1,000 mg by mouth daily with breakfast.   aspirin EC 81 MG tablet Take 81 mg by mouth daily.   Black  Pepper-Turmeric (TURMERIC COMPLEX/BLACK PEPPER PO) Take 15 mLs by mouth daily with breakfast.    Calcium-Phosphorus-Vitamin D (CALCIUM/VITAMIN D3/ADULT GUMMY PO) Take 1 tablet by mouth 2 (two) times daily.    cetirizine (ZYRTEC) 10 MG tablet Take 5 mg by mouth as needed for allergies.   cholecalciferol (VITAMIN D) 1000 UNITS tablet Take 1,000 Units by mouth daily with breakfast.    D 1000 25 MCG (1000 UT)  capsule 1 Add'l Sig oral Select Frequency   fluticasone (FLONASE) 50 MCG/ACT nasal spray Place 2 sprays into both nostrils at bedtime.    furosemide (LASIX) 20 MG tablet May take 20 mg as needed up to three times a week  Do not take HCTZ that day   GUAIFENESIN 1200 PO Take 1,200 mg by mouth at bedtime.    hydrochlorothiazide (HYDRODIURIL) 25 MG tablet Take 1 tablet (25 mg total) by mouth daily with lunch.   lubiprostone (AMITIZA) 24 MCG capsule Take 1 capsule (24 mcg total) by mouth 2 (two) times daily. LUNCH & SUPPER   meloxicam (MOBIC) 7.5 MG tablet TAKE 1 TABLET BY MOUTH AT BEDTIME.   metoprolol succinate (TOPROL-XL) 25 MG 24 hr tablet TAKE 1 TABLET BY MOUTH DAILY.   Multiple Vitamin (MULTIVITAMIN WITH MINERALS) TABS tablet Take 1 tablet by mouth daily.    NEXIUM 40 MG capsule 1 Add'l Sig oral Select Frequency   Polyethyl Glycol-Propyl Glycol 0.4-0.3 % SOLN Place 1-2 drops into both eyes 2 (two) times daily.    potassium chloride (KLOR-CON) 10 MEQ tablet Take 2 tablets (20 mEq total) by mouth 2 (two) times daily.   Simethicone (PHAZYME PO) Take 1 tablet by mouth at bedtime as needed (for gas).    sodium chloride (OCEAN) 0.65 % nasal spray Place 1 spray into the nose daily.   triamcinolone (KENALOG) 0.1 % Apply topically.   venlafaxine XR (EFFEXOR XR) 75 MG 24 hr capsule Take 1 capsule (75 mg total) by mouth daily after breakfast.   Facility-Administered Encounter Medications as of 12/15/2020  Medication   0.9 %  sodium chloride infusion    Allergy:  Allergies  Allergen Reactions   Clarithromycin Swelling and Other (See Comments)    Throat Swells   Penicillins Hives and Other (See Comments)    WELTS Has patient had a PCN reaction causing immediate rash, facial/tongue/throat swelling, SOB or lightheadedness with hypotension: No Has patient had a PCN reaction causing severe rash involving mucus membranes or skin necrosis: Unknown Has patient had a PCN reaction that required  hospitalization:No Has patient had a PCN reaction occurring within the last 10 years: No If all of the above answers are "NO", then may proceed with Cephalosporin use.    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  She has 2  children and 3 grandchildren.     She is currently retired.  But enjoys cleaning house and doing chores.  She likes to do yard work.  Does not routinely exercise.   Social Determinants of Health   Financial Resource Strain: Low Risk    Difficulty of Paying Living Expenses: Not hard at all  Food Insecurity: No Food Insecurity   Worried About Charity fundraiser in the Last Year: Never true   [place] in the Last Year: Never true  Transportation Needs: No Transportation Needs   Lack of Transportation (Medical): No   Lack of Transportation (Non-Medical): No  Physical Activity: Insufficiently Active   Days of Exercise per Week: 7 days   Minutes of Exercise per Session: 20 min  Stress: No Stress Concern Present   Feeling of Stress : Not at all  Social Connections: Not on file  Intimate Partner Violence: Not At Risk   Fear of Current or Ex-Partner: No   Emotionally Abused: No   Physically Abused: No   Sexually Abused: No    Past Surgical Hx:  Past Surgical  History:  Procedure Laterality Date   ANKLE SURGERY Right 1956   CARPAL TUNNEL RELEASE Bilateral 1986, 1993   CATARACT EXTRACTION Right 11/16/2019   COLONOSCOPY N/A 06/24/2015   Procedure: COLONOSCOPY;  Surgeon: Gatha Mayer, MD;  Location: [facility];  Service: Endoscopy;  Laterality: N/A;   DILATATION & CURETTAGE/HYSTEROSCOPY WITH MYOSURE N/A 08/09/2017   Procedure: DILATATION & CURETTAGE/HYSTEROSCOPY WITH MYOSURE;  Surgeon: Christophe Louis, MD;  Location: [place] [facility] [facility];  Service: Gynecology;  Laterality: N/A;  Polypectomy   DILATION AND CURETTAGE OF UTERUS     PMB   HAND SURGERY Bilateral 1999   Thumbs    KNEE SURGERY Bilateral 1998   x2   LYMPH NODE BIOPSY N/A 10/08/2017   Procedure: LYMPH NODE BIOPSY;  Surgeon: [name], MD;  Location: [facility];  Service: Gynecology;  Laterality: N/A;   MULTIPLE TOOTH EXTRACTIONS     NASAL SINUS SURGERY  1976   polio surgery.     right foot - right leg 1.5" shorter than left leg - some wekness   RIGHT/LEFT HEART CATH AND CORONARY ANGIOGRAPHY N/A 04/09/2019   Procedure: RIGHT/LEFT HEART CATH AND CORONARY ANGIOGRAPHY;  Surgeon: Leonie Man, MD;  Location: [place] [facility];  Service: Cardiovascular;; Severe AS, mean gradient 50 mmHg P-P 55 mmHg (estimated AVA 0.76 cm).  Mildly elevated pulmonary pressures with elevated LVEDP and PCWP.  Angiographically normal coronary arteries, but tortuous.   ROBOTIC ASSISTED TOTAL HYSTERECTOMY WITH BILATERAL SALPINGO OOPHERECTOMY Bilateral 10/08/2017   Procedure: XI ROBOTIC ASSISTED TOTAL HYSTERECTOMY WITH BILATERAL SALPINGO OOPHORECTOMY;  Surgeon: [name], MD;  Location: [facility];  Service: Gynecology;  Laterality: Bilateral;   TEE WITHOUT CARDIOVERSION N/A 04/28/2019   Procedure: TRANSESOPHAGEAL ECHOCARDIOGRAM (TEE);  Surgeon: Burnell Blanks, MD;  Location: [place] [facility];  Service: Open Heart Surgery;  Laterality: N/A;   THUMB ARTHROSCOPY  1999   TONSILLECTOMY  605   75 years old   TRANSCATHETER AORTIC  VALVE REPLACEMENT, TRANSFEMORAL N/A 04/28/2019   Procedure: TRANSCATHETER AORTIC VALVE REPLACEMENT, TRANSFEMORAL;  Surgeon: Burnell Blanks, MD;  Location: [place] [facility]; ; 23 mm Sapien 3 Ultra THV via TF approach. (post-op mean AV gradient 13 mmHg)   TRANSTHORACIC ECHOCARDIOGRAM  05/04/2020   EF 60 to 65%.  Normal LV function  and wall motion.  Indeterminate diastolic parameters.  Normal RV size and pressure.  Moderate LA dilation.  Moderate MAC but no significant MR.  Post TAVR with 23 mm Sapien 3 valve: Mean gradient 14 mmHg.  No PVL.  Normal IVC   TRANSTHORACIC ECHOCARDIOGRAM  02/17/2019   Mod LVH. GR 2 DD. Severe AS (mean gradient = 44 mmHg with Mod AI. mild Veronica.   TRANSTHORACIC ECHOCARDIOGRAM  04/29/2019; 05/2020   a)  (post TAVR): EF 60-65%.  GRII DD.  Mod LA dilation & nl RA.  Mod MAC.  No  AI (no-PVL), mean AV gradient 13 mmHg.;; b) (2nd post TAVR) EF 60 to 65%.  Severely increased thickness/LVH.  GRII DD with only mildly elevated left atrial pressure.  Normal RA size.  Severe MAC with only trace MR.  No Veronica.  TAVR valve prosthesis functioning well.  Trivial AI.  Mean gradient 15 mmHg with a peak 29.8 mmHg   TUBAL LIGATION     UPPER GI ENDOSCOPY     normal   WISDOM TOOTH EXTRACTION      Past Medical Hx:  Past Medical History:  Diagnosis Date   Anxiety and depression 08/26/2007   Qualifier: Diagnosis of  By: Glori Bickers MD, Carmell Austria    Arthritis    knees   Constipation, slow transit 11/03/2010   Degenerative disk disease 08/31/2011   Depression with anxiety    DERMATITIS, SEBORRHEIC 08/25/2010   Qualifier: Diagnosis of  By: Glori Bickers MD, Carmell Austria    Endometrial cancer [facility])    02/ 2019  Diagnosed with D&C/hysteroscopy with polypectomy   Essential hypertension 08/29/2017   Fibromyalgia    tx mobic   Full dentures    GERD 08/26/2007   Qualifier: Diagnosis of  By: Glori Bickers MD, Carmell Austria    Gout    Hearing loss    wears bilateral hearing aids   History of colon polyps    History of  nonmelanoma skin cancer    right lower leg   History of poliomyelitis 08/26/2007   Qualifier: History of  By: Glori Bickers MD, Carmell Austria    Hyperlipidemia, mild 09/02/2012   Hypertension    Hypokalemia 01/02/2011   IBS (irritable bowel syndrome)    with constipation   Obesity 08/26/2007   Qualifier: Diagnosis of  By: Glori Bickers MD, Carmell Austria    OSTEOARTHRITIS, HANDS, BILATERAL 08/26/2007   Qualifier: Diagnosis of  By: Glori Bickers MD, Carmell Austria    Osteopenia    OVERACTIVE BLADDER 08/26/2007   Qualifier: Diagnosis of  By: Glori Bickers MD, Carmell Austria    Personal history of colonic polyps 11/03/2010   05/2018 13 polyps mostly adenomas, ssp's - recall 1 year 2020 Gatha Mayer, MD, [facility]    Poor balance 11/25/2018   With falls  Has rise in R shoe/post polio  Weak legs as well     S/P TAVR (transcatheter aortic valve replacement)    s/p TAVR with a 96m Edwards S3U via the TF approach by Dr. BCyndia Bentand Dr. MAngelena Form  Severe calcific aortic stenosis 08/27/2017    -> Became severe as of October 2020-referred for TAVR on 04/28/2019   URINARY INCONTINENCE 08/26/2007   Qualifier: Diagnosis of  By: TGlori BickersMD, MCarmell Austria   Wears hearing aid in both ears     Past Gynecological History:  Endometrial cancer. No LMP recorded. Patient has had a hysterectomy.  Family Hx:  Family History  Problem Relation Age of Onset   Kidney disease Mother  Alzheimer's disease Mother    Stroke Father 61   Colon cancer Father 29       possibly colon cancer, possibly just polyps   Kidney disease Sister    Heart attack Sister    Colon cancer Paternal Aunt 36   Cancer Paternal Uncle        unk type   Esophageal cancer Neg Hx    Stomach cancer Neg Hx    Rectal cancer Neg Hx     Review of Systems:  Constitutional  Feels well,    ENT Normal appearing ears and nares bilaterally Skin/Breast  No rash, sores, jaundice, itching, dryness Cardiovascular  No chest pain, shortness of breath, or edema  Pulmonary  No cough or wheeze.   Gastro Intestinal  No nausea, vomitting, or diarrhoea. No bright red blood per rectum, no change in bowel movement, or constipation.  Genito Urinary  No frequency, urgency, dysuria, no postmenopausal bleeding. Musculo Skeletal  No myalgia, arthralgia, joint swelling or pain  Neurologic  No weakness, numbness, change in gait,  Psychology  No depression, anxiety, insomnia.   Vitals:  Blood pressure (!) 170/59, pulse 76, temperature (!) 97.5 F (36.4 C), temperature source Tympanic, resp. rate 18, height _0  (1.651 m), weight 184 lb 6.4 oz (83.6 kg), SpO2 100 %.  Physical Exam: WD in NAD Neck  Supple NROM, without any enlargements.  Lymph Node Survey No cervical supraclavicular or inguinal adenopathy Cardiovascular  Well perfused peripheries Lungs  No increased WOB Skin  No rash/lesions/breakdown  Psychiatry  Alert and oriented to person, place, and time  Abdomen  Normoactive bowel sounds, abdomen soft, non-tender and obese without evidence of hernia. Well healed incisions.  Back No CVA tenderness Genito Urinary  External genitalia normal. Vaginal cuff smooth, no lesions or masses visible or palpable.  Rectal  No palpable masses Extremities  Healing skin cancer excision on right shin  Thereasa Solo, MD  12/15/2020, 2:07 PM

## 2020-12-15 NOTE — Patient Instructions (Signed)
Please notify Dr Denman George at phone number [phone] if you notice vaginal bleeding, new pelvic or abdominal pains, bloating, feeling full easy, or a change in bladder or bowel function.   Please have Dr Clabe Seal office contact Dr Serita Grit office (at [phone]) in December after your appointment with him to request an appointment with Dr Denman George for June, 2023.

## 2020-12-21 ENCOUNTER — Ambulatory Visit
Admission: RE | Admit: 2020-12-21 | Discharge: 2020-12-21 | Disposition: A | Discharge status: Home / Self Care | Payer: PPO | Source: Ambulatory Visit | Attending: Family Medicine | Admitting: Family Medicine

## 2020-12-21 ENCOUNTER — Other Ambulatory Visit: Payer: Self-pay

## 2020-12-21 DIAGNOSIS — Z780 Asymptomatic menopausal state: Secondary | ICD-10-CM | POA: Diagnosis not present

## 2020-12-21 DIAGNOSIS — E2839 Other primary ovarian failure: Secondary | ICD-10-CM

## 2020-12-29 ENCOUNTER — Other Ambulatory Visit: Payer: Self-pay | Admitting: Family Medicine

## 2020-12-29 ENCOUNTER — Telehealth: Payer: Self-pay

## 2020-12-29 DIAGNOSIS — Z1231 Encounter for screening mammogram for malignant neoplasm of breast: Secondary | ICD-10-CM

## 2020-12-29 NOTE — Telephone Encounter (Signed)
Patient called wanting to get her Bone Density results from last week. I did not see a note on the results yet. Please advise. Thank you.

## 2020-12-29 NOTE — Telephone Encounter (Signed)
I see the result in epic but it never popped up in my results in basket that I can tell  Normal range for bone density- great! Continue supplements and exercise as tolerated

## 2020-12-29 NOTE — Telephone Encounter (Signed)
Per DPR left VM letting pt know Dr. Marliss Coots comment regarding her DEXA results

## 2021-01-23 ENCOUNTER — Other Ambulatory Visit: Payer: Self-pay | Admitting: Family Medicine

## 2021-01-23 DIAGNOSIS — L821 Other seborrheic keratosis: Secondary | ICD-10-CM | POA: Diagnosis not present

## 2021-01-23 DIAGNOSIS — L905 Scar conditions and fibrosis of skin: Secondary | ICD-10-CM | POA: Diagnosis not present

## 2021-01-23 DIAGNOSIS — L570 Actinic keratosis: Secondary | ICD-10-CM | POA: Diagnosis not present

## 2021-01-23 DIAGNOSIS — Z85828 Personal history of other malignant neoplasm of skin: Secondary | ICD-10-CM | POA: Diagnosis not present

## 2021-01-24 NOTE — Telephone Encounter (Signed)
Last filled on 01/27/20 #90 tabs with 3 refills, CPE scheduled on 11/30/21

## 2021-02-03 IMAGING — CT CT CTA ABD/PEL W/CM AND/OR W/O CM
1 of 5 series · 4 of 28 positions shown · IV contrast (omnipaque)
Comparison: CT the abdomen and pelvis 12/18/2018.

CLINICAL DATA: 73-year-old female with history of severe aortic
valve stenosis. Preprocedural study prior to potential transcatheter
aortic valve replacement (TAVR) procedure.

EXAM:
CT ANGIOGRAPHY CHEST, ABDOMEN AND PELVIS
TECHNIQUE: Multidetector CT imaging through the chest, abdomen and pelvis was
performed using the standard protocol during bolus administration of
intravenous contrast. Multiplanar reconstructed images and MIPs were
obtained and reviewed to evaluate the vascular anatomy.
CONTRAST:  100mL OMNIPAQUE IOHEXOL 350 MG/ML SOLN

[Series 219: (person_name) · 0.18mm/px · 4 of 16 slices shown]
[im 2/16  lung]
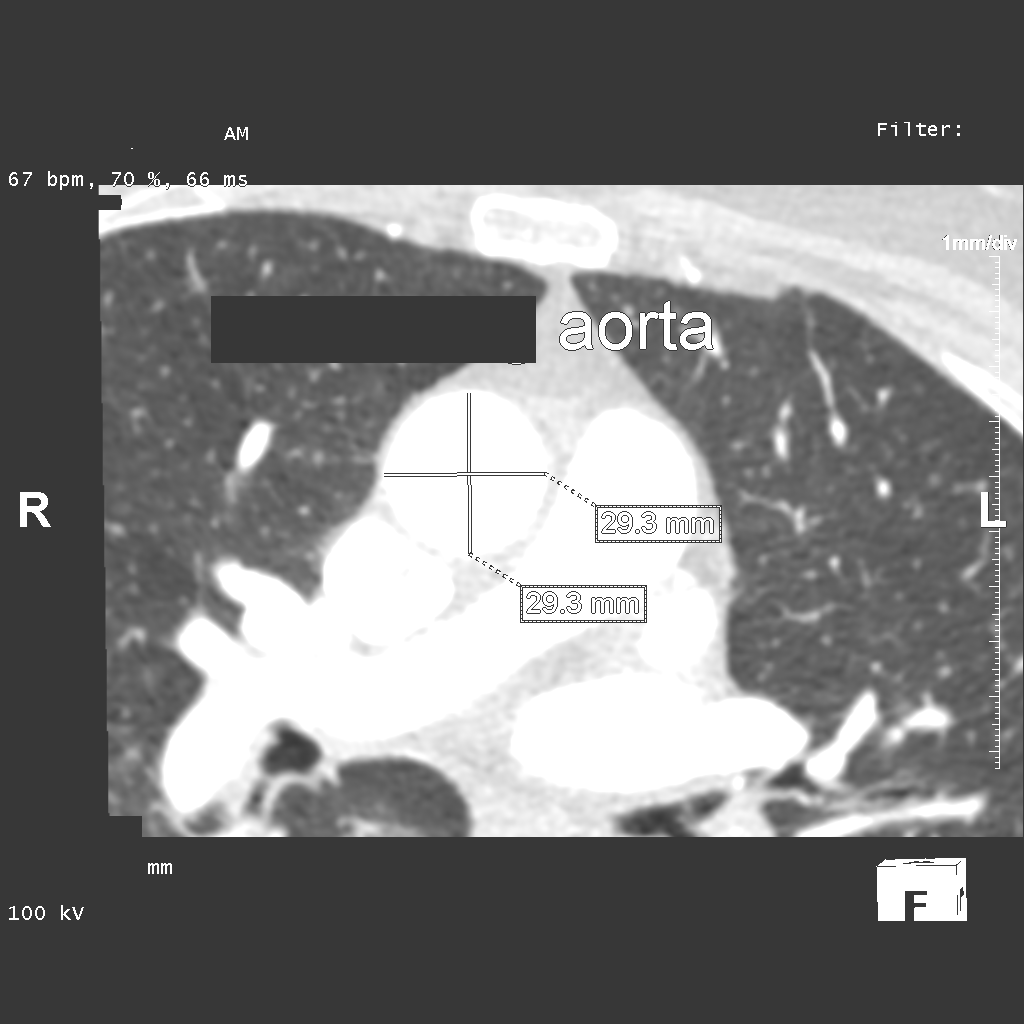
[im 6/16  mediastinal]
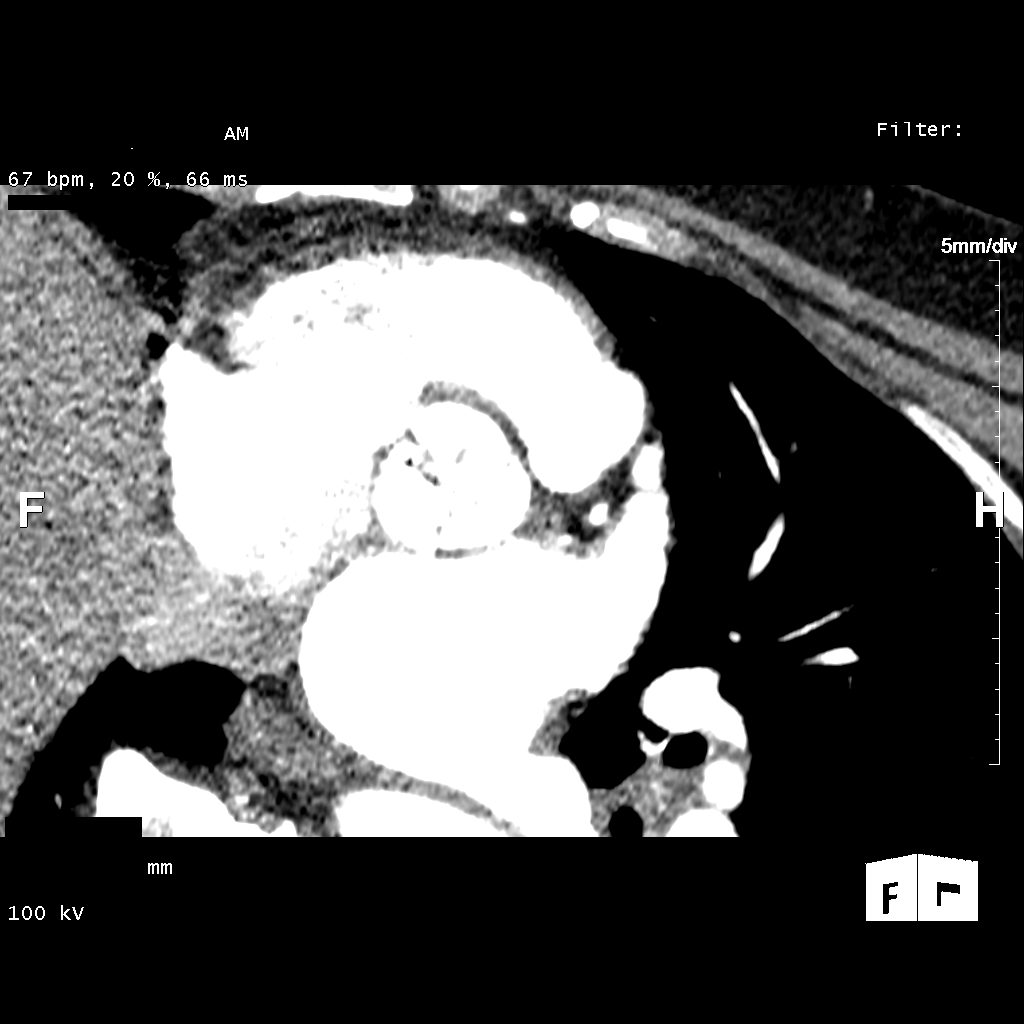
[im 10/16  lung]
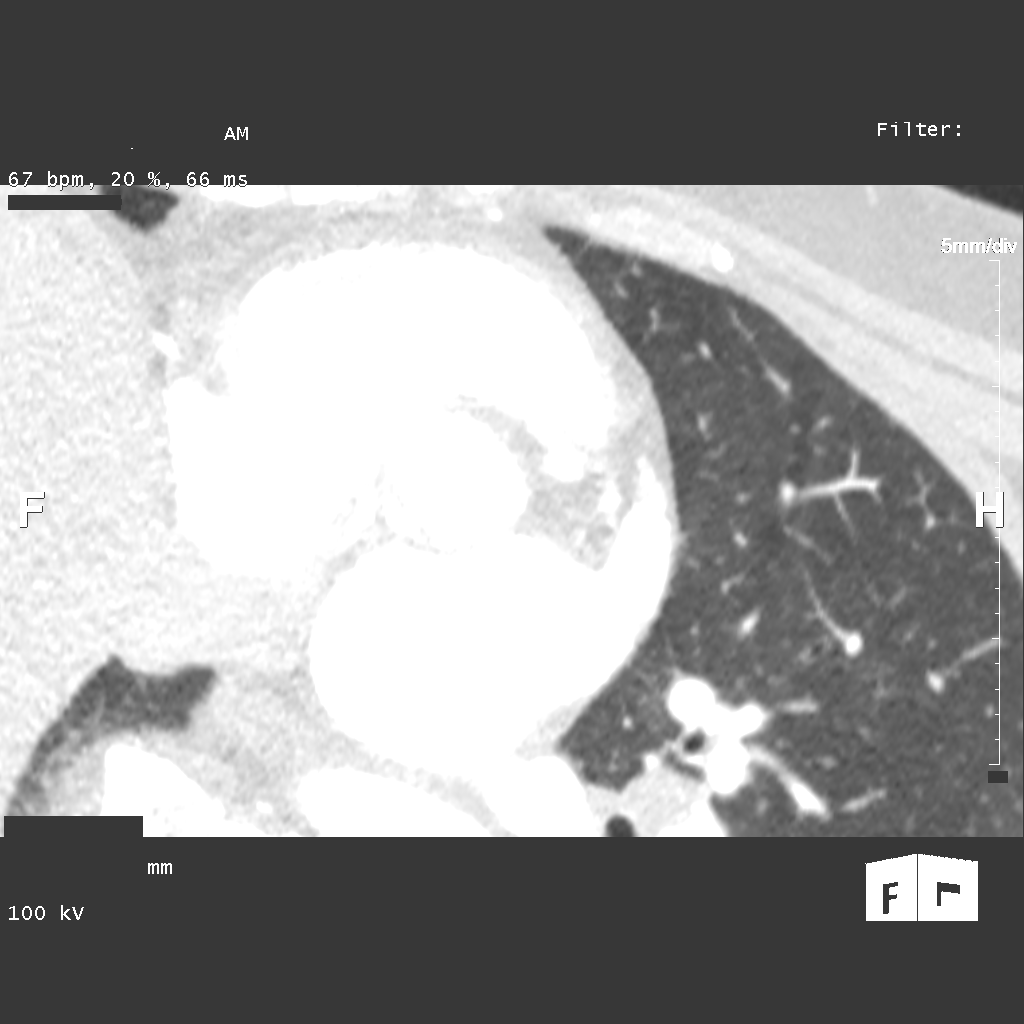
[im 14/16  mediastinal]
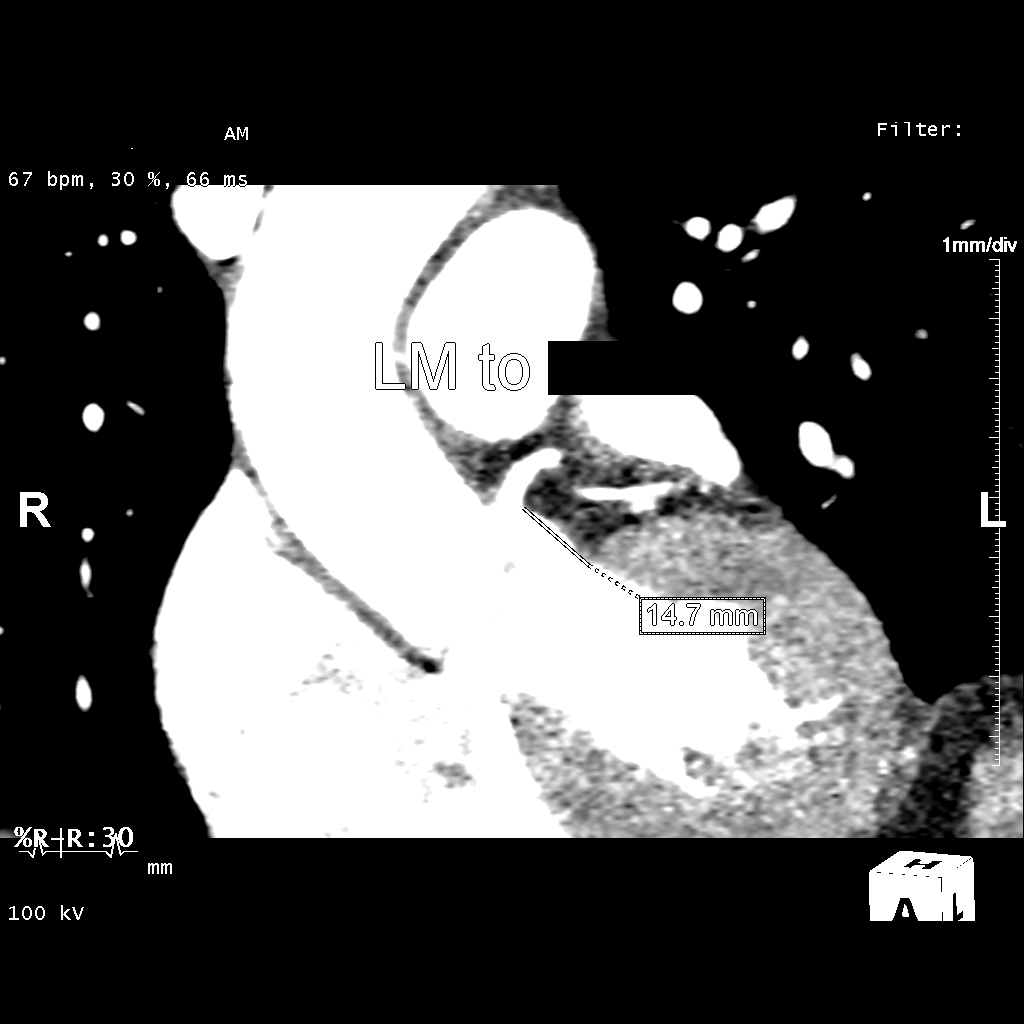

[4 of 28 positions shown; findings below may reference images not displayed]

FINDINGS: CTA CHEST FINDINGS

Cardiovascular: Heart size is normal. There is no significant
pericardial fluid, thickening or pericardial calcification. There is
aortic atherosclerosis, as well as atherosclerosis of the great
vessels of the mediastinum and the coronary arteries, including
calcified atherosclerotic plaque in the left anterior descending
coronary artery. Severe thickening calcification of the aortic
valve. Severe calcifications of the mitral annulus.

Mediastinum/Lymph Nodes: No pathologically enlarged mediastinal or
hilar lymph nodes. Esophagus is unremarkable in appearance. No
axillary lymphadenopathy.

Lungs/Pleura: No suspicious appearing pulmonary nodules or masses
are noted. No acute consolidative airspace disease. No pleural
effusions.

Musculoskeletal/Soft Tissues: There are no aggressive appearing
lytic or blastic lesions noted in the visualized portions of the
skeleton.

CTA ABDOMEN AND PELVIS FINDINGS

Hepatobiliary: No suspicious cystic or solid hepatic lesions. No
intra or extrahepatic biliary ductal dilatation. Gallbladder is
normal in appearance.

Pancreas: No pancreatic mass. No pancreatic ductal dilatation. No
pancreatic or peripancreatic fluid collections or inflammatory
changes.

Spleen: Unremarkable.

Adrenals/Urinary Tract: Bilateral kidneys and bilateral adrenal
glands are normal in appearance. No hydroureteronephrosis. Urinary
bladder is normal in appearance.

Stomach/Bowel: Normal appearance of the stomach. No pathologic
dilatation of small bowel or colon. The appendix is not confidently
identified and may be surgically absent. Regardless, there are no
inflammatory changes noted adjacent to the cecum to suggest the
presence of an acute appendicitis at this time.

Vascular/Lymphatic: Mild aortic atherosclerosis, without evidence of
aneurysm or dissection in the abdominal or pelvic vasculature.
Vascular findings and measurements pertinent to potential TAVR
procedure, as detailed below. No lymphadenopathy noted in the
abdomen or pelvis.

Reproductive: Status post hysterectomy. Ovaries are not confidently
identified may be surgically absent or atrophic.

Other: No significant volume of ascites.  No pneumoperitoneum.

Musculoskeletal: There are no aggressive appearing lytic or blastic
lesions noted in the visualized portions of the skeleton.

VASCULAR MEASUREMENTS PERTINENT TO TAVR:

AORTA:

Minimal Aortic Diameter- 12 x 12 mm

Severity of Aortic Calcification- minimal

RIGHT PELVIS:

Right Common Iliac Artery -

Minimal 7iameter-6.7 x 7.7 mm

Tortuosity-moderate

Calcification - none

Right External Iliac Artery -

Minimal Miameter-Q.8 x 5.2 mm

Tortuosity-mild

Calcification - none

Right Common Femoral Artery -

Minimal 0iameter-O.V x 5.6 mm

Tortuosity-mild

Calcification - none

LEFT PELVIS:

Left Common Iliac Artery -

Minimal 1iameter-K.0 x 8.8 mm

Tortuosity-moderate

Calcification - none

Left External Iliac Artery -

Minimal 1iameter-7.Z x 6.6 mm

Tortuosity-mild

Calcification - none

Left Common Femoral Artery -

Minimal Riameter-P.N x 7.0 mm

Tortuosity-mild

Calcification-none

Review of the MIP images confirms the above findings.
IMPRESSION: 1. Vascular findings and measurements pertinent to potential TAVR
procedure, as detailed above.
2. Severe thickening calcification of the aortic valve, compatible
with the reported clinical history of severe aortic stenosis.
3. Aortic atherosclerosis, in addition to left anterior descending
coronary artery disease. Assessment for potential risk factor
modification, dietary therapy or pharmacologic therapy may be
warranted, if clinically indicated.
4. Additional incidental findings, as above.

## 2021-02-20 ENCOUNTER — Other Ambulatory Visit: Payer: Self-pay | Admitting: Cardiology

## 2021-04-19 ENCOUNTER — Telehealth: Payer: Self-pay | Admitting: Family Medicine

## 2021-04-19 NOTE — Telephone Encounter (Signed)
Pt called in to inquire about getting a covid booster. She states she received her last booster on 04/23/20 and is wondering if Dr. Glori Bickers advises her to go ahead and get one tomorrow. She states her pharmacy is giving them out tomorrow but she was advised back in May to hold off. Please advise.

## 2021-04-19 NOTE — Telephone Encounter (Signed)
Please tell her to go ahead and get it

## 2021-04-19 NOTE — Telephone Encounter (Signed)
Pt notified of Dr. Marliss Coots comments. Pt will get vaccine tomorrow

## 2021-04-27 DIAGNOSIS — R58 Hemorrhage, not elsewhere classified: Secondary | ICD-10-CM | POA: Diagnosis not present

## 2021-04-27 DIAGNOSIS — C44729 Squamous cell carcinoma of skin of left lower limb, including hip: Secondary | ICD-10-CM | POA: Diagnosis not present

## 2021-04-27 DIAGNOSIS — L570 Actinic keratosis: Secondary | ICD-10-CM | POA: Diagnosis not present

## 2021-04-27 DIAGNOSIS — D485 Neoplasm of uncertain behavior of skin: Secondary | ICD-10-CM | POA: Diagnosis not present

## 2021-04-27 DIAGNOSIS — Z08 Encounter for follow-up examination after completed treatment for malignant neoplasm: Secondary | ICD-10-CM | POA: Diagnosis not present

## 2021-04-27 DIAGNOSIS — R208 Other disturbances of skin sensation: Secondary | ICD-10-CM | POA: Diagnosis not present

## 2021-04-27 DIAGNOSIS — L538 Other specified erythematous conditions: Secondary | ICD-10-CM | POA: Diagnosis not present

## 2021-04-27 DIAGNOSIS — C44722 Squamous cell carcinoma of skin of right lower limb, including hip: Secondary | ICD-10-CM | POA: Diagnosis not present

## 2021-04-27 DIAGNOSIS — Z86007 Personal history of in-situ neoplasm of skin: Secondary | ICD-10-CM | POA: Diagnosis not present

## 2021-05-01 ENCOUNTER — Other Ambulatory Visit: Payer: Self-pay

## 2021-05-01 ENCOUNTER — Ambulatory Visit: Payer: PPO | Admitting: Cardiology

## 2021-05-01 ENCOUNTER — Encounter: Payer: Self-pay | Admitting: Cardiology

## 2021-05-01 VITALS — BP 150/70 | HR 68 | Ht 65.0 in | Wt 184.0 lb

## 2021-05-01 DIAGNOSIS — R600 Localized edema: Secondary | ICD-10-CM | POA: Diagnosis not present

## 2021-05-01 DIAGNOSIS — E6609 Other obesity due to excess calories: Secondary | ICD-10-CM | POA: Diagnosis not present

## 2021-05-01 DIAGNOSIS — R2689 Other abnormalities of gait and mobility: Secondary | ICD-10-CM

## 2021-05-01 DIAGNOSIS — I350 Nonrheumatic aortic (valve) stenosis: Secondary | ICD-10-CM | POA: Diagnosis not present

## 2021-05-01 DIAGNOSIS — Z952 Presence of prosthetic heart valve: Secondary | ICD-10-CM | POA: Diagnosis not present

## 2021-05-01 DIAGNOSIS — I10 Essential (primary) hypertension: Secondary | ICD-10-CM

## 2021-05-01 DIAGNOSIS — Z6830 Body mass index (BMI) 30.0-30.9, adult: Secondary | ICD-10-CM

## 2021-05-01 DIAGNOSIS — E785 Hyperlipidemia, unspecified: Secondary | ICD-10-CM | POA: Diagnosis not present

## 2021-05-01 MED ORDER — AMLODIPINE BESYLATE 10 MG PO TABS: 10.0000 mg | ORAL_TABLET | Freq: Every day | ORAL | 3 refills | Status: DC

## 2021-05-01 NOTE — Patient Instructions (Addendum)
Medication Instructions:   Increaser Amlodipine to 10 mg  daily   *If you need a refill on your cardiac medications before your next appointment, please call your pharmacy*   Lab Work:  Not needed    Testing/Procedures:  Will be schedule at [facility] has requested that you have an echocardiogram. Echocardiography is a painless test that uses sound waves to create images of your heart. It provides your doctor with information about the size and shape of your heart and how well your heart's chambers and valves are working. This procedure takes approximately one hour. There are no restrictions for this procedure.    Follow-Up: At [facility], you and your health needs are our priority.  As part of our continuing mission to provide you with exceptional heart care, we have created designated Provider Care Teams.  These Care Teams include your primary Cardiologist (physician) and Advanced Practice Providers (APPs -  Physician Assistants and Nurse Practitioners) who all work together to provide you with the care you need, when you need it.  We recommend signing up for the patient portal called "MyChart".  Sign up information is provided on this After Visit Summary.  MyChart is used to connect with patients for Virtual Visits (Telemedicine).  Patients are able to view lab/test results, encounter notes, upcoming appointments, etc.  Non-urgent messages can be sent to your provider as well.   To learn more about what you can do with MyChart, go to [email or web address].    Your next appointment:     6 month(s)  monitor blood pressure  The format for your next appointment:   In Person  Provider:   You will see one of the following Advanced Practice Providers on your designated Care Team:    Coletta Memos ,NP  Then, Glenetta Hew, MD will plan to see you again in 12 month(s).

## 2021-05-01 NOTE — Progress Notes (Signed)
Primary Care Provider: Abner Greenspan, MD Cardiologist: Veronica Hew, MD Electrophysiologist: None  Clinic Note: Chief Complaint  Patient presents with   Follow-up   Cardiac Valve Problem    S/p TAVR-no symptoms.  No heart failure or angina.    ===================================  ASSESSMENT/PLAN   Problem List Items Addressed This Visit       Cardiology Problems   Hyperlipidemia with target LDL less than 100 (Chronic)    Most recent LDL was 96 back in May.  We are still trying to avoid statin.  Continue to monitor.      Relevant Medications   amLODipine (NORVASC) 10 MG tablet   Essential hypertension (Chronic)    Blood pressure is pretty high today and has been high the last couple readings.  She is on amlodipine 5 mg, HCTZ 25 mg, Toprol 25 mg and 3 days a week furosemide for edema.  Will increase amlodipine to 10 mg daily. She still has room to add ARB or ACE inhibitor.      Relevant Medications   amLODipine (NORVASC) 10 MG tablet     Other   Poor balance    Has not had as many falls recently.  Balance is a little better.  Would want to avoid overly aggressive treatment of blood pressure.  I think most of these falls are musculoskeletal related as opposed to true dizziness.      Obesity (Chronic)    The patient understands the need to lose weight with diet and exercise. We have discussed specific strategies for this.      S/P TAVR (transcatheter aortic valve replacement) - Primary (Chronic)    She is now 2 years out from her TAVR valve replacement.  Due for follow-up echocardiogram which I will order now.  She is having at most class I CHF symptoms with mild edema but no PND, orthopnea or exertional dyspnea.  Continue aspirin, beta-blocker, Calcitrel blocker and HCTZ  Discussed importance of SBE prophylaxis.      Relevant Orders   ECHOCARDIOGRAM COMPLETE (Completed)   Lower extremity edema (Chronic)    Mostly right leg greater than left.  This  has to do with lack of muscle mass in that leg versus the left leg.  Continue to recommend sleep support stocking.  She has up to 3 days a week Lasix which she takes that maintains, does not take additional doses.  Furosemide is taken in place of HCTZ on those days.      Other Visit Diagnoses     Severe aortic stenosis       Relevant Medications   amLODipine (NORVASC) 10 MG tablet   Other Relevant Orders   ECHOCARDIOGRAM COMPLETE (Completed)       ===================================  HPI:    Veronica Greene is a 75 y.o. female with a PMH notable for Severe AS-s/p TAVR October 2020 (23 mm Edwards SAPIEN 3 Ultra THV), and Stage IB Grade 2 Endometrioid Endometrial Adenocarcinoma (s/p Robotic Assisted TAH/BSO and SLN biopsy -> followed by vaginal brachytherapy) who presents today for 69-monthfollow-up.  Veronica Bessirewas last seen on October 17, 2020.  She is doing well most TAVR standpoint stable echocardiogram noted in October 2021.  NYHA class I heart failure.  Plan was echocardiogram at the end of 2022.  Was still having right leg swelling noted that the tighter support stockings were more uncomfortable.  No PND orthopnea and no exertional chest pain or dyspnea.  No further falls.  Recent  Hospitalizations: None  Reviewed  CV studies:    The following studies were reviewed today: (if available, images/films reviewed: From Epic Chart or Care Everywhere) None:   Interval History:   Veronica Greene returns for 57-monthfollow-up noting that she has had intermittent episodes of high blood pressures in physician's offices-as high as 1355systolic at her gynecologist office.  She does not really routinely check her blood pressures at home.  She does however note that she gets occasional dizziness often when she brings this in and also worse with being stressed.  She denies any chest pain or pressure with rest or exertion, does have some chronic lower left lower quadrant  discomfort that radiates down her abdomen.  She has arthritis pains in her right hand, and still has right leg swelling.  Pretty well controlled well.  She has been evaluated with by orthopedic surgery as well as for lower extremity DVT given no evidence of any abnormal findings.  CV Review of Symptoms (Summary) Cardiovascular ROS: positive for - -> chronic right greater than left swelling.  Controlled.  Occasional dizziness when position change, exertional dyspnea more because of deconditioning.  She is as active as she wants to be negative for - chest pain, irregular heartbeat, orthopnea, palpitations, paroxysmal nocturnal dyspnea, rapid heart rate, shortness of breath, or syncope/near syncope or TIA/amaurosis fugax, claudication  REVIEWED OF SYSTEMS   Review of Systems  Constitutional:  Negative for malaise/fatigue and weight loss.  HENT:  Negative for congestion and nosebleeds.   Respiratory:  Negative for cough and shortness of breath.   Cardiovascular:  Positive for leg swelling (Per HPI).  Gastrointestinal:  Positive for abdominal pain (Off-and-on LUQ/epigastric discomfort radiating down). Negative for blood in stool and melena.  Genitourinary:  Negative for hematuria.  Musculoskeletal:  Positive for joint pain and myalgias. Negative for falls.       Mostly right leg  Neurological:  Positive for dizziness (As well as poor balance.). Negative for weakness.  Psychiatric/Behavioral:  Negative for depression and memory loss. The patient is not nervous/anxious and does not have insomnia.    I have reviewed and (if needed) personally updated the patient's problem list, medications, allergies, past medical and surgical history, social and family history.   PAST MEDICAL HISTORY   Past Medical History:  Diagnosis Date   Anxiety and depression 08/26/2007   Qualifier: Diagnosis of  By: TGlori BickersMD, MCarmell Austria   Arthritis    knees   Constipation, slow transit 11/03/2010   Degenerative disk  disease 08/31/2011   Depression with anxiety    DERMATITIS, SEBORRHEIC 08/25/2010   Qualifier: Diagnosis of  By: TGlori BickersMD, MCarmell Austria   Endometrial cancer ([facility]    02/ 2019  Diagnosed with D&C/hysteroscopy with polypectomy   Essential hypertension 08/29/2017   Fibromyalgia    tx mobic   Full dentures    GERD 08/26/2007   Qualifier: Diagnosis of  By: TGlori BickersMD, MCarmell Austria   Gout    Hearing loss    wears bilateral hearing aids   History of colon polyps    History of nonmelanoma skin cancer    right lower leg   History of poliomyelitis 08/26/2007   Qualifier: History of  By: TGlori BickersMD, MCarmell Austria   Hyperlipidemia, mild 09/02/2012   Hypertension    Hypokalemia 01/02/2011   IBS (irritable bowel syndrome)    with constipation   Obesity 08/26/2007   Qualifier: Diagnosis of  By: TGlori BickersMD, MRoque Lias  Annora    OSTEOARTHRITIS, HANDS, BILATERAL 08/26/2007   Qualifier: Diagnosis of  By: Glori Bickers MD, Carmell Austria    Osteopenia    OVERACTIVE BLADDER 08/26/2007   Qualifier: Diagnosis of  By: Glori Bickers MD, Carmell Austria    Personal history of colonic polyps 11/03/2010   05/2018 13 polyps mostly adenomas, ssp's - recall 1 year 2020 Gatha Mayer, MD, [facility]    Poor balance 11/25/2018   With falls  Has rise in R shoe/post polio  Weak legs as well     S/P TAVR (transcatheter aortic valve replacement)    s/p TAVR with a 49m Edwards S3U via the TF approach by Dr. BCyndia Bentand Dr. MAngelena Form  Severe calcific aortic stenosis 08/27/2017    -> Became severe as of October 2020-referred for TAVR on 04/28/2019   URINARY INCONTINENCE 08/26/2007   Qualifier: Diagnosis of  By: TGlori BickersMD, MCarmell Austria   Wears hearing aid in both ears     PAST SURGICAL HISTORY   Past Surgical History:  Procedure Laterality Date   ANK[place] SURGERY Right [place] 11[place]   COLONOSCOPY N/A 06/24/2015   Procedure: COLONOSCOPY;  Surgeon: CGatha Mayer MD;  [facility]: [facility];  Service:  Endoscopy;  Laterality: N/A;   DILATATION & CURETTAGE/HYSTEROSCOPY WITH MYOSURE N/A 08/09/2017   Procedure: DILATATION & CURETTAGE/HYSTEROSCOPY WITH MYOSURE;  Surgeon: CChristophe Louis MD;  [facility];  Service: Gynecology;  Laterality: N/A;  Polypectomy   DILATION AND CURETTAGE OF UTERUS     PMB   HAND SURGERY Bilateral 1999   Thumbs    KNEE SURGERY Bilateral 1998   x2   LYMPH NODE BIOPSY N/A 10/08/2017   Procedure: LYMPH NODE BIOPSY;  Surgeon: REveritt Amber MD;  [facility]: [facility];  Service: Gynecology;  Laterality: N/A;   MULTIPLE TOOTH EXTRACTIONS     NASAL SINUS SURGERY  1976   polio surgery.     right foot - right leg 1.5" shorter than left leg - some wekness   RIGHT/LEFT HEART CATH AND CORONARY ANGIOGRAPHY N/A 04/09/2019   Procedure: RIGHT/LEFT HEART CATH AND CORONARY ANGIOGRAPHY;  Surgeon: HLeonie Man MD;  [facility];  Service: Cardiovascular;; Severe AS, mean gradient 50 mmHg P-P 55 mmHg (estimated AVA 0.76 cm).  Mildly elevated pulmonary pressures with elevated LVEDP and PCWP.  Angiographically normal coronary arteries, but tortuous.   ROBOTIC ASSISTED TOTAL HYSTERECTOMY WITH BILATERAL SALPINGO OOPHERECTOMY Bilateral 10/08/2017   Procedure: XI ROBOTIC ASSISTED TOTAL HYSTERECTOMY WITH BILATERAL SALPINGO OOPHORECTOMY;  Surgeon: REveritt Amber MD;  Location: [facility];  Service: Gynecology;  Laterality: Bilateral;   TEE WITHOUT CARDIOVERSION N/A 04/28/2019   Procedure: TRANSESOPHAGEAL ECHOCARDIOGRAM (TEE);  Surgeon: MBurnell Blanks MD;  [facility];  Service: Open Heart Surgery;  Laterality: N/A;   THUMB ARTHROSCOPY  1999   TONSILLECTOMY  1364  75years old   TRANSCATHETER AORTIC VALVE REPLACEMENT, TRANSFEMORAL N/A 04/28/2019   Procedure: TRANSCATHETER AORTIC VALVE REPLACEMENT, TRANSFEMORAL;  Surgeon: MBurnell Blanks MD;  [facility]; ; 23 mm Sapien 3 Ultra THV via TF approach. (post-op mean AV gradient 13 mmHg)    TRANSTHORACIC ECHOCARDIOGRAM  05/04/2020   EF 60 to 65%.  Normal LV function and wall motion.  Indeterminate diastolic parameters.  Normal RV size and pressure.  Moderate LA dilation.  Moderate MAC but no significant MR.  Post TAVR with 23 mm Sapien  3 valve: Mean gradient 14 mmHg.  No PVL.  Normal IVC   TRANSTHORACIC ECHOCARDIOGRAM  02/17/2019   Mod LVH. GR 2 DD. Severe AS (mean gradient = 44 mmHg with Mod AI. mild MS.   TRANSTHORACIC ECHOCARDIOGRAM  04/29/2019; 05/2020   a)  (post TAVR): EF 60-65%.  GRII DD.  Mod LA dilation & nl RA.  Mod MAC.  No  AI (no-PVL), mean AV gradient 13 mmHg.;; b) (2nd post TAVR) EF 60 to 65%.  Severely increased thickness/LVH.  GRII DD with only mildly elevated left atrial pressure.  Normal RA size.  Severe MAC with only trace MR.  No MS.  TAVR valve prosthesis functioning well.  Trivial AI.  Mean gradient 15 mmHg with a peak 29.8 mmHg   TUBAL LIGATION     UPPER GI ENDOSCOPY     normal   WISDOM TOOTH EXTRACTION      Immunization History  Administered Date(s) Administered   Fluad Quad(high Dose 65+) 05/06/2019, 05/04/2020   Influenza Whole 04/08/2008, 06/30/2009   Influenza, High Dose Seasonal PF 05/17/2015, 05/07/2016, 05/07/2017, 04/22/2018, 05/04/2021   Influenza-Unspecified 05/09/2014   PFIZER(Purple Top)SARS-COV-2 Vaccination 07/30/2019, 08/20/2019, 04/23/2020   Pneumococcal Conjugate-13 10/12/2014   Pneumococcal Polysaccharide-23 08/25/2010   Td 10/15/2003   Tdap 06/18/2018   Zoster, Live 08/12/2008    MEDICATIONS/ALLERGIES   Current Meds  Medication Sig   acetaminophen (TYLENOL) 325 MG tablet Take 650 mg by mouth every 6 (six) hours as needed (for pain.).   acetic acid-hydrocortisone (VOSOL-HC) OTIC solution Place 2 drops into both ears 2 (two) times daily as needed (itching).   amLODipine (NORVASC) 10 MG tablet Take 1 tablet (10 mg total) by mouth daily.   Ascorbic Acid (VITAMIN C) 1000 MG tablet Take 1,000 mg by mouth daily with breakfast.    aspirin EC 81 MG tablet Take 81 mg by mouth daily.   Black Pepper-Turmeric (TURMERIC COMPLEX/BLACK PEPPER PO) Take 15 mLs by mouth daily with breakfast.    Calcium-Phosphorus-Vitamin D (CALCIUM/VITAMIN D3/ADULT GUMMY PO) Take 1 tablet by mouth 2 (two) times daily.    cetirizine (ZYRTEC) 10 MG tablet Take 5 mg by mouth as needed for allergies.   cholecalciferol (VITAMIN D) 1000 UNITS tablet Take 1,000 Units by mouth daily with breakfast.    D 1000 25 MCG (1000 UT) capsule 1 Add'l Sig oral Select Frequency   fluticasone (FLONASE) 50 MCG/ACT nasal spray Place 2 sprays into both nostrils at bedtime.    furosemide (LASIX) 20 MG tablet May take 20 mg as needed up to three times a week  Do not take HCTZ that day   GUAIFENESIN 1200 PO Take 1,200 mg by mouth at bedtime.    hydrochlorothiazide (HYDRODIURIL) 25 MG tablet Take 1 tablet (25 mg total) by mouth daily with lunch.   lubiprostone (AMITIZA) 24 MCG capsule Take 1 capsule (24 mcg total) by mouth 2 (two) times daily. LUNCH & SUPPER   meloxicam (MOBIC) 7.5 MG tablet TAKE 1 TABLET BY MOUTH AT BEDTIME.   metoprolol succinate (TOPROL-XL) 25 MG 24 hr tablet TAKE 1 TABLET BY MOUTH DAILY.   Multiple Vitamin (MULTIVITAMIN WITH MINERALS) TABS tablet Take 1 tablet by mouth daily.    NEXIUM 40 MG capsule 1 Add'l Sig oral Select Frequency   Polyethyl Glycol-Propyl Glycol 0.4-0.3 % SOLN Place 1-2 drops into both eyes 2 (two) times daily.    potassium chloride (KLOR-CON) 10 MEQ tablet Take 2 tablets (20 mEq total) by mouth 2 (two) times daily.   Simethicone (  PHAZYME PO) Take 1 tablet by mouth at bedtime as needed (for gas).    sodium chloride (OCEAN) 0.65 % nasal spray Place 1 spray into the nose daily.   triamcinolone (KENALOG) 0.1 % Apply topically.   venlafaxine XR (EFFEXOR XR) 75 MG 24 hr capsule Take 1 capsule (75 mg total) by mouth daily after breakfast.   [DISCONTINUED] amLODipine (NORVASC) 5 MG tablet Take 1 tablet (5 mg total) by mouth daily.   Current  Facility-Administered Medications for the 05/01/21 encounter (Office Visit) with Leonie Man, MD  Medication   0.9 %  sodium chloride infusion    Allergies  Allergen Reactions   Clarithromycin Swelling and Other (See Comments)    Throat Swells   Penicillins Hives and Other (See Comments)    WELTS Has patient had a PCN reaction causing immediate rash, facial/tongue/throat swelling, SOB or lightheadedness with hypotension: No Has patient had a PCN reaction causing severe rash involving mucus membranes or skin necrosis: Unknown Has patient had a PCN reaction that required hospitalization:No Has patient had a PCN reaction occurring within the last 10 years: No If all of the above answers are "NO", then may proceed with Cephalosporin use.    Codeine Nausea And Vomiting    SOCIAL HISTORY/FAMILY HISTORY   Reviewed in Epic:  Pertinent findings:  Social History   Tobacco Use   Smoking status: Never   Smokeless tobacco: Never  Vaping Use   Vaping Use: Never used  Substance Use Topics   Alcohol use: No    Alcohol/week: 0.0 standard drinks   Drug use: No   Social History   Social History Narrative   She is relatively recently remarried.  She has 2  children and 3 grandchildren.     She is currently retired.  But enjoys cleaning house and doing chores.  She likes to do yard work.  Does not routinely exercise.   She and her husband Veronica Greene) Bond enjoy traveling and living out of their camper.  They are actually in the process of packing to head down to [facility] for 3 to 4 weeks depending on the Improvement.  When I get back, they will probably go out to the [employer] or somewhere further [place] in [place] to avoid cold weather.Alycia Patten -PE, EKG, labs   Wt Readings from Last 3 Encounters:  05/01/21 184 lb (83.5 kg)  12/15/20 184 lb 6.4 oz (83.6 kg)  11/29/20 185 lb 6.4 oz (84.1 kg)    Physical Exam: BP (!) 150/70   Pulse 68   Ht _0   (1.651 m)   Wt 184 lb (83.5 kg)   SpO2 99%   BMI 30.62 kg/m  Physical Exam Vitals reviewed.  Constitutional:      General: She is not in acute distress.    Appearance: Normal appearance. She is obese. She is not toxic-appearing.  HENT:     Head: Normocephalic and atraumatic.  Neck:     Vascular: No carotid bruit or JVD.  Cardiovascular:     Rate and Rhythm: Normal rate and regular rhythm. No extrasystoles are present.    Chest Wall: PMI is not displaced.     Pulses: Normal pulses.     Heart sounds: S1 normal and S2 normal. Murmur (1-2/6 SEM at RUSB.-Neck.) heard.    No friction rub. No gallop.  Pulmonary:     Effort: Pulmonary effort is normal. No respiratory distress.     Breath sounds: Normal breath sounds.  Chest:     Chest wall: No tenderness.  Musculoskeletal:        General: Deformity (Chronic leg atrophy with leg shortening.  Orthotic shoe) present.     Cervical back: Normal range of motion and neck supple.     Right lower leg: Edema (1-2+) present.     Left lower leg: Edema (Trivial) present.  Skin:    General: Skin is warm and dry.  Neurological:     General: No focal deficit present.     Mental Status: She is alert and oriented to person, place, and time.  Psychiatric:        Mood and Affect: Mood normal.        Behavior: Behavior normal.        Thought Content: Thought content normal.        Judgment: Judgment normal.    Adult ECG Report  N/a  Recent Labs: Reviewed Lab Results  Component Value Date   CHOL 170 11/25/2020   HDL 44.50 11/25/2020   LDLCALC 96 11/25/2020   TRIG 148.0 11/25/2020   CHOLHDL 4 11/25/2020   Lab Results  Component Value Date   CREATININE 0.67 11/25/2020   BUN 18 11/25/2020   NA 141 11/25/2020   K 4.2 11/25/2020   CL 105 11/25/2020   CO2 28 11/25/2020   CBC Latest Ref Rng & Units 11/25/2020 11/20/2019 10/15/2019  WBC 4.0 - 10.5 K/uL 4.9 4.4 5.6  Hemoglobin 12.0 - 15.0 g/dL 12.1 12.7 12.8  Hematocrit 36.0 - 46.0 % 34.5(L)  36.3 36.5  Platelets 150.0 - 400.0 K/uL 195.0 185.0 197.0    Lab Results  Component Value Date   HGBA1C 5.1 04/24/2019   Lab Results  Component Value Date   TSH 2.52 11/25/2020    ==================================================  COVID-19 Education: The signs and symptoms of COVID-19 were discussed with the patient and how to seek care for testing (follow up with PCP or arrange E-visit).    I spent a total of 23 minutes with the patient spent in direct patient consultation.  Additional time spent with chart review  / charting (studies, outside notes, etc): 12 min Total Time: 35 min  Current medicines are reviewed at length with the patient today.  (+/- concerns) N/ASedated  This visit occurred during the SARS-CoV-2 public health emergency.  Safety protocols were in place, including screening questions prior to the visit, additional usage of staff PPE, and extensive cleaning of exam room while observing appropriate contact time as indicated for disinfecting solutions.  Notice: This dictation was prepared with Dragon dictation along with smart phrase technology. Any transcriptional errors that result from this process are unintentional and may not be corrected upon review.  Patient Instructions / Medication Changes & Studies & Tests Ordered   Patient Instructions  Medication Instructions:   Increaser Amlodipine to 10 mg  daily   *If you need a refill on your cardiac medications before your next appointment, please call your pharmacy*   Lab Work:  Not needed    Testing/Procedures:  Will be schedule at [place] has requested that you have an echocardiogram. Echocardiography is a painless test that uses sound waves to create images of your heart. It provides your doctor with information about the size and shape of your heart and how well your heart's chambers and valves are working. This procedure takes approximately one hour. There  are no restrictions for this procedure.    Follow-Up: At [facility],  you and your health needs are our priority.  As part of our continuing mission to provide you with exceptional heart care, we have created designated Provider Care Teams.  These Care Teams include your primary Cardiologist (physician) and Advanced Practice Providers (APPs -  Physician Assistants and Nurse Practitioners) who all work together to provide you with the care you need, when you need it.  We recommend signing up for the patient portal called "MyChart".  Sign up information is provided on this After Visit Summary.  MyChart is used to connect with patients for Virtual Visits (Telemedicine).  Patients are able to view lab/test results, encounter notes, upcoming appointments, etc.  Non-urgent messages can be sent to your provider as well.   To learn more about what you can do with MyChart, go to [email or web address].    Your next appointment:     6 month(s)  monitor blood pressure  The format for your next appointment:   In Person  Provider:   You will see one of the following Advanced Practice Providers on your designated Care Team:    Coletta Memos ,NP  Then, Veronica Hew, MD will plan to see you again in 12 month(s).      Studies Ordered:   Orders Placed This Encounter  Procedures   ECHOCARDIOGRAM COMPLETE      Veronica Greene, M.D., M.S. Interventional Cardiologist   Pager # [phone] Phone # [phone] [address] [place], [place] [address]   Thank you for choosing Heartcare at [facility]!!

## 2021-05-15 ENCOUNTER — Ambulatory Visit (HOSPITAL_COMMUNITY): Payer: PPO | Attending: Cardiology

## 2021-05-15 ENCOUNTER — Other Ambulatory Visit: Payer: Self-pay

## 2021-05-15 DIAGNOSIS — Z952 Presence of prosthetic heart valve: Secondary | ICD-10-CM | POA: Insufficient documentation

## 2021-05-15 DIAGNOSIS — I350 Nonrheumatic aortic (valve) stenosis: Secondary | ICD-10-CM | POA: Diagnosis not present

## 2021-05-15 HISTORY — PX: TRANSTHORACIC ECHOCARDIOGRAM: SHX275

## 2021-05-15 LAB — ECHOCARDIOGRAM COMPLETE
AR max vel: 1.45 cm2
AV Area VTI: 1.57 cm2
AV Area mean vel: 1.32 cm2
AV Mean grad: 18 mmHg
AV Peak grad: 32 mmHg
Ao pk vel: 2.83 m/s
Area-P 1/2: 2.11 cm2
S' Lateral: 2.3 cm

## 2021-05-16 ENCOUNTER — Telehealth: Payer: Self-pay | Admitting: Cardiology

## 2021-05-16 NOTE — Telephone Encounter (Signed)
Pt called wanting to make Dr. Ellyn Hack aware she had a fall last Tuesday on 11/1. She report she trip over a rug but denies feeling lightheaded or dizzy prior. Pt state she was was bruised and hurting for a few days but things are starting to feel better. Nurse recommended to contact pcp of symptoms worsen. Pt verbalized understanding  Pt also state she is going out of town in a few days and wanted to know ECHO results prior to going to make sure everything is ok.   Will forward to MD to make aware.

## 2021-05-16 NOTE — Telephone Encounter (Signed)
I just reviewed the echo.  Echo results are in results section.  Everything looked great there.  No abnormalities.  It sounds like she just lost her balance, and did not actually pass out.  If that is the case, probably not a cardiac issue.  If she truly did pass out, then this little more concerning.  Glenetta Hew, MD

## 2021-05-16 NOTE — Telephone Encounter (Signed)
   Pt c/o Syncope: STAT if syncope occurred within 30 minutes and pt complains of lightheadedness High Priority if episode of passing out, completely, today or in last 24 hours   Did you pass out today? No   When is the last time you passed out? Last Tuesday 11/01  Has this occurred multiple times? No   Did you have any symptoms prior to passing out? No  Pt said she wanted to let Dr. Ellyn Hack know her fall incident last week and also following up her echo result

## 2021-05-18 NOTE — Telephone Encounter (Signed)
Spoke to patient - result given  05/18/21

## 2021-05-18 NOTE — Telephone Encounter (Signed)
Left message to call back  

## 2021-05-28 ENCOUNTER — Encounter: Payer: Self-pay | Admitting: Cardiology

## 2021-05-28 NOTE — Assessment & Plan Note (Signed)
The patient understands the need to lose weight with diet and exercise. We have discussed specific strategies for this.  

## 2021-05-28 NOTE — Assessment & Plan Note (Signed)
She is now 2 years out from her TAVR valve replacement.  Due for follow-up echocardiogram which I will order now.  She is having at most class I CHF symptoms with mild edema but no PND, orthopnea or exertional dyspnea.  Continue aspirin, beta-blocker, Calcitrel blocker and HCTZ  Discussed importance of SBE prophylaxis.

## 2021-05-28 NOTE — Assessment & Plan Note (Signed)
Mostly right leg greater than left.  This has to do with lack of muscle mass in that leg versus the left leg.  Continue to recommend sleep support stocking.  She has up to 3 days a week Lasix which she takes that maintains, does not take additional doses.  Furosemide is taken in place of HCTZ on those days.

## 2021-05-28 NOTE — Assessment & Plan Note (Signed)
Most recent LDL was 96 back in May.  We are still trying to avoid statin.  Continue to monitor.

## 2021-05-28 NOTE — Assessment & Plan Note (Signed)
Blood pressure is pretty high today and has been high the last couple readings.  She is on amlodipine 5 mg, HCTZ 25 mg, Toprol 25 mg and 3 days a week furosemide for edema.  Will increase amlodipine to 10 mg daily. She still has room to add ARB or ACE inhibitor.

## 2021-05-28 NOTE — Assessment & Plan Note (Signed)
Has not had as many falls recently.  Balance is a little better.  Would want to avoid overly aggressive treatment of blood pressure.  I think most of these falls are musculoskeletal related as opposed to true dizziness.

## 2021-05-30 ENCOUNTER — Telehealth: Payer: Self-pay | Admitting: *Deleted

## 2021-05-30 NOTE — Telephone Encounter (Signed)
CALLED PATIENT TO ALTER FU ON 06-29-21 DUE TO DR. KINARD BEING ON VACATION, RESCHEDULED FOR 07-13-21 @ 9 AM, PATIENT AGREED TO NEW DATE AND TIME

## 2021-06-06 DIAGNOSIS — D0472 Carcinoma in situ of skin of left lower limb, including hip: Secondary | ICD-10-CM | POA: Diagnosis not present

## 2021-06-06 DIAGNOSIS — L080 Pyoderma: Secondary | ICD-10-CM | POA: Diagnosis not present

## 2021-06-06 DIAGNOSIS — D0471 Carcinoma in situ of skin of right lower limb, including hip: Secondary | ICD-10-CM | POA: Diagnosis not present

## 2021-06-06 DIAGNOSIS — L249 Irritant contact dermatitis, unspecified cause: Secondary | ICD-10-CM | POA: Diagnosis not present

## 2021-06-14 ENCOUNTER — Telehealth: Payer: Self-pay | Admitting: Family Medicine

## 2021-06-14 NOTE — Telephone Encounter (Signed)
Patient scheduled for an appointment with Dr. Glori Bickers tomorrow 06/15/21 at 11:00 am at [facility].

## 2021-06-14 NOTE — Telephone Encounter (Signed)
PLEASE NOTE: All timestamps contained within this report are represented as [place] Standard Time. CONFIDENTIALTY NOTICE: This fax transmission is intended only for the addressee. It contains information that is legally privileged, confidential or otherwise protected from use or disclosure. If you are not the intended recipient, you are strictly prohibited from reviewing, disclosing, copying using or disseminating any of this information or taking any action in reliance on or regarding this information. If you have received this fax in error, please notify us immediately by telephone so that we can arrange for its return to [place]. Phone: [phone], Toll-Free: [phone], Fax: [phone] Page: 1 of 2 Call Id: [id number] Veronica Greene - Client TELEPHONE ADVICE RECORD AccessNurse Patient Name: Veronica Greene AN Gender: Female DOB: 1945/08/02 Age: 75 Y 14 M 20 D Return Phone Number: [phone] (Primary), [phone] (Secondary) Address: [address] [place] [address] Client [facility] Veronica Greene Greene - Client Client Site Veronica Greene Provider Veronica Greene, Veronica Greene - MD Contact Type Call Who Is Calling Patient / Member / Family / Caregiver Call Type Triage / Clinical Relationship To Patient Self Return Phone Number [phone] (Primary) Chief Complaint Dizziness Reason for Call Symptomatic / Request for [name] states she has dizziness since yesterday that has gotten worse. She says she just had her earing aides worked on on Monday. Translation No Nurse Assessment Nurse: Veronica Curt, RN, Veronica Greene Date/Time (Eastern Time): 06/14/2021 11:44:56 AM Confirm and document reason for call. If symptomatic, describe symptoms. ---Caller states she has dizziness since yesterday that has gotten worse. She says she just had her hearing aides worked on on Monday, new set yesterday. Does the  patient have any new or worsening symptoms? ---Yes Will a triage be completed? ---Yes Related visit to physician within the last 2 weeks? ---Yes Does the PT have any chronic conditions? (i.e. diabetes, asthma, this includes High risk factors for pregnancy, etc.) ---Yes Is this a behavioral health or substance abuse call? ---No Guidelines Guideline Title Affirmed Question Affirmed Notes Nurse Date/Time (Eastern Time) Dizziness - Vertigo [1] Dizziness (vertigo) present now AND [2] age > 55 (Exception: prior physician evaluation for this AND no different/worse than usual) Veronica Curt, RN, Veronica Greene 06/14/2021 11:46:04 AM Disp. Time Veronica Greene Time) Disposition Final User PLEASE NOTE: All timestamps contained within this report are represented as [place] Standard Time. CONFIDENTIALTY NOTICE: This fax transmission is intended only for the addressee. It contains information that is legally privileged, confidential or otherwise protected from use or disclosure. If you are not the intended recipient, you are strictly prohibited from reviewing, disclosing, copying using or disseminating any of this information or taking any action in reliance on or regarding this information. If you have received this fax in error, please notify us immediately by telephone so that we can arrange for its return to [place]. Phone: [phone], Toll-Free: [phone], Fax: [phone] Page: 2 of 2 Call Id: [id number] 06/14/2021 11:48:45 AM Go to ED Now (or PCP triage) Yes Veronica Curt, RN, Veronica Greene Disagree/Comply Disagree Caller Understands Yes PreDisposition Call Doctor Care Advice Given Per Guideline GO TO ED NOW (OR PCP TRIAGE): CARE ADVICE given per Dizziness - Vertigo (Adult) guideline. BRING MEDICINES: * Another adult should drive. * IF NO PCP (PRIMARY CARE PROVIDER) SECOND-LEVEL TRIAGE: You need to be seen within the next hour. Go to the [facility] at _____________ [place] as soon as you can. Comments User:  Veronica Obey, RN Date/Time Veronica Greene Time): 06/14/2021 11:54:16  AM Would like something called in for the dizziness User: Veronica Obey, RN Date/Time Veronica Greene Time): 06/14/2021 11:54:36 AM Backline with no answer Referrals GO TO FACILITY REFUSED

## 2021-06-14 NOTE — Telephone Encounter (Signed)
I will see her then Agree with ER precautions

## 2021-06-14 NOTE — Telephone Encounter (Signed)
Pt called asking if she could get something called in for dizziness per access nurse.

## 2021-06-15 ENCOUNTER — Other Ambulatory Visit: Payer: Self-pay

## 2021-06-15 ENCOUNTER — Encounter: Payer: Self-pay | Admitting: Family Medicine

## 2021-06-15 ENCOUNTER — Ambulatory Visit (INDEPENDENT_AMBULATORY_CARE_PROVIDER_SITE_OTHER): Payer: PPO | Admitting: Family Medicine

## 2021-06-15 VITALS — BP 144/68 | HR 67 | Temp 98.1°F | Ht 65.0 in | Wt 182.4 lb

## 2021-06-15 DIAGNOSIS — R42 Dizziness and giddiness: Secondary | ICD-10-CM

## 2021-06-15 MED ORDER — MECLIZINE HCL 25 MG PO TABS: 12.5000 mg | ORAL_TABLET | Freq: Three times a day (TID) | ORAL | 0 refills | Status: DC | PRN

## 2021-06-15 NOTE — Progress Notes (Signed)
Subjective:    Patient ID: Veronica Greene, female    DOB: [birth year], 75 y.o.   MRN: [id number]  This visit occurred during the SARS-CoV-2 public health emergency.  Safety protocols were in place, including screening questions prior to the visit, additional usage of staff PPE, and extensive cleaning of exam room while observing appropriate contact time as indicated for disinfecting solutions.   HPI Pt presents with c/o dizziness  Wt Readings from Last 3 Encounters:  06/15/21 182 lb 6 oz (82.7 kg)  05/01/21 184 lb (83.5 kg)  12/15/20 184 lb 6.4 oz (83.6 kg)   30.35 kg/m  Symptoms started 3 days ago  Got up with it Tuesday am  A little better today  (yesterday was bad)  Feels like she is spinning/ going around in circles   Worse to bend over /brought on with position change    She has new hearing aides -that is going ok  No ear procedures   (had wax removed earlier in the year)  Has tinnitus  R side of neck sore this am/ muscles    Occ scant ST Sniffles  Some sinus pain /headache on the right side  Nasal mucous- dark in color/possibly dried up blood  Throbs a bit to lean forward  No fever  No recent illness   Wind and air flow bother sinuses  Still takes zyrtec  Uses flonase also  Used some saline spray    She recently took doxycycline for skin infection after a procedure  Also some steroids  R lower leg was swollen and tender  She keeps swelling  Wears supp socks at home   Patient Active Problem List   Diagnosis Date Noted   Cyst, vulva 11/29/2020   Multiple falls 03/30/2020   Lower extremity edema 02/21/2020   Endometrial cancer ([place]) 09/21/2019   S/P TAVR (transcatheter aortic valve replacement)    Poor balance 11/25/2018   History of endometrial cancer 09/12/2017   Essential hypertension 08/29/2017   Severe calcific aortic stenosis 08/27/2017   Estrogen deficiency 11/05/2016   Routine general medical examination at a health care facility  10/15/2015   Hyperlipidemia with target LDL less than 100 09/02/2012   Degenerative disk disease 08/31/2011   Personal history of colonic polyps 11/03/2010   Gout 08/25/2010   DERMATITIS, SEBORRHEIC 08/25/2010   Osteopenia 09/05/2007   History of poliomyelitis 08/26/2007   Obesity 08/26/2007   Anxiety and depression 08/26/2007   ALLERGIC RHINITIS 08/26/2007   GERD 08/26/2007   IBS 08/26/2007   OVERACTIVE BLADDER 08/26/2007   OSTEOARTHRITIS, HANDS, BILATERAL 08/26/2007   Vertigo 08/26/2007   URINARY INCONTINENCE 08/26/2007   Past Medical History:  Diagnosis Date   Anxiety and depression 08/26/2007   Qualifier: Diagnosis of  By: Glori Bickers MD, Carmell Austria    Arthritis    knees   Constipation, slow transit 11/03/2010   Degenerative disk disease 08/31/2011   Depression with anxiety    DERMATITIS, SEBORRHEIC 08/25/2010   Qualifier: Diagnosis of  By: Glori Bickers MD, Carmell Austria    Endometrial cancer [facility])    02/ 2019  Diagnosed with D&C/hysteroscopy with polypectomy   Essential hypertension 08/29/2017   Fibromyalgia    tx mobic   Full dentures    GERD 08/26/2007   Qualifier: Diagnosis of  By: Glori Bickers MD, Carmell Austria    Gout    Hearing loss    wears bilateral hearing aids   History of colon polyps    History of nonmelanoma skin cancer  right lower leg   History of poliomyelitis 08/26/2007   Qualifier: History of  By: Glori Bickers MD, Carmell Austria    Hyperlipidemia, mild 09/02/2012   Hypertension    Hypokalemia 01/02/2011   IBS (irritable bowel syndrome)    with constipation   Obesity 08/26/2007   Qualifier: Diagnosis of  By: Glori Bickers MD, Carmell Austria    OSTEOARTHRITIS, HANDS, BILATERAL 08/26/2007   Qualifier: Diagnosis of  By: Glori Bickers MD, Carmell Austria    Osteopenia    OVERACTIVE BLADDER 08/26/2007   Qualifier: Diagnosis of  By: Glori Bickers MD, Carmell [place]    Personal history of colonic polyps 11/03/2010   05/2018 13 polyps mostly adenomas, ssp's - recall 1 year 2020 Gatha Mayer, MD, [facility]    Poor balance 11/25/2018    With falls  Has rise in R shoe/post polio  Weak legs as well     S/P TAVR (transcatheter aortic valve replacement)    s/p TAVR with a 78m Edwards S3U via the TF approach by Dr. BCyndia Bentand Dr. MAngelena Form  Severe calcific aortic stenosis 08/27/2017    -> Became severe as of October 2020-referred for TAVR on 04/28/2019   URINARY INCONTINENCE 08/26/2007   Qualifier: Diagnosis of  By: TGlori BickersMD, MCarmell Austria   Wears hearing aid in both ears    Past Surgical History:  Procedure Laterality Date   ANKLE SURGERY Right 1PoynorEXTRACTION Right 11/16/2019   COLONOSCOPY N/A 06/24/2015   Procedure: COLONOSCOPY;  Surgeon: CGatha Mayer MD;  Location: [facility];  Service: Endoscopy;  Laterality: N/A;   DILATATION & CURETTAGE/HYSTEROSCOPY WITH MYOSURE N/A 08/09/2017   Procedure: DILATATION & CURETTAGE/HYSTEROSCOPY WITH MYOSURE;  Surgeon: CChristophe Louis MD;  [facility];  Service: Gynecology;  Laterality: N/A;  Polypectomy   DILATION AND CURETTAGE OF UTERUS     PMB   HAND SURGERY Bilateral 1999   Thumbs    KNEE SURGERY Bilateral 1998   x2   LYMPH NODE BIOPSY N/A 10/08/2017   Procedure: LYMPH NODE BIOPSY;  Surgeon: REveritt Amber MD;  Location: [facility];  Service: Gynecology;  Laterality: N/A;   MULTIPLE TOOTH EXTRACTIONS     NASAL SINUS SURGERY  1976   polio surgery.     right foot - right leg 1.5" shorter than left leg - some wekness   RIGHT/LEFT HEART CATH AND CORONARY ANGIOGRAPHY N/A 04/09/2019   Procedure: RIGHT/LEFT HEART CATH AND CORONARY ANGIOGRAPHY;  Surgeon: HLeonie Man MD;  [facility];  Service: Cardiovascular;; Severe AS, mean gradient 50 mmHg P-P 55 mmHg (estimated AVA 0.76 cm).  Mildly elevated pulmonary pressures with elevated LVEDP and PCWP.  Angiographically normal coronary arteries, but tortuous.   ROBOTIC ASSISTED TOTAL HYSTERECTOMY WITH BILATERAL SALPINGO OOPHERECTOMY Bilateral 10/08/2017   Procedure: XI ROBOTIC  ASSISTED TOTAL HYSTERECTOMY WITH BILATERAL SALPINGO OOPHORECTOMY;  Surgeon: REveritt Amber MD;  Location: [facility];  Service: Gynecology;  Laterality: Bilateral;   TEE WITHOUT CARDIOVERSION N/A 04/28/2019   Procedure: TRANSESOPHAGEAL ECHOCARDIOGRAM (TEE);  Surgeon: MBurnell Blanks MD;  [facility];  Service: Open Heart Surgery;  Laterality: N/A;   THUMB ARTHROSCOPY  1999   TONSILLECTOMY  [id number]  75years old   TRANSCATHETER AORTIC VALVE REPLACEMENT, TRANSFEMORAL N/A 04/28/2019   Procedure: TRANSCATHETER AORTIC VALVE REPLACEMENT, TRANSFEMORAL;  Surgeon: MBurnell Blanks MD;  [facility]; ; 23 mm Sapien 3 Ultra THV via TF approach. (  post-op mean AV gradient 13 mmHg)   TRANSTHORACIC ECHOCARDIOGRAM  05/04/2020   EF 60 to 65%.  Normal LV function and wall motion.  Indeterminate diastolic parameters.  Normal RV size and pressure.  Moderate LA dilation.  Moderate MAC but no significant MR.  Post TAVR with 23 mm Sapien 3 valve: Mean gradient 14 mmHg.  No PVL.  Normal IVC   TRANSTHORACIC ECHOCARDIOGRAM  02/17/2019   Mod LVH. GR 2 DD. Severe AS (mean gradient = 44 mmHg with Mod AI. mild MS.   TRANSTHORACIC ECHOCARDIOGRAM  04/29/2019; 05/2020   a)  (post TAVR): EF 60-65%.  GRII DD.  Mod LA dilation & nl RA.  Mod MAC.  No  AI (no-PVL), mean AV gradient 13 mmHg.;; b) (2nd post TAVR) EF 60 to 65%.  Severely increased thickness/LVH.  GRII DD with only mildly elevated left atrial pressure.  Normal RA size.  Severe MAC with only trace MR.  No MS.  TAVR valve prosthesis functioning well.  Trivial AI.  Mean gradient 15 mmHg with a peak 29.8 mmHg   TUBAL LIGATION     UPPER GI ENDOSCOPY     normal   WISDOM TOOTH EXTRACTION     Social History   Tobacco Use   Smoking status: Never   Smokeless tobacco: Never  Vaping Use   Vaping Use: Never used  Substance Use Topics   Alcohol use: No    Alcohol/week: 0.0 standard drinks   Drug use: No   Family History  Problem  Relation Age of Onset   Kidney disease Mother    Alzheimer's disease Mother    Stroke Father 22   Colon cancer Father 36       possibly colon cancer, possibly just polyps   Kidney disease Sister    Heart attack Sister    Colon cancer Paternal Aunt 76   Cancer Paternal Uncle        unk type   Esophageal cancer Neg Hx    Stomach cancer Neg Hx    Rectal cancer Neg Hx    Allergies  Allergen Reactions   Clarithromycin Swelling and Other (See Comments)    Throat Swells   Penicillins Hives and Other (See Comments)    WELTS Has patient had a PCN reaction causing immediate rash, facial/tongue/throat swelling, SOB or lightheadedness with hypotension: No Has patient had a PCN reaction causing severe rash involving mucus membranes or skin necrosis: Unknown Has patient had a PCN reaction that required hospitalization:No Has patient had a PCN reaction occurring within the last 10 years: No If all of the above answers are "NO", then may proceed with Cephalosporin use.    Codeine Nausea And Vomiting   Current Outpatient Medications on File Prior to Visit  Medication Sig Dispense Refill   acetaminophen (TYLENOL) 325 MG tablet Take 650 mg by mouth every 6 (six) hours as needed (for pain.).     acetic acid-hydrocortisone (VOSOL-HC) OTIC solution Place 2 drops into both ears 2 (two) times daily as needed (itching).     amLODipine (NORVASC) 10 MG tablet Take 1 tablet (10 mg total) by mouth daily. 180 tablet 3   Ascorbic Acid (VITAMIN C) 1000 MG tablet Take 1,000 mg by mouth daily with breakfast.     aspirin EC 81 MG tablet Take 81 mg by mouth daily.     Black Pepper-Turmeric (TURMERIC COMPLEX/BLACK PEPPER PO) Take 15 mLs by mouth daily with breakfast.      Calcium-Phosphorus-Vitamin D (CALCIUM/VITAMIN D3/ADULT GUMMY PO)  Take 1 tablet by mouth 2 (two) times daily.      cetirizine (ZYRTEC) 10 MG tablet Take 5 mg by mouth as needed for allergies.     cholecalciferol (VITAMIN D) 1000 UNITS tablet Take  1,000 Units by mouth daily with breakfast.      D 1000 25 MCG (1000 UT) capsule 1 Add'l Sig oral Select Frequency     fluticasone (FLONASE) 50 MCG/ACT nasal spray Place 2 sprays into both nostrils at bedtime.      furosemide (LASIX) 20 MG tablet May take 20 mg as needed up to three times a week  Do not take HCTZ that day 30 tablet 11   GUAIFENESIN 1200 PO Take 1,200 mg by mouth at bedtime.      hydrochlorothiazide (HYDRODIURIL) 25 MG tablet Take 1 tablet (25 mg total) by mouth daily with lunch. 90 tablet 3   lubiprostone (AMITIZA) 24 MCG capsule Take 1 capsule (24 mcg total) by mouth 2 (two) times daily. LUNCH & SUPPER 180 capsule 3   meloxicam (MOBIC) 7.5 MG tablet TAKE 1 TABLET BY MOUTH AT BEDTIME. 90 tablet 3   metoprolol succinate (TOPROL-XL) 25 MG 24 hr tablet TAKE 1 TABLET BY MOUTH DAILY. 90 tablet 3   Multiple Vitamin (MULTIVITAMIN WITH MINERALS) TABS tablet Take 1 tablet by mouth daily.      NEXIUM 40 MG capsule 1 Add'l Sig oral Select Frequency     Polyethyl Glycol-Propyl Glycol 0.4-0.3 % SOLN Place 1-2 drops into both eyes 2 (two) times daily.      potassium chloride (KLOR-CON) 10 MEQ tablet Take 2 tablets (20 mEq total) by mouth 2 (two) times daily. 360 tablet 3   Simethicone (PHAZYME PO) Take 1 tablet by mouth at bedtime as needed (for gas).      sodium chloride (OCEAN) 0.65 % nasal spray Place 1 spray into the nose daily.     triamcinolone (KENALOG) 0.1 % Apply topically.     venlafaxine XR (EFFEXOR XR) 75 MG 24 hr capsule Take 1 capsule (75 mg total) by mouth daily after breakfast. 90 capsule 3   No current facility-administered medications on file prior to visit.    Review of Systems  Constitutional:  Negative for activity change, appetite change, fatigue, fever and unexpected weight change.  HENT:  Negative for congestion, ear pain, rhinorrhea, sinus pressure and sore throat.   Eyes:  Negative for pain, redness and visual disturbance.  Respiratory:  Negative for cough,  shortness of breath and wheezing.   Cardiovascular:  Positive for leg swelling. Negative for chest pain and palpitations.  Gastrointestinal:  Negative for abdominal pain, blood in stool, constipation and diarrhea.  Endocrine: Negative for polydipsia and polyuria.  Genitourinary:  Negative for dysuria, frequency and urgency.  Musculoskeletal:  Negative for arthralgias, back pain and myalgias.  Skin:  Negative for pallor and rash.  Allergic/Immunologic: Negative for environmental allergies.  Neurological:  Positive for dizziness. Negative for tremors, seizures, syncope, facial asymmetry, speech difficulty, weakness, numbness and headaches.  Hematological:  Negative for adenopathy. Does not bruise/bleed easily.  Psychiatric/Behavioral:  Negative for decreased concentration and dysphoric mood. The patient is not nervous/anxious.       Objective:   Physical Exam Constitutional:      General: She is not in acute distress.    Appearance: Normal appearance. She is obese. She is not ill-appearing or diaphoretic.  HENT:     Head: Normocephalic and atraumatic.     Comments: Mild R maxillary sinus tenderness  Right Ear: Tympanic membrane, ear canal and external ear normal. There is no impacted cerumen.     Left Ear: Tympanic membrane, ear canal and external ear normal. There is no impacted cerumen.     Nose:     Comments: Boggy nares    Mouth/Throat:     Mouth: Mucous membranes are moist.     Pharynx: Oropharynx is clear. No posterior oropharyngeal erythema.  Eyes:     General:        Right eye: No discharge.        Left eye: No discharge.     Extraocular Movements: Extraocular movements intact.     Conjunctiva/sclera: Conjunctivae normal.     Pupils: Pupils are equal, round, and reactive to light.     Comments: 2-3 beats of horizontal nystagmus bilaterally  Some dizziness with head position change   Neck:     Vascular: No carotid bruit.  Cardiovascular:     Rate and Rhythm: Normal  rate and regular rhythm.     Heart sounds: Murmur heard.  Pulmonary:     Effort: Pulmonary effort is normal. No respiratory distress.     Breath sounds: Normal breath sounds. No wheezing or rales.  Musculoskeletal:     Cervical back: Normal range of motion and neck supple. No tenderness.  Lymphadenopathy:     Cervical: No cervical adenopathy.  Skin:    General: Skin is warm and dry.     Coloration: Skin is not pale.     Findings: No bruising or erythema.  Neurological:     Mental Status: She is alert.     Cranial Nerves: No cranial nerve deficit or facial asymmetry.     Sensory: Sensation is intact. No sensory deficit.     Motor: Motor function is intact. No weakness, tremor, abnormal muscle tone or pronator drift.     Coordination: Coordination is intact. Coordination normal.     Gait: Gait is intact. Gait normal.     Deep Tendon Reflexes: Reflexes normal.  Psychiatric:        Mood and Affect: Mood normal.          Assessment & Plan:   Problem List Items Addressed This Visit       Other   Vertigo - Primary    Mild to moderate dizziness with positional change (spinning sensation) consistent with vertigo Some sinus congestion intermittently  Reassuring exam Some improvement from yesterday  Px meclizine to use prn with caution of sedation  Zyrtec and flonase for congestion  inst to use caution with positional change  ER precautions discussed  Update if not starting to improve in a week or if worsening  Given handout on condition and also epely maneuver

## 2021-06-15 NOTE — Patient Instructions (Addendum)
Go up on your flonase to twice daily for 5 days then return to once daily   I sent in meclizine to use as needed for moderate to severe dizziness Caution of sedation with this   Don't change position quickly -take your time so you do not get dizzy   If symptoms worsen let us know (if severe go to the ER)   Update if not starting to improve in a week or if worsening

## 2021-06-15 NOTE — Assessment & Plan Note (Signed)
Mild to moderate dizziness with positional change (spinning sensation) consistent with vertigo Some sinus congestion intermittently  Reassuring exam Some improvement from yesterday  Px meclizine to use prn with caution of sedation  Zyrtec and flonase for congestion  inst to use caution with positional change  ER precautions discussed  Update if not starting to improve in a week or if worsening  Given handout on condition and also epely maneuver

## 2021-06-20 ENCOUNTER — Other Ambulatory Visit: Payer: Self-pay | Admitting: Physician Assistant

## 2021-06-20 DIAGNOSIS — Z952 Presence of prosthetic heart valve: Secondary | ICD-10-CM

## 2021-06-20 DIAGNOSIS — R609 Edema, unspecified: Secondary | ICD-10-CM

## 2021-06-20 NOTE — Telephone Encounter (Signed)
This is Dr. Harding's pt. °

## 2021-06-29 ENCOUNTER — Ambulatory Visit
Admission: RE | Admit: 2021-06-29 | Discharge: 2021-06-29 | Disposition: A | Discharge status: Home / Self Care | Payer: PPO | Source: Ambulatory Visit | Attending: Family Medicine | Admitting: Family Medicine

## 2021-06-29 ENCOUNTER — Ambulatory Visit: Payer: Self-pay | Admitting: Radiation Oncology

## 2021-06-29 DIAGNOSIS — Z1231 Encounter for screening mammogram for malignant neoplasm of breast: Secondary | ICD-10-CM | POA: Diagnosis not present

## 2021-07-06 ENCOUNTER — Telehealth: Payer: Self-pay | Admitting: *Deleted

## 2021-07-06 ENCOUNTER — Ambulatory Visit: Payer: PPO

## 2021-07-06 ENCOUNTER — Other Ambulatory Visit: Payer: Self-pay

## 2021-07-06 ENCOUNTER — Encounter: Payer: Self-pay | Admitting: Podiatry

## 2021-07-06 ENCOUNTER — Ambulatory Visit: Payer: PPO | Admitting: Podiatry

## 2021-07-06 DIAGNOSIS — M722 Plantar fascial fibromatosis: Secondary | ICD-10-CM

## 2021-07-06 DIAGNOSIS — M778 Other enthesopathies, not elsewhere classified: Secondary | ICD-10-CM

## 2021-07-06 DIAGNOSIS — M779 Enthesopathy, unspecified: Secondary | ICD-10-CM

## 2021-07-06 NOTE — Telephone Encounter (Signed)
Returned patient's phone call, lvm for a return call 

## 2021-07-06 NOTE — Progress Notes (Signed)
Subjective:   Patient ID: Veronica Greene, female   DOB: 75 y.o.   MRN: [id number]   HPI Patient presents with a long-term history of polio who has had some reoccurrence with contracture of the foot and does well with orthotics which have worn out are no longer fitting her foot as her skin changes in her structure over the last couple years.  She does have discomfort and tries to stay active and without good orthotics has trouble doing this   ROS      Objective:  Physical Exam  Neurovascular status is intact with contracture of the right foot with polio which is probably activated again creating increased contracture with discomfort     Assessment:  Abnormal foot structure with polio as complicating factor     Plan:  H&P we discussed new orthotics and she is motivated to have these made stating the other pair with great benefit allow her allowed her to stay active but or not as effective now.  Met with the ped orthotist we both agree that these would be of benefit for her and patient is casted for a functional type orthotic to reduce stress on her foot that is so impacted by her history of polio

## 2021-07-11 ENCOUNTER — Other Ambulatory Visit: Payer: Self-pay

## 2021-07-11 NOTE — Progress Notes (Signed)
SITUATION Reason for Consult: Evaluation for Bilateral Custom Foot Orthoses Patient / Caregiver Report: Patient is postpolio and would like soft orthotics  OBJECTIVE DATA: Patient History / Diagnosis:    ICD-10-CM   1. Plantar fasciitis  M72.2       Current or Previous Devices: custom insoles  Foot Examination: Skin presentation:   Intact Ulcers & Callousing:   None and no history Toe / Foot Deformities:  Pes cavovarus Weight Bearing Presentation:  Pes cavus Sensation:    Intact  ORTHOTIC RECOMMENDATION Recommended Device: 1x pair of custom functional foot orthotics  GOALS OF ORTHOSES - Reduce Pain - Prevent Foot Deformity - Prevent Progression of Further Foot Deformity - Relieve Pressure - Improve the Overall Biomechanical Function of the Foot and Lower Extremity.  ACTIONS PERFORMED Patient was casted for Foot Orthoses via crush box. Procedure was explained and patient tolerated procedure well. All questions were answered and concerns addressed.  PLAN Potential out of pocket cost was communicated to patient. Casts are to be sent to [facility] for fabrication. Patient is to be called for fitting when devices are ready.

## 2021-07-13 ENCOUNTER — Ambulatory Visit: Payer: PPO | Admitting: Radiation Oncology

## 2021-07-14 ENCOUNTER — Telehealth: Payer: Self-pay | Admitting: Podiatry

## 2021-07-14 ENCOUNTER — Encounter: Payer: Self-pay | Admitting: Radiation Oncology

## 2021-07-14 NOTE — Telephone Encounter (Signed)
Received [employer] auth # [id number] for orthotics ([id number] X2) valid 1.4.2023 thru 4.3.2023.Marland Kitchen

## 2021-07-19 NOTE — Progress Notes (Signed)
Radiation Oncology         [phone] ________________________________  Name: Veronica Greene MRN: [id number]  Date: 07/20/2021  DOB: [birth year]  Follow-Up Visit Note  CC: Tower, [name], MD  Tower, [name], MD    ICD-10-CM   1. Endometrial cancer ([place])  C54.1       Diagnosis: Stage IB Grade 1 Endometrial Cancer  Interval Since Last Radiation:  3 years, 7 months, and 7 days   Radiation treatment dates:  Brachytherapy: 11/20/2017, 11/27/2017, 12/05/2017, 12/09/2017, 12/11/2017   Site/dose:  Vaginal cuff / 6 Gy in 5 fractions for a total dose of 30 Gy  Narrative:  The patient returns today for routine annual follow-up, (she was last seen here for follow up on 06/23/2020). Since her last visit, the patient followed up with Dr. Denman George on 12/15/20. During which time, the patient reported no symptoms concerning for disease recurrence, and was noted as NED on examination.         Pertinent imaging/studies since the patient was last seen includes:  --Bilateral screening mammogram on 06/27/2020 showed no mammographic evidence of malignancy.  --LV DVT study on 08/03/20 showed no evidence of DVT in the right LE. --DXA for bone density on 12/21/20 revealed a T-score of -0.5; indicating normal bone density findings    --Echo on 05/15/21 showed an LV EF of 65-75%. Mitral valve was noted as degenerative, and with severe mitral annular calcifications.    She reports recent onset of swelling in her right lower extremity.  She does have a history of polio in this leg but has never had this issue before.  She also notes some swelling in her left lower extremity in addition.  She has also had some recent right foot problems and has seen Dr. Paulla Dolly and will be set up for additional orthotics and shoe changes to compensate for her Polio.  She in addition was seen by vascular surgery and there is no potential surgical intervention recommended.  she denies any new symptoms such as abdominal bloating  pelvic pain vaginal bleeding or discharge.  She has mild discomfort with intercourse but no postcoital bleeding.  Allergies:  is allergic to clarithromycin, penicillins, and codeine.  Meds: Current Outpatient Medications  Medication Sig Dispense Refill   acetaminophen (TYLENOL) 325 MG tablet Take 650 mg by mouth every 6 (six) hours as needed (for pain.).     acetic acid-hydrocortisone (VOSOL-HC) OTIC solution Place 2 drops into both ears 2 (two) times daily as needed (itching).     amLODipine (NORVASC) 10 MG tablet Take 1 tablet (10 mg total) by mouth daily. 180 tablet 3   Ascorbic Acid (VITAMIN C) 1000 MG tablet Take 1,000 mg by mouth daily with breakfast.     aspirin EC 81 MG tablet Take 81 mg by mouth daily.     Black Pepper-Turmeric (TURMERIC COMPLEX/BLACK PEPPER PO) Take 15 mLs by mouth daily with breakfast.      Calcium-Phosphorus-Vitamin D (CALCIUM/VITAMIN D3/ADULT GUMMY PO) Take 1 tablet by mouth 2 (two) times daily.      cetirizine (ZYRTEC) 10 MG tablet Take 5 mg by mouth as needed for allergies.     cholecalciferol (VITAMIN D) 1000 UNITS tablet Take 1,000 Units by mouth daily with breakfast.      D 1000 25 MCG (1000 UT) capsule 1 Add'l Sig oral Select Frequency     fluticasone (FLONASE) 50 MCG/ACT nasal spray Place 2 sprays into both nostrils at bedtime.      furosemide (  LASIX) 20 MG tablet MAY TAKE 1 TABLET (20 MG TOTAL) UP TO THREE TIMES A WEEK. DO NOT TAKE HCTZ THAT DAY. 30 tablet 11   GUAIFENESIN 1200 PO Take 1,200 mg by mouth at bedtime.      hydrochlorothiazide (HYDRODIURIL) 25 MG tablet Take 1 tablet (25 mg total) by mouth daily with lunch. 90 tablet 3   lubiprostone (AMITIZA) 24 MCG capsule Take 1 capsule (24 mcg total) by mouth 2 (two) times daily. LUNCH & SUPPER 180 capsule 3   meclizine (ANTIVERT) 25 MG tablet Take 0.5-1 tablets (12.5-25 mg total) by mouth 3 (three) times daily as needed for dizziness. Caution of sedation 20 tablet 0   meloxicam (MOBIC) 7.5 MG tablet TAKE  1 TABLET BY MOUTH AT BEDTIME. 90 tablet 3   metoprolol succinate (TOPROL-XL) 25 MG 24 hr tablet TAKE 1 TABLET BY MOUTH DAILY. 90 tablet 3   Multiple Vitamin (MULTIVITAMIN WITH MINERALS) TABS tablet Take 1 tablet by mouth daily.      NEXIUM 40 MG capsule 1 Add'l Sig oral Select Frequency     Polyethyl Glycol-Propyl Glycol 0.4-0.3 % SOLN Place 1-2 drops into both eyes 2 (two) times daily.      potassium chloride (KLOR-CON) 10 MEQ tablet Take 2 tablets (20 mEq total) by mouth 2 (two) times daily. 360 tablet 3   Simethicone (PHAZYME PO) Take 1 tablet by mouth at bedtime as needed (for gas).      sodium chloride (OCEAN) 0.65 % nasal spray Place 1 spray into the nose daily.     triamcinolone (KENALOG) 0.1 % Apply topically.     venlafaxine XR (EFFEXOR XR) 75 MG 24 hr capsule Take 1 capsule (75 mg total) by mouth daily after breakfast. 90 capsule 3   No current facility-administered medications for this encounter.    Physical Findings: The patient is in no acute distress. Patient is alert and oriented.  height is 5' 5.5" (1.664 m) and weight is 185 lb 9.6 oz (84.2 kg). Her temperature is 97.8 F (36.6 C). Her blood pressure is 155/58 (abnormal) and her pulse is 82. Her respiration is 20 and oxygen saturation is 97%. .  No significant changes. Lungs are clear to auscultation bilaterally. Heart has regular rate and rhythm. No palpable cervical, supraclavicular, or axillary adenopathy. Abdomen soft, non-tender, normal bowel sounds.  Some edema noted in the right lower extremity, slight reddish hue to the calf region but no obvious signs of infection.  Mild swelling noted in the left lower extremity.  On pelvic examination the external genitalia are unremarkable.  A speculum exam was performed there are no mucosal lesions noted in the vaginal vault.  No lesions noted at the cuff.  On bimanual and rectovaginal examination there are no pelvic masses appreciated.  Vaginal cuff intact.   Lab Findings: Lab  Results  Component Value Date   WBC 4.9 11/25/2020   HGB 12.1 11/25/2020   HCT 34.5 (L) 11/25/2020   MCV 88.9 11/25/2020   PLT 195.0 11/25/2020    Radiographic Findings: MM 3D SCREEN BREAST BILATERAL  Result Date: 06/29/2021 CLINICAL DATA:  Screening. EXAM: DIGITAL SCREENING BILATERAL MAMMOGRAM WITH TOMOSYNTHESIS AND CAD TECHNIQUE: Bilateral screening digital craniocaudal and mediolateral oblique mammograms were obtained. Bilateral screening digital breast tomosynthesis was performed. The images were evaluated with computer-aided detection. COMPARISON:  Previous exam(s). ACR Breast Density Category a: The breast tissue is almost entirely fatty. FINDINGS: There are no findings suspicious for malignancy. IMPRESSION: No mammographic evidence of malignancy. A result  letter of this screening mammogram will be mailed directly to the patient. RECOMMENDATION: Screening mammogram in one year. (Code:[id number]) BI-RADS CATEGORY  1: Negative. Electronically Signed   By: Valentino Saxon M.D.   On: 06/29/2021 16:59    Impression:  Stage IB Grade 1 Endometrial Cancer  No evidence of recurrence on clinical exam today.  Patient does have edema on her right lower extremity we talked about potential physical therapy evaluation and management.  She does wear a compressive stocking along the right leg but this is not appear to be of good quality per my evaluation.  Plan: She will follow-up with Dr. Berline Lopes in 6 months concerning her endometrial cancer.  Routine follow-up in radiation oncology in 1 year.  She will proceed with physical therapy evaluation as above.  Orders placed today for ambulatory referral to physical therapy.   25 minutes of total time was spent for this patient encounter, including preparation, face-to-face counseling with the patient and coordination of care, physical exam, and documentation of the encounter. ____________________________________  Blair Promise, PhD, MD  This document  serves as a record of services personally performed by Gery Pray, MD. It was created on his behalf by Roney Mans, a trained medical scribe. The creation of this record is based on the scribe's personal observations and the provider's statements to them. This document has been checked and approved by the attending provider.

## 2021-07-20 ENCOUNTER — Other Ambulatory Visit: Payer: Self-pay

## 2021-07-20 ENCOUNTER — Encounter: Payer: Self-pay | Admitting: Radiation Oncology

## 2021-07-20 ENCOUNTER — Ambulatory Visit
Admission: RE | Admit: 2021-07-20 | Discharge: 2021-07-20 | Disposition: A | Discharge status: Home / Self Care | Payer: PPO | Source: Ambulatory Visit | Attending: Radiation Oncology | Admitting: Radiation Oncology

## 2021-07-20 VITALS — BP 155/58 | HR 82 | Temp 97.8°F | Resp 20 | Ht 65.5 in | Wt 185.6 lb

## 2021-07-20 DIAGNOSIS — C541 Malignant neoplasm of endometrium: Secondary | ICD-10-CM

## 2021-07-20 DIAGNOSIS — Z08 Encounter for follow-up examination after completed treatment for malignant neoplasm: Secondary | ICD-10-CM | POA: Diagnosis not present

## 2021-07-20 DIAGNOSIS — Z86718 Personal history of other venous thrombosis and embolism: Secondary | ICD-10-CM | POA: Diagnosis not present

## 2021-07-20 DIAGNOSIS — Z8542 Personal history of malignant neoplasm of other parts of uterus: Secondary | ICD-10-CM | POA: Insufficient documentation

## 2021-07-20 DIAGNOSIS — Z79899 Other long term (current) drug therapy: Secondary | ICD-10-CM | POA: Insufficient documentation

## 2021-07-20 DIAGNOSIS — Z923 Personal history of irradiation: Secondary | ICD-10-CM | POA: Diagnosis not present

## 2021-07-20 DIAGNOSIS — Z7982 Long term (current) use of aspirin: Secondary | ICD-10-CM | POA: Diagnosis not present

## 2021-07-20 HISTORY — DX: Personal history of irradiation: Z92.3

## 2021-07-20 NOTE — Progress Notes (Addendum)
Veronica Greene is here today for follow up post radiation to the pelvic.  They completed their radiation on: 12/21/17  Does the patient complain of any of the following:  Pain:Patient reports having chronic bilateral leg pain.  Abdominal bloating: yes Diarrhea/Constipation: Patient reports having loose stools at times. Nausea/Vomiting: no Vaginal Discharge: no Blood in Urine or Stool: no Urinary Issues (dysuria/incomplete emptying/ incontinence/ increased frequency/urgency): Patient reports having urinary urgency.  Does patient report using vaginal dilator 2-3 times a week and/or sexually active 2-3 weeks: Patient reports being sexually active. Patient reports having vaginal discomfort during  intercourse. Post radiation skin changes: no   Additional comments if applicable:   Vitals:   07/20/21 1350  BP: (!) 155/58  Pulse: 82  Resp: 20  Temp: 97.8 F (36.6 C)  SpO2: 97%  Weight: 185 lb 9.6 oz (84.2 kg)  Height: 5' 5.5" (1.664 m)

## 2021-07-31 DIAGNOSIS — L570 Actinic keratosis: Secondary | ICD-10-CM | POA: Diagnosis not present

## 2021-07-31 DIAGNOSIS — L249 Irritant contact dermatitis, unspecified cause: Secondary | ICD-10-CM | POA: Diagnosis not present

## 2021-07-31 DIAGNOSIS — L810 Postinflammatory hyperpigmentation: Secondary | ICD-10-CM | POA: Diagnosis not present

## 2021-07-31 DIAGNOSIS — L239 Allergic contact dermatitis, unspecified cause: Secondary | ICD-10-CM | POA: Diagnosis not present

## 2021-07-31 DIAGNOSIS — L080 Pyoderma: Secondary | ICD-10-CM | POA: Diagnosis not present

## 2021-08-09 ENCOUNTER — Telehealth: Payer: Self-pay

## 2021-08-09 NOTE — Telephone Encounter (Signed)
Patient called requesting to make a 6 month follow up appt with Dr. Berline Lopes. Pt advised that we only have Dr. Lulu Riding schedule through June of this year. Pt instructed to call at a later date to get this appt scheduled. Patient verbalized understanding.

## 2021-08-22 ENCOUNTER — Other Ambulatory Visit: Payer: Self-pay

## 2021-09-05 ENCOUNTER — Other Ambulatory Visit: Payer: Self-pay | Admitting: Family Medicine

## 2021-09-07 ENCOUNTER — Telehealth: Payer: Self-pay

## 2021-09-07 NOTE — Telephone Encounter (Signed)
Patient called price on Amitiza has increased. She has called insurance and states that they have sent paperwork for her to get at lower cost but have not received information back? Have you received anything from them?  ?

## 2021-09-07 NOTE — Telephone Encounter (Signed)
The authorization number to speed this up is [id number]. Please call. Thank you!

## 2021-09-08 ENCOUNTER — Other Ambulatory Visit: Payer: Self-pay

## 2021-09-08 ENCOUNTER — Ambulatory Visit: Payer: PPO

## 2021-09-08 DIAGNOSIS — M722 Plantar fascial fibromatosis: Secondary | ICD-10-CM

## 2021-09-08 NOTE — Progress Notes (Signed)
SITUATION ?Reason for Consult: Attempted delivery of custom foot orthotics ?Patient / Caregiver Report: Devices were made without metatarsal pads and patient does not feel comfortable without them. ? ?OBJECTIVE DATA ?History / Diagnosis:  ?  ICD-10-CM   ?1. Plantar fasciitis  M72.2   ?  ? ? ?Change in Pathology: None ? ?ACTIONS PERFORMED ?Attempted to fit devices. Patient requests metpads be added, devices to be sent back to manufacture for modification.. All questions answered and concerns addressed. ? ?PLAN ?Patient to be contacted when ready. Plan of care discussed with and agreed upon by patient / caregiver. ? ?

## 2021-09-11 ENCOUNTER — Telehealth: Payer: Self-pay

## 2021-09-11 NOTE — Telephone Encounter (Signed)
Foot Orthotics sent to [facility] for Refurbishment ?

## 2021-09-22 ENCOUNTER — Telehealth: Payer: Self-pay | Admitting: Podiatry

## 2021-09-22 NOTE — Telephone Encounter (Signed)
Left message with husband for [facility] to call back to schedule appt to p/u orthotics ?

## 2021-09-27 ENCOUNTER — Other Ambulatory Visit: Payer: Self-pay

## 2021-09-27 ENCOUNTER — Ambulatory Visit: Payer: PPO

## 2021-09-27 DIAGNOSIS — M722 Plantar fascial fibromatosis: Secondary | ICD-10-CM

## 2021-09-27 DIAGNOSIS — M779 Enthesopathy, unspecified: Secondary | ICD-10-CM

## 2021-09-27 NOTE — Progress Notes (Signed)

## 2021-09-28 NOTE — Telephone Encounter (Signed)
Called insurance and Veronica Greene is a Tier 4, they said they have already denied an Tier exception for this med but the rep will start an appeal (level 1) to try and get it approved, they will fax [place] the appeal form, she did advise me it can take 7 business days before we received any forms or communication from the appeal dpt ? ?(Med is covered but it's just a higher Tier med) ?

## 2021-09-29 ENCOUNTER — Telehealth: Payer: Self-pay | Admitting: Family Medicine

## 2021-09-29 NOTE — Telephone Encounter (Signed)
See prev phone note, I called to try and appeal the tier 4 med amitiza and this is the insurances's response. I don't see Linzess on pt's chart (please review), they will not approve any appeals if pt hasn't tried and failed Linzess, please advise  ?

## 2021-09-29 NOTE — Telephone Encounter (Signed)
Please let pt know ?Is she open to trying linzess?  ?What pharmacy? ?

## 2021-09-29 NOTE — Telephone Encounter (Signed)
Veronica Greene from [employer] has called to discuss the following medication: lubiprostone (AMITIZA) 24 MCG capsule ? ?Needing to know the diagnosis and if the pt had a trail/failure for [facility]  ? ?P: [phone] ?F: [phone] ?

## 2021-09-29 NOTE — Telephone Encounter (Signed)
See separate phone note.

## 2021-10-02 ENCOUNTER — Telehealth: Payer: Self-pay | Admitting: Family Medicine

## 2021-10-02 NOTE — Telephone Encounter (Signed)
Veronica Greene with [employer] called stating that they need a request for the diagnose for medication lubiprostone (AMITIZA) 24 MCG capsule and if pt have trail or failure and reason not to use linzess. Please advise. ?

## 2021-10-02 NOTE — Telephone Encounter (Signed)
Called pt and she said that the [place] is working well and she doesn't want to change med. Pt said it's covered it's just a tier 4 but she is willing to pay higher price  ? ?Left VM with Lennette Bihari [facility]) and let him know pt wants to stay on Amitiza and doesn't want to try Linzess ? ?FYI to PCP ?

## 2021-10-02 NOTE — Telephone Encounter (Signed)
See separate phone note. Will discuss with pt using Linzess since it's hasn't tried and failed med  ?

## 2021-10-04 DIAGNOSIS — M170 Bilateral primary osteoarthritis of knee: Secondary | ICD-10-CM | POA: Diagnosis not present

## 2021-10-06 ENCOUNTER — Ambulatory Visit
Admission: EM | Admit: 2021-10-06 | Discharge: 2021-10-06 | Disposition: A | Discharge status: Home / Self Care | Payer: PPO | Attending: Physician Assistant | Admitting: Physician Assistant

## 2021-10-06 ENCOUNTER — Telehealth: Payer: Self-pay

## 2021-10-06 DIAGNOSIS — K529 Noninfective gastroenteritis and colitis, unspecified: Secondary | ICD-10-CM

## 2021-10-06 MED ORDER — ONDANSETRON 4 MG PO TBDP: 4.0000 mg | ORAL_TABLET | Freq: Three times a day (TID) | ORAL | 0 refills | Status: DC | PRN

## 2021-10-06 NOTE — Telephone Encounter (Signed)
Aware, will watch for correspondence  

## 2021-10-06 NOTE — Telephone Encounter (Signed)
Noted  

## 2021-10-06 NOTE — Telephone Encounter (Signed)
Per chart review tab pt is presently at [facility]. Sending note to DR Glori Bickers who is out of office and Dr Diona Browner who is in office and [place] CMA. ?

## 2021-10-06 NOTE — ED Triage Notes (Signed)
Pt c/o nausea, diarrhea, vomiting, feels "yucky"  ? ?Denies body aches or chills ? ?Onset today. States had fall on Sunday.  ?

## 2021-10-06 NOTE — Telephone Encounter (Signed)
[facility] - Client ?TELEPHONE ADVICE RECORD ?[facility]? ?Patient ?Name: ?Shahla KIRKM ?AN ?Gender: Female ?DOB: 29-Oct-1945 ?Age: 76 Y 43 M 14 D ?Return ?Phone ?Number: ?[id number] ?(Primary), ?[id number] ?(Secondary) ?Address: [address] ?City/ ?State/ ?Zip: ?[place] ?[address] ?Client [facility] - Client ?Client Site [facility] ?Provider Tower, Roque Lias - MD ?Contact Type Call ?Who Is Calling Patient / Member / Family / Caregiver ?Call Type Triage / Clinical ?Relationship To Patient Self ?Return Phone Number [phone] (Primary) ?Chief Complaint Vomiting ?Reason for Call Symptomatic / Request for Health Information ?Initial Comment Caller states that she is having vomiting, and ?diarrhea. No fever. ?Translation No ?Nurse Assessment ?Nurse: Nicki Reaper, RN, Malachy Mood Date/Time Eilene Ghazi Time): 10/06/2021 12:26:33 PM ?Confirm and document reason for call. If ?symptomatic, describe symptoms. ?---Caller states she has vomiting with diarrhea that ?started this morning around 0600 am, has vomited ?several times today and has had several bouts of ?diarrhea today, abd pain with diarrhea and vomiting, ?Current Temp-99.7 temporal, denies fever and other ?symptoms, ?Does the patient have any new or worsening ?symptoms? ---Yes ?Will a triage be completed? ---Yes ?Related visit to physician within the last 2 weeks? ---No ?Does the PT have any chronic conditions? (i.e. ?diabetes, asthma, this includes High risk factors for ?pregnancy, etc.) ?---Yes ?List chronic conditions. ---HTN ?Is this a behavioral health or substance abuse call? ---No ?Guidelines ?Guideline Title Affirmed Question Affirmed Notes Nurse Date/Time (Eastern ?Time) ?Vomiting [1] MODERATE ?vomiting (e.g., 3 - 5 ?times/day) AND [2] ?age > 79 years ?Nicki Reaper, RN, Malachy Mood 10/06/2021 12:31:17 ?PM ?Head Injury Scalp swelling, bruise ?or pain ?Nicki Reaper, RN, Malachy Mood 10/06/2021 12:36:06 ?PM ?PLEASE NOTE: All  timestamps contained within this report are represented as [place] Standard Time. ?CONFIDENTIALTY NOTICE: This fax transmission is intended only for the addressee. It contains information that is legally privileged, confidential or ?otherwise protected from use or disclosure. If you are not the intended recipient, you are strictly prohibited from reviewing, disclosing, copying using ?or disseminating any of this information or taking any action in reliance on or regarding this information. If you have received this fax in error, please ?notify us immediately by telephone so that we can arrange for its return to [place]. Phone: [phone], Toll-Free: [phone], Fax: [phone] ?Page: 2 of 2 ?Call Id: [id number] ?Disp. Time (Eastern ?Time) Disposition Final User ?10/06/2021 12:35:54 PM Go to ED Now (or PCP triage) Nicki Reaper, RN, Malachy Mood ?10/06/2021 12:38:15 PM Home Care Yes Nicki Reaper, RN, Malachy Mood ?Caller Disagree/Comply Comply ?Caller Understands Yes ?PreDisposition Call Doctor ?Care Advice Given Per Guideline ?GO TO ED NOW (OR PCP TRIAGE): * IF NO PCP (PRIMARY CARE PROVIDER) SECOND-LEVEL TRIAGE: You need to be ?seen within the next hour. Go to the [place] at _____________ [place] as soon as you can. BRING A BUCKET IN CASE ?OF VOMITING: * You may wish to bring a bucket, pan, or plastic bag with you in case there is more vomiting during the drive. ?BRING MEDICINES: * Please bring a list of your current medicines when you go to see the doctor. ?* You should be able to treat this at home. HOME CARE: REASSURANCE AND EDUCATION - DIRECT BLOW ?(CONTUSION, BRUISE): * This sounds like a scalp injury rather than a brain injury or concussion. Treatment at home should ?be safe. A direct blow to your scalp can cause a contusion. Contusion is the medical term for bruise. CALL BACK IF: * Severe ?headache persists over 2 hours after ice pack  and pain medications * You become worse ?Comments ?User: Burna Sis, RN Date/Time Eilene Ghazi  Time): 10/06/2021 12:38:58 PM ?During guideline questions caller stated she had a fall on Sunday and hit her head, ?Referrals ?GO TO FACILITY UNDECIDE ?

## 2021-10-06 NOTE — ED Provider Notes (Signed)
?[facility] ? ? ? ?CSN: [id number] ?Arrival date & time: 10/06/21  1407 ? ? ?  ? ?History   ?Chief Complaint ?Chief Complaint  ?Patient presents with  ? "yucky"  ? ? ?HPI ?Veronica Greene is a 76 y.o. female.  ? ?Patient here today for evaluation of nausea, vomiting, and diarrhea that started this morning. She has not had fever. She denies any significant abdominal pain. She has not tried any medication for symptoms. ? ?The history is provided by the patient.  ? ?Past Medical History:  ?Diagnosis Date  ? Anxiety and depression 08/26/2007  ? Qualifier: Diagnosis of  By: Glori Bickers MD, Carmell Austria   ? Arthritis   ? knees  ? Constipation, slow transit 11/03/2010  ? Degenerative disk disease 08/31/2011  ? Depression with anxiety   ? DERMATITIS, SEBORRHEIC 08/25/2010  ? Qualifier: Diagnosis of  By: Glori Bickers MD, Carmell Austria   ? Endometrial cancer ([place])   ? 02/ 2019  Diagnosed with D&C/hysteroscopy with polypectomy  ? Essential hypertension 08/29/2017  ? Fibromyalgia   ? tx mobic  ? Full dentures   ? GERD 08/26/2007  ? Qualifier: Diagnosis of  By: Glori Bickers MD, Carmell Austria   ? Gout   ? Hearing loss   ? wears bilateral hearing aids  ? History of colon polyps   ? History of nonmelanoma skin cancer   ? right lower leg  ? History of poliomyelitis 08/26/2007  ? Qualifier: History of  By: Glori Bickers MD, Carmell Austria   ? History of radiation therapy   ? Endometium- 11/20/17-12/11/17- Vag Cuff -Dr. Gery Pray  ? Hyperlipidemia, mild 09/02/2012  ? Hypertension   ? Hypokalemia 01/02/2011  ? IBS (irritable bowel syndrome)   ? with constipation  ? Obesity 08/26/2007  ? Qualifier: Diagnosis of  By: Glori Bickers MD, Carmell Austria   ? OSTEOARTHRITIS, HANDS, BILATERAL 08/26/2007  ? Qualifier: Diagnosis of  By: Glori Bickers MD, Carmell Austria   ? Osteopenia   ? OVERACTIVE BLADDER 08/26/2007  ? Qualifier: Diagnosis of  By: Glori Bickers MD, Carmell Austria   ? Personal history of colonic polyps 11/03/2010  ? 05/2018 13 polyps mostly adenomas, ssp's - recall 1 year 2020 Gatha Mayer, MD, [facility]   ? Poor balance 11/25/2018  ? With falls  Has rise in R shoe/post polio  Weak legs as well    ? S/P TAVR (transcatheter aortic valve replacement)   ? s/p TAVR with a 64m Edwards S3U via the TF approach by Dr. BCyndia Bentand Dr. MAngelena Form ? Severe calcific aortic stenosis 08/27/2017  ?  -> Became severe as of October 2020-referred for TAVR on 04/28/2019  ? URINARY INCONTINENCE 08/26/2007  ? Qualifier: Diagnosis of  By: TGlori BickersMD, MCarmell Austria  ? Wears hearing aid in both ears   ? ? ?Patient Active Problem List  ? Diagnosis Date Noted  ? Cyst, vulva 11/29/2020  ? Multiple falls 03/30/2020  ? Lower extremity edema 02/21/2020  ? [place]omet[place] 09/21/2019  ? S/P TAVR (transcatheter aortic valve replacement)   ? Poor balance 11/25/2018  ? History of endometrial cancer 09/12/2017  ? Essential hypertension 08/29/2017  ? Severe calcific aortic stenosis 08/27/2017  ? Estrogen deficiency 11/05/2016  ? Routine general medical examination at a health care facility 10/15/2015  ? Hyperlipidemia with target LDL less than 100 09/02/2012  ? Degenerative disk disease 08/31/2011  ? Personal history of colonic polyps 11/03/2010  ? Gout 08/25/2010  ? DERMATITIS, SEBORRHEIC 08/25/2010  ?  Osteopenia 09/05/2007  ? History of poliomyelitis 08/26/2007  ? Obesity 08/26/2007  ? Anxiety and depression 08/26/2007  ? ALLERGIC RHINITIS 08/26/2007  ? GERD 08/26/2007  ? IBS 08/26/2007  ? OVERACTIVE BLADDER 08/26/2007  ? OSTEOARTHRITIS, HANDS, BILATERAL 08/26/2007  ? Vertigo 08/26/2007  ? URINARY INCONTINENCE 08/26/2007  ? ? ?Past Surgical History:  ?Procedure Laterality Date  ? ANKLE SURGERY Right 1956  ? CARPAL TUNNEL RELEASE Bilateral 1986, 1993  ? CATARACT EXTRACTION Right 11/16/2019  ? COLONOSCOPY N/A 06/24/2015  ? Procedure: COLONOSCOPY;  Surgeon: Gatha Mayer, MD;  Location: [facility];  Service: Endoscopy;  Laterality: N/A;  ? DILATATION & CURETTAGE/HYSTEROSCOPY WITH MYOSURE N/A 08/09/2017  ? Procedure: DILATATION &  CURETTAGE/HYSTEROSCOPY WITH MYOSURE;  Surgeon: Christophe Louis, MD;  Location: [facility];  Service: Gynecology;  Laterality: N/A;  Polypectomy  ? DILATION AND CURETTAGE OF UTERUS    ? PMB  ? HAND SURGERY Bilateral 1999  ? Thumbs   ? KNEE SURGERY Bilateral 1998  ? x2  ? LYMPH NODE BIOPSY N/A 10/08/2017  ? Procedure: LYMPH NODE BIOPSY;  Surgeon: [name], MD;  Location: [facility];  Service: Gynecology;  Laterality: N/A;  ? MULTIPLE TOOTH EXTRACTIONS    ? NASAL SINUS SURGERY  1976  ? polio surgery.    ? right foot - right leg 1.5" shorter than left leg - some wekness  ? RIGHT/LEFT HEART CATH AND CORONARY ANGIOGRAPHY N/A 04/09/2019  ? Procedure: RIGHT/LEFT HEART CATH AND CORONARY ANGIOGRAPHY;  Surgeon: Leonie Man, MD;  Location: [place] [facility];  Service: Cardiovascular;; Severe AS, mean gradient 50 mmHg P-P 55 mmHg (estimated AVA 0.76 cm?).  Mildly elevated pulmonary pressures with elevated LVEDP and PCWP.  Angiographically normal coronary arteries, but tortuous.  ? ROBOTIC ASSISTED TOTAL HYSTERECTOMY WITH BILATERAL SALPINGO OOPHERECTOMY Bilateral 10/08/2017  ? Procedure: XI ROBOTIC ASSISTED TOTAL HYSTERECTOMY WITH BILATERAL SALPINGO OOPHORECTOMY;  Surgeon: [name], MD;  Location: [facility];  Service: Gynecology;  Laterality: Bilateral;  ? TEE WITHOUT CARDIOVERSION N/A 04/28/2019  ? Procedure: TRANSESOPHAGEAL ECHOCARDIOGRAM (TEE);  Surgeon: Burnell Blanks, MD;  Location: [place] [facility];  Service: Open Heart Surgery;  Laterality: N/A;  ? THUMB ARTHROSCOPY  1999  ? TONSILLECTOMY  3638  ? 76 years old  ? TRANSCATHETER AORTIC VALVE REPLACEMENT, TRANSFEMORAL N/A 04/28/2019  ? Procedure: TRANSCATHETER AORTIC VALVE REPLACEMENT, TRANSFEMORAL;  Surgeon: Burnell Blanks, MD;  Location: [place] [facility]; ; 23 mm Sapien 3 Ultra THV via TF approach. (post-op mean AV gradient 13 mmHg)  ? TRANSTHORACIC ECHOCARDIOGRAM  05/04/2020  ? EF 60 to 65%.  Normal LV function and wall motion.  Indeterminate diastolic  parameters.  Normal RV size and pressure.  Moderate LA dilation.  Moderate MAC but no significant MR.  Post TAVR with 23 mm Sapien 3 valve: Mean gradient 14 mmHg.  No PVL.  Normal IVC  ? TRANSTHORACIC ECHOCARDIOGRAM  02/17/2019  ? Mod LVH. GR 2 DD. Severe AS (mean gradient = 44 mmHg with Mod AI. mild MS.  ? TRANSTHORACIC ECHOCARDIOGRAM  04/29/2019; 05/2020  ? a)  (post TAVR): EF 60-65%.  GRII DD.  Mod LA dilation & nl RA.  Mod MAC.  No  AI (no-PVL), mean AV gradient 13 mmHg.;; b) (2nd post TAVR) EF 60 to 65%.  Severely increased thickness/LVH.  GRII DD with only mildly elevated left atrial pressure.  Normal RA size.  Severe MAC with only trace MR.  No MS.  TAVR valve prosthesis functioning well.  Trivial AI.  Mean gradient 15 mmHg with a peak 29.8 mmHg  ? TUBAL LIGATION    ? UPPER GI ENDOSCOPY    ? normal  ? WISDOM TOOTH EXTRACTION    ? ? ?OB History   ?No obstetric history on file. ?  ? ? ? ?Home Medications   ? ?Prior to Admission medications   ?Medication Sig Start Date End Date Taking? Authorizing Provider  ?ondansetron (ZOFRAN-ODT) 4 MG disintegrating tablet Take 1 tablet (4 mg total) by mouth every 8 (eight) hours as needed. 10/06/21  Yes Francene Finders, PA-C  ?acetaminophen (TYLENOL) 325 MG tablet Take 650 mg by mouth every 6 (six) hours as needed (for pain.).    [provider]  ?acetic acid-hydrocortisone (VOSOL-HC) OTIC solution Place 2 drops into both ears 2 (two) times daily as needed (itching).    [provider]  ?amLODipine (NORVASC) 10 MG tablet Take 1 tablet (10 mg total) by mouth daily. 05/01/21 07/30/21  Leonie Man, MD  ?Ascorbic Acid (VITAMIN C) 1000 MG tablet Take 1,000 mg by mouth daily with breakfast.    [provider]  ?aspirin EC 81 MG tablet Take 81 mg by mouth daily.    [provider]  ?Black Pepper-Turmeric (TURMERIC COMPLEX/BLACK PEPPER PO) Take 15 mLs by mouth daily with breakfast.     [provider]  ?Calcium-Phosphorus-Vitamin D  (CALCIUM/VITAMIN D3/ADULT GUMMY PO) Take 1 tablet by mouth 2 (two) times daily.     [provider]  ?cetirizine (ZYRTEC) 10 MG tablet Take 5 mg by mouth as needed for allergies.    [provider]  ?

## 2021-10-17 ENCOUNTER — Telehealth: Payer: Self-pay | Admitting: *Deleted

## 2021-10-17 NOTE — Telephone Encounter (Signed)
Returned the patient's call and explained that the July scheduled isn't ready, but the office will call her once it is ready to schedule ?

## 2021-10-18 DIAGNOSIS — M170 Bilateral primary osteoarthritis of knee: Secondary | ICD-10-CM | POA: Diagnosis not present

## 2021-10-25 DIAGNOSIS — M170 Bilateral primary osteoarthritis of knee: Secondary | ICD-10-CM | POA: Diagnosis not present

## 2021-10-26 DIAGNOSIS — L570 Actinic keratosis: Secondary | ICD-10-CM | POA: Diagnosis not present

## 2021-10-26 DIAGNOSIS — L820 Inflamed seborrheic keratosis: Secondary | ICD-10-CM | POA: Diagnosis not present

## 2021-10-26 DIAGNOSIS — L814 Other melanin hyperpigmentation: Secondary | ICD-10-CM | POA: Diagnosis not present

## 2021-10-26 DIAGNOSIS — Z08 Encounter for follow-up examination after completed treatment for malignant neoplasm: Secondary | ICD-10-CM | POA: Diagnosis not present

## 2021-10-26 DIAGNOSIS — L538 Other specified erythematous conditions: Secondary | ICD-10-CM | POA: Diagnosis not present

## 2021-10-26 DIAGNOSIS — L239 Allergic contact dermatitis, unspecified cause: Secondary | ICD-10-CM | POA: Diagnosis not present

## 2021-10-26 DIAGNOSIS — D225 Melanocytic nevi of trunk: Secondary | ICD-10-CM | POA: Diagnosis not present

## 2021-10-26 DIAGNOSIS — S30860A Insect bite (nonvenomous) of lower back and pelvis, initial encounter: Secondary | ICD-10-CM | POA: Diagnosis not present

## 2021-10-26 DIAGNOSIS — Z86007 Personal history of in-situ neoplasm of skin: Secondary | ICD-10-CM | POA: Diagnosis not present

## 2021-10-26 DIAGNOSIS — L821 Other seborrheic keratosis: Secondary | ICD-10-CM | POA: Diagnosis not present

## 2021-10-26 DIAGNOSIS — L923 Foreign body granuloma of the skin and subcutaneous tissue: Secondary | ICD-10-CM | POA: Diagnosis not present

## 2021-10-30 ENCOUNTER — Encounter: Payer: Self-pay | Admitting: Nurse Practitioner

## 2021-10-30 ENCOUNTER — Ambulatory Visit (INDEPENDENT_AMBULATORY_CARE_PROVIDER_SITE_OTHER): Payer: PPO | Admitting: Nurse Practitioner

## 2021-10-30 VITALS — BP 148/64 | HR 60 | Ht 66.0 in | Wt 177.3 lb

## 2021-10-30 DIAGNOSIS — I10 Essential (primary) hypertension: Secondary | ICD-10-CM

## 2021-10-30 DIAGNOSIS — Z952 Presence of prosthetic heart valve: Secondary | ICD-10-CM

## 2021-10-30 DIAGNOSIS — E782 Mixed hyperlipidemia: Secondary | ICD-10-CM | POA: Diagnosis not present

## 2021-10-30 DIAGNOSIS — I350 Nonrheumatic aortic (valve) stenosis: Secondary | ICD-10-CM | POA: Diagnosis not present

## 2021-10-30 DIAGNOSIS — R600 Localized edema: Secondary | ICD-10-CM | POA: Diagnosis not present

## 2021-10-30 DIAGNOSIS — H9319 Tinnitus, unspecified ear: Secondary | ICD-10-CM

## 2021-10-30 NOTE — Patient Instructions (Signed)
Medication Instructions:  ?Your physician recommends that you continue on your current medications as directed. Please refer to the Current Medication list given to you today.  ? ?*If you need a refill on your cardiac medications before your next appointment, please call your pharmacy* ? ? ?Lab Work: ?NONE ordered at this time of appointment  ? ?If you have labs (blood work) drawn today and your tests are completely normal, you will receive your results only by: ?MyChart Message (if you have MyChart) OR ?A paper copy in the mail ?If you have any lab test that is abnormal or we need to change your treatment, we will call you to review the results. ? ? ?Testing/Procedures: ?NONE ordered at this time of appointment  ? ? ?Follow-Up: ?At [facility], you and your health needs are our priority.  As part of our continuing mission to provide you with exceptional heart care, we have created designated Provider Care Teams.  These Care Teams include your primary Cardiologist (physician) and Advanced Practice Providers (APPs -  Physician Assistants and Nurse Practitioners) who all work together to provide you with the care you need, when you need it. ? ?We recommend signing up for the patient portal called "MyChart".  Sign up information is provided on this After Visit Summary.  MyChart is used to connect with patients for Virtual Visits (Telemedicine).  Patients are able to view lab/test results, encounter notes, upcoming appointments, etc.  Non-urgent messages can be sent to your provider as well.   ?To learn more about what you can do with MyChart, go to [email or web address].   ? ?Your next appointment:   ?6 month(s) ? ?The format for your next appointment:   ?In Person ? ?Provider:   ?Glenetta Hew, MD   ? ? ?Other Instructions ?Monitor Blood Pressure. Report if Blood Pressure is consistently greater than 130/80  ? ?Important Information About Sugar ? ? ? ? ? ? ?

## 2021-10-30 NOTE — Progress Notes (Signed)
? ? ?Office Visit  ?  ?Patient Name: Veronica Greene ?Date of Encounter: 10/30/2021 ? ?Primary Care Provider:  Abner Greenspan, MD ?Primary Cardiologist:  Glenetta Hew, MD ? ?Chief Complaint  ?  ?76 year old female with a history  of severe AS s/p TAVR (04/2019), hypertension, GERD, fibromyalgia, endometrial cancer, postpolio syndrome, depression, and anxiety who presents for follow-up related to aortic stenosis and hypertension.  ? ?Past Medical History  ?  ?Past Medical History:  ?Diagnosis Date  ? Anxiety and depression 08/26/2007  ? Qualifier: Diagnosis of  By: Glori Bickers MD, Carmell Austria   ? Arthritis   ? knees  ? Constipation, slow transit 11/03/2010  ? Degenerative disk disease 08/31/2011  ? Depression with anxiety   ? DERMATITIS, SEBORRHEIC 08/25/2010  ? Qualifier: Diagnosis of  By: Glori Bickers MD, Carmell Austria   ? Endometrial cancer ([place])   ? 02/ 2019  Diagnosed with D&C/hysteroscopy with polypectomy  ? Essential hypertension 08/29/2017  ? Fibromyalgia   ? tx mobic  ? Full dentures   ? GERD 08/26/2007  ? Qualifier: Diagnosis of  By: Glori Bickers MD, Carmell Austria   ? Gout   ? Hearing loss   ? wears bilateral hearing aids  ? History of colon polyps   ? History of nonmelanoma skin cancer   ? right lower leg  ? History of poliomyelitis 08/26/2007  ? Qualifier: History of  By: Glori Bickers MD, Carmell Austria   ? History of radiation therapy   ? Endometium- 11/20/17-12/11/17- Vag Cuff -Dr. Gery Pray  ? Hyperlipidemia, mild 09/02/2012  ? Hypertension   ? Hypokalemia 01/02/2011  ? IBS (irritable bowel syndrome)   ? with constipation  ? Obesity 08/26/2007  ? Qualifier: Diagnosis of  By: Glori Bickers MD, Carmell Austria   ? OSTEOARTHRITIS, HANDS, BILATERAL 08/26/2007  ? Qualifier: Diagnosis of  By: Glori Bickers MD, Carmell Austria   ? Osteopenia   ? OVERACTIVE BLADDER 08/26/2007  ? Qualifier: Diagnosis of  By: Glori Bickers MD, Carmell Austria   ? Personal history of colonic polyps 11/03/2010  ? 05/2018 13 polyps mostly adenomas, ssp's - recall 1 year 2020 Gatha Mayer, MD, [facility]   ?  Poor balance 11/25/2018  ? With falls  Has rise in R shoe/post polio  Weak legs as well    ? S/P TAVR (transcatheter aortic valve replacement)   ? s/p TAVR with a 2m Edwards S3U via the TF approach by Dr. BCyndia Bentand Dr. MAngelena Form ? Severe calcific aortic stenosis 08/27/2017  ?  -> Became severe as of October 2020-referred for TAVR on 04/28/2019  ? URINARY INCONTINENCE 08/26/2007  ? Qualifier: Diagnosis of  By: TGlori BickersMD, MCarmell Austria  ? Wears hearing aid in both ears   ? ?Past Surgical History:  ?Procedure Laterality Date  ? ANKLE SURGERY Right 1956  ? CARPAL TUNNEL RELEASE Bilateral 1986, 1993  ? CATARACT EXTRACTION Right 11/16/2019  ? COLONOSCOPY N/A 06/24/2015  ? Procedure: COLONOSCOPY;  Surgeon: CGatha Mayer MD;  [facility]: [facility];  Service: Endoscopy;  Laterality: N/A;  ? DILATATION & CURETTAGE/HYSTEROSCOPY WITH MYOSURE N/A 08/09/2017  ? Procedure: DILATATION & CURETTAGE/HYSTEROSCOPY WITH MYOSURE;  Surgeon: CChristophe Louis MD;  [facility];  Service: Gynecology;  Laterality: N/A;  Polypectomy  ? DILATION AND CURETTAGE OF UTERUS    ? PMB  ? HAND SURGERY Bilateral 1999  ? Thumbs   ? KNEE SURGERY Bilateral 1998  ? x2  ? LYMPH NODE BIOPSY N/A 10/08/2017  ? Procedure: LYMPH NODE BIOPSY;  Surgeon: [name], MD;  Location: [facility];  Service: Gynecology;  Laterality: N/A;  ? MULTIPLE TOOTH EXTRACTIONS    ? NASAL SINUS SURGERY  1976  ? polio surgery.    ? right foot - right leg 1.5" shorter than left leg - some wekness  ? RIGHT/LEFT HEART CATH AND CORONARY ANGIOGRAPHY N/A 04/09/2019  ? Procedure: RIGHT/LEFT HEART CATH AND CORONARY ANGIOGRAPHY;  Surgeon: Leonie Man, MD;  Location: [place] [facility];  Service: Cardiovascular;; Severe AS, mean gradient 50 mmHg P-P 55 mmHg (estimated AVA 0.76 cm?).  Mildly elevated pulmonary pressures with elevated LVEDP and PCWP.  Angiographically normal coronary arteries, but tortuous.  ? ROBOTIC ASSISTED TOTAL HYSTERECTOMY WITH BILATERAL SALPINGO OOPHERECTOMY Bilateral  10/08/2017  ? Procedure: XI ROBOTIC ASSISTED TOTAL HYSTERECTOMY WITH BILATERAL SALPINGO OOPHORECTOMY;  Surgeon: [name], MD;  [facility]: [facility];  Service: Gynecology;  Laterality: Bilateral;  ? TEE WITHOUT CARDIOVERSION N/A 04/28/2019  ? Procedure: TRANSESOPHAGEAL ECHOCARDIOGRAM (TEE);  Surgeon: Burnell Blanks, MD;  Location: [place] [facility];  Service: Open Heart Surgery;  Laterality: N/A;  ? THUMB ARTHROSCOPY  1999  ? TONSILLECTOMY  4665  ? 76 years old  ? TRANSCATHETER AORTIC VALVE REPLACEMENT, TRANSFEMORAL N/A 04/28/2019  ? Procedure: TRANSCATHETER AORTIC VALVE REPLACEMENT, TRANSFEMORAL;  Surgeon: Burnell Blanks, MD;  Location: [place] [facility]; ; 23 mm Sapien 3 Ultra THV via TF approach. (post-op mean AV gradient 13 mmHg)  ? TRANSTHORACIC ECHOCARDIOGRAM  05/04/2020  ? EF 60 to 65%.  Normal LV function and wall motion.  Indeterminate diastolic parameters.  Normal RV size and pressure.  Moderate LA dilation.  Moderate MAC but no significant MR.  Post TAVR with 23 mm Sapien 3 valve: Mean gradient 14 mmHg.  No PVL.  Normal IVC  ? TRANSTHORACIC ECHOCARDIOGRAM  02/17/2019  ? Mod LVH. GR 2 DD. Severe AS (mean gradient = 44 mmHg with Mod AI. mild MS.  ? TRANSTHORACIC ECHOCARDIOGRAM  04/29/2019; 05/2020  ? a)  (post TAVR): EF 60-65%.  GRII DD.  Mod LA dilation & nl RA.  Mod MAC.  No  AI (no-PVL), mean AV gradient 13 mmHg.;; b) (2nd post TAVR) EF 60 to 65%.  Severely increased thickness/LVH.  GRII DD with only mildly elevated left atrial pressure.  Normal RA size.  Severe MAC with only trace MR.  No MS.  TAVR valve prosthesis functioning well.  Trivial AI.  Mean gradient 15 mmHg with a peak 29.8 mmHg  ? TUBAL LIGATION    ? UPPER GI ENDOSCOPY    ? normal  ? WISDOM TOOTH EXTRACTION    ? ? ?Allergies ? ?Allergies  ?Allergen Reactions  ? Clarithromycin Swelling and Other (See Comments)  ?  Throat Swells  ? Penicillins Hives and Other (See Comments)  ?  WELTS ?Has patient had a PCN reaction causing  immediate rash, facial/tongue/throat swelling, SOB or lightheadedness with hypotension: No ?Has patient had a PCN reaction causing severe rash involving mucus membranes or skin necrosis: Unknown ?Has patient had a PCN reaction that required hospitalization:No ?Has patient had a PCN reaction occurring within the last 10 years: No ?If all of the above answers are "NO", then may proceed with Cephalosporin use. ?  ? Codeine Nausea And Vomiting  ? ? ?History of Present Illness  ?  ?76 year old female with the above past medical history including severe AS s/p TAVR (04/2019), hypertension, GERD, fibromyalgia, endometrial cancer, postpolio syndrome, depression, and anxiety. ? ?She has a history of postpolio syndrome  with right leg weakness and decreased length.  She has a history of issues with balance and recurrent falls.  She has history of severe aortic stenosis, initially diagnosed in 2013 when she was noted to have a heart murmur.  She reported progressive fatigue.  Cardiac catheterization in October 2020 showed no obstructive CAD.  She underwent successful TAVR with a 23 mm Edwards SAPIEN 3 ultra THV via the TF approach in October 2020.  Postop echo showed EF 60 to 65%, normally functioning TAVR with a mean gradient of 13 mmHg, and no PVL.  She was last seen in the office on 05/01/2021 and was noted to have elevated BP.  Her amlodipine was increased.  Echocardiogram in November 2022 showed EF 65 to 70%, normal LV function, G2 DD, stable prosthetic TAVR, mean gradient 18.0 mmHg. ? ?She presents today for follow-up accompanied by her husband.  Since her last visit has done well from a cardiac standpoint.  She denies dyspnea, PND, orthopnea, denies palpitations.  Denies symptoms concerning for angina.  Overall, she reports feeling well.  She has noticed some tinnitus in her ears, occasional dizziness, she was started on meclizine per her PCP.  Otherwise, she denies any new concerns today. ? ?Home Medications  ?   ?Current Outpatient Medications  ?Medication Sig Dispense Refill  ? acetaminophen (TYLENOL) 325 MG tablet Take 650 mg by mouth every 6 (six) hours as needed (for pain.).    ? acetic acid-hydrocortisone (VOSOL-HC) OTIC

## 2021-11-01 DIAGNOSIS — M170 Bilateral primary osteoarthritis of knee: Secondary | ICD-10-CM | POA: Diagnosis not present

## 2021-11-20 DIAGNOSIS — J309 Allergic rhinitis, unspecified: Secondary | ICD-10-CM | POA: Diagnosis not present

## 2021-11-20 DIAGNOSIS — Z9089 Acquired absence of other organs: Secondary | ICD-10-CM | POA: Diagnosis not present

## 2021-11-20 DIAGNOSIS — K219 Gastro-esophageal reflux disease without esophagitis: Secondary | ICD-10-CM | POA: Insufficient documentation

## 2021-11-20 DIAGNOSIS — Z87891 Personal history of nicotine dependence: Secondary | ICD-10-CM | POA: Diagnosis not present

## 2021-11-20 DIAGNOSIS — H6122 Impacted cerumen, left ear: Secondary | ICD-10-CM | POA: Diagnosis not present

## 2021-11-20 DIAGNOSIS — Z9889 Other specified postprocedural states: Secondary | ICD-10-CM | POA: Diagnosis not present

## 2021-11-21 ENCOUNTER — Telehealth: Payer: Self-pay | Admitting: Family Medicine

## 2021-11-21 DIAGNOSIS — I10 Essential (primary) hypertension: Secondary | ICD-10-CM

## 2021-11-21 DIAGNOSIS — E785 Hyperlipidemia, unspecified: Secondary | ICD-10-CM

## 2021-11-21 NOTE — Telephone Encounter (Signed)
-----   Message from Velna Hatchet, RT sent at 11/06/2021 10:19 AM EDT ----- ?Regarding: Lab Thu 11/23/21 ?Patient is scheduled for cpx, please order future labs.  Thanks, Anda Kraft ? ? ?

## 2021-11-23 ENCOUNTER — Telehealth: Payer: Self-pay | Admitting: *Deleted

## 2021-11-23 ENCOUNTER — Other Ambulatory Visit (INDEPENDENT_AMBULATORY_CARE_PROVIDER_SITE_OTHER): Payer: PPO

## 2021-11-23 DIAGNOSIS — E785 Hyperlipidemia, unspecified: Secondary | ICD-10-CM

## 2021-11-23 DIAGNOSIS — I10 Essential (primary) hypertension: Secondary | ICD-10-CM | POA: Diagnosis not present

## 2021-11-23 LAB — LIPID PANEL
Cholesterol: 179 mg/dL (ref 0–200)
HDL: 48.8 mg/dL (ref >39.00)
LDL Cholesterol: 100 mg/dL — ABNORMAL HIGH (ref 0–99)
NonHDL: 130.64
Total CHOL/HDL Ratio: 4
Triglycerides: 153 mg/dL — ABNORMAL HIGH (ref 0.0–149.0)
VLDL: 30.6 mg/dL (ref 0.0–40.0)

## 2021-11-23 LAB — CBC WITH DIFFERENTIAL/PLATELET
Basophils Absolute: 0 10*3/uL (ref 0.0–0.1)
Basophils Relative: 0.6 % (ref 0.0–3.0)
Eosinophils Absolute: 0.1 10*3/uL (ref 0.0–0.7)
Eosinophils Relative: 2.8 % (ref 0.0–5.0)
HCT: 36 % (ref 36.0–46.0)
Hemoglobin: 12.5 g/dL (ref 12.0–15.0)
Lymphocytes Relative: 23.3 % (ref 12.0–46.0)
Lymphs Abs: 0.9 10*3/uL (ref 0.7–4.0)
MCHC: 34.7 g/dL (ref 30.0–36.0)
MCV: 88.8 fl (ref 78.0–100.0)
Monocytes Absolute: 0.2 10*3/uL (ref 0.1–1.0)
Monocytes Relative: 5.1 % (ref 3.0–12.0)
Neutro Abs: 2.7 10*3/uL (ref 1.4–7.7)
Neutrophils Relative %: 68.2 % (ref 43.0–77.0)
Platelets: 197 10*3/uL (ref 150.0–400.0)
RBC: 4.05 Mil/uL (ref 3.87–5.11)
RDW: 13.6 % (ref 11.5–15.5)
WBC: 4 10*3/uL (ref 4.0–10.5)

## 2021-11-23 LAB — COMPREHENSIVE METABOLIC PANEL
ALT: 15 U/L (ref 0–35)
AST: 18 U/L (ref 0–37)
Albumin: 4.7 g/dL (ref 3.5–5.2)
Alkaline Phosphatase: 53 U/L (ref 39–117)
BUN: 10 mg/dL (ref 6–23)
CO2: 30 mEq/L (ref 19–32)
Calcium: 9.6 mg/dL (ref 8.4–10.5)
Chloride: 104 mEq/L (ref 96–112)
Creatinine, Ser: 0.64 mg/dL (ref 0.40–1.20)
GFR: 85.64 mL/min (ref >60.00)
Glucose, Bld: 99 mg/dL (ref 70–99)
Potassium: 4 mEq/L (ref 3.5–5.1)
Sodium: 142 mEq/L (ref 135–145)
Total Bilirubin: 0.8 mg/dL (ref 0.2–1.2)
Total Protein: 6.8 g/dL (ref 6.0–8.3)

## 2021-11-23 LAB — TSH: TSH: 2.22 u[IU]/mL (ref 0.35–5.50)

## 2021-11-23 NOTE — Telephone Encounter (Signed)
CALLED PATIENT TO INFORM OF FU APPT. WITH DR. Berline Lopes ON 01/23/22 - ARRIVAL TIME- 12:30 PM FOR A 1 PM APPT., SPOKE WITH PATIENT AND SHE IS AWARE OF THIS APPT.

## 2021-11-30 ENCOUNTER — Encounter: Payer: Self-pay | Admitting: Family Medicine

## 2021-11-30 ENCOUNTER — Ambulatory Visit (INDEPENDENT_AMBULATORY_CARE_PROVIDER_SITE_OTHER): Payer: PPO | Admitting: Family Medicine

## 2021-11-30 VITALS — BP 140/60 | HR 68 | Temp 97.8°F | Ht 64.0 in | Wt 176.2 lb

## 2021-11-30 DIAGNOSIS — Z6830 Body mass index (BMI) 30.0-30.9, adult: Secondary | ICD-10-CM

## 2021-11-30 DIAGNOSIS — R600 Localized edema: Secondary | ICD-10-CM

## 2021-11-30 DIAGNOSIS — M8580 Other specified disorders of bone density and structure, unspecified site: Secondary | ICD-10-CM | POA: Diagnosis not present

## 2021-11-30 DIAGNOSIS — E6609 Other obesity due to excess calories: Secondary | ICD-10-CM

## 2021-11-30 DIAGNOSIS — K219 Gastro-esophageal reflux disease without esophagitis: Secondary | ICD-10-CM | POA: Diagnosis not present

## 2021-11-30 DIAGNOSIS — F419 Anxiety disorder, unspecified: Secondary | ICD-10-CM

## 2021-11-30 DIAGNOSIS — I10 Essential (primary) hypertension: Secondary | ICD-10-CM | POA: Diagnosis not present

## 2021-11-30 DIAGNOSIS — Z0000 Encounter for general adult medical examination without abnormal findings: Secondary | ICD-10-CM

## 2021-11-30 DIAGNOSIS — E785 Hyperlipidemia, unspecified: Secondary | ICD-10-CM | POA: Diagnosis not present

## 2021-11-30 DIAGNOSIS — M79675 Pain in left toe(s): Secondary | ICD-10-CM | POA: Diagnosis not present

## 2021-11-30 DIAGNOSIS — F32A Depression, unspecified: Secondary | ICD-10-CM

## 2021-11-30 DIAGNOSIS — M79676 Pain in unspecified toe(s): Secondary | ICD-10-CM | POA: Insufficient documentation

## 2021-11-30 MED ORDER — LUBIPROSTONE 24 MCG PO CAPS: 24.0000 ug | ORAL_CAPSULE | Freq: Two times a day (BID) | ORAL | 3 refills | Status: DC

## 2021-11-30 MED ORDER — HYDROCHLOROTHIAZIDE 25 MG PO TABS: 25.0000 mg | ORAL_TABLET | Freq: Every day | ORAL | 3 refills | Status: DC

## 2021-11-30 MED ORDER — VENLAFAXINE HCL ER 75 MG PO CP24: 75.0000 mg | ORAL_CAPSULE | Freq: Every day | ORAL | 3 refills | Status: DC

## 2021-11-30 MED ORDER — POTASSIUM CHLORIDE ER 10 MEQ PO TBCR: 20.0000 meq | EXTENDED_RELEASE_TABLET | Freq: Two times a day (BID) | ORAL | 3 refills | Status: DC

## 2021-11-30 NOTE — Assessment & Plan Note (Signed)
Reviewed health habits including diet and exercise and skin cancer prevention Reviewed appropriate screening tests for age  Also reviewed health mt list, fam hx and immunization status , as well as social and family history   See HPI amw reviewed  Plans to check on coverage of shingrix Mammogram utd  Colonoscopy utd with recall 11/2022 dexa utd 12/2020 -nl range 2 falls /no fractures Discussed fall prevention in post polio pt

## 2021-11-30 NOTE — Assessment & Plan Note (Signed)
Takes hctz 25 mg daily  Lasix 20 mg three days weekly

## 2021-11-30 NOTE — Assessment & Plan Note (Signed)
Discussed how this problem influences overall health and the risks it imposes  Reviewed plan for weight loss with lower calorie diet (via better food choices and also portion control or program like weight watchers) and exercise building up to or more than 30 minutes 5 days per week including some aerobic activity    

## 2021-11-30 NOTE — Assessment & Plan Note (Signed)
Continues effexor xr 75 mg daily phq score of 4 Pt thinks stable Enc good self care

## 2021-11-30 NOTE — Assessment & Plan Note (Signed)
bp in fair control at this time  BP Readings from Last 1 Encounters:  11/30/21 140/60  improved on 2nd check  No changes needed Most recent labs reviewed  Disc lifstyle change with low sodium diet and exercise  Plan to continue  Amlodipine 10 mg daily  hctz 25 mg daily  toprol xl 25 mg daily  Furosemide 20 mg three days per week for edema

## 2021-11-30 NOTE — Assessment & Plan Note (Signed)
#   3rd toe looks contused in setting of hammer toe def inst to soak/ epsom salts  Wear well fitting shoes  Cushion with gauze or latex If no imp f/u with podiatry

## 2021-11-30 NOTE — Progress Notes (Signed)
Subjective:    Patient ID: Veronica Greene, female    DOB: September 09, 1945, 76 y.o.   MRN: [id number]  HPI Here for health maintenance exam and to review chronic medical problems    Wt Readings from Last 3 Encounters:  11/30/21 176 lb 4 oz (79.9 kg)  10/30/21 177 lb 4.8 oz (80.4 kg)  07/20/21 185 lb 9.6 oz (84.2 kg)   30.25 kg/m  Excited to go camping soon  Getting packed is stressful   Wt is down some   Has a lot going on lately   Knee problems- cortisone shots  Wants to avoid surgery   Is on her polio leg-worries about that  Using a cane and walker  Bad virus at the end of march and then husband got it  Sore toe in L foot    Amw done and reviewed   Immunization History  Administered Date(s) Administered   Fluad Quad(high Dose 65+) 05/06/2019, 05/04/2020   Influenza Whole 04/08/2008, 06/30/2009   Influenza, High Dose Seasonal PF 05/17/2015, 05/07/2016, 05/07/2017, 04/22/2018, 05/04/2021   Influenza-Unspecified 05/09/2014   PFIZER Comirnaty(Gray Top)Covid-19 Tri-Sucrose Vaccine 07/30/2019, 08/20/2019, 04/23/2020   PFIZER(Purple Top)SARS-COV-2 Vaccination 07/30/2019, 08/20/2019, 04/23/2020   Pneumococcal Conjugate-13 10/12/2014   Pneumococcal Polysaccharide-23 08/25/2010   Td 10/15/2003   Tdap 06/18/2018   Zoster, Live 08/12/2008   Shingrix: is interested if covered and will check on coverage   Mammogram 06/2021  Self breast exam: no lumps   Colonoscopy 11/2019 3 y recall, will be due 11/2022 Father had colon polyps/possibly cancer Paunt colon cancer    Dexa 12/2020 Bmd in normal range  Falls/fractures: 2 falls in January , no fractures  She has polio leg- got tangled up in cane /has a walker now also  Supplements - taking vit D , mvi  and chewable calcium  Exercise : yard [place] and riding stationary bike when pain is not too bad  Walking is harder now   HTN bp is stable today   (white coat always also)  No cp or palpitations or headaches or edema   No side effects to medicines  BP Readings from Last 3 Encounters:  11/30/21 (!) 150/68  10/30/21 (!) 148/64  10/06/21 (!) 156/73     Amlodipine 10 mg daily  Hctz 25 mg daily  Toprol 25 mg daily XL Furosemide 3 days per week   Cardiology care Past valve surgery   Pulse Readings from Last 3 Encounters:  11/30/21 68  10/30/21 60  10/06/21 83   Nexium 40 mg daily for GERD  Mood Taking effexor xr 75 mg daily for anxiety with depression     12/14/2020    2:19 PM 11/29/2020    9:40 AM 11/24/2019    9:07 AM 11/20/2018    8:41 AM 11/20/2017    9:43 AM  Depression screen PHQ 2/9  Decreased Interest 2 1 0 0 2  Down, Depressed, Hopeless 2 0 1 0 2  PHQ - 2 Score _0 0 4  Altered sleeping 0 0 0 0 2  Tired, decreased energy 0 2 0 0 1  Change in appetite 0 0 0 0 1  Feeling bad or failure about yourself  0 0 0 0 0  Trouble concentrating 0 0 0 0 0  Moving slowly or fidgety/restless 0 0 0 0 0  Suicidal thoughts 0 0 0 0 0  PHQ-9 Score _1 0 8  Difficult doing work/chores Not difficult at all Not difficult at  all Not difficult at all Not difficult at all   Per pt -stable     Lab Results  Component Value Date   CREATININE 0.64 11/23/2021   BUN 10 11/23/2021   NA 142 11/23/2021   K 4.0 11/23/2021   CL 104 11/23/2021   CO2 30 11/23/2021    Hyperlipidemia  Lab Results  Component Value Date   CHOL 179 11/23/2021   CHOL 170 11/25/2020   CHOL 178 11/20/2019   Lab Results  Component Value Date   HDL 48.80 11/23/2021   HDL 44.50 11/25/2020   HDL 46.00 11/20/2019   Lab Results  Component Value Date   LDLCALC 100 (H) 11/23/2021   LDLCALC 96 11/25/2020   LDLCALC 111 (H) 11/20/2019   Lab Results  Component Value Date   TRIG 153.0 (H) 11/23/2021   TRIG 148.0 11/25/2020   TRIG 107.0 11/20/2019   Lab Results  Component Value Date   CHOLHDL 4 11/23/2021   CHOLHDL 4 11/25/2020   CHOLHDL 4 11/20/2019   No results found for: LDLDIRECT She wants to avoid statin  medicines Diet is fairly low cholesterol      Lab Results  Component Value Date   ALT 15 11/23/2021   AST 18 11/23/2021   ALKPHOS 53 11/23/2021   BILITOT 0.8 11/23/2021   Lab Results  Component Value Date   WBC 4.0 11/23/2021   HGB 12.5 11/23/2021   HCT 36.0 11/23/2021   MCV 88.8 11/23/2021   PLT 197.0 11/23/2021   Lab Results  Component Value Date   TSH 2.22 11/23/2021    Patient Active Problem List   Diagnosis Date Noted   Toe pain 11/30/2021   Multiple falls 03/30/2020   Lower extremity edema 02/21/2020   S/P TAVR (transcatheter aortic valve replacement)    Poor balance 11/25/2018   History of endometrial cancer 09/12/2017   Essential hypertension 08/29/2017   Severe calcific aortic stenosis 08/27/2017   Estrogen deficiency 11/05/2016   Routine general medical examination at a health care facility 10/15/2015   Hyperlipidemia with target LDL less than 100 09/02/2012   Degenerative disk disease 08/31/2011   Personal history of colonic polyps 11/03/2010   Gout 08/25/2010   DERMATITIS, SEBORRHEIC 08/25/2010   Osteopenia 09/05/2007   History of poliomyelitis 08/26/2007   Obesity 08/26/2007   Anxiety and depression 08/26/2007   ALLERGIC RHINITIS 08/26/2007   GERD 08/26/2007   IBS 08/26/2007   OVERACTIVE BLADDER 08/26/2007   OSTEOARTHRITIS, HANDS, BILATERAL 08/26/2007   URINARY INCONTINENCE 08/26/2007   Past Medical History:  Diagnosis Date   Anxiety and depression 08/26/2007   Qualifier: Diagnosis of  By: Glori Bickers MD, Carmell Austria    Arthritis    knees   Constipation, slow transit 11/03/2010   Degenerative disk disease 08/31/2011   Depression with anxiety    DERMATITIS, SEBORRHEIC 08/25/2010   Qualifier: Diagnosis of  By: Glori Bickers MD, Carmell Austria    Endometrial cancer [facility])    02/ 2019  Diagnosed with D&C/hysteroscopy with polypectomy   Essential hypertension 08/29/2017   Fibromyalgia    tx mobic   Full dentures    GERD 08/26/2007   Qualifier: Diagnosis of  By:  Glori Bickers MD, Carmell Austria    Gout    Hearing loss    wears bilateral hearing aids   History of colon polyps    History of nonmelanoma skin cancer    right lower leg   History of poliomyelitis 08/26/2007   Qualifier: History of  By: Glori Bickers MD,  Carmell Austria    History of radiation therapy    Endometium- 11/20/17-12/11/17- Vag Cuff -Dr. Gery Pray   Hyperlipidemia, mild 09/02/2012   Hypertension    Hypokalemia 01/02/2011   IBS (irritable bowel syndrome)    with constipation   Obesity 08/26/2007   Qualifier: Diagnosis of  By: Glori Bickers MD, Carmell Austria    OSTEOARTHRITIS, HANDS, BILATERAL 08/26/2007   Qualifier: Diagnosis of  By: Glori Bickers MD, Carmell Austria    Osteopenia    OVERACTIVE BLADDER 08/26/2007   Qualifier: Diagnosis of  By: Glori Bickers MD, Carmell Austria    Personal history of colonic polyps 11/03/2010   05/2018 13 polyps mostly adenomas, ssp's - recall 1 year 2020 Gatha Mayer, MD, [facility]    Poor balance 11/25/2018   With falls  Has rise in R shoe/post polio  Weak legs as well     S/P TAVR (transcatheter aortic valve replacement)    s/p TAVR with a 43m Edwards S3U via the TF approach by Dr. BCyndia Bentand Dr. MAngelena Form  Severe calcific aortic stenosis 08/27/2017    -> Became severe as of October 2020-referred for TAVR on 04/28/2019   URINARY INCONTINENCE 08/26/2007   Qualifier: Diagnosis of  By: TGlori BickersMD, MCarmell Austria   Wears hearing aid in both ears    Past Surgical History:  Procedure Laterality Date   [place]   COLONOSCOPY N/A 06/24/2015   Procedure: COLONOSCOPY;  Surgeon: CGatha Mayer MD;  Location: [facility];  Service: Endoscopy;  Laterality: N/A;   DILATATION & CURETTAGE/HYSTEROSCOPY WITH MYOSURE N/A 08/09/2017   Procedure: DILATATION & CURETTAGE/HYSTEROSCOPY WITH MYOSURE;  Surgeon: CChristophe Louis MD;  [place];  Service: Gynecology;  Laterality: N/A;  Polypectomy   DILATION AND CURETTAGE  OF UTERUS     PMB   HAND SURGERY Bilateral 1999   Thumbs    KNEE SURGERY Bilateral 1998   x2   LYMPH NODE BIOPSY N/A 10/08/2017   Procedure: LYMPH NODE BIOPSY;  Surgeon: REveritt Amber MD;  Location: [facility];  Service: Gynecology;  Laterality: N/A;   MULTIPLE TOOTH EXTRACTIONS     NASAL SINUS SURGERY  1976   polio surgery.     right foot - right leg 1.5" shorter than left leg - some wekness   RIGHT/LEFT HEART CATH AND CORONARY ANGIOGRAPHY N/A 04/09/2019   Procedure: RIGHT/LEFT HEART CATH AND CORONARY ANGIOGRAPHY;  Surgeon: HLeonie Man MD;  [facility];  Service: Cardiovascular;; Severe AS, mean gradient 50 mmHg P-P 55 mmHg (estimated AVA 0.76 cm).  Mildly elevated pulmonary pressures with elevated LVEDP and PCWP.  Angiographically normal coronary arteries, but tortuous.   ROBOTIC ASSISTED TOTAL HYSTERECTOMY WITH BILATERAL SALPINGO OOPHERECTOMY Bilateral 10/08/2017   Procedure: XI ROBOTIC ASSISTED TOTAL HYSTERECTOMY WITH BILATERAL SALPINGO OOPHORECTOMY;  Surgeon: REveritt Amber MD;  Location: [facility];  Service: Gynecology;  Laterality: Bilateral;   TEE WITHOUT CARDIOVERSION N/A 04/28/2019   Procedure: TRANSESOPHAGEAL ECHOCARDIOGRAM (TEE);  Surgeon: MBurnell Blanks MD;  [facility];  Service: Open Heart Surgery;  Laterality: N/A;   THUMB ARTHROSCOPY  1999   TONSILLECTOMY  17138  76years old   TRANSCATHETER AORTIC VALVE REPLACEMENT, TRANSFEMORAL N/A 04/28/2019   Procedure: TRANSCATHETER AORTIC VALVE REPLACEMENT, TRANSFEMORAL;  Surgeon: MBurnell Blanks MD;  [place]; ; 23 mm Sapien 3 Ultra THV via TF approach. (post-op mean AV  gradient 13 mmHg)   TRANSTHORACIC ECHOCARDIOGRAM  05/04/2020   EF 60 to 65%.  Normal LV function and wall motion.  Indeterminate diastolic parameters.  Normal RV size and pressure.  Moderate LA dilation.  Moderate MAC but no significant MR.  Post TAVR with 23 mm Sapien 3 valve: Mean gradient 14 mmHg.  No  PVL.  Normal IVC   TRANSTHORACIC ECHOCARDIOGRAM  02/17/2019   Mod LVH. GR 2 DD. Severe AS (mean gradient = 44 mmHg with Mod AI. mild MS.   TRANSTHORACIC ECHOCARDIOGRAM  04/29/2019; 05/2020   a)  (post TAVR): EF 60-65%.  GRII DD.  Mod LA dilation & nl RA.  Mod MAC.  No  AI (no-PVL), mean AV gradient 13 mmHg.;; b) (2nd post TAVR) EF 60 to 65%.  Severely increased thickness/LVH.  GRII DD with only mildly elevated left atrial pressure.  Normal RA size.  Severe MAC with only trace MR.  No MS.  TAVR valve prosthesis functioning well.  Trivial AI.  Mean gradient 15 mmHg with a peak 29.8 mmHg   TUBAL LIGATION     UPPER GI ENDOSCOPY     normal   WISDOM TOOTH EXTRACTION     Social History   Tobacco Use   Smoking status: Never   Smokeless tobacco: Never  Vaping Use   Vaping Use: Never used  Substance Use Topics   Alcohol use: No    Alcohol/week: 0.0 standard drinks   Drug use: No   Family History  Problem Relation Age of Onset   Kidney disease Mother    Alzheimer's disease Mother    Stroke Father 15   Colon cancer Father 72       possibly colon cancer, possibly just polyps   Kidney disease Sister    Heart attack Sister    Colon cancer Paternal Aunt 19   Cancer Paternal Uncle        unk type   Esophageal cancer Neg Hx    Stomach cancer Neg Hx    Rectal cancer Neg Hx    Allergies  Allergen Reactions   Clarithromycin Swelling and Other (See Comments)    Throat Swells   Penicillins Hives and Other (See Comments)    WELTS Has patient had a PCN reaction causing immediate rash, facial/tongue/throat swelling, SOB or lightheadedness with hypotension: No Has patient had a PCN reaction causing severe rash involving mucus membranes or skin necrosis: Unknown Has patient had a PCN reaction that required hospitalization:No Has patient had a PCN reaction occurring within the last 10 years: No If all of the above answers are "NO", then may proceed with Cephalosporin use.    Codeine Nausea And  Vomiting   Current Outpatient Medications on File Prior to Visit  Medication Sig Dispense Refill   acetaminophen (TYLENOL) 325 MG tablet Take 650 mg by mouth every 6 (six) hours as needed (for pain.).     acetic acid-hydrocortisone (VOSOL-HC) OTIC solution Place 2 drops into both ears 2 (two) times daily as needed (itching).     Ascorbic Acid (VITAMIN C) 1000 MG tablet Take 1,000 mg by mouth daily with breakfast.     aspirin EC 81 MG tablet Take 81 mg by mouth daily.     Black Pepper-Turmeric (TURMERIC COMPLEX/BLACK PEPPER PO) Take 15 mLs by mouth daily with breakfast.      Calcium-Phosphorus-Vitamin D (CALCIUM/VITAMIN D3/ADULT GUMMY PO) Take 1 tablet by mouth 2 (two) times daily.      cetirizine (ZYRTEC) 10 MG tablet Take 5  mg by mouth as needed for allergies.     cholecalciferol (VITAMIN D) 1000 UNITS tablet Take 1,000 Units by mouth daily with breakfast.      D 1000 25 MCG (1000 UT) capsule 1 Add'l Sig oral Select Frequency     fluticasone (FLONASE) 50 MCG/ACT nasal spray Place 2 sprays into both nostrils at bedtime.      furosemide (LASIX) 20 MG tablet MAY TAKE 1 TABLET (20 MG TOTAL) UP TO THREE TIMES A WEEK. DO NOT TAKE HCTZ THAT DAY. 30 tablet 11   GUAIFENESIN 1200 PO Take 1,200 mg by mouth at bedtime.      meclizine (ANTIVERT) 25 MG tablet Take 0.5-1 tablets (12.5-25 mg total) by mouth 3 (three) times daily as needed for dizziness. Caution of sedation 20 tablet 0   meloxicam (MOBIC) 7.5 MG tablet TAKE 1 TABLET BY MOUTH AT BEDTIME. 90 tablet 3   metoprolol succinate (TOPROL-XL) 25 MG 24 hr tablet TAKE 1 TABLET BY MOUTH DAILY. 90 tablet 3   Multiple Vitamin (MULTIVITAMIN WITH MINERALS) TABS tablet Take 1 tablet by mouth daily.      NEXIUM 40 MG capsule 1 Add'l Sig oral Select Frequency     ondansetron (ZOFRAN-ODT) 4 MG disintegrating tablet Take 1 tablet (4 mg total) by mouth every 8 (eight) hours as needed. 20 tablet 0   Polyethyl Glycol-Propyl Glycol 0.4-0.3 % SOLN Place 1-2 drops into  both eyes 2 (two) times daily.      Simethicone (PHAZYME PO) Take 1 tablet by mouth at bedtime as needed (for gas).      sodium chloride (OCEAN) 0.65 % nasal spray Place 1 spray into the nose daily.     triamcinolone (KENALOG) 0.1 % Apply topically.     amLODipine (NORVASC) 10 MG tablet Take 1 tablet (10 mg total) by mouth daily. 180 tablet 3   No current facility-administered medications on file prior to visit.     Review of Systems  Constitutional:  Negative for activity change, appetite change, fatigue, fever and unexpected weight change.  HENT:  Negative for congestion, ear pain, rhinorrhea, sinus pressure and sore throat.   Eyes:  Negative for pain, redness and visual disturbance.  Respiratory:  Negative for cough, shortness of breath and wheezing.   Cardiovascular:  Negative for chest pain and palpitations.  Gastrointestinal:  Negative for abdominal pain, blood in stool, constipation and diarrhea.  Endocrine: Negative for polydipsia and polyuria.  Genitourinary:  Negative for dysuria, frequency and urgency.  Musculoskeletal:  Positive for arthralgias and back pain. Negative for myalgias.  Skin:  Negative for pallor and rash.  Allergic/Immunologic: Negative for environmental allergies.  Neurological:  Negative for dizziness, syncope and headaches.  Hematological:  Negative for adenopathy. Does not bruise/bleed easily.  Psychiatric/Behavioral:  Negative for decreased concentration and dysphoric mood. The patient is not nervous/anxious.       Objective:   Physical Exam Constitutional:      General: She is not in acute distress.    Appearance: Normal appearance. She is well-developed. She is obese. She is not ill-appearing or diaphoretic.  HENT:     Head: Normocephalic and atraumatic.     Right Ear: Tympanic membrane, ear canal and external ear normal.     Left Ear: Tympanic membrane, ear canal and external ear normal.     Nose: Nose normal. No congestion.     Mouth/Throat:      Mouth: Mucous membranes are moist.     Pharynx: Oropharynx is clear. No posterior  oropharyngeal erythema.  Eyes:     General: No scleral icterus.    Extraocular Movements: Extraocular movements intact.     Conjunctiva/sclera: Conjunctivae normal.     Pupils: Pupils are equal, round, and reactive to light.  Neck:     Thyroid: No thyromegaly.     Vascular: No carotid bruit or JVD.  Cardiovascular:     Rate and Rhythm: Normal rate and regular rhythm.     Pulses: Normal pulses.     Heart sounds: Normal heart sounds.    No gallop.  Pulmonary:     Effort: Pulmonary effort is normal. No respiratory distress.     Breath sounds: Normal breath sounds. No wheezing.     Comments: Good air exch Chest:     Chest wall: No tenderness.  Abdominal:     General: Bowel sounds are normal. There is no distension or abdominal bruit.     Palpations: Abdomen is soft. There is no mass.     Tenderness: There is no abdominal tenderness.     Hernia: No hernia is present.  Genitourinary:    Comments: Breast exam: No mass, nodules, thickening, tenderness, bulging, retraction, inflamation, nipple discharge or skin changes noted.  No axillary or clavicular LA.     Musculoskeletal:        General: No tenderness. Normal range of motion.     Cervical back: Normal range of motion and neck supple. No rigidity. No muscular tenderness.     Right lower leg: No edema.     Left lower leg: No edema.     Comments: No kyphosis   Baseline R leg/foot deformity from polio Hammertoes  L 3rd toe has erythema /looks contused  Lymphadenopathy:     Cervical: No cervical adenopathy.  Skin:    General: Skin is warm and dry.     Coloration: Skin is not pale.     Findings: No erythema or rash.     Comments: Solar lentigines diffusely  Sks scattered  Neurological:     Mental Status: She is alert. Mental status is at baseline.     Cranial Nerves: No cranial nerve deficit.     Motor: No abnormal muscle tone.      Coordination: Coordination normal.     Gait: Gait normal.     Deep Tendon Reflexes: Reflexes are normal and symmetric. Reflexes normal.  Psychiatric:        Mood and Affect: Mood normal.        Cognition and Memory: Cognition and memory normal.          Assessment & Plan:   Problem List Items Addressed This Visit       Cardiovascular and Mediastinum   Essential hypertension (Chronic)    bp in fair control at this time  BP Readings from Last 1 Encounters:  11/30/21 140/60  improved on 2nd check  No changes needed Most recent labs reviewed  Disc lifstyle change with low sodium diet and exercise  Plan to continue  Amlodipine 10 mg daily  hctz 25 mg daily  toprol xl 25 mg daily  Furosemide 20 mg three days per week for edema      Relevant Medications   hydrochlorothiazide (HYDRODIURIL) 25 MG tablet     Digestive   GERD    Continues nexium Had exam from ENT-no vocal cord abn noted       Relevant Medications   lubiprostone (AMITIZA) 24 MCG capsule     Musculoskeletal and Integument  Osteopenia     Other   Hyperlipidemia with target LDL less than 100 (Chronic)    Disc goals for lipids and reasons to control them Rev last labs with pt Rev low sat fat diet in detail  LDL of 100  Wants to avoid statin medications      Relevant Medications   hydrochlorothiazide (HYDRODIURIL) 25 MG tablet   Lower extremity edema (Chronic)    Takes hctz 25 mg daily  Lasix 20 mg three days weekly       Obesity (Chronic)    Discussed how this problem influences overall health and the risks it imposes  Reviewed plan for weight loss with lower calorie diet (via better food choices and also portion control or program like weight watchers) and exercise building up to or more than 30 minutes 5 days per week including some aerobic activity          Anxiety and depression    Continues effexor xr 75 mg daily phq score of 4 Pt thinks stable Enc good self care       Relevant  Medications   venlafaxine XR (EFFEXOR XR) 75 MG 24 hr capsule   Routine general medical examination at a health care facility - Primary    Reviewed health habits including diet and exercise and skin cancer prevention Reviewed appropriate screening tests for age  Also reviewed health mt list, fam hx and immunization status , as well as social and family history   See HPI amw reviewed  Plans to check on coverage of shingrix Mammogram utd  Colonoscopy utd with recall 11/2022 dexa utd 12/2020 -nl range 2 falls /no fractures Discussed fall prevention in post polio pt        Toe pain    # 3rd toe looks contused in setting of hammer toe def inst to soak/ epsom salts  Wear well fitting shoes  Cushion with gauze or latex If no imp f/u with podiatry

## 2021-11-30 NOTE — Patient Instructions (Addendum)
If you are interested in the new shingles vaccine (Shingrix) - call your local pharmacy to check on coverage and availability  If affordable, get on a wait list at your pharmacy to get the vaccine.   Take care of yourself  Blood pressure was better on the 2nd check    Soak your left foot and watch your toe for increased pain or redness Elevate foot when you can Cushion to protect the toe from friction

## 2021-11-30 NOTE — Assessment & Plan Note (Signed)
Continues nexium Had exam from ENT-no vocal cord abn noted

## 2021-11-30 NOTE — Assessment & Plan Note (Signed)
Disc goals for lipids and reasons to control them Rev last labs with pt Rev low sat fat diet in detail  LDL of 100  Wants to avoid statin medications

## 2021-12-15 ENCOUNTER — Ambulatory Visit (INDEPENDENT_AMBULATORY_CARE_PROVIDER_SITE_OTHER): Payer: PPO

## 2021-12-15 VITALS — Wt 176.0 lb

## 2021-12-15 DIAGNOSIS — Z0000 Encounter for general adult medical examination without abnormal findings: Secondary | ICD-10-CM

## 2021-12-15 NOTE — Progress Notes (Signed)
Virtual Visit via Telephone Note  I connected with  Veronica Greene on 12/15/21 at 10:30 AM EDT by telephone and verified that I am speaking with the correct person using two identifiers.  Location: Patient: home Provider: [place] Persons participating in the virtual visit: [place]   I discussed the limitations, risks, security and privacy concerns of performing an evaluation and management service by telephone and the availability of in person appointments. The patient expressed understanding and agreed to proceed.  Interactive audio and video telecommunications were attempted between this nurse and patient, however failed, due to patient having technical difficulties OR patient did not have access to video capability.  We continued and completed visit with audio only.  Some vital signs may be absent or patient reported.   Dionisio David, LPN  Subjective:   Veronica Greene is a 76 y.o. female who presents for Medicare Annual (Subsequent) preventive examination.  Review of Systems     Cardiac Risk Factors include: advanced age (>25mn, >>84women);hypertension     Objective:    Today's Vitals   12/15/21 1039  Weight: 176 lb (79.8 kg)   Body mass index is 30.21 kg/m.     12/15/2021   10:29 AM 07/20/2021    1:53 PM 12/14/2020    2:18 PM 06/23/2020   10:36 AM 11/24/2019    9:02 AM 09/21/2019   11:11 AM 06/25/2019    3:06 PM  Advanced Directives  Does Patient Have a Medical Advance Directive? No _0  Yes  Type of AScientist, physiologicalof [place]ExtonLiving will [place]Living will HV[place] AFBLiving will HCorsonLiving will   Does patient want to make changes to medical advance directive?  No - Patient declined       Copy of [place] Chart?   No - copy requested No - copy requested No - copy requested No - copy requested   Would  patient like information on creating a medical advance directive? No - Patient declined          Current Medications (verified) Outpatient Encounter Medications as of 12/15/2021  Medication Sig   acetaminophen (TYLENOL) 325 MG tablet Take 650 mg by mouth every 6 (six) hours as needed (for pain.).   acetic acid-hydrocortisone (VOSOL-HC) OTIC solution Place 2 drops into both ears 2 (two) times daily as needed (itching).   amLODipine (NORVASC) 5 MG tablet Take 1 tablet by mouth daily.   Ascorbic Acid (VITAMIN C) 1000 MG tablet Take 1,000 mg by mouth daily with breakfast.   aspirin EC 81 MG tablet Take 81 mg by mouth daily.   Black Pepper-Turmeric (TURMERIC COMPLEX/BLACK PEPPER PO) Take 15 mLs by mouth daily with breakfast.    Calcium-Phosphorus-Vitamin D (CALCIUM/VITAMIN D3/ADULT GUMMY PO) Take 1 tablet by mouth 2 (two) times daily.    cetirizine (ZYRTEC) 10 MG tablet Take 5 mg by mouth as needed for allergies.   cholecalciferol (VITAMIN D) 1000 UNITS tablet Take 1,000 Units by mouth daily with breakfast.    D 1000 25 MCG (1000 UT) capsule 1 Add'l Sig oral Select Frequency   fluticasone (FLONASE) 50 MCG/ACT nasal spray Place 2 sprays into both nostrils at bedtime.    furosemide (LASIX) 20 MG tablet MAY TAKE 1 TABLET (20 MG TOTAL) UP TO THREE TIMES A WEEK. DO NOT TAKE HCTZ THAT DAY.   GUAIFENESIN 1200 PO Take 1,200 mg by mouth  at bedtime.    hydrochlorothiazide (HYDRODIURIL) 25 MG tablet Take 1 tablet (25 mg total) by mouth daily with lunch.   lubiprostone (AMITIZA) 24 MCG capsule Take 1 capsule (24 mcg total) by mouth 2 (two) times daily. LUNCH & SUPPER   lubiprostone (AMITIZA) 24 MCG capsule SMARTSIG:Capsule(s) By Mouth   meclizine (ANTIVERT) 25 MG tablet Take 0.5-1 tablets (12.5-25 mg total) by mouth 3 (three) times daily as needed for dizziness. Caution of sedation   meloxicam (MOBIC) 7.5 MG tablet TAKE 1 TABLET BY MOUTH AT BEDTIME.   metoprolol succinate (TOPROL-XL) 25 MG 24 hr tablet TAKE 1  TABLET BY MOUTH DAILY.   Multiple Vitamin (MULTIVITAMIN WITH MINERALS) TABS tablet Take 1 tablet by mouth daily.    NEXIUM 40 MG capsule 1 Add'l Sig oral Select Frequency   ondansetron (ZOFRAN-ODT) 4 MG disintegrating tablet Take 1 tablet (4 mg total) by mouth every 8 (eight) hours as needed.   Polyethyl Glycol-Propyl Glycol 0.4-0.3 % SOLN Place 1-2 drops into both eyes 2 (two) times daily.    potassium chloride (KLOR-CON) 10 MEQ tablet Take 2 tablets (20 mEq total) by mouth 2 (two) times daily.   Simethicone (PHAZYME PO) Take 1 tablet by mouth at bedtime as needed (for gas).    sodium chloride (OCEAN) 0.65 % nasal spray Place 1 spray into the nose daily.   triamcinolone (KENALOG) 0.1 % Apply topically.   venlafaxine XR (EFFEXOR XR) 75 MG 24 hr capsule Take 1 capsule (75 mg total) by mouth daily after breakfast.   amLODipine (NORVASC) 10 MG tablet Take 1 tablet (10 mg total) by mouth daily.   No facility-administered encounter medications on file as of 12/15/2021.    Allergies (verified) Clarithromycin, Penicillins, and Codeine   History: Past Medical History:  Diagnosis Date   Anxiety and depression 08/26/2007   Qualifier: Diagnosis of  By: Glori Bickers MD, Carmell Austria    Arthritis    knees   Constipation, slow transit 11/03/2010   Degenerative disk disease 08/31/2011   Depression with anxiety    DERMATITIS, SEBORRHEIC 08/25/2010   Qualifier: Diagnosis of  By: Glori Bickers MD, Carmell Austria    Endometrial cancer [facility])    02/ 2019  Diagnosed with D&C/hysteroscopy with polypectomy   Essential hypertension 08/29/2017   Fibromyalgia    tx mobic   Full dentures    GERD 08/26/2007   Qualifier: Diagnosis of  By: Glori Bickers MD, Carmell Austria    Gout    Hearing loss    wears bilateral hearing aids   History of colon polyps    History of nonmelanoma skin cancer    right lower leg   History of poliomyelitis 08/26/2007   Qualifier: History of  By: Glori Bickers MD, Carmell Austria    History of radiation therapy     Endometium- 11/20/17-12/11/17- Vag Cuff -Dr. Gery Pray   Hyperlipidemia, mild 09/02/2012   Hypertension    Hypokalemia 01/02/2011   IBS (irritable bowel syndrome)    with constipation   Obesity 08/26/2007   Qualifier: Diagnosis of  By: Glori Bickers MD, Carmell Austria    OSTEOARTHRITIS, HANDS, BILATERAL 08/26/2007   Qualifier: Diagnosis of  By: Glori Bickers MD, Carmell Austria    Osteopenia    OVERACTIVE BLADDER 08/26/2007   Qualifier: Diagnosis of  By: Glori Bickers MD, Carmell Austria    Personal history of colonic polyps 11/03/2010   05/2018 13 polyps mostly adenomas, ssp's - recall 1 year 2020 Gatha Mayer, MD, [facility]    Poor balance 11/25/2018  With falls  Has rise in R shoe/post polio  Weak legs as well     S/P TAVR (transcatheter aortic valve replacement)    s/p TAVR with a 3m Edwards S3U via the TF approach by Dr. BCyndia Bentand Dr. MAngelena Form  Severe calcific aortic stenosis 08/27/2017    -> Became severe as of October 2020-referred for TAVR on 04/28/2019   URINARY INCONTINENCE 08/26/2007   Qualifier: Diagnosis of  By: TGlori BickersMD, MCarmell Austria   Wears hearing aid in both ears    Past Surgical History:  Procedure Laterality Date   ANKLE SURGERY Right [place] Right 11/16/2019   COLONOSCOPY N/A 06/24/2015   Procedure: COLONOSCOPY;  Surgeon: CGatha Mayer MD;  Location: [facility];  Service: Endoscopy;  Laterality: N/A;   DILATATION & CURETTAGE/HYSTEROSCOPY WITH MYOSURE N/A 08/09/2017   Procedure: DILATATION & CURETTAGE/HYSTEROSCOPY WITH MYOSURE;  Surgeon: CChristophe Louis MD;  [facility];  Service: Gynecology;  Laterality: N/A;  Polypectomy   DILATION AND CURETTAGE OF UTERUS     PMB   HAND SURGERY Bilateral 1999   Thumbs    KNEE SURGERY Bilateral 1998   x2   LYMPH NODE BIOPSY N/A 10/08/2017   Procedure: LYMPH NODE BIOPSY;  Surgeon: REveritt Amber MD;  Location: [facility];  Service: Gynecology;  Laterality: N/A;   MULTIPLE TOOTH EXTRACTIONS     NASAL  SINUS SURGERY  1976   polio surgery.     right foot - right leg 1.5" shorter than left leg - some wekness   RIGHT/LEFT HEART CATH AND CORONARY ANGIOGRAPHY N/A 04/09/2019   Procedure: RIGHT/LEFT HEART CATH AND CORONARY ANGIOGRAPHY;  Surgeon: HLeonie Man MD;  [facility];  Service: Cardiovascular;; Severe AS, mean gradient 50 mmHg P-P 55 mmHg (estimated AVA 0.76 cm).  Mildly elevated pulmonary pressures with elevated LVEDP and PCWP.  Angiographically normal coronary arteries, but tortuous.   ROBOTIC ASSISTED TOTAL HYSTERECTOMY WITH BILATERAL SALPINGO OOPHERECTOMY Bilateral 10/08/2017   Procedure: XI ROBOTIC ASSISTED TOTAL HYSTERECTOMY WITH BILATERAL SALPINGO OOPHORECTOMY;  Surgeon: REveritt Amber MD;  Location: [facility];  Service: Gynecology;  Laterality: Bilateral;   TEE WITHOUT CARDIOVERSION N/A 04/28/2019   Procedure: TRANSESOPHAGEAL ECHOCARDIOGRAM (TEE);  Surgeon: MBurnell Blanks MD;  [facility];  Service: Open Heart Surgery;  Laterality: N/A;   THUMB ARTHROSCOPY  1999   TONSILLECTOMY  11371  76years old   TRANSCATHETER AORTIC VALVE REPLACEMENT, TRANSFEMORAL N/A 04/28/2019   Procedure: TRANSCATHETER AORTIC VALVE REPLACEMENT, TRANSFEMORAL;  Surgeon: MBurnell Blanks MD;  [facility]; ; 23 mm Sapien 3 Ultra THV via TF approach. (post-op mean AV gradient 13 mmHg)   TRANSTHORACIC ECHOCARDIOGRAM  05/04/2020   EF 60 to 65%.  Normal LV function and wall motion.  Indeterminate diastolic parameters.  Normal RV size and pressure.  Moderate LA dilation.  Moderate MAC but no significant MR.  Post TAVR with 23 mm Sapien 3 valve: Mean gradient 14 mmHg.  No PVL.  Normal IVC   TRANSTHORACIC ECHOCARDIOGRAM  02/17/2019   Mod LVH. GR 2 DD. Severe AS (mean gradient = 44 mmHg with Mod AI. mild MS.   TRANSTHORACIC ECHOCARDIOGRAM  04/29/2019; 05/2020   a)  (post TAVR): EF 60-65%.  GRII DD.  Mod LA dilation & nl RA.  Mod MAC.  No  AI (no-PVL), mean AV  gradient 13 mmHg.;; b) (2nd post TAVR) EF 60  to 65%.  Severely increased thickness/LVH.  GRII DD with only mildly elevated left atrial pressure.  Normal RA size.  Severe MAC with only trace MR.  No MS.  TAVR valve prosthesis functioning well.  Trivial AI.  Mean gradient 15 mmHg with a peak 29.8 mmHg   TUBAL LIGATION     UPPER GI ENDOSCOPY     normal   WISDOM TOOTH EXTRACTION     Family History  Problem Relation Age of Onset   Kidney disease Mother    Alzheimer's disease Mother    Stroke Father 17   Colon cancer Father 47       possibly colon cancer, possibly just polyps   Kidney disease Sister    Heart attack Sister    Colon cancer Paternal Aunt 32   Cancer Paternal Uncle        unk type   Esophageal cancer Neg Hx    Stomach cancer Neg Hx    Rectal cancer Neg Hx    Social History   Socioeconomic History   Marital status: Married    Spouse name: Not on file   Number of children: 2   Years of education: Not on file   Highest education level: High school graduate  Occupational History   Occupation: RECEPTION    Employer: [employer]    Comment: Retired; Soil scientist reception  Tobacco Use   Smoking status: Never   Smokeless tobacco: Never  Vaping Use   Vaping Use: Never used  Substance and Sexual Activity   Alcohol use: No    Alcohol/week: 0.0 standard drinks of alcohol   Drug use: No   Sexual activity: Yes    Birth control/protection: Post-menopausal  Other Topics Concern   Not on file  Social History Narrative   She is relatively recently remarried.  She has 2  children and 3 grandchildren.     She is currently retired.  But enjoys cleaning house and doing chores.  She likes to do yard work.  Does not routinely exercise.   Social Determinants of Health   Financial Resource Strain: Low Risk  (12/15/2021)   Overall Financial Resource Strain (CARDIA)    Difficulty of Paying Living Expenses: Not hard at all  Food Insecurity: No Food Insecurity (12/15/2021)   Hunger  Vital Sign    Worried About Running Out of Food in the Last Year: Never true    Ran Out of Food in the Last Year: Never true  Transportation Needs: No Transportation Needs (12/15/2021)   PRAPARE - Hydrologist (Medical): No    Lack of Transportation (Non-Medical): No  Physical Activity: Insufficiently Active (12/15/2021)   Exercise Vital Sign    Days of Exercise per Week: 3 days    Minutes of Exercise per Session: 30 min  Stress: No Stress Concern Present (12/15/2021)   [place]    Feeling of Stress : Only a little  Social Connections: Moderately Integrated (12/15/2021)   Social Connection and Isolation Panel [NHANES]    Frequency of Communication with Friends and Family: More than three times a week    Frequency of Social Gatherings with Friends and Family: Once a week    Attends Religious Services: More than 4 times per year    Active Member of [employer] or Organizations: No    Attends Archivist Meetings: Never    Marital Status: Married    Tobacco Counseling Counseling given: Not  Answered   Clinical Intake:  Pre-visit preparation completed: Yes        Nutritional Risks: None Diabetes: No  How often do you need to have someone help you when you read instructions, pamphlets, or other written materials from your doctor or pharmacy?: 1 - Never  Diabetic?no  Interpreter Needed?: No  Information entered by :: Kirke Shaggy, LPN   Activities of Daily Living    12/15/2021   10:31 AM  In your present state of health, do you have any difficulty performing the following activities:  Hearing? 0  Vision? 0  Difficulty concentrating or making decisions? 0  Walking or climbing stairs? 1  Dressing or bathing? 0  Doing errands, shopping? 0  Preparing Food and eating ? N  Using the Toilet? N  In the past six months, have you accidently leaked urine? N  Do you have problems with  loss of bowel control? N  Managing your Medications? N  Managing your Finances? N  Housekeeping or managing your Housekeeping? N    Patient Care Team: Tower, [name], MD as PCP - General Ellyn Hack Leonie Green, MD as PCP - Cardiology (Cardiology) Regal, Tamala Fothergill, DPM as Consulting Physician (Podiatry) Ninetta Lights, MD (Inactive) as Consulting Physician (Orthopedic Surgery) Jerrell Belfast, MD as Consulting Physician (Otolaryngology) Lavella Hammock, DDS as Consulting Physician (Dentistry) Macarthur Critchley, OD as Referring Physician (Optometry)  Indicate any recent Medical Services you may have received from other than [facility] providers in the past year (date may be approximate).     Assessment:   This is a routine wellness examination for Veronica Greene.  Hearing/Vision screen Hearing Screening - Comments:: Wears aids Vision Screening - Comments:: Readers- Dr.Scott  Dietary issues and exercise activities discussed: Current Exercise Habits: Home exercise routine, Type of exercise: walking, Time (Minutes): 30, Frequency (Times/Week): 3, Weekly Exercise (Minutes/Week): 90, Intensity: Mild   Goals Addressed             This Visit's Progress    DIET - EAT MORE FRUITS AND VEGETABLES         Depression Screen    12/15/2021   10:26 AM 12/14/2020    2:19 PM 11/29/2020    9:40 AM 11/24/2019    9:07 AM 11/20/2018    8:41 AM 11/20/2017    9:43 AM 11/07/2017    8:50 AM  PHQ 2/9 Scores  PHQ - 2 Score 0 _0 0 4 4  PHQ- 9 Score 0 _1 0 8 10    Fall Risk    12/15/2021   10:30 AM 12/14/2020    2:05 PM 11/29/2020    9:38 AM 11/24/2019    9:03 AM 11/20/2018    8:41 AM  Fall Risk   Falls in the past year? _2 Comment    tripped on deck Several falls due to losing balance; soreness   Number falls in past yr: 0 0 1 0 1  Injury with Fall? 1 0 0 0 1  Risk for fall due to : History of fall(s) Medication side effect  Medication side effect;Impaired balance/gait History of fall(s);Impaired balance/gait   Follow up Falls evaluation completed;Falls prevention discussed Falls evaluation completed;Falls prevention discussed  Falls evaluation completed;Falls prevention discussed     FALL RISK PREVENTION PERTAINING TO THE HOME:  Any stairs in or around the home? No  If so, are there any without handrails? No  Home free of loose throw rugs in walkways, pet beds,  electrical cords, etc? Yes  Adequate lighting in your home to reduce risk of falls? Yes   ASSISTIVE DEVICES UTILIZED TO PREVENT FALLS:  Life alert? No  Use of a cane, walker or w/c? Yes  Grab bars in the bathroom? No  Shower chair or bench in shower? Yes  Elevated toilet seat or a handicapped toilet? Yes     Cognitive Function:       12/14/2020    2:21 PM 11/24/2019    9:14 AM 11/20/2018    8:41 AM 11/07/2017    8:28 AM 11/02/2016   10:15 AM  MMSE - Mini Mental State Exam  Orientation to time _0 Orientation to Place _1 Registration _2 Attention/ Calculation 5 5 0 0 0  Recall _3 Recall-comments   unable to recall 1 of 3 words    Language- name 2 objects   0 0 0  Language- repeat _4 Language- follow 3 step command   0 3 3  Language- read & follow direction   0 0 0  Write a sentence   0 0 0  Copy design   0 0 0  Total score   _5 12/15/2021   10:33 AM  6CIT Screen  What Year? 0 points  What month? 0 points  What time? 0 points  Count back from 20 0 points  Months in reverse 0 points  Repeat phrase 0 points  Total Score 0 points    Immunizations Immunization History  Administered Date(s) Administered   Fluad Quad(high Dose 65+) 05/06/2019, 05/04/2020   Influenza Split 04/08/2008, 06/30/2009   Influenza Whole 04/08/2008, 06/30/2009   Influenza, High Dose Seasonal PF 05/17/2015, 05/07/2016, 05/07/2017, 04/22/2018, 05/06/2019, 05/04/2020, 05/04/2021   Influenza-Unspecified 05/09/2014, 05/17/2015, 05/07/2016, 05/07/2017, 04/22/2018, 05/04/2021   PFIZER  Comirnaty(Gray Top)Covid-19 Tri-Sucrose Vaccine 07/30/2019, 08/20/2019, 04/23/2020   PFIZER(Purple Top)SARS-COV-2 Vaccination 07/30/2019, 08/20/2019, 04/23/2020   Pneumococcal Conjugate-13 10/12/2014   Pneumococcal Polysaccharide-23 08/25/2010   Pneumococcal-Unspecified 08/25/2010   Td 10/15/2003   Td,absorbed, Preservative Free, Adult Use, Lf Unspecified 10/15/2003   Tdap 06/18/2018   Zoster, Live 08/12/2008    TDAP status: Up to date  Flu Vaccine status: Up to date  Pneumococcal vaccine status: Up to date  Covid-19 vaccine status: Completed vaccines  Qualifies for Shingles Vaccine? Yes   Zostavax completed Yes   Shingrix Completed?: No.    Education has been provided regarding the importance of this vaccine. Patient has been advised to call insurance company to determine out of pocket expense if they have not yet received this vaccine. Advised may also receive vaccine at local pharmacy or Health Dept. Verbalized acceptance and understanding.  Screening Tests Health Maintenance  Topic Date Due   Zoster Vaccines- Shingrix (1 of 2) Never done   COVID-19 Vaccine (6 - Booster for Pfizer series) 06/18/2020   INFLUENZA VACCINE  02/06/2022   MAMMOGRAM  06/29/2022   COLONOSCOPY (Pts 45-30yr Insurance coverage will need to be confirmed)  11/10/2022   TETANUS/TDAP  06/18/2028   Pneumonia Vaccine 76 Years old  Completed   DEXA SCAN  Completed   Hepatitis C Screening  Completed   HPV VACCINES  Aged Out    Health Maintenance  Health Maintenance Due  Topic Date Due   Zoster Vaccines- Shingrix (1 of 2) Never done   COVID-19 Vaccine (6 -  Booster for [employer] series) 06/18/2020    Colorectal cancer screening: Type of screening: Colonoscopy. Completed 11/10/19. Repeat every 3 years  Mammogram status: Completed 06/29/21. Repeat every year  Bone Density status: Completed 12/21/20. Results reflect: Bone density results: NORMAL. Repeat every 5 years.  Lung Cancer Screening: (Low Dose CT  Chest recommended if Age 56-80 years, 30 pack-year currently smoking OR have quit w/in 15years.) does not qualify.    Additional Screening:  Hepatitis C Screening: does qualify; Completed 11/02/16  Vision Screening: Recommended annual ophthalmology exams for early detection of glaucoma and other disorders of the eye. Is the patient up to date with their annual eye exam?  Yes  Who is the provider or what is the name of the office in which the patient attends annual eye exams? Dr.Scott If pt is not established with a provider, would they like to be referred to a provider to establish care? No .   Dental Screening: Recommended annual dental exams for proper oral hygiene  Community Resource Referral / Chronic Care Management: CRR required this visit?  No   CCM required this visit?  No      Plan:     I have personally reviewed and noted the following in the patient's chart:   Medical and social history Use of alcohol, tobacco or illicit drugs  Current medications and supplements including opioid prescriptions.  Functional ability and status Nutritional status Physical activity Advanced directives List of other physicians Hospitalizations, surgeries, and ER visits in previous 12 months Vitals Screenings to include cognitive, depression, and falls Referrals and appointments  In addition, I have reviewed and discussed with patient certain preventive protocols, quality metrics, and best practice recommendations. A written personalized care plan for preventive services as well as general preventive health recommendations were provided to patient.     Dionisio David, LPN   0/01/8674   Nurse Notes: none

## 2021-12-15 NOTE — Patient Instructions (Signed)
Veronica Greene , Thank you for taking time to come for your Medicare Wellness Visit. I appreciate your ongoing commitment to your health goals. Please review the following plan we discussed and let me know if I can assist you in the future.   Screening recommendations/referrals: Colonoscopy: 11/10/19 Mammogram: 06/29/21 Bone Density: 12/21/20 Recommended yearly ophthalmology/optometry visit for glaucoma screening and checkup Recommended yearly dental visit for hygiene and checkup  Vaccinations: Influenza vaccine: 05/04/21 Pneumococcal vaccine: 10/12/14 Tdap vaccine: 06/18/18 Shingles vaccine: Zostavax 08/12/08   Covid-19:07/30/19, 08/20/19, 04/23/20  Advanced directives: no  Conditions/risks identified: none  Next appointment: Follow up in one year for your annual wellness visit 12/18/22 @ 12:15 pm by phone   Preventive Care 65 Years and Older, Female Preventive care refers to lifestyle choices and visits with your health care provider that can promote health and wellness. What does preventive care include? A yearly physical exam. This is also called an annual well check. Dental exams once or twice a year. Routine eye exams. Ask your health care provider how often you should have your eyes checked. Personal lifestyle choices, including: Daily care of your teeth and gums. Regular physical activity. Eating a healthy diet. Avoiding tobacco and drug use. Limiting alcohol use. Practicing safe sex. Taking low-dose aspirin every day. Taking vitamin and mineral supplements as recommended by your health care provider. What happens during an annual well check? The services and screenings done by your health care provider during your annual well check will depend on your age, overall health, lifestyle risk factors, and family history of disease. Counseling  Your health care provider may ask you questions about your: Alcohol use. Tobacco use. Drug use. Emotional well-being. Home and  relationship well-being. Sexual activity. Eating habits. History of falls. Memory and ability to understand (cognition). Work and work Statistician. Reproductive health. Screening  You may have the following tests or measurements: Height, weight, and BMI. Blood pressure. Lipid and cholesterol levels. These may be checked every 5 years, or more frequently if you are over 65 years old. Skin check. Lung cancer screening. You may have this screening every year starting at age 35 if you have a 30-pack-year history of smoking and currently smoke or have quit within the past 15 years. Fecal occult blood test (FOBT) of the stool. You may have this test every year starting at age 81. Flexible sigmoidoscopy or colonoscopy. You may have a sigmoidoscopy every 5 years or a colonoscopy every 10 years starting at age 50. Hepatitis C blood test. Hepatitis B blood test. Sexually transmitted disease (STD) testing. Diabetes screening. This is done by checking your blood sugar (glucose) after you have not eaten for a while (fasting). You may have this done every 1-3 years. Bone density scan. This is done to screen for osteoporosis. You may have this done starting at age 67. Mammogram. This may be done every 1-2 years. Talk to your health care provider about how often you should have regular mammograms. Talk with your health care provider about your test results, treatment options, and if necessary, the need for more tests. Vaccines  Your health care provider may recommend certain vaccines, such as: Influenza vaccine. This is recommended every year. Tetanus, diphtheria, and acellular pertussis (Tdap, Td) vaccine. You may need a Td booster every 10 years. Zoster vaccine. You may need this after age 38. Pneumococcal 13-valent conjugate (PCV13) vaccine. One dose is recommended after age 20. Pneumococcal polysaccharide (PPSV23) vaccine. One dose is recommended after age 82. Talk to your health  care provider  about which screenings and vaccines you need and how often you need them. This information is not intended to replace advice given to you by your health care provider. Make sure you discuss any questions you have with your health care provider. Document Released: 07/22/2015 Document Revised: 03/14/2016 Document Reviewed: 04/26/2015 Elsevier Interactive Patient Education  2017 [place] Prevention in the Home Falls can cause injuries. They can happen to people of all ages. There are many things you can do to make your home safe and to help prevent falls. What can I do on the outside of my home? Regularly fix the edges of walkways and driveways and fix any cracks. Remove anything that might make you trip as you walk through a door, such as a raised step or threshold. Trim any bushes or trees on the path to your home. Use bright outdoor lighting. Clear any walking paths of anything that might make someone trip, such as rocks or tools. Regularly check to see if handrails are loose or broken. Make sure that both sides of any steps have handrails. Any raised decks and porches should have guardrails on the edges. Have any leaves, snow, or ice cleared regularly. Use sand or salt on walking paths during winter. Clean up any spills in your garage right away. This includes oil or grease spills. What can I do in the bathroom? Use night lights. Install grab bars by the toilet and in the tub and shower. Do not use towel bars as grab bars. Use non-skid mats or decals in the tub or shower. If you need to sit down in the shower, use a plastic, non-slip stool. Keep the floor dry. Clean up any water that spills on the floor as soon as it happens. Remove soap buildup in the tub or shower regularly. Attach bath mats securely with double-sided non-slip rug tape. Do not have throw rugs and other things on the floor that can make you trip. What can I do in the bedroom? Use night lights. Make sure  that you have a light by your bed that is easy to reach. Do not use any sheets or blankets that are too big for your bed. They should not hang down onto the floor. Have a firm chair that has side arms. You can use this for support while you get dressed. Do not have throw rugs and other things on the floor that can make you trip. What can I do in the kitchen? Clean up any spills right away. Avoid walking on wet floors. Keep items that you use a lot in easy-to-reach places. If you need to reach something above you, use a strong step stool that has a grab bar. Keep electrical cords out of the way. Do not use floor polish or wax that makes floors slippery. If you must use wax, use non-skid floor wax. Do not have throw rugs and other things on the floor that can make you trip. What can I do with my stairs? Do not leave any items on the stairs. Make sure that there are handrails on both sides of the stairs and use them. Fix handrails that are broken or loose. Make sure that handrails are as long as the stairways. Check any carpeting to make sure that it is firmly attached to the stairs. Fix any carpet that is loose or worn. Avoid having throw rugs at the top or bottom of the stairs. If you do have throw rugs, attach them to  the floor with carpet tape. Make sure that you have a light switch at the top of the stairs and the bottom of the stairs. If you do not have them, ask someone to add them for you. What else can I do to help prevent falls? Wear shoes that: Do not have high heels. Have rubber bottoms. Are comfortable and fit you well. Are closed at the toe. Do not wear sandals. If you use a stepladder: Make sure that it is fully opened. Do not climb a closed stepladder. Make sure that both sides of the stepladder are locked into place. Ask someone to hold it for you, if possible. Clearly mark and make sure that you can see: Any grab bars or handrails. First and last steps. Where the edge of  each step is. Use tools that help you move around (mobility aids) if they are needed. These include: Canes. Walkers. Scooters. Crutches. Turn on the lights when you go into a dark area. Replace any light bulbs as soon as they burn out. Set up your furniture so you have a clear path. Avoid moving your furniture around. If any of your floors are uneven, fix them. If there are any pets around you, be aware of where they are. Review your medicines with your doctor. Some medicines can make you feel dizzy. This can increase your chance of falling. Ask your doctor what other things that you can do to help prevent falls. This information is not intended to replace advice given to you by your health care provider. Make sure you discuss any questions you have with your health care provider. Document Released: 04/21/2009 Document Revised: 12/01/2015 Document Reviewed: 07/30/2014 Elsevier Interactive Patient Education  2017 [employer].

## 2021-12-20 ENCOUNTER — Other Ambulatory Visit: Payer: Self-pay | Admitting: Family Medicine

## 2021-12-20 DIAGNOSIS — Z1231 Encounter for screening mammogram for malignant neoplasm of breast: Secondary | ICD-10-CM

## 2022-01-01 DIAGNOSIS — M1711 Unilateral primary osteoarthritis, right knee: Secondary | ICD-10-CM | POA: Diagnosis not present

## 2022-01-08 ENCOUNTER — Ambulatory Visit: Payer: PPO | Admitting: Gynecologic Oncology

## 2022-01-18 ENCOUNTER — Encounter: Payer: Self-pay | Admitting: Gynecologic Oncology

## 2022-01-23 ENCOUNTER — Inpatient Hospital Stay: Payer: PPO | Attending: Gynecologic Oncology | Admitting: Gynecologic Oncology

## 2022-01-23 ENCOUNTER — Encounter: Payer: Self-pay | Admitting: Gynecologic Oncology

## 2022-01-23 ENCOUNTER — Other Ambulatory Visit: Payer: Self-pay

## 2022-01-23 VITALS — BP 142/88 | HR 72 | Temp 97.9°F | Resp 18 | Ht 64.0 in | Wt 177.6 lb

## 2022-01-23 DIAGNOSIS — Z8542 Personal history of malignant neoplasm of other parts of uterus: Secondary | ICD-10-CM | POA: Diagnosis not present

## 2022-01-23 DIAGNOSIS — Z90710 Acquired absence of both cervix and uterus: Secondary | ICD-10-CM | POA: Insufficient documentation

## 2022-01-23 DIAGNOSIS — Z90722 Acquired absence of ovaries, bilateral: Secondary | ICD-10-CM | POA: Insufficient documentation

## 2022-01-23 DIAGNOSIS — Z923 Personal history of irradiation: Secondary | ICD-10-CM | POA: Insufficient documentation

## 2022-01-23 DIAGNOSIS — C541 Malignant neoplasm of endometrium: Secondary | ICD-10-CM

## 2022-01-23 NOTE — Patient Instructions (Addendum)
It was nice to meet you today.  I do not see or feel any evidence of cancer on your exam.  We will continue with visits every 6 months.  You see Dr. Sondra Come in January.  Please call back sometime after the new year to schedule a visit to see me in July of next year.  If you develop any new and concerning symptoms before your next visit, please call to see me sooner.

## 2022-01-23 NOTE — Progress Notes (Signed)
Gynecologic Oncology Return Clinic Visit  01/23/22  Reason for Visit: Surveillance in the setting of early stage uterine cancer  Treatment History: She first began vaginal spotting in July 2017.  She was seen for an annual visit with Dr. Landry Mellow in October 2018 and reported this symptom.  A transvaginal ultrasound scan was performed on June 25, 2017 and revealed a normal-sized uterus measuring 7 x 4 x 2.7 cm with normal ovaries bilaterally.  The endometrial thickness was 1.28 cm.  On saline infusion the endometrial hyperechoic mass measured 7 mm. The tumor was MSI high, MMR normal.  She had MLH1 promotor methylation in her tumor but is negative for Lynch syndrome on genetic testing.    She was noted to have a systolic cardiac murmur and was seen by cardiology who performed a transthoracic echo on July 30, 2017.  This revealed moderate aortic stenosis.  The left ventricle was grossly normal in cavity size, though with some increased ventricular wall thickness.  Ejection fraction was 60-65%.  She was cleared for surgical procedure.  Of note she was asymptomatic from this aortic stenosis.  She has a remote history of rheumatic heart disease as a child.   On August 09, 2017 she underwent a hysteroscopy D&C.  Final pathology confirmed a FIGO grade 1 endometrioid endometrial adenocarcinoma.   On 10/08/17 she underwent robotic assisted total hysterectomy, BSO and SLN biopsy. Final pathology revealed a 3.2cm grade 2 endometrioid endometrial adenocarcinoma with outer half myo invasion (1 of 1.2cm) and no LVSI. All SLN's were negative as were adnexa and cervix. MSI pending. She was determined to have high/intermediate risk factors and was recommended to have vaginal brachytherapy in accordance with NCCN guidelines.   She completed vaginal brachytherapy with 30Gy in 5 fractions on 12/19/17.   She has persistent right lower quadrant intermittent pains which were evaluated with a CT scan in June, 2020 which  was negative for recurrence.   She had an aortic valve replaced in 2020 and is doing very well after this.    CT scan in December, 2021 for RLE edema was negative for metastatic disease or DVT.  Last mammogram 06/2021 Bone density 12/2020 Colonoscopy 04/2020  Interval History: Patient reports doing well.  She denies any abdominal or pelvic pain.  Denies any vaginal discharge or bleeding.  Reports some increased nocturia.  Reports regular bowel function.  Enjoying camping with her husband.  Past Medical/Surgical History: Past Medical History:  Diagnosis Date   Anxiety and depression 08/26/2007   Qualifier: Diagnosis of  By: Glori Bickers MD, Carmell Austria    Arthritis    knees   Constipation, slow transit 11/03/2010   Degenerative disk disease 08/31/2011   Depression with anxiety    DERMATITIS, SEBORRHEIC 08/25/2010   Qualifier: Diagnosis of  By: Glori Bickers MD, Carmell Austria    Endometrial cancer [facility])    02/ 2019  Diagnosed with D&C/hysteroscopy with polypectomy   Essential hypertension 08/29/2017   Fibromyalgia    tx mobic   Full dentures    GERD 08/26/2007   Qualifier: Diagnosis of  By: Glori Bickers MD, Carmell Austria    Gout    Hearing loss    wears bilateral hearing aids   History of colon polyps    History of nonmelanoma skin cancer    right lower leg   History of poliomyelitis 08/26/2007   Qualifier: History of  By: Glori Bickers MD, Carmell Austria    History of radiation therapy    Endometium- 11/20/17-12/11/17- Vag Cuff -Dr. Jeneen Rinks  [place]   Hyperlipidemia, mild 09/02/2012   Hypertension    Hypokalemia 01/02/2011   IBS (irritable bowel syndrome)    with constipation   Obesity 08/26/2007   Qualifier: Diagnosis of  By: Glori Bickers MD, Carmell Austria    OSTEOARTHRITIS, HANDS, BILATERAL 08/26/2007   Qualifier: Diagnosis of  By: Glori Bickers MD, Carmell Austria    Osteopenia    OVERACTIVE BLADDER 08/26/2007   Qualifier: Diagnosis of  By: Glori Bickers MD, Carmell Austria    Personal history of colonic polyps 11/03/2010   05/2018 13 polyps  mostly adenomas, ssp's - recall 1 year 2020 Gatha Mayer, MD, [facility]    Poor balance 11/25/2018   With falls  Has rise in R shoe/post polio  Weak legs as well     S/P TAVR (transcatheter aortic valve replacement)    s/p TAVR with a 46m Edwards S3U via the TF approach by Dr. BCyndia Bentand Dr. MAngelena Form  Severe calcific aortic stenosis 08/27/2017    -> Became severe as of October 2020-referred for TAVR on 04/28/2019   URINARY INCONTINENCE 08/26/2007   Qualifier: Diagnosis of  By: TGlori BickersMD, MCarmell Austria   Wears hearing aid in both ears     Past Surgical History:  Procedure Laterality Date   ANKLE SURGERY Right 1StellaEXTRACTION Right 11/16/2019   COLONOSCOPY N/A 06/24/2015   Procedure: COLONOSCOPY;  Surgeon: CGatha Mayer MD;  Location: [facility];  Service: Endoscopy;  Laterality: N/A;   DILATATION & CURETTAGE/HYSTEROSCOPY WITH MYOSURE N/A 08/09/2017   Procedure: DILATATION & CURETTAGE/HYSTEROSCOPY WITH MYOSURE;  Surgeon: CChristophe Louis MD;  [facility];  Service: Gynecology;  Laterality: N/A;  Polypectomy   DILATION AND CURETTAGE OF UTERUS     PMB   HAND SURGERY Bilateral 1999   Thumbs    KNEE SURGERY Bilateral 1998   x2   LYMPH NODE BIOPSY N/A 10/08/2017   Procedure: LYMPH NODE BIOPSY;  Surgeon: REveritt Amber MD;  [facility]: [facility];  Service: Gynecology;  Laterality: N/A;   MULTIPLE TOOTH EXTRACTIONS     NASAL SINUS SURGERY  1976   polio surgery.     right foot - right leg 1.5" shorter than left leg - some wekness   RIGHT/LEFT HEART CATH AND CORONARY ANGIOGRAPHY N/A 04/09/2019   Procedure: RIGHT/LEFT HEART CATH AND CORONARY ANGIOGRAPHY;  Surgeon: HLeonie Man MD;  [facility];  Service: Cardiovascular;; Severe AS, mean gradient 50 mmHg P-P 55 mmHg (estimated AVA 0.76 cm).  Mildly elevated pulmonary pressures with elevated LVEDP and PCWP.  Angiographically normal coronary arteries, but tortuous.   ROBOTIC  ASSISTED TOTAL HYSTERECTOMY WITH BILATERAL SALPINGO OOPHERECTOMY Bilateral 10/08/2017   Procedure: XI ROBOTIC ASSISTED TOTAL HYSTERECTOMY WITH BILATERAL SALPINGO OOPHORECTOMY;  Surgeon: REveritt Amber MD;  Location: [facility];  Service: Gynecology;  Laterality: Bilateral;   TEE WITHOUT CARDIOVERSION N/A 04/28/2019   Procedure: TRANSESOPHAGEAL ECHOCARDIOGRAM (TEE);  Surgeon: MBurnell Blanks MD;  [facility];  Service: Open Heart Surgery;  Laterality: N/A;   THUMB ARTHROSCOPY  1999   TONSILLECTOMY  1704  76years old   TRANSCATHETER AORTIC VALVE REPLACEMENT, TRANSFEMORAL N/A 04/28/2019   Procedure: TRANSCATHETER AORTIC VALVE REPLACEMENT, TRANSFEMORAL;  Surgeon: MBurnell Blanks MD;  [facility]; ; 23 mm Sapien 3 Ultra THV via TF approach. (post-op mean AV gradient 13 mmHg)   TRANSTHORACIC ECHOCARDIOGRAM  05/04/2020   EF 60 to 65%.  Normal LV function and wall motion.  Indeterminate diastolic parameters.  Normal RV size and pressure.  Moderate LA dilation.  Moderate MAC but no significant MR.  Post TAVR with 23 mm Sapien 3 valve: Mean gradient 14 mmHg.  No PVL.  Normal IVC   TRANSTHORACIC ECHOCARDIOGRAM  02/17/2019   Mod LVH. GR 2 DD. Severe AS (mean gradient = 44 mmHg with Mod AI. mild MS.   TRANSTHORACIC ECHOCARDIOGRAM  04/29/2019; 05/2020   a)  (post TAVR): EF 60-65%.  GRII DD.  Mod LA dilation & nl RA.  Mod MAC.  No  AI (no-PVL), mean AV gradient 13 mmHg.;; b) (2nd post TAVR) EF 60 to 65%.  Severely increased thickness/LVH.  GRII DD with only mildly elevated left atrial pressure.  Normal RA size.  Severe MAC with only trace MR.  No MS.  TAVR valve prosthesis functioning well.  Trivial AI.  Mean gradient 15 mmHg with a peak 29.8 mmHg   TUBAL LIGATION     UPPER GI ENDOSCOPY     normal   WISDOM TOOTH EXTRACTION      Family History  Problem Relation Age of Onset   Kidney disease Mother    Alzheimer's disease Mother    Stroke Father 74   Colon cancer  Father 14       possibly colon cancer, possibly just polyps   Kidney disease Sister    Heart attack Sister    Colon cancer Paternal Aunt 63   Cancer Paternal Uncle        unk type   Esophageal cancer Neg Hx    Stomach cancer Neg Hx    Rectal cancer Neg Hx    Breast cancer Neg Hx    Ovarian cancer Neg Hx    Endometrial cancer Neg Hx    Pancreatic cancer Neg Hx    Prostate cancer Neg Hx     Social History   Socioeconomic History   Marital status: Married    Spouse name: Not on file   Number of children: 2   Years of education: Not on file   Highest education level: High school graduate  Occupational History   Occupation: RECEPTION    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  She has 2  children and 3 grandchildren.     She is currently retired.  But enjoys cleaning house and doing chores.  She likes to do yard work.  Does not routinely exercise.   Social Determinants of Health   Financial Resource Strain: Low Risk  (12/15/2021)   Overall Financial Resource Strain (CARDIA)    Difficulty of Paying Living Expenses: Not hard at all  Food Insecurity: No Food Insecurity (12/15/2021)   Hunger Vital Sign    Worried About Running Out of Food in the Last Year: Never true    Ran Out of Food in the Last Year: Never true  Transportation Needs: No Transportation Needs (12/15/2021)   PRAPARE - Hydrologist (Medical): No    Lack  of Transportation (Non-Medical): No  Physical Activity: Insufficiently Active (12/15/2021)   Exercise Vital Sign    Days of  Exercise per Week: 3 days    Minutes of Exercise per Session: 30 min  Stress: No Stress Concern Present (12/15/2021)   [place]    Feeling of Stress : Only a little  Social Connections: Moderately Integrated (12/15/2021)   Social Connection and Isolation Panel [NHANES]    Frequency of Communication with Friends and Family: More than three times a week    Frequency of Social Gatherings with Friends and Family: Once a week    Attends Religious Services: More than 4 times per year    Active Member of [employer] or Organizations: No    Attends Archivist Meetings: Never    Marital Status: Married    Current Medications:  Current Outpatient Medications:    acetaminophen (TYLENOL) 325 MG tablet, Take 650 mg by mouth every 6 (six) hours as needed (for pain.)., Disp: , Rfl:    acetic acid-hydrocortisone (VOSOL-HC) OTIC solution, Place 2 drops into both ears 2 (two) times daily as needed (itching)., Disp: , Rfl:    amLODipine (NORVASC) 5 MG tablet, Take 1 tablet by mouth daily., Disp: , Rfl:    Ascorbic Acid (VITAMIN C) 1000 MG tablet, Take 1,000 mg by mouth daily with breakfast., Disp: , Rfl:    aspirin EC 81 MG tablet, Take 81 mg by mouth daily., Disp: , Rfl:    Black Pepper-Turmeric (TURMERIC COMPLEX/BLACK PEPPER PO), Take 15 mLs by mouth daily with breakfast. , Disp: , Rfl:    Calcium-Phosphorus-Vitamin D (CALCIUM/VITAMIN D3/ADULT GUMMY PO), Take 1 tablet by mouth 2 (two) times daily. , Disp: , Rfl:    cetirizine (ZYRTEC) 10 MG tablet, Take 5 mg by mouth as needed for allergies., Disp: , Rfl:    cholecalciferol (VITAMIN D) 1000 UNITS tablet, Take 1,000 Units by mouth daily with breakfast. , Disp: , Rfl:    fluticasone (FLONASE) 50 MCG/ACT nasal spray, Place 2 sprays into both nostrils at bedtime. , Disp: , Rfl:    furosemide (LASIX) 20 MG tablet, MAY TAKE 1 TABLET (20 MG TOTAL) UP TO THREE TIMES A WEEK. DO NOT TAKE HCTZ THAT  DAY., Disp: 30 tablet, Rfl: 11   hydrochlorothiazide (HYDRODIURIL) 25 MG tablet, Take 1 tablet (25 mg total) by mouth daily with lunch., Disp: 90 tablet, Rfl: 3   lubiprostone (AMITIZA) 24 MCG capsule, Take 1 capsule (24 mcg total) by mouth 2 (two) times daily. LUNCH & SUPPER, Disp: 180 capsule, Rfl: 3   meclizine (ANTIVERT) 25 MG tablet, Take 0.5-1 tablets (12.5-25 mg total) by mouth 3 (three) times daily as needed for dizziness. Caution of sedation, Disp: 20 tablet, Rfl: 0   meloxicam (MOBIC) 7.5 MG tablet, TAKE 1 TABLET BY MOUTH AT BEDTIME., Disp: 90 tablet, Rfl: 3   metoprolol succinate (TOPROL-XL) 25 MG 24 hr tablet, TAKE 1 TABLET BY MOUTH DAILY., Disp: 90 tablet, Rfl: 3   NEXIUM 40 MG capsule, 1 Add'l Sig oral Select Frequency, Disp: , Rfl:    Polyethyl Glycol-Propyl Glycol 0.4-0.3 % SOLN, Place 1-2 drops into both eyes 2 (two) times daily. , Disp: , Rfl:    potassium chloride (KLOR-CON) 10 MEQ tablet, Take 2 tablets (20 mEq total) by mouth 2 (two) times daily., Disp: 360 tablet, Rfl: 3   sodium chloride (OCEAN) 0.65 % nasal spray, Place 1 spray into the nose daily., Disp: , Rfl:  triamcinolone (KENALOG) 0.1 %, Apply topically., Disp: , Rfl:    venlafaxine XR (EFFEXOR XR) 75 MG 24 hr capsule, Take 1 capsule (75 mg total) by mouth daily after breakfast., Disp: 90 capsule, Rfl: 3   amLODipine (NORVASC) 10 MG tablet, Take 1 tablet (10 mg total) by mouth daily., Disp: 180 tablet, Rfl: 3   Simethicone (PHAZYME PO), Take 1 tablet by mouth at bedtime as needed (for gas).  (Patient not taking: Reported on 01/18/2022), Disp: , Rfl:   Review of Systems: Denies appetite changes, fevers, chills, fatigue, unexplained weight changes. Denies hearing loss, neck lumps or masses, mouth sores, ringing in ears or voice changes. Denies cough or wheezing.  Denies shortness of breath. Denies chest pain or palpitations. Denies leg swelling. Denies abdominal distention, pain, blood in stools, constipation,  diarrhea, nausea, vomiting, or early satiety. Denies pain with intercourse, dysuria, frequency, hematuria or incontinence. Denies hot flashes, pelvic pain, vaginal bleeding or vaginal discharge.   Denies joint pain, back pain or muscle pain/cramps. Denies itching, rash, or wounds. Denies dizziness, headaches, numbness or seizures. Denies swollen lymph nodes or glands, denies easy bruising or bleeding. Denies anxiety, depression, confusion, or decreased concentration.  Physical Exam: BP (!) 171/62 (BP Location: Right Arm, Patient Position: Sitting)   Pulse 72   Temp 97.9 F (36.6 C) (Tympanic)   Resp 18   Ht _0  (1.626 m)   Wt 177 lb 9.6 oz (80.6 kg)   SpO2 98%   BMI 30.48 kg/m  General: Alert, oriented, no acute distress. HEENT: Normocephalic, atraumatic, sclera anicteric. Chest: Clear to auscultation bilaterally.  No wheezes or rhonchi. Cardiovascular: Regular rate and rhythm, no murmurs. Abdomen: Obese, soft, nontender.  Normoactive bowel sounds.  No masses or hepatosplenomegaly appreciated.  Well-healed incisions. Extremities: Grossly normal range of motion.  Warm, well perfused.  No edema bilaterally. Skin: No rashes or lesions noted. Lymphatics: No cervical, supraclavicular, or inguinal adenopathy. GU: Normal appearing external genitalia without erythema, excoriation, or lesions.  Speculum exam reveals mildly atrophic vaginal mucosa, no lesions or masses.  Mild scarring at the vaginal cuff.  Bimanual exam reveals cuff intact without nodularity or masses.  Rectovaginal exam confirms findings.  Laboratory & Radiologic Studies: None new  Assessment & Plan: Veronica Greene is a 76 y.o. woman with stage IB grade 2 endometrioid endometrial adenocarcinoma (MSI high/MMR normal).   High/intermediate risk factors for recurrence. s/p vaginal brachytherapy completed 12/19/17. No evidence of disease on exam.    Patient is doing well.  Per NCCN surveillance guidelines, we will  continue with follow-up every 6 months alternating between our clinic and radiation oncology. Pap smear is not recommended in routine endometrial cancer surveillance.  She will see Dr Sondra Come in 6 months and myself in 12 months.  If she remains disease-free, we will plan to discharge her back to her OB/GYN in 12/2022.  20 minutes of total time was spent for this patient encounter, including preparation, face-to-face counseling with the patient and coordination of care, and documentation of the encounter.  Jeral Pinch, MD  Division of Gynecologic Oncology  Department of Obstetrics and Gynecology  [facility]

## 2022-01-29 DIAGNOSIS — M1711 Unilateral primary osteoarthritis, right knee: Secondary | ICD-10-CM | POA: Diagnosis not present

## 2022-01-31 ENCOUNTER — Telehealth: Payer: Self-pay

## 2022-01-31 NOTE — Telephone Encounter (Signed)
Please arrange virtual telephone visit for cardiac clearance ?

## 2022-01-31 NOTE — Telephone Encounter (Signed)
   Pre-operative Risk Assessment    Patient Name: Veronica Greene  DOB: 10/20/1945 MRN: [id number]      Request for Surgical Clearance    Procedure:   Right Total Knee Replacement  Date of Surgery:  Clearance TBD                                 Surgeon:  Edmonia Lynch, MD Surgeon's Group or Practice Name:  [facility] Phone number:  [phone] Fax number:  [phone]   Type of Clearance Requested:   - Medical    Type of Anesthesia:   Choice   Additional requests/questions:  Please advise surgeon/provider what medications should be held.  Signed, Elsie Lincoln Hollyanne Schloesser   01/31/2022, 1:27 PM

## 2022-02-01 NOTE — Telephone Encounter (Signed)
I s/w the pt today about tele pre op appt. Pt tells me that her surgery is most likely not going to be until sometime in 05/2022. Pt states she has her appt with Dr. Ellyn Hack 05/07/22 and wants to keep that and she will d/w MD at the appt about pre op clearance. Pt said she is also hoping to get some camping in before her surgery. Pt thanked me for the call and the help today. I will send FYI to requesting office of my call with the pt today.   Once Dr. Ellyn Hack has cleared the pt, he will his nurse/cma fax over clearance notes and any medication recommendations.

## 2022-02-28 ENCOUNTER — Other Ambulatory Visit: Payer: Self-pay | Admitting: Cardiology

## 2022-02-28 ENCOUNTER — Other Ambulatory Visit: Payer: Self-pay | Admitting: Family Medicine

## 2022-02-28 NOTE — Telephone Encounter (Signed)
Last filled 10/25/21 Last ov 11/30/21

## 2022-03-01 ENCOUNTER — Telehealth: Payer: Self-pay | Admitting: Cardiology

## 2022-03-01 NOTE — Telephone Encounter (Signed)
*  STAT* If patient is at the pharmacy, call can be transferred to refill team.   1. Which medications need to be refilled? (please list name of each medication and dose if known) metoprolol succinate (TOPROL-XL) 25 MG 24 hr tablet  2. Which pharmacy/location (including street and city if local pharmacy) is medication to be sent to? [place], [place]   3. Do they need a 30 day or 90 day supply? [place]

## 2022-03-02 ENCOUNTER — Other Ambulatory Visit: Payer: Self-pay | Admitting: *Deleted

## 2022-03-02 MED ORDER — METOPROLOL SUCCINATE ER 25 MG PO TB24: 25.0000 mg | ORAL_TABLET | Freq: Every day | ORAL | 3 refills | Status: DC

## 2022-03-02 NOTE — Telephone Encounter (Signed)
Pt is calling to f/u. Pt is leaving on a trip and needs refill today

## 2022-03-27 NOTE — Progress Notes (Unsigned)
Cardiology Clinic Note   Patient Name: Veronica Greene Date of Encounter: 03/29/2022  Primary Care Provider:  Abner Greenspan, MD Primary Cardiologist:  Glenetta Hew, MD  Patient Profile    Veronica Greene 76 year old female presents to the clinic today for follow-up evaluation of her essential hypertension, hyperlipidemia and preoperative cardiac evaluation  Past Medical History    Past Medical History:  Diagnosis Date   Anxiety and depression 08/26/2007   Qualifier: Diagnosis of  By: Glori Bickers MD, Carmell Austria    Arthritis    knees   Constipation, slow transit 11/03/2010   Degenerative disk disease 08/31/2011   Depression with anxiety    DERMATITIS, SEBORRHEIC 08/25/2010   Qualifier: Diagnosis of  By: Glori Bickers MD, Carmell Austria    Endometrial cancer [facility])    02/ 2019  Diagnosed with D&C/hysteroscopy with polypectomy   Essential hypertension 08/29/2017   Fibromyalgia    tx mobic   Full dentures    GERD 08/26/2007   Qualifier: Diagnosis of  By: Glori Bickers MD, Carmell Austria    Gout    Hearing loss    wears bilateral hearing aids   History of colon polyps    History of nonmelanoma skin cancer    right lower leg   History of poliomyelitis 08/26/2007   Qualifier: History of  By: Glori Bickers MD, Carmell Austria    History of radiation therapy    Endometium- 11/20/17-12/11/17- Vag Cuff -Dr. Gery Pray   Hyperlipidemia, mild 09/02/2012   Hypertension    Hypokalemia 01/02/2011   IBS (irritable bowel syndrome)    with constipation   Obesity 08/26/2007   Qualifier: Diagnosis of  By: Glori Bickers MD, Carmell Austria    OSTEOARTHRITIS, HANDS, BILATERAL 08/26/2007   Qualifier: Diagnosis of  By: Glori Bickers MD, Carmell Austria    Osteopenia    OVERACTIVE BLADDER 08/26/2007   Qualifier: Diagnosis of  By: Glori Bickers MD, Carmell Austria    Personal history of colonic polyps 11/03/2010   05/2018 13 polyps mostly adenomas, ssp's - recall 1 year 2020 Gatha Mayer, MD, [facility]    Poor balance 11/25/2018   With falls  Has rise in R  shoe/post polio  Weak legs as well     S/P TAVR (transcatheter aortic valve replacement)    s/p TAVR with a 75m Edwards S3U via the TF approach by Dr. BCyndia Bentand Dr. MAngelena Form  Severe calcific aortic stenosis 08/27/2017    -> Became severe as of October 2020-referred for TAVR on 04/28/2019   URINARY INCONTINENCE 08/26/2007   Qualifier: Diagnosis of  By: TGlori BickersMD, MCarmell Austria   Wears hearing aid in both ears    Past Surgical History:  Procedure Laterality Date   ANKLE SURGERY Right 1KennardEXTRACTION Right 11/16/2019   COLONOSCOPY N/A 06/24/2015   Procedure: COLONOSCOPY;  Surgeon: CGatha Mayer MD;  Location: [facility];  Service: Endoscopy;  Laterality: N/A;   DILATATION & CURETTAGE/HYSTEROSCOPY WITH MYOSURE N/A 08/09/2017   Procedure: DILATATION & CURETTAGE/HYSTEROSCOPY WITH MYOSURE;  Surgeon: CChristophe Louis MD;  [facility];  Service: Gynecology;  Laterality: N/A;  Polypectomy   DILATION AND CURETTAGE OF UTERUS     PMB   HAND SURGERY Bilateral 1999   Thumbs    KNEE SURGERY Bilateral 1998   x2   LYMPH NODE BIOPSY N/A 10/08/2017   Procedure: LYMPH NODE BIOPSY;  Surgeon: REveritt Amber MD;  Location: [facility];  Service: Gynecology;  Laterality: N/A;   MULTIPLE TOOTH EXTRACTIONS     NASAL SINUS SURGERY  1976   polio surgery.     right foot - right leg 1.5" shorter than left leg - some wekness   RIGHT/LEFT HEART CATH AND CORONARY ANGIOGRAPHY N/A 04/09/2019   Procedure: RIGHT/LEFT HEART CATH AND CORONARY ANGIOGRAPHY;  Surgeon: Leonie Man, MD;  Location: [place] [facility];  Service: Cardiovascular;; Severe AS, mean gradient 50 mmHg P-P 55 mmHg (estimated AVA 0.76 cm).  Mildly elevated pulmonary pressures with elevated LVEDP and PCWP.  Angiographically normal coronary arteries, but tortuous.   ROBOTIC ASSISTED TOTAL HYSTERECTOMY WITH BILATERAL SALPINGO OOPHERECTOMY Bilateral 10/08/2017   Procedure: XI ROBOTIC ASSISTED TOTAL  HYSTERECTOMY WITH BILATERAL SALPINGO OOPHORECTOMY;  Surgeon: [name], MD;  Location: [facility];  Service: Gynecology;  Laterality: Bilateral;   TEE WITHOUT CARDIOVERSION N/A 04/28/2019   Procedure: TRANSESOPHAGEAL ECHOCARDIOGRAM (TEE);  Surgeon: Burnell Blanks, MD;  Location: [place] [facility];  Service: Open Heart Surgery;  Laterality: N/A;   THUMB ARTHROSCOPY  1999   TONSILLECTOMY  377   76 years old   TRANSCATHETER AORTIC VALVE REPLACEMENT, TRANSFEMORAL N/A 04/28/2019   Procedure: TRANSCATHETER AORTIC VALVE REPLACEMENT, TRANSFEMORAL;  Surgeon: Burnell Blanks, MD;  Location: [place] [facility]; ; 23 mm Sapien 3 Ultra THV via TF approach. (post-op mean AV gradient 13 mmHg)   TRANSTHORACIC ECHOCARDIOGRAM  05/04/2020   EF 60 to 65%.  Normal LV function and wall motion.  Indeterminate diastolic parameters.  Normal RV size and pressure.  Moderate LA dilation.  Moderate MAC but no significant MR.  Post TAVR with 23 mm Sapien 3 valve: Mean gradient 14 mmHg.  No PVL.  Normal IVC   TRANSTHORACIC ECHOCARDIOGRAM  02/17/2019   Mod LVH. GR 2 DD. Severe AS (mean gradient = 44 mmHg with Mod AI. mild MS.   TRANSTHORACIC ECHOCARDIOGRAM  04/29/2019; 05/2020   a)  (post TAVR): EF 60-65%.  GRII DD.  Mod LA dilation & nl RA.  Mod MAC.  No  AI (no-PVL), mean AV gradient 13 mmHg.;; b) (2nd post TAVR) EF 60 to 65%.  Severely increased thickness/LVH.  GRII DD with only mildly elevated left atrial pressure.  Normal RA size.  Severe MAC with only trace MR.  No MS.  TAVR valve prosthesis functioning well.  Trivial AI.  Mean gradient 15 mmHg with a peak 29.8 mmHg   TUBAL LIGATION     UPPER GI ENDOSCOPY     normal   WISDOM TOOTH EXTRACTION      Allergies  Allergies  Allergen Reactions   Clarithromycin Swelling and Other (See Comments)    Throat Swells   Penicillins Hives and Other (See Comments)    WELTS Has patient had a PCN reaction causing immediate rash, facial/tongue/throat swelling, SOB or  lightheadedness with hypotension: No Has patient had a PCN reaction causing severe rash involving mucus membranes or skin necrosis: Unknown Has patient had a PCN reaction that required hospitalization:No Has patient had a PCN reaction occurring within the last 10 years: No If all of the above answers are "NO", then may proceed with Cephalosporin use.    Codeine Nausea And Vomiting    History of Present Illness    Jacoya Bauman is a PMH of HLD, HTN, poor balance, obesity, and is status post TAVR.  She underwent TAVR for severe aortic stenosis 10/20.  Her PMH also includes stage Ib grade 2 endometrial adenocarcinoma status post robotic assisted TAH/BSO and SLN  biopsy.  She was seen in follow-up by Dr. Ellyn Hack 05/01/2021.  During that time she continued to do well from a cardiac standpoint.  She was NYHA class I.  She continued to have right leg swelling.  She reported that she was not able to wear lower extremity support stockings due to how uncomfortable they were.  She denied orthopnea, PND, exertional chest pain or dyspnea.  She denied falls.  She was noted to have a 1-2 out of 6 systolic murmur heard best along right upper sternal border.  Her amlodipine was increased to 10 mg daily.  Follow-up was planned for 6 months.  Her follow-up echocardiogram 05/15/2021 showed an LVEF of 60 to 65%, G2 DD, mild mitral regurgitation, well-functioning TAVR valve with a mean gradient of 18 mmHg.  She presents the clinic today for follow-up evaluation and preoperative cardiac evaluation.  She states she feels well.  She did have a fall in August and she has recovered from it.  She presented to her chiropractor who did a thorough assessment/evaluation.  She is able to complete greater than 4 METS of physical activity.  She denies increased shortness of breath and chest pain.  She does note some tendinitis which she feels is gotten worse over the year.  She is following with ENT.  I will have her follow-up as  scheduled and recommended echocardiogram in November.  Today she denies chest pain, shortness of breath, lower extremity edema, fatigue, palpitations, melena, hematuria, hemoptysis, diaphoresis, weakness, presyncope, syncope, orthopnea, and PND.  Home Medications    Prior to Admission medications   Medication Sig Start Date End Date Taking? Authorizing Provider  acetaminophen (TYLENOL) 325 MG tablet Take 650 mg by mouth every 6 (six) hours as needed (for pain.).    [provider]  acetic acid-hydrocortisone (VOSOL-HC) OTIC solution Place 2 drops into both ears 2 (two) times daily as needed (itching).    [provider]  amLODipine (NORVASC) 10 MG tablet Take 1 tablet (10 mg total) by mouth daily. 05/01/21 07/30/21  Leonie Man, MD  amLODipine (NORVASC) 5 MG tablet Take 1 tablet by mouth daily. 07/28/20   [provider]  Ascorbic Acid (VITAMIN C) 1000 MG tablet Take 1,000 mg by mouth daily with breakfast.    [provider]  aspirin EC 81 MG tablet Take 81 mg by mouth daily.    [provider]  Black Pepper-Turmeric (TURMERIC COMPLEX/BLACK PEPPER PO) Take 15 mLs by mouth daily with breakfast.     [provider]  Calcium-Phosphorus-Vitamin D (CALCIUM/VITAMIN D3/ADULT GUMMY PO) Take 1 tablet by mouth 2 (two) times daily.     [provider]  cetirizine (ZYRTEC) 10 MG tablet Take 5 mg by mouth as needed for allergies.    [provider]  cholecalciferol (VITAMIN D) 1000 UNITS tablet Take 1,000 Units by mouth daily with breakfast.     [provider]  fluticasone (FLONASE) 50 MCG/ACT nasal spray Place 2 sprays into both nostrils at bedtime.     [provider]  furosemide (LASIX) 20 MG tablet MAY TAKE 1 TABLET (20 MG TOTAL) UP TO THREE TIMES A WEEK. DO NOT TAKE HCTZ THAT DAY. 06/20/21   Leonie Man, MD  hydrochlorothiazide (HYDRODIURIL) 25 MG tablet Take 1 tablet (25 mg total) by mouth daily with  lunch. 11/30/21   Tower, [name], MD  lubiprostone (AMITIZA) 24 MCG capsule Take 1 capsule (24 mcg total) by mouth 2 (two) times daily. LUNCH & SUPPER  11/30/21   Tower, [name], MD  meclizine (ANTIVERT) 25 MG tablet Take 0.5-1 tablets (12.5-25 mg total) by mouth 3 (three) times daily as needed for dizziness. Caution of sedation 06/15/21   Tower, Roque Lias A, MD  meloxicam (MOBIC) 7.5 MG tablet TAKE 1 TABLET BY MOUTH AT BEDTIME. 02/28/22   Tower, [name], MD  metoprolol succinate (TOPROL-XL) 25 MG 24 hr tablet Take 1 tablet (25 mg total) by mouth daily. 03/02/22   Leonie Man, MD  NEXIUM 40 MG capsule 1 Add'l Sig oral Select Frequency 10/26/19   [provider]  Polyethyl Glycol-Propyl Glycol 0.4-0.3 % SOLN Place 1-2 drops into both eyes 2 (two) times daily.     [provider]  potassium chloride (KLOR-CON) 10 MEQ tablet Take 2 tablets (20 mEq total) by mouth 2 (two) times daily. 11/30/21   Tower, [name], MD  Simethicone (PHAZYME PO) Take 1 tablet by mouth at bedtime as needed (for gas).  Patient not taking: Reported on 01/18/2022    [provider]  sodium chloride (OCEAN) 0.65 % nasal spray Place 1 spray into the nose daily.    [provider]  triamcinolone (KENALOG) 0.1 % Apply topically. 04/26/20   [provider]  venlafaxine XR (EFFEXOR XR) 75 MG 24 hr capsule Take 1 capsule (75 mg total) by mouth daily after breakfast. 11/30/21   Tower, [name], MD    Family History    Family History  Problem Relation Age of Onset   Kidney disease Mother    Alzheimer's disease Mother    Stroke Father 17   Colon cancer Father 21       possibly colon cancer, possibly just polyps   Kidney disease Sister    Heart attack Sister    Colon cancer Paternal Aunt 22   Cancer Paternal Uncle        unk type   Esophageal cancer Neg Hx    Stomach cancer Neg Hx    Rectal cancer Neg Hx    Breast cancer Neg Hx    Ovarian cancer Neg Hx    Endometrial cancer Neg Hx     Pancreatic cancer Neg Hx    Prostate cancer Neg Hx    She indicated that her mother is deceased. She indicated that her father is deceased. She indicated that her sister is deceased. She indicated that her maternal grandmother is deceased. She indicated that her maternal grandfather is deceased. She indicated that her paternal grandmother is deceased. She indicated that her paternal grandfather is deceased. She indicated that only one of her two sons is alive. She indicated that her maternal uncle is deceased. She indicated that all of her three paternal aunts are deceased. She indicated that both of her paternal uncles are deceased. She indicated that the status of her neg hx is unknown.  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  She has 2  children and 3 grandchildren.     She is currently retired.  But enjoys cleaning house and doing chores.  She likes to do yard work.  Does not routinely exercise.   Social Determinants of Health   Financial Resource Strain: Low Risk  (12/15/2021)   Overall Financial Resource Strain (CARDIA)    Difficulty of Paying Living Expenses: Not hard at all  Food Insecurity: No Food Insecurity (12/15/2021)   Hunger Vital Sign    Worried About Running Out of Food in the Last Year: Never true     Ran Out of Food in the Last Year: Never true  Transportation Needs: No Transportation Needs (12/15/2021)   PRAPARE - Hydrologist (Medical): No    Lack of Transportation (Non-Medical): No  Physical Activity: Insufficiently Active (12/15/2021)   Exercise Vital Sign    Days of Exercise per Week: 3 days    Minutes of Exercise per Session: 30 min  Stress: No Stress Concern Present (12/15/2021)   [place]    Feeling of Stress : Only a little  Social Connections: Moderately Integrated (12/15/2021)   Social Connection and Isolation Panel [NHANES]    Frequency of Communication with Friends and Family: More than three times a week    Frequency of Social Gatherings with Friends and Family: Once a week    Attends Religious Services: More than 4 times per year    Active Member of [employer] or Organizations: No    Attends Archivist Meetings: Never    Marital Status: Married  Human resources officer Violence: Not At Risk (12/15/2021)   Humiliation, Afraid, Rape, and Kick questionnaire    Fear of Current or Ex-Partner: No    Emotionally Abused: No    Physically Abused: No    Sexually Abused: No     Review of Systems    General:  No chills, fever, night sweats or weight changes.  Cardiovascular:  No chest pain, dyspnea on exertion, edema, orthopnea, palpitations, paroxysmal nocturnal dyspnea. Dermatological: No rash, lesions/masses Respiratory: No cough, dyspnea Urologic: No hematuria, dysuria Abdominal:   No nausea, vomiting, diarrhea, bright red blood per rectum, melena, or hematemesis Neurologic:  No visual changes, wkns, changes in mental status. All other systems reviewed and are otherwise negative except as noted above.  Physical Exam    VS:  BP (!) 130/58   Pulse 70   Ht _0  (1.626 m)   Wt 180 lb 12.8 oz (82 kg)   SpO2 97%   BMI 31.03 kg/m  , BMI Body mass index is 31.03 kg/m. GEN: Well  nourished, well developed, in no acute distress. HEENT: normal. Neck: Supple, no JVD, carotid bruits, or masses. Cardiac: RRR, no murmurs, rubs, or gallops. No clubbing, cyanosis, edema.  Radials/DP/PT 2+ and equal bilaterally.  Respiratory:  Respirations regular and unlabored, clear to auscultation bilaterally. GI: Soft, nontender, nondistended, BS + x 4. MS: no deformity or atrophy. Skin: warm and dry, no rash. Neuro:  Strength and sensation are intact. Psych: Normal affect.  Accessory Clinical Findings    Recent Labs: 11/23/2021: ALT 15; BUN 10; Creatinine, Ser 0.64; Hemoglobin 12.5; Platelets 197.0; Potassium 4.0; Sodium 142; TSH 2.22   Recent Lipid Panel  Component Value Date/Time   CHOL 179 11/23/2021 0853   TRIG 153.0 (H) 11/23/2021 0853   HDL 48.80 11/23/2021 0853   CHOLHDL 4 11/23/2021 0853   VLDL 30.6 11/23/2021 0853   LDLCALC 100 (H) 11/23/2021 0853         ECG personally reviewed by me today-normal sinus rhythm septal infarct undetermined age 77 bpm no ectopy- No acute changes  Assessment & Plan   1.  Essential Hypertension- Bp today 130/58.  Well-controlled at home. Continue amlodipine, HCTZ, metoprolol Heart healthy low-sodium diet-salty 6 given Increase physical activity as tolerated  S/P Tavr-No increased DOE or activity intolerane.  Echocardiogram 05/15/2021 showed an LVEF of 65-70%, G1 DD, mild mitral valve regurgitation, prosthetic aortic valve with a mean gradient of 18 mmHg. Continue metoprolol Heart healthy low-sodium diet-salty 6 given Increase physical activity as tolerated Recommend repeat echocardiogram 11/23.  Hyperlipidemia-LDL 100 on 11/23/21.  Goal less than 100 LDL Heart healthy low-sodium high fiber diet Increase physical activity as tolerated Wishes to defer statin continue to monitor.  Pre Op cardiac exam- [facility] Dr. Percell Miller, TBD, Fax number [phone]    Chart reviewed as part of pre-operative protocol coverage. Given past medical  history and time since last visit, based on ACC/AHA guidelines, Kaori Jumper would be at acceptable risk for the planned procedure without further cardiovascular testing.   Patient was advised that if she develops new symptoms prior to surgery to contact our office to arrange a follow-up appointment.  He verbalized understanding.  Her RCRI is a class II risk, 0.9% risk of major cardiac event.  She is able to complete greater than 4 METS of physical activity.  I will route this recommendation to the requesting party via Epic fax function and remove from pre-op pool.     Jossie Ng. Delaney Perona NP-C     03/29/2022, 1:54 PM [facility] [place] [address] Office [phone] Fax [phone]  Notice: This dictation was prepared with Dragon dictation along with smaller phrase technology. Any transcriptional errors that result from this process are unintentional and may not be corrected upon review.  I spent 14 minutes examining this patient, reviewing medications, and using patient centered shared decision making involving her cardiac care.  Prior to her visit I spent greater than 20 minutes reviewing her past medical history,  medications, and prior cardiac tests.

## 2022-03-29 ENCOUNTER — Ambulatory Visit: Payer: PPO | Attending: General Practice | Admitting: General Practice

## 2022-03-29 ENCOUNTER — Encounter: Payer: Self-pay | Admitting: General Practice

## 2022-03-29 VITALS — BP 130/58 | HR 70 | Ht 64.0 in | Wt 180.8 lb

## 2022-03-29 DIAGNOSIS — E782 Mixed hyperlipidemia: Secondary | ICD-10-CM | POA: Diagnosis not present

## 2022-03-29 DIAGNOSIS — I10 Essential (primary) hypertension: Secondary | ICD-10-CM

## 2022-03-29 DIAGNOSIS — Z952 Presence of prosthetic heart valve: Secondary | ICD-10-CM | POA: Diagnosis not present

## 2022-03-29 DIAGNOSIS — Z01810 Encounter for preprocedural cardiovascular examination: Secondary | ICD-10-CM

## 2022-03-29 NOTE — Patient Instructions (Signed)
Medication Instructions:   The current medical regimen is effective;  continue present plan and medications as directed. Please refer to the Current Medication list given to you today. *If you need a refill on your cardiac medications before your next appointment, please call your pharmacy*  Lab Work: NONE If you have labs (blood work) drawn today and your tests are completely normal, you will receive your results only by:  [place] (if you have MyChart) OR  A paper copy in the mail If you have any lab test that is abnormal or we need to change your treatment, we will call you to review the results.  Follow-Up: At [facility], you and your health needs are our priority.  As part of our continuing mission to provide you with exceptional heart care, we have created designated Provider Care Teams.  These Care Teams include your primary Cardiologist (physician) and Advanced Practice Providers (APPs -  Physician Assistants and Nurse Practitioners) who all work together to provide you with the care you need, when you need it.  We recommend signing up for the patient portal called "MyChart".  Sign up information is provided on this After Visit Summary.  MyChart is used to connect with patients for Virtual Visits (Telemedicine).  Patients are able to view lab/test results, encounter notes, upcoming appointments, etc.  Non-urgent messages can be sent to your provider as well.   To learn more about what you can do with MyChart, go to [email or web address].    Your next appointment:   KEEP SCHEDULED APPOINTMENT   The format for your next appointment:   In Person  Provider:   Glenetta Hew, MD    Other Instructions PLEASE READ AND FOLLOW ATTACHED SALTY 6  [place] [employer]

## 2022-03-30 ENCOUNTER — Ambulatory Visit (INDEPENDENT_AMBULATORY_CARE_PROVIDER_SITE_OTHER): Payer: PPO | Admitting: Family Medicine

## 2022-03-30 ENCOUNTER — Encounter: Payer: Self-pay | Admitting: Family Medicine

## 2022-03-30 VITALS — BP 130/64 | HR 66 | Temp 97.7°F | Ht 64.0 in | Wt 179.6 lb

## 2022-03-30 DIAGNOSIS — Z23 Encounter for immunization: Secondary | ICD-10-CM

## 2022-03-30 DIAGNOSIS — I10 Essential (primary) hypertension: Secondary | ICD-10-CM

## 2022-03-30 DIAGNOSIS — Z01818 Encounter for other preprocedural examination: Secondary | ICD-10-CM | POA: Insufficient documentation

## 2022-03-30 NOTE — Progress Notes (Unsigned)
Subjective:    Patient ID: Veronica Greene, female    DOB: Nov 11, 1945, 76 y.o.   MRN: [id number]  HPI Pt presents for surgical clearance for R TKA Also contusion from a fall    Wt Readings from Last 3 Encounters:  03/30/22 179 lb 9.6 oz (81.5 kg)  03/29/22 180 lb 12.8 oz (82 kg)  01/23/22 177 lb 9.6 oz (80.6 kg)   30.83 kg/m  R TKA planned  Not scheduled yet  Pain every day  Takes tylenol and meloxicam  No blood thinners but does take 1 asa daily and meloxicam   Likely spinal anesthesia   No trouble with anesthesia in the past   Nausea with codeine in the past  Does ok with everything else   Takes asa 325 mg every day   Past h/o heart valve surgery reassuring echo in nov 2022 Cardiology visit yesterday   HTN is well controlled  BP Readings from Last 3 Encounters:  03/30/22 130/64  03/29/22 (!) 130/58  01/23/22 (!) 142/88    Amlodipine 10 -not taking  Hctz 25 mg  Toprol xl 25 mg  Furosemide 20 mg three days weekly  Pulse Readings from Last 3 Encounters:  03/30/22 66  03/29/22 70  01/23/22 72   Never smoked   Has had multiple surgeries in the past   Takes meloxicam   Lab Results  Component Value Date   CREATININE 0.64 11/23/2021   BUN 10 11/23/2021   NA 142 11/23/2021   K 4.0 11/23/2021   CL 104 11/23/2021   CO2 30 11/23/2021   Gfr 85 Lab Results  Component Value Date   ALT 15 11/23/2021   AST 18 11/23/2021   ALKPHOS 53 11/23/2021   BILITOT 0.8 11/23/2021   Lab Results  Component Value Date   WBC 4.0 11/23/2021   HGB 12.5 11/23/2021   HCT 36.0 11/23/2021   MCV 88.8 11/23/2021   PLT 197.0 11/23/2021   No history of a blood transfusion   Allergic to pcn and clarithromycin  Had a fall a month ago- very bruised in L leg and L buttock  Still a little sore spot on L buttock  She went to the chiropractor who though she was fine   Flu shot - today   RSV -interested in vaccine   Lab Results  Component Value Date   HGBA1C 5.1  04/24/2019   Immunization History  Administered Date(s) Administered   Fluad Quad(high Dose 65+) 05/06/2019, 05/04/2020   Influenza Split 04/08/2008, 06/30/2009   Influenza Whole 04/08/2008, 06/30/2009   Influenza, High Dose Seasonal PF 05/17/2015, 05/07/2016, 05/07/2017, 04/22/2018, 05/06/2019, 05/04/2020, 05/04/2021   Influenza-Unspecified 05/09/2014, 05/17/2015, 05/07/2016, 05/07/2017, 04/22/2018, 05/04/2021   PFIZER Comirnaty(Gray Top)Covid-19 Tri-Sucrose Vaccine 07/30/2019, 08/20/2019, 04/23/2020   PFIZER(Purple Top)SARS-COV-2 Vaccination 07/30/2019, 08/20/2019, 04/23/2020   Pneumococcal Conjugate-13 10/12/2014   Pneumococcal Polysaccharide-23 08/25/2010   Pneumococcal-Unspecified 08/25/2010   Td 10/15/2003   Td,absorbed, Preservative Free, Adult Use, Lf Unspecified 10/15/2003   Tdap 06/18/2018   Zoster, Live 08/12/2008    Patient Active Problem List   Diagnosis Date Noted   Pre-op examination 03/30/2022   Toe pain 11/30/2021   Laryngopharyngeal reflux 11/20/2021   Tinnitus of both ears 08/18/2020   Itching of ear 08/18/2020   Multiple falls 03/30/2020   Lower extremity edema 02/21/2020   S/P TAVR (transcatheter aortic valve replacement)    Poor balance 11/25/2018   History of endometrial cancer 09/12/2017   Essential hypertension 08/29/2017   Severe calcific aortic stenosis  08/27/2017   Impacted cerumen of left ear 02/27/2017   Estrogen deficiency 11/05/2016   Routine general medical examination at a health care facility 10/15/2015   Hyperlipidemia with target LDL less than 100 09/02/2012   Degenerative disk disease 08/31/2011   Personal history of colonic polyps 11/03/2010   Gout 08/25/2010   DERMATITIS, SEBORRHEIC 08/25/2010   Osteopenia 09/05/2007   History of poliomyelitis 08/26/2007   Obesity 08/26/2007   Anxiety and depression 08/26/2007   ALLERGIC RHINITIS 08/26/2007   GERD 08/26/2007   IBS 08/26/2007   OVERACTIVE BLADDER 08/26/2007   OSTEOARTHRITIS,  HANDS, BILATERAL 08/26/2007   URINARY INCONTINENCE 08/26/2007   Past Medical History:  Diagnosis Date   Anxiety and depression 08/26/2007   Qualifier: Diagnosis of  By: Glori Bickers MD, Carmell Austria    Arthritis    knees   Constipation, slow transit 11/03/2010   Degenerative disk disease 08/31/2011   Depression with anxiety    DERMATITIS, SEBORRHEIC 08/25/2010   Qualifier: Diagnosis of  By: Glori Bickers MD, Carmell Austria    Endometrial cancer [facility])    02/ 2019  Diagnosed with D&C/hysteroscopy with polypectomy   Essential hypertension 08/29/2017   Fibromyalgia    tx mobic   Full dentures    GERD 08/26/2007   Qualifier: Diagnosis of  By: Glori Bickers MD, Carmell Austria    Gout    Hearing loss    wears bilateral hearing aids   History of colon polyps    History of nonmelanoma skin cancer    right lower leg   History of poliomyelitis 08/26/2007   Qualifier: History of  By: Glori Bickers MD, Carmell Austria    History of radiation therapy    Endometium- 11/20/17-12/11/17- Vag Cuff -Dr. Gery Pray   Hyperlipidemia, mild 09/02/2012   Hypertension    Hypokalemia 01/02/2011   IBS (irritable bowel syndrome)    with constipation   Obesity 08/26/2007   Qualifier: Diagnosis of  By: Glori Bickers MD, Carmell Austria    OSTEOARTHRITIS, HANDS, BILATERAL 08/26/2007   Qualifier: Diagnosis of  By: Glori Bickers MD, Carmell Austria    Osteopenia    OVERACTIVE BLADDER 08/26/2007   Qualifier: Diagnosis of  By: Glori Bickers MD, Carmell Austria    Personal history of colonic polyps 11/03/2010   05/2018 13 polyps mostly adenomas, ssp's - recall 1 year 2020 Gatha Mayer, MD, [facility]    Poor balance 11/25/2018   With falls  Has rise in R shoe/post polio  Weak legs as well     S/P TAVR (transcatheter aortic valve replacement)    s/p TAVR with a 38m Edwards S3U via the TF approach by Dr. BCyndia Bentand Dr. MAngelena Form  Severe calcific aortic stenosis 08/27/2017    -> Became severe as of October 2020-referred for TAVR on 04/28/2019   URINARY INCONTINENCE 08/26/2007   Qualifier:  Diagnosis of  By: TGlori BickersMD, MCarmell Austria   Wears hearing aid in both ears    Past Surgical History:  Procedure Laterality Date   ANKLE SURGERY Right [place] 11[place]16[place]   COLONOSCOPY N/A 06/24/2015   Procedure: COLONOSCOPY;  Surgeon: CGatha Mayer MD;  Location: [facility];  Service: Endoscopy;  Laterality: N/A;   DILATATION & CURETTAGE/HYSTEROSCOPY WITH MYOSURE N/A 08/09/2017   Procedure: DILATATION & CURETTAGE/HYSTEROSCOPY WITH MYOSURE;  Surgeon: CChristophe Louis MD;  [facility];  Service: Gynecology;  Laterality: N/A;  Polypectomy   DILATION AND CURETTAGE OF UTERUS  PMB   HAND SURGERY Bilateral 1999   Thumbs    KNEE SURGERY Bilateral 1998   x2   LYMPH NODE BIOPSY N/A 10/08/2017   Procedure: LYMPH NODE BIOPSY;  Surgeon: [name], MD;  Location: [facility];  Service: Gynecology;  Laterality: N/A;   MULTIPLE TOOTH EXTRACTIONS     NASAL SINUS SURGERY  1976   polio surgery.     right foot - right leg 1.5" shorter than left leg - some wekness   RIGHT/LEFT HEART CATH AND CORONARY ANGIOGRAPHY N/A 04/09/2019   Procedure: RIGHT/LEFT HEART CATH AND CORONARY ANGIOGRAPHY;  Surgeon: Leonie Man, MD;  Location: [place] [facility];  Service: Cardiovascular;; Severe AS, mean gradient 50 mmHg P-P 55 mmHg (estimated AVA 0.76 cm).  Mildly elevated pulmonary pressures with elevated LVEDP and PCWP.  Angiographically normal coronary arteries, but tortuous.   ROBOTIC ASSISTED TOTAL HYSTERECTOMY WITH BILATERAL SALPINGO OOPHERECTOMY Bilateral 10/08/2017   Procedure: XI ROBOTIC ASSISTED TOTAL HYSTERECTOMY WITH BILATERAL SALPINGO OOPHORECTOMY;  Surgeon: [name], MD;  Location: [facility];  Service: Gynecology;  Laterality: Bilateral;   TEE WITHOUT CARDIOVERSION N/A 04/28/2019   Procedure: TRANSESOPHAGEAL ECHOCARDIOGRAM (TEE);  Surgeon: Burnell Blanks, MD;  Location: [place] [facility];  Service: Open Heart Surgery;  Laterality:  N/A;   THUMB ARTHROSCOPY  1999   TONSILLECTOMY  5337   76 years old   TRANSCATHETER AORTIC VALVE REPLACEMENT, TRANSFEMORAL N/A 04/28/2019   Procedure: TRANSCATHETER AORTIC VALVE REPLACEMENT, TRANSFEMORAL;  Surgeon: Burnell Blanks, MD;  Location: [place] [facility]; ; 23 mm Sapien 3 Ultra THV via TF approach. (post-op mean AV gradient 13 mmHg)   TRANSTHORACIC ECHOCARDIOGRAM  05/04/2020   EF 60 to 65%.  Normal LV function and wall motion.  Indeterminate diastolic parameters.  Normal RV size and pressure.  Moderate LA dilation.  Moderate MAC but no significant MR.  Post TAVR with 23 mm Sapien 3 valve: Mean gradient 14 mmHg.  No PVL.  Normal IVC   TRANSTHORACIC ECHOCARDIOGRAM  02/17/2019   Mod LVH. GR 2 DD. Severe AS (mean gradient = 44 mmHg with Mod AI. mild MS.   TRANSTHORACIC ECHOCARDIOGRAM  04/29/2019; 05/2020   a)  (post TAVR): EF 60-65%.  GRII DD.  Mod LA dilation & nl RA.  Mod MAC.  No  AI (no-PVL), mean AV gradient 13 mmHg.;; b) (2nd post TAVR) EF 60 to 65%.  Severely increased thickness/LVH.  GRII DD with only mildly elevated left atrial pressure.  Normal RA size.  Severe MAC with only trace MR.  No MS.  TAVR valve prosthesis functioning well.  Trivial AI.  Mean gradient 15 mmHg with a peak 29.8 mmHg   TUBAL LIGATION     UPPER GI ENDOSCOPY     normal   WISDOM TOOTH EXTRACTION     Social History   Tobacco Use   Smoking status: Never   Smokeless tobacco: Never  Vaping Use   Vaping Use: Never used  Substance Use Topics   Alcohol use: No    Alcohol/week: 0.0 standard drinks of alcohol   Drug use: No   Family History  Problem Relation Age of Onset   Kidney disease Mother    Alzheimer's disease Mother    Stroke Father 32   Colon cancer Father 24       possibly colon cancer, possibly just polyps   Kidney disease Sister    Heart attack Sister    Colon cancer Paternal Aunt 42   Cancer Paternal Uncle  unk type   Esophageal cancer Neg Hx    Stomach cancer Neg Hx     Rectal cancer Neg Hx    Breast cancer Neg Hx    Ovarian cancer Neg Hx    Endometrial cancer Neg Hx    Pancreatic cancer Neg Hx    Prostate cancer Neg Hx    Allergies  Allergen Reactions   Clarithromycin Swelling and Other (See Comments)    Throat Swells   Penicillins Hives and Other (See Comments)    WELTS Has patient had a PCN reaction causing immediate rash, facial/tongue/throat swelling, SOB or lightheadedness with hypotension: No Has patient had a PCN reaction causing severe rash involving mucus membranes or skin necrosis: Unknown Has patient had a PCN reaction that required hospitalization:No Has patient had a PCN reaction occurring within the last 10 years: No If all of the above answers are "NO", then may proceed with Cephalosporin use.    Codeine Nausea And Vomiting   Current Outpatient Medications on File Prior to Visit  Medication Sig Dispense Refill   acetaminophen (TYLENOL) 325 MG tablet Take 650 mg by mouth every 6 (six) hours as needed (for pain.).     acetic acid-hydrocortisone (VOSOL-HC) OTIC solution Place 2 drops into both ears 2 (two) times daily as needed (itching).     Ascorbic Acid (VITAMIN C) 1000 MG tablet Take 1,000 mg by mouth daily with breakfast.     Black Pepper-Turmeric (TURMERIC COMPLEX/BLACK PEPPER PO) Take 15 mLs by mouth daily with breakfast.      Calcium-Phosphorus-Vitamin D (CALCIUM/VITAMIN D3/ADULT GUMMY PO) Take 1 tablet by mouth 2 (two) times daily.      cetirizine (ZYRTEC) 10 MG tablet Take 5 mg by mouth as needed for allergies.     cholecalciferol (VITAMIN D) 1000 UNITS tablet Take 1,000 Units by mouth daily with breakfast.      fluticasone (FLONASE) 50 MCG/ACT nasal spray Place 2 sprays into both nostrils at bedtime.      furosemide (LASIX) 20 MG tablet MAY TAKE 1 TABLET (20 MG TOTAL) UP TO THREE TIMES A WEEK. DO NOT TAKE HCTZ THAT DAY. 30 tablet 11   hydrochlorothiazide (HYDRODIURIL) 25 MG tablet Take 1 tablet (25 mg total) by mouth daily  with lunch. 90 tablet 3   lubiprostone (AMITIZA) 24 MCG capsule Take 1 capsule (24 mcg total) by mouth 2 (two) times daily. LUNCH & SUPPER 180 capsule 3   meclizine (ANTIVERT) 25 MG tablet Take 0.5-1 tablets (12.5-25 mg total) by mouth 3 (three) times daily as needed for dizziness. Caution of sedation 20 tablet 0   meloxicam (MOBIC) 7.5 MG tablet TAKE 1 TABLET BY MOUTH AT BEDTIME. 90 tablet 2   metoprolol succinate (TOPROL-XL) 25 MG 24 hr tablet Take 1 tablet (25 mg total) by mouth daily. 90 tablet 3   NEXIUM 40 MG capsule 1 Add'l Sig oral Select Frequency     Polyethyl Glycol-Propyl Glycol 0.4-0.3 % SOLN Place 1-2 drops into both eyes 2 (two) times daily.      potassium chloride (KLOR-CON) 10 MEQ tablet Take 2 tablets (20 mEq total) by mouth 2 (two) times daily. 360 tablet 3   Simethicone (PHAZYME PO) Take 1 tablet by mouth at bedtime as needed (for gas).     sodium chloride (OCEAN) 0.65 % nasal spray Place 1 spray into the nose daily.     triamcinolone (KENALOG) 0.1 % Apply topically.     venlafaxine XR (EFFEXOR XR) 75 MG 24 hr capsule Take 1  capsule (75 mg total) by mouth daily after breakfast. 90 capsule 3   amLODipine (NORVASC) 10 MG tablet Take 1 tablet (10 mg total) by mouth daily. 180 tablet 3   No current facility-administered medications on file prior to visit.    Review of Systems  Constitutional:  Negative for activity change, appetite change, fatigue, fever and unexpected weight change.  HENT:  Negative for congestion, ear pain, rhinorrhea, sinus pressure and sore throat.   Eyes:  Negative for pain, redness and visual disturbance.  Respiratory:  Negative for cough, shortness of breath and wheezing.   Cardiovascular:  Negative for chest pain and palpitations.  Gastrointestinal:  Negative for abdominal pain, blood in stool, constipation and diarrhea.  Endocrine: Negative for polydipsia and polyuria.  Genitourinary:  Negative for dysuria, frequency and urgency.  Musculoskeletal:   Positive for arthralgias. Negative for back pain and myalgias.  Skin:  Negative for pallor and rash.  Allergic/Immunologic: Negative for environmental allergies.  Neurological:  Negative for dizziness, syncope and headaches.  Hematological:  Negative for adenopathy. Does not bruise/bleed easily.  Psychiatric/Behavioral:  Negative for decreased concentration and dysphoric mood. The patient is not nervous/anxious.        Objective:   Physical Exam Constitutional:      General: She is not in acute distress.    Appearance: Normal appearance. She is well-developed. She is obese. She is not ill-appearing or diaphoretic.  HENT:     Head: Normocephalic and atraumatic.     Mouth/Throat:     Mouth: Mucous membranes are moist.     Pharynx: No posterior oropharyngeal erythema.  Eyes:     Conjunctiva/sclera: Conjunctivae normal.     Pupils: Pupils are equal, round, and reactive to light.  Neck:     Thyroid: No thyromegaly.     Vascular: No carotid bruit or JVD.  Cardiovascular:     Rate and Rhythm: Normal rate and regular rhythm.     Heart sounds: Normal heart sounds.     No gallop.  Pulmonary:     Effort: Pulmonary effort is normal. No respiratory distress.     Breath sounds: Normal breath sounds. No wheezing or rales.  Abdominal:     General: There is no distension or abdominal bruit.     Palpations: Abdomen is soft.  Musculoskeletal:     Cervical back: Normal range of motion and neck supple.     Right lower leg: No edema.     Left lower leg: No edema.     Comments: Baseline leg length discrepency   Lymphadenopathy:     Cervical: No cervical adenopathy.  Skin:    General: Skin is warm and dry.     Coloration: Skin is not pale.     Findings: No rash.  Neurological:     Mental Status: She is alert.     Cranial Nerves: No cranial nerve deficit.     Motor: No weakness.     Coordination: Coordination normal.     Deep Tendon Reflexes: Reflexes are normal and symmetric. Reflexes  normal.  Psychiatric:        Mood and Affect: Mood normal.           Assessment & Plan:   Problem List Items Addressed This Visit       Cardiovascular and Mediastinum   Essential hypertension (Chronic)    bp in fair control at this time  BP Readings from Last 1 Encounters:  03/30/22 130/64  No changes needed Most recent labs  reviewed  Disc lifstyle change with low sodium diet and exercise  Plan to continue Amlodipine 10 -not taking  Hctz 25 mg  Toprol xl 25 mg  Furosemide 20 mg three days weekly        Other   Pre-op examination - Primary    For medical clearance for upcoming R TKA Has had cardiac clearance and last echo was reassuring Most likely spinal anesthesia  No past problem with anesthesia  Has has nausea with codeine inst to hold meloxicam pre surgery per ortho orders  Hold asa per cardiology orders  bp is well controlled Flu shot given        Other Visit Diagnoses     Need for influenza vaccination       Relevant Orders   Flu Vaccine QUAD High Dose(Fluad) (Completed)

## 2022-03-30 NOTE — Patient Instructions (Addendum)
You will need to hold the aspirin and meloxicam before surgery  The surgeon will tell you for how long   Take care of yourself   Flu shot today   I do recommend the flu shot   Get your covid booster if you can and if it is affordable   RSV booster is up to you

## 2022-04-01 NOTE — Assessment & Plan Note (Signed)
For medical clearance for upcoming R TKA Has had cardiac clearance and last echo was reassuring Most likely spinal anesthesia  No past problem with anesthesia  Has has nausea with codeine inst to hold meloxicam pre surgery per ortho orders  Hold asa per cardiology orders  bp is well controlled Flu shot given

## 2022-04-01 NOTE — Assessment & Plan Note (Signed)
bp in fair control at this time  BP Readings from Last 1 Encounters:  03/30/22 130/64   No changes needed Most recent labs reviewed  Disc lifstyle change with low sodium diet and exercise  Plan to continue Amlodipine 10 -not taking  Hctz 25 mg  Toprol xl 25 mg  Furosemide 20 mg three days weekly

## 2022-04-02 ENCOUNTER — Telehealth: Payer: Self-pay | Admitting: Family Medicine

## 2022-04-02 NOTE — Telephone Encounter (Signed)
Pt stated she has a spot on her right leg, in the front. Pt stated she's suppose to go into surgery soon once everything gets approved (spoke to tower during last appt). Pt was told she would not be able to go into surgery with a open wound. Pt needs some advice on what to do about wound. Pt stated she has had pollio in the past. Wound isn't looking any better, even after applying ointment & peroxide. Pt stated she spoke to tower about this during appt. Call back # [phone]

## 2022-04-03 MED ORDER — MUPIROCIN 2 % EX OINT: 1.0000 | TOPICAL_OINTMENT | Freq: Two times a day (BID) | CUTANEOUS | 0 refills | Status: DC

## 2022-04-03 NOTE — Telephone Encounter (Signed)
Looked in last office note did not see anything about it. Do you need to see in office?

## 2022-04-03 NOTE — Telephone Encounter (Signed)
Do not use peroxide-it slows down healing   For now use soap and water to cleanse  Watch for redness/swelling/streaking or signs of infection   I sent bactroban ointment to your pharmacy to use bid   Cannot do much more to get it to heal faster and it will be up to the surgeon how long to wait before surgery   If worse or s/s of infection please f/u so we can re eval

## 2022-04-03 NOTE — Telephone Encounter (Signed)
Spoke to patient she will start on bactroban today. She will let us know if any s/s of infections. She is aware that the decision about surgery will be made by surgeon.  No further action needed at this time.

## 2022-04-06 ENCOUNTER — Ambulatory Visit
Admission: EM | Admit: 2022-04-06 | Discharge: 2022-04-06 | Disposition: A | Discharge status: Home / Self Care | Payer: PPO | Attending: Physician Assistant | Admitting: Physician Assistant

## 2022-04-06 DIAGNOSIS — L03115 Cellulitis of right lower limb: Secondary | ICD-10-CM | POA: Diagnosis not present

## 2022-04-06 MED ORDER — DOXYCYCLINE HYCLATE 100 MG PO CAPS: 100.0000 mg | ORAL_CAPSULE | Freq: Two times a day (BID) | ORAL | 0 refills | Status: DC

## 2022-04-06 NOTE — ED Provider Notes (Signed)
[facility]    CSN: [id number] Arrival date & time: 04/06/22  1147      History   Chief Complaint Chief Complaint  Patient presents with   leg wound    HPI Veronica Greene is a 76 y.o. female.   Patient here today for evaluation of a wound to her right anterior shin that is been present for 3 weeks.  She reports that last night she started to have more redness distal to the wound which concerned her.  She has been using a topical antibacterial ointment without significant relief.  She has not had any fever.  The history is provided by the patient.    Past Medical History:  Diagnosis Date   Anxiety and depression 08/26/2007   Qualifier: Diagnosis of  By: Glori Bickers MD, Carmell Austria    Arthritis    knees   Constipation, slow transit 11/03/2010   Degenerative disk disease 08/31/2011   Depression with anxiety    DERMATITIS, SEBORRHEIC 08/25/2010   Qualifier: Diagnosis of  By: Glori Bickers MD, Carmell Austria    Endometrial cancer [facility])    02/ 2019  Diagnosed with D&C/hysteroscopy with polypectomy   Essential hypertension 08/29/2017   Fibromyalgia    tx mobic   Full dentures    GERD 08/26/2007   Qualifier: Diagnosis of  By: Glori Bickers MD, Carmell Austria    Gout    Hearing loss    wears bilateral hearing aids   History of colon polyps    History of nonmelanoma skin cancer    right lower leg   History of poliomyelitis 08/26/2007   Qualifier: History of  By: Glori Bickers MD, Carmell Austria    History of radiation therapy    Endometium- 11/20/17-12/11/17- Vag Cuff -Dr. Gery Pray   Hyperlipidemia, mild 09/02/2012   Hypertension    Hypokalemia 01/02/2011   IBS (irritable bowel syndrome)    with constipation   Obesity 08/26/2007   Qualifier: Diagnosis of  By: Glori Bickers MD, Carmell Austria    OSTEOARTHRITIS, HANDS, BILATERAL 08/26/2007   Qualifier: Diagnosis of  By: Glori Bickers MD, Carmell Austria    Osteopenia    OVERACTIVE BLADDER 08/26/2007   Qualifier: Diagnosis of  By: Glori Bickers MD, Carmell Austria    Personal  history of colonic polyps 11/03/2010   05/2018 13 polyps mostly adenomas, ssp's - recall 1 year 2020 Gatha Mayer, MD, [facility]    Poor balance 11/25/2018   With falls  Has rise in R shoe/post polio  Weak legs as well     S/P TAVR (transcatheter aortic valve replacement)    s/p TAVR with a 57m Edwards S3U via the TF approach by Dr. BCyndia Bentand Dr. MAngelena Form  Severe calcific aortic stenosis 08/27/2017    -> Became severe as of October 2020-referred for TAVR on 04/28/2019   URINARY INCONTINENCE 08/26/2007   Qualifier: Diagnosis of  By: TGlori BickersMD, MCarmell Austria   Wears hearing aid in both ears     Patient Active Problem List   Diagnosis Date Noted   Pre-op examination 03/30/2022   Toe pain 11/30/2021   Laryngopharyngeal reflux 11/20/2021   Tinnitus of both ears 08/18/2020   Itching of ear 08/18/2020   Multiple falls 03/30/2020   Lower extremity edema 02/21/2020   S/P TAVR (transcatheter aortic valve replacement)    Poor balance 11/25/2018   History of endometrial cancer 09/12/2017   Essential hypertension 08/29/2017   Severe calcific aortic stenosis 08/27/2017   Impacted cerumen of left ear 02/27/2017  Estrogen deficiency 11/05/2016   Routine general medical examination at a health care facility 10/15/2015   Hyperlipidemia with target LDL less than 100 09/02/2012   Degenerative disk disease 08/31/2011   Personal history of colonic polyps 11/03/2010   Gout 08/25/2010   DERMATITIS, SEBORRHEIC 08/25/2010   Osteopenia 09/05/2007   History of poliomyelitis 08/26/2007   Obesity 08/26/2007   Anxiety and depression 08/26/2007   ALLERGIC RHINITIS 08/26/2007   GERD 08/26/2007   IBS 08/26/2007   OVERACTIVE BLADDER 08/26/2007   OSTEOARTHRITIS, HANDS, BILATERAL 08/26/2007   URINARY INCONTINENCE 08/26/2007    Past Surgical History:  Procedure Laterality Date   ANKLE SURGERY [place] Bilateral 1986, 1993   CATARACT EXTRACTION Right 11/16/2019   COLONOSCOPY N/A  06/24/2015   Procedure: COLONOSCOPY;  Surgeon: Gatha Mayer, MD;  Location: [facility];  Service: Endoscopy;  Laterality: N/A;   DILATATION & CURETTAGE/HYSTEROSCOPY WITH MYOSURE N/A 08/09/2017   Procedure: DILATATION & CURETTAGE/HYSTEROSCOPY WITH MYOSURE;  Surgeon: Christophe Louis, MD;  Location: [place] [facility];  Service: Gynecology;  Laterality: N/A;  Polypectomy   DILATION AND CURETTAGE OF UTERUS     PMB   HAND SURGERY Bilateral 1999   Thumbs    KNEE SURGERY Bilateral 1998   x2   LYMPH NODE BIOPSY N/A 10/08/2017   Procedure: LYMPH NODE BIOPSY;  Surgeon: [name], MD;  Location: [facility];  Service: Gynecology;  Laterality: N/A;   MULTIPLE TOOTH EXTRACTIONS     NASAL SINUS SURGERY  1976   polio surgery.     right foot - right leg 1.5" shorter than left leg - some wekness   RIGHT/LEFT HEART CATH AND CORONARY ANGIOGRAPHY N/A 04/09/2019   Procedure: RIGHT/LEFT HEART CATH AND CORONARY ANGIOGRAPHY;  Surgeon: Leonie Man, MD;  Location: [place] [facility];  Service: Cardiovascular;; Severe AS, mean gradient 50 mmHg P-P 55 mmHg (estimated AVA 0.76 cm).  Mildly elevated pulmonary pressures with elevated LVEDP and PCWP.  Angiographically normal coronary arteries, but tortuous.   ROBOTIC ASSISTED TOTAL HYSTERECTOMY WITH BILATERAL SALPINGO OOPHERECTOMY Bilateral 10/08/2017   Procedure: XI ROBOTIC ASSISTED TOTAL HYSTERECTOMY WITH BILATERAL SALPINGO OOPHORECTOMY;  Surgeon: [name], MD;  Location: [facility];  Service: Gynecology;  Laterality: Bilateral;   TEE WITHOUT CARDIOVERSION N/A 04/28/2019   Procedure: TRANSESOPHAGEAL ECHOCARDIOGRAM (TEE);  Surgeon: Burnell Blanks, MD;  Location: [place] [facility];  Service: Open Heart Surgery;  Laterality: N/A;   THUMB ARTHROSCOPY  1999   TONSILLECTOMY  8458   76 years old   TRANSCATHETER AORTIC VALVE REPLACEMENT, TRANSFEMORAL N/A 04/28/2019   Procedure: TRANSCATHETER AORTIC VALVE REPLACEMENT, TRANSFEMORAL;  Surgeon: Burnell Blanks, MD;  Location: [place]  [facility]; ; 23 mm Sapien 3 Ultra THV via TF approach. (post-op mean AV gradient 13 mmHg)   TRANSTHORACIC ECHOCARDIOGRAM  05/04/2020   EF 60 to 65%.  Normal LV function and wall motion.  Indeterminate diastolic parameters.  Normal RV size and pressure.  Moderate LA dilation.  Moderate MAC but no significant MR.  Post TAVR with 23 mm Sapien 3 valve: Mean gradient 14 mmHg.  No PVL.  Normal IVC   TRANSTHORACIC ECHOCARDIOGRAM  02/17/2019   Mod LVH. GR 2 DD. Severe AS (mean gradient = 44 mmHg with Mod AI. mild MS.   TRANSTHORACIC ECHOCARDIOGRAM  04/29/2019; 05/2020   a)  (post TAVR): EF 60-65%.  GRII DD.  Mod LA dilation & nl RA.  Mod MAC.  No  AI (no-PVL), mean AV gradient 13  mmHg.;; b) (2nd post TAVR) EF 60 to 65%.  Severely increased thickness/LVH.  GRII DD with only mildly elevated left atrial pressure.  Normal RA size.  Severe MAC with only trace MR.  No MS.  TAVR valve prosthesis functioning well.  Trivial AI.  Mean gradient 15 mmHg with a peak 29.8 mmHg   TUBAL LIGATION     UPPER GI ENDOSCOPY     normal   WISDOM TOOTH EXTRACTION      OB History   No obstetric history on file.      Home Medications    Prior to Admission medications   Medication Sig Start Date End Date Taking? Authorizing Provider  doxycycline (VIBRAMYCIN) 100 MG capsule Take 1 capsule (100 mg total) by mouth 2 (two) times daily. 04/06/22  Yes Francene Finders, PA-C  acetaminophen (TYLENOL) 325 MG tablet Take 650 mg by mouth every 6 (six) hours as needed (for pain.).    [provider]  acetic acid-hydrocortisone (VOSOL-HC) OTIC solution Place 2 drops into both ears 2 (two) times daily as needed (itching).    [provider]  amLODipine (NORVASC) 10 MG tablet Take 1 tablet (10 mg total) by mouth daily. 05/01/21 03/29/22  Leonie Man, MD  Ascorbic Acid (VITAMIN C) 1000 MG tablet Take 1,000 mg by mouth daily with breakfast.    [provider]  Black Pepper-Turmeric (TURMERIC COMPLEX/BLACK  PEPPER PO) Take 15 mLs by mouth daily with breakfast.     [provider]  Calcium-Phosphorus-Vitamin D (CALCIUM/VITAMIN D3/ADULT GUMMY PO) Take 1 tablet by mouth 2 (two) times daily.     [provider]  cetirizine (ZYRTEC) 10 MG tablet Take 5 mg by mouth as needed for allergies.    [provider]  cholecalciferol (VITAMIN D) 1000 UNITS tablet Take 1,000 Units by mouth daily with breakfast.     [provider]  fluticasone (FLONASE) 50 MCG/ACT nasal spray Place 2 sprays into both nostrils at bedtime.     [provider]  furosemide (LASIX) 20 MG tablet MAY TAKE 1 TABLET (20 MG TOTAL) UP TO THREE TIMES A WEEK. DO NOT TAKE HCTZ THAT DAY. 06/20/21   Leonie Man, MD  hydrochlorothiazide (HYDRODIURIL) 25 MG tablet Take 1 tablet (25 mg total) by mouth daily with lunch. 11/30/21   Tower, [name], MD  lubiprostone (AMITIZA) 24 MCG capsule Take 1 capsule (24 mcg total) by mouth 2 (two) times daily. LUNCH & SUPPER 11/30/21   Tower, [name], MD  meclizine (ANTIVERT) 25 MG tablet Take 0.5-1 tablets (12.5-25 mg total) by mouth 3 (three) times daily as needed for dizziness. Caution of sedation 06/15/21   Tower, Roque Lias A, MD  meloxicam (MOBIC) 7.5 MG tablet TAKE 1 TABLET BY MOUTH AT BEDTIME. 02/28/22   Tower, [name], MD  metoprolol succinate (TOPROL-XL) 25 MG 24 hr tablet Take 1 tablet (25 mg total) by mouth daily. 03/02/22   Leonie Man, MD  mupirocin ointment (BACTROBAN) 2 % Apply 1 Application topically 2 (two) times daily. To wound 04/03/22   Tower, [name], MD  NEXIUM 40 MG capsule 1 Add'l Sig oral Select Frequency 10/26/19   [provider]  Polyethyl Glycol-Propyl Glycol 0.4-0.3 % SOLN Place 1-2 drops into both eyes 2 (two) times daily.     [provider]  potassium chloride (KLOR-CON) 10 MEQ tablet Take 2 tablets (20 mEq total) by mouth 2 (two) times daily. 11/30/21   Tower, [name], MD  Simethicone (PHAZYME  PO) Take 1 tablet by mouth at  bedtime as needed (for gas).    [provider]  sodium chloride (OCEAN) 0.65 % nasal spray Place 1 spray into the nose daily.    [provider]  triamcinolone (KENALOG) 0.1 % Apply topically. 04/26/20   [provider]  venlafaxine XR (EFFEXOR XR) 75 MG 24 hr capsule Take 1 capsule (75 mg total) by mouth daily after breakfast. 11/30/21   Tower, [name], MD    Family History Family History  Problem Relation Age of Onset   Kidney disease Mother    Alzheimer's disease Mother    Stroke Father 4   Colon cancer Father 45       possibly colon cancer, possibly just polyps   Kidney disease Sister    Heart attack Sister    Colon cancer Paternal Aunt 60   Cancer Paternal Uncle        unk type   Esophageal cancer Neg Hx    Stomach cancer Neg Hx    Rectal cancer Neg Hx    Breast cancer Neg Hx    Ovarian cancer Neg Hx    Endometrial cancer Neg Hx    Pancreatic cancer Neg Hx    Prostate cancer Neg Hx     Social History Social History   Tobacco Use   Smoking status: Never   Smokeless tobacco: Never  Vaping Use   Vaping Use: Never used  Substance Use Topics   Alcohol use: No    Alcohol/week: 0.0 standard drinks of alcohol   Drug use: No     Allergies   Clarithromycin, Penicillins, and Codeine   Review of Systems Review of Systems  Constitutional:  Negative for chills and fever.  Eyes:  Negative for discharge and redness.  Gastrointestinal:  Negative for abdominal pain, nausea and vomiting.  Skin:  Positive for color change and wound.     Physical Exam Triage Vital Signs ED Triage Vitals  Enc Vitals Group     BP 04/06/22 1203 (!) 149/68     Pulse Rate 04/06/22 1203 70     Resp 04/06/22 1203 16     Temp 04/06/22 1203 98.2 F (36.8 C)     Temp Source 04/06/22 1203 Oral     SpO2 04/06/22 1203 97 %     Weight --      Height --      Head Circumference --      Peak Flow --      Pain Score 04/06/22 1204 6     Pain Loc --      Pain Edu? --       Excl. in [place]? --    No data found.  Updated Vital Signs BP (!) 149/68 (BP Location: Left Arm)   Pulse 70   Temp 98.2 F (36.8 C) (Oral)   Resp 16   SpO2 97%      Physical Exam Vitals and nursing note reviewed.  Constitutional:      General: She is not in acute distress.    Appearance: Normal appearance. She is not ill-appearing.  HENT:     Head: Normocephalic and atraumatic.  Eyes:     Conjunctiva/sclera: Conjunctivae normal.  Cardiovascular:     Rate and Rhythm: Normal rate.  Pulmonary:     Effort: Pulmonary effort is normal. No respiratory distress.  Skin:    Comments: Approximately 1 cm annular wound that is scabbed over to anterior right lower leg with no active  bleeding or drainage.  Minimal swelling with erythema noted distally  Neurological:     Mental Status: She is alert.  Psychiatric:        Mood and Affect: Mood normal.        Behavior: Behavior normal.        Thought Content: Thought content normal.      UC Treatments / Results  Labs (all labs ordered are listed, but only abnormal results are displayed) Labs Reviewed - No data to display  EKG   Radiology No results found.  Procedures Procedures (including critical care time)  Medications Ordered in UC Medications - No data to display  Initial Impression / Assessment and Plan / UC Course  I have reviewed the triage vital signs and the nursing notes.  Pertinent labs & imaging results that were available during my care of the patient were reviewed by me and considered in my medical decision making (see chart for details).    We will treat to cover cellulitis with doxycycline.  Recommended follow-up if no gradual improvement or with any further concerns.  Final Clinical Impressions(s) / UC Diagnoses   Final diagnoses:  Cellulitis of leg, right   Discharge Instructions   None    ED Prescriptions     Medication Sig Dispense Auth. Provider   doxycycline (VIBRAMYCIN) 100 MG capsule  Take 1 capsule (100 mg total) by mouth 2 (two) times daily. 20 capsule Francene Finders, PA-C      PDMP not reviewed this encounter.   Francene Finders, PA-C 04/06/22 1358

## 2022-04-06 NOTE — ED Triage Notes (Signed)
Pt states right lower leg wound with redness for the past 3 weeks states she hit her right shin on the bed. Saw her PMD last week and was given Bactroban states it worked for one day.

## 2022-04-09 ENCOUNTER — Ambulatory Visit: Payer: PPO | Admitting: Family Medicine

## 2022-04-23 ENCOUNTER — Encounter: Payer: Self-pay | Admitting: Family Medicine

## 2022-04-23 ENCOUNTER — Ambulatory Visit (INDEPENDENT_AMBULATORY_CARE_PROVIDER_SITE_OTHER): Payer: PPO | Admitting: Family Medicine

## 2022-04-23 VITALS — BP 140/64 | HR 78 | Temp 97.6°F | Wt 180.1 lb

## 2022-04-23 DIAGNOSIS — S81802A Unspecified open wound, left lower leg, initial encounter: Secondary | ICD-10-CM | POA: Diagnosis not present

## 2022-04-23 DIAGNOSIS — R21 Rash and other nonspecific skin eruption: Secondary | ICD-10-CM | POA: Insufficient documentation

## 2022-04-23 DIAGNOSIS — L03115 Cellulitis of right lower limb: Secondary | ICD-10-CM | POA: Insufficient documentation

## 2022-04-23 MED ORDER — TRIAMCINOLONE ACETONIDE 0.1 % EX CREA: TOPICAL_CREAM | Freq: Two times a day (BID) | CUTANEOUS | 0 refills | Status: AC

## 2022-04-23 NOTE — Patient Instructions (Addendum)
Keep wound on left leg clean with soap and water  If it looks red or swollen let us know  Other leg looks good  No reason to postpone surgery any more  Use triamcinolone cream as directed on your neck  Let us know if it does not improve  Avoid any new products on your neck   Let us know if it does not improve

## 2022-04-23 NOTE — Assessment & Plan Note (Signed)
Mild rash on back of neck May have reacted to product at the chiropractor   Refilled triamcinolone cream 0.1 % to use bid prn  Update if not starting to improve in a week or if worsening

## 2022-04-23 NOTE — Progress Notes (Signed)
Subjective:    Patient ID: Veronica Greene, female    DOB: 1945/12/01, 76 y.o.   MRN: [id number]  HPI Pt presents with a rash and re check leg  Wt Readings from Last 3 Encounters:  04/23/22 180 lb 2 oz (81.7 kg)  03/30/22 179 lb 9.6 oz (81.5 kg)  03/29/22 180 lb 12.8 oz (82 kg)   30.92 kg/m   Last visit did surg clearance for R TKA Was seen after that at ER for cellulitis of leg for a wound  Was tx with doxycycline  Much better now  Not very red   Injured her L shin today getting in car   Feeling pretty fair  Her surgery is planned for November / had to change due to her wound   Has a rash on her neck Happened after chiropractor and he used some cream on back o fher neck  Started Wednesday afternoon  Is itchy   Used sarna lotion otc - cools it  Few chronic itchy spots on back- dermatology watches    Patient Active Problem List   Diagnosis Date Noted   Wound of left leg 04/23/2022   Rash and nonspecific skin eruption 04/23/2022   Cellulitis of right leg 04/23/2022   Pre-op examination 03/30/2022   Toe pain 11/30/2021   Laryngopharyngeal reflux 11/20/2021   Tinnitus of both ears 08/18/2020   Itching of ear 08/18/2020   Multiple falls 03/30/2020   Lower extremity edema 02/21/2020   S/P TAVR (transcatheter aortic valve replacement)    Poor balance 11/25/2018   History of endometrial cancer 09/12/2017   Essential hypertension 08/29/2017   Severe calcific aortic stenosis 08/27/2017   Impacted cerumen of left ear 02/27/2017   Estrogen deficiency 11/05/2016   Routine general medical examination at a health care facility 10/15/2015   Hyperlipidemia with target LDL less than 100 09/02/2012   Degenerative disk disease 08/31/2011   Personal history of colonic polyps 11/03/2010   Gout 08/25/2010   DERMATITIS, SEBORRHEIC 08/25/2010   Osteopenia 09/05/2007   History of poliomyelitis 08/26/2007   Obesity 08/26/2007   Anxiety and depression 08/26/2007   ALLERGIC  RHINITIS 08/26/2007   GERD 08/26/2007   IBS 08/26/2007   OVERACTIVE BLADDER 08/26/2007   OSTEOARTHRITIS, HANDS, BILATERAL 08/26/2007   URINARY INCONTINENCE 08/26/2007   Past Medical History:  Diagnosis Date   Anxiety and depression 08/26/2007   Qualifier: Diagnosis of  By: Glori Bickers MD, Carmell Austria    Arthritis    knees   Constipation, slow transit 11/03/2010   Degenerative disk disease 08/31/2011   Depression with anxiety    DERMATITIS, SEBORRHEIC 08/25/2010   Qualifier: Diagnosis of  By: Glori Bickers MD, Carmell Austria    Endometrial cancer [facility])    02/ 2019  Diagnosed with D&C/hysteroscopy with polypectomy   Essential hypertension 08/29/2017   Fibromyalgia    tx mobic   Full dentures    GERD 08/26/2007   Qualifier: Diagnosis of  By: Glori Bickers MD, Carmell Austria    Gout    Hearing loss    wears bilateral hearing aids   History of colon polyps    History of nonmelanoma skin cancer    right lower leg   History of poliomyelitis 08/26/2007   Qualifier: History of  By: Glori Bickers MD, Carmell Austria    History of radiation therapy    Endometium- 11/20/17-12/11/17- Vag Cuff -Dr. Gery Pray   Hyperlipidemia, mild 09/02/2012   Hypertension    Hypokalemia 01/02/2011   IBS (irritable bowel syndrome)  with constipation   Obesity 08/26/2007   Qualifier: Diagnosis of  By: Glori Bickers MD, Carmell Austria    OSTEOARTHRITIS, HANDS, BILATERAL 08/26/2007   Qualifier: Diagnosis of  By: Glori Bickers MD, Carmell Austria    Osteopenia    OVERACTIVE BLADDER 08/26/2007   Qualifier: Diagnosis of  By: Glori Bickers MD, Carmell Austria    Personal history of colonic polyps 11/03/2010   05/2018 13 polyps mostly adenomas, ssp's - recall 1 year 2020 Gatha Mayer, MD, [facility]    Poor balance 11/25/2018   With falls  Has rise in R shoe/post polio  Weak legs as well     S/P TAVR (transcatheter aortic valve replacement)    s/p TAVR with a 90m Edwards S3U via the TF approach by Dr. BCyndia Bentand Dr. MAngelena Form  Severe calcific aortic stenosis 08/27/2017    -> Became  severe as of October 2020-referred for TAVR on 04/28/2019   URINARY INCONTINENCE 08/26/2007   Qualifier: Diagnosis of  By: TGlori BickersMD, MCarmell Austria   Wears hearing aid in both ears    Past Surgical History:  Procedure Laterality Date   [place]   COLONOSCOPY N/A 06/24/2015   Procedure: COLONOSCOPY;  Surgeon: CGatha Mayer MD;  Location: [facility];  Service: Endoscopy;  Laterality: N/A;   DILATATION & CURETTAGE/HYSTEROSCOPY WITH MYOSURE N/A 08/09/2017   Procedure: DILATATION & CURETTAGE/HYSTEROSCOPY WITH MYOSURE;  Surgeon: CChristophe Louis MD;  [place] [facility] [place];  Service: Gynecology;  Laterality: N/A;  Polypectomy   DILATION AND CURETTAGE OF UTERUS     PMB   HAND SURGERY Bilateral 1999   Thumbs    KNEE SURGERY Bilateral 1998   x2   LYMPH NODE BIOPSY N/A 10/08/2017   Procedure: LYMPH NODE BIOPSY;  Surgeon: REveritt Amber MD;  Location: [facility];  Service: Gynecology;  Laterality: N/A;   MULTIPLE TOOTH EXTRACTIONS     NASAL SINUS SURGERY  1976   polio surgery.     right foot - right leg 1.5" shorter than left leg - some wekness   RIGHT/LEFT HEART CATH AND CORONARY ANGIOGRAPHY N/A 04/09/2019   Procedure: RIGHT/LEFT HEART CATH AND CORONARY ANGIOGRAPHY;  Surgeon: HLeonie Man MD;  [place] [facility];  Service: Cardiovascular;; Severe AS, mean gradient 50 mmHg P-P 55 mmHg (estimated AVA 0.76 cm).  Mildly elevated pulmonary pressures with elevated LVEDP and PCWP.  Angiographically normal coronary arteries, but tortuous.   ROBOTIC ASSISTED TOTAL HYSTERECTOMY WITH BILATERAL SALPINGO OOPHERECTOMY Bilateral 10/08/2017   Procedure: XI ROBOTIC ASSISTED TOTAL HYSTERECTOMY WITH BILATERAL SALPINGO OOPHORECTOMY;  Surgeon: REveritt Amber MD;  Location: [facility];  Service: Gynecology;  Laterality: Bilateral;   TEE WITHOUT CARDIOVERSION N/A 04/28/2019   Procedure: TRANSESOPHAGEAL ECHOCARDIOGRAM (TEE);  Surgeon:  MBurnell Blanks MD;  [place] [facility];  Service: Open Heart Surgery;  Laterality: N/A;   THUMB ARTHROSCOPY  1999   TONSILLECTOMY  16128  76years old   TRANSCATHETER AORTIC VALVE REPLACEMENT, TRANSFEMORAL N/A 04/28/2019   Procedure: TRANSCATHETER AORTIC VALVE REPLACEMENT, TRANSFEMORAL;  Surgeon: MBurnell Blanks MD;  [facility]; ; 23 mm Sapien 3 Ultra THV via TF approach. (post-op mean AV gradient 13 mmHg)   TRANSTHORACIC ECHOCARDIOGRAM  05/04/2020   EF 60 to 65%.  Normal LV function and wall motion.  Indeterminate diastolic parameters.  Normal RV size and pressure.  Moderate LA dilation.  Moderate MAC but  no significant MR.  Post TAVR with 23 mm Sapien 3 valve: Mean gradient 14 mmHg.  No PVL.  Normal IVC   TRANSTHORACIC ECHOCARDIOGRAM  02/17/2019   Mod LVH. GR 2 DD. Severe AS (mean gradient = 44 mmHg with Mod AI. mild MS.   TRANSTHORACIC ECHOCARDIOGRAM  04/29/2019; 05/2020   a)  (post TAVR): EF 60-65%.  GRII DD.  Mod LA dilation & nl RA.  Mod MAC.  No  AI (no-PVL), mean AV gradient 13 mmHg.;; b) (2nd post TAVR) EF 60 to 65%.  Severely increased thickness/LVH.  GRII DD with only mildly elevated left atrial pressure.  Normal RA size.  Severe MAC with only trace MR.  No MS.  TAVR valve prosthesis functioning well.  Trivial AI.  Mean gradient 15 mmHg with a peak 29.8 mmHg   TUBAL LIGATION     UPPER GI ENDOSCOPY     normal   WISDOM TOOTH EXTRACTION     Social History   Tobacco Use   Smoking status: Never   Smokeless tobacco: Never  Vaping Use   Vaping Use: Never used  Substance Use Topics   Alcohol use: No    Alcohol/week: 0.0 standard drinks of alcohol   Drug use: No   Family History  Problem Relation Age of Onset   Kidney disease Mother    Alzheimer's disease Mother    Stroke Father 55   Colon cancer Father 6       possibly colon cancer, possibly just polyps   Kidney disease Sister    Heart attack Sister    Colon cancer Paternal Aunt  29   Cancer Paternal Uncle        unk type   Esophageal cancer Neg Hx    Stomach cancer Neg Hx    Rectal cancer Neg Hx    Breast cancer Neg Hx    Ovarian cancer Neg Hx    Endometrial cancer Neg Hx    Pancreatic cancer Neg Hx    Prostate cancer Neg Hx    Allergies  Allergen Reactions   Clarithromycin Swelling and Other (See Comments)    Throat Swells   Penicillins Hives and Other (See Comments)    WELTS Has patient had a PCN reaction causing immediate rash, facial/tongue/throat swelling, SOB or lightheadedness with hypotension: No Has patient had a PCN reaction causing severe rash involving mucus membranes or skin necrosis: Unknown Has patient had a PCN reaction that required hospitalization:No Has patient had a PCN reaction occurring within the last 10 years: No If all of the above answers are "NO", then may proceed with Cephalosporin use.    Codeine Nausea And Vomiting   Current Outpatient Medications on File Prior to Visit  Medication Sig Dispense Refill   acetaminophen (TYLENOL) 325 MG tablet Take 650 mg by mouth every 6 (six) hours as needed (for pain.).     acetic acid-hydrocortisone (VOSOL-HC) OTIC solution Place 2 drops into both ears 2 (two) times daily as needed (itching).     Ascorbic Acid (VITAMIN C) 1000 MG tablet Take 1,000 mg by mouth daily with breakfast.     Black Pepper-Turmeric (TURMERIC COMPLEX/BLACK PEPPER PO) Take 15 mLs by mouth daily with breakfast.      Calcium-Phosphorus-Vitamin D (CALCIUM/VITAMIN D3/ADULT GUMMY PO) Take 1 tablet by mouth 2 (two) times daily.      cetirizine (ZYRTEC) 10 MG tablet Take 5 mg by mouth as needed for allergies.     cholecalciferol (VITAMIN D) 1000 UNITS tablet Take 1,000  Units by mouth daily with breakfast.      fluticasone (FLONASE) 50 MCG/ACT nasal spray Place 2 sprays into both nostrils at bedtime.      furosemide (LASIX) 20 MG tablet MAY TAKE 1 TABLET (20 MG TOTAL) UP TO THREE TIMES A WEEK. DO NOT TAKE HCTZ THAT DAY. 30  tablet 11   hydrochlorothiazide (HYDRODIURIL) 25 MG tablet Take 1 tablet (25 mg total) by mouth daily with lunch. 90 tablet 3   lubiprostone (AMITIZA) 24 MCG capsule Take 1 capsule (24 mcg total) by mouth 2 (two) times daily. LUNCH & SUPPER 180 capsule 3   meclizine (ANTIVERT) 25 MG tablet Take 0.5-1 tablets (12.5-25 mg total) by mouth 3 (three) times daily as needed for dizziness. Caution of sedation 20 tablet 0   meloxicam (MOBIC) 7.5 MG tablet TAKE 1 TABLET BY MOUTH AT BEDTIME. 90 tablet 2   metoprolol succinate (TOPROL-XL) 25 MG 24 hr tablet Take 1 tablet (25 mg total) by mouth daily. 90 tablet 3   mupirocin ointment (BACTROBAN) 2 % Apply 1 Application topically 2 (two) times daily. To wound 22 g 0   NEXIUM 40 MG capsule 1 Add'l Sig oral Select Frequency     Polyethyl Glycol-Propyl Glycol 0.4-0.3 % SOLN Place 1-2 drops into both eyes 2 (two) times daily.      potassium chloride (KLOR-CON) 10 MEQ tablet Take 2 tablets (20 mEq total) by mouth 2 (two) times daily. 360 tablet 3   Simethicone (PHAZYME PO) Take 1 tablet by mouth at bedtime as needed (for gas).     sodium chloride (OCEAN) 0.65 % nasal spray Place 1 spray into the nose daily.     venlafaxine XR (EFFEXOR XR) 75 MG 24 hr capsule Take 1 capsule (75 mg total) by mouth daily after breakfast. 90 capsule 3   amLODipine (NORVASC) 10 MG tablet Take 1 tablet (10 mg total) by mouth daily. 180 tablet 3   No current facility-administered medications on file prior to visit.     Review of Systems  Constitutional:  Negative for activity change, appetite change, fatigue, fever and unexpected weight change.  HENT:  Negative for congestion, ear pain, rhinorrhea, sinus pressure and sore throat.   Eyes:  Negative for pain, redness and visual disturbance.  Respiratory:  Negative for cough, shortness of breath and wheezing.   Cardiovascular:  Negative for chest pain and palpitations.  Gastrointestinal:  Negative for abdominal pain, blood in stool,  constipation and diarrhea.  Endocrine: Negative for polydipsia and polyuria.  Genitourinary:  Negative for dysuria, frequency and urgency.  Musculoskeletal:  Positive for arthralgias. Negative for back pain and myalgias.  Skin:  Positive for rash and wound. Negative for pallor.  Allergic/Immunologic: Negative for environmental allergies.  Neurological:  Negative for dizziness, syncope and headaches.  Hematological:  Negative for adenopathy. Does not bruise/bleed easily.  Psychiatric/Behavioral:  Negative for decreased concentration and dysphoric mood. The patient is not nervous/anxious.        Objective:   Physical Exam Constitutional:      General: She is not in acute distress.    Appearance: Normal appearance. She is obese. She is not ill-appearing or diaphoretic.  Eyes:     General: No scleral icterus.    Conjunctiva/sclera: Conjunctivae normal.     Pupils: Pupils are equal, round, and reactive to light.  Cardiovascular:     Rate and Rhythm: Normal rate and regular rhythm.     Heart sounds: Murmur heard.  Pulmonary:     Effort:  Pulmonary effort is normal. No respiratory distress.     Breath sounds: Normal breath sounds. No wheezing.  Musculoskeletal:     Cervical back: Neck supple.  Lymphadenopathy:     Cervical: No cervical adenopathy.  Skin:    Findings: Rash present.     Comments: Site of prev wound on R shin is healed  Scant erythema blow it, improved   Small (less than 1 cm) wound/superficial skin tear L shin  Bleeding controlled with compression  No erythema or swelling  Mild papular rash/pink in color on back of neck bilaterally  No excoriations No open areas   Neurological:     Mental Status: She is alert.     Cranial Nerves: No cranial nerve deficit.     Motor: No weakness.  Psychiatric:        Mood and Affect: Mood normal.           Assessment & Plan:   Problem List Items Addressed This Visit       Musculoskeletal and Integument   Rash and  nonspecific skin eruption - Primary    Mild rash on back of neck May have reacted to product at the chiropractor   Refilled triamcinolone cream 0.1 % to use bid prn  Update if not starting to improve in a week or if worsening           Other   Cellulitis of right leg    From wound last month (had to re schedule her ortho surg) Was tx in [facility] with doxycycline Now much improved  Will continue to watch  No restrictions for upcoming surg      Wound of left leg    Pt hit shin on car getting out  inst to keep clean with soap and water, compression if needed  Tetanus shot is utd  Will watch for s/s infection and update

## 2022-04-23 NOTE — Assessment & Plan Note (Signed)
From wound last month (had to re schedule her ortho surg) Was tx in [facility] with doxycycline Now much improved  Will continue to watch  No restrictions for upcoming surg

## 2022-04-23 NOTE — Assessment & Plan Note (Signed)
Pt hit shin on car getting out  inst to keep clean with soap and water, compression if needed  Tetanus shot is utd  Will watch for s/s infection and update

## 2022-04-30 DIAGNOSIS — M1711 Unilateral primary osteoarthritis, right knee: Secondary | ICD-10-CM | POA: Diagnosis not present

## 2022-05-04 NOTE — H&P (Signed)
KNEE ARTHROPLASTY ADMISSION H&P  Patient ID: Veronica Greene MRN: [id number] DOB/AGE: 1946-01-15 76 y.o.  Chief Complaint: right knee pain.  Planned Procedure Date: 05/22/22 Medical Clearance by Dr. Loura Pardon   Cardiac Clearance by Dr. Glenetta Hew   HPI: Veronica Greene is a 76 y.o. female who presents for evaluation of OA RIGHT KNEE. The patient has a history of pain and functional disability in the right knee due to arthritis and has failed non-surgical conservative treatments for greater than 12 weeks to include NSAID's and/or analgesics, corticosteriod injections, viscosupplementation injections, use of assistive devices, and activity modification.  Onset of symptoms was gradual, starting 3 years ago with gradually worsening course since that time. The patient noted prior procedures on the knee to include  arthroscopy and menisectomy on the right knee.  Patient currently rates pain at 8 out of 10 with activity. Patient has night pain, worsening of pain with activity and weight bearing, and pain that interferes with activities of daily living.  Patient has evidence of subchondral sclerosis, periarticular osteophytes, joint subluxation, and joint space narrowing by imaging studies. The right leg is shorter than the left leg due to Polio as a child which stunted bone growth. There is no active infection.  Past Medical History:  Diagnosis Date   Anxiety and depression 08/26/2007   Qualifier: Diagnosis of  By: Glori Bickers MD, Carmell Austria    Arthritis    knees   Constipation, slow transit 11/03/2010   Degenerative disk disease 08/31/2011   Depression with anxiety    DERMATITIS, SEBORRHEIC 08/25/2010   Qualifier: Diagnosis of  By: Glori Bickers MD, Carmell Austria    Endometrial cancer [facility])    02/ 2019  Diagnosed with D&C/hysteroscopy with polypectomy   Essential hypertension 08/29/2017   Fibromyalgia    tx mobic   Full dentures    GERD 08/26/2007   Qualifier: Diagnosis of  By: Glori Bickers MD, Carmell Austria    Gout    Hearing loss    wears bilateral hearing aids   History of colon polyps    History of nonmelanoma skin cancer    right lower leg   History of poliomyelitis 08/26/2007   Qualifier: History of  By: Glori Bickers MD, Carmell Austria    History of radiation therapy    Endometium- 11/20/17-12/11/17- Vag Cuff -Dr. Gery Pray   Hyperlipidemia, mild 09/02/2012   Hypertension    Hypokalemia 01/02/2011   IBS (irritable bowel syndrome)    with constipation   Obesity 08/26/2007   Qualifier: Diagnosis of  By: Glori Bickers MD, Carmell Austria    OSTEOARTHRITIS, HANDS, BILATERAL 08/26/2007   Qualifier: Diagnosis of  By: Glori Bickers MD, Carmell Austria    Osteopenia    OVERACTIVE BLADDER 08/26/2007   Qualifier: Diagnosis of  By: Glori Bickers MD, Carmell Austria    Personal history of colonic polyps 11/03/2010   05/2018 13 polyps mostly adenomas, ssp's - recall 1 year 2020 Gatha Mayer, MD, [facility]    Poor balance 11/25/2018   With falls  Has rise in R shoe/post polio  Weak legs as well     S/P TAVR (transcatheter aortic valve replacement)    s/p TAVR with a 55m Edwards S3U via the TF approach by Dr. BCyndia Bentand Dr. MAngelena Form  Severe calcific aortic stenosis 08/27/2017    -> Became severe as of October 2020-referred for TAVR on 04/28/2019   URINARY INCONTINENCE 08/26/2007   Qualifier: Diagnosis of  By: TGlori BickersMD, MCarmell Austria   Wears hearing  aid in both ears    Past Surgical History:  Procedure Laterality Date   ANKLE SURGERY Right [place] Bilateral 1986, 1993   CATARACT EXTRACTION Right 11/16/2019   COLONOSCOPY N/A 06/24/2015   Procedure: COLONOSCOPY;  Surgeon: Gatha Mayer, MD;  Location: [facility];  Service: Endoscopy;  Laterality: N/A;   DILATATION & CURETTAGE/HYSTEROSCOPY WITH MYOSURE N/A 08/09/2017   Procedure: DILATATION & CURETTAGE/HYSTEROSCOPY WITH MYOSURE;  Surgeon: Christophe Louis, MD;  Location: [facility];  Service: Gynecology;  Laterality: N/A;  Polypectomy   DILATION AND CURETTAGE OF UTERUS      PMB   HAND SURGERY Bilateral 1999   Thumbs    KNEE SURGERY Bilateral 1998   x2   LYMPH NODE BIOPSY N/A 10/08/2017   Procedure: LYMPH NODE BIOPSY;  Surgeon: [name], MD;  Location: [facility];  Service: Gynecology;  Laterality: N/A;   MULTIPLE TOOTH EXTRACTIONS     NASAL SINUS SURGERY  1976   polio surgery.     right foot - right leg 1.5" shorter than left leg - some wekness   RIGHT/LEFT HEART CATH AND CORONARY ANGIOGRAPHY N/A 04/09/2019   Procedure: RIGHT/LEFT HEART CATH AND CORONARY ANGIOGRAPHY;  Surgeon: Leonie Man, MD;  Location: [facility];  Service: Cardiovascular;; Severe AS, mean gradient 50 mmHg P-P 55 mmHg (estimated AVA 0.76 cm).  Mildly elevated pulmonary pressures with elevated LVEDP and PCWP.  Angiographically normal coronary arteries, but tortuous.   ROBOTIC ASSISTED TOTAL HYSTERECTOMY WITH BILATERAL SALPINGO OOPHERECTOMY Bilateral 10/08/2017   Procedure: XI ROBOTIC ASSISTED TOTAL HYSTERECTOMY WITH BILATERAL SALPINGO OOPHORECTOMY;  Surgeon: [name], MD;  Location: [facility];  Service: Gynecology;  Laterality: Bilateral;   TEE WITHOUT CARDIOVERSION N/A 04/28/2019   Procedure: TRANSESOPHAGEAL ECHOCARDIOGRAM (TEE);  Surgeon: Burnell Blanks, MD;  Location: [facility];  Service: Open Heart Surgery;  Laterality: N/A;   THUMB ARTHROSCOPY  1999   TONSILLECTOMY  9436   76 years old   TRANSCATHETER AORTIC VALVE REPLACEMENT, TRANSFEMORAL N/A 04/28/2019   Procedure: TRANSCATHETER AORTIC VALVE REPLACEMENT, TRANSFEMORAL;  Surgeon: Burnell Blanks, MD;  Location: [place] [facility]; ; 23 mm Sapien 3 Ultra THV via TF approach. (post-op mean AV gradient 13 mmHg)   TRANSTHORACIC ECHOCARDIOGRAM  05/04/2020   EF 60 to 65%.  Normal LV function and wall motion.  Indeterminate diastolic parameters.  Normal RV size and pressure.  Moderate LA dilation.  Moderate MAC but no significant MR.  Post TAVR with 23 mm Sapien 3 valve: Mean gradient 14 mmHg.  No PVL.  Normal IVC    TRANSTHORACIC ECHOCARDIOGRAM  02/17/2019   Mod LVH. GR 2 DD. Severe AS (mean gradient = 44 mmHg with Mod AI. mild MS.   TRANSTHORACIC ECHOCARDIOGRAM  04/29/2019; 05/2020   a)  (post TAVR): EF 60-65%.  GRII DD.  Mod LA dilation & nl RA.  Mod MAC.  No  AI (no-PVL), mean AV gradient 13 mmHg.;; b) (2nd post TAVR) EF 60 to 65%.  Severely increased thickness/LVH.  GRII DD with only mildly elevated left atrial pressure.  Normal RA size.  Severe MAC with only trace MR.  No MS.  TAVR valve prosthesis functioning well.  Trivial AI.  Mean gradient 15 mmHg with a peak 29.8 mmHg   TUBAL LIGATION     UPPER GI ENDOSCOPY     normal   WISDOM TOOTH EXTRACTION     Allergies  Allergen Reactions   Clarithromycin Swelling and Other (See Comments)  Throat Swells   Penicillins Hives and Other (See Comments)    WELTS Has patient had a PCN reaction causing immediate rash, facial/tongue/throat swelling, SOB or lightheadedness with hypotension: No Has patient had a PCN reaction causing severe rash involving mucus membranes or skin necrosis: Unknown Has patient had a PCN reaction that required hospitalization:No Has patient had a PCN reaction occurring within the last 10 years: No If all of the above answers are "NO", then may proceed with Cephalosporin use.    Codeine Nausea And Vomiting   Prior to Admission medications   Medication Sig Start Date End Date Taking? Authorizing Provider  acetaminophen (TYLENOL) 325 MG tablet Take 650 mg by mouth every 6 (six) hours as needed (for pain.).    [provider]  acetic acid-hydrocortisone (VOSOL-HC) OTIC solution Place 2 drops into both ears 2 (two) times daily as needed (itching).    [provider]  amLODipine (NORVASC) 10 MG tablet Take 1 tablet (10 mg total) by mouth daily. 05/01/21 03/29/22  Leonie Man, MD  Ascorbic Acid (VITAMIN C) 1000 MG tablet Take 1,000 mg by mouth daily with breakfast.    [provider]  Black  Pepper-Turmeric (TURMERIC COMPLEX/BLACK PEPPER PO) Take 15 mLs by mouth daily with breakfast.     [provider]  Calcium-Phosphorus-Vitamin D (CALCIUM/VITAMIN D3/ADULT GUMMY PO) Take 1 tablet by mouth 2 (two) times daily.     [provider]  cetirizine (ZYRTEC) 10 MG tablet Take 5 mg by mouth as needed for allergies.    [provider]  cholecalciferol (VITAMIN D) 1000 UNITS tablet Take 1,000 Units by mouth daily with breakfast.     [provider]  fluticasone (FLONASE) 50 MCG/ACT nasal spray Place 2 sprays into both nostrils at bedtime.     [provider]  furosemide (LASIX) 20 MG tablet MAY TAKE 1 TABLET (20 MG TOTAL) UP TO THREE TIMES A WEEK. DO NOT TAKE HCTZ THAT DAY. 06/20/21   Leonie Man, MD  hydrochlorothiazide (HYDRODIURIL) 25 MG tablet Take 1 tablet (25 mg total) by mouth daily with lunch. 11/30/21   Tower, [name], MD  lubiprostone (AMITIZA) 24 MCG capsule Take 1 capsule (24 mcg total) by mouth 2 (two) times daily. LUNCH & SUPPER 11/30/21   Tower, [name], MD  meclizine (ANTIVERT) 25 MG tablet Take 0.5-1 tablets (12.5-25 mg total) by mouth 3 (three) times daily as needed for dizziness. Caution of sedation 06/15/21   Tower, Roque Lias A, MD  meloxicam (MOBIC) 7.5 MG tablet TAKE 1 TABLET BY MOUTH AT BEDTIME. 02/28/22   Tower, [name], MD  metoprolol succinate (TOPROL-XL) 25 MG 24 hr tablet Take 1 tablet (25 mg total) by mouth daily. 03/02/22   Leonie Man, MD  mupirocin ointment (BACTROBAN) 2 % Apply 1 Application topically 2 (two) times daily. To wound 04/03/22   Tower, [name], MD  NEXIUM 40 MG capsule 1 Add'l Sig oral Select Frequency 10/26/19   [provider]  Polyethyl Glycol-Propyl Glycol 0.4-0.3 % SOLN Place 1-2 drops into both eyes 2 (two) times daily.     [provider]  potassium chloride (KLOR-CON) 10 MEQ tablet Take 2 tablets (20 mEq total) by mouth 2 (two) times daily. 11/30/21   Tower, [name], MD  Simethicone  (PHAZYME PO) Take 1 tablet by mouth at bedtime as needed (for gas).    [provider]  sodium chloride (OCEAN) 0.65 % nasal spray Place 1 spray into the nose daily.  [provider]  triamcinolone cream (KENALOG) 0.1 % Apply topically 2 (two) times daily. To affected area/ rash 04/23/22   Tower, [name], MD  venlafaxine XR (EFFEXOR XR) 75 MG 24 hr capsule Take 1 capsule (75 mg total) by mouth daily after breakfast. 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  She has 2  children and 3 grandchildren.     She is currently retired.  But enjoys cleaning house and doing chores.  She likes to do yard work.  Does not routinely exercise.   Social Determinants of Health   Financial Resource Strain: Low Risk  (12/15/2021)   Overall Financial Resource Strain (CARDIA)    Difficulty of Paying Living Expenses: Not hard at all  Food Insecurity: No Food Insecurity (12/15/2021)   Hunger Vital Sign    Worried About Running Out of Food in the Last Year: Never true    Ran Out of Food in the Last Year: Never true  Transportation Needs: No Transportation Needs (12/15/2021)   PRAPARE - Hydrologist (Medical): No    Lack of Transportation (Non-Medical): No  Physical Activity:  Insufficiently Active (12/15/2021)   Exercise Vital Sign    Days of Exercise per Week: 3 days    Minutes of Exercise per Session: 30 min  Stress: No Stress Concern Present (12/15/2021)   [place]    Feeling of Stress : Only a little  Social Connections: Moderately Integrated (12/15/2021)   Social Connection and Isolation Panel [NHANES]    Frequency of Communication with Friends and Family: More than three times a week    Frequency of Social Gatherings with Friends and Family: Once a week    Attends Religious Services: More than 4 times per year    Active Member of [employer] or Organizations: No    Attends Music therapist: Never    Marital Status: Married   Family History  Problem Relation Age of Onset   Kidney disease Mother    Alzheimer's disease Mother    Stroke Father 49   Colon cancer Father 69       possibly colon cancer, possibly just polyps   Kidney disease Sister    Heart attack Sister    Colon cancer Paternal Aunt 58   Cancer Paternal Uncle        unk type   Esophageal cancer Neg Hx    Stomach cancer Neg Hx    Rectal cancer Neg Hx    Breast cancer Neg Hx    Ovarian cancer Neg Hx    Endometrial cancer Neg Hx    Pancreatic cancer Neg  Hx    Prostate cancer Neg Hx     ROS: Currently denies lightheadedness, dizziness, Fever, chills, CP, SOB.   No personal history of DVT, PE, MI, or CVA. No loose teeth. + full dentures present All other systems have been reviewed and were otherwise currently negative with the exception of those mentioned in the HPI and as above.  Objective: Vitals: Ht: 5'4" Wt: 177.6 lbs Temp: 98.1 BP: 151/74 Pulse: 67 O2 95% on room air.   Physical Exam: General: Alert, NAD.  Antalgic Gait  HEENT: EOMI, Good Neck Extension  Pulm: No increased work of breathing.  Clear B/L A/P w/o crackle or wheeze.  CV: RRR, No g/r appreciated. Blowing murmur best heard over the aortic valve  and radiating to the carotids present GI: soft, NT, ND. BS x 4 quadrants Neuro: CN II-XII grossly intact without focal deficit.  Sensation intact distally Skin: No lesions in the area of chief complaint MSK/Surgical Site:   + JLT. ROM 0-110.  Decreased strength in extension and flexion.  +EHL/FHL.  NVI.  Pain and instability with varus and valgus stress. The right lower leg from the knee to the foot is visibly shorter than the left.    Imaging Review Plain radiographs demonstrate severe degenerative joint disease of the right knee.   The overall alignment issignificant valgus. The bone quality appears to be fair for age and reported activity level.  Preoperative templating of the joint replacement has been completed, documented, and submitted to the Operating Room personnel in order to optimize intra-operative equipment management.  Assessment: OA RIGHT KNEE Active Problems:   * No active hospital problems. *   Plan: Plan for Procedure(s): TOTAL KNEE ARTHROPLASTY  The patient history, physical exam, clinical judgement of the provider and imaging are consistent with end stage degenerative joint disease and total joint arthroplasty is deemed medically necessary. The treatment options including medical management, injection therapy, and arthroplasty were discussed at length. The risks and benefits of Procedure(s): TOTAL KNEE ARTHROPLASTY were presented and reviewed.  The risks of nonoperative treatment, versus surgical intervention including but not limited to continued pain, aseptic loosening, stiffness, dislocation/subluxation, infection, bleeding, nerve injury, blood clots, cardiopulmonary complications, morbidity, mortality, among others were discussed. The patient verbalizes understanding and wishes to proceed with the plan.  Patient is being admitted for inpatient treatment for surgery, pain control, PT, prophylactic antibiotics, VTE prophylaxis, progressive ambulation, ADL's and  discharge planning. She will spend the night in observation.   The patient does meet the criteria for TXA which will be used perioperatively.   ASA 81 mg BID will be used postoperatively for DVT prophylaxis in addition to SCDs, and early ambulation. Plan for Tylenol, Mobic, oxycodone for pain.   Robaxin for muscle spasms.   Zofran for nausea and vomiting. Pharmacy- [facility] The patient is planning to be discharged home with OPPT and into the care of her husband Juanda Crumble who can be reached at [phone] Follow up appt 06/06/22 at 4:00pm     Alisa Graff Office [phone] 05/04/2022 3:17 PM

## 2022-05-07 ENCOUNTER — Encounter: Payer: Self-pay | Admitting: Cardiology

## 2022-05-07 ENCOUNTER — Ambulatory Visit: Payer: PPO | Attending: Cardiology | Admitting: Cardiology

## 2022-05-07 VITALS — BP 126/68 | HR 72 | Ht 64.0 in | Wt 179.2 lb

## 2022-05-07 DIAGNOSIS — Z01818 Encounter for other preprocedural examination: Secondary | ICD-10-CM

## 2022-05-07 DIAGNOSIS — I10 Essential (primary) hypertension: Secondary | ICD-10-CM | POA: Diagnosis not present

## 2022-05-07 DIAGNOSIS — E785 Hyperlipidemia, unspecified: Secondary | ICD-10-CM | POA: Diagnosis not present

## 2022-05-07 DIAGNOSIS — Z952 Presence of prosthetic heart valve: Secondary | ICD-10-CM | POA: Diagnosis not present

## 2022-05-07 DIAGNOSIS — I350 Nonrheumatic aortic (valve) stenosis: Secondary | ICD-10-CM | POA: Diagnosis not present

## 2022-05-07 NOTE — Progress Notes (Unsigned)
Primary Care Provider: Abner Greenspan, MD [place] Cardiologist: Glenetta Hew, MD Electrophysiologist: None  Clinic Note: No chief complaint on file.   ===================================  ASSESSMENT/PLAN   Problem List Items Addressed This Visit       Cardiology Problems   Severe calcific aortic stenosis (Chronic)   Essential hypertension - Primary (Chronic)     Other   S/P TAVR (transcatheter aortic valve replacement) (Chronic)    ===================================  HPI:    Veronica Greene is a 76 y.o. female with a PMH below who presents today for ***. Veronica Greene is a 76 y.o. female who is being seen today for the evaluation of *** at the request of Tower, [name], MD.  Veronica Greene was last seen on 03/29/2022 by Odie Sera, NP  Recent Hospitalizations: ***  Reviewed  CV studies:    The following studies were reviewed today: (if available, images/films reviewed: From Epic Chart or Care Everywhere) ***:  Interval History:   Veronica Greene   CV Review of Symptoms (Summary): Cardiovascular ROS: {roscv:[id number]}  REVIEWED OF SYSTEMS   ROS  I have reviewed and (if needed) personally updated the patient's problem list, medications, allergies, past medical and surgical history, social and family history.   PAST MEDICAL HISTORY   Past Medical History:  Diagnosis Date   Anxiety and depression 08/26/2007   Qualifier: Diagnosis of  By: Glori Bickers MD, Carmell Austria    Arthritis    knees   Constipation, slow transit 11/03/2010   Degenerative disk disease 08/31/2011   Depression with anxiety    DERMATITIS, SEBORRHEIC 08/25/2010   Qualifier: Diagnosis of  By: Glori Bickers MD, Carmell Austria    Endometrial cancer [facility])    02/ 2019  Diagnosed with D&C/hysteroscopy with polypectomy   Essential hypertension 08/29/2017   Fibromyalgia    tx mobic   Full dentures    GERD 08/26/2007   Qualifier: Diagnosis of  By: Glori Bickers MD, Carmell Austria     Gout    Hearing loss    wears bilateral hearing aids   History of colon polyps    History of nonmelanoma skin cancer    right lower leg   History of poliomyelitis 08/26/2007   Qualifier: History of  By: Glori Bickers MD, Carmell Austria    History of radiation therapy    Endometium- 11/20/17-12/11/17- Vag Cuff -Dr. Gery Pray   Hyperlipidemia, mild 09/02/2012   Hypertension    Hypokalemia 01/02/2011   IBS (irritable bowel syndrome)    with constipation   Obesity 08/26/2007   Qualifier: Diagnosis of  By: Glori Bickers MD, Carmell Austria    OSTEOARTHRITIS, HANDS, BILATERAL 08/26/2007   Qualifier: Diagnosis of  By: Glori Bickers MD, Carmell Austria    Osteopenia    OVERACTIVE BLADDER 08/26/2007   Qualifier: Diagnosis of  By: Glori Bickers MD, Carmell Austria    Personal history of colonic polyps 11/03/2010   05/2018 13 polyps mostly adenomas, ssp's - recall 1 year 2020 Gatha Mayer, MD, [facility]    Poor balance 11/25/2018   With falls  Has rise in R shoe/post polio  Weak legs as well     S/P TAVR (transcatheter aortic valve replacement)    s/p TAVR with a 13m Edwards S3U via the TF approach by Dr. BCyndia Bentand Dr. MAngelena Form  Severe calcific aortic stenosis 08/27/2017    -> Became severe as of October 2020-referred for TAVR on 04/28/2019   URINARY INCONTINENCE 08/26/2007   Qualifier: Diagnosis of  By: Glori Bickers MD, Carmell Austria    Wears hearing aid in both ears     PAST SURGICAL HISTORY   Past Surgical History:  Procedure Laterality Date   ANKLE SURGERY Right 1956   CARPAL TUNNEL RELEASE Bilateral 1986, 1993   CATARACT EXTRACTION Right 11/16/2019   COLONOSCOPY N/A 06/24/2015   Procedure: COLONOSCOPY;  Surgeon: Gatha Mayer, MD;  Location: [facility];  Service: Endoscopy;  Laterality: N/A;   DILATATION & CURETTAGE/HYSTEROSCOPY WITH MYOSURE N/A 08/09/2017   Procedure: DILATATION & CURETTAGE/HYSTEROSCOPY WITH MYOSURE;  Surgeon: Christophe Louis, MD;  Location: [facility];  Service: Gynecology;  Laterality: N/A;  Polypectomy   DILATION AND  CURETTAGE OF UTERUS     PMB   HAND SURGERY Bilateral 1999   Thumbs    KNEE SURGERY Bilateral 1998   x2   LYMPH NODE BIOPSY N/A 10/08/2017   Procedure: LYMPH NODE BIOPSY;  Surgeon: [name], MD;  Location: [facility];  Service: Gynecology;  Laterality: N/A;   MULTIPLE TOOTH EXTRACTIONS     NASAL SINUS SURGERY  1976   polio surgery.     right foot - right leg 1.5" shorter than left leg - some wekness   RIGHT/LEFT HEART CATH AND CORONARY ANGIOGRAPHY N/A 04/09/2019   Procedure: RIGHT/LEFT HEART CATH AND CORONARY ANGIOGRAPHY;  Surgeon: Leonie Man, MD;  Location: [facility];  Service: Cardiovascular;; Severe AS, mean gradient 50 mmHg P-P 55 mmHg (estimated AVA 0.76 cm).  Mildly elevated pulmonary pressures with elevated LVEDP and PCWP.  Angiographically normal coronary arteries, but tortuous.   ROBOTIC ASSISTED TOTAL HYSTERECTOMY WITH BILATERAL SALPINGO OOPHERECTOMY Bilateral 10/08/2017   Procedure: XI ROBOTIC ASSISTED TOTAL HYSTERECTOMY WITH BILATERAL SALPINGO OOPHORECTOMY;  Surgeon: [name], MD;  Location: [facility];  Service: Gynecology;  Laterality: Bilateral;   TEE WITHOUT CARDIOVERSION N/A 04/28/2019   Procedure: TRANSESOPHAGEAL ECHOCARDIOGRAM (TEE);  Surgeon: Burnell Blanks, MD;  Location: [place] [facility];  Service: Open Heart Surgery;  Laterality: N/A;   THUMB ARTHROSCOPY  1999   TONSILLECTOMY  5560   76 years old   TRANSCATHETER AORTIC VALVE REPLACEMENT, TRANSFEMORAL N/A 04/28/2019   Procedure: TRANSCATHETER AORTIC VALVE REPLACEMENT, TRANSFEMORAL;  Surgeon: Burnell Blanks, MD;  Location: [facility]; ; 23 mm Sapien 3 Ultra THV via TF approach. (post-op mean AV gradient 13 mmHg)   TRANSTHORACIC ECHOCARDIOGRAM  05/04/2020   EF 60 to 65%.  Normal LV function and wall motion.  Indeterminate diastolic parameters.  Normal RV size and pressure.  Moderate LA dilation.  Moderate MAC but no significant MR.  Post TAVR with 23 mm Sapien 3 valve: Mean gradient 14  mmHg.  No PVL.  Normal IVC   TRANSTHORACIC ECHOCARDIOGRAM  02/17/2019   Mod LVH. GR 2 DD. Severe AS (mean gradient = 44 mmHg with Mod AI. mild MS.   TRANSTHORACIC ECHOCARDIOGRAM  04/29/2019; 05/2020   a)  (post TAVR): EF 60-65%.  GRII DD.  Mod LA dilation & nl RA.  Mod MAC.  No  AI (no-PVL), mean AV gradient 13 mmHg.;; b) (2nd post TAVR) EF 60 to 65%.  Severely increased thickness/LVH.  GRII DD with only mildly elevated left atrial pressure.  Normal RA size.  Severe MAC with only trace MR.  No MS.  TAVR valve prosthesis functioning well.  Trivial AI.  Mean gradient 15 mmHg with a peak 29.8 mmHg   TUBAL LIGATION     UPPER GI ENDOSCOPY     normal   WISDOM TOOTH EXTRACTION  Immunization History  Administered Date(s) Administered   Fluad Quad(high Dose 65+) 05/06/2019, 05/04/2020, 03/30/2022   Influenza Split 04/08/2008, 06/30/2009   Influenza Whole 04/08/2008, 06/30/2009   Influenza, High Dose Seasonal PF 05/17/2015, 05/07/2016, 05/07/2017, 04/22/2018, 05/06/2019, 05/04/2020, 05/04/2021   Influenza-Unspecified 05/09/2014, 05/17/2015, 05/07/2016, 05/07/2017, 04/22/2018, 05/04/2021   PFIZER Comirnaty(Gray Top)Covid-19 Tri-Sucrose Vaccine 07/30/2019, 08/20/2019, 04/23/2020   PFIZER(Purple Top)SARS-COV-2 Vaccination 07/30/2019, 08/20/2019, 04/23/2020   Pneumococcal Conjugate-13 10/12/2014   Pneumococcal Polysaccharide-23 08/25/2010   Pneumococcal-Unspecified 08/25/2010   Td 10/15/2003   Td,absorbed, Preservative Free, Adult Use, Lf Unspecified 10/15/2003   Tdap 06/18/2018   Zoster, Live 08/12/2008    MEDICATIONS/ALLERGIES   Current Meds  Medication Sig   acetaminophen (TYLENOL) 325 MG tablet Take 650 mg by mouth every 6 (six) hours as needed (for pain.).   acetic acid-hydrocortisone (VOSOL-HC) OTIC solution Place 2 drops into both ears 2 (two) times daily as needed (itching).   amLODipine (NORVASC) 10 MG tablet Take 1 tablet (10 mg total) by mouth daily.   Ascorbic Acid (VITAMIN C)  1000 MG tablet Take 1,000 mg by mouth daily with breakfast.   Black Pepper-Turmeric (TURMERIC COMPLEX/BLACK PEPPER PO) Take 15 mLs by mouth daily with breakfast.    Calcium-Phosphorus-Vitamin D (CALCIUM/VITAMIN D3/ADULT GUMMY PO) Take 1 tablet by mouth 2 (two) times daily.    cetirizine (ZYRTEC) 10 MG tablet Take 5 mg by mouth as needed for allergies.   cholecalciferol (VITAMIN D) 1000 UNITS tablet Take 1,000 Units by mouth daily with breakfast.    fluticasone (FLONASE) 50 MCG/ACT nasal spray Place 2 sprays into both nostrils at bedtime.    furosemide (LASIX) 20 MG tablet MAY TAKE 1 TABLET (20 MG TOTAL) UP TO THREE TIMES A WEEK. DO NOT TAKE HCTZ THAT DAY.   hydrochlorothiazide (HYDRODIURIL) 25 MG tablet Take 1 tablet (25 mg total) by mouth daily with lunch.   lubiprostone (AMITIZA) 24 MCG capsule Take 1 capsule (24 mcg total) by mouth 2 (two) times daily. LUNCH & SUPPER   meclizine (ANTIVERT) 25 MG tablet Take 0.5-1 tablets (12.5-25 mg total) by mouth 3 (three) times daily as needed for dizziness. Caution of sedation   meloxicam (MOBIC) 7.5 MG tablet TAKE 1 TABLET BY MOUTH AT BEDTIME.   metoprolol succinate (TOPROL-XL) 25 MG 24 hr tablet Take 1 tablet (25 mg total) by mouth daily.   mupirocin ointment (BACTROBAN) 2 % Apply 1 Application topically 2 (two) times daily. To wound   NEXIUM 40 MG capsule 1 Add'l Sig oral Select Frequency   Polyethyl Glycol-Propyl Glycol 0.4-0.3 % SOLN Place 1-2 drops into both eyes 2 (two) times daily.    potassium chloride (KLOR-CON) 10 MEQ tablet Take 2 tablets (20 mEq total) by mouth 2 (two) times daily.   Simethicone (PHAZYME PO) Take 1 tablet by mouth at bedtime as needed (for gas).   sodium chloride (OCEAN) 0.65 % nasal spray Place 1 spray into the nose daily.   triamcinolone cream (KENALOG) 0.1 % Apply topically 2 (two) times daily. To affected area/ rash   venlafaxine XR (EFFEXOR XR) 75 MG 24 hr capsule Take 1 capsule (75 mg total) by mouth daily after  breakfast.    Allergies  Allergen Reactions   Clarithromycin Swelling and Other (See Comments)    Throat Swells   Penicillins Hives and Other (See Comments)    WELTS Has patient had a PCN reaction causing immediate rash, facial/tongue/throat swelling, SOB or lightheadedness with hypotension: No Has patient had a PCN reaction causing severe rash involving mucus  membranes or skin necrosis: Unknown Has patient had a PCN reaction that required hospitalization:No Has patient had a PCN reaction occurring within the last 10 years: No If all of the above answers are "NO", then may proceed with Cephalosporin use.    Codeine Nausea And Vomiting    SOCIAL HISTORY/FAMILY HISTORY   Reviewed in Epic:  Pertinent findings:  Social History   Tobacco Use   Smoking status: Never   Smokeless tobacco: Never  Vaping Use   Vaping Use: Never used  Substance Use Topics   Alcohol use: No    Alcohol/week: 0.0 standard drinks of alcohol   Drug use: No   Social History   Social History Narrative   She is relatively recently remarried.  She has 2  children and 3 grandchildren.     She is currently retired.  But enjoys cleaning house and doing chores.  She likes to do yard work.  Does not routinely exercise.    OBJCTIVE -PE, EKG, labs   Wt Readings from Last 3 Encounters:  05/07/22 179 lb 3.2 oz (81.3 kg)  04/23/22 180 lb 2 oz (81.7 kg)  03/30/22 179 lb 9.6 oz (81.5 kg)    Physical Exam: BP 126/68 (BP Location: Left Arm, Patient Position: Sitting, Cuff Size: Large)   Pulse 72   Ht _0  (1.626 m)   Wt 179 lb 3.2 oz (81.3 kg)   SpO2 96%   BMI 30.76 kg/m  Physical Exam   Adult ECG Report  Rate: *** ;  Rhythm: {rhythm:17366};   Narrative Interpretation: ***  Recent Labs:  ***  Lab Results  Component Value Date   CHOL 179 11/23/2021   HDL 48.80 11/23/2021   LDLCALC 100 (H) 11/23/2021   TRIG 153.0 (H) 11/23/2021   CHOLHDL 4 11/23/2021   Lab Results  Component Value Date    CREATININE 0.64 11/23/2021   BUN 10 11/23/2021   NA 142 11/23/2021   K 4.0 11/23/2021   CL 104 11/23/2021   CO2 30 11/23/2021      Latest Ref Rng & Units 11/23/2021    8:53 AM 11/25/2020    8:57 AM 11/20/2019    8:58 AM  CBC  WBC 4.0 - 10.5 K/uL 4.0  4.9  4.4   Hemoglobin 12.0 - 15.0 g/dL 12.5  12.1  12.7   Hematocrit 36.0 - 46.0 % 36.0  34.5  36.3   Platelets 150.0 - 400.0 K/uL 197.0  195.0  185.0     Lab Results  Component Value Date   HGBA1C 5.1 04/24/2019   Lab Results  Component Value Date   TSH 2.22 11/23/2021    ================================================== I spent a total of ***minutes with the patient spent in direct patient consultation.  Additional time spent with chart review  / charting (studies, outside notes, etc): *** min Total Time: *** min  Current medicines are reviewed at length with the patient today.  (+/- concerns) ***  Notice: This dictation was prepared with Dragon dictation along with smart phrase technology. Any transcriptional errors that result from this process are unintentional and may not be corrected upon review.  Studies Ordered:   No orders of the defined types were placed in this encounter.  No orders of the defined types were placed in this encounter.   Patient Instructions / Medication Changes & Studies & Tests Ordered   There are no Patient Instructions on file for this visit.     Leonie Man, MD, MS Glenetta Hew, M.D.,  M.S. Interventional Cardiologist  [facility]  Pager # [phone] Phone # [phone] [address] [place], [place] [address]   Thank you for choosing [facility]!!

## 2022-05-07 NOTE — Patient Instructions (Addendum)
Medication Instructions:    No changes  *If you need a refill on your cardiac medications before your next appointment, please call your pharmacy*   Lab Work:  Not needed  If you have labs (blood work) drawn today and your tests are completely normal, you will receive your results only by: [place] (if you have MyChart) OR A paper copy in the mail If you have any lab test that is abnormal or we need to change your treatment, we will call you to review the results.   Testing/Procedures:  Will be schedule at [place] has requested that you have an echocardiogram. Echocardiography is a painless test that uses sound waves to create images of your heart. It provides your doctor with information about the size and shape of your heart and how well your heart's chambers and valves are working. This procedure takes approximately one hour. There are no restrictions for this procedure. Please do NOT wear cologne, perfume, aftershave, or lotions (deodorant is allowed). Please arrive 15 minutes prior to your appointment time.   Follow-Up: At [facility], you and your health needs are our priority.  As part of our continuing mission to provide you with exceptional heart care, we have created designated Provider Care Teams.  These Care Teams include your primary Cardiologist (physician) and Advanced Practice Providers (APPs -  Physician Assistants and Nurse Practitioners) who all work together to provide you with the care you need, when you need it.  We recommend signing up for the patient portal called "MyChart".  Sign up information is provided on this After Visit Summary.  MyChart is used to connect with patients for Virtual Visits (Telemedicine).  Patients are able to view lab/test results, encounter notes, upcoming appointments, etc.  Non-urgent messages can be sent to your provider as well.   To learn more about what you can do with MyChart, go  to [email or web address].    Your next appointment:   6 month(s)  The format for your next appointment:   In Person  Provider:    With Diona Browner ,NP and 12 months with  Dr Glenetta Hew, MD    Other Instructions   From a cardiac standpoint okay to have surgery  05/22/22

## 2022-05-08 ENCOUNTER — Encounter: Payer: Self-pay | Admitting: Cardiology

## 2022-05-08 NOTE — Assessment & Plan Note (Signed)
3 years out from TAVR.  Has been stable, NYHA class I symptoms.  Due for follow-up echocardiogram this year. She is on stable dose of Toprol and amlodipine.  Has had some mild swelling and therefore we in dejected 3 days a week of furosemide between her HCTZ dosing.  Discussed importance of SBE prophylaxis.

## 2022-05-08 NOTE — Assessment & Plan Note (Signed)
BP stable today on current dose of amlodipine and Toprol.  She takes intermittent furosemide HCTZ (4 days a week HCTZ and 3 days a week furosemide to keep swelling down.)

## 2022-05-08 NOTE — Assessment & Plan Note (Signed)
Still try to avoid statins.  LDL is 100 last check.  Continue to monitor.  Could consider red yeast rice as a medication option.  She is getting continue to monitor her diet.

## 2022-05-08 NOTE — Assessment & Plan Note (Signed)
Doing well now status post TAVR. Due for annual follow-up echo.  Has been stable in the past.  NYHA Class I CHF symptoms.

## 2022-05-08 NOTE — Assessment & Plan Note (Signed)
Clearance provided by Coletta Memos, NP and Dr. Glori Bickers for upcoming R TKA  Okay to hold aspirin 5 to 7 days preop.  No need for further cardiac evaluation preop.

## 2022-05-09 NOTE — Care Plan (Signed)
Ortho Bundle Case Management Note  Patient Details  Name: Veronica Greene MRN: [id number] Date of Birth: 05-03-1946   met with patient and SO in the office for H&P visit. She will discharge to home with his assistance. has equipment at home. OPPT set up with [facility]. discharge instructions discussed and questions answered. appointments confirmed   . Patient and MD in agreement with plan. Choice offered                DME Arranged:  [facility] DME Agency:  [employer]  HH Arranged:    [place] Agency:     Additional Comments: Please contact me with any questions of if this plan should need to change.  Ladell Heads,  [place] Specialist  [phone] 05/09/2022, 1:50 PM

## 2022-05-14 NOTE — Patient Instructions (Signed)
DUE TO COVID-19 ONLY TWO VISITORS  (aged 77 and older)  ARE ALLOWED TO COME WITH YOU AND STAY IN THE WAITING ROOM ONLY DURING PRE OP AND PROCEDURE.   **NO VISITORS ARE ALLOWED IN THE SHORT STAY AREA OR RECOVERY ROOM!!**  IF YOU WILL BE ADMITTED INTO THE HOSPITAL YOU ARE ALLOWED ONLY FOUR SUPPORT PEOPLE DURING VISITATION HOURS ONLY (7 AM -8PM)   The support person(s) must pass our screening, gel in and out, and wear a mask at all times, including in the patient's room. Patients must also wear a mask when staff or their support person are in the room. Visitors GUEST BADGE MUST BE WORN VISIBLY  One adult visitor may remain with you overnight and MUST be in the room by 8 P.M.     Your procedure is scheduled on: 05/22/22   Report to [facility] Main Entrance    Report to admitting at : 10:00 AM   Call this number if you have problems the morning of surgery [phone]   Do not eat food :After Midnight.   After Midnight you may have the following liquids until : 9:30 AM DAY OF SURGERY  Water Black Coffee (sugar ok, NO MILK/CREAM OR CREAMERS)  Tea (sugar ok, NO MILK/CREAM OR CREAMERS) regular and decaf                             Plain Jell-O (NO RED)                                           Fruit ices (not with fruit pulp, NO RED)                                     Popsicles (NO RED)                                                                  Juice: apple, WHITE grape, WHITE cranberry Sports drinks like Gatorade (NO RED)              Drink  Ensure drink AT:  9:30 AM the day of surgery.    The day of surgery:  Drink ONE (1) Pre-Surgery Clear Ensure or G2 at AM the morning of surgery. Drink in one sitting. Do not sip.  This drink was given to you during your hospital  pre-op appointment visit. Nothing else to drink after completing the  Pre-Surgery Clear Ensure or G2.          If you have questions, please contact your surgeon's office.   Oral Hygiene is also  important to reduce your risk of infection.                                    Remember - BRUSH YOUR TEETH THE MORNING OF SURGERY WITH YOUR REGULAR TOOTHPASTE   Do NOT smoke after Midnight   Take these medicines the morning of surgery with A SIP  OF WATER:venlafaxine,cetirizine,amlodipine.Tylenol,meclizine as needed.Flonase as usual.                               You may not have any metal on your body including hair pins, jewelry, and body piercing             Do not wear make-up, lotions, powders, perfumes/cologne, or deodorant  Do not wear nail polish including gel and S&S, artificial/acrylic nails, or any other type of covering on natural nails including finger and toenails. If you have artificial nails, gel coating, etc. that needs to be removed by a nail salon please have this removed prior to surgery or surgery may need to be canceled/ delayed if the surgeon/ anesthesia feels like they are unable to be safely monitored.   Do not shave  48 hours prior to surgery.    Do not bring valuables to the hospital. [place].   Contacts, dentures or bridgework may not be worn into surgery.   Bring small overnight bag day of surgery.   DO NOT [place]. PHARMACY WILL DISPENSE MEDICATIONS LISTED ON YOUR MEDICATION LIST TO YOU DURING YOUR ADMISSION [place]!    Patients discharged on the day of surgery will not be allowed to drive home.  Someone NEEDS to stay with you for the first 24 hours after anesthesia.   Special Instructions: Bring a copy of your healthcare power of attorney and living will documents         the day of surgery if you haven't scanned them before.              Please read over the following fact sheets you were given: IF YOU HAVE QUESTIONS ABOUT YOUR PRE-OP INSTRUCTIONS PLEASE CALL [phone]    [facility] - Preparing for Surgery Before surgery, you can play an important role.   Because skin is not sterile, your skin needs to be as free of germs as possible.  You can reduce the number of germs on your skin by washing with CHG (chlorahexidine gluconate) soap before surgery.  CHG is an antiseptic cleaner which kills germs and bonds with the skin to continue killing germs even after washing. Please DO NOT use if you have an allergy to CHG or antibacterial soaps.  If your skin becomes reddened/irritated stop using the CHG and inform your nurse when you arrive at Short Stay. Do not shave (including legs and underarms) for at least 48 hours prior to the first CHG shower.  You may shave your face/neck. Please follow these instructions carefully:  1.  Shower with CHG Soap the night before surgery and the  morning of Surgery.  2.  If you choose to wash your hair, wash your hair first as usual with your  normal  shampoo.  3.  After you shampoo, rinse your hair and body thoroughly to remove the  shampoo.                           4.  Use CHG as you would any other liquid soap.  You can apply chg directly  to the skin and wash  Gently with a scrungie or clean washcloth.  5.  Apply the CHG Soap to your body ONLY FROM THE NECK DOWN.   Do not use on face/ open                           Wound or open sores. Avoid contact with eyes, ears mouth and genitals (private parts).                       Wash face,  Genitals (private parts) with your normal soap.             6.  Wash thoroughly, paying special attention to the area where your surgery  will be performed.  7.  Thoroughly rinse your body with warm water from the neck down.  8.  DO NOT shower/wash with your normal soap after using and rinsing off  the CHG Soap.                9.  Pat yourself dry with a clean towel.            10.  Wear clean pajamas.            11.  Place clean sheets on your bed the night of your first shower and do not  sleep with pets. Day of Surgery : Do not apply any lotions/deodorants the  morning of surgery.  Please wear clean clothes to the hospital/surgery center.  FAILURE TO FOLLOW THESE INSTRUCTIONS MAY RESULT IN THE CANCELLATION OF YOUR SURGERY PATIENT SIGNATURE_________________________________  NURSE SIGNATURE__________________________________  ________________________________________________________________________  Veronica Greene  An incentive spirometer is a tool that can help keep your lungs clear and active. This tool measures how well you are filling your lungs with each breath. Taking long deep breaths may help reverse or decrease the chance of developing breathing (pulmonary) problems (especially infection) following: A long period of time when you are unable to move or be active. BEFORE THE PROCEDURE  If the spirometer includes an indicator to show your best effort, your nurse or respiratory therapist will set it to a desired goal. If possible, sit up straight or lean slightly forward. Try not to slouch. Hold the incentive spirometer in an upright position. INSTRUCTIONS FOR USE  Sit on the edge of your bed if possible, or sit up as far as you can in bed or on a chair. Hold the incentive spirometer in an upright position. Breathe out normally. Place the mouthpiece in your mouth and seal your lips tightly around it. Breathe in slowly and as deeply as possible, raising the piston or the ball toward the top of the column. Hold your breath for 3-5 seconds or for as long as possible. Allow the piston or ball to fall to the bottom of the column. Remove the mouthpiece from your mouth and breathe out normally. Rest for a few seconds and repeat Steps 1 through 7 at least 10 times every 1-2 hours when you are awake. Take your time and take a few normal breaths between deep breaths. The spirometer may include an indicator to show your best effort. Use the indicator as a goal to work toward during each repetition. After each set of 10 deep breaths, practice coughing  to be sure your lungs are clear. If you have an incision (the cut made at the time of surgery), support your incision when coughing by placing a pillow or rolled up towels firmly  against it. Once you are able to get out of bed, walk around indoors and cough well. You may stop using the incentive spirometer when instructed by your caregiver.  RISKS AND COMPLICATIONS Take your time so you do not get dizzy or light-headed. If you are in pain, you may need to take or ask for pain medication before doing incentive spirometry. It is harder to take a deep breath if you are having pain. AFTER USE Rest and breathe slowly and easily. It can be helpful to keep track of a log of your progress. Your caregiver can provide you with a simple table to help with this. If you are using the spirometer at home, follow these instructions: [place] IF:  You are having difficultly using the spirometer. You have trouble using the spirometer as often as instructed. Your pain medication is not giving enough relief while using the spirometer. You develop fever of 100.5 F (38.1 C) or higher. SEEK IMMEDIATE MEDICAL CARE IF:  You cough up bloody sputum that had not been present before. You develop fever of 102 F (38.9 C) or greater. You develop worsening pain at or near the incision site. MAKE SURE YOU:  Understand these instructions. Will watch your condition. Will get help right away if you are not doing well or get worse. Document Released: 11/05/2006 Document Revised: 09/17/2011 Document Reviewed: 01/06/2007 [facility] Patient Information 2014 [place], [place].   ________________________________________________________________________

## 2022-05-16 ENCOUNTER — Other Ambulatory Visit: Payer: Self-pay

## 2022-05-16 ENCOUNTER — Encounter (HOSPITAL_COMMUNITY)
Admission: RE | Admit: 2022-05-16 | Discharge: 2022-05-16 | Disposition: A | Discharge status: Home / Self Care | Payer: PPO | Source: Ambulatory Visit | Attending: Orthopedic Surgery | Admitting: Orthopedic Surgery

## 2022-05-16 ENCOUNTER — Encounter (HOSPITAL_COMMUNITY): Payer: Self-pay

## 2022-05-16 VITALS — BP 145/53 | HR 64 | Temp 98.0°F | Ht 64.0 in | Wt 179.2 lb

## 2022-05-16 DIAGNOSIS — Z01812 Encounter for preprocedural laboratory examination: Secondary | ICD-10-CM | POA: Diagnosis not present

## 2022-05-16 DIAGNOSIS — I10 Essential (primary) hypertension: Secondary | ICD-10-CM | POA: Diagnosis not present

## 2022-05-16 DIAGNOSIS — Z01818 Encounter for other preprocedural examination: Secondary | ICD-10-CM

## 2022-05-16 LAB — CBC
HCT: 35 % — ABNORMAL LOW (ref 36.0–46.0)
Hemoglobin: 11.8 g/dL — ABNORMAL LOW (ref 12.0–15.0)
MCH: 30.9 pg (ref 26.0–34.0)
MCHC: 33.7 g/dL (ref 30.0–36.0)
MCV: 91.6 fL (ref 80.0–100.0)
Platelets: 221 10*3/uL (ref 150–400)
RBC: 3.82 MIL/uL — ABNORMAL LOW (ref 3.87–5.11)
RDW: 12.4 % (ref 11.5–15.5)
WBC: 4.4 10*3/uL (ref 4.0–10.5)
nRBC: 0 % (ref 0.0–0.2)

## 2022-05-16 LAB — BASIC METABOLIC PANEL
Anion gap: 9 (ref 5–15)
BUN: 13 mg/dL (ref 8–23)
CO2: 26 mmol/L (ref 22–32)
Calcium: 9.8 mg/dL (ref 8.9–10.3)
Chloride: 108 mmol/L (ref 98–111)
Creatinine, Ser: 0.54 mg/dL (ref 0.44–1.00)
GFR, Estimated: >60 mL/min (ref >60)
Glucose, Bld: 91 mg/dL (ref 70–99)
Potassium: 4 mmol/L (ref 3.5–5.1)
Sodium: 143 mmol/L (ref 135–145)

## 2022-05-16 LAB — SURGICAL PCR SCREEN
MRSA, PCR: NEGATIVE
Staphylococcus aureus: NEGATIVE

## 2022-05-16 NOTE — Progress Notes (Signed)
For Short Stay: [place] appointment date:  Bowel Prep reminder:   For Anesthesia: PCP - Dr. Loura Pardon Cardiologist - Dr. Glenetta Hew. LOV: 05/07/22  Chest x-ray -  EKG - 03/29/22 Stress Test -  ECHO - 05/15/21 Cardiac Cath -  Pacemaker/ICD device last checked: Pacemaker orders received: Device Rep notified:  Spinal Cord Stimulator:  Sleep Study -  CPAP -   Fasting Blood Sugar -  Checks Blood Sugar _____ times a day Date and result of last Hgb A1c-  Last dose of GLP1 agonist-  GLP1 instructions:   Last dose of SGLT-2 inhibitors-  SGLT-2 instructions:   Blood Thinner Instructions: Aspirin Instructions: Last Dose:  Activity level: Can go up a flight of stairs and activities of daily living without stopping and without chest pain and/or shortness of breath   Able to exercise without chest pain and/or shortness of breath   Unable to go up a flight of stairs without chest pain and/or shortness of breath     Anesthesia review: Hx: HTN,Severe aorta stenosis.  Patient denies shortness of breath, fever, cough and chest pain at PAT appointment   Patient verbalized understanding of instructions that were given to them at the PAT appointment. Patient was also instructed that they will need to review over the PAT instructions again at home before surgery.

## 2022-05-17 NOTE — Progress Notes (Signed)
Anesthesia Chart Review   Case: [id number] Date/Time: 05/22/22 1220   Procedure: TOTAL KNEE ARTHROPLASTY (Right: Knee)   Anesthesia type: Choice   Pre-op diagnosis: OA RIGHT KNEE   Location: [facility] ROOM 08 / [facility]   Surgeons: [name], MD       DISCUSSION:76 y.o. never smoker with h/o HTN, s/p TAVR 04/2019, right knee OA scheduled for above procedure 05/22/2022 with Dr. Edmonia Lynch.   Pt last seen by cardiology 05/07/2022. Per OV note, "From a cardiac standpoint okay to have surgery  05/22/22"  Per cardiology note 03/29/2022, "Chart reviewed as part of pre-operative protocol coverage. Given past medical history and time since last visit, based on ACC/AHA guidelines, Charnel Giles would be at acceptable risk for the planned procedure without further cardiovascular testing.    Patient was advised that if she develops new symptoms prior to surgery to contact our office to arrange a follow-up appointment.  He verbalized understanding.   Her RCRI is a class II risk, 0.9% risk of major cardiac event.  She is able to complete greater than 4 METS of physical activity."  Anticipate pt can proceed with planned procedure barring acute status change.   VS: BP (!) 145/53   Pulse 64   Temp 36.7 C (Oral)   Ht _0  (1.626 m)   Wt 81.3 kg   SpO2 95%   BMI 30.76 kg/m   PROVIDERS: Tower, [name], MD is PCP   Cardiologist - Dr. Glenetta Hew  LABS: Labs reviewed: Acceptable for surgery. (all labs ordered are listed, but only abnormal results are displayed)  Labs Reviewed  CBC - Abnormal; Notable for the following components:      Result Value   RBC 3.82 (*)    Hemoglobin 11.8 (*)    HCT 35.0 (*)    All other components within normal limits  SURGICAL PCR SCREEN  BASIC METABOLIC PANEL     IMAGES:   EKG:   CV: Echo 05/15/2021  1. Left ventricular ejection fraction, by estimation, is 65 to 70%. The  left ventricle has normal function. The left ventricle has no  regional  wall motion abnormalities. Left ventricular diastolic parameters are  consistent with Grade II diastolic  dysfunction (pseudonormalization).   2. Right ventricular systolic function is normal. The right ventricular  size is normal. There is normal pulmonary artery systolic pressure.   3. Left atrial size was moderately dilated.   4. Right atrial size was mildly dilated.   5. The mitral valve is degenerative. Mild mitral valve regurgitation. No  evidence of mitral stenosis. Severe mitral annular calcification.   6. The aortic valve has been repaired/replaced. Aortic valve  regurgitation is trivial. There is a 23 mm Sapien prosthetic (TAVR) valve  present in the aortic position. Procedure Date: 04/29/2019. Aortic valve  area, by VTI measures 1.57 cm. Aortic valve  mean gradient measures 18.0 mmHg. Aortic valve Vmax measures 2.83 m/s.   7. The inferior vena cava is normal in size with greater than 50%  respiratory variability, suggesting right atrial pressure of 3 mmHg.  Past Medical History:  Diagnosis Date   Anxiety and depression 08/26/2007   Qualifier: Diagnosis of  By: Glori Bickers MD, Carmell Austria    Arthritis    knees   Constipation, slow transit 11/03/2010   Degenerative disk disease 08/31/2011   Depression with anxiety    DERMATITIS, SEBORRHEIC 08/25/2010   Qualifier: Diagnosis of  By: Glori Bickers MD, Carmell Austria    Endometrial  cancer [facility])    02/ 2019  Diagnosed with D&C/hysteroscopy with polypectomy   Essential hypertension 08/29/2017   Fibromyalgia    tx mobic   Full dentures    GERD 08/26/2007   Qualifier: Diagnosis of  By: Glori Bickers MD, Carmell Austria    Gout    Hearing loss    wears bilateral hearing aids   History of colon polyps    History of nonmelanoma skin cancer    right lower leg   History of poliomyelitis 08/26/2007   Qualifier: History of  By: Glori Bickers MD, Carmell Austria    History of radiation therapy    Endometium- 11/20/17-12/11/17- Vag Cuff -Dr. Gery Pray    Hyperlipidemia, mild 09/02/2012   Hypertension    Hypokalemia 01/02/2011   IBS (irritable bowel syndrome)    with constipation   Obesity 08/26/2007   Qualifier: Diagnosis of  By: Glori Bickers MD, Carmell Austria    OSTEOARTHRITIS, HANDS, BILATERAL 08/26/2007   Qualifier: Diagnosis of  By: Glori Bickers MD, Carmell Austria    Osteopenia    OVERACTIVE BLADDER 08/26/2007   Qualifier: Diagnosis of  By: Glori Bickers MD, Carmell Austria    Personal history of colonic polyps 11/03/2010   05/2018 13 polyps mostly adenomas, ssp's - recall 1 year 2020 Gatha Mayer, MD, [facility]    Poor balance 11/25/2018   With falls  Has rise in R shoe/post polio  Weak legs as well     S/P TAVR (transcatheter aortic valve replacement)    s/p TAVR with a 64m Edwards S3U via the TF approach by Dr. BCyndia Bentand Dr. MAngelena Form  Severe calcific aortic stenosis 08/27/2017    -> Became severe as of October 2020-referred for TAVR on 04/28/2019   URINARY INCONTINENCE 08/26/2007   Qualifier: Diagnosis of  By: TGlori BickersMD, MCarmell Austria   Wears hearing aid in both ears     Past Surgical History:  Procedure Laterality Date   ANKLE SURGERY Right 1KeeneEXTRACTION Right 11/16/2019   COLONOSCOPY N/A 06/24/2015   Procedure: COLONOSCOPY;  Surgeon: CGatha Mayer MD;  Location: [facility];  Service: Endoscopy;  Laterality: N/A;   DILATATION & CURETTAGE/HYSTEROSCOPY WITH MYOSURE N/A 08/09/2017   Procedure: DILATATION & CURETTAGE/HYSTEROSCOPY WITH MYOSURE;  Surgeon: CChristophe Louis MD;  [facility];  Service: Gynecology;  Laterality: N/A;  Polypectomy   DILATION AND CURETTAGE OF UTERUS     PMB   HAND SURGERY Bilateral 1999   Thumbs    KNEE SURGERY Bilateral 1998   x2   LYMPH NODE BIOPSY N/A 10/08/2017   Procedure: LYMPH NODE BIOPSY;  Surgeon: REveritt Amber MD;  Location: [facility];  Service: Gynecology;  Laterality: N/A;   MULTIPLE TOOTH EXTRACTIONS     NASAL SINUS SURGERY  1976   polio surgery.     right foot -  right leg 1.5" shorter than left leg - some wekness   RIGHT/LEFT HEART CATH AND CORONARY ANGIOGRAPHY N/A 04/09/2019   Procedure: RIGHT/LEFT HEART CATH AND CORONARY ANGIOGRAPHY;  Surgeon: HLeonie Man MD;  [facility];  Service: Cardiovascular;; Severe AS, mean gradient 50 mmHg P-P 55 mmHg (estimated AVA 0.76 cm).  Mildly elevated pulmonary pressures with elevated LVEDP and PCWP.  Angiographically normal coronary arteries, but tortuous.   ROBOTIC ASSISTED TOTAL HYSTERECTOMY WITH BILATERAL SALPINGO OOPHERECTOMY Bilateral 10/08/2017   Procedure: XI ROBOTIC ASSISTED TOTAL HYSTERECTOMY WITH BILATERAL SALPINGO OOPHORECTOMY;  Surgeon: REveritt Amber MD;  Location:  [facility];  Service: Gynecology;  Laterality: Bilateral;   TEE WITHOUT CARDIOVERSION N/A 04/28/2019   Procedure: TRANSESOPHAGEAL ECHOCARDIOGRAM (TEE);  Surgeon: Burnell Blanks, MD;  Location: [facility];  Service: Open Heart Surgery;  Laterality: N/A;   THUMB ARTHROSCOPY  1999   TONSILLECTOMY  1044   76 years old   TRANSCATHETER AORTIC VALVE REPLACEMENT, TRANSFEMORAL N/A 04/28/2019   Procedure: TRANSCATHETER AORTIC VALVE REPLACEMENT, TRANSFEMORAL;  Surgeon: Burnell Blanks, MD;  [facility]: [facility]; ; 23 mm Sapien 3 Ultra THV via TF approach. (post-op mean AV gradient 13 mmHg)   TRANSTHORACIC ECHOCARDIOGRAM  05/15/2021   Normal/stable LV size and function.  EF 65 to 70%.  No RWMA.  GR 2 DD.  Moderate LA dilation.  Normal RV with mild RA dilation, normal RAP.Marland Kitchen  Severe MAC with no MR.  23 mm SAPIEN prosthetic TAVR valve well-seated.  Mean AVG 18 mm..  Trivial AI.  (No significant change from 05/04/2020-mean AVG 14 mmHg).   TRANSTHORACIC ECHOCARDIOGRAM  02/17/2019   Mod LVH. GR 2 DD. Severe AS (mean gradient = 44 mmHg with Mod AI. mild MS.   TRANSTHORACIC ECHOCARDIOGRAM  04/29/2019; 05/2020   a)  (post TAVR): EF 60-65%.  GRII DD.  Mod LA dilation & nl RA.  Mod MAC.  No  AI (no-PVL), mean AV gradient  13 mmHg.;; b) (2nd post TAVR) EF 60 to 65%.  Severely increased thickness/LVH.  GRII DD with only mildly elevated left atrial pressure.  Normal RA size.  Severe MAC with only trace MR.  No MS.  TAVR valve prosthesis functioning well.  Trivial AI.  Mean gradient 15 mmHg with a peak 29.8 mmHg   TUBAL LIGATION     UPPER GI ENDOSCOPY     normal   WISDOM TOOTH EXTRACTION      MEDICATIONS:  aspirin EC 81 MG tablet   acetaminophen (TYLENOL) 500 MG tablet   acetic acid-hydrocortisone (VOSOL-HC) OTIC solution   amLODipine (NORVASC) 10 MG tablet   Ascorbic Acid (VITAMIN C) 1000 MG tablet   Black Pepper-Turmeric (TURMERIC COMPLEX/BLACK PEPPER PO)   Calcium-Phosphorus-Vitamin D (CALCIUM/VITAMIN D3/ADULT GUMMY PO)   cetirizine (ZYRTEC) 10 MG tablet   cholecalciferol (VITAMIN D) 1000 UNITS tablet   fluticasone (FLONASE) 50 MCG/ACT nasal spray   furosemide (LASIX) 20 MG tablet   hydrochlorothiazide (HYDRODIURIL) 25 MG tablet   lubiprostone (AMITIZA) 24 MCG capsule   meclizine (ANTIVERT) 25 MG tablet   meloxicam (MOBIC) 7.5 MG tablet   metoprolol succinate (TOPROL-XL) 25 MG 24 hr tablet   mupirocin ointment (BACTROBAN) 2 %   NEXIUM 40 MG capsule   Polyethyl Glycol-Propyl Glycol 0.4-0.3 % SOLN   potassium chloride (KLOR-CON) 10 MEQ tablet   sodium chloride (OCEAN) 0.65 % nasal spray   triamcinolone cream (KENALOG) 0.1 %   venlafaxine XR (EFFEXOR XR) 75 MG 24 hr capsule   No current facility-administered medications for this encounter.    Konrad Felix Ward, PA-C [facility] [phone]

## 2022-05-17 NOTE — Anesthesia Preprocedure Evaluation (Addendum)
Anesthesia Evaluation  Patient identified by MRN, date of birth, ID band Patient awake    Reviewed: Allergy & Precautions, NPO status , Patient's Chart, lab work & pertinent test results  History of Anesthesia Complications Negative for: history of anesthetic complications  Airway Mallampati: III  TM Distance: >3 FB Neck ROM: Full    Dental  (+) Upper Dentures, Lower Dentures, Dental Advisory Given   Pulmonary neg pulmonary ROS   breath sounds clear to auscultation       Cardiovascular hypertension, Pt. on medications and Pt. on home beta blockers (-) angina (-) Past MI and (-) CHF  Rhythm:Regular  1. Left ventricular ejection fraction, by estimation, is 65 to 70%. The  left ventricle has normal function. The left ventricle has no regional  wall motion abnormalities. Left ventricular diastolic parameters are  consistent with Grade II diastolic  dysfunction (pseudonormalization).   2. Right ventricular systolic function is normal. The right ventricular  size is normal. There is normal pulmonary artery systolic pressure.   3. Left atrial size was moderately dilated.   4. Right atrial size was mildly dilated.   5. The mitral valve is degenerative. Mild mitral valve regurgitation. No  evidence of mitral stenosis. Severe mitral annular calcification.   6. The aortic valve has been repaired/replaced. Aortic valve  regurgitation is trivial. There is a 23 mm Sapien prosthetic (TAVR) valve  present in the aortic position. Procedure Date: 04/29/2019. Aortic valve  area, by VTI measures 1.57 cm. Aortic valve  mean gradient measures 18.0 mmHg. Aortic valve Vmax measures 2.83 m/s.   7. The inferior vena cava is normal in size with greater than 50%  respiratory variability, suggesting right atrial pressure of 3 mmHg.     Neuro/Psych  PSYCHIATRIC DISORDERS Anxiety Depression     Neuromuscular disease    GI/Hepatic Neg liver ROS,GERD  ,,   Endo/Other  negative endocrine ROS    Renal/GU negative Renal ROSLab Results      Component                Value               Date                      CREATININE               0.54                05/16/2022                Musculoskeletal  (+) Arthritis ,  Fibromyalgia -  Abdominal   Peds  Hematology  (+) Blood dyscrasia, anemia Lab Results      Component                Value               Date                      WBC                      4.4                 05/16/2022                HGB                      11.8 (L)  05/16/2022                HCT                      35.0 (L)            05/16/2022                MCV                      91.6                05/16/2022                PLT                      221                 05/16/2022              Anesthesia Other Findings   Reproductive/Obstetrics                              Anesthesia Physical Anesthesia Plan  ASA: 2  Anesthesia Plan: MAC, Regional and Spinal   Post-op Pain Management: Regional block*   Induction:   PONV Risk Score and Plan: 2 and Propofol infusion and Treatment may vary due to age or medical condition  Airway Management Planned: Nasal Cannula and Natural Airway  Additional Equipment: None  Intra-op Plan:   Post-operative Plan: Extubation in OR  Informed Consent: I have reviewed the patients History and Physical, chart, labs and discussed the procedure including the risks, benefits and alternatives for the proposed anesthesia with the patient or authorized representative who has indicated his/her understanding and acceptance.     Dental advisory given  Plan Discussed with: CRNA  Anesthesia Plan Comments: (See PAT note 05/16/2022)        Anesthesia Quick Evaluation

## 2022-05-22 ENCOUNTER — Ambulatory Visit (HOSPITAL_BASED_OUTPATIENT_CLINIC_OR_DEPARTMENT_OTHER): Payer: PPO | Admitting: Anesthesiology

## 2022-05-22 ENCOUNTER — Encounter (HOSPITAL_COMMUNITY)
Admission: RE | Disposition: A | Discharge status: Home / Self Care | Payer: Self-pay | Source: Ambulatory Visit | Attending: Orthopedic Surgery

## 2022-05-22 ENCOUNTER — Other Ambulatory Visit: Payer: Self-pay

## 2022-05-22 ENCOUNTER — Observation Stay (HOSPITAL_COMMUNITY): Payer: PPO

## 2022-05-22 ENCOUNTER — Ambulatory Visit (HOSPITAL_COMMUNITY): Payer: PPO | Admitting: Physician Assistant

## 2022-05-22 ENCOUNTER — Observation Stay (HOSPITAL_COMMUNITY)
Admission: RE | Admit: 2022-05-22 | Discharge: 2022-05-23 | Disposition: A | Discharge status: Home / Self Care | Payer: PPO | Source: Ambulatory Visit | Attending: Orthopedic Surgery | Admitting: Orthopedic Surgery

## 2022-05-22 ENCOUNTER — Encounter (HOSPITAL_COMMUNITY): Payer: Self-pay | Admitting: Orthopedic Surgery

## 2022-05-22 DIAGNOSIS — M1711 Unilateral primary osteoarthritis, right knee: Principal | ICD-10-CM | POA: Insufficient documentation

## 2022-05-22 DIAGNOSIS — G8918 Other acute postprocedural pain: Secondary | ICD-10-CM | POA: Diagnosis not present

## 2022-05-22 DIAGNOSIS — Z79899 Other long term (current) drug therapy: Secondary | ICD-10-CM | POA: Diagnosis not present

## 2022-05-22 DIAGNOSIS — Z96651 Presence of right artificial knee joint: Secondary | ICD-10-CM | POA: Diagnosis not present

## 2022-05-22 DIAGNOSIS — F418 Other specified anxiety disorders: Secondary | ICD-10-CM | POA: Diagnosis not present

## 2022-05-22 DIAGNOSIS — I10 Essential (primary) hypertension: Secondary | ICD-10-CM | POA: Diagnosis not present

## 2022-05-22 DIAGNOSIS — Z471 Aftercare following joint replacement surgery: Secondary | ICD-10-CM | POA: Diagnosis not present

## 2022-05-22 DIAGNOSIS — Z85828 Personal history of other malignant neoplasm of skin: Secondary | ICD-10-CM | POA: Diagnosis not present

## 2022-05-22 DIAGNOSIS — Z8542 Personal history of malignant neoplasm of other parts of uterus: Secondary | ICD-10-CM | POA: Diagnosis not present

## 2022-05-22 DIAGNOSIS — I080 Rheumatic disorders of both mitral and aortic valves: Secondary | ICD-10-CM | POA: Diagnosis not present

## 2022-05-22 HISTORY — PX: TOTAL KNEE ARTHROPLASTY: SHX125

## 2022-05-22 SURGERY — ARTHROPLASTY, KNEE, TOTAL
Anesthesia: Monitor Anesthesia Care | Site: Knee | Laterality: Right

## 2022-05-22 MED ORDER — CEFAZOLIN SODIUM-DEXTROSE 1-4 GM/50ML-% IV SOLN
1.0000 g | Freq: Four times a day (QID) | INTRAVENOUS | Status: AC
  Administered 2022-05-22 – 2022-05-23 (×2): 1 g via INTRAVENOUS
  Filled 2022-05-22: qty 50

## 2022-05-22 MED ORDER — OXYCODONE HCL 5 MG PO TABS: 10.0000 mg | ORAL_TABLET | ORAL | Status: DC | PRN

## 2022-05-22 MED ORDER — BISACODYL 10 MG RE SUPP: 10.0000 mg | Freq: Every day | RECTAL | Status: DC | PRN

## 2022-05-22 MED ORDER — ACETAMINOPHEN 500 MG PO TABS
ORAL_TABLET | ORAL | Status: AC
  Filled 2022-05-22: qty 2

## 2022-05-22 MED ORDER — DIPHENHYDRAMINE HCL 12.5 MG/5ML PO ELIX: 12.5000 mg | ORAL_SOLUTION | ORAL | Status: DC | PRN

## 2022-05-22 MED ORDER — BUPIVACAINE LIPOSOME 1.3 % IJ SUSP
INTRAMUSCULAR | Status: DC | PRN
  Administered 2022-05-22: 20 mL

## 2022-05-22 MED ORDER — FLUTICASONE PROPIONATE 50 MCG/ACT NA SUSP
2.0000 | Freq: Every day | NASAL | Status: DC
  Administered 2022-05-22: 2 via NASAL
  Filled 2022-05-22: qty 16

## 2022-05-22 MED ORDER — DEXAMETHASONE SODIUM PHOSPHATE 10 MG/ML IJ SOLN
INTRAMUSCULAR | Status: AC
  Filled 2022-05-22: qty 1

## 2022-05-22 MED ORDER — ALUM & MAG HYDROXIDE-SIMETH 200-200-20 MG/5ML PO SUSP: 30.0000 mL | ORAL | Status: DC | PRN

## 2022-05-22 MED ORDER — HYDROCHLOROTHIAZIDE 25 MG PO TABS
25.0000 mg | ORAL_TABLET | Freq: Every day | ORAL | Status: DC
  Administered 2022-05-23: 25 mg via ORAL
  Filled 2022-05-22: qty 1

## 2022-05-22 MED ORDER — HYDROMORPHONE HCL 1 MG/ML IJ SOLN
0.5000 mg | INTRAMUSCULAR | Status: DC | PRN
  Administered 2022-05-23 (×2): 0.5 mg via INTRAVENOUS
  Filled 2022-05-22 (×2): qty 1

## 2022-05-22 MED ORDER — CEFAZOLIN SODIUM-DEXTROSE 2-4 GM/100ML-% IV SOLN
2.0000 g | INTRAVENOUS | Status: AC
  Administered 2022-05-22: 2 g via INTRAVENOUS
  Filled 2022-05-22: qty 100

## 2022-05-22 MED ORDER — ONDANSETRON HCL 4 MG/2ML IJ SOLN: 4.0000 mg | Freq: Four times a day (QID) | INTRAMUSCULAR | Status: DC | PRN

## 2022-05-22 MED ORDER — METOPROLOL SUCCINATE ER 25 MG PO TB24
25.0000 mg | ORAL_TABLET | Freq: Every day | ORAL | Status: DC
  Administered 2022-05-22: 25 mg via ORAL
  Filled 2022-05-22: qty 1

## 2022-05-22 MED ORDER — ACETAMINOPHEN 325 MG PO TABS: 325.0000 mg | ORAL_TABLET | Freq: Four times a day (QID) | ORAL | Status: DC | PRN

## 2022-05-22 MED ORDER — LORATADINE 10 MG PO TABS
10.0000 mg | ORAL_TABLET | Freq: Every day | ORAL | Status: DC
  Administered 2022-05-22: 10 mg via ORAL
  Filled 2022-05-22 (×2): qty 1

## 2022-05-22 MED ORDER — LACTATED RINGERS IV SOLN: INTRAVENOUS | Status: DC

## 2022-05-22 MED ORDER — METOCLOPRAMIDE HCL 5 MG PO TABS: 5.0000 mg | ORAL_TABLET | Freq: Three times a day (TID) | ORAL | Status: DC | PRN

## 2022-05-22 MED ORDER — ROPIVACAINE HCL 7.5 MG/ML IJ SOLN
INTRAMUSCULAR | Status: DC | PRN
  Administered 2022-05-22: 20 mL via PERINEURAL

## 2022-05-22 MED ORDER — MENTHOL 3 MG MT LOZG: 1.0000 | LOZENGE | OROMUCOSAL | Status: DC | PRN

## 2022-05-22 MED ORDER — POLYETHYL GLYCOL-PROPYL GLYCOL 0.4-0.3 % OP SOLN: 1.0000 [drp] | Freq: Every day | OPHTHALMIC | Status: DC

## 2022-05-22 MED ORDER — PROPOFOL 10 MG/ML IV BOLUS
INTRAVENOUS | Status: DC | PRN
  Administered 2022-05-22 (×2): 30 mg via INTRAVENOUS

## 2022-05-22 MED ORDER — ONDANSETRON HCL 4 MG PO TABS: 4.0000 mg | ORAL_TABLET | Freq: Four times a day (QID) | ORAL | Status: DC | PRN

## 2022-05-22 MED ORDER — BUPIVACAINE-EPINEPHRINE 0.25% -1:200000 IJ SOLN
INTRAMUSCULAR | Status: DC | PRN
  Administered 2022-05-22: 30 mL

## 2022-05-22 MED ORDER — POVIDONE-IODINE 10 % EX SWAB: 2.0000 | Freq: Once | CUTANEOUS | Status: DC

## 2022-05-22 MED ORDER — MIDAZOLAM HCL 2 MG/2ML IJ SOLN
1.0000 mg | Freq: Once | INTRAMUSCULAR | Status: AC
  Administered 2022-05-22: 1 mg via INTRAVENOUS
  Filled 2022-05-22: qty 2

## 2022-05-22 MED ORDER — ONDANSETRON HCL 4 MG/2ML IJ SOLN
INTRAMUSCULAR | Status: DC | PRN
  Administered 2022-05-22: 4 mg via INTRAVENOUS

## 2022-05-22 MED ORDER — BUPIVACAINE LIPOSOME 1.3 % IJ SUSP: 20.0000 mL | Freq: Once | INTRAMUSCULAR | Status: DC

## 2022-05-22 MED ORDER — METHOCARBAMOL 500 MG PO TABS
500.0000 mg | ORAL_TABLET | Freq: Four times a day (QID) | ORAL | Status: DC | PRN
  Administered 2022-05-22 – 2022-05-23 (×3): 500 mg via ORAL
  Filled 2022-05-22 (×2): qty 1

## 2022-05-22 MED ORDER — DEXAMETHASONE SODIUM PHOSPHATE 10 MG/ML IJ SOLN
8.0000 mg | Freq: Once | INTRAMUSCULAR | Status: AC
  Administered 2022-05-22: 8 mg via INTRAVENOUS

## 2022-05-22 MED ORDER — POLYVINYL ALCOHOL 1.4 % OP SOLN
1.0000 [drp] | Freq: Every day | OPHTHALMIC | Status: DC
  Administered 2022-05-22: 1 [drp] via OPHTHALMIC
  Filled 2022-05-22: qty 15

## 2022-05-22 MED ORDER — PROPOFOL 1000 MG/100ML IV EMUL
INTRAVENOUS | Status: AC
  Filled 2022-05-22: qty 100

## 2022-05-22 MED ORDER — ORAL CARE MOUTH RINSE: 15.0000 mL | Freq: Once | OROMUCOSAL | Status: AC

## 2022-05-22 MED ORDER — ACETAMINOPHEN 500 MG PO TABS
1000.0000 mg | ORAL_TABLET | Freq: Four times a day (QID) | ORAL | Status: DC
  Administered 2022-05-23 (×2): 1000 mg via ORAL
  Filled 2022-05-22 (×2): qty 2

## 2022-05-22 MED ORDER — ACETAMINOPHEN 160 MG/5ML PO SOLN: 1000.0000 mg | Freq: Once | ORAL | Status: AC | PRN

## 2022-05-22 MED ORDER — 0.9 % SODIUM CHLORIDE (POUR BTL) OPTIME
TOPICAL | Status: DC | PRN
  Administered 2022-05-22: 1000 mL

## 2022-05-22 MED ORDER — MAGNESIUM CITRATE PO SOLN: 1.0000 | Freq: Once | ORAL | Status: DC | PRN

## 2022-05-22 MED ORDER — ACETAMINOPHEN 500 MG PO TABS
1000.0000 mg | ORAL_TABLET | Freq: Once | ORAL | Status: AC | PRN
  Administered 2022-05-22: 1000 mg via ORAL

## 2022-05-22 MED ORDER — DOCUSATE SODIUM 100 MG PO CAPS
100.0000 mg | ORAL_CAPSULE | Freq: Two times a day (BID) | ORAL | Status: DC
  Administered 2022-05-22: 100 mg via ORAL
  Filled 2022-05-22 (×2): qty 1

## 2022-05-22 MED ORDER — POLYETHYLENE GLYCOL 3350 17 G PO PACK: 17.0000 g | PACK | Freq: Every day | ORAL | Status: DC | PRN

## 2022-05-22 MED ORDER — VENLAFAXINE HCL ER 75 MG PO CP24
75.0000 mg | ORAL_CAPSULE | Freq: Every day | ORAL | Status: DC
  Administered 2022-05-23: 75 mg via ORAL
  Filled 2022-05-22: qty 1

## 2022-05-22 MED ORDER — ACETAMINOPHEN 500 MG PO TABS
1000.0000 mg | ORAL_TABLET | Freq: Once | ORAL | Status: DC
  Filled 2022-05-22: qty 2

## 2022-05-22 MED ORDER — PROPOFOL 500 MG/50ML IV EMUL
INTRAVENOUS | Status: DC | PRN
  Administered 2022-05-22: 100 ug/kg/min via INTRAVENOUS

## 2022-05-22 MED ORDER — ONDANSETRON HCL 4 MG/2ML IJ SOLN
INTRAMUSCULAR | Status: AC
  Filled 2022-05-22: qty 2

## 2022-05-22 MED ORDER — AMLODIPINE BESYLATE 10 MG PO TABS
10.0000 mg | ORAL_TABLET | Freq: Every day | ORAL | Status: DC
  Administered 2022-05-22: 10 mg via ORAL
  Filled 2022-05-22 (×2): qty 1

## 2022-05-22 MED ORDER — METHOCARBAMOL 500 MG IVPB - SIMPLE MED: 500.0000 mg | Freq: Four times a day (QID) | INTRAVENOUS | Status: DC | PRN

## 2022-05-22 MED ORDER — TRANEXAMIC ACID-NACL 1000-0.7 MG/100ML-% IV SOLN
1000.0000 mg | INTRAVENOUS | Status: AC
  Administered 2022-05-22 (×2): 1000 mg via INTRAVENOUS
  Filled 2022-05-22: qty 100

## 2022-05-22 MED ORDER — LUBIPROSTONE 24 MCG PO CAPS
24.0000 ug | ORAL_CAPSULE | Freq: Two times a day (BID) | ORAL | Status: DC
  Administered 2022-05-22 – 2022-05-23 (×2): 24 ug via ORAL
  Filled 2022-05-22 (×3): qty 1

## 2022-05-22 MED ORDER — BUPIVACAINE-EPINEPHRINE (PF) 0.5% -1:200000 IJ SOLN
INTRAMUSCULAR | Status: AC
  Filled 2022-05-22: qty 30

## 2022-05-22 MED ORDER — METOCLOPRAMIDE HCL 5 MG/ML IJ SOLN: 5.0000 mg | Freq: Three times a day (TID) | INTRAMUSCULAR | Status: DC | PRN

## 2022-05-22 MED ORDER — BUPIVACAINE LIPOSOME 1.3 % IJ SUSP
INTRAMUSCULAR | Status: AC
  Filled 2022-05-22: qty 20

## 2022-05-22 MED ORDER — SODIUM CHLORIDE 0.9% FLUSH
INTRAVENOUS | Status: DC | PRN
  Administered 2022-05-22: 30 mL

## 2022-05-22 MED ORDER — ASPIRIN 81 MG PO CHEW
81.0000 mg | CHEWABLE_TABLET | Freq: Two times a day (BID) | ORAL | Status: DC
  Administered 2022-05-23: 81 mg via ORAL
  Filled 2022-05-22: qty 1

## 2022-05-22 MED ORDER — METHOCARBAMOL 500 MG PO TABS
ORAL_TABLET | ORAL | Status: AC
  Filled 2022-05-22: qty 1

## 2022-05-22 MED ORDER — FENTANYL CITRATE PF 50 MCG/ML IJ SOSY
50.0000 ug | PREFILLED_SYRINGE | Freq: Once | INTRAMUSCULAR | Status: AC
  Administered 2022-05-22: 50 ug via INTRAVENOUS
  Filled 2022-05-22: qty 2

## 2022-05-22 MED ORDER — ACETAMINOPHEN 10 MG/ML IV SOLN: 1000.0000 mg | Freq: Once | INTRAVENOUS | Status: DC | PRN

## 2022-05-22 MED ORDER — FENTANYL CITRATE PF 50 MCG/ML IJ SOSY: 25.0000 ug | PREFILLED_SYRINGE | INTRAMUSCULAR | Status: DC | PRN

## 2022-05-22 MED ORDER — WATER FOR IRRIGATION, STERILE IR SOLN
Status: DC | PRN
  Administered 2022-05-22: 2000 mL

## 2022-05-22 MED ORDER — CEFAZOLIN SODIUM-DEXTROSE 1-4 GM/50ML-% IV SOLN
INTRAVENOUS | Status: AC
  Filled 2022-05-22: qty 50

## 2022-05-22 MED ORDER — TRANEXAMIC ACID-NACL 1000-0.7 MG/100ML-% IV SOLN
1000.0000 mg | Freq: Once | INTRAVENOUS | Status: AC
  Administered 2022-05-22: 1000 mg via INTRAVENOUS

## 2022-05-22 MED ORDER — PHENOL 1.4 % MT LIQD: 1.0000 | OROMUCOSAL | Status: DC | PRN

## 2022-05-22 MED ORDER — SODIUM CHLORIDE (PF) 0.9 % IJ SOLN
INTRAMUSCULAR | Status: AC
  Filled 2022-05-22: qty 30

## 2022-05-22 MED ORDER — OXYCODONE HCL 5 MG/5ML PO SOLN: 5.0000 mg | Freq: Once | ORAL | Status: DC | PRN

## 2022-05-22 MED ORDER — POTASSIUM CHLORIDE ER 10 MEQ PO TBCR
20.0000 meq | EXTENDED_RELEASE_TABLET | Freq: Two times a day (BID) | ORAL | Status: DC
  Administered 2022-05-22 – 2022-05-23 (×2): 20 meq via ORAL
  Filled 2022-05-22 (×4): qty 2

## 2022-05-22 MED ORDER — PANTOPRAZOLE SODIUM 40 MG PO TBEC
40.0000 mg | DELAYED_RELEASE_TABLET | Freq: Every day | ORAL | Status: DC
  Administered 2022-05-22: 40 mg via ORAL
  Filled 2022-05-22 (×2): qty 1

## 2022-05-22 MED ORDER — OXYCODONE HCL 5 MG PO TABS
5.0000 mg | ORAL_TABLET | ORAL | Status: DC | PRN
  Administered 2022-05-22 – 2022-05-23 (×2): 5 mg via ORAL
  Filled 2022-05-22 (×2): qty 1

## 2022-05-22 MED ORDER — SODIUM CHLORIDE 0.9 % IR SOLN
Status: DC | PRN
  Administered 2022-05-22: 1000 mL

## 2022-05-22 MED ORDER — DEXAMETHASONE SODIUM PHOSPHATE 10 MG/ML IJ SOLN
10.0000 mg | Freq: Once | INTRAMUSCULAR | Status: AC
  Administered 2022-05-23: 10 mg via INTRAVENOUS
  Filled 2022-05-22: qty 1

## 2022-05-22 MED ORDER — PROPOFOL 10 MG/ML IV BOLUS
INTRAVENOUS | Status: AC
  Filled 2022-05-22: qty 20

## 2022-05-22 MED ORDER — POVIDONE-IODINE 10 % EX SWAB
2.0000 | Freq: Once | CUTANEOUS | Status: AC
  Administered 2022-05-22: 2 via TOPICAL

## 2022-05-22 MED ORDER — OXYCODONE HCL 5 MG PO TABS: 5.0000 mg | ORAL_TABLET | Freq: Once | ORAL | Status: DC | PRN

## 2022-05-22 MED ORDER — CHLORHEXIDINE GLUCONATE 0.12 % MT SOLN
15.0000 mL | Freq: Once | OROMUCOSAL | Status: AC
  Administered 2022-05-22: 15 mL via OROMUCOSAL

## 2022-05-22 SURGICAL SUPPLY — 54 items
BAG COUNTER SPONGE SURGICOUNT (BAG) IMPLANT
BLADE HEX COATED 2.75 (ELECTRODE) ×1 IMPLANT
BLADE SAG 18X100X1.27 (BLADE) ×1 IMPLANT
BLADE SAGITTAL 25.0X1.37X90 (BLADE) ×1 IMPLANT
BLADE SURG 15 STRL LF DISP TIS (BLADE) ×1 IMPLANT
BLADE SURG 15 STRL SS (BLADE) ×1
BLADE SURG SZ10 CARB STEEL (BLADE) IMPLANT
BNDG ELASTIC 6X10 VLCR STRL LF (GAUZE/BANDAGES/DRESSINGS) ×1 IMPLANT
BNDG ELASTIC 6X15 VLCR STRL LF (GAUZE/BANDAGES/DRESSINGS) IMPLANT
CLSR STERI-STRIP ANTIMIC 1/2X4 (GAUZE/BANDAGES/DRESSINGS) ×1 IMPLANT
COMPONENT TRI CR RETAIN KNEE (Orthopedic Implant) IMPLANT
COVER SURGICAL LIGHT HANDLE (MISCELLANEOUS) ×1 IMPLANT
CUFF TOURN SGL QUICK 34 (TOURNIQUET CUFF) ×1
CUFF TRNQT CYL 34X4.125X (TOURNIQUET CUFF) ×1 IMPLANT
DRAPE U-SHAPE 47X51 STRL (DRAPES) ×1 IMPLANT
DRSG EMULSION OIL 3X3 NADH (GAUZE/BANDAGES/DRESSINGS) IMPLANT
DRSG MEPILEX POST OP 4X12 (GAUZE/BANDAGES/DRESSINGS) ×1 IMPLANT
DRSG TEGADERM 2-3/8X2-3/4 SM (GAUZE/BANDAGES/DRESSINGS) IMPLANT
DURAPREP 26ML APPLICATOR (WOUND CARE) ×2 IMPLANT
GAUZE SPONGE 2X2 8PLY STRL LF (GAUZE/BANDAGES/DRESSINGS) IMPLANT
GLOVE BIO SURGEON STRL SZ 6.5 (GLOVE) IMPLANT
GLOVE BIO SURGEON STRL SZ7.5 (GLOVE) ×2 IMPLANT
GLOVE BIOGEL PI IND STRL 7.0 (GLOVE) IMPLANT
GLOVE BIOGEL PI IND STRL 7.5 (GLOVE) ×1 IMPLANT
GLOVE BIOGEL PI IND STRL 8 (GLOVE) ×1 IMPLANT
GLOVE SURG SYN 7.5  E (GLOVE) ×1
GLOVE SURG SYN 7.5 E (GLOVE) ×1 IMPLANT
GLOVE SURG SYN 7.5 PF PI (GLOVE) ×1 IMPLANT
GOWN STRL REUS W/ TWL LRG LVL3 (GOWN DISPOSABLE) ×1 IMPLANT
GOWN STRL REUS W/ TWL XL LVL3 (GOWN DISPOSABLE) ×1 IMPLANT
GOWN STRL REUS W/TWL LRG LVL3 (GOWN DISPOSABLE) ×3
GOWN STRL REUS W/TWL XL LVL3 (GOWN DISPOSABLE) ×1
HANDPIECE INTERPULSE COAX TIP (DISPOSABLE) ×1
HOLDER FOLEY CATH W/STRAP (MISCELLANEOUS) IMPLANT
IMMOBILIZER KNEE 22 UNIV (SOFTGOODS) ×1 IMPLANT
INSERT X3 TRIATH SZ3 14 (Insert) IMPLANT
KNEE PATELLA ASYMMETRIC 9X29 (Knees) IMPLANT
KNEE TIBIAL COMPONENT SZ3 (Knees) IMPLANT
MANIFOLD NEPTUNE II (INSTRUMENTS) ×1 IMPLANT
NS IRRIG 1000ML POUR BTL (IV SOLUTION) ×1 IMPLANT
PACK TOTAL KNEE CUSTOM (KITS) ×1 IMPLANT
PIN FLUTED HEDLESS FIX 3.5X1/8 (PIN) IMPLANT
PROTECTOR NERVE ULNAR (MISCELLANEOUS) ×1 IMPLANT
SET HNDPC FAN SPRY TIP SCT (DISPOSABLE) ×1 IMPLANT
SPIKE FLUID TRANSFER (MISCELLANEOUS) ×1 IMPLANT
SUT MNCRL AB 3-0 PS2 18 (SUTURE) ×1 IMPLANT
SUT VIC AB 0 CT1 36 (SUTURE) ×1 IMPLANT
SUT VIC AB 1 CT1 36 (SUTURE) ×2 IMPLANT
SUT VIC AB 2-0 CT1 27 (SUTURE) ×1
SUT VIC AB 2-0 CT1 TAPERPNT 27 (SUTURE) ×1 IMPLANT
TRAY FOLEY MTR SLVR 14FR STAT (SET/KITS/TRAYS/PACK) IMPLANT
TRIA CRUCIATE RETAIN KNEE (Orthopedic Implant) ×1 IMPLANT
TUBE SUCTION HIGH CAP CLEAR NV (SUCTIONS) ×1 IMPLANT
WRAP KNEE MAXI GEL POST OP (GAUZE/BANDAGES/DRESSINGS) ×1 IMPLANT

## 2022-05-22 NOTE — Anesthesia Procedure Notes (Signed)
Procedure Name: MAC Date/Time: 05/22/2022 1:25 PM  Performed by: Jenne Campus, CRNAPre-anesthesia Checklist: Patient identified, Emergency Drugs available, Suction available and Patient being monitored Placement Confirmation: positive ETCO2

## 2022-05-22 NOTE — Interval H&P Note (Signed)
History and Physical Interval Note:  05/22/2022 12:57 PM  Veronica Greene  has presented today for surgery, with the diagnosis of OA RIGHT KNEE.  The various methods of treatment have been discussed with the patient and family. After consideration of risks, benefits and other options for treatment, the patient has consented to  Procedure(s): TOTAL KNEE ARTHROPLASTY (Right) as a surgical intervention.  The patient's history has been reviewed, patient examined, no change in status, stable for surgery.  I have reviewed the patient's chart and labs.  Questions were answered to the patient's satisfaction.     [name]

## 2022-05-22 NOTE — Progress Notes (Signed)
PT Cancellation Note  Patient Details Name: Veronica Greene MRN: [id number] DOB: 12-19-1945   Cancelled Treatment:    Reason Eval/Treat Not Completed: (P) Patient not medically ready. Checked in with RN and pt remains incontinent and is still experiencing numbness; requests hold PT at this time. We will follow acutely and check back as schedule and pt status allows which may be another day.    Coolidge Breeze, PT, DPT [facility] Office: [phone] Weekend pager: [phone] Coolidge Breeze 05/22/2022, 6:28 PM

## 2022-05-22 NOTE — Anesthesia Procedure Notes (Signed)
Spinal  Patient location during procedure: OR Start time: 05/22/2022 1:27 PM End time: 05/22/2022 1:37 PM Reason for block: surgical anesthesia Staffing Performed: resident/CRNA  Resident/CRNA: Jenne Campus, CRNA Performed by: Jenne Campus, CRNA Authorized by: Oleta Mouse, MD   Preanesthetic Checklist Completed: patient identified, IV checked, site marked, risks and benefits discussed, surgical consent, monitors and equipment checked, pre-op evaluation and timeout performed Spinal Block Patient position: sitting Prep: DuraPrep Patient monitoring: heart rate, cardiac monitor, continuous pulse ox and blood pressure Approach: midline Location: L3-4 Injection technique: single-shot Needle Needle type: Pencan  Needle gauge: 24 G Needle length: 10 cm Assessment Events: CSF return

## 2022-05-22 NOTE — Anesthesia Procedure Notes (Signed)
Anesthesia Regional Block: Adductor canal block   Pre-Anesthetic Checklist: , timeout performed,  Correct Patient, Correct Site, Correct Laterality,  Correct Procedure, Correct Position, site marked,  Risks and benefits discussed,  Surgical consent,  Pre-op evaluation,  At surgeon's request and post-op pain management  Laterality: Right and Lower  Prep: chloraprep       Needles:  Injection technique: Single-shot      Needle Length: 9cm  Needle Gauge: 22     Additional Needles: Arrow StimuQuik ECHO Echogenic Stimulating PNB Needle  Procedures:,,,, ultrasound used (permanent image in chart),,    Narrative:  Start time: 05/22/2022 12:59 PM End time: 05/22/2022 1:04 PM Injection made incrementally with aspirations every 5 mL.  Performed by: Personally  Anesthesiologist: Oleta Mouse, MD

## 2022-05-22 NOTE — Transfer of Care (Signed)
Immediate Anesthesia Transfer of Care Note  Patient: Veronica Greene  Procedure(s) Performed: TOTAL KNEE ARTHROPLASTY (Right: Knee)  Patient Location: PACU  Anesthesia Type:MAC and Spinal  Level of Consciousness: awake, alert , oriented, and patient cooperative  Airway & Oxygen Therapy: Patient Spontanous Breathing  Post-op Assessment: Report given to RN and Post -op Vital signs reviewed and stable  Post vital signs: Reviewed  Last Vitals:  Vitals Value Taken Time  BP 129/45 05/22/22 1602  Temp    Pulse 63 05/22/22 1606  Resp 17 05/22/22 1606  SpO2 95 % 05/22/22 1606  Vitals shown include unvalidated device data.  Last Pain:  Vitals:   05/22/22 1305  TempSrc:   PainSc: 0-No pain         Complications: No notable events documented.

## 2022-05-22 NOTE — Interval H&P Note (Signed)
History and Physical Interval Note:  05/22/2022 10:17 AM  Veronica Greene  has presented today for surgery, with the diagnosis of OA RIGHT KNEE.  The various methods of treatment have been discussed with the patient and family. After consideration of risks, benefits and other options for treatment, the patient has consented to  Procedure(s): TOTAL KNEE ARTHROPLASTY (Right) as a surgical intervention.  The patient's history has been reviewed, patient examined, no change in status, stable for surgery.  I have reviewed the patient's chart and labs.  Questions were answered to the patient's satisfaction.     [name]

## 2022-05-22 NOTE — Progress Notes (Signed)
Orthopedic Tech Progress Note Patient Details:  Veronica Greene [birth year] [id number]  Patient ID: Veronica Greene, female   DOB: 12-30-1945, 76 y.o.   MRN: [id number]  Veronica Greene 05/22/2022, 4:12 PM Bone foam applied in pacu

## 2022-05-22 NOTE — Interval H&P Note (Signed)
History and Physical Interval Note:  05/22/2022 11:47 AM  Veronica Greene  has presented today for surgery, with the diagnosis of OA RIGHT KNEE.  The various methods of treatment have been discussed with the patient and family. After consideration of risks, benefits and other options for treatment, the patient has consented to  Procedure(s): TOTAL KNEE ARTHROPLASTY (Right) as a surgical intervention.  The patient's history has been reviewed, patient examined, no change in status, stable for surgery.  I have reviewed the patient's chart and labs.  Questions were answered to the patient's satisfaction.     [name]

## 2022-05-22 NOTE — Discharge Instructions (Signed)

## 2022-05-23 ENCOUNTER — Encounter (HOSPITAL_COMMUNITY): Payer: Self-pay | Admitting: Orthopedic Surgery

## 2022-05-23 DIAGNOSIS — Z96651 Presence of right artificial knee joint: Secondary | ICD-10-CM | POA: Diagnosis not present

## 2022-05-23 DIAGNOSIS — M1711 Unilateral primary osteoarthritis, right knee: Secondary | ICD-10-CM | POA: Diagnosis not present

## 2022-05-23 MED ORDER — ASPIRIN 81 MG PO TBEC: 81.0000 mg | DELAYED_RELEASE_TABLET | Freq: Two times a day (BID) | ORAL | 0 refills | Status: AC

## 2022-05-23 MED ORDER — ONDANSETRON 4 MG PO TBDP: 4.0000 mg | ORAL_TABLET | Freq: Two times a day (BID) | ORAL | 0 refills | Status: DC | PRN

## 2022-05-23 MED ORDER — OXYCODONE HCL 5 MG PO TABS: 5.0000 mg | ORAL_TABLET | Freq: Four times a day (QID) | ORAL | 0 refills | Status: DC | PRN

## 2022-05-23 MED ORDER — ROCURONIUM BROMIDE 10 MG/ML (PF) SYRINGE
PREFILLED_SYRINGE | INTRAVENOUS | Status: AC
  Filled 2022-05-23: qty 50

## 2022-05-23 MED ORDER — METHOCARBAMOL 750 MG PO TABS: 750.0000 mg | ORAL_TABLET | Freq: Three times a day (TID) | ORAL | 0 refills | Status: DC | PRN

## 2022-05-23 MED ORDER — LIDOCAINE HCL (PF) 2 % IJ SOLN
INTRAMUSCULAR | Status: AC
  Filled 2022-05-23: qty 30

## 2022-05-23 NOTE — Progress Notes (Signed)
    Subjective: Patient reports pain as mild to moderate.  Tolerating diet.  Urinating.   No CP, SOB.  Has not mobilized OOB with PT yet. Eager to move.   Objective:   VITALS:   Vitals:   05/22/22 2245 05/23/22 0132 05/23/22 0554 05/23/22 0920  BP: (!) 128/56 (!) 132/53 (!) 124/52 129/63  Pulse: 75 71 62 67  Resp: '18 16 18 16  '$ Temp: 98.4 F (36.9 C) 98.1 F (36.7 C) 98.2 F (36.8 C) 98 F (36.7 C)  TempSrc: Oral Oral Oral Oral  SpO2: 93% 90% 93% 98%  Weight:      Height:          Latest Ref Rng & Units 05/16/2022    9:53 AM 11/23/2021    8:53 AM 11/25/2020    8:57 AM  CBC  WBC 4.0 - 10.5 K/uL 4.4  4.0  4.9   Hemoglobin 12.0 - 15.0 g/dL 11.8  12.5  12.1   Hematocrit 36.0 - 46.0 % 35.0  36.0  34.5   Platelets 150 - 400 K/uL 221  197.0  195.0       Latest Ref Rng & Units 05/16/2022    9:53 AM 11/23/2021    8:53 AM 11/25/2020    8:57 AM  BMP  Glucose 70 - 99 mg/dL 91  99  102   BUN 8 - 23 mg/dL '13  10  18   '$ Creatinine 0.44 - 1.00 mg/dL 0.54  0.64  0.67   Sodium 135 - 145 mmol/L 143  142  141   Potassium 3.5 - 5.1 mmol/L 4.0  4.0  4.2   Chloride 98 - 111 mmol/L 108  104  105   CO2 22 - 32 mmol/L '26  30  28   '$ Calcium 8.9 - 10.3 mg/dL 9.8  9.6  9.2    Intake/Output      11/14 0701 11/15 0700 11/15 0701 11/16 0700   P.O. 270 240   I.V. (mL/kg) 1400 (17.2)    IV Piggyback 527.9 0   Total Intake(mL/kg) 2197.9 (27) 240 (3)   Urine (mL/kg/hr) 1025    Blood 50    Total Output 1075    Net +1122.9 +240           Physical Exam: General: NAD.  Sitting up in bed, calm, comfortable Resp: No increased wob Cardio: regular rate and rhythm ABD soft Neurologically intact MSK Neurovascularly intact Sensation intact distally Intact pulses distally Dorsiflexion/Plantar flexion intact Incision: dressing C/D/I   Assessment: 1 Day Post-Op  S/P Procedure(s) (LRB): TOTAL KNEE ARTHROPLASTY (Right) by Dr. Ernesta Amble. Murphy on 05/22/22  Principal Problem:   S/P total knee  arthroplasty, right   Plan:  Advance diet Up with therapy Incentive Spirometry Elevate and Apply ice  Weightbearing: WBAT RLE Insicional and dressing care: Dressings left intact until follow-up and Reinforce dressings as needed Orthopedic device(s):  CPM Showering: Keep dressing dry VTE prophylaxis: Aspirin '81mg'$  BID  x 30 days , SCDs, ambulation Pain control: continue current regimen Follow - up plan: 2 weeks Contact information:  Edmonia Lynch MD, Aggie Moats PA-C  Dispo: Home hopefully later today if passes PT evaluation,     Alisa Graff Office [phone] 05/23/2022, 10:43 AM

## 2022-05-23 NOTE — Progress Notes (Signed)
Provided discharge education/instructions, all questions and concerns addressed. Pt not in acute distress, discharged home with belongings accompanied by husband. 

## 2022-05-23 NOTE — Discharge Summary (Signed)
Physician Discharge Summary  Patient ID: Veronica Greene MRN: [id number] DOB/AGE: June 14, 1946 76 y.o.  Admit date: 05/22/2022 Discharge date: 05/23/2022  Admission Diagnoses:  Discharge Diagnoses:  Principal Problem:   S/P total knee arthroplasty, right   Discharged Condition: fair  Hospital Course: Patient underwent a right TKA by Dr. Percell Miller on 21/19/41 without complications. She spent the night in observation for pain control and mobilization. She is now ready to be discharged home.  Consults: None  Significant Diagnostic Studies: n/a  Treatments: IV hydration, antibiotics: Ancef, analgesia: acetaminophen, Dilaudid, and Oxycodone, anticoagulation: ASA, therapies: PT, OT, and SW, and surgery: right TKA  Discharge Exam: Blood pressure 129/63, pulse 67, temperature 98 F (36.7 C), temperature source Oral, resp. rate 16, height '5\' 4"'$  (1.626 m), weight 81.3 kg, SpO2 98 %. General appearance: alert, cooperative, and no distress Head: Normocephalic, without obvious abnormality, atraumatic Resp: clear to auscultation bilaterally Cardio: regular rate and rhythm, S1, S2 normal, no murmur, click, rub or gallop Extremities: extremities normal, atraumatic, no cyanosis or edema Pulses:  L brachial 2+ R brachial 2+  L radial 2+ R radial 2+  L inguinal 2+ R inguinal 2+  L popliteal 2+ R popliteal 2+  L posterior tibial 2+ R posterior tibial 2+  L dorsalis pedis 2+ R dorsalis pedis 2+   Neurologic: Grossly normal Incision/Wound: c/d/i  Disposition: Discharge disposition: 01-Home or Self Care       Discharge Instructions     CPM   Complete by: As directed    Continuous passive motion machine (CPM):      Use the CPM from 0 to 90 degrees for 6 hours per day.      You may break it up into 2 or 3 sessions per day.      Use CPM for 3 weeks or until you are told to stop.   Call MD / Call 911   Complete by: As directed    If you experience chest pain or shortness of breath,  CALL 911 and be transported to the hospital emergency room.  If you develope a fever above 101 F, pus (white drainage) or increased drainage or redness at the wound, or calf pain, call your surgeon's office.   Diet - low sodium heart healthy   Complete by: As directed    Discharge instructions   Complete by: As directed    You may bear weight as tolerated. Keep your dressing on and dry until follow up. Take medicine to prevent blood clots as directed. Take pain medicine as needed with the goal of transitioning to over the counter medicines.    INSTRUCTIONS AFTER JOINT REPLACEMENT   Remove items at home which could result in a fall. This includes throw rugs or furniture in walking pathways ICE to the affected joint every three hours while awake for 30 minutes at a time, for at least the first 3-5 days, and then as needed for pain and swelling.  Continue to use ice for pain and swelling. You may notice swelling that will progress down to the foot and ankle.  This is normal after surgery.  Elevate your leg when you are not up walking on it.   Continue to use the breathing machine you got in the hospital (incentive spirometer) which will help keep your temperature down.  It is common for your temperature to cycle up and down following surgery, especially at night when you are not up moving around and exerting yourself.  The breathing machine keeps  your lungs expanded and your temperature down.   DIET:  As you were doing prior to hospitalization, we recommend a well-balanced diet.  DRESSING / WOUND CARE / SHOWERING  You may shower 3 days after surgery, but keep the wounds dry during showering.  You may use an occlusive plastic wrap (Press'n Seal for example) with blue painter's tape at edges, NO SOAKING/SUBMERGING IN THE BATHTUB.  If the bandage gets wet, call the office.   ACTIVITY  Increase activity slowly as tolerated, but follow the weight bearing instructions below.   No driving for 6  weeks or until further direction given by your physician.  You cannot drive while taking narcotics.  No lifting or carrying greater than 10 lbs. until further directed by your surgeon. Avoid periods of inactivity such as sitting longer than an hour when not asleep. This helps prevent blood clots.  You may return to work once you are authorized by your doctor.    WEIGHT BEARING   Weight bearing as tolerated with assist device (walker, cane, etc) as directed, use it as long as suggested by your surgeon or therapist, typically at least 4-6 weeks.   EXERCISES  Results after joint replacement surgery are often greatly improved when you follow the exercise, range of motion and muscle strengthening exercises prescribed by your doctor. Safety measures are also important to protect the joint from further injury. Any time any of these exercises cause you to have increased pain or swelling, decrease what you are doing until you are comfortable again and then slowly increase them. If you have problems or questions, call your caregiver or physical therapist for advice.   Rehabilitation is important following a joint replacement. After just a few days of immobilization, the muscles of the leg can become weakened and shrink (atrophy).  These exercises are designed to build up the tone and strength of the thigh and leg muscles and to improve motion. Often times heat used for twenty to thirty minutes before working out will loosen up your tissues and help with improving the range of motion but do not use heat for the first two weeks following surgery (sometimes heat can increase post-operative swelling).   These exercises can be done on a training (exercise) mat, on the floor, on a table or on a bed. Use whatever works the best and is most comfortable for you.    Use music or television while you are exercising so that the exercises are a pleasant break in your day. This will make your life better with the exercises  acting as a break in your routine that you can look forward to.   Perform all exercises about fifteen times, three times per day or as directed.  You should exercise both the operative leg and the other leg as well.  Exercises include:   Quad Sets - Tighten up the muscle on the front of the thigh (Quad) and hold for 5-10 seconds.   Straight Leg Raises - With your knee straight (if you were given a brace, keep it on), lift the leg to 60 degrees, hold for 3 seconds, and slowly lower the leg.  Perform this exercise against resistance later as your leg gets stronger.  Leg Slides: Lying on your back, slowly slide your foot toward your buttocks, bending your knee up off the floor (only go as far as is comfortable). Then slowly slide your foot back down until your leg is flat on the floor again.  Angel Wings: Lying on  your back spread your legs to the side as far apart as you can without causing discomfort.  Hamstring Strength:  Lying on your back, push your heel against the floor with your leg straight by tightening up the muscles of your buttocks.  Repeat, but this time bend your knee to a comfortable angle, and push your heel against the floor.  You may put a pillow under the heel to make it more comfortable if necessary.   A rehabilitation program following joint replacement surgery can speed recovery and prevent re-injury in the future due to weakened muscles. Contact your doctor or a physical therapist for more information on knee rehabilitation.    CONSTIPATION  Constipation is defined medically as fewer than three stools per week and severe constipation as less than one stool per week.  Even if you have a regular bowel pattern at home, your normal regimen is likely to be disrupted due to multiple reasons following surgery.  Combination of anesthesia, postoperative narcotics, change in appetite and fluid intake all can affect your bowels.   YOU MUST use at least one of the following options; they  are listed in order of increasing strength to get the job done.  They are all available over the counter, and you may need to use some, POSSIBLY even all of these options:    Drink plenty of fluids (prune juice may be helpful) and high fiber foods Colace 100 mg by mouth twice a day  Senokot for constipation as directed and as needed Dulcolax (bisacodyl), take with full glass of water  Miralax (polyethylene glycol) once or twice a day as needed.  If you have tried all these things and are unable to have a bowel movement in the first 3-4 days after surgery call either your surgeon or your primary doctor.    If you experience loose stools or diarrhea, hold the medications until you stool forms back up.  If your symptoms do not get better within 1 week or if they get worse, check with your doctor.  If you experience "the worst abdominal pain ever" or develop nausea or vomiting, please contact the office immediately for further recommendations for treatment.   ITCHING:  If you experience itching with your medications, try taking only a single pain pill, or even half a pain pill at a time.  You can also use Benadryl over the counter for itching or also to help with sleep.   TED HOSE STOCKINGS:  Use stockings on both legs until for at least 2 weeks or as directed by physician office. They may be removed at night for sleeping.  MEDICATIONS:  See your medication summary on the "After Visit Summary" that nursing will review with you.  You may have some home medications which will be placed on hold until you complete the course of blood thinner medication.  It is important for you to complete the blood thinner medication as prescribed.  Take medicines as prescribed.   You have several different medicines that work in different ways. - Tylenol is for mild to moderate pain. Try to take this medicine before turning to your narcotic medicines.  - Meloxicam is to reduce pain / inflammation - Robaxin is for  muscle spasms. This medicine can make you drowsy. - Oxycodone is a narcotic pain medicine.  Take this for severe pain. This medicine can be dehydrating / constipating. - Zofran is for nausea and vomiting. - Aspirin is to prevent blood clots after surgery. YOU MUST TAKE  THIS MEDICINE!  PRECAUTIONS:  If you experience chest pain or shortness of breath - call 911 immediately for transfer to the hospital emergency department.   If you develop a fever greater that 101 F, purulent drainage from wound, increased redness or drainage from wound, foul odor from the wound/dressing, or calf pain - CONTACT YOUR SURGEON.                                                   FOLLOW-UP APPOINTMENTS:  If you do not already have a post-op appointment, please call the office [phone] for an appointment to be seen by Dr. Percell Miller in 2 weeks.   OTHER INSTRUCTIONS:   MAKE SURE YOU:  Understand these instructions.  Get help right away if you are not doing well or get worse.    Thank you for letting us be a part of your medical care team.  It is a privilege we respect greatly.  We hope these instructions will help you stay on track for a fast and full recovery!   Do not put a pillow under the knee. Place it under the heel.   Complete by: As directed    Driving restrictions   Complete by: As directed    No driving for 2-4 weeks   Post-operative opioid taper instructions:   Complete by: As directed    POST-OPERATIVE OPIOID TAPER INSTRUCTIONS: It is important to wean off of your opioid medication as soon as possible. If you do not need pain medication after your surgery it is ok to stop day one. Opioids include: Codeine, Hydrocodone(Norco, Vicodin), Oxycodone(Percocet, oxycontin) and hydromorphone amongst others.  Long term and even short term use of opiods can cause: Increased pain response Dependence Constipation Depression Respiratory depression And more.  Withdrawal symptoms can include Flu like  symptoms Nausea, vomiting And more Techniques to manage these symptoms Hydrate well Eat regular healthy meals Stay active Use relaxation techniques(deep breathing, meditating, yoga) Do Not substitute Alcohol to help with tapering If you have been on opioids for less than two weeks and do not have pain than it is ok to stop all together.  Plan to wean off of opioids This plan should start within one week post op of your joint replacement. Maintain the same interval or time between taking each dose and first decrease the dose.  Cut the total daily intake of opioids by one tablet each day Next start to increase the time between doses. The last dose that should be eliminated is the evening dose.      TED hose   Complete by: As directed    Use stockings (TED hose) for 2 weeks on leg(s).  You may remove them at night for sleeping.   Weight bearing as tolerated   Complete by: As directed       Allergies as of 05/23/2022       Reactions   Clarithromycin Swelling, Other (See Comments)   Throat Swells   Penicillins Hives, Other (See Comments)   WELTS Has patient had a PCN reaction causing immediate rash, facial/tongue/throat swelling, SOB or lightheadedness with hypotension: No Has patient had a PCN reaction causing severe rash involving mucus membranes or skin necrosis: Unknown Has patient had a PCN reaction that required hospitalization:No Has patient had a PCN reaction occurring within the last 10 years: No If all  of the above answers are "NO", then may proceed with Cephalosporin use.   Other    Band-Aid, leaves mark on skin   Codeine Nausea And Vomiting        Medication List     TAKE these medications    acetaminophen 500 MG tablet Commonly known as: TYLENOL Take 1,000 mg by mouth every 6 (six) hours as needed for moderate pain.   acetic acid-hydrocortisone OTIC solution Commonly known as: VOSOL-HC Place 2 drops into both ears 2 (two) times daily as needed  (itching).   amLODipine 10 MG tablet Commonly known as: NORVASC Take 1 tablet (10 mg total) by mouth daily.   aspirin EC 81 MG tablet Take 1 tablet (81 mg total) by mouth 2 (two) times daily. To prevent blood clots for 30 days after surgery. What changed:  when to take this additional instructions   CALCIUM/VITAMIN D3/ADULT GUMMY PO Take 1 tablet by mouth 2 (two) times daily.   cetirizine 10 MG tablet Commonly known as: ZYRTEC Take 10 mg by mouth daily.   cholecalciferol 1000 units tablet Commonly known as: VITAMIN D Take 1,000 Units by mouth daily with breakfast.   fluticasone 50 MCG/ACT nasal spray Commonly known as: FLONASE Place 2 sprays into both nostrils at bedtime.   furosemide 20 MG tablet Commonly known as: LASIX MAY TAKE 1 TABLET (20 MG TOTAL) UP TO THREE TIMES A WEEK. DO NOT TAKE HCTZ THAT DAY. What changed:  how much to take how to take this when to take this additional instructions   hydrochlorothiazide 25 MG tablet Commonly known as: HYDRODIURIL Take 1 tablet (25 mg total) by mouth daily with lunch. What changed:  when to take this additional instructions   lubiprostone 24 MCG capsule Commonly known as: Amitiza Take 1 capsule (24 mcg total) by mouth 2 (two) times daily. LUNCH & SUPPER   meclizine 25 MG tablet Commonly known as: ANTIVERT Take 0.5-1 tablets (12.5-25 mg total) by mouth 3 (three) times daily as needed for dizziness. Caution of sedation   meloxicam 7.5 MG tablet Commonly known as: MOBIC TAKE 1 TABLET BY MOUTH AT BEDTIME.   methocarbamol 750 MG tablet Commonly known as: Robaxin-750 Take 1 tablet (750 mg total) by mouth every 8 (eight) hours as needed for muscle spasms.   metoprolol succinate 25 MG 24 hr tablet Commonly known as: TOPROL-XL Take 1 tablet (25 mg total) by mouth daily. What changed: when to take this   mupirocin ointment 2 % Commonly known as: BACTROBAN Apply 1 Application topically 2 (two) times daily. To wound    NexIUM 40 MG capsule Generic drug: esomeprazole Take 40 mg by mouth in the morning.   ondansetron 4 MG disintegrating tablet Commonly known as: ZOFRAN-ODT Take 1 tablet (4 mg total) by mouth 2 (two) times daily as needed for nausea or vomiting.   oxyCODONE 5 MG immediate release tablet Commonly known as: Roxicodone Take 1 tablet (5 mg total) by mouth every 6 (six) hours as needed for severe pain.   Polyethyl Glycol-Propyl Glycol 0.4-0.3 % Soln Place 1-2 drops into both eyes at bedtime.   potassium chloride 10 MEQ tablet Commonly known as: KLOR-CON Take 2 tablets (20 mEq total) by mouth 2 (two) times daily.   sodium chloride 0.65 % nasal spray Commonly known as: OCEAN Place 1 spray into the nose daily.   triamcinolone cream 0.1 % Commonly known as: KENALOG Apply topically 2 (two) times daily. To affected area/ rash What changed:  how much  to take when to take this reasons to take this additional instructions   TURMERIC COMPLEX/BLACK PEPPER PO Take 15 mLs by mouth daily with breakfast.   venlafaxine XR 75 MG 24 hr capsule Commonly known as: Effexor XR Take 1 capsule (75 mg total) by mouth daily after breakfast.   vitamin C 1000 MG tablet Take 1,000 mg by mouth daily with breakfast.               Discharge Care Instructions  (From admission, onward)           Start     Ordered   05/23/22 0000  Weight bearing as tolerated        05/23/22 1549            Follow-up Information     [name], MD. Go on 06/06/2022.   Specialty: Orthopedic Surgery Why: your appointment is at 4:00 Contact information: [address] [place] [address] [phone]         [employer], [place]. Go on 05/24/2022.   Why: Your outpatient physical therapy appointment is scheduled for Contact information: [facility] [place] [address] [phone]                  Signed: Alisa Graff 05/23/2022, 3:49 PM

## 2022-05-23 NOTE — TOC Transition Note (Signed)
Transition of Care [facility]) - CM/SW Discharge Note   Patient Details  Name: Veronica Greene MRN: [id number] Date of Birth: [birth year]  [facility] CM/SW Contact:  Lennart Pall, LCSW Phone Number: 05/23/2022, 11:14 AM   Clinical Narrative:    Confirming pt has needed DME at home.  OPPT already arranged with [facility].  No TOC needs.   Final next level of care: OP Rehab Barriers to Discharge: No Barriers Identified   Patient Goals and CMS Choice Patient states their goals for this hospitalization and ongoing recovery are:: return home      Discharge Placement                       Discharge Plan and Services                DME Arranged: [facility] DME Agency: [facility]                  Social Determinants of Health (SDOH) Interventions Food Insecurity Interventions: Intervention Not Indicated Housing Interventions: Intervention Not Indicated Transportation Interventions: Intervention Not Indicated Utilities Interventions: Intervention Not Indicated   Readmission Risk Interventions     No data to display

## 2022-05-23 NOTE — Plan of Care (Signed)

## 2022-05-23 NOTE — Anesthesia Postprocedure Evaluation (Signed)
Anesthesia Post Note  Patient: [name]  Procedure(s) Performed: TOTAL KNEE ARTHROPLASTY (Right: Knee)     Patient location during evaluation: PACU Anesthesia Type: Regional, MAC and Spinal Level of consciousness: awake and alert Pain management: pain level controlled Vital Signs Assessment: post-procedure vital signs reviewed and stable Respiratory status: spontaneous breathing, nonlabored ventilation and respiratory function stable Cardiovascular status: stable and blood pressure returned to baseline Postop Assessment: no apparent nausea or vomiting Anesthetic complications: no   No notable events documented.  Last Vitals:  Vitals:   05/23/22 0554 05/23/22 0920  BP: (!) 124/52 129/63  Pulse: 62 67  Resp: 18 16  Temp: 36.8 C 36.7 C  SpO2: 93% 98%    Last Pain:  Vitals:   05/23/22 1600  TempSrc:   PainSc: 3                  Lenka Zhao

## 2022-05-23 NOTE — Evaluation (Signed)
Physical Therapy One Time Evaluation Patient Details Name: Veronica Greene MRN: [id number] DOB: 1945-08-01 Today's Date: 05/23/2022  History of Present Illness  Pt is a 76yo female presenting s/p R-TKA on 05/22/22. PMH: anxiety & depression, hx of endometrial cancer s/p radiation, GERD, HTN, gout, hearing loss (bilateral hearing aides), HLD, HTN, IBS, osteopenia, s/p TAVR, urinary incontinence, fibromyalgia, hx of poor balance with falls (riser in R shoe post-polio) per chart review.  Clinical Impression  Patient evaluated by Physical Therapy with no further acute PT needs identified. All education has been completed and the patient has no further questions.  Pt assisted with donning shoes (LLD and has built up Right shoe due to hx of polio) and using BSC.  Pt then ambulated in hallway and practiced safe stair technique.  Pt provided with stair handout.  Verbally discussed exercises and pt provided with HEP handout however pt very eager to d/c so did not perform.  Pt plans to be very mobile at home and use zero bone foam and CPM as well.  Pt had no further questions. See below for any follow-up Physical Therapy or equipment needs. PT is signing off. Thank you for this referral.        Recommendations for follow up therapy are one component of a multi-disciplinary discharge planning process, led by the attending physician.  Recommendations may be updated based on patient status, additional functional criteria and insurance authorization.  Follow Up Recommendations Follow physician's recommendations for discharge plan and follow up therapies      Assistance Recommended at Discharge PRN  Patient can return home with the following  A little help with bathing/dressing/bathroom;A little help with walking and/or transfers;Help with stairs or ramp for entrance    Equipment Recommendations None recommended by PT  Recommendations for Other Services       Functional Status Assessment Patient has  had a recent decline in their functional status and demonstrates the ability to make significant improvements in function in a reasonable and predictable amount of time.     Precautions / Restrictions Precautions Precautions: Fall;Knee Restrictions Weight Bearing Restrictions: Yes RLE Weight Bearing: Weight bearing as tolerated      Mobility  Bed Mobility Overal bed mobility: Modified Independent                  Transfers Overall transfer level: Needs assistance Equipment used: Rolling walker (2 wheels) Transfers: Sit to/from Stand, Bed to chair/wheelchair/BSC Sit to Stand: Min guard Stand pivot transfers: Min guard         General transfer comment: pt assisted with donning shoes at EOB, increased time for donning her built up R shoe due to small shoe but also R foot more swollen then L    Ambulation/Gait Ambulation/Gait assistance: Min guard Gait Distance (Feet): 100 Feet Assistive device: Rolling walker (2 wheels) Gait Pattern/deviations: Step-to pattern, Decreased stance time - right, Trunk flexed       General Gait Details: verbal cues for posture, sequence, RW positioning  Stairs Stairs: Yes Stairs assistance: Min guard Stair Management: Step to pattern, Backwards Number of Stairs: 2 General stair comments: verbal cues for sequence, RW positioning, safety; performed twice and pt reports understanding; provided handout  Wheelchair Mobility    Modified Rankin (Stroke Patients Only)       Balance  Pertinent Vitals/Pain Pain Assessment Pain Assessment: 0-10 Pain Score: 6  Pain Location: right knee Pain Descriptors / Indicators: Sore, Aching Pain Intervention(s): Repositioned, Monitored during session    Veronica Greene expects to be discharged to:: Private residence Living Arrangements: Spouse/significant other   Type of Home: House Home Access: Stairs to  enter Entrance Stairs-Rails: None Entrance Stairs-Number of Steps: 2   Home Layout: One level Home Equipment: Conservation officer, nature (2 wheels);Rollator (4 wheels)      Prior Function Prior Level of Function : Independent/Modified Independent                     Hand Dominance        Extremity/Trunk Assessment        Lower Extremity Assessment Lower Extremity Assessment: RLE deficits/detail RLE Deficits / Details: able to perform SLR, observed 90* knee flexion with AROM during sitting EOBLLD due to hx of polio, has built up right shoe which was donned for session       Communication   Communication: HOH  Cognition Arousal/Alertness: Awake/alert Behavior During Therapy: WFL for tasks assessed/performed Overall Cognitive Status: Within Functional Limits for tasks assessed                                          General Comments      Exercises     Assessment/Plan    PT Assessment Patient does not need any further PT services  PT Problem List         PT Treatment Interventions      PT Goals (Current goals can be found in the Care Plan section)  Acute Rehab PT Goals PT Goal Formulation: All assessment and education complete, DC therapy    Frequency       Co-evaluation               AM-PAC PT "6 Clicks" Mobility  Outcome Measure Help needed turning from your back to your side while in a flat bed without using bedrails?: None Help needed moving from lying on your back to sitting on the side of a flat bed without using bedrails?: A Little Help needed moving to and from a bed to a chair (including a wheelchair)?: A Little Help needed standing up from a chair using your arms (e.g., wheelchair or bedside chair)?: A Little Help needed to walk in hospital room?: A Little Help needed climbing 3-5 steps with a railing? : A Little 6 Click Score: 19    End of Session Equipment Utilized During Treatment: Gait belt Activity Tolerance:  Patient tolerated treatment well Patient left: in chair;with call bell/phone within reach Nurse Communication: Mobility status PT Visit Diagnosis: Difficulty in walking, not elsewhere classified (R26.2)    Time: 9629-5284 PT Time Calculation (min) (ACUTE ONLY): 32 min   Charges:   PT Evaluation $PT Eval Low Complexity: 1 Low PT Treatments $Gait Training: 8-22 mins      Jannette Spanner PT, DPT Physical Therapist Acute Rehabilitation Services Preferred contact method: Secure Chat Weekend Pager Only: [phone] Office: [phone]   Myrtis Hopping [place] 05/23/2022, 12:25 PM

## 2022-05-24 DIAGNOSIS — R262 Difficulty in walking, not elsewhere classified: Secondary | ICD-10-CM | POA: Diagnosis not present

## 2022-05-24 DIAGNOSIS — M6281 Muscle weakness (generalized): Secondary | ICD-10-CM | POA: Diagnosis not present

## 2022-05-24 DIAGNOSIS — M1711 Unilateral primary osteoarthritis, right knee: Secondary | ICD-10-CM | POA: Diagnosis not present

## 2022-05-24 DIAGNOSIS — M25661 Stiffness of right knee, not elsewhere classified: Secondary | ICD-10-CM | POA: Diagnosis not present

## 2022-05-24 NOTE — Op Note (Signed)
DATE OF SURGERY:  05/22/2022 TIME: 6:40 AM  PATIENT NAME:  Veronica Greene   AGE: 76 y.o.    PRE-OPERATIVE DIAGNOSIS:  OA RIGHT KNEE  POST-OPERATIVE DIAGNOSIS:  Same  PROCEDURE:  Procedure(s): TOTAL KNEE ARTHROPLASTY   SURGEON:  [name], MD   ASSISTANT:  Aggie Moats, PA-C, he was present and scrubbed throughout the case, critical for completion in a timely fashion, and for retraction, instrumentation, and closure.    OPERATIVE IMPLANTS: [employer] Triathlon CR. Press fit knee  Femur size 4, Tibia size 4, Patella size 29 3-peg oval button, with a 14 mm polyethylene insert.   PREOPERATIVE INDICATIONS:  Kiyo Heal is a 76 y.o. year old female with end stage bone on bone degenerative arthritis of the knee who failed conservative treatment, including injections, antiinflammatories, activity modification, and assistive devices, and had significant impairment of their activities of daily living, and elected for Total Knee Arthroplasty.   The risks, benefits, and alternatives were discussed at length including but not limited to the risks of infection, bleeding, nerve injury, stiffness, blood clots, the need for revision surgery, cardiopulmonary complications, among others, and they were willing to proceed.   OPERATIVE DESCRIPTION:  The patient was brought to the operative room and placed in a supine position.  General anesthesia was administered.  IV antibiotics were given.  The lower extremity was prepped and draped in the usual sterile fashion.  Time out was performed.  The leg was elevated and exsanguinated and the tourniquet was inflated.  Anterior approach was performed.  The patella was everted and osteophytes were removed.  The anterior horn of the medial and lateral meniscus was removed.   The distal femur was opened with the drill and the intramedullary distal femoral cutting jig was utilized, set at 5 degrees resecting 8 mm off the distal femur.  Care was  taken to protect the collateral ligaments.  The distal femoral sizing jig was applied, taking care to avoid notching.  Then the 4-in-1 cutting jig was applied and the anterior and posterior femur was cut, along with the chamfer cuts.  All posterior osteophytes were removed.  The flexion gap was then measured and was symmetric with the extension gap.  Then the extramedullary tibial cutting jig was utilized making the appropriate cut using the anterior tibial crest as a reference building in appropriate posterior slope.  Care was taken during the cut to protect the medial and collateral ligaments.  The proximal tibia was removed along with the posterior horns of the menisci.  The PCL was sacrificed.    The extensor gap was measured and was approximately 22m.    I completed the distal femoral preparation using the appropriate jig to prepare the box.  The patella was then measured, and cut with the saw.    The proximal tibia sized and prepared accordingly with the reamer and the punch, and then all components were trialed with the above sized poly insert.  The knee was found to have excellent balance and full motion.    The above named components were then impacted into place and Poly tibial piece and patella were inserted.  I was very happy with his stability and ROM  I performed a periarticular injection with marcaine and toradol  The knee was easily taken through a range of motion and the patella tracked well and the knee irrigated copiously and the parapatellar and subcutaneous tissue closed with vicryl, and monocryl with steri strips for the skin.  The  incision was dressed with sterile gauze and the tourniquet released and the patient was awakened and returned to the PACU in stable and satisfactory condition.  There were no complications.  Total tourniquet time was roughly 75 minutes.   POSTOPERATIVE PLAN: post op Abx, DVT px: SCD's, TED's, Early ambulation and chemical px

## 2022-06-04 DIAGNOSIS — M25661 Stiffness of right knee, not elsewhere classified: Secondary | ICD-10-CM | POA: Diagnosis not present

## 2022-06-04 DIAGNOSIS — M1711 Unilateral primary osteoarthritis, right knee: Secondary | ICD-10-CM | POA: Diagnosis not present

## 2022-06-04 DIAGNOSIS — M6281 Muscle weakness (generalized): Secondary | ICD-10-CM | POA: Diagnosis not present

## 2022-06-04 DIAGNOSIS — R262 Difficulty in walking, not elsewhere classified: Secondary | ICD-10-CM | POA: Diagnosis not present

## 2022-06-05 ENCOUNTER — Other Ambulatory Visit (HOSPITAL_COMMUNITY): Payer: PPO

## 2022-06-06 DIAGNOSIS — R262 Difficulty in walking, not elsewhere classified: Secondary | ICD-10-CM | POA: Diagnosis not present

## 2022-06-06 DIAGNOSIS — M6281 Muscle weakness (generalized): Secondary | ICD-10-CM | POA: Diagnosis not present

## 2022-06-06 DIAGNOSIS — M1711 Unilateral primary osteoarthritis, right knee: Secondary | ICD-10-CM | POA: Diagnosis not present

## 2022-06-06 DIAGNOSIS — M25661 Stiffness of right knee, not elsewhere classified: Secondary | ICD-10-CM | POA: Diagnosis not present

## 2022-06-12 DIAGNOSIS — M25661 Stiffness of right knee, not elsewhere classified: Secondary | ICD-10-CM | POA: Diagnosis not present

## 2022-06-12 DIAGNOSIS — R262 Difficulty in walking, not elsewhere classified: Secondary | ICD-10-CM | POA: Diagnosis not present

## 2022-06-12 DIAGNOSIS — M1711 Unilateral primary osteoarthritis, right knee: Secondary | ICD-10-CM | POA: Diagnosis not present

## 2022-06-12 DIAGNOSIS — M6281 Muscle weakness (generalized): Secondary | ICD-10-CM | POA: Diagnosis not present

## 2022-06-14 DIAGNOSIS — R262 Difficulty in walking, not elsewhere classified: Secondary | ICD-10-CM | POA: Diagnosis not present

## 2022-06-14 DIAGNOSIS — M6281 Muscle weakness (generalized): Secondary | ICD-10-CM | POA: Diagnosis not present

## 2022-06-14 DIAGNOSIS — M25661 Stiffness of right knee, not elsewhere classified: Secondary | ICD-10-CM | POA: Diagnosis not present

## 2022-06-14 DIAGNOSIS — M1711 Unilateral primary osteoarthritis, right knee: Secondary | ICD-10-CM | POA: Diagnosis not present

## 2022-06-19 DIAGNOSIS — M1711 Unilateral primary osteoarthritis, right knee: Secondary | ICD-10-CM | POA: Diagnosis not present

## 2022-06-19 DIAGNOSIS — M6281 Muscle weakness (generalized): Secondary | ICD-10-CM | POA: Diagnosis not present

## 2022-06-19 DIAGNOSIS — M25661 Stiffness of right knee, not elsewhere classified: Secondary | ICD-10-CM | POA: Diagnosis not present

## 2022-06-19 DIAGNOSIS — R262 Difficulty in walking, not elsewhere classified: Secondary | ICD-10-CM | POA: Diagnosis not present

## 2022-06-21 DIAGNOSIS — M6281 Muscle weakness (generalized): Secondary | ICD-10-CM | POA: Diagnosis not present

## 2022-06-21 DIAGNOSIS — M1711 Unilateral primary osteoarthritis, right knee: Secondary | ICD-10-CM | POA: Diagnosis not present

## 2022-06-21 DIAGNOSIS — R262 Difficulty in walking, not elsewhere classified: Secondary | ICD-10-CM | POA: Diagnosis not present

## 2022-06-21 DIAGNOSIS — M25661 Stiffness of right knee, not elsewhere classified: Secondary | ICD-10-CM | POA: Diagnosis not present

## 2022-06-27 ENCOUNTER — Other Ambulatory Visit (HOSPITAL_COMMUNITY): Payer: Self-pay | Admitting: Orthopedic Surgery

## 2022-06-27 ENCOUNTER — Ambulatory Visit (HOSPITAL_COMMUNITY)
Admission: RE | Admit: 2022-06-27 | Discharge: 2022-06-27 | Disposition: A | Discharge status: Home / Self Care | Payer: PPO | Source: Ambulatory Visit | Attending: Orthopedic Surgery | Admitting: Orthopedic Surgery

## 2022-06-27 DIAGNOSIS — M79604 Pain in right leg: Secondary | ICD-10-CM | POA: Insufficient documentation

## 2022-06-27 DIAGNOSIS — M1711 Unilateral primary osteoarthritis, right knee: Secondary | ICD-10-CM | POA: Diagnosis not present

## 2022-06-27 NOTE — Progress Notes (Signed)
Right lower extremity venous duplex has been completed. Preliminary results can be found in CV Proc through chart review.  Results were given to [facility] at Dr. Debroah [address] office.  06/27/22 1:43 PM Veronica Greene RVT

## 2022-06-28 ENCOUNTER — Ambulatory Visit (HOSPITAL_COMMUNITY): Payer: PPO | Attending: Cardiology

## 2022-06-28 ENCOUNTER — Encounter (HOSPITAL_COMMUNITY): Payer: PPO

## 2022-06-28 DIAGNOSIS — I350 Nonrheumatic aortic (valve) stenosis: Secondary | ICD-10-CM | POA: Insufficient documentation

## 2022-06-28 DIAGNOSIS — Z952 Presence of prosthetic heart valve: Secondary | ICD-10-CM | POA: Diagnosis not present

## 2022-06-28 LAB — ECHOCARDIOGRAM COMPLETE
AR max vel: 1.81 cm2
AV Area VTI: 2.06 cm2
AV Area mean vel: 1.87 cm2
AV Mean grad: 17 mmHg
AV Peak grad: 31.1 mmHg
Ao pk vel: 2.79 m/s
Area-P 1/2: 3.71 cm2
S' Lateral: 2 cm

## 2022-07-03 DIAGNOSIS — R262 Difficulty in walking, not elsewhere classified: Secondary | ICD-10-CM | POA: Diagnosis not present

## 2022-07-03 DIAGNOSIS — M6281 Muscle weakness (generalized): Secondary | ICD-10-CM | POA: Diagnosis not present

## 2022-07-03 DIAGNOSIS — M25661 Stiffness of right knee, not elsewhere classified: Secondary | ICD-10-CM | POA: Diagnosis not present

## 2022-07-03 DIAGNOSIS — M1711 Unilateral primary osteoarthritis, right knee: Secondary | ICD-10-CM | POA: Diagnosis not present

## 2022-07-04 ENCOUNTER — Ambulatory Visit
Admission: RE | Admit: 2022-07-04 | Discharge: 2022-07-04 | Disposition: A | Discharge status: Home / Self Care | Payer: PPO | Source: Ambulatory Visit | Attending: Family Medicine | Admitting: Family Medicine

## 2022-07-04 DIAGNOSIS — Z1231 Encounter for screening mammogram for malignant neoplasm of breast: Secondary | ICD-10-CM | POA: Diagnosis not present

## 2022-07-05 DIAGNOSIS — M6281 Muscle weakness (generalized): Secondary | ICD-10-CM | POA: Diagnosis not present

## 2022-07-05 DIAGNOSIS — R262 Difficulty in walking, not elsewhere classified: Secondary | ICD-10-CM | POA: Diagnosis not present

## 2022-07-05 DIAGNOSIS — M25661 Stiffness of right knee, not elsewhere classified: Secondary | ICD-10-CM | POA: Diagnosis not present

## 2022-07-05 DIAGNOSIS — M1711 Unilateral primary osteoarthritis, right knee: Secondary | ICD-10-CM | POA: Diagnosis not present

## 2022-07-12 DIAGNOSIS — M1711 Unilateral primary osteoarthritis, right knee: Secondary | ICD-10-CM | POA: Diagnosis not present

## 2022-07-12 DIAGNOSIS — M25661 Stiffness of right knee, not elsewhere classified: Secondary | ICD-10-CM | POA: Diagnosis not present

## 2022-07-12 DIAGNOSIS — M6281 Muscle weakness (generalized): Secondary | ICD-10-CM | POA: Diagnosis not present

## 2022-07-12 DIAGNOSIS — R262 Difficulty in walking, not elsewhere classified: Secondary | ICD-10-CM | POA: Diagnosis not present

## 2022-07-17 DIAGNOSIS — R262 Difficulty in walking, not elsewhere classified: Secondary | ICD-10-CM | POA: Diagnosis not present

## 2022-07-17 DIAGNOSIS — M25661 Stiffness of right knee, not elsewhere classified: Secondary | ICD-10-CM | POA: Diagnosis not present

## 2022-07-17 DIAGNOSIS — M1711 Unilateral primary osteoarthritis, right knee: Secondary | ICD-10-CM | POA: Diagnosis not present

## 2022-07-17 DIAGNOSIS — M6281 Muscle weakness (generalized): Secondary | ICD-10-CM | POA: Diagnosis not present

## 2022-07-18 ENCOUNTER — Telehealth: Payer: Self-pay | Admitting: Family Medicine

## 2022-07-18 NOTE — Telephone Encounter (Signed)
Pt called stating she's having some vertigo issues & is requesting a call back from the nurse regarding a new prescription being prescribed by [facility]? Call back # [phone]

## 2022-07-18 NOTE — Telephone Encounter (Signed)
Please triage

## 2022-07-18 NOTE — Telephone Encounter (Signed)
Aware, I will see her then Agree with ER precautions

## 2022-07-18 NOTE — Telephone Encounter (Signed)
I spoke with pt; pt has slight vertigo during the day and at night room spins. Pt said the meclizine helps some time during the day but does not help at night. Pt does not think her BP cuff is working.No H/A,CP or SOB. Pt recently had knee surgery and is not driving at this time. Pt was offered appts with providers at [facility] but pt only wants to see Dr Glori Bickers. Pt scheduled appt with Dr Glori Bickers on 07/20/22 at 11;30 with UC & ED precautions and pt voiced understanding and appreciative of appt. Pt said dizziness has been going on and off for 2 wks. If pt decides will see different provider before 07/20/22 pt will cb. Pt is aware to get up from laying or sitting position slowly and pt is aware of fall precautions. Sending note to Dr Glori Bickers and [employer].

## 2022-07-19 ENCOUNTER — Other Ambulatory Visit: Payer: Self-pay | Admitting: Cardiology

## 2022-07-19 DIAGNOSIS — M25661 Stiffness of right knee, not elsewhere classified: Secondary | ICD-10-CM | POA: Diagnosis not present

## 2022-07-19 DIAGNOSIS — M1711 Unilateral primary osteoarthritis, right knee: Secondary | ICD-10-CM | POA: Diagnosis not present

## 2022-07-19 DIAGNOSIS — M6281 Muscle weakness (generalized): Secondary | ICD-10-CM | POA: Diagnosis not present

## 2022-07-19 DIAGNOSIS — R262 Difficulty in walking, not elsewhere classified: Secondary | ICD-10-CM | POA: Diagnosis not present

## 2022-07-19 DIAGNOSIS — Z952 Presence of prosthetic heart valve: Secondary | ICD-10-CM

## 2022-07-19 DIAGNOSIS — R609 Edema, unspecified: Secondary | ICD-10-CM

## 2022-07-20 ENCOUNTER — Ambulatory Visit (INDEPENDENT_AMBULATORY_CARE_PROVIDER_SITE_OTHER): Payer: PPO | Admitting: Family Medicine

## 2022-07-20 ENCOUNTER — Encounter: Payer: Self-pay | Admitting: Family Medicine

## 2022-07-20 VITALS — BP 133/60 | HR 73 | Temp 97.7°F | Ht 64.0 in | Wt 173.0 lb

## 2022-07-20 DIAGNOSIS — H8113 Benign paroxysmal vertigo, bilateral: Secondary | ICD-10-CM | POA: Diagnosis not present

## 2022-07-20 DIAGNOSIS — I10 Essential (primary) hypertension: Secondary | ICD-10-CM

## 2022-07-20 MED ORDER — MECLIZINE HCL 25 MG PO TABS: 12.5000 mg | ORAL_TABLET | Freq: Three times a day (TID) | ORAL | 0 refills | Status: DC | PRN

## 2022-07-20 NOTE — Patient Instructions (Addendum)
Change position slowly -especially in bed/ getting out of bed   Stay hydrated   No change in medicines   Take the meclizine if needed   Update if not starting to improve in a week or if worsening  We can have you see ENT specialist   Continue the flonase

## 2022-07-20 NOTE — Progress Notes (Unsigned)
Subjective:    Patient ID: Veronica Greene, female    DOB: Jan 21, 1946, 77 y.o.   MRN: [id number]  HPI Pt presents for c/o dizziness   Wt Readings from Last 3 Encounters:  07/20/22 173 lb (78.5 kg)  05/22/22 179 lb 3.2 oz (81.3 kg)  05/16/22 179 lb 3.2 oz (81.3 kg)   29.70 kg/m  Vitals:   07/20/22 1124  BP: (!) 148/62  Pulse: 73  Temp: 97.7 F (36.5 C)  TempSrc: Temporal  SpO2: 97%  Weight: 173 lb (78.5 kg)  Height: _0  (1.626 m)   Had a knee replacement  Graduated from PT yesterday  Ended up with some leg swelling ([place] was nl)  Tx with prednisone and it is better   Called on 1/10 and reported slight vertigo during the day  Feels room spinning  Meclizine helps some but not as effective at night (whole bed spun when she was lying down)  Also when sitting on side of the bed    It is improved now / better for past 2 d Not dizzy right now   Ears feel ok overall  Had hearing aides cleaned last week  Has had some nasal congestion   Occ headache in R temple - no more than usual  No stroke symptoms    Noted was on high dose of prednisone recently   HTN bp is stable today  No cp or palpitations or headaches or edema  No side effects to medicines  BP Readings from Last 3 Encounters:  07/20/22 133/60  05/23/22 129/63  05/16/22 (!) 145/53    Amlodipine Toprol  Intermittent furosemide 3 d per week for swelling  Hctz 4 days per week for swelling  Lab Results  Component Value Date   CREATININE 0.54 05/16/2022   BUN 13 05/16/2022   NA 143 05/16/2022   K 4.0 05/16/2022   CL 108 05/16/2022   CO2 26 05/16/2022  GFR over 60   Had echo Her valve is leaking again?  She has card f/u in April  Her husband had a valve issues now - is about to have a procedure   Patient Active Problem List   Diagnosis Date Noted   S/P total knee arthroplasty, right 05/22/2022   Wound of left leg 04/23/2022   Rash and nonspecific skin eruption 04/23/2022   Cellulitis  of right leg 04/23/2022   Pre-op examination 03/30/2022   Toe pain 11/30/2021   Laryngopharyngeal reflux 11/20/2021   Tinnitus of both ears 08/18/2020   Itching of ear 08/18/2020   Multiple falls 03/30/2020   Lower extremity edema 02/21/2020   S/P TAVR (transcatheter aortic valve replacement)    Poor balance 11/25/2018   History of endometrial cancer 09/12/2017   Essential hypertension 08/29/2017   Severe calcific aortic stenosis 08/27/2017   Impacted cerumen of left ear 02/27/2017   Estrogen deficiency 11/05/2016   Routine general medical examination at a health care facility 10/15/2015   Benign paroxysmal positional vertigo 06/18/2014   Hyperlipidemia with target LDL less than 100 09/02/2012   Degenerative disk disease 08/31/2011   Personal history of colonic polyps 11/03/2010   Gout 08/25/2010   DERMATITIS, SEBORRHEIC 08/25/2010   Osteopenia 09/05/2007   History of poliomyelitis 08/26/2007   Obesity 08/26/2007   Anxiety and depression 08/26/2007   ALLERGIC RHINITIS 08/26/2007   GERD 08/26/2007   IBS 08/26/2007   OVERACTIVE BLADDER 08/26/2007   OSTEOARTHRITIS, HANDS, BILATERAL 08/26/2007   URINARY INCONTINENCE 08/26/2007   Past Medical  History:  Diagnosis Date   Anxiety and depression 08/26/2007   Qualifier: Diagnosis of  By: Glori Bickers MD, Carmell Austria    Arthritis    knees   Constipation, slow transit 11/03/2010   Degenerative disk disease 08/31/2011   Depression with anxiety    DERMATITIS, SEBORRHEIC 08/25/2010   Qualifier: Diagnosis of  By: Glori Bickers MD, Carmell Austria    Endometrial cancer [facility])    02/ 2019  Diagnosed with D&C/hysteroscopy with polypectomy   Essential hypertension 08/29/2017   Fibromyalgia    tx mobic   Full dentures    GERD 08/26/2007   Qualifier: Diagnosis of  By: Glori Bickers MD, Carmell Austria    Gout    Hearing loss    wears bilateral hearing aids   History of colon polyps    History of nonmelanoma skin cancer    right lower leg   History of poliomyelitis  08/26/2007   Qualifier: History of  By: Glori Bickers MD, Carmell Austria    History of radiation therapy    Endometium- 11/20/17-12/11/17- Vag Cuff -Dr. Gery Pray   Hyperlipidemia, mild 09/02/2012   Hypertension    Hypokalemia 01/02/2011   IBS (irritable bowel syndrome)    with constipation   Obesity 08/26/2007   Qualifier: Diagnosis of  By: Glori Bickers MD, Carmell Austria    OSTEOARTHRITIS, HANDS, BILATERAL 08/26/2007   Qualifier: Diagnosis of  By: Glori Bickers MD, Carmell Austria    Osteopenia    OVERACTIVE BLADDER 08/26/2007   Qualifier: Diagnosis of  By: Glori Bickers MD, Carmell Austria    Personal history of colonic polyps 11/03/2010   05/2018 13 polyps mostly adenomas, ssp's - recall 1 year 2020 Gatha Mayer, MD, [facility]    Poor balance 11/25/2018   With falls  Has rise in R shoe/post polio  Weak legs as well     S/P TAVR (transcatheter aortic valve replacement)    s/p TAVR with a 57m Edwards S3U via the TF approach by Dr. BCyndia Bentand Dr. MAngelena Form  Severe calcific aortic stenosis 08/27/2017    -> Became severe as of October 2020-referred for TAVR on 04/28/2019   URINARY INCONTINENCE 08/26/2007   Qualifier: Diagnosis of  By: TGlori BickersMD, MCarmell Austria   Wears hearing aid in both ears    Past Surgical History:  Procedure Laterality Date   [place] 11[place]16[place]   COLONOSCOPY N/A 06/24/2015   Procedure: COLONOSCOPY;  Surgeon: CGatha Mayer MD;  Location: [facility];  Service: Endoscopy;  Laterality: N/A;   DILATATION & CURETTAGE/HYSTEROSCOPY WITH MYOSURE N/A 08/09/2017   Procedure: DILATATION & CURETTAGE/HYSTEROSCOPY WITH MYOSURE;  Surgeon: CChristophe Louis MD;  [facility];  Service: Gynecology;  Laterality: N/A;  Polypectomy   DILATION AND CURETTAGE OF UTERUS     PMB   HAND SURGERY Bilateral 1999   Thumbs    KNEE SURGERY Bilateral 1998   x2   LYMPH NODE BIOPSY N/A 10/08/2017   Procedure: LYMPH NODE BIOPSY;  Surgeon: REveritt Amber  MD;  Location: [facility];  Service: Gynecology;  Laterality: N/A;   MULTIPLE TOOTH EXTRACTIONS     NASAL SINUS SURGERY  1976   polio surgery.     right foot - right leg 1.5" shorter than left leg - some wekness   RIGHT/LEFT HEART CATH AND CORONARY ANGIOGRAPHY N/A 04/09/2019   Procedure: RIGHT/LEFT HEART CATH AND CORONARY ANGIOGRAPHY;  Surgeon: HLeonie Man MD;  [facility];  Service: Cardiovascular;; Severe AS, mean gradient 50 mmHg P-P 55 mmHg (estimated AVA 0.76 cm).  Mildly elevated pulmonary pressures with elevated LVEDP and PCWP.  Angiographically normal coronary arteries, but tortuous.   ROBOTIC ASSISTED TOTAL HYSTERECTOMY WITH BILATERAL SALPINGO OOPHERECTOMY Bilateral 10/08/2017   Procedure: XI ROBOTIC ASSISTED TOTAL HYSTERECTOMY WITH BILATERAL SALPINGO OOPHORECTOMY;  Surgeon: [name], MD;  Location: [facility];  Service: Gynecology;  Laterality: Bilateral;   TEE WITHOUT CARDIOVERSION N/A 04/28/2019   Procedure: TRANSESOPHAGEAL ECHOCARDIOGRAM (TEE);  Surgeon: Burnell Blanks, MD;  Location: [facility];  Service: Open Heart Surgery;  Laterality: N/A;   THUMB ARTHROSCOPY  1999   TONSILLECTOMY  3521   77 years old   TOTAL KNEE ARTHROPLASTY Right 05/22/2022   Procedure: TOTAL KNEE ARTHROPLASTY;  Surgeon: [name], MD;  Location: [facility];  Service: Orthopedics;  Laterality: Right;   TRANSCATHETER AORTIC VALVE REPLACEMENT, TRANSFEMORAL N/A 04/28/2019   Procedure: TRANSCATHETER AORTIC VALVE REPLACEMENT, TRANSFEMORAL;  Surgeon: Burnell Blanks, MD;  Location: [facility]; ; 23 mm Sapien 3 Ultra THV via TF approach. (post-op mean AV gradient 13 mmHg)   TRANSTHORACIC ECHOCARDIOGRAM  05/15/2021   Normal/stable LV size and function.  EF 65 to 70%.  No RWMA.  GR 2 DD.  Moderate LA dilation.  Normal RV with mild RA dilation, normal RAP.Marland Kitchen  Severe MAC with no MR.  23 mm SAPIEN prosthetic TAVR valve well-seated.  Mean AVG 18 mm..  Trivial AI.  (No  significant change from 05/04/2020-mean AVG 14 mmHg).   TRANSTHORACIC ECHOCARDIOGRAM  02/17/2019   Mod LVH. GR 2 DD. Severe AS (mean gradient = 44 mmHg with Mod AI. mild MS.   TRANSTHORACIC ECHOCARDIOGRAM  04/29/2019; 05/2020   a)  (post TAVR): EF 60-65%.  GRII DD.  Mod LA dilation & nl RA.  Mod MAC.  No  AI (no-PVL), mean AV gradient 13 mmHg.;; b) (2nd post TAVR) EF 60 to 65%.  Severely increased thickness/LVH.  GRII DD with only mildly elevated left atrial pressure.  Normal RA size.  Severe MAC with only trace MR.  No MS.  TAVR valve prosthesis functioning well.  Trivial AI.  Mean gradient 15 mmHg with a peak 29.8 mmHg   TUBAL LIGATION     UPPER GI ENDOSCOPY     normal   WISDOM TOOTH EXTRACTION     Social History   Tobacco Use   Smoking status: Never   Smokeless tobacco: Never  Vaping Use   Vaping Use: Never used  Substance Use Topics   Alcohol use: No    Alcohol/week: 0.0 standard drinks of alcohol   Drug use: No   Family History  Problem Relation Age of Onset   Kidney disease Mother    Alzheimer's disease Mother    Stroke Father 10   Colon cancer Father 41       possibly colon cancer, possibly just polyps   Kidney disease Sister    Heart attack Sister    Colon cancer Paternal Aunt 37   Cancer Paternal Uncle        unk type   Esophageal cancer Neg Hx    Stomach cancer Neg Hx    Rectal cancer Neg Hx    Breast cancer Neg Hx    Ovarian cancer Neg Hx    Endometrial cancer Neg Hx    Pancreatic cancer Neg Hx    Prostate cancer Neg Hx    Allergies  Allergen Reactions  Clarithromycin Swelling and Other (See Comments)    Throat Swells   Penicillins Hives and Other (See Comments)    WELTS Has patient had a PCN reaction causing immediate rash, facial/tongue/throat swelling, SOB or lightheadedness with hypotension: No Has patient had a PCN reaction causing severe rash involving mucus membranes or skin necrosis: Unknown Has patient had a PCN reaction that required  hospitalization:No Has patient had a PCN reaction occurring within the last 10 years: No If all of the above answers are "NO", then may proceed with Cephalosporin use.    Other     Band-Aid, leaves mark on skin   Codeine Nausea And Vomiting   Current Outpatient Medications on File Prior to Visit  Medication Sig Dispense Refill   acetaminophen (TYLENOL) 500 MG tablet Take 1,000 mg by mouth every 6 (six) hours as needed for moderate pain.     acetic acid-hydrocortisone (VOSOL-HC) OTIC solution Place 2 drops into both ears 2 (two) times daily as needed (itching).     Ascorbic Acid (VITAMIN C) 1000 MG tablet Take 1,000 mg by mouth daily with breakfast.     aspirin EC 81 MG tablet Take 1 tablet (81 mg total) by mouth 2 (two) times daily. To prevent blood clots for 30 days after surgery. 60 tablet 0   Black Pepper-Turmeric (TURMERIC COMPLEX/BLACK PEPPER PO) Take 15 mLs by mouth daily with breakfast.      Calcium-Phosphorus-Vitamin D (CALCIUM/VITAMIN D3/ADULT GUMMY PO) Take 1 tablet by mouth 2 (two) times daily.      cetirizine (ZYRTEC) 10 MG tablet Take 10 mg by mouth daily.     cholecalciferol (VITAMIN D) 1000 UNITS tablet Take 1,000 Units by mouth daily with breakfast.      fluticasone (FLONASE) 50 MCG/ACT nasal spray Place 2 sprays into both nostrils at bedtime.      hydrochlorothiazide (HYDRODIURIL) 25 MG tablet Take 1 tablet (25 mg total) by mouth daily with lunch. (Patient taking differently: Take 25 mg by mouth See admin instructions. Take 25 mg by mouth Sunday, Monday, Wednesday and Friday) 90 tablet 3   lubiprostone (AMITIZA) 24 MCG capsule Take 1 capsule (24 mcg total) by mouth 2 (two) times daily. LUNCH & SUPPER 180 capsule 3   meloxicam (MOBIC) 7.5 MG tablet TAKE 1 TABLET BY MOUTH AT BEDTIME. 90 tablet 2   metoprolol succinate (TOPROL-XL) 25 MG 24 hr tablet Take 1 tablet (25 mg total) by mouth daily. (Patient taking differently: Take 25 mg by mouth at bedtime.) 90 tablet 3   mupirocin  ointment (BACTROBAN) 2 % Apply 1 Application topically 2 (two) times daily. To wound 22 g 0   NEXIUM 40 MG capsule Take 40 mg by mouth in the morning.     Polyethyl Glycol-Propyl Glycol 0.4-0.3 % SOLN Place 1-2 drops into both eyes at bedtime.     potassium chloride (KLOR-CON) 10 MEQ tablet Take 2 tablets (20 mEq total) by mouth 2 (two) times daily. 360 tablet 3   sodium chloride (OCEAN) 0.65 % nasal spray Place 1 spray into the nose daily.     triamcinolone cream (KENALOG) 0.1 % Apply topically 2 (two) times daily. To affected area/ rash (Patient taking differently: Apply 1 Application topically daily as needed (rash).) 30 g 0   venlafaxine XR (EFFEXOR XR) 75 MG 24 hr capsule Take 1 capsule (75 mg total) by mouth daily after breakfast. 90 capsule 3   amLODipine (NORVASC) 10 MG tablet Take 1 tablet (10 mg total) by mouth daily. 90 tablet  3   furosemide (LASIX) 20 MG tablet MAY TAKE 1 TABLET UP TO THREE TIMES A WEEK. DO NOT TAKE HCTZ THAT DAY. 30 tablet 11   No current facility-administered medications on file prior to visit.    Review of Systems  Constitutional:  Negative for activity change, appetite change, fatigue, fever and unexpected weight change.  HENT:  Negative for congestion, ear pain, rhinorrhea, sinus pressure and sore throat.   Eyes:  Negative for pain, redness and visual disturbance.  Respiratory:  Negative for cough, shortness of breath and wheezing.   Cardiovascular:  Negative for chest pain and palpitations.  Gastrointestinal:  Negative for abdominal pain, blood in stool, constipation and diarrhea.  Endocrine: Negative for polydipsia and polyuria.  Genitourinary:  Negative for dysuria, frequency and urgency.  Musculoskeletal:  Negative for arthralgias, back pain and myalgias.  Skin:  Negative for pallor and rash.  Allergic/Immunologic: Negative for environmental allergies.  Neurological:  Positive for dizziness. Negative for tremors, seizures, syncope, facial asymmetry,  speech difficulty, weakness, numbness and headaches.  Hematological:  Negative for adenopathy. Does not bruise/bleed easily.  Psychiatric/Behavioral:  Negative for decreased concentration and dysphoric mood. The patient is not nervous/anxious.        Objective:   Physical Exam Constitutional:      General: She is not in acute distress.    Appearance: Normal appearance. She is well-developed. She is not ill-appearing or diaphoretic.     Comments: Overweight   HENT:     Head: Normocephalic and atraumatic.     Comments: No facial or teporal tenderness    Right Ear: Tympanic membrane and ear canal normal.     Left Ear: Tympanic membrane and ear canal normal.     Nose: Nose normal.     Mouth/Throat:     Mouth: Mucous membranes are moist.  Eyes:     General:        Right eye: No discharge.        Left eye: No discharge.     Conjunctiva/sclera: Conjunctivae normal.     Pupils: Pupils are equal, round, and reactive to light.     Comments: Several beats of nystagmus bilaterally  Neck:     Thyroid: No thyromegaly.     Vascular: No carotid bruit or JVD.  Cardiovascular:     Rate and Rhythm: Normal rate and regular rhythm.     Heart sounds: Murmur heard.     No gallop.  Pulmonary:     Effort: Pulmonary effort is normal. No respiratory distress.     Breath sounds: Normal breath sounds. No stridor. No wheezing, rhonchi or rales.  Abdominal:     General: There is no distension or abdominal bruit.     Palpations: Abdomen is soft.  Musculoskeletal:     Cervical back: Normal range of motion and neck supple. No tenderness.     Right lower leg: No edema.     Left lower leg: No edema.  Lymphadenopathy:     Cervical: No cervical adenopathy.  Skin:    General: Skin is warm and dry.     Coloration: Skin is not pale.     Findings: No rash.  Neurological:     Mental Status: She is alert.     Cranial Nerves: Cranial nerves 2-12 are intact. No cranial nerve deficit.     Sensory: Sensation is  intact. No sensory deficit.     Motor: No weakness or tremor.     Coordination: Romberg sign negative. Coordination  normal. Finger-Nose-Finger Test normal.     Deep Tendon Reflexes: Reflexes are normal and symmetric. Reflexes normal.  Psychiatric:        Mood and Affect: Mood normal.           Assessment & Plan:   Problem List Items Addressed This Visit       Cardiovascular and Mediastinum   Essential hypertension (Chronic)    bp in fair control at this time  BP Readings from Last 1 Encounters:  07/20/22 133/60  No changes needed Most recent labs reviewed  Disc lifstyle change with low sodium diet and exercise  Plan to continue Amlodipine 10 -not taking  Hctz 25 mg  Toprol xl 25 mg  Furosemide 20 mg three days weekly        Nervous and Auditory   Benign paroxysmal positional vertigo - Primary    She has had this before, brief episode This follows finishing a course of prednisone for her knee and also having hearing aides cleaned   Much imporved for last 2 days  Reassuring exam  Has meclizine that expired, refilled this to have on hand (does not need today) Disc ER precautions and s/s of stroke  Update if not starting to improve in a week or if worsening   Would consider ENT ref if this returns or if worse

## 2022-07-22 NOTE — Assessment & Plan Note (Signed)
bp in fair control at this time  BP Readings from Last 1 Encounters:  07/20/22 133/60   No changes needed Most recent labs reviewed  Disc lifstyle change with low sodium diet and exercise  Plan to continue Amlodipine 10 -not taking  Hctz 25 mg  Toprol xl 25 mg  Furosemide 20 mg three days weekly

## 2022-07-22 NOTE — Assessment & Plan Note (Signed)
She has had this before, brief episode This follows finishing a course of prednisone for her knee and also having hearing aides cleaned   Much imporved for last 2 days  Reassuring exam  Has meclizine that expired, refilled this to have on hand (does not need today) Disc ER precautions and s/s of stroke  Update if not starting to improve in a week or if worsening   Would consider ENT ref if this returns or if worse

## 2022-07-24 DIAGNOSIS — I872 Venous insufficiency (chronic) (peripheral): Secondary | ICD-10-CM | POA: Diagnosis not present

## 2022-07-24 DIAGNOSIS — L570 Actinic keratosis: Secondary | ICD-10-CM | POA: Diagnosis not present

## 2022-07-24 DIAGNOSIS — L821 Other seborrheic keratosis: Secondary | ICD-10-CM | POA: Diagnosis not present

## 2022-07-24 DIAGNOSIS — L814 Other melanin hyperpigmentation: Secondary | ICD-10-CM | POA: Diagnosis not present

## 2022-07-24 DIAGNOSIS — C44729 Squamous cell carcinoma of skin of left lower limb, including hip: Secondary | ICD-10-CM | POA: Diagnosis not present

## 2022-07-24 DIAGNOSIS — D485 Neoplasm of uncertain behavior of skin: Secondary | ICD-10-CM | POA: Diagnosis not present

## 2022-07-26 NOTE — Progress Notes (Incomplete)
Veronica Greene is here today for follow up post radiation to the pelvic.  They completed their radiation on: 12/21/2017   Does the patient complain of any of the following:  Pain:*** Abdominal bloating: *** Diarrhea/Constipation: *** Nausea/Vomiting: *** Vaginal Discharge: *** Blood in Urine or Stool: *** Urinary Issues (dysuria/incomplete emptying/ incontinence/ increased frequency/urgency): *** Does patient report using vaginal dilator 2-3 times a week and/or sexually active 2-3 weeks: *** Post radiation skin changes: ***   Additional comments if applicable:

## 2022-08-02 ENCOUNTER — Ambulatory Visit
Admission: RE | Admit: 2022-08-02 | Discharge: 2022-08-02 | Disposition: A | Discharge status: Home / Self Care | Payer: PPO | Source: Ambulatory Visit | Attending: Radiation Oncology | Admitting: Radiation Oncology

## 2022-08-06 ENCOUNTER — Telehealth: Payer: Self-pay | Admitting: *Deleted

## 2022-08-06 NOTE — Telephone Encounter (Signed)
RETURNED PATIENT'S PHONE CALL, SPOKE WITH PATIENT. ?

## 2022-08-09 ENCOUNTER — Encounter: Payer: Self-pay | Admitting: Radiation Oncology

## 2022-08-09 ENCOUNTER — Ambulatory Visit
Admission: RE | Admit: 2022-08-09 | Discharge: 2022-08-09 | Disposition: A | Discharge status: Home / Self Care | Payer: PPO | Source: Ambulatory Visit | Attending: Radiation Oncology | Admitting: Radiation Oncology

## 2022-08-09 VITALS — BP 145/58 | HR 85 | Temp 97.8°F | Resp 20 | Ht 64.0 in | Wt 172.2 lb

## 2022-08-09 DIAGNOSIS — C541 Malignant neoplasm of endometrium: Secondary | ICD-10-CM | POA: Diagnosis not present

## 2022-08-09 DIAGNOSIS — Z7982 Long term (current) use of aspirin: Secondary | ICD-10-CM | POA: Insufficient documentation

## 2022-08-09 DIAGNOSIS — Z923 Personal history of irradiation: Secondary | ICD-10-CM | POA: Insufficient documentation

## 2022-08-09 DIAGNOSIS — Z79899 Other long term (current) drug therapy: Secondary | ICD-10-CM | POA: Diagnosis not present

## 2022-08-09 DIAGNOSIS — Z8542 Personal history of malignant neoplasm of other parts of uterus: Secondary | ICD-10-CM | POA: Diagnosis not present

## 2022-08-09 NOTE — Progress Notes (Signed)
Radiation Oncology         [phone] ________________________________  Name: Veronica Greene MRN: [id number]  Date: 08/09/2022  DOB: October 10, 1945  Follow-Up Visit Note  CC: Tower, [name], MD  Tower, [name], MD    ICD-10-CM   1. Endometrial cancer ([place])  C54.1     2. History of endometrial cancer  Z85.42       Diagnosis: Stage IB Grade 1 Endometrial Cancer   Interval Since Last Radiation: 4 years, 6 months, and 27 days   Radiation treatment dates:  Brachytherapy: 11/20/2017, 11/27/2017, 12/05/2017, 12/09/2017, 12/11/2017   Site/dose:  Vaginal cuff / 6 Gy in 5 fractions for a total dose of 30 Gy  Narrative:  The patient returns today for routine annual follow-up. She was last seen here for follow-up on 07/20/21. Since her last visit, the patient followed up with Dr. Berline Lopes on 01/23/22. During which time, the patient reported some instances of increased nocturia. She otherwise denied any symptoms concerning for disease recurrence and she was noted to be NED on examination.                    Of note: the patient has had several hospital encounters in the interval, detailed as follows: -- ED 10/06/21: the patient presented to the ED with nausea, vomiting, and diarrhea which started that morning. A viral etiology was suspected, and the patient advised to take zofran and increase in fluid intake.          -- The patient later presented to the [facility] on 04/06/22 with evaluation of a wound to her right anterior shin that is been present for 3 weeks. She noticed increased redness distal to the wound which concerned her, and she was ultimately treated for cellulitis with doxycycline.      -- The patient was admitted on 05/22/22 for a total knee arthroplaty  Pertinent imaging performed in the interval includes:  -- Lower venous DVT study on 06/27/22 showed no evidence of DVT in either LE.  -- Bilateral screening mammogram on 07/04/22 which showed no evidence of malignancy in  either breast.   Patient reports to be doing well today. She denies any vaginal bleeding, abnormal discharge, abdominal bloating, hematuria, or hematochezia. She underwent a knee replacement surgery in November and is using a cane to ambulate.   Allergies:  is allergic to clarithromycin, penicillins, other, and codeine.  Meds: Current Outpatient Medications  Medication Sig Dispense Refill   acetaminophen (TYLENOL) 500 MG tablet Take 1,000 mg by mouth every 6 (six) hours as needed for moderate pain.     acetic acid-hydrocortisone (VOSOL-HC) OTIC solution Place 2 drops into both ears 2 (two) times daily as needed (itching).     Ascorbic Acid (VITAMIN C) 1000 MG tablet Take 1,000 mg by mouth daily with breakfast.     Black Pepper-Turmeric (TURMERIC COMPLEX/BLACK PEPPER PO) Take 15 mLs by mouth daily with breakfast.      Calcium-Phosphorus-Vitamin D (CALCIUM/VITAMIN D3/ADULT GUMMY PO) Take 1 tablet by mouth 2 (two) times daily.      cetirizine (ZYRTEC) 10 MG tablet Take 10 mg by mouth daily.     cholecalciferol (VITAMIN D) 1000 UNITS tablet Take 1,000 Units by mouth daily with breakfast.      fluticasone (FLONASE) 50 MCG/ACT nasal spray Place 2 sprays into both nostrils at bedtime.      furosemide (LASIX) 20 MG tablet MAY TAKE 1 TABLET UP TO THREE TIMES A WEEK. DO  NOT TAKE HCTZ THAT DAY. 30 tablet 11   hydrochlorothiazide (HYDRODIURIL) 25 MG tablet Take 1 tablet (25 mg total) by mouth daily with lunch. (Patient taking differently: Take 25 mg by mouth See admin instructions. Take 25 mg by mouth Sunday, Monday, Wednesday and Friday) 90 tablet 3   lubiprostone (AMITIZA) 24 MCG capsule Take 1 capsule (24 mcg total) by mouth 2 (two) times daily. LUNCH & SUPPER 180 capsule 3   meclizine (ANTIVERT) 25 MG tablet Take 0.5-1 tablets (12.5-25 mg total) by mouth 3 (three) times daily as needed for dizziness. Caution of sedation 20 tablet 0   meloxicam (MOBIC) 7.5 MG tablet TAKE 1 TABLET BY MOUTH AT BEDTIME. 90  tablet 2   metoprolol succinate (TOPROL-XL) 25 MG 24 hr tablet Take 1 tablet (25 mg total) by mouth daily. (Patient taking differently: Take 25 mg by mouth at bedtime.) 90 tablet 3   mupirocin ointment (BACTROBAN) 2 % Apply 1 Application topically 2 (two) times daily. To wound 22 g 0   NEXIUM 40 MG capsule Take 40 mg by mouth in the morning.     Polyethyl Glycol-Propyl Glycol 0.4-0.3 % SOLN Place 1-2 drops into both eyes at bedtime.     potassium chloride (KLOR-CON) 10 MEQ tablet Take 2 tablets (20 mEq total) by mouth 2 (two) times daily. 360 tablet 3   sodium chloride (OCEAN) 0.65 % nasal spray Place 1 spray into the nose daily.     triamcinolone cream (KENALOG) 0.1 % Apply topically 2 (two) times daily. To affected area/ rash (Patient taking differently: Apply 1 Application topically daily as needed (rash).) 30 g 0   venlafaxine XR (EFFEXOR XR) 75 MG 24 hr capsule Take 1 capsule (75 mg total) by mouth daily after breakfast. 90 capsule 3   amLODipine (NORVASC) 10 MG tablet Take 1 tablet (10 mg total) by mouth daily. (Patient not taking: Reported on 08/09/2022) 90 tablet 3   aspirin EC 81 MG tablet Take 1 tablet (81 mg total) by mouth 2 (two) times daily. To prevent blood clots for 30 days after surgery. (Patient not taking: Reported on 08/09/2022) 60 tablet 0   No current facility-administered medications for this encounter.    Physical Findings: The patient is in no acute distress. Patient is alert and oriented.  height is '5\' 4"'$  (1.626 m) and weight is 172 lb 3.2 oz (78.1 kg). Her temperature is 97.8 F (36.6 C). Her blood pressure is 145/58 (abnormal) and her pulse is 85. Her respiration is 20 and oxygen saturation is 98%. .  . Lungs are clear to auscultation bilaterally. Heart has regular rate and rhythm. No palpable cervical, supraclavicular, or axillary adenopathy. Abdomen soft, non-tender, normal bowel sounds.  On pelvic examination the external genitalia were unremarkable. A speculum exam was  performed. There are no mucosal lesions noted in the vaginal vault.On bimanual and rectovaginal examination there were no pelvic masses appreciated.  Vaginal cuff intact.  Rectal sphincter tone good.   Lab Findings: Lab Results  Component Value Date   WBC 4.4 05/16/2022   HGB 11.8 (L) 05/16/2022   HCT 35.0 (L) 05/16/2022   MCV 91.6 05/16/2022   PLT 221 05/16/2022    Radiographic Findings: No results found.  Impression: Stage IB Grade 1 Endometrial Cancer   T  No evidence of disease recurrence on clinical exam today.  Plan:  Patient will follow up with Dr. Berline Lopes in 6 months. At that point she will be 5 years out from radiation. If she  remains disease free, regular follow up is no longer indicated with radiation oncology. Patient should continue annual follow ups with her gynecologist. It was a pleasure taking part in this patient's care. She knows to call with questions or concerns at any time.   25 minutes of total time was spent for this patient encounter, including preparation, face-to-face counseling with the patient and coordination of care, physical exam, and documentation of the encounter. ____________________________________   Leona Singleton, PA   Blair Promise, PhD, MD  This document serves as a record of services personally performed by Gery Pray, MD. It was created on his behalf by Roney Mans, a trained medical scribe. The creation of this record is based on the scribe's personal observations and the provider's statements to them. This document has been checked and approved by the attending provider.

## 2022-08-09 NOTE — Progress Notes (Signed)
Veronica Greene is here today for follow up post radiation to the pelvic.  They completed their radiation on: 12/11/17  Does the patient complain of any of the following:  Pain: No Abdominal bloating: No Diarrhea/Constipation: Patient has history of IBS taking Amtiza.  Nausea/Vomiting: No Vaginal Discharge: No Blood in Urine or Stool: No Urinary Issues (dysuria/incomplete emptying/ incontinence/ increased frequency/urgency): Reports urinary urgency and frequency at times,also reports odor when urinating.  Does patient report using vaginal dilator 2-3 times a week and/or sexually active 2-3 weeks: Yes, patient uses dilators at times.  Post radiation skin changes: No    Additional comments if applicable: Patient reports having total knee replacement to right knee.     BP (!) 145/58 (BP Location: Right Arm, Patient Position: Sitting, Cuff Size: Large)   Pulse 85   Temp 97.8 F (36.6 C)   Resp 20   Ht '5\' 4"'$  (1.626 m)   Wt 172 lb 3.2 oz (78.1 kg)   SpO2 98%   BMI 29.56 kg/m

## 2022-08-23 ENCOUNTER — Other Ambulatory Visit: Payer: Self-pay | Admitting: Family Medicine

## 2022-08-23 NOTE — Telephone Encounter (Signed)
Last filled on1/12/24 #20 tabs/ 0 refills, CPE scheduled 12/04/22

## 2022-08-23 NOTE — Telephone Encounter (Signed)
Before I refill-how is her dizziness?

## 2022-08-23 NOTE — Telephone Encounter (Signed)
Pt said after her appt with Dr. Glori Bickers her dizziness did improve but it did come back recently. Pt said she has had a few spells of dizziness. Pt did go to the chiropractor recently and got dizzy lying down so they check all her vitals and they were all normal. Pt said she's not sure what's causing the dizziness but she was stressed recently because her husband had a heart attack but he is stable and doing good. Also pt said she's not sure if PCP remembers she hears things all the time. Pt said most of the time it sounds like she hears a generator or airplane and at night when she wakes up to use the bathroom she hears music playing in the middle of the night

## 2022-08-23 NOTE — Telephone Encounter (Signed)
Thanks for the heads up I refilled Please schedule a follow up to discuss further and re eval

## 2022-08-24 NOTE — Telephone Encounter (Signed)
Pt notified of Dr. Marliss Coots comments and f/u appt scheduled

## 2022-08-28 ENCOUNTER — Encounter: Payer: Self-pay | Admitting: Family Medicine

## 2022-08-28 ENCOUNTER — Ambulatory Visit (INDEPENDENT_AMBULATORY_CARE_PROVIDER_SITE_OTHER): Payer: PPO | Admitting: Family Medicine

## 2022-08-28 VITALS — BP 140/60 | HR 75 | Temp 97.6°F | Ht 64.0 in | Wt 171.4 lb

## 2022-08-28 DIAGNOSIS — I10 Essential (primary) hypertension: Secondary | ICD-10-CM | POA: Diagnosis not present

## 2022-08-28 DIAGNOSIS — H8113 Benign paroxysmal vertigo, bilateral: Secondary | ICD-10-CM | POA: Diagnosis not present

## 2022-08-28 DIAGNOSIS — R440 Auditory hallucinations: Secondary | ICD-10-CM | POA: Diagnosis not present

## 2022-08-28 DIAGNOSIS — H9313 Tinnitus, bilateral: Secondary | ICD-10-CM

## 2022-08-28 NOTE — Assessment & Plan Note (Signed)
Over a year Only happens when getting up in the night to use bathroom She hears soft music  No new memory or personality change or confusion Does not sundown ? If she is fully awake with this  Is not bothered by Rev most recent labs Considering brain imaging for recent dizziness also  Will re visit if this progresses or new symptoms

## 2022-08-28 NOTE — Assessment & Plan Note (Signed)
Bp is higher than usual today  BP: (!) 140/60  Is more anxious   Will continue current medicines  Inst to hold meloxicam for nowbp in fair control at this time  BP Readings from Last 1 Encounters:  08/28/22 (!) 140/60    Amlodipine 10 -not taking  Hctz 25 mg  Toprol xl 25 mg  Furosemide 20 mg three days weekly

## 2022-08-28 NOTE — Assessment & Plan Note (Signed)
With vertigo symptoms and hearing loss Nl exam Hearing aides have new batteries Will f/u with ENT as planned

## 2022-08-28 NOTE — Patient Instructions (Signed)
I want to get a brain imaging study due to the dizziness (like a Cat Scan)   The dizziness may be vestibular/inner ear related but I also want to make sure there is nothing neurologic going on   Talk to your husband about this and let me know if you want to go forward  Also if you prefer [facility] or [place]   Please do call and set up an appt with Dr Victorio Palm office (ENT) for the vertigo and hearing  Let me know when you get that appointment    Take care of yourself  Be careful not to fall   Change position slowly   Pay attention to the music you hear at night (this is technically a hallucination) Let me know if it worsens or if you develop more memory trouble  Also let me know if you ever start to sleep walk

## 2022-08-28 NOTE — Assessment & Plan Note (Signed)
This improved and then returned Brief episodes /under 1 minute that occur upon lying down Reassuring exam Disc brain imaging-she wants to d/w husband before we schedule  Recommend ENT f/u -she wants to make her own appt with dr Victorio Palm office since she is due anyway  In conj with baseline tinnitus and hearing loss   Has some night time aud hallucination/ doubt related   I would like to get brain imaging-pt wants to talk to family before we arrange that   Er precautions noted Will call if sympt worsen

## 2022-08-28 NOTE — Progress Notes (Signed)
Subjective:    Patient ID: Veronica Greene, female    DOB: March 11, 1946, 77 y.o.   MRN: [id number]  HPI Pt presents with dizziness/vertigo and hearing problem   Wt Readings from Last 3 Encounters:  08/28/22 171 lb 6 oz (77.7 kg)  08/09/22 172 lb 3.2 oz (78.1 kg)  07/20/22 173 lb (78.5 kg)   29.42 kg/m  Vitals:   08/28/22 1121  BP: (!) 150/64  Pulse: 75  Temp: 97.6 F (36.4 C)  SpO2: 99%      She was seen in [place] for brief vertigo  Had improved at that time  It went away for over a week   Per pt- symptoms returned recently and has had a few spells of dizziness Only notices it when she lies down  Went to the chiropractor and got dizzy laying down  (vitals were normal) Has been stressed taking care of husb with recent MI   Does not bother her to turn her head  If she moves very fast- just a little   Takes meclizine as needed   She is hearing things as well  Usually sounds like a generator /airplane  Does wear hearing aides (they were checked last week and everything was good)   In the house hears a roaring- has tinnitus    At night she hears music when she gets up to go to the bathroom  Goes away when she lies down Going on for a long time = well over a year  No one is playing a radio   Is time for her to go to ENT- she goes at least once per year  Gets cerumen cleaned out  Dr Wilburn Cornelia (usually sees his PA)   Stress - husband with MI  He is doing better however  Mood is not too bad    No head trauma  Takes 81 mg asa daily  Meloxicam - takes it at night 7,5 mg) for pain / joint  No headahce  Get an occ one    HTN  bp is stable today  No cp or palpitations or headaches or edema  No side effects to medicines  BP Readings from Last 3 Encounters:  08/28/22 (!) 140/60  08/09/22 (!) 145/58  07/20/22 133/60     Amlodipine 10 -not taking  Hctz 25 mg  Toprol xl 25 mg  Furosemide 20 mg three days weekly  Pulse Readings from Last 3  Encounters:  08/28/22 75  08/09/22 85  07/20/22 73      Lab Results  Component Value Date   CREATININE 0.54 05/16/2022   BUN 13 05/16/2022   NA 143 05/16/2022   K 4.0 05/16/2022   CL 108 05/16/2022   CO2 26 05/16/2022   Lab Results  Component Value Date   ALT 15 11/23/2021   AST 18 11/23/2021   ALKPHOS 53 11/23/2021   BILITOT 0.8 11/23/2021   Lab Results  Component Value Date   WBC 4.4 05/16/2022   HGB 11.8 (L) 05/16/2022   HCT 35.0 (L) 05/16/2022   MCV 91.6 05/16/2022   PLT 221 05/16/2022   Lab Results  Component Value Date   TSH 2.22 11/23/2021   Balance is off because her R shoe lift is too high  (leg got a little longer after surgery)  May need new one   Patient Active Problem List   Diagnosis Date Noted   Auditory hallucination 08/28/2022   S/P total knee arthroplasty, right 05/22/2022   Rash  and nonspecific skin eruption 04/23/2022   Cellulitis of right leg 04/23/2022   Pre-op examination 03/30/2022   Toe pain 11/30/2021   Laryngopharyngeal reflux 11/20/2021   Tinnitus 08/18/2020   Itching of ear 08/18/2020   Multiple falls 03/30/2020   Lower extremity edema 02/21/2020   S/P TAVR (transcatheter aortic valve replacement)    Poor balance 11/25/2018   History of endometrial cancer 09/12/2017   Essential hypertension 08/29/2017   Severe calcific aortic stenosis 08/27/2017   Impacted cerumen of left ear 02/27/2017   Estrogen deficiency 11/05/2016   Routine general medical examination at a health care facility 10/15/2015   Benign paroxysmal positional vertigo 06/18/2014   Hyperlipidemia with target LDL less than 100 09/02/2012   Degenerative disk disease 08/31/2011   Personal history of colonic polyps 11/03/2010   Gout 08/25/2010   DERMATITIS, SEBORRHEIC 08/25/2010   Osteopenia 09/05/2007   History of poliomyelitis 08/26/2007   Obesity 08/26/2007   Anxiety and depression 08/26/2007   ALLERGIC RHINITIS 08/26/2007   GERD 08/26/2007   IBS  08/26/2007   OVERACTIVE BLADDER 08/26/2007   OSTEOARTHRITIS, HANDS, BILATERAL 08/26/2007   URINARY INCONTINENCE 08/26/2007   Past Medical History:  Diagnosis Date   Anxiety and depression 08/26/2007   Qualifier: Diagnosis of  By: Glori Bickers MD, Carmell Austria    Arthritis    knees   Constipation, slow transit 11/03/2010   Degenerative disk disease 08/31/2011   Depression with anxiety    DERMATITIS, SEBORRHEIC 08/25/2010   Qualifier: Diagnosis of  By: Glori Bickers MD, Carmell Austria    Endometrial cancer [facility])    02/ 2019  Diagnosed with D&C/hysteroscopy with polypectomy   Essential hypertension 08/29/2017   Fibromyalgia    tx mobic   Full dentures    GERD 08/26/2007   Qualifier: Diagnosis of  By: Glori Bickers MD, Carmell Austria    Gout    Hearing loss    wears bilateral hearing aids   History of colon polyps    History of nonmelanoma skin cancer    right lower leg   History of poliomyelitis 08/26/2007   Qualifier: History of  By: Glori Bickers MD, Carmell Austria    History of radiation therapy    Endometium- 11/20/17-12/11/17- Vag Cuff -Dr. Gery Pray   Hyperlipidemia, mild 09/02/2012   Hypertension    Hypokalemia 01/02/2011   IBS (irritable bowel syndrome)    with constipation   Obesity 08/26/2007   Qualifier: Diagnosis of  By: Glori Bickers MD, Carmell Austria    OSTEOARTHRITIS, HANDS, BILATERAL 08/26/2007   Qualifier: Diagnosis of  By: Glori Bickers MD, Carmell Austria    Osteopenia    OVERACTIVE BLADDER 08/26/2007   Qualifier: Diagnosis of  By: Glori Bickers MD, Carmell Austria    Personal history of colonic polyps 11/03/2010   05/2018 13 polyps mostly adenomas, ssp's - recall 1 year 2020 Gatha Mayer, MD, [facility]    Poor balance 11/25/2018   With falls  Has rise in R shoe/post polio  Weak legs as well     S/P TAVR (transcatheter aortic valve replacement)    s/p TAVR with a 62m Edwards S3U via the TF approach by Dr. BCyndia Bentand Dr. MAngelena Form  Severe calcific aortic stenosis 08/27/2017    -> Became severe as of October 2020-referred for TAVR on  04/28/2019   URINARY INCONTINENCE 08/26/2007   Qualifier: Diagnosis of  By: TGlori BickersMD, MCarmell Austria   Wears hearing aid in both ears    Past Surgical History:  Procedure Laterality Date   ANKLE  SURGERY Right 1956   CARPAL TUNNEL RELEASE Bilateral 1986, 1993   CATARACT EXTRACTION Right 11/16/2019   COLONOSCOPY N/A 06/24/2015   Procedure: COLONOSCOPY;  Surgeon: Gatha Mayer, MD;  Location: [facility];  Service: Endoscopy;  Laterality: N/A;   DILATATION & CURETTAGE/HYSTEROSCOPY WITH MYOSURE N/A 08/09/2017   Procedure: DILATATION & CURETTAGE/HYSTEROSCOPY WITH MYOSURE;  Surgeon: Christophe Louis, MD;  Location: [facility];  Service: Gynecology;  Laterality: N/A;  Polypectomy   DILATION AND CURETTAGE OF UTERUS     PMB   HAND SURGERY Bilateral 1999   Thumbs    KNEE SURGERY Bilateral 1998   x2   LYMPH NODE BIOPSY N/A 10/08/2017   Procedure: LYMPH NODE BIOPSY;  Surgeon: [name], MD;  Location: [facility];  Service: Gynecology;  Laterality: N/A;   MULTIPLE TOOTH EXTRACTIONS     NASAL SINUS SURGERY  1976   polio surgery.     right foot - right leg 1.5" shorter than left leg - some wekness   RIGHT/LEFT HEART CATH AND CORONARY ANGIOGRAPHY N/A 04/09/2019   Procedure: RIGHT/LEFT HEART CATH AND CORONARY ANGIOGRAPHY;  Surgeon: Leonie Man, MD;  Location: [facility];  Service: Cardiovascular;; Severe AS, mean gradient 50 mmHg P-P 55 mmHg (estimated AVA 0.76 cm).  Mildly elevated pulmonary pressures with elevated LVEDP and PCWP.  Angiographically normal coronary arteries, but tortuous.   ROBOTIC ASSISTED TOTAL HYSTERECTOMY WITH BILATERAL SALPINGO OOPHERECTOMY Bilateral 10/08/2017   Procedure: XI ROBOTIC ASSISTED TOTAL HYSTERECTOMY WITH BILATERAL SALPINGO OOPHORECTOMY;  Surgeon: [name], MD;  Location: [facility];  Service: Gynecology;  Laterality: Bilateral;   TEE WITHOUT CARDIOVERSION N/A 04/28/2019   Procedure: TRANSESOPHAGEAL ECHOCARDIOGRAM (TEE);  Surgeon: Burnell Blanks, MD;  Location:  [facility];  Service: Open Heart Surgery;  Laterality: N/A;   THUMB ARTHROSCOPY  1999   TONSILLECTOMY  7655   77 years old   TOTAL KNEE ARTHROPLASTY Right 05/22/2022   Procedure: TOTAL KNEE ARTHROPLASTY;  Surgeon: [name], MD;  Location: [facility];  Service: Orthopedics;  Laterality: Right;   TRANSCATHETER AORTIC VALVE REPLACEMENT, TRANSFEMORAL N/A 04/28/2019   Procedure: TRANSCATHETER AORTIC VALVE REPLACEMENT, TRANSFEMORAL;  Surgeon: Burnell Blanks, MD;  Location: [facility]; ; 23 mm Sapien 3 Ultra THV via TF approach. (post-op mean AV gradient 13 mmHg)   TRANSTHORACIC ECHOCARDIOGRAM  05/15/2021   Normal/stable LV size and function.  EF 65 to 70%.  No RWMA.  GR 2 DD.  Moderate LA dilation.  Normal RV with mild RA dilation, normal RAP.Marland Kitchen  Severe MAC with no MR.  23 mm SAPIEN prosthetic TAVR valve well-seated.  Mean AVG 18 mm..  Trivial AI.  (No significant change from 05/04/2020-mean AVG 14 mmHg).   TRANSTHORACIC ECHOCARDIOGRAM  02/17/2019   Mod LVH. GR 2 DD. Severe AS (mean gradient = 44 mmHg with Mod AI. mild MS.   TRANSTHORACIC ECHOCARDIOGRAM  04/29/2019; 05/2020   a)  (post TAVR): EF 60-65%.  GRII DD.  Mod LA dilation & nl RA.  Mod MAC.  No  AI (no-PVL), mean AV gradient 13 mmHg.;; b) (2nd post TAVR) EF 60 to 65%.  Severely increased thickness/LVH.  GRII DD with only mildly elevated left atrial pressure.  Normal RA size.  Severe MAC with only trace MR.  No MS.  TAVR valve prosthesis functioning well.  Trivial AI.  Mean gradient 15 mmHg with a peak 29.8 mmHg   TUBAL LIGATION     UPPER GI ENDOSCOPY     normal  WISDOM TOOTH EXTRACTION     Social History   Tobacco Use   Smoking status: Never   Smokeless tobacco: Never  Vaping Use   Vaping Use: Never used  Substance Use Topics   Alcohol use: No    Alcohol/week: 0.0 standard drinks of alcohol   Drug use: No   Family History  Problem Relation Age of Onset   Kidney disease Mother    Alzheimer's disease  Mother    Stroke Father 61   Colon cancer Father 76       possibly colon cancer, possibly just polyps   Kidney disease Sister    Heart attack Sister    Colon cancer Paternal Aunt 82   Cancer Paternal Uncle        unk type   Esophageal cancer Neg Hx    Stomach cancer Neg Hx    Rectal cancer Neg Hx    Breast cancer Neg Hx    Ovarian cancer Neg Hx    Endometrial cancer Neg Hx    Pancreatic cancer Neg Hx    Prostate cancer Neg Hx    Allergies  Allergen Reactions   Clarithromycin Swelling and Other (See Comments)    Throat Swells   Penicillins Hives and Other (See Comments)    WELTS Has patient had a PCN reaction causing immediate rash, facial/tongue/throat swelling, SOB or lightheadedness with hypotension: No Has patient had a PCN reaction causing severe rash involving mucus membranes or skin necrosis: Unknown Has patient had a PCN reaction that required hospitalization:No Has patient had a PCN reaction occurring within the last 10 years: No If all of the above answers are "NO", then may proceed with Cephalosporin use.    Other     Band-Aid, leaves mark on skin   Codeine Nausea And Vomiting   Current Outpatient Medications on File Prior to Visit  Medication Sig Dispense Refill   acetaminophen (TYLENOL) 500 MG tablet Take 1,000 mg by mouth every 6 (six) hours as needed for moderate pain.     acetic acid-hydrocortisone (VOSOL-HC) OTIC solution Place 2 drops into both ears 2 (two) times daily as needed (itching).     amLODipine (NORVASC) 10 MG tablet Take 1 tablet (10 mg total) by mouth daily. 90 tablet 3   Ascorbic Acid (VITAMIN C) 1000 MG tablet Take 1,000 mg by mouth daily with breakfast.     aspirin EC 81 MG tablet Take 1 tablet (81 mg total) by mouth 2 (two) times daily. To prevent blood clots for 30 days after surgery. 60 tablet 0   Black Pepper-Turmeric (TURMERIC COMPLEX/BLACK PEPPER PO) Take 15 mLs by mouth daily with breakfast.      Calcium-Phosphorus-Vitamin D  (CALCIUM/VITAMIN D3/ADULT GUMMY PO) Take 1 tablet by mouth 2 (two) times daily.      cetirizine (ZYRTEC) 10 MG tablet Take 10 mg by mouth daily.     cholecalciferol (VITAMIN D) 1000 UNITS tablet Take 1,000 Units by mouth daily with breakfast.      fluticasone (FLONASE) 50 MCG/ACT nasal spray Place 2 sprays into both nostrils at bedtime.      furosemide (LASIX) 20 MG tablet MAY TAKE 1 TABLET UP TO THREE TIMES A WEEK. DO NOT TAKE HCTZ THAT DAY. 30 tablet 11   hydrochlorothiazide (HYDRODIURIL) 25 MG tablet Take 1 tablet (25 mg total) by mouth daily with lunch. (Patient taking differently: Take 25 mg by mouth See admin instructions. Take 25 mg by mouth Sunday, Monday, Wednesday and Friday) 90 tablet 3  lubiprostone (AMITIZA) 24 MCG capsule Take 1 capsule (24 mcg total) by mouth 2 (two) times daily. LUNCH & SUPPER 180 capsule 3   meclizine (ANTIVERT) 25 MG tablet TAKE 1/2 TO 1 TABLET BY MOUTH 3 TIMES DAILY AS NEEDED FOR DIZZINESS. CAUTION OF SEDATION 20 tablet 0   meloxicam (MOBIC) 7.5 MG tablet TAKE 1 TABLET BY MOUTH AT BEDTIME. 90 tablet 2   metoprolol succinate (TOPROL-XL) 25 MG 24 hr tablet Take 1 tablet (25 mg total) by mouth daily. (Patient taking differently: Take 25 mg by mouth at bedtime.) 90 tablet 3   mupirocin ointment (BACTROBAN) 2 % Apply 1 Application topically 2 (two) times daily. To wound 22 g 0   NEXIUM 40 MG capsule Take 40 mg by mouth in the morning.     Polyethyl Glycol-Propyl Glycol 0.4-0.3 % SOLN Place 1-2 drops into both eyes at bedtime.     potassium chloride (KLOR-CON) 10 MEQ tablet Take 2 tablets (20 mEq total) by mouth 2 (two) times daily. 360 tablet 3   sodium chloride (OCEAN) 0.65 % nasal spray Place 1 spray into the nose daily.     triamcinolone cream (KENALOG) 0.1 % Apply topically 2 (two) times daily. To affected area/ rash (Patient taking differently: Apply 1 Application topically daily as needed (rash).) 30 g 0   venlafaxine XR (EFFEXOR XR) 75 MG 24 hr capsule Take 1  capsule (75 mg total) by mouth daily after breakfast. 90 capsule 3   No current facility-administered medications on file prior to visit.    Review of Systems  Constitutional:  Negative for activity change, appetite change, fatigue, fever and unexpected weight change.  HENT:  Positive for hearing loss. Negative for congestion, ear discharge, ear pain, facial swelling, rhinorrhea, sinus pressure and sore throat.   Eyes:  Negative for pain, redness and visual disturbance.  Respiratory:  Negative for cough, shortness of breath and wheezing.   Cardiovascular:  Negative for chest pain and palpitations.  Gastrointestinal:  Negative for abdominal pain, blood in stool, constipation and diarrhea.  Endocrine: Negative for polydipsia and polyuria.  Genitourinary:  Negative for dysuria, frequency and urgency.  Musculoskeletal:  Positive for arthralgias and back pain. Negative for myalgias.       Leg length has changed on r and needs shoe rise reduced   Skin:  Negative for pallor and rash.  Allergic/Immunologic: Negative for environmental allergies.  Neurological:  Positive for dizziness and headaches. Negative for tremors, seizures, syncope, facial asymmetry, speech difficulty, weakness and numbness.  Hematological:  Negative for adenopathy. Does not bruise/bleed easily.  Psychiatric/Behavioral:  Negative for decreased concentration and dysphoric mood. The patient is nervous/anxious.        Objective:   Physical Exam Constitutional:      General: She is not in acute distress.    Appearance: Normal appearance. She is well-developed. She is obese. She is not diaphoretic.  HENT:     Head: Normocephalic and atraumatic.     Comments: No sinus tenderness    Right Ear: Tympanic membrane, ear canal and external ear normal.     Left Ear: Tympanic membrane and external ear normal.     Nose: Nose normal. No congestion or rhinorrhea.     Mouth/Throat:     Mouth: Mucous membranes are moist.     Pharynx:  No oropharyngeal exudate or posterior oropharyngeal erythema.  Eyes:     General: No scleral icterus.       Right eye: No discharge.  Left eye: No discharge.     Conjunctiva/sclera: Conjunctivae normal.     Pupils: Pupils are equal, round, and reactive to light.     Comments: No nystagmus  Neck:     Thyroid: No thyromegaly.     Vascular: No carotid bruit or JVD.     Trachea: No tracheal deviation.  Cardiovascular:     Rate and Rhythm: Normal rate and regular rhythm.     Heart sounds: Murmur heard.  Pulmonary:     Effort: Pulmonary effort is normal. No respiratory distress.     Breath sounds: Normal breath sounds. No wheezing or rales.  Abdominal:     General: Bowel sounds are normal. There is no distension.     Palpations: Abdomen is soft. There is no mass.     Tenderness: There is no abdominal tenderness.  Musculoskeletal:        General: No tenderness.     Cervical back: Full passive range of motion without pain, normal range of motion and neck supple. No tenderness.  Lymphadenopathy:     Cervical: No cervical adenopathy.  Skin:    General: Skin is warm and dry.     Coloration: Skin is not jaundiced or pale.     Findings: Bruising present. No erythema or rash.     Comments: Bruise on L posterior upper calf  Some varicosities  Neurological:     Mental Status: She is alert and oriented to person, place, and time.     Cranial Nerves: No cranial nerve deficit, dysarthria or facial asymmetry.     Sensory: No sensory deficit.     Motor: No weakness, tremor, atrophy, abnormal muscle tone, seizure activity or pronator drift.     Coordination: Romberg sign negative. Coordination normal. Finger-Nose-Finger Test normal.     Gait: Gait normal.     Deep Tendon Reflexes: Reflexes are normal and symmetric. Reflexes normal.     Comments: No focal cerebellar signs   Baseline gait with raised r shoe   Psychiatric:        Behavior: Behavior normal.        Thought Content: Thought  content normal.           Assessment & Plan:   Problem List Items Addressed This Visit       Cardiovascular and Mediastinum   Essential hypertension (Chronic)    Bp is higher than usual today  BP: (!) 140/60  Is more anxious   Will continue current medicines  Inst to hold meloxicam for nowbp in fair control at this time  BP Readings from Last 1 Encounters:  08/28/22 (!) 140/60   Amlodipine 10 -not taking  Hctz 25 mg  Toprol xl 25 mg  Furosemide 20 mg three days weekly        Nervous and Auditory   Benign paroxysmal positional vertigo - Primary    This improved and then returned Brief episodes /under 1 minute that occur upon lying down Reassuring exam Disc brain imaging-she wants to d/w husband before we schedule  Recommend ENT f/u -she wants to make her own appt with dr Victorio Palm office since she is due anyway  In conj with baseline tinnitus and hearing loss   Has some night time aud hallucination/ doubt related   I would like to get brain imaging-pt wants to talk to family before we arrange that   Er precautions noted Will call if sympt worsen         Other   Auditory  hallucination    Over a year Only happens when getting up in the night to use bathroom She hears soft music  No new memory or personality change or confusion Does not sundown ? If she is fully awake with this  Is not bothered by Rev most recent labs Considering brain imaging for recent dizziness also  Will re visit if this progresses or new symptoms         Tinnitus    With vertigo symptoms and hearing loss Nl exam Hearing aides have new batteries Will f/u with ENT as planned

## 2022-08-29 ENCOUNTER — Telehealth: Payer: Self-pay | Admitting: *Deleted

## 2022-08-29 DIAGNOSIS — M1712 Unilateral primary osteoarthritis, left knee: Secondary | ICD-10-CM | POA: Diagnosis not present

## 2022-08-29 NOTE — Telephone Encounter (Signed)
Returned patient's phone call, spoke with patient 

## 2022-08-31 ENCOUNTER — Telehealth: Payer: Self-pay | Admitting: Family Medicine

## 2022-08-31 NOTE — Telephone Encounter (Signed)
Pt.notified

## 2022-08-31 NOTE — Telephone Encounter (Signed)
Patient was advised to see ENT, she called to make an appointment ,but they are needing a referral.

## 2022-08-31 NOTE — Addendum Note (Signed)
Addended by: Loura Pardon A on: 08/31/2022 03:05 PM   Modules accepted: Orders

## 2022-08-31 NOTE — Telephone Encounter (Signed)
I put the referral in  Please let us know if you don't hear in 1-2 weeks

## 2022-09-10 DIAGNOSIS — M1712 Unilateral primary osteoarthritis, left knee: Secondary | ICD-10-CM | POA: Diagnosis not present

## 2022-10-31 DIAGNOSIS — L820 Inflamed seborrheic keratosis: Secondary | ICD-10-CM | POA: Diagnosis not present

## 2022-10-31 DIAGNOSIS — Z86007 Personal history of in-situ neoplasm of skin: Secondary | ICD-10-CM | POA: Diagnosis not present

## 2022-10-31 DIAGNOSIS — L538 Other specified erythematous conditions: Secondary | ICD-10-CM | POA: Diagnosis not present

## 2022-10-31 DIAGNOSIS — D225 Melanocytic nevi of trunk: Secondary | ICD-10-CM | POA: Diagnosis not present

## 2022-10-31 DIAGNOSIS — Z08 Encounter for follow-up examination after completed treatment for malignant neoplasm: Secondary | ICD-10-CM | POA: Diagnosis not present

## 2022-10-31 DIAGNOSIS — Z09 Encounter for follow-up examination after completed treatment for conditions other than malignant neoplasm: Secondary | ICD-10-CM | POA: Diagnosis not present

## 2022-10-31 DIAGNOSIS — L570 Actinic keratosis: Secondary | ICD-10-CM | POA: Diagnosis not present

## 2022-10-31 DIAGNOSIS — L821 Other seborrheic keratosis: Secondary | ICD-10-CM | POA: Diagnosis not present

## 2022-10-31 DIAGNOSIS — L814 Other melanin hyperpigmentation: Secondary | ICD-10-CM | POA: Diagnosis not present

## 2022-11-06 ENCOUNTER — Ambulatory Visit: Payer: PPO | Attending: Nurse Practitioner | Admitting: Nurse Practitioner

## 2022-11-06 ENCOUNTER — Encounter: Payer: Self-pay | Admitting: Nurse Practitioner

## 2022-11-06 VITALS — BP 130/58 | HR 74 | Ht 66.0 in | Wt 169.6 lb

## 2022-11-06 DIAGNOSIS — H9319 Tinnitus, unspecified ear: Secondary | ICD-10-CM | POA: Diagnosis not present

## 2022-11-06 DIAGNOSIS — R600 Localized edema: Secondary | ICD-10-CM | POA: Diagnosis not present

## 2022-11-06 DIAGNOSIS — I350 Nonrheumatic aortic (valve) stenosis: Secondary | ICD-10-CM | POA: Diagnosis not present

## 2022-11-06 DIAGNOSIS — E782 Mixed hyperlipidemia: Secondary | ICD-10-CM

## 2022-11-06 DIAGNOSIS — I10 Essential (primary) hypertension: Secondary | ICD-10-CM | POA: Diagnosis not present

## 2022-11-06 DIAGNOSIS — Z952 Presence of prosthetic heart valve: Secondary | ICD-10-CM

## 2022-11-06 NOTE — Patient Instructions (Signed)
Medication Instructions:  Your physician recommends that you continue on your current medications as directed. Please refer to the Current Medication list given to you today.   *If you need a refill on your cardiac medications before your next appointment, please call your pharmacy*   Lab Work: NONE ordered at this time of appointment   If you have labs (blood work) drawn today and your tests are completely normal, you will receive your results only by: MyChart Message (if you have MyChart) OR A paper copy in the mail If you have any lab test that is abnormal or we need to change your treatment, we will call you to review the results.   Testing/Procedures: NONE ordered at this time of appointment     Follow-Up: At [facility], you and your health needs are our priority.  As part of our continuing mission to provide you with exceptional heart care, we have created designated Provider Care Teams.  These Care Teams include your primary Cardiologist (physician) and Advanced Practice Providers (APPs -  Physician Assistants and Nurse Practitioners) who all work together to provide you with the care you need, when you need it.  We recommend signing up for the patient portal called "MyChart".  Sign up information is provided on this After Visit Summary.  MyChart is used to connect with patients for Virtual Visits (Telemedicine).  Patients are able to view lab/test results, encounter notes, upcoming appointments, etc.  Non-urgent messages can be sent to your provider as well.   To learn more about what you can do with MyChart, go to [email or web address].    Your next appointment:   6 month(s)  Provider:   David Harding, MD     Other Instructions   

## 2022-11-06 NOTE — Progress Notes (Signed)
Office Visit    Patient Name: Veronica Greene Date of Encounter: 11/06/2022  Primary Care Provider:  [name], MD Primary Cardiologist:  Bryan Lemma, MD  Chief Complaint   77 year old female with a history  of severe AS s/p TAVR (04/2019), hypertension, GERD, fibromyalgia, endometrial cancer, postpolio syndrome, depression, and anxiety who presents for follow-up related to aortic stenosis and hypertension.   Past Medical History    Past Medical History:  Diagnosis Date   Anxiety and depression 08/26/2007   Qualifier: Diagnosis of  By: Milinda Antis MD, Colon Flattery    Arthritis    knees   Constipation, slow transit 11/03/2010   Degenerative disk disease 08/31/2011   Depression with anxiety    DERMATITIS, SEBORRHEIC 08/25/2010   Qualifier: Diagnosis of  By: Milinda Antis MD, Colon Flattery    Endometrial cancer [facility])    02/ 2019  Diagnosed with D&C/hysteroscopy with polypectomy   Essential hypertension 08/29/2017   Fibromyalgia    tx mobic   Full dentures    GERD 08/26/2007   Qualifier: Diagnosis of  By: Milinda Antis MD, Colon Flattery    Gout    Hearing loss    wears bilateral hearing aids   History of colon polyps    History of nonmelanoma skin cancer    right lower leg   History of poliomyelitis 08/26/2007   Qualifier: History of  By: Milinda Antis MD, Colon Flattery    History of radiation therapy    Endometium- 11/20/17-12/11/17- Vag Cuff -Dr. Antony Blackbird   Hyperlipidemia, mild 09/02/2012   Hypertension    Hypokalemia 01/02/2011   IBS (irritable bowel syndrome)    with constipation   Obesity 08/26/2007   Qualifier: Diagnosis of  By: Milinda Antis MD, Colon Flattery    OSTEOARTHRITIS, HANDS, BILATERAL 08/26/2007   Qualifier: Diagnosis of  By: Milinda Antis MD, Colon Flattery    Osteopenia    OVERACTIVE BLADDER 08/26/2007   Qualifier: Diagnosis of  By: Milinda Antis MD, Colon Flattery    Personal history of colonic polyps 11/03/2010   05/2018 13 polyps mostly adenomas, ssp's - recall 1 year 2020 Iva Boop, MD, [facility]     Poor balance 11/25/2018   With falls  Has rise in R shoe/post polio  Weak legs as well     S/P TAVR (transcatheter aortic valve replacement)    s/p TAVR with a 23mm Edwards S3U via the TF approach by Dr. Laneta Simmers and Dr. Clifton James   Severe calcific aortic stenosis 08/27/2017    -> Became severe as of October 2020-referred for TAVR on 04/28/2019   URINARY INCONTINENCE 08/26/2007   Qualifier: Diagnosis of  By: Milinda Antis MD, Colon Flattery    Wears hearing aid in both ears    Past Surgical History:  Procedure Laterality Date   ANKLE SURGERY Right 1956   CARPAL TUNNEL RELEASE Bilateral 1986, 1993   CATARACT EXTRACTION Right 11/16/2019   COLONOSCOPY N/A 06/24/2015   Procedure: COLONOSCOPY;  Surgeon: Iva Boop, MD;  Location: [facility];  Service: Endoscopy;  Laterality: N/A;   DILATATION & CURETTAGE/HYSTEROSCOPY WITH MYOSURE N/A 08/09/2017   Procedure: DILATATION & CURETTAGE/HYSTEROSCOPY WITH MYOSURE;  Surgeon: Gerald Leitz, MD;  Location: [facility] OR[facility];  Service: Gynecology;  Laterality: N/A;  Polypectomy   DILATION AND CURETTAGE OF UTERUS     PMB   HAND SURGERY Bilateral 1999   Thumbs    KNEE SURGERY Bilateral 1998   x2   LYMPH NODE BIOPSY N/A 10/08/2017   Procedure: LYMPH NODE BIOPSY;  Surgeon: Andrey Farmer,  Kara Mead, MD;  Location: [facility];  Service: Gynecology;  Laterality: N/A;   MULTIPLE TOOTH EXTRACTIONS     NASAL SINUS SURGERY  1976   polio surgery.     right foot - right leg 1.5" shorter than left leg - some wekness   RIGHT/LEFT HEART CATH AND CORONARY ANGIOGRAPHY N/A 04/09/2019   Procedure: RIGHT/LEFT HEART CATH AND CORONARY ANGIOGRAPHY;  Surgeon: Marykay Lex, MD;  Location: [facility] INVASIVE CV LAB;  Service: Cardiovascular;; Severe AS, mean gradient 50 mmHg P-P 55 mmHg (estimated AVA 0.76 cm).  Mildly elevated pulmonary pressures with elevated LVEDP and PCWP.  Angiographically normal coronary arteries, but tortuous.   ROBOTIC ASSISTED TOTAL HYSTERECTOMY WITH BILATERAL SALPINGO OOPHERECTOMY Bilateral  10/08/2017   Procedure: XI ROBOTIC ASSISTED TOTAL HYSTERECTOMY WITH BILATERAL SALPINGO OOPHORECTOMY;  Surgeon: Adolphus Birchwood, MD;  Location: [facility];  Service: Gynecology;  Laterality: Bilateral;   TEE WITHOUT CARDIOVERSION N/A 04/28/2019   Procedure: TRANSESOPHAGEAL ECHOCARDIOGRAM (TEE);  Surgeon: Kathleene Hazel, MD;  Location[facility] INVASIVE CV LAB;  Service: Open Heart Surgery;  Laterality: N/A;   THUMB ARTHROSCOPY  1999   TONSILLECTOMY  4957   77 years old   TOTAL KNEE ARTHROPLASTY Right 05/22/2022   Procedure: TOTAL KNEE ARTHROPLASTY;  Surgeon: Sheral Apley, MD;  Location: [facility];  Service: Orthopedics;  Laterality: Right;   TRANSCATHETER AORTIC VALVE REPLACEMENT, TRANSFEMORAL N/A 04/28/2019   Procedure: TRANSCATHETER AORTIC VALVE REPLACEMENT, TRANSFEMORAL;  Surgeon: Kathleene Hazel, MD;  Location: [facility] INVASIVE CV LAB; ; 23 mm Sapien 3 Ultra THV via TF approach. (post-op mean AV gradient 13 mmHg)   TRANSTHORACIC ECHOCARDIOGRAM  05/15/2021   Normal/stable LV size and function.  EF 65 to 70%.  No RWMA.  GR 2 DD.  Moderate LA dilation.  Normal RV with mild RA dilation, normal RAP.Marland Kitchen  Severe MAC with no MR.  23 mm SAPIEN prosthetic TAVR valve well-seated.  Mean AVG 18 mm..  Trivial AI.  (No significant change from 05/04/2020-mean AVG 14 mmHg).   TRANSTHORACIC ECHOCARDIOGRAM  02/17/2019   Mod LVH. GR 2 DD. Severe AS (mean gradient = 44 mmHg with Mod AI. mild MS.   TRANSTHORACIC ECHOCARDIOGRAM  04/29/2019; 05/2020   a)  (post TAVR): EF 60-65%.  GRII DD.  Mod LA dilation & nl RA.  Mod MAC.  No  AI (no-PVL), mean AV gradient 13 mmHg.;; b) (2nd post TAVR) EF 60 to 65%.  Severely increased thickness/LVH.  GRII DD with only mildly elevated left atrial pressure.  Normal RA size.  Severe MAC with only trace MR.  No MS.  TAVR valve prosthesis functioning well.  Trivial AI.  Mean gradient 15 mmHg with a peak 29.8 mmHg   TUBAL LIGATION     UPPER GI ENDOSCOPY     normal   WISDOM TOOTH EXTRACTION       Allergies  Allergies  Allergen Reactions   Clarithromycin Swelling and Other (See Comments)    Throat Swells   Penicillins Hives and Other (See Comments)    WELTS Has patient had a PCN reaction causing immediate rash, facial/tongue/throat swelling, SOB or lightheadedness with hypotension: No Has patient had a PCN reaction causing severe rash involving mucus membranes or skin necrosis: Unknown Has patient had a PCN reaction that required hospitalization:No Has patient had a PCN reaction occurring within the last 10 years: No If all of the above answers are "NO", then may proceed with Cephalosporin use.    Other     Band-Aid, leaves mark on  skin   Codeine Nausea And Vomiting     Labs/Other Studies Reviewed    The following studies were reviewed today: Echo 06/2022:  IMPRESSIONS     1. Left ventricular ejection fraction, by estimation, is 70 to 75%. The  left ventricle has hyperdynamic function. The left ventricle has no  regional wall motion abnormalities. Left ventricular diastolic parameters  are consistent with Grade II diastolic  dysfunction (pseudonormalization). Elevated left ventricular end-diastolic  pressure. The average left ventricular global longitudinal strain is -21.0  %. The global longitudinal strain is normal.   2. Right ventricular systolic function is normal. The right ventricular  size is normal. There is moderately elevated pulmonary artery systolic  pressure.   3. Left atrial size was severely dilated.   4. Right atrial size was moderately dilated.   5. The mitral valve is degenerative. Mild mitral valve regurgitation. No  evidence of mitral stenosis. Severe mitral annular calcification.   6. TAVR gradients unchanged from 05/2021. The aortic valve has been  repaired/replaced. Aortic valve regurgitation is mild. No aortic stenosis  is present. There is a 23 mm Sapien prosthetic (TAVR) valve present in the  aortic position. Aortic valve area,   by VTI measures 2.06 cm. Aortic valve mean gradient measures 17.0 mmHg.  Aortic valve Vmax measures 2.79 m/s.   7. The inferior vena cava is normal in size with greater than 50%  respiratory variability, suggesting right atrial pressure of 3 mmHg.   R/LHC 04/2019: There is severe aortic valve stenosis. Hemodynamic findings consistent with mild pulmonary hypertension. LV end diastolic pressure is moderately elevated. Angiographically normal coronary arteries, very tortuous   SUMMARY Severe calcific aortic stenosis  -mean aortic gradient 50 mmHg with peak to peak 55 mmHg.  (Estimated valve area 0.76 cm) Mild pulmonary hypertension secondary to elevated LVEDP and PCWP Angiographically normal coronary arteries   RECOMMENDATIONS Standard post cath care and discharge home after bed rest She will be met by the structural heart/valve clinic team coordinator Carlean Jews, [place]) prior to discharge today.  Will be scheduled in the valve clinic. Follow-up with me as scheduled. No change in medications.   Recent Labs: 11/23/2021: ALT 15; TSH 2.22 05/16/2022: BUN 13; Creatinine, Ser 0.54; Hemoglobin 11.8; Platelets 221; Potassium 4.0; Sodium 143  Recent Lipid Panel    Component Value Date/Time   CHOL 179 11/23/2021 0853   TRIG 153.0 (H) 11/23/2021 0853   HDL 48.80 11/23/2021 0853   CHOLHDL 4 11/23/2021 0853   VLDL 30.6 11/23/2021 0853   LDLCALC 100 (H) 11/23/2021 0853    History of Present Illness    77 year old female with the above past medical history including severe AS s/p TAVR (04/2019), hypertension, GERD, fibromyalgia, endometrial cancer, postpolio syndrome, depression, and anxiety.   She has a history of postpolio syndrome with right leg weakness and decreased length.  She has a history of issues with balance and recurrent falls.  She also has a history of severe aortic stenosis, initially diagnosed in 2013 when she was noted to have a heart murmur.  She reported progressive  fatigue.  Cardiac catheterization in October 2020 showed no obstructive CAD.  She underwent successful TAVR with a 23 mm Edwards SAPIEN 3 ultra THV via the TF approach in October 2020.  Postop echo showed EF 60 to 65%, normally functioning TAVR with a mean gradient of 13 mmHg, and no PVL. Echocardiogram in November 2022 showed EF 65 to 70%, normal LV function, G2 DD, stable prosthetic TAVR, mean gradient  18.0 mmHg. She was last seen in the office on 05/07/2022 and was stable from a cardiac standpoint.  Repeat echocardiogram in 06/2022 showed EF 70 to 75%, hyperdynamic LV function, no RWMA, G2 DD, normal RV systolic function, moderately elevated PASP, severe LAE, moderate RAE, mild mitral valve regurgitation, severe MAC, mild aortic valve regurgitation, stable aortic valve prosthesis, mean gradient 17 mmHg.   She presents today for follow-up accompanied by her husband.  Since her last visit she has done well from a cardiac standpoint.  She denies any chest pain, dyspnea, edema, PND, orthopnea, weight gain. She notes occasional fleeting palpitations, denies any associated symptoms. She had a knee replacement in 05/2022 and has recovered well.  She had a mechanical fall that occurred several weeks ago which resulted in a significant bruise to her left hip, this is now in the healing stages. She denies any other injury. Overall, she reports feeling well.   Home Medications    Current Outpatient Medications  Medication Sig Dispense Refill   acetaminophen (TYLENOL) 500 MG tablet Take 1,000 mg by mouth every 6 (six) hours as needed for moderate pain.     acetic acid-hydrocortisone (VOSOL-HC) OTIC solution Place 2 drops into both ears 2 (two) times daily as needed (itching).     amLODipine (NORVASC) 10 MG tablet Take 1 tablet (10 mg total) by mouth daily. 90 tablet 3   Ascorbic Acid (VITAMIN C) 1000 MG tablet Take 1,000 mg by mouth daily with breakfast.     aspirin EC 81 MG tablet Take 1 tablet (81 mg total) by  mouth 2 (two) times daily. To prevent blood clots for 30 days after surgery. 60 tablet 0   Black Pepper-Turmeric (TURMERIC COMPLEX/BLACK PEPPER PO) Take 15 mLs by mouth daily with breakfast.      Calcium-Phosphorus-Vitamin D (CALCIUM/VITAMIN D3/ADULT GUMMY PO) Take 1 tablet by mouth 2 (two) times daily.      cetirizine (ZYRTEC) 10 MG tablet Take 10 mg by mouth daily.     cholecalciferol (VITAMIN D) 1000 UNITS tablet Take 1,000 Units by mouth daily with breakfast.      fluticasone (FLONASE) 50 MCG/ACT nasal spray Place 2 sprays into both nostrils at bedtime.      furosemide (LASIX) 20 MG tablet MAY TAKE 1 TABLET UP TO THREE TIMES A WEEK. DO NOT TAKE HCTZ THAT DAY. 30 tablet 11   hydrochlorothiazide (HYDRODIURIL) 25 MG tablet Take 1 tablet (25 mg total) by mouth daily with lunch. (Patient taking differently: Take 25 mg by mouth See admin instructions. Take 25 mg by mouth Sunday, Monday, Wednesday and Friday) 90 tablet 3   lubiprostone (AMITIZA) 24 MCG capsule Take 1 capsule (24 mcg total) by mouth 2 (two) times daily. LUNCH & SUPPER 180 capsule 3   meclizine (ANTIVERT) 25 MG tablet TAKE 1/2 TO 1 TABLET BY MOUTH 3 TIMES DAILY AS NEEDED FOR DIZZINESS. CAUTION OF SEDATION 20 tablet 0   meloxicam (MOBIC) 7.5 MG tablet TAKE 1 TABLET BY MOUTH AT BEDTIME. 90 tablet 2   metoprolol succinate (TOPROL-XL) 25 MG 24 hr tablet Take 1 tablet (25 mg total) by mouth daily. (Patient taking differently: Take 25 mg by mouth at bedtime.) 90 tablet 3   mupirocin ointment (BACTROBAN) 2 % Apply 1 Application topically 2 (two) times daily. To wound 22 g 0   NEXIUM 40 MG capsule Take 40 mg by mouth in the morning.     Polyethyl Glycol-Propyl Glycol 0.4-0.3 % SOLN Place 1-2 drops into both eyes at  bedtime.     potassium chloride (KLOR-CON) 10 MEQ tablet Take 2 tablets (20 mEq total) by mouth 2 (two) times daily. 360 tablet 3   sodium chloride (OCEAN) 0.65 % nasal spray Place 1 spray into the nose daily.     triamcinolone cream  (KENALOG) 0.1 % Apply topically 2 (two) times daily. To affected area/ rash (Patient taking differently: Apply 1 Application topically daily as needed (rash).) 30 g 0   venlafaxine XR (EFFEXOR XR) 75 MG 24 hr capsule Take 1 capsule (75 mg total) by mouth daily after breakfast. 90 capsule 3   No current facility-administered medications for this visit.     Review of Systems    She denies chest pain, palpitations, dyspnea, pnd, orthopnea, n, v, dizziness, syncope, edema, weight gain, or early satiety. All other systems reviewed and are otherwise negative except as noted above.   Physical Exam    VS:  BP (!) 130/58   Pulse 74   Ht 5\' 6"  (1.676 m)   Wt 169 lb 9.6 oz (76.9 kg)   SpO2 99%   BMI 27.37 kg/m  GEN: Well nourished, well developed, in no acute distress. HEENT: normal. Neck: Supple, no JVD, carotid bruits, or masses. Cardiac: RRR, no murmurs, rubs, or gallops. No clubbing, cyanosis, edema.  Radials/DP/PT 2+ and equal bilaterally.  Respiratory:  Respirations regular and unlabored, clear to auscultation bilaterally. GI: Soft, nontender, nondistended, BS + x 4. MS: no deformity or atrophy. Skin: warm and dry, no rash. Neuro:  Strength and sensation are intact. Psych: Normal affect.  Accessory Clinical Findings    ECG personally reviewed by me today - No EKG in office today.    Lab Results  Component Value Date   WBC 4.4 05/16/2022   HGB 11.8 (L) 05/16/2022   HCT 35.0 (L) 05/16/2022   MCV 91.6 05/16/2022   PLT 221 05/16/2022   Lab Results  Component Value Date   CREATININE 0.54 05/16/2022   BUN 13 05/16/2022   NA 143 05/16/2022   K 4.0 05/16/2022   CL 108 05/16/2022   CO2 26 05/16/2022   Lab Results  Component Value Date   ALT 15 11/23/2021   AST 18 11/23/2021   ALKPHOS 53 11/23/2021   BILITOT 0.8 11/23/2021   Lab Results  Component Value Date   CHOL 179 11/23/2021   HDL 48.80 11/23/2021   LDLCALC 100 (H) 11/23/2021   TRIG 153.0 (H) 11/23/2021   CHOLHDL  4 11/23/2021    Lab Results  Component Value Date   HGBA1C 5.1 04/24/2019    Assessment & Plan    1. Severe aortic stenosis: S/p TAVR in October 2020. Most recent echo in 06/2022 showed EF 70 to 75%, hyperdynamic LV function, no RWMA, G2 DD, normal RV systolic function, moderately elevated PASP, severe LAE, moderate RAE, mild mitral valve regurgitation, severe MAC, mild aortic valve regurgitation, stable aortic valve prosthesis, mean gradient 17 mmHg, no significant change from prior echo.  Euvolemic and well compensated on exam. Discussed the use of prophylactic antibiotics before dental work and other surgeries.  Will likely repeat echo in 06/2023.  2. Hypertension: BP well controlled. Continue current antihypertensive regimen.    3. Hyperlipidemia: LDL was 100 in 11/2021. Not on statin at this time. Monitored and managed per PCP.     4. Chronic bilateral lower extremity edema: Most recent echo as above. No edema on exam today, controlled with Lasix/HCTZ.   5.  Tinnitus/dizziness: Has followed with ENT.  Ongoing  but overall stable.   6. Disposition: Follow-up in 6 months.     Joylene Grapes, NP 11/06/2022, 1:54 PM

## 2022-11-25 ENCOUNTER — Telehealth: Payer: Self-pay | Admitting: Family Medicine

## 2022-11-25 DIAGNOSIS — I10 Essential (primary) hypertension: Secondary | ICD-10-CM

## 2022-11-25 DIAGNOSIS — Z131 Encounter for screening for diabetes mellitus: Secondary | ICD-10-CM

## 2022-11-25 DIAGNOSIS — E785 Hyperlipidemia, unspecified: Secondary | ICD-10-CM

## 2022-11-25 NOTE — Telephone Encounter (Signed)
-----   Message from Alvina Chou sent at 11/19/2022 10:53 AM EDT ----- Regarding: Lab orders for Tuesday, 5.21.24 Patient is scheduled for CPX labs, please order future labs, Thanks , Camelia Eng

## 2022-11-27 ENCOUNTER — Other Ambulatory Visit (INDEPENDENT_AMBULATORY_CARE_PROVIDER_SITE_OTHER): Payer: PPO

## 2022-11-27 DIAGNOSIS — Z131 Encounter for screening for diabetes mellitus: Secondary | ICD-10-CM | POA: Diagnosis not present

## 2022-11-27 DIAGNOSIS — E785 Hyperlipidemia, unspecified: Secondary | ICD-10-CM | POA: Diagnosis not present

## 2022-11-27 DIAGNOSIS — I10 Essential (primary) hypertension: Secondary | ICD-10-CM | POA: Diagnosis not present

## 2022-11-27 LAB — CBC WITH DIFFERENTIAL/PLATELET
Basophils Absolute: 0 10*3/uL (ref 0.0–0.1)
Basophils Relative: 0.5 % (ref 0.0–3.0)
Eosinophils Absolute: 0.1 10*3/uL (ref 0.0–0.7)
Eosinophils Relative: 3.1 % (ref 0.0–5.0)
HCT: 35.8 % — ABNORMAL LOW (ref 36.0–46.0)
Hemoglobin: 12.3 g/dL (ref 12.0–15.0)
Lymphocytes Relative: 20.4 % (ref 12.0–46.0)
Lymphs Abs: 0.9 10*3/uL (ref 0.7–4.0)
MCHC: 34.5 g/dL (ref 30.0–36.0)
MCV: 89.4 fl (ref 78.0–100.0)
Monocytes Absolute: 0.2 10*3/uL (ref 0.1–1.0)
Monocytes Relative: 4.9 % (ref 3.0–12.0)
Neutro Abs: 3 10*3/uL (ref 1.4–7.7)
Neutrophils Relative %: 71.1 % (ref 43.0–77.0)
Platelets: 238 10*3/uL (ref 150.0–400.0)
RBC: 4 Mil/uL (ref 3.87–5.11)
RDW: 13.6 % (ref 11.5–15.5)
WBC: 4.2 10*3/uL (ref 4.0–10.5)

## 2022-11-27 LAB — COMPREHENSIVE METABOLIC PANEL
ALT: 13 U/L (ref 0–35)
AST: 19 U/L (ref 0–37)
Albumin: 4.4 g/dL (ref 3.5–5.2)
Alkaline Phosphatase: 54 U/L (ref 39–117)
BUN: 10 mg/dL (ref 6–23)
CO2: 29 mEq/L (ref 19–32)
Calcium: 9.5 mg/dL (ref 8.4–10.5)
Chloride: 102 mEq/L (ref 96–112)
Creatinine, Ser: 0.6 mg/dL (ref 0.40–1.20)
GFR: 86.37 mL/min (ref >60.00)
Glucose, Bld: 99 mg/dL (ref 70–99)
Potassium: 3.8 mEq/L (ref 3.5–5.1)
Sodium: 139 mEq/L (ref 135–145)
Total Bilirubin: 0.7 mg/dL (ref 0.2–1.2)
Total Protein: 6.9 g/dL (ref 6.0–8.3)

## 2022-11-27 LAB — LIPID PANEL
Cholesterol: 175 mg/dL (ref 0–200)
HDL: 50.5 mg/dL (ref >39.00)
LDL Cholesterol: 99 mg/dL (ref 0–99)
NonHDL: 124.04
Total CHOL/HDL Ratio: 3
Triglycerides: 125 mg/dL (ref 0.0–149.0)
VLDL: 25 mg/dL (ref 0.0–40.0)

## 2022-11-27 LAB — TSH: TSH: 2.39 u[IU]/mL (ref 0.35–5.50)

## 2022-11-27 LAB — HEMOGLOBIN A1C: Hgb A1c MFr Bld: 5 % (ref 4.6–6.5)

## 2022-12-04 ENCOUNTER — Encounter: Payer: PPO | Admitting: Family Medicine

## 2022-12-05 ENCOUNTER — Other Ambulatory Visit: Payer: Self-pay | Admitting: Family Medicine

## 2022-12-06 NOTE — Telephone Encounter (Signed)
Meloxicam last filled on 02/28/22 #90 tabs/ 2 refills,  K filled on 11/30/21 #360 tabs 3 refills   CPE scheduled 12/11/22

## 2022-12-11 ENCOUNTER — Encounter: Payer: Self-pay | Admitting: Family Medicine

## 2022-12-11 ENCOUNTER — Ambulatory Visit (INDEPENDENT_AMBULATORY_CARE_PROVIDER_SITE_OTHER): Payer: PPO | Admitting: Family Medicine

## 2022-12-11 VITALS — BP 122/64 | HR 65 | Temp 97.5°F | Ht 66.0 in | Wt 168.0 lb

## 2022-12-11 DIAGNOSIS — I10 Essential (primary) hypertension: Secondary | ICD-10-CM | POA: Diagnosis not present

## 2022-12-11 DIAGNOSIS — Z131 Encounter for screening for diabetes mellitus: Secondary | ICD-10-CM | POA: Diagnosis not present

## 2022-12-11 DIAGNOSIS — F419 Anxiety disorder, unspecified: Secondary | ICD-10-CM

## 2022-12-11 DIAGNOSIS — F32A Depression, unspecified: Secondary | ICD-10-CM

## 2022-12-11 DIAGNOSIS — E785 Hyperlipidemia, unspecified: Secondary | ICD-10-CM

## 2022-12-11 DIAGNOSIS — W57XXXA Bitten or stung by nonvenomous insect and other nonvenomous arthropods, initial encounter: Secondary | ICD-10-CM

## 2022-12-11 DIAGNOSIS — K219 Gastro-esophageal reflux disease without esophagitis: Secondary | ICD-10-CM | POA: Diagnosis not present

## 2022-12-11 DIAGNOSIS — Z0001 Encounter for general adult medical examination with abnormal findings: Secondary | ICD-10-CM | POA: Diagnosis not present

## 2022-12-11 DIAGNOSIS — M8580 Other specified disorders of bone density and structure, unspecified site: Secondary | ICD-10-CM

## 2022-12-11 DIAGNOSIS — Z86010 Personal history of colonic polyps: Secondary | ICD-10-CM

## 2022-12-11 DIAGNOSIS — S40861A Insect bite (nonvenomous) of right upper arm, initial encounter: Secondary | ICD-10-CM | POA: Diagnosis not present

## 2022-12-11 MED ORDER — DOXYCYCLINE HYCLATE 100 MG PO TABS: 100.0000 mg | ORAL_TABLET | Freq: Two times a day (BID) | ORAL | 0 refills | Status: DC

## 2022-12-11 NOTE — Assessment & Plan Note (Signed)
Disc goals for lipids and reasons to control them Rev last labs with pt Rev low sat fat diet in detail Stable HDL over 50 and LDL under 696 Continue to manage with diet

## 2022-12-11 NOTE — Assessment & Plan Note (Signed)
Reviewed health habits including diet and exercise and skin cancer prevention Reviewed appropriate screening tests for age  Also reviewed health mt list, fam hx and immunization status , as well as social and family history   See HPI Labs reviewed and ordered Pt plans to get shingrix at pharmacy  Colonoscopy ref done  Mammogram utd 06/2022  Dexa utd / taking vit D Discussed fall prevention  PHQ is stable  New problem- tick bite on R arm / treated

## 2022-12-11 NOTE — Assessment & Plan Note (Signed)
Bp is higher than usual today  BP: 122/64  Is more anxious   Will continue current medicines  Inst to hold meloxicam for nowbp in fair control at this time  BP Readings from Last 1 Encounters:  12/11/22 122/64    Amlodipine 10   Hctz 25 mg 4 times weekly  Toprol xl 25 mg  Furosemide 20 mg three days weekly

## 2022-12-11 NOTE — Patient Instructions (Addendum)
If you are interested in the new shingles vaccine (Shingrix) - call your local pharmacy to check on coverage and availability  If affordable, get on a wait list at your pharmacy to get the vaccine.  Keep working on the bike  Add some strength training to your routine, this is important for bone and brain health and can reduce your risk of falls and help your body use insulin properly and regulate weight  Light weights, exercise bands , and internet videos are a good way to start  Yoga (chair or regular), machines , floor exercises or a gym with machines are also good options     I will put in referral for colonoscopy   [facility]  [phone]  Take the doxycycline for tick bite Let us know if it is not improving  Watch for fever, rash, headache or any new symptoms

## 2022-12-11 NOTE — Assessment & Plan Note (Signed)
Lab Results  Component Value Date   HGBA1C 5.0 11/27/2022   disc imp of low glycemic diet and wt loss to prevent DM2  Commended wt loss so far

## 2022-12-11 NOTE — Progress Notes (Signed)
Subjective:    Patient ID: Veronica Greene, female    DOB: 1946-07-05, 77 y.o.   MRN: [id number]  HPI Here for health maintenance exam and to review chronic medical problems   Also has a tick bite R upper arm   Wt Readings from Last 3 Encounters:  12/11/22 168 lb (76.2 kg)  11/06/22 169 lb 9.6 oz (76.9 kg)  08/28/22 171 lb 6 oz (77.7 kg)   27.12 kg/m  Lost a little weight  Eating less since her knee surgery  Is more active also   Some stress- husband had MI in January / mild- is doing ok   Tick bite R arm - getting bigger/ red itchy area  2 wk ago  Tiny tick  No fever Had one day of upset stomach  No rash or headache  Vitals:   12/11/22 1001  BP: 122/64  Pulse: 65  Temp: (!) 97.5 F (36.4 C)  SpO2: 95%   Immunization History  Administered Date(s) Administered   Fluad Quad(high Dose 65+) 05/06/2019, 05/04/2020, 03/30/2022   Influenza Split 04/08/2008, 06/30/2009   Influenza Whole 04/08/2008, 06/30/2009   Influenza, High Dose Seasonal PF 05/17/2015, 05/07/2016, 05/07/2017, 04/22/2018, 05/06/2019, 05/04/2020, 05/04/2021   Influenza-Unspecified 05/09/2014, 05/17/2015, 05/07/2016, 05/07/2017, 04/22/2018, 05/04/2021   PFIZER Comirnaty(Gray Top)Covid-19 Tri-Sucrose Vaccine 07/30/2019, 08/20/2019, 04/23/2020   PFIZER(Purple Top)SARS-COV-2 Vaccination 07/30/2019, 08/20/2019, 04/23/2020   Pneumococcal Conjugate-13 10/12/2014   Pneumococcal Polysaccharide-23 08/25/2010   Pneumococcal-Unspecified 08/25/2010   Td 10/15/2003   Td,absorbed, Preservative Free, Adult Use, Lf Unspecified 10/15/2003   Tdap 06/18/2018   Zoster, Live 08/12/2008   Health Maintenance Due  Topic Date Due   Zoster Vaccines- Shingrix (1 of 2) Never done   COVID-19 Vaccine (7 - 2023-24 season) 03/09/2022   Colonoscopy  11/10/2022   Medicare Annual Wellness (AWV)  12/16/2022   Shingrix-is interested   Colonoscopy 11/2019 with 3 y recall 4 adenomas  Dr Leone Payor  Wants to go ahead and  schedule that    Mammogram 06/2022 Self breast exam: no lumps   Dexa  12/2020 -normal bmd (breast center) Falls (prone to falling due to polio) -has a rise in shoe and uses a cane [place] a month ago- tripped on her cane in a restaurant - no fractures  Fractures-none  Supplements - vitamin D Exercise - active/ cleaning and gardening  Has a bicycle - her knee is just now getting good enough to do it   Mood- good  Effexor xr     12/11/2022   10:07 AM 12/15/2021   10:26 AM 12/14/2020    2:19 PM 11/29/2020    9:40 AM 11/24/2019    9:07 AM  Depression screen PHQ 2/9  Decreased Interest 0 0 2 1 0  Down, Depressed, Hopeless 1 0 2 0 1  PHQ - 2 Score 1 0 4 1 1   Altered sleeping  0 0 0 0  Tired, decreased energy  0 0 2 0  Change in appetite  0 0 0 0  Feeling bad or failure about yourself   0 0 0 0  Trouble concentrating  0 0 0 0  Moving slowly or fidgety/restless  0 0 0 0  Suicidal thoughts  0 0 0 0  PHQ-9 Score  0 4 3 1   Difficult doing work/chores  Not difficult at all Not difficult at all Not difficult at all Not difficult at all      HTN bp is stable today  No cp or palpitations or headaches or  edema  No side effects to medicines  BP Readings from Last 3 Encounters:  12/11/22 122/64  11/06/22 (!) 130/58  08/28/22 (!) 140/60    Amlodipine 10 mg Hctz 25 mg four days weekly  Toprol xl 25 mg qhs Furosemide 20 mg three days weekly    Pulse Readings from Last 3 Encounters:  12/11/22 65  11/06/22 74  08/28/22 75      Last metabolic panel Lab Results  Component Value Date   GLUCOSE 99 11/27/2022   NA 139 11/27/2022   K 3.8 11/27/2022   CL 102 11/27/2022   CO2 29 11/27/2022   BUN 10 11/27/2022   CREATININE 0.60 11/27/2022   GFRNONAA >60 05/16/2022   CALCIUM 9.5 11/27/2022   PROT 6.9 11/27/2022   ALBUMIN 4.4 11/27/2022   BILITOT 0.7 11/27/2022   ALKPHOS 54 11/27/2022   AST 19 11/27/2022   ALT 13 11/27/2022   ANIONGAP 9 05/16/2022  GFR 86.3  GERD Nexium= was  from ENT   Hyperlipidemia Lab Results  Component Value Date   CHOL 175 11/27/2022   CHOL 179 11/23/2021   CHOL 170 11/25/2020   Lab Results  Component Value Date   HDL 50.50 11/27/2022   HDL 48.80 11/23/2021   HDL 44.50 11/25/2020   Lab Results  Component Value Date   LDLCALC 99 11/27/2022   LDLCALC 100 (H) 11/23/2021   LDLCALC 96 11/25/2020   Lab Results  Component Value Date   TRIG 125.0 11/27/2022   TRIG 153.0 (H) 11/23/2021   TRIG 148.0 11/25/2020   Lab Results  Component Value Date   CHOLHDL 3 11/27/2022   CHOLHDL 4 11/23/2021   CHOLHDL 4 11/25/2020   No results found for: "LDLDIRECT" Pt wants to avoid statins   Diabetes screen Lab Results  Component Value Date   HGBA1C 5.0 11/27/2022   Lab Results  Component Value Date   WBC 4.2 11/27/2022   HGB 12.3 11/27/2022   HCT 35.8 (L) 11/27/2022   MCV 89.4 11/27/2022   PLT 238.0 11/27/2022   Lab Results  Component Value Date   TSH 2.39 11/27/2022   Patient Active Problem List   Diagnosis Date Noted   Tick bite of right upper arm 12/11/2022   Diabetes mellitus screening 11/25/2022   Auditory hallucination 08/28/2022   S/P total knee arthroplasty, right 05/22/2022   Laryngopharyngeal reflux 11/20/2021   Tinnitus 08/18/2020   Lower extremity edema 02/21/2020   S/P TAVR (transcatheter aortic valve replacement)    Poor balance 11/25/2018   History of endometrial cancer 09/12/2017   Essential hypertension 08/29/2017   Severe calcific aortic stenosis 08/27/2017   Estrogen deficiency 11/05/2016   Encounter for general adult medical examination with abnormal findings 10/15/2015   Benign paroxysmal positional vertigo 06/18/2014   Hyperlipidemia with target LDL less than 100 09/02/2012   Degenerative disk disease 08/31/2011   Personal history of colonic polyps 11/03/2010   Gout 08/25/2010   DERMATITIS, SEBORRHEIC 08/25/2010   Osteopenia 09/05/2007   History of poliomyelitis 08/26/2007   Anxiety and  depression 08/26/2007   ALLERGIC RHINITIS 08/26/2007   GERD 08/26/2007   IBS 08/26/2007   OVERACTIVE BLADDER 08/26/2007   OSTEOARTHRITIS, HANDS, BILATERAL 08/26/2007   URINARY INCONTINENCE 08/26/2007   Past Medical History:  Diagnosis Date   Anxiety and depression 08/26/2007   Qualifier: Diagnosis of  By: Milinda Antis MD, Colon Flattery    Arthritis    knees   Constipation, slow transit 11/03/2010   Degenerative disk disease 08/31/2011  Depression with anxiety    DERMATITIS, SEBORRHEIC 08/25/2010   Qualifier: Diagnosis of  By: Milinda Antis MD, Colon Flattery    Endometrial cancer [facility])    02/ 2019  Diagnosed with D&C/hysteroscopy with polypectomy   Essential hypertension 08/29/2017   Fibromyalgia    tx mobic   Full dentures    GERD 08/26/2007   Qualifier: Diagnosis of  By: Milinda Antis MD, Colon Flattery    Gout    Hearing loss    wears bilateral hearing aids   History of colon polyps    History of nonmelanoma skin cancer    right lower leg   History of poliomyelitis 08/26/2007   Qualifier: History of  By: Milinda Antis MD, Colon Flattery    History of radiation therapy    Endometium- 11/20/17-12/11/17- Vag Cuff -Dr. Antony Blackbird   Hyperlipidemia, mild 09/02/2012   Hypertension    Hypokalemia 01/02/2011   IBS (irritable bowel syndrome)    with constipation   Obesity 08/26/2007   Qualifier: Diagnosis of  By: Milinda Antis MD, Colon Flattery    OSTEOARTHRITIS, HANDS, BILATERAL 08/26/2007   Qualifier: Diagnosis of  By: Milinda Antis MD, Colon Flattery    Osteopenia    OVERACTIVE BLADDER 08/26/2007   Qualifier: Diagnosis of  By: Milinda Antis MD, Colon Flattery    Personal history of colonic polyps 11/03/2010   05/2018 13 polyps mostly adenomas, ssp's - recall 1 year 2020 Iva Boop, MD, [facility]    Poor balance 11/25/2018   With falls  Has rise in R shoe/post polio  Weak legs as well     S/P TAVR (transcatheter aortic valve replacement)    s/p TAVR with a 23mm Edwards S3U via the TF approach by Dr. Laneta Simmers and Dr. Clifton James   Severe calcific aortic  stenosis 08/27/2017    -> Became severe as of October 2020-referred for TAVR on 04/28/2019   URINARY INCONTINENCE 08/26/2007   Qualifier: Diagnosis of  By: Milinda Antis MD, Colon Flattery    Wears hearing aid in both ears    Past Surgical History:  Procedure Laterality Date   ANKLE SURGERY Right 1956   CARPAL TUNNEL RELEASE Bilateral 1986, 1993   CATARACT EXTRACTION Right 11/16/2019   COLONOSCOPY N/A 06/24/2015   Procedure: COLONOSCOPY;  Surgeon: Iva Boop, MD;  Location: [facility] END[facility];  Service: Endoscopy;  Laterality: N/A;   DILATATION & CURETTAGE/HYSTEROSCOPY WITH MYOSURE N/A 08/09/2017   Procedure: DILATATION & CURETTAGE/HYSTEROSCOPY WITH MYOSURE;  Surgeon: Gerald Leitz, MD;  Location: [facility] ORS;  Service: Gynecology;  Laterality: N/A;  Polypectomy   DILATION AND CURETTAGE OF UTERUS     PMB   HAND SURGERY Bilateral 1999   Thumbs    KNEE SURGERY Bilateral 1998   x2   LYMPH NODE BIOPSY N/A 10/08/2017   Procedure: LYMPH NODE BIOPSY;  Surgeon: Adolphus Birchwood, MD;  Location: [facility] OR[facility];  Service: Gynecology;  Laterality: N/A;   MULTIPLE TOOTH EXTRACTIONS     NASAL SINUS SURGERY  1976   polio surgery.     right foot - right leg 1.5" shorter than left leg - some wekness   RIGHT/LEFT HEART CATH AND CORONARY ANGIOGRAPHY N/A 04/09/2019   Procedure: RIGHT/LEFT HEART CATH AND CORONARY ANGIOGRAPHY;  Surgeon: Marykay Lex, MD;  Location: [facility] INVASIVE CV LAB;  Service: Cardiovascular;; Severe AS, mean gradient 50 mmHg P-P 55 mmHg (estimated AVA 0.76 cm).  Mildly elevated pulmonary pressures with elevated LVEDP and PCWP.  Angiographically normal coronary arteries, but tortuous.   ROBOTIC ASSISTED TOTAL HYSTERECTOMY WITH BILATERAL SALPINGO  OOPHERECTOMY Bilateral 10/08/2017   Procedure: XI ROBOTIC ASSISTED TOTAL HYSTERECTOMY WITH BILATERAL SALPINGO OOPHORECTOMY;  Surgeon: Adolphus Birchwood, MD;  Location: [facility];  Service: Gynecology;  Laterality: Bilateral;   TEE WITHOUT CARDIOVERSION N/A 04/28/2019   Procedure:  TRANSESOPHAGEAL ECHOCARDIOGRAM (TEE);  Surgeon: Kathleene Hazel, MD;  Location[facility] INVASIVE CV LAB;  Service: Open Heart Surgery;  Laterality: N/A;   THUMB ARTHROSCOPY  1999   TONSILLECTOMY  6775   77 years old   TOTAL KNEE ARTHROPLASTY Right 05/22/2022   Procedure: TOTAL KNEE ARTHROPLASTY;  Surgeon: Sheral Apley, MD;  Location: [facility];  Service: Orthopedics;  Laterality: Right;   TRANSCATHETER AORTIC VALVE REPLACEMENT, TRANSFEMORAL N/A 04/28/2019   Procedure: TRANSCATHETER AORTIC VALVE REPLACEMENT, TRANSFEMORAL;  Surgeon: Kathleene Hazel, MD;  Location: [facility] INVASIVE CV LAB; ; 23 mm Sapien 3 Ultra THV via TF approach. (post-op mean AV gradient 13 mmHg)   TRANSTHORACIC ECHOCARDIOGRAM  05/15/2021   Normal/stable LV size and function.  EF 65 to 70%.  No RWMA.  GR 2 DD.  Moderate LA dilation.  Normal RV with mild RA dilation, normal RAP.Marland Kitchen  Severe MAC with no MR.  23 mm SAPIEN prosthetic TAVR valve well-seated.  Mean AVG 18 mm..  Trivial AI.  (No significant change from 05/04/2020-mean AVG 14 mmHg).   TRANSTHORACIC ECHOCARDIOGRAM  02/17/2019   Mod LVH. GR 2 DD. Severe AS (mean gradient = 44 mmHg with Mod AI. mild MS.   TRANSTHORACIC ECHOCARDIOGRAM  04/29/2019; 05/2020   a)  (post TAVR): EF 60-65%.  GRII DD.  Mod LA dilation & nl RA.  Mod MAC.  No  AI (no-PVL), mean AV gradient 13 mmHg.;; b) (2nd post TAVR) EF 60 to 65%.  Severely increased thickness/LVH.  GRII DD with only mildly elevated left atrial pressure.  Normal RA size.  Severe MAC with only trace MR.  No MS.  TAVR valve prosthesis functioning well.  Trivial AI.  Mean gradient 15 mmHg with a peak 29.8 mmHg   TUBAL LIGATION     UPPER GI ENDOSCOPY     normal   WISDOM TOOTH EXTRACTION     Social History   Tobacco Use   Smoking status: Never    Passive exposure: Past   Smokeless tobacco: Never  Vaping Use   Vaping Use: Never used  Substance Use Topics   Alcohol use: No    Alcohol/week: 0.0 standard drinks of alcohol    Drug use: No   Family History  Problem Relation Age of Onset   Kidney disease Mother    Alzheimer's disease Mother    Stroke Father 55   Colon cancer Father 43       possibly colon cancer, possibly just polyps   Kidney disease Sister    Heart attack Sister    Colon cancer Paternal Aunt 76   Cancer Paternal Uncle        unk type   Esophageal cancer Neg Hx    Stomach cancer Neg Hx    Rectal cancer Neg Hx    Breast cancer Neg Hx    Ovarian cancer Neg Hx    Endometrial cancer Neg Hx    Pancreatic cancer Neg Hx    Prostate cancer Neg Hx    Allergies  Allergen Reactions   Clarithromycin Swelling and Other (See Comments)    Throat Swells   Penicillins Hives and Other (See Comments)    WELTS Has patient had a PCN reaction causing immediate rash, facial/tongue/throat swelling, SOB or lightheadedness  with hypotension: No Has patient had a PCN reaction causing severe rash involving mucus membranes or skin necrosis: Unknown Has patient had a PCN reaction that required hospitalization:No Has patient had a PCN reaction occurring within the last 10 years: No If all of the above answers are "NO", then may proceed with Cephalosporin use.    Other     Band-Aid, leaves mark on skin   Codeine Nausea And Vomiting   Current Outpatient Medications on File Prior to Visit  Medication Sig Dispense Refill   acetaminophen (TYLENOL) 500 MG tablet Take 1,000 mg by mouth every 6 (six) hours as needed for moderate pain.     acetic acid-hydrocortisone (VOSOL-HC) OTIC solution Place 2 drops into both ears 2 (two) times daily as needed (itching).     amLODipine (NORVASC) 10 MG tablet Take 1 tablet (10 mg total) by mouth daily. 90 tablet 3   Ascorbic Acid (VITAMIN C) 1000 MG tablet Take 1,000 mg by mouth daily with breakfast.     aspirin EC 81 MG tablet Take 1 tablet (81 mg total) by mouth 2 (two) times daily. To prevent blood clots for 30 days after surgery. 60 tablet 0   Black Pepper-Turmeric (TURMERIC  COMPLEX/BLACK PEPPER PO) Take 15 mLs by mouth daily with breakfast.      Calcium-Phosphorus-Vitamin D (CALCIUM/VITAMIN D3/ADULT GUMMY PO) Take 1 tablet by mouth 2 (two) times daily.      cetirizine (ZYRTEC) 10 MG tablet Take 10 mg by mouth daily.     cholecalciferol (VITAMIN D) 1000 UNITS tablet Take 1,000 Units by mouth daily with breakfast.      fluticasone (FLONASE) 50 MCG/ACT nasal spray Place 2 sprays into both nostrils at bedtime.      furosemide (LASIX) 20 MG tablet MAY TAKE 1 TABLET UP TO THREE TIMES A WEEK. DO NOT TAKE HCTZ THAT DAY. 30 tablet 11   hydrochlorothiazide (HYDRODIURIL) 25 MG tablet Take 1 tablet (25 mg total) by mouth daily with lunch. (Patient taking differently: Take 25 mg by mouth See admin instructions. Take 25 mg by mouth Sunday, Monday, Wednesday and Friday) 90 tablet 3   lubiprostone (AMITIZA) 24 MCG capsule Take 1 capsule (24 mcg total) by mouth 2 (two) times daily. LUNCH & SUPPER 180 capsule 3   meclizine (ANTIVERT) 25 MG tablet TAKE 1/2 TO 1 TABLET BY MOUTH 3 TIMES DAILY AS NEEDED FOR DIZZINESS. CAUTION OF SEDATION 20 tablet 0   meloxicam (MOBIC) 7.5 MG tablet TAKE 1 TABLET BY MOUTH AT BEDTIME. 90 tablet 0   metoprolol succinate (TOPROL-XL) 25 MG 24 hr tablet Take 1 tablet (25 mg total) by mouth daily. (Patient taking differently: Take 25 mg by mouth at bedtime.) 90 tablet 3   mupirocin ointment (BACTROBAN) 2 % Apply 1 Application topically 2 (two) times daily. To wound 22 g 0   NEXIUM 40 MG capsule Take 40 mg by mouth in the morning.     Polyethyl Glycol-Propyl Glycol 0.4-0.3 % SOLN Place 1-2 drops into both eyes at bedtime.     potassium chloride (KLOR-CON) 10 MEQ tablet TAKE 2 TABLETS (20 MEQ TOTAL) BY MOUTH 2 (TWO) TIMES DAILY. 360 tablet 0   sodium chloride (OCEAN) 0.65 % nasal spray Place 1 spray into the nose daily.     triamcinolone cream (KENALOG) 0.1 % Apply topically 2 (two) times daily. To affected area/ rash (Patient taking differently: Apply 1 Application  topically daily as needed (rash).) 30 g 0   venlafaxine XR (EFFEXOR XR) 75 MG  24 hr capsule Take 1 capsule (75 mg total) by mouth daily after breakfast. 90 capsule 3   No current facility-administered medications on file prior to visit.    Review of Systems  Constitutional:  Negative for activity change, appetite change, fatigue, fever and unexpected weight change.  HENT:  Negative for congestion, ear pain, rhinorrhea, sinus pressure and sore throat.   Eyes:  Negative for pain, redness and visual disturbance.  Respiratory:  Negative for cough, shortness of breath and wheezing.   Cardiovascular:  Negative for chest pain and palpitations.  Gastrointestinal:  Negative for abdominal pain, blood in stool, constipation and diarrhea.  Endocrine: Negative for polydipsia and polyuria.  Genitourinary:  Negative for dysuria, frequency and urgency.  Musculoskeletal:  Positive for arthralgias and back pain. Negative for myalgias.  Skin:  Negative for pallor and rash.       Tick bite R upper arm  Allergic/Immunologic: Negative for environmental allergies.  Neurological:  Negative for dizziness, syncope and headaches.  Hematological:  Negative for adenopathy. Does not bruise/bleed easily.  Psychiatric/Behavioral:  Negative for decreased concentration and dysphoric mood. The patient is not nervous/anxious.        Objective:   Physical Exam Constitutional:      General: She is not in acute distress.    Appearance: Normal appearance. She is well-developed and normal weight. She is not ill-appearing or diaphoretic.  HENT:     Head: Normocephalic and atraumatic.     Right Ear: Tympanic membrane, ear canal and external ear normal.     Left Ear: Tympanic membrane, ear canal and external ear normal.     Nose: Nose normal. No congestion.     Mouth/Throat:     Mouth: Mucous membranes are moist.     Pharynx: Oropharynx is clear. No posterior oropharyngeal erythema.  Eyes:     General: No scleral  icterus.    Extraocular Movements: Extraocular movements intact.     Conjunctiva/sclera: Conjunctivae normal.     Pupils: Pupils are equal, round, and reactive to light.  Neck:     Thyroid: No thyromegaly.     Vascular: No carotid bruit or JVD.  Cardiovascular:     Rate and Rhythm: Normal rate and regular rhythm.     Pulses: Normal pulses.     Heart sounds: Normal heart sounds.     No gallop.  Pulmonary:     Effort: Pulmonary effort is normal. No respiratory distress.     Breath sounds: Normal breath sounds. No wheezing.     Comments: Good air exch Chest:     Chest wall: No tenderness.  Abdominal:     General: Bowel sounds are normal. There is no distension or abdominal bruit.     Palpations: Abdomen is soft. There is no mass.     Tenderness: There is no abdominal tenderness.     Hernia: No hernia is present.  Genitourinary:    Comments: Breast exam: No mass, nodules, thickening, tenderness, bulging, retraction, inflamation, nipple discharge or skin changes noted.  No axillary or clavicular LA.     Musculoskeletal:        General: No tenderness. Normal range of motion.     Cervical back: Normal range of motion and neck supple. No rigidity. No muscular tenderness.     Right lower leg: No edema.     Left lower leg: No edema.     Comments: No kyphosis   Lymphadenopathy:     Cervical: No cervical adenopathy.  Skin:  General: Skin is warm and dry.     Coloration: Skin is not pale.     Findings: No erythema or rash.     Comments: 3 by 4 cm oval area of erythema and induration with scant central clearing R upper arm  No tick seen   No rash   Neurological:     Mental Status: She is alert. Mental status is at baseline.     Cranial Nerves: No cranial nerve deficit.     Motor: No abnormal muscle tone.     Coordination: Coordination normal.     Gait: Gait normal.     Deep Tendon Reflexes: Reflexes are normal and symmetric.  Psychiatric:        Mood and Affect: Mood normal.         Cognition and Memory: Cognition and memory normal.           Assessment & Plan:   Problem List Items Addressed This Visit       Cardiovascular and Mediastinum   Essential hypertension (Chronic)    Bp is higher than usual today  BP: 122/64  Is more anxious   Will continue current medicines  Inst to hold meloxicam for nowbp in fair control at this time  BP Readings from Last 1 Encounters:  12/11/22 122/64   Amlodipine 10   Hctz 25 mg 4 times weekly  Toprol xl 25 mg  Furosemide 20 mg three days weekly        Digestive   GERD    Takes nexium from ENT         Musculoskeletal and Integument   Tick bite of right upper arm    Large area of erythema with scant central clearing  From deer tick/ 2 wk ago   Given appearance will treat with doxycycline Instructed pt to watch for fever /rash or other symptoms  Update if not starting to improve in a week or if worsening        Osteopenia    Last dexa was improved/normal 12/2020  On vit D High fall risk/ no fractures  Discussed fall prevention Discussed strength building exercise         Other   Hyperlipidemia with target LDL less than 100 (Chronic)    Disc goals for lipids and reasons to control them Rev last labs with pt Rev low sat fat diet in detail Stable HDL over 50 and LDL under 409 Continue to manage with diet       Personal history of colonic polyps    Due for 3 y colonoscopy  Ref done  Pt will call to schedule      Relevant Orders   Ambulatory referral to Gastroenterology   Encounter for general adult medical examination with abnormal findings - Primary    Reviewed health habits including diet and exercise and skin cancer prevention Reviewed appropriate screening tests for age  Also reviewed health mt list, fam hx and immunization status , as well as social and family history   See HPI Labs reviewed and ordered Pt plans to get shingrix at pharmacy  Colonoscopy ref done  Mammogram utd  06/2022  Dexa utd / taking vit D Discussed fall prevention  PHQ is stable  New problem- tick bite on R arm / treated       Diabetes mellitus screening    Lab Results  Component Value Date   HGBA1C 5.0 11/27/2022  disc imp of low glycemic diet and wt loss to prevent  DM2  Commended wt loss so far      Anxiety and depression    Stable  PHQ score of 0  Continues effexor xr which has helped a lot

## 2022-12-11 NOTE — Assessment & Plan Note (Signed)
Large area of erythema with scant central clearing  From deer tick/ 2 wk ago   Given appearance will treat with doxycycline Instructed pt to watch for fever /rash or other symptoms  Update if not starting to improve in a week or if worsening

## 2022-12-11 NOTE — Assessment & Plan Note (Signed)
Due for 3 y colonoscopy  Ref done  Pt will call to schedule

## 2022-12-11 NOTE — Assessment & Plan Note (Signed)
Takes nexium from ENT

## 2022-12-11 NOTE — Assessment & Plan Note (Signed)
Stable  PHQ score of 0  Continues effexor xr which has helped a lot

## 2022-12-11 NOTE — Assessment & Plan Note (Signed)
Last dexa was improved/normal 12/2020  On vit D High fall risk/ no fractures  Discussed fall prevention Discussed strength building exercise

## 2022-12-18 ENCOUNTER — Telehealth: Payer: Self-pay | Admitting: *Deleted

## 2022-12-18 ENCOUNTER — Ambulatory Visit (INDEPENDENT_AMBULATORY_CARE_PROVIDER_SITE_OTHER): Payer: PPO

## 2022-12-18 VITALS — Ht 64.0 in | Wt 168.0 lb

## 2022-12-18 DIAGNOSIS — Z780 Asymptomatic menopausal state: Secondary | ICD-10-CM | POA: Diagnosis not present

## 2022-12-18 DIAGNOSIS — Z1231 Encounter for screening mammogram for malignant neoplasm of breast: Secondary | ICD-10-CM

## 2022-12-18 DIAGNOSIS — Z0000 Encounter for general adult medical examination without abnormal findings: Secondary | ICD-10-CM

## 2022-12-18 DIAGNOSIS — Z1211 Encounter for screening for malignant neoplasm of colon: Secondary | ICD-10-CM

## 2022-12-18 NOTE — Telephone Encounter (Signed)
Talbert Forest from radiation called and scheduled the patient for a follow up appt with  Dr Pricilla Holm on 8/15 at 2:30 pm.  Appt scheduled and Talbert Forest will contact the patient with the appt date/time.

## 2022-12-18 NOTE — Patient Instructions (Signed)
Veronica Greene , Thank you for taking time to come for your Medicare Wellness Visit. I appreciate your ongoing commitment to your health goals. Please review the following plan we discussed and let me know if I can assist you in the future.   These are the goals we discussed:  Goals      DIET - EAT MORE FRUITS AND VEGETABLES     DIET - INCREASE WATER INTAKE     Starting 11/07/2017, I will continue to drink 3-4 12 oz bottles of water daily.      Patient Stated     11/24/2019, I will maintain and continue medications as prescribed.      Patient Stated     12/14/2020, I will continue to ride my stationary bike everyday for about 20 minutes.     Patient Stated     No new goals.        This is a list of the screening recommended for you and due dates:  Health Maintenance  Topic Date Due   Zoster (Shingles) Vaccine (1 of 2) Never done   COVID-19 Vaccine (7 - 2023-24 season) 03/09/2022   Colon Cancer Screening  11/10/2022   Flu Shot  02/07/2023   Mammogram  07/05/2023   Medicare Annual Wellness Visit  12/18/2023   DTaP/Tdap/Td vaccine (4 - Td or Tdap) 06/18/2028   Pneumonia Vaccine  Completed   DEXA scan (bone density measurement)  Completed   Hepatitis C Screening  Completed   HPV Vaccine  Aged Out    Advanced directives: Please bring a copy of your health care power of attorney and living will to the office to be added to your chart at your convenience.   Conditions/risks identified: Aim for 30 minutes of exercise or brisk walking, 6-8 glasses of water, and 5 servings of fruits and vegetables each day.   Next appointment: Follow up in one year for your annual wellness visit 12/19/23 @ 10:45 telephone   Preventive Care 65 Years and Older, Female Preventive care refers to lifestyle choices and visits with your health care provider that can promote health and wellness. What does preventive care include? A yearly physical exam. This is also called an annual well check. Dental exams  once or twice a year. Routine eye exams. Ask your health care provider how often you should have your eyes checked. Personal lifestyle choices, including: Daily care of your teeth and gums. Regular physical activity. Eating a healthy diet. Avoiding tobacco and drug use. Limiting alcohol use. Practicing safe sex. Taking low-dose aspirin every day. Taking vitamin and mineral supplements as recommended by your health care provider. What happens during an annual well check? The services and screenings done by your health care provider during your annual well check will depend on your age, overall health, lifestyle risk factors, and family history of disease. Counseling  Your health care provider may ask you questions about your: Alcohol use. Tobacco use. Drug use. Emotional well-being. Home and relationship well-being. Sexual activity. Eating habits. History of falls. Memory and ability to understand (cognition). Work and work Astronomer. Reproductive health. Screening  You may have the following tests or measurements: Height, weight, and BMI. Blood pressure. Lipid and cholesterol levels. These may be checked every 5 years, or more frequently if you are over 31 years old. Skin check. Lung cancer screening. You may have this screening every year starting at age 30 if you have a 30-pack-year history of smoking and currently smoke or have quit within the  past 15 years. Fecal occult blood test (FOBT) of the stool. You may have this test every year starting at age 68. Flexible sigmoidoscopy or colonoscopy. You may have a sigmoidoscopy every 5 years or a colonoscopy every 10 years starting at age 16. Hepatitis C blood test. Hepatitis B blood test. Sexually transmitted disease (STD) testing. Diabetes screening. This is done by checking your blood sugar (glucose) after you have not eaten for a while (fasting). You may have this done every 1-3 years. Bone density scan. This is done to  screen for osteoporosis. You may have this done starting at age 32. Mammogram. This may be done every 1-2 years. Talk to your health care provider about how often you should have regular mammograms. Talk with your health care provider about your test results, treatment options, and if necessary, the need for more tests. Vaccines  Your health care provider may recommend certain vaccines, such as: Influenza vaccine. This is recommended every year. Tetanus, diphtheria, and acellular pertussis (Tdap, Td) vaccine. You may need a Td booster every 10 years. Zoster vaccine. You may need this after age 78. Pneumococcal 13-valent conjugate (PCV13) vaccine. One dose is recommended after age 50. Pneumococcal polysaccharide (PPSV23) vaccine. One dose is recommended after age 45. Talk to your health care provider about which screenings and vaccines you need and how often you need them. This information is not intended to replace advice given to you by your health care provider. Make sure you discuss any questions you have with your health care provider. Document Released: 07/22/2015 Document Revised: 03/14/2016 Document Reviewed: 04/26/2015 Elsevier Interactive Patient Education  2017 [employer].  Fall Prevention in the Home Falls can cause injuries. They can happen to people of all ages. There are many things you can do to make your home safe and to help prevent falls. What can I do on the outside of my home? Regularly fix the edges of walkways and driveways and fix any cracks. Remove anything that might make you trip as you walk through a door, such as a raised step or threshold. Trim any bushes or trees on the path to your home. Use bright outdoor lighting. Clear any walking paths of anything that might make someone trip, such as rocks or tools. Regularly check to see if handrails are loose or broken. Make sure that both sides of any steps have handrails. Any raised decks and porches should have  guardrails on the edges. Have any leaves, snow, or ice cleared regularly. Use sand or salt on walking paths during winter. Clean up any spills in your garage right away. This includes oil or grease spills. What can I do in the bathroom? Use night lights. Install grab bars by the toilet and in the tub and shower. Do not use towel bars as grab bars. Use non-skid mats or decals in the tub or shower. If you need to sit down in the shower, use a plastic, non-slip stool. Keep the floor dry. Clean up any water that spills on the floor as soon as it happens. Remove soap buildup in the tub or shower regularly. Attach bath mats securely with double-sided non-slip rug tape. Do not have throw rugs and other things on the floor that can make you trip. What can I do in the bedroom? Use night lights. Make sure that you have a light by your bed that is easy to reach. Do not use any sheets or blankets that are too big for your bed. They should not  hang down onto the floor. Have a firm chair that has side arms. You can use this for support while you get dressed. Do not have throw rugs and other things on the floor that can make you trip. What can I do in the kitchen? Clean up any spills right away. Avoid walking on wet floors. Keep items that you use a lot in easy-to-reach places. If you need to reach something above you, use a strong step stool that has a grab bar. Keep electrical cords out of the way. Do not use floor polish or wax that makes floors slippery. If you must use wax, use non-skid floor wax. Do not have throw rugs and other things on the floor that can make you trip. What can I do with my stairs? Do not leave any items on the stairs. Make sure that there are handrails on both sides of the stairs and use them. Fix handrails that are broken or loose. Make sure that handrails are as long as the stairways. Check any carpeting to make sure that it is firmly attached to the stairs. Fix any carpet  that is loose or worn. Avoid having throw rugs at the top or bottom of the stairs. If you do have throw rugs, attach them to the floor with carpet tape. Make sure that you have a light switch at the top of the stairs and the bottom of the stairs. If you do not have them, ask someone to add them for you. What else can I do to help prevent falls? Wear shoes that: Do not have high heels. Have rubber bottoms. Are comfortable and fit you well. Are closed at the toe. Do not wear sandals. If you use a stepladder: Make sure that it is fully opened. Do not climb a closed stepladder. Make sure that both sides of the stepladder are locked into place. Ask someone to hold it for you, if possible. Clearly mark and make sure that you can see: Any grab bars or handrails. First and last steps. Where the edge of each step is. Use tools that help you move around (mobility aids) if they are needed. These include: Canes. Walkers. Scooters. Crutches. Turn on the lights when you go into a dark area. Replace any light bulbs as soon as they burn out. Set up your furniture so you have a clear path. Avoid moving your furniture around. If any of your floors are uneven, fix them. If there are any pets around you, be aware of where they are. Review your medicines with your doctor. Some medicines can make you feel dizzy. This can increase your chance of falling. Ask your doctor what other things that you can do to help prevent falls. This information is not intended to replace advice given to you by your health care provider. Make sure you discuss any questions you have with your health care provider. Document Released: 04/21/2009 Document Revised: 12/01/2015 Document Reviewed: 07/30/2014 Elsevier Interactive Patient Education  2017 [employer].

## 2022-12-18 NOTE — Progress Notes (Signed)
I connected with  Carlisle Cater on 12/18/22 by a audio enabled telemedicine application and verified that I am speaking with the correct person using two identifiers.  Patient Location: Home  Provider Location: Home Office  I discussed the limitations of evaluation and management by telemedicine. The patient expressed understanding and agreed to proceed.  Subjective:   Veronica Greene is a 77 y.o. female who presents for Medicare Annual (Subsequent) preventive examination.  Review of Systems      Cardiac Risk Factors include: advanced age (>70men, >36 women);hypertension;sedentary lifestyle     Objective:    Today's Vitals   12/18/22 1048  Weight: 168 lb (76.2 kg)  Height: 5\' 4"  (1.626 m)   Body mass index is 28.84 kg/m.     12/18/2022   11:06 AM 08/09/2022    3:02 PM 05/22/2022    8:52 PM 05/16/2022    9:36 AM 01/18/2022    9:08 AM 12/15/2021   10:29 AM 07/20/2021    1:53 PM  Advanced Directives  Does Patient Have a Medical Advance Directive? Yes Yes Yes Yes Yes No Yes  Type of Estate agent of [place];Living will  Living will;Healthcare Power of Attorney Living will;Healthcare Power of [employer] Power of [place];Living will    Does patient want to make changes to medical advance directive?  No - Patient declined No - Patient declined  No - Patient declined  No - Patient declined  Copy of Healthcare Power of Attorney in Chart? No - copy requested  Yes - validated most recent copy scanned in chart (See row information)      Would patient like information on creating a medical advance directive?      No - Patient declined     Current Medications (verified) Outpatient Encounter Medications as of 12/18/2022  Medication Sig   acetaminophen (TYLENOL) 500 MG tablet Take 1,000 mg by mouth every 6 (six) hours as needed for moderate pain.   amLODipine (NORVASC) 10 MG tablet Take 1 tablet (10 mg total) by mouth daily.   Ascorbic Acid (VITAMIN  C) 1000 MG tablet Take 1,000 mg by mouth daily with breakfast.   aspirin EC 81 MG tablet Take 1 tablet (81 mg total) by mouth 2 (two) times daily. To prevent blood clots for 30 days after surgery.   Black Pepper-Turmeric (TURMERIC COMPLEX/BLACK PEPPER PO) Take 15 mLs by mouth daily with breakfast.    Calcium-Phosphorus-Vitamin D (CALCIUM/VITAMIN D3/ADULT GUMMY PO) Take 1 tablet by mouth 2 (two) times daily.    cetirizine (ZYRTEC) 10 MG tablet Take 10 mg by mouth daily.   cholecalciferol (VITAMIN D) 1000 UNITS tablet Take 1,000 Units by mouth daily with breakfast.    fluticasone (FLONASE) 50 MCG/ACT nasal spray Place 2 sprays into both nostrils at bedtime.    furosemide (LASIX) 20 MG tablet MAY TAKE 1 TABLET UP TO THREE TIMES A WEEK. DO NOT TAKE HCTZ THAT DAY.   hydrochlorothiazide (HYDRODIURIL) 25 MG tablet Take 1 tablet (25 mg total) by mouth daily with lunch. (Patient taking differently: Take 25 mg by mouth See admin instructions. Take 25 mg by mouth Sunday, Monday, Wednesday and Friday)   lubiprostone (AMITIZA) 24 MCG capsule Take 1 capsule (24 mcg total) by mouth 2 (two) times daily. LUNCH & SUPPER   meclizine (ANTIVERT) 25 MG tablet TAKE 1/2 TO 1 TABLET BY MOUTH 3 TIMES DAILY AS NEEDED FOR DIZZINESS. CAUTION OF SEDATION   meloxicam (MOBIC) 7.5 MG tablet TAKE 1 TABLET BY MOUTH  AT BEDTIME.   metoprolol succinate (TOPROL-XL) 25 MG 24 hr tablet Take 1 tablet (25 mg total) by mouth daily. (Patient taking differently: Take 25 mg by mouth at bedtime.)   NEXIUM 40 MG capsule Take 40 mg by mouth in the morning.   Polyethyl Glycol-Propyl Glycol 0.4-0.3 % SOLN Place 1-2 drops into both eyes at bedtime.   potassium chloride (KLOR-CON) 10 MEQ tablet TAKE 2 TABLETS (20 MEQ TOTAL) BY MOUTH 2 (TWO) TIMES DAILY.   sodium chloride (OCEAN) 0.65 % nasal spray Place 1 spray into the nose daily.   venlafaxine XR (EFFEXOR XR) 75 MG 24 hr capsule Take 1 capsule (75 mg total) by mouth daily after breakfast.   acetic  acid-hydrocortisone (VOSOL-HC) OTIC solution Place 2 drops into both ears 2 (two) times daily as needed (itching). (Patient not taking: Reported on 12/18/2022)   doxycycline (VIBRA-TABS) 100 MG tablet Take 1 tablet (100 mg total) by mouth 2 (two) times daily. (Patient not taking: Reported on 12/18/2022)   mupirocin ointment (BACTROBAN) 2 % Apply 1 Application topically 2 (two) times daily. To wound (Patient not taking: Reported on 12/18/2022)   triamcinolone cream (KENALOG) 0.1 % Apply topically 2 (two) times daily. To affected area/ rash (Patient not taking: Reported on 12/18/2022)   No facility-administered encounter medications on file as of 12/18/2022.    Allergies (verified) Clarithromycin, Penicillins, Other, and Codeine   History: Past Medical History:  Diagnosis Date   Anxiety and depression 08/26/2007   Qualifier: Diagnosis of  By: Milinda Antis MD, Colon Flattery    Arthritis    knees   Constipation, slow transit 11/03/2010   Degenerative disk disease 08/31/2011   Depression with anxiety    DERMATITIS, SEBORRHEIC 08/25/2010   Qualifier: Diagnosis of  By: Milinda Antis MD, Colon Flattery    Endometrial cancer [facility])    02/ 2019  Diagnosed with D&C/hysteroscopy with polypectomy   Essential hypertension 08/29/2017   Fibromyalgia    tx mobic   Full dentures    GERD 08/26/2007   Qualifier: Diagnosis of  By: Milinda Antis MD, Colon Flattery    Gout    Hearing loss    wears bilateral hearing aids   History of colon polyps    History of nonmelanoma skin cancer    right lower leg   History of poliomyelitis 08/26/2007   Qualifier: History of  By: Milinda Antis MD, Colon Flattery    History of radiation therapy    Endometium- 11/20/17-12/11/17- Vag Cuff -Dr. Antony Blackbird   Hyperlipidemia, mild 09/02/2012   Hypertension    Hypokalemia 01/02/2011   IBS (irritable bowel syndrome)    with constipation   Obesity 08/26/2007   Qualifier: Diagnosis of  By: Milinda Antis MD, Colon Flattery    OSTEOARTHRITIS, HANDS, BILATERAL 08/26/2007    Qualifier: Diagnosis of  By: Milinda Antis MD, Colon Flattery    Osteopenia    OVERACTIVE BLADDER 08/26/2007   Qualifier: Diagnosis of  By: Milinda Antis MD, Colon Flattery    Personal history of colonic polyps 11/03/2010   05/2018 13 polyps mostly adenomas, ssp's - recall 1 year 2020 Iva Boop, MD, [facility]    Poor balance 11/25/2018   With falls  Has rise in R shoe/post polio  Weak legs as well     S/P TAVR (transcatheter aortic valve replacement)    s/p TAVR with a 23mm Edwards S3U via the TF approach by Dr. Laneta Simmers and Dr. Clifton James   Severe calcific aortic stenosis 08/27/2017    -> Became severe as of October  2020-referred for TAVR on 04/28/2019   URINARY INCONTINENCE 08/26/2007   Qualifier: Diagnosis of  By: Milinda Antis MD, Colon Flattery    Wears hearing aid in both ears    Past Surgical History:  Procedure Laterality Date   ANKLE SURGERY Right 1956   CARPAL TUNNEL RELEASE Bilateral 1986, 1993   CATARACT EXTRACTION Right 11/16/2019   COLONOSCOPY N/A 06/24/2015   Procedure: COLONOSCOPY;  Surgeon: Iva Boop, MD;  Location: [facility];  Service: Endoscopy;  Laterality: N/A;   DILATATION & CURETTAGE/HYSTEROSCOPY WITH MYOSURE N/A 08/09/2017   Procedure: DILATATION & CURETTAGE/HYSTEROSCOPY WITH MYOSURE;  Surgeon: Gerald Leitz, MD;  Location: [facility];  Service: Gynecology;  Laterality: N/A;  Polypectomy   DILATION AND CURETTAGE OF UTERUS     PMB   HAND SURGERY Bilateral 1999   Thumbs    KNEE SURGERY Bilateral 1998   x2   LYMPH NODE BIOPSY N/A 10/08/2017   Procedure: LYMPH NODE BIOPSY;  Surgeon: Adolphus Birchwood, MD;  Location: [facility];  Service: Gynecology;  Laterality: N/A;   MULTIPLE TOOTH EXTRACTIONS     NASAL SINUS SURGERY  1976   polio surgery.     right foot - right leg 1.5" shorter than left leg - some wekness   RIGHT/LEFT HEART CATH AND CORONARY ANGIOGRAPHY N/A 04/09/2019   Procedure: RIGHT/LEFT HEART CATH AND CORONARY ANGIOGRAPHY;  Surgeon: Marykay Lex, MD;  Location: [facility] INVASIVE CV LAB;  Service:  Cardiovascular;; Severe AS, mean gradient 50 mmHg P-P 55 mmHg (estimated AVA 0.76 cm).  Mildly elevated pulmonary pressures with elevated LVEDP and PCWP.  Angiographically normal coronary arteries, but tortuous.   ROBOTIC ASSISTED TOTAL HYSTERECTOMY WITH BILATERAL SALPINGO OOPHERECTOMY Bilateral 10/08/2017   Procedure: XI ROBOTIC ASSISTED TOTAL HYSTERECTOMY WITH BILATERAL SALPINGO OOPHORECTOMY;  Surgeon: Adolphus Birchwood, MD;  Location: [facility];  Service: Gynecology;  Laterality: Bilateral;   TEE WITHOUT CARDIOVERSION N/A 04/28/2019   Procedure: TRANSESOPHAGEAL ECHOCARDIOGRAM (TEE);  Surgeon: Kathleene Hazel, MD;  [facility] INV[facility] CV LAB;  Service: Open Heart Surgery;  Laterality: N/A;   THUMB ARTHROSCOPY  1999   TONSILLECTOMY  [address]   77 years old   TOTAL KNEE ARTHROPLASTY Right 05/22/2022   Procedure: TOTAL KNEE ARTHROPLASTY;  Surgeon: Sheral Apley, MD;  Location: [facility];  Service: Orthopedics;  Laterality: Right;   TRANSCATHETER AORTIC VALVE REPLACEMENT, TRANSFEMORAL N/A 04/28/2019   Procedure: TRANSCATHETER AORTIC VALVE REPLACEMENT, TRANSFEMORAL;  Surgeon: Kathleene Hazel, MD;  Location: [facility] INVASIVE CV LAB; ; 23 mm Sapien 3 Ultra THV via TF approach. (post-op mean AV gradient 13 mmHg)   TRANSTHORACIC ECHOCARDIOGRAM  05/15/2021   Normal/stable LV size and function.  EF 65 to 70%.  No RWMA.  GR 2 DD.  Moderate LA dilation.  Normal RV with mild RA dilation, normal RAP.Marland Kitchen  Severe MAC with no MR.  23 mm SAPIEN prosthetic TAVR valve well-seated.  Mean AVG 18 mm..  Trivial AI.  (No significant change from 05/04/2020-mean AVG 14 mmHg).   TRANSTHORACIC ECHOCARDIOGRAM  02/17/2019   Mod LVH. GR 2 DD. Severe AS (mean gradient = 44 mmHg with Mod AI. mild MS.   TRANSTHORACIC ECHOCARDIOGRAM  04/29/2019; 05/2020   a)  (post TAVR): EF 60-65%.  GRII DD.  Mod LA dilation & nl RA.  Mod MAC.  No  AI (no-PVL), mean AV gradient 13 mmHg.;; b) (2nd post TAVR) EF 60 to 65%.  Severely increased  thickness/LVH.  GRII DD with only mildly elevated left atrial pressure.  Normal RA size.  Severe MAC with only trace MR.  No MS.  TAVR valve prosthesis functioning well.  Trivial AI.  Mean gradient 15 mmHg with a peak 29.8 mmHg   TUBAL LIGATION     UPPER GI ENDOSCOPY     normal   WISDOM TOOTH EXTRACTION     Family History  Problem Relation Age of Onset   Kidney disease Mother    Alzheimer's disease Mother    Stroke Father 39   Colon cancer Father 23       possibly colon cancer, possibly just polyps   Kidney disease Sister    Heart attack Sister    Colon cancer Paternal Aunt 13   Cancer Paternal Uncle        unk type   Esophageal cancer Neg Hx    Stomach cancer Neg Hx    Rectal cancer Neg Hx    Breast cancer Neg Hx    Ovarian cancer Neg Hx    Endometrial cancer Neg Hx    Pancreatic cancer Neg Hx    Prostate cancer Neg Hx    Social History   Socioeconomic History   Marital status: Widowed    Spouse name: Not on file   Number of children: 2   Years of education: Not on file   Highest education level: High school graduate  Occupational History   Occupation: RECEPTION    Employer: [employer]    Comment: Retired; Theme park manager reception  Tobacco Use   Smoking status: Never    Passive exposure: Past   Smokeless tobacco: Never  Vaping Use   Vaping Use: Never used  Substance and Sexual Activity   Alcohol use: No    Alcohol/week: 0.0 standard drinks of alcohol   Drug use: No   Sexual activity: Yes    Birth control/protection: Post-menopausal  Other Topics Concern   Not on file  Social History Narrative   She is relatively recently remarried.  She has 2  children and 3 grandchildren.     She is currently retired.  But enjoys cleaning house and doing chores.  She likes to do yard work.  Does not routinely exercise.   Social Determinants of Health   Financial Resource Strain: Low Risk  (12/18/2022)   Overall Financial Resource Strain (CARDIA)    Difficulty of Paying  Living Expenses: Not hard at all  Food Insecurity: No Food Insecurity (12/18/2022)   Hunger Vital Sign    Worried About Running Out of Food in the Last Year: Never true    Ran Out of Food in the Last Year: Never true  Transportation Needs: No Transportation Needs (12/18/2022)   PRAPARE - Administrator, Civil Service (Medical): No    Lack of Transportation (Non-Medical): No  Physical Activity: Inactive (12/18/2022)   Exercise Vital Sign    Days of Exercise per Week: 0 days    Minutes of Exercise per Session: 0 min  Stress: No Stress Concern Present (12/18/2022)   [employer] of Occupational Health - Occupational Stress Questionnaire    Feeling of Stress : Not at all  Social Connections: Moderately Integrated (12/18/2022)   Social Connection and Isolation Panel [NHANES]    Frequency of Communication with Friends and Family: More than three times a week    Frequency of Social Gatherings with Friends and Family: More than three times a week    Attends Religious Services: 1 to 4 times per year    Active Member of Clubs or Organizations: No  Attends Banker Meetings: Never    Marital Status: Married    Tobacco Counseling Counseling given: Not Answered   Clinical Intake:  Pre-visit preparation completed: Yes  Pain : No/denies pain     Nutritional Risks: None Diabetes: No  How often do you need to have someone help you when you read instructions, pamphlets, or other written materials from your doctor or pharmacy?: 1 - Never  Diabetic? no  Interpreter Needed?: No  Information entered by :: C.Taj Arteaga LPN   Activities of Daily Living    12/18/2022   11:07 AM 05/22/2022    8:52 PM  In your present state of health, do you have any difficulty performing the following activities:  Hearing? 0 0  Vision? 0 0  Difficulty concentrating or making decisions? 1 0  Comment issues with recall   Walking or climbing stairs? 0 1  Dressing or bathing? 0 0   Doing errands, shopping? 0 0  Preparing Food and eating ? N   Using the Toilet? N   In the past six months, have you accidently leaked urine? N   Do you have problems with loss of bowel control? N   Managing your Medications? N   Managing your Finances? N   Housekeeping or managing your Housekeeping? N     Patient Care Team: Tower, Audrie Gallus, MD as PCP - General Herbie Baltimore Piedad Climes, MD as PCP - Cardiology (Cardiology) Regal, Kirstie Peri, DPM as Consulting Physician (Podiatry) Loreta Ave, MD (Inactive) as Consulting Physician (Orthopedic Surgery) Osborn Coho, MD (Inactive) as Consulting Physician (Otolaryngology) Catha Brow, DDS as Consulting Physician (Dentistry) Fredrich Birks, OD as Referring Physician (Optometry)  Indicate any recent Medical Services you may have received from other than [facility] providers in the past year (date may be approximate).     Assessment:   This is a routine wellness examination for [place].  Hearing/Vision screen Hearing Screening - Comments:: Aids Vision Screening - Comments:: Readers- Dr.Scott  Dietary issues and exercise activities discussed: Current Exercise Habits: The patient does not participate in regular exercise at present, Exercise limited by: orthopedic condition(s) (knee pain)   Goals Addressed             This Visit's Progress    Patient Stated       No new goals.       Depression Screen    12/18/2022   11:05 AM 12/11/2022   10:07 AM 12/15/2021   10:26 AM 12/14/2020    2:19 PM 11/29/2020    9:40 AM 11/24/2019    9:07 AM 11/20/2018    8:41 AM  PHQ 2/9 Scores  PHQ - 2 Score 0 1 0 4 1 1  0  PHQ- 9 Score   0 4 3 1  0    Fall Risk    12/18/2022   11:06 AM 12/11/2022   10:06 AM 12/15/2021   10:30 AM 12/14/2020    2:05 PM 11/29/2020    9:38 AM  Fall Risk   Falls in the past year? 1 1 1 1 1   Number falls in past yr: 0 0 0 0 1  Injury with Fall? 0 0 1 0 0  Comment Brused hip      Risk for fall due to : Impaired balance/gait Impaired  mobility History of fall(s) Medication side effect   Follow up Falls prevention discussed;Falls evaluation completed Falls evaluation completed Falls evaluation completed;Falls prevention discussed Falls evaluation completed;Falls prevention discussed     FALL RISK PREVENTION  PERTAINING TO THE HOME:  Any stairs in or around the home? Yes  If so, are there any without handrails? No  Home free of loose throw rugs in walkways, pet beds, electrical cords, etc? Yes  Adequate lighting in your home to reduce risk of falls? Yes   ASSISTIVE DEVICES UTILIZED TO PREVENT FALLS:  Life alert? No  Use of a cane, walker or w/c? Yes  Grab bars in the bathroom? Yes  Shower chair or bench in shower? Yes  Elevated toilet seat or a handicapped toilet? Yes    Cognitive Function:    12/14/2020    2:21 PM 11/24/2019    9:14 AM 11/20/2018    8:41 AM 11/07/2017    8:28 AM 11/02/2016   10:15 AM  MMSE - Mini Mental State Exam  Orientation to time 5 5 5 5 5   Orientation to Place 5 5 5 5 5   Registration 3 3 3 3 3   Attention/ Calculation 5 5 0 0 0  Recall 3 3 2 3 3   Recall-comments   unable to recall 1 of 3 words    Language- name 2 objects   0 0 0  Language- repeat 1 1 1 1 1   Language- follow 3 step command   0 3 3  Language- read & follow direction   0 0 0  Write a sentence   0 0 0  Copy design   0 0 0  Total score   16 20 20         12/18/2022   11:08 AM 12/15/2021   10:33 AM  6CIT Screen  What Year? 0 points 0 points  What month? 0 points 0 points  What time? 0 points 0 points  Count back from 20 0 points 0 points  Months in reverse 0 points 0 points  Repeat phrase 0 points 0 points  Total Score 0 points 0 points    Immunizations Immunization History  Administered Date(s) Administered   Fluad Quad(high Dose 65+) 05/06/2019, 05/04/2020, 03/30/2022   Influenza Split 04/08/2008, 06/30/2009   Influenza Whole 04/08/2008, 06/30/2009   Influenza, High Dose Seasonal PF 05/17/2015, 05/07/2016,  05/07/2017, 04/22/2018, 05/06/2019, 05/04/2020, 05/04/2021   Influenza-Unspecified 05/09/2014, 05/17/2015, 05/07/2016, 05/07/2017, 04/22/2018, 05/04/2021   PFIZER Comirnaty(Gray Top)Covid-19 Tri-Sucrose Vaccine 07/30/2019, 08/20/2019, 04/23/2020   PFIZER(Purple Top)SARS-COV-2 Vaccination 07/30/2019, 08/20/2019, 04/23/2020   Pneumococcal Conjugate-13 10/12/2014   Pneumococcal Polysaccharide-23 08/25/2010   Pneumococcal-Unspecified 08/25/2010   Td 10/15/2003   Td,absorbed, Preservative Free, Adult Use, Lf Unspecified 10/15/2003   Tdap 06/18/2018   Zoster, Live 08/12/2008    TDAP status: Up to date  Flu Vaccine status: Up to date  Pneumococcal vaccine status: Up to date  Covid-19 vaccine status: Declined, Education has been provided regarding the importance of this vaccine but patient still declined. Advised may receive this vaccine at local pharmacy or Health Dept.or vaccine clinic. Aware to provide a copy of the vaccination record if obtained from local pharmacy or Health Dept. Verbalized acceptance and understanding.  Qualifies for Shingles Vaccine? Yes   Zostavax completed No   Shingrix Completed?: No.    Education has been provided regarding the importance of this vaccine. Patient has been advised to call insurance company to determine out of pocket expense if they have not yet received this vaccine. Advised may also receive vaccine at local pharmacy or Health Dept. Verbalized acceptance and understanding.  Screening Tests Health Maintenance  Topic Date Due   Zoster Vaccines- Shingrix (1 of 2) Never done  COVID-19 Vaccine (7 - 2023-24 season) 03/09/2022   Colonoscopy  11/10/2022   INFLUENZA VACCINE  02/07/2023   MAMMOGRAM  07/05/2023   Medicare Annual Wellness (AWV)  12/18/2023   DTaP/Tdap/Td (4 - Td or Tdap) 06/18/2028   Pneumonia Vaccine 39+ Years old  Completed   DEXA SCAN  Completed   Hepatitis C Screening  Completed   HPV VACCINES  Aged Out    Health  Maintenance  Health Maintenance Due  Topic Date Due   Zoster Vaccines- Shingrix (1 of 2) Never done   COVID-19 Vaccine (7 - 2023-24 season) 03/09/2022   Colonoscopy  11/10/2022    Colorectal cancer screening: Type of screening: Colonoscopy. Completed 11/10/19. Repeat every 3 years  Mammogram status: Completed 07/04/22. Repeat every year  Bone Density status: Completed 12/21/20. Results reflect: Bone density results: OSTEOPENIA. Repeat every 02 years.  Lung Cancer Screening: (Low Dose CT Chest recommended if Age 66-80 years, 30 pack-year currently smoking OR have quit w/in 15years.) does not qualify.   Lung Cancer Screening Referral: no  Additional Screening:  Hepatitis C Screening: does qualify; Completed 10/08/16  Vision Screening: Recommended annual ophthalmology exams for early detection of glaucoma and other disorders of the eye. Is the patient up to date with their annual eye exam?  Yes  Who is the provider or what is the name of the office in which the patient attends annual eye exams? Dr.Scott If pt is not established with a provider, would they like to be referred to a provider to establish care? Yes .   Dental Screening: Recommended annual dental exams for proper oral hygiene  Community Resource Referral / Chronic Care Management: CRR required this visit?  No   CCM required this visit?  No      Plan:     I have personally reviewed and noted the following in the patient's chart:   Medical and social history Use of alcohol, tobacco or illicit drugs  Current medications and supplements including opioid prescriptions. Patient is not currently taking opioid prescriptions. Functional ability and status Nutritional status Physical activity Advanced directives List of other physicians Hospitalizations, surgeries, and ER visits in previous 12 months Vitals Screenings to include cognitive, depression, and falls Referrals and appointments  In addition, I have  reviewed and discussed with patient certain preventive protocols, quality metrics, and best practice recommendations. A written personalized care plan for preventive services as well as general preventive health recommendations were provided to patient.     Maryan Puls, LPN   1/61/0960   Nurse Notes: Order placed for colonoscopy, dexa, mammogram.

## 2022-12-26 ENCOUNTER — Ambulatory Visit
Admission: EM | Admit: 2022-12-26 | Discharge: 2022-12-26 | Disposition: A | Discharge status: Home / Self Care | Payer: PPO | Attending: Internal Medicine | Admitting: Internal Medicine

## 2022-12-26 DIAGNOSIS — S81811A Laceration without foreign body, right lower leg, initial encounter: Secondary | ICD-10-CM

## 2022-12-26 DIAGNOSIS — Z23 Encounter for immunization: Secondary | ICD-10-CM

## 2022-12-26 MED ORDER — DOXYCYCLINE HYCLATE 100 MG PO CAPS: 100.0000 mg | ORAL_CAPSULE | Freq: Two times a day (BID) | ORAL | 0 refills | Status: AC

## 2022-12-26 MED ORDER — TETANUS-DIPHTH-ACELL PERTUSSIS 5-2.5-18.5 LF-MCG/0.5 IM SUSY
0.5000 mL | PREFILLED_SYRINGE | Freq: Once | INTRAMUSCULAR | Status: AC
  Administered 2022-12-26: 0.5 mL via INTRAMUSCULAR

## 2022-12-26 NOTE — ED Provider Notes (Addendum)
[facility]    CSN: [id number] Arrival date & time: 12/26/22  1324      History   Chief Complaint Chief Complaint  Patient presents with   Laceration    HPI Veronica Greene is a 77 y.o. female.   Patient presents with a laceration to her right shin that occurred about 3 days ago around 7 PM.  Patient states that she was walking past the dishwasher when she scraped her leg on it.  She is able to bear weight and denies any pain other than mild pain associated with the laceration.  She is concerned that it could be infected so wanted it evaluated.  Denies any fever.  Patient does not take any blood thinning medications.  Patient is not sure of last tetanus vaccine.   Laceration   Past Medical History:  Diagnosis Date   Anxiety and depression 08/26/2007   Qualifier: Diagnosis of  By: Milinda Antis MD, Colon Flattery    Arthritis    knees   Constipation, slow transit 11/03/2010   Degenerative disk disease 08/31/2011   Depression with anxiety    DERMATITIS, SEBORRHEIC 08/25/2010   Qualifier: Diagnosis of  By: Milinda Antis MD, Colon Flattery    Endometrial cancer [facility])    02/ 2019  Diagnosed with D&C/hysteroscopy with polypectomy   Essential hypertension 08/29/2017   Fibromyalgia    tx mobic   Full dentures    GERD 08/26/2007   Qualifier: Diagnosis of  By: Milinda Antis MD, Colon Flattery    Gout    Hearing loss    wears bilateral hearing aids   History of colon polyps    History of nonmelanoma skin cancer    right lower leg   History of poliomyelitis 08/26/2007   Qualifier: History of  By: Milinda Antis MD, Colon Flattery    History of radiation therapy    Endometium- 11/20/17-12/11/17- Vag Cuff -Dr. Antony Blackbird   Hyperlipidemia, mild 09/02/2012   Hypertension    Hypokalemia 01/02/2011   IBS (irritable bowel syndrome)    with constipation   Obesity 08/26/2007   Qualifier: Diagnosis of  By: Milinda Antis MD, Colon Flattery    OSTEOARTHRITIS, HANDS, BILATERAL 08/26/2007   Qualifier: Diagnosis of  By: Milinda Antis  MD, Colon Flattery    Osteopenia    OVERACTIVE BLADDER 08/26/2007   Qualifier: Diagnosis of  By: Milinda Antis MD, Colon Flattery    Personal history of colonic polyps 11/03/2010   05/2018 13 polyps mostly adenomas, ssp's - recall 1 year 2020 Iva Boop, MD, [facility]    Poor balance 11/25/2018   With falls  Has rise in R shoe/post polio  Weak legs as well     S/P TAVR (transcatheter aortic valve replacement)    s/p TAVR with a 23mm Edwards S3U via the TF approach by Dr. Laneta Simmers and Dr. Clifton James   Severe calcific aortic stenosis 08/27/2017    -> Became severe as of October 2020-referred for TAVR on 04/28/2019   URINARY INCONTINENCE 08/26/2007   Qualifier: Diagnosis of  By: Milinda Antis MD, Colon Flattery    Wears hearing aid in both ears     Patient Active Problem List   Diagnosis Date Noted   Tick bite of right upper arm 12/11/2022   Diabetes mellitus screening 11/25/2022   Auditory hallucination 08/28/2022   S/P total knee arthroplasty, right 05/22/2022   Laryngopharyngeal reflux 11/20/2021   Tinnitus 08/18/2020   Lower extremity edema 02/21/2020   S/P TAVR (transcatheter aortic valve replacement)    Poor balance  11/25/2018   History of endometrial cancer 09/12/2017   Essential hypertension 08/29/2017   Severe calcific aortic stenosis 08/27/2017   Estrogen deficiency 11/05/2016   Encounter for general adult medical examination with abnormal findings 10/15/2015   Benign paroxysmal positional vertigo 06/18/2014   Hyperlipidemia with target LDL less than 100 09/02/2012   Degenerative disk disease 08/31/2011   Personal history of colonic polyps 11/03/2010   Gout 08/25/2010   DERMATITIS, SEBORRHEIC 08/25/2010   Osteopenia 09/05/2007   History of poliomyelitis 08/26/2007   Anxiety and depression 08/26/2007   ALLERGIC RHINITIS 08/26/2007   GERD 08/26/2007   IBS 08/26/2007   OVERACTIVE BLADDER 08/26/2007   OSTEOARTHRITIS, HANDS, BILATERAL 08/26/2007   URINARY INCONTINENCE 08/26/2007    Past Surgical  History:  Procedure Laterality Date   ANKLE SURGERY Right 1956   CARPAL TUNNEL RELEASE Bilateral 1986, 1993   CATARACT EXTRACTION Right 11/16/2019   COLONOSCOPY N/A 06/24/2015   Procedure: COLONOSCOPY;  Surgeon: Iva Boop, MD;  Location: [facility];  Service: Endoscopy;  Laterality: N/A;   DILATATION & CURETTAGE/HYSTEROSCOPY WITH MYOSURE N/A 08/09/2017   Procedure: DILATATION & CURETTAGE/HYSTEROSCOPY WITH MYOSURE;  Surgeon: Gerald Leitz, MD;  Location: [facility];  Service: Gynecology;  Laterality: N/A;  Polypectomy   DILATION AND CURETTAGE OF UTERUS     PMB   HAND SURGERY Bilateral 1999   Thumbs    KNEE SURGERY Bilateral 1998   x2   LYMPH NODE BIOPSY N/A 10/08/2017   Procedure: LYMPH NODE BIOPSY;  Surgeon: Adolphus Birchwood, MD;  Location: [facility];  Service: Gynecology;  Laterality: N/A;   MULTIPLE TOOTH EXTRACTIONS     NASAL SINUS SURGERY  1976   polio surgery.     right foot - right leg 1.5" shorter than left leg - some wekness   RIGHT/LEFT HEART CATH AND CORONARY ANGIOGRAPHY N/A 04/09/2019   Procedure: RIGHT/LEFT HEART CATH AND CORONARY ANGIOGRAPHY;  Surgeon: Marykay Lex, MD;  Location: [facility] INVASIVE CV LAB;  Service: Cardiovascular;; Severe AS, mean gradient 50 mmHg P-P 55 mmHg (estimated AVA 0.76 cm).  Mildly elevated pulmonary pressures with elevated LVEDP and PCWP.  Angiographically normal coronary arteries, but tortuous.   ROBOTIC ASSISTED TOTAL HYSTERECTOMY WITH BILATERAL SALPINGO OOPHERECTOMY Bilateral 10/08/2017   Procedure: XI ROBOTIC ASSISTED TOTAL HYSTERECTOMY WITH BILATERAL SALPINGO OOPHORECTOMY;  Surgeon: Adolphus Birchwood, MD;  Location: [facility];  Service: Gynecology;  Laterality: Bilateral;   TEE WITHOUT CARDIOVERSION N/A 04/28/2019   Procedure: TRANSESOPHAGEAL ECHOCARDIOGRAM (TEE);  Surgeon: Kathleene Hazel, MD;  Location: [facility] INVASIVE CV LAB;  Service: Open Heart Surgery;  Laterality: N/A;   THUMB ARTHROSCOPY  1999   TONSILLECTOMY  50108   77 years old   TOTAL KNEE  ARTHROPLASTY Right 05/22/2022   Procedure: TOTAL KNEE ARTHROPLASTY;  Surgeon: Sheral Apley, MD;  Location: [facility];  Service: Orthopedics;  Laterality: Right;   TRANSCATHETER AORTIC VALVE REPLACEMENT, TRANSFEMORAL N/A 04/28/2019   Procedure: TRANSCATHETER AORTIC VALVE REPLACEMENT, TRANSFEMORAL;  Surgeon: Kathleene Hazel, MD;  Location: [facility] INVASIVE CV LAB; ; 23 mm Sapien 3 Ultra THV via TF approach. (post-op mean AV gradient 13 mmHg)   TRANSTHORACIC ECHOCARDIOGRAM  05/15/2021   Normal/stable LV size and function.  EF 65 to 70%.  No RWMA.  GR 2 DD.  Moderate LA dilation.  Normal RV with mild RA dilation, normal RAP.Marland Kitchen  Severe MAC with no MR.  23 mm SAPIEN prosthetic TAVR valve well-seated.  Mean AVG 18 mm..  Trivial AI.  (No significant change from 05/04/2020-mean AVG  14 mmHg).   TRANSTHORACIC ECHOCARDIOGRAM  02/17/2019   Mod LVH. GR 2 DD. Severe AS (mean gradient = 44 mmHg with Mod AI. mild MS.   TRANSTHORACIC ECHOCARDIOGRAM  04/29/2019; 05/2020   a)  (post TAVR): EF 60-65%.  GRII DD.  Mod LA dilation & nl RA.  Mod MAC.  No  AI (no-PVL), mean AV gradient 13 mmHg.;; b) (2nd post TAVR) EF 60 to 65%.  Severely increased thickness/LVH.  GRII DD with only mildly elevated left atrial pressure.  Normal RA size.  Severe MAC with only trace MR.  No MS.  TAVR valve prosthesis functioning well.  Trivial AI.  Mean gradient 15 mmHg with a peak 29.8 mmHg   TUBAL LIGATION     UPPER GI ENDOSCOPY     normal   WISDOM TOOTH EXTRACTION      OB History   No obstetric history on file.      Home Medications    Prior to Admission medications   Medication Sig Start Date End Date Taking? Authorizing Provider  doxycycline (VIBRAMYCIN) 100 MG capsule Take 1 capsule (100 mg total) by mouth 2 (two) times daily for 5 days. 12/26/22 12/31/22 Yes Coltin Casher, Acie Fredrickson, FNP  acetaminophen (TYLENOL) 500 MG tablet Take 1,000 mg by mouth every 6 (six) hours as needed for moderate pain.    [provider]  acetic  acid-hydrocortisone (VOSOL-HC) OTIC solution Place 2 drops into both ears 2 (two) times daily as needed (itching). Patient not taking: Reported on 12/18/2022    [provider]  amLODipine (NORVASC) 10 MG tablet Take 1 tablet (10 mg total) by mouth daily. 07/20/22   Marykay Lex, MD  Ascorbic Acid (VITAMIN C) 1000 MG tablet Take 1,000 mg by mouth daily with breakfast.    [provider]  aspirin EC 81 MG tablet Take 1 tablet (81 mg total) by mouth 2 (two) times daily. To prevent blood clots for 30 days after surgery. 05/23/22   Jenne Pane, PA-C  Black Pepper-Turmeric (TURMERIC COMPLEX/BLACK PEPPER PO) Take 15 mLs by mouth daily with breakfast.     [provider]  Calcium-Phosphorus-Vitamin D (CALCIUM/VITAMIN D3/ADULT GUMMY PO) Take 1 tablet by mouth 2 (two) times daily.     [provider]  cetirizine (ZYRTEC) 10 MG tablet Take 10 mg by mouth daily.    [provider]  cholecalciferol (VITAMIN D) 1000 UNITS tablet Take 1,000 Units by mouth daily with breakfast.     [provider]  fluticasone (FLONASE) 50 MCG/ACT nasal spray Place 2 sprays into both nostrils at bedtime.     [provider]  furosemide (LASIX) 20 MG tablet MAY TAKE 1 TABLET UP TO THREE TIMES A WEEK. DO NOT TAKE HCTZ THAT DAY. 07/20/22   Marykay Lex, MD  hydrochlorothiazide (HYDRODIURIL) 25 MG tablet Take 1 tablet (25 mg total) by mouth daily with lunch. Patient taking differently: Take 25 mg by mouth See admin instructions. Take 25 mg by mouth Sunday, Monday, Wednesday and Friday 11/30/21   Tower, Audrie Gallus, MD  lubiprostone (AMITIZA) 24 MCG capsule Take 1 capsule (24 mcg total) by mouth 2 (two) times daily. LUNCH & SUPPER 11/30/21   Tower, Audrie Gallus, MD  meclizine (ANTIVERT) 25 MG tablet TAKE 1/2 TO 1 TABLET BY MOUTH 3 TIMES DAILY AS NEEDED FOR DIZZINESS. CAUTION OF SEDATION 08/23/22   Tower, Audrie Gallus, MD  meloxicam (MOBIC) 7.5 MG tablet TAKE 1 TABLET BY MOUTH AT  BEDTIME. 12/06/22  Tower, Audrie Gallus, MD  metoprolol succinate (TOPROL-XL) 25 MG 24 hr tablet Take 1 tablet (25 mg total) by mouth daily. Patient taking differently: Take 25 mg by mouth at bedtime. 03/02/22   Marykay Lex, MD  mupirocin ointment (BACTROBAN) 2 % Apply 1 Application topically 2 (two) times daily. To wound Patient not taking: Reported on 12/18/2022 04/03/22   Tower, Audrie Gallus, MD  NEXIUM 40 MG capsule Take 40 mg by mouth in the morning. 10/26/19   [provider]  Polyethyl Glycol-Propyl Glycol 0.4-0.3 % SOLN Place 1-2 drops into both eyes at bedtime.    [provider]  potassium chloride (KLOR-CON) 10 MEQ tablet TAKE 2 TABLETS (20 MEQ TOTAL) BY MOUTH 2 (TWO) TIMES DAILY. 12/06/22   Tower, Audrie Gallus, MD  sodium chloride (OCEAN) 0.65 % nasal spray Place 1 spray into the nose daily.    [provider]  triamcinolone cream (KENALOG) 0.1 % Apply topically 2 (two) times daily. To affected area/ rash Patient not taking: Reported on 12/18/2022 04/23/22   [name], MD  venlafaxine XR (EFFEXOR XR) 75 MG 24 hr capsule Take 1 capsule (75 mg total) by mouth daily after breakfast. 11/30/21   Tower, Audrie Gallus, MD    Family History Family History  Problem Relation Age of Onset   Kidney disease Mother    Alzheimer's disease Mother    Stroke Father 91   Colon cancer Father 71       possibly colon cancer, possibly just polyps   Kidney disease Sister    Heart attack Sister    Colon cancer Paternal Aunt 69   Cancer Paternal Uncle        unk type   Esophageal cancer Neg Hx    Stomach cancer Neg Hx    Rectal cancer Neg Hx    Breast cancer Neg Hx    Ovarian cancer Neg Hx    Endometrial cancer Neg Hx    Pancreatic cancer Neg Hx    Prostate cancer Neg Hx     Social History Social History   Tobacco Use   Smoking status: Never    Passive exposure: Past   Smokeless tobacco: Never  Vaping Use   Vaping Use: Never used  Substance Use Topics   Alcohol use: No     Alcohol/week: 0.0 standard drinks of alcohol   Drug use: No     Allergies   Clarithromycin, Penicillins, Other, and Codeine   Review of Systems Review of Systems Per HPI  Physical Exam Triage Vital Signs ED Triage Vitals  Enc Vitals Group     BP 12/26/22 1356 (!) 169/63     Pulse Rate 12/26/22 1356 72     Resp 12/26/22 1356 16     Temp 12/26/22 1356 98.2 F (36.8 C)     Temp Source 12/26/22 1356 Oral     SpO2 12/26/22 1356 97 %     Weight --      Height --      Head Circumference --      Peak Flow --      Pain Score 12/26/22 1357 5     Pain Loc --      Pain Edu? --      Excl. in GC? --    No data found.  Updated Vital Signs BP 137/69 (BP Location: Left Arm)   Pulse 72   Temp 98.2 F (36.8 C) (Oral)   Resp 16   SpO2 97%  Visual Acuity Right Eye Distance:   Left Eye Distance:   Bilateral Distance:    Right Eye Near:   Left Eye Near:    Bilateral Near:     Physical Exam Constitutional:      General: She is not in acute distress.    Appearance: Normal appearance. She is not toxic-appearing or diaphoretic.  HENT:     Head: Normocephalic and atraumatic.  Eyes:     Extraocular Movements: Extraocular movements intact.     Conjunctiva/sclera: Conjunctivae normal.  Pulmonary:     Effort: Pulmonary effort is normal.  Skin:    Comments: Patient has approximately 1 inch very superficial skin tear present to mid right anterior shin.  Bleeding controlled.  Very mild swelling and redness surrounding with no purulent drainage.  Patient appears neurovascularly intact.  Neurological:     General: No focal deficit present.     Mental Status: She is alert and oriented to person, place, and time. Mental status is at baseline.  Psychiatric:        Mood and Affect: Mood normal.        Behavior: Behavior normal.        Thought Content: Thought content normal.        Judgment: Judgment normal.      UC Treatments / Results  Labs (all labs ordered are listed, but  only abnormal results are displayed) Labs Reviewed - No data to display  EKG   Radiology No results found.  Procedures Laceration Repair  Date/Time: 12/26/2022 2:42 PM  Performed by: Gustavus Bryant, FNP Authorized by: Gustavus Bryant, FNP   Consent:    Consent obtained:  Verbal   Consent given by:  Patient   Risks, benefits, and alternatives were discussed: yes     Risks discussed:  Infection and pain Universal protocol:    Procedure explained and questions answered to patient or proxy's satisfaction: yes     Immediately prior to procedure, a time out was called: yes     Patient identity confirmed:  Verbally with patient and arm band Anesthesia:    Anesthesia method:  Local infiltration   Local anesthetic:  Lidocaine 1% w/o epi Laceration details:    Location:  Leg   Leg location:  R lower leg   Length (cm):  2 Exploration:    Imaging outcome: foreign body not noted     Wound exploration: wound explored through full range of motion and entire depth of wound visualized     Contaminated: no   Treatment:    Wound cleansed with: antiseptic cleanser. Repair type:    Repair type:  Simple Post-procedure details:    Dressing:  Non-adherent dressing   Procedure completion:  Tolerated well, no immediate complications Comments:     Patient has a very superficial skin tear with avulsed loosely attached skin that appears to be bluish black discolored.  Lidocaine was infiltrated and loosely attached skin was removed gently with scissors.  No other complications noted.  Patient tolerated procedure well.  (including critical care time)  Medications Ordered in UC Medications  Tdap (BOOSTRIX) injection 0.5 mL (0.5 mLs Intramuscular Given 12/26/22 1435)    Initial Impression / Assessment and Plan / UC Course  I have reviewed the triage vital signs and the nursing notes.  Pertinent labs & imaging results that were available during my care of the patient were reviewed by me and  considered in my medical decision making (see chart for details).  Patient has skin tear.  Gently removed loosely attached skin to help facilitate healing.  Nonadherent dressing applied and patient advised to change dressing daily and as needed if it becomes soiled.  She was advised to monitor for worsening signs of infection and to follow-up sooner if they occur.  Mild amount of swelling and erythema noted surrounding skin tears so will treat with 5 days of doxycycline.  Tetanus vaccine updated today as patient is barely inside the 5-year treatment window.  She wishes to proceed with tetanus vaccine.  Patient verbalized understanding and was agreeable with plan. Final Clinical Impressions(s) / UC Diagnoses   Final diagnoses:  Skin tear of right lower leg without complication, initial encounter     Discharge Instructions      Monitor for signs of infection that include increased redness, swelling, pus and follow-up of this occurs.  Antibiotic has been prescribed.  Take this with food to avoid stomach upset.    ED Prescriptions     Medication Sig Dispense Auth. Provider   doxycycline (VIBRAMYCIN) 100 MG capsule Take 1 capsule (100 mg total) by mouth 2 (two) times daily for 5 days. 10 capsule Gustavus Bryant, [place]      PDMP not reviewed this encounter.   Gustavus Bryant, [place] 12/26/22 1456    Gustavus Bryant, [place] 12/26/22 [phone]

## 2022-12-26 NOTE — Discharge Instructions (Signed)
Monitor for signs of infection that include increased redness, swelling, pus and follow-up of this occurs.  Antibiotic has been prescribed.  Take this with food to avoid stomach upset.

## 2022-12-26 NOTE — ED Triage Notes (Signed)
Patient with laceration to right shin after hitting leg on dishwasher Monday. Patient just wants to make sure it isn't infected.

## 2023-01-02 DIAGNOSIS — S81801A Unspecified open wound, right lower leg, initial encounter: Secondary | ICD-10-CM | POA: Diagnosis not present

## 2023-01-23 DIAGNOSIS — S81801D Unspecified open wound, right lower leg, subsequent encounter: Secondary | ICD-10-CM | POA: Diagnosis not present

## 2023-01-24 ENCOUNTER — Other Ambulatory Visit: Payer: Self-pay | Admitting: Family Medicine

## 2023-02-06 ENCOUNTER — Other Ambulatory Visit: Payer: Self-pay | Admitting: Family Medicine

## 2023-02-12 ENCOUNTER — Encounter: Payer: Self-pay | Admitting: Internal Medicine

## 2023-02-12 ENCOUNTER — Ambulatory Visit (AMBULATORY_SURGERY_CENTER): Payer: PPO | Admitting: *Deleted

## 2023-02-12 VITALS — Ht 66.0 in | Wt 166.0 lb

## 2023-02-12 DIAGNOSIS — Z86010 Personal history of colonic polyps: Secondary | ICD-10-CM

## 2023-02-12 MED ORDER — NA SULFATE-K SULFATE-MG SULF 17.5-3.13-1.6 GM/177ML PO SOLN: 1.0000 | Freq: Once | ORAL | 0 refills | Status: AC

## 2023-02-12 NOTE — Progress Notes (Addendum)
Pt's name and DOB verified at the beginning of the pre-visit.  Pt denies any difficulty with ambulating,sitting, laying down or rolling side to side Gave both [facility] main # and MD on call # prior to instructions.  No egg or soy allergy known to patient  No issues known to pt with past sedation with any surgeries or procedures Pt denies having issues being intubated Pt has no issues moving head neck or swallowing No FH of Malignant Hyperthermia Pt is not on diet pills Pt is not on home 02  Pt is not on blood thinners  Pt denies issues with constipation  Pt is not on dialysis Pt denise any abnormal heart rhythms  Pt denies any upcoming cardiac testing Pt encouraged to use to use Singlecare or Goodrx to reduce cost  Patient's chart reviewed by Cathlyn Parsons CNRA prior to pre-visit and patient appropriate for the [facility].  Pre-visit completed and red dot placed by patient's name on their procedure day (on provider's schedule).  . Visit in person Pt states weight is 166 lb Instructed pt why it is important to and  to call if they have any changes in health or new medications. Directed them to the # given and on instructions.   Pt states they will.  Instructions reviewed with pt and pt states understanding. Instructed to review again prior to procedure. Pt states they will.  Instructions given to pt  with coupon

## 2023-02-13 DIAGNOSIS — S81801D Unspecified open wound, right lower leg, subsequent encounter: Secondary | ICD-10-CM | POA: Diagnosis not present

## 2023-02-20 ENCOUNTER — Other Ambulatory Visit: Payer: Self-pay | Admitting: Cardiology

## 2023-02-20 ENCOUNTER — Other Ambulatory Visit: Payer: Self-pay | Admitting: Family Medicine

## 2023-02-21 ENCOUNTER — Inpatient Hospital Stay: Payer: PPO | Attending: Gynecologic Oncology | Admitting: Gynecologic Oncology

## 2023-02-21 ENCOUNTER — Encounter: Payer: Self-pay | Admitting: Gynecologic Oncology

## 2023-02-21 ENCOUNTER — Other Ambulatory Visit: Payer: Self-pay

## 2023-02-21 VITALS — BP 144/53 | HR 66 | Temp 97.7°F | Resp 17 | Wt 169.2 lb

## 2023-02-21 DIAGNOSIS — Z90710 Acquired absence of both cervix and uterus: Secondary | ICD-10-CM | POA: Insufficient documentation

## 2023-02-21 DIAGNOSIS — Z923 Personal history of irradiation: Secondary | ICD-10-CM | POA: Insufficient documentation

## 2023-02-21 DIAGNOSIS — Z08 Encounter for follow-up examination after completed treatment for malignant neoplasm: Secondary | ICD-10-CM | POA: Diagnosis not present

## 2023-02-21 DIAGNOSIS — Z90722 Acquired absence of ovaries, bilateral: Secondary | ICD-10-CM | POA: Diagnosis not present

## 2023-02-21 DIAGNOSIS — C541 Malignant neoplasm of endometrium: Secondary | ICD-10-CM

## 2023-02-21 DIAGNOSIS — Z8542 Personal history of malignant neoplasm of other parts of uterus: Secondary | ICD-10-CM | POA: Diagnosis not present

## 2023-02-21 NOTE — Progress Notes (Signed)
Gynecologic Oncology Return Clinic Visit  02/21/23  Reason for Visit: Surveillance in the setting of early stage uterine cancer   Treatment History: She first began vaginal spotting in July 2017.  She was seen for an annual visit with Dr. Richardson Dopp in October 2018 and reported this symptom.  A transvaginal ultrasound scan was performed on June 25, 2017 and revealed a normal-sized uterus measuring 7 x 4 x 2.7 cm with normal ovaries bilaterally.  The endometrial thickness was 1.28 cm.  On saline infusion the endometrial hyperechoic mass measured 7 mm. The tumor was MSI high, MMR normal.  She had MLH1 promotor methylation in her tumor but is negative for Lynch syndrome on genetic testing.    She was noted to have a systolic cardiac murmur and was seen by cardiology who performed a transthoracic echo on July 30, 2017.  This revealed moderate aortic stenosis.  The left ventricle was grossly normal in cavity size, though with some increased ventricular wall thickness.  Ejection fraction was 60-65%.  She was cleared for surgical procedure.  Of note she was asymptomatic from this aortic stenosis.  She has a remote history of rheumatic heart disease as a child.   On August 09, 2017 she underwent a hysteroscopy D&C.  Final pathology confirmed a FIGO grade 1 endometrioid endometrial adenocarcinoma.   On 10/08/17 she underwent robotic assisted total hysterectomy, BSO and SLN biopsy. Final pathology revealed a 3.2cm grade 2 endometrioid endometrial adenocarcinoma with outer half myo invasion (1 of 1.2cm) and no LVSI. All SLN's were negative as were adnexa and cervix. MSI pending. She was determined to have high/intermediate risk factors and was recommended to have vaginal brachytherapy in accordance with NCCN guidelines.   She completed vaginal brachytherapy with 30Gy in 5 fractions on 12/19/17.   She has persistent right lower quadrant intermittent pains which were evaluated with a CT scan in June, 2020  which was negative for recurrence.   She had an aortic valve replaced in 2020 and is doing very well after this.    CT scan in December, 2021 for RLE edema was negative for metastatic disease or DVT.   Last mammogram 06/2021 Bone density 12/2020 Colonoscopy 04/2020  Interval History: Doing well.  Denies vaginal bleeding or discharge.  Denies any abdominal or pelvic pain.  Reports baseline bowel bladder function.  Scheduled to have her colonoscopy the week after her upcoming birthday.  Past Medical/Surgical History: Past Medical History:  Diagnosis Date   Anxiety and depression 08/26/2007   Qualifier: Diagnosis of  By: Milinda Antis MD, Colon Flattery    Arthritis    knees   Constipation, slow transit 11/03/2010   Degenerative disk disease 08/31/2011   Depression with anxiety    DERMATITIS, SEBORRHEIC 08/25/2010   Qualifier: Diagnosis of  By: Milinda Antis MD, Colon Flattery    Endometrial cancer [facility])    02/ 2019  Diagnosed with D&C/hysteroscopy with polypectomy   Essential hypertension 08/29/2017   Fibromyalgia    tx mobic   Full dentures    GERD 08/26/2007   Qualifier: Diagnosis of  By: Milinda Antis MD, Colon Flattery    Gout    Hearing loss    wears bilateral hearing aids   History of colon polyps    History of nonmelanoma skin cancer    right lower leg   History of poliomyelitis 08/26/2007   Qualifier: History of  By: Milinda Antis MD, Colon Flattery    History of radiation therapy    Endometium- 11/20/17-12/11/17- Vag Cuff -Dr. Fayrene Fearing  [place]   Hyperlipidemia, mild 09/02/2012   Hypertension    Hypokalemia 01/02/2011   IBS (irritable bowel syndrome)    with constipation   Obesity 08/26/2007   Qualifier: Diagnosis of  By: Milinda Antis MD, Colon Flattery    OSTEOARTHRITIS, HANDS, BILATERAL 08/26/2007   Qualifier: Diagnosis of  By: Milinda Antis MD, Colon Flattery    Osteopenia    OVERACTIVE BLADDER 08/26/2007   Qualifier: Diagnosis of  By: Milinda Antis MD, Colon Flattery    Personal history of colonic polyps 11/03/2010   05/2018 13 polyps mostly  adenomas, ssp's - recall 1 year 2020 Iva Boop, MD, [facility]    Poor balance 11/25/2018   With falls  Has rise in R shoe/post polio  Weak legs as well     S/P TAVR (transcatheter aortic valve replacement)    s/p TAVR with a 23mm Edwards S3U via the TF approach by Dr. Laneta Simmers and Dr. Clifton James   Severe calcific aortic stenosis 08/27/2017    -> Became severe as of October 2020-referred for TAVR on 04/28/2019   URINARY INCONTINENCE 08/26/2007   Qualifier: Diagnosis of  By: Milinda Antis MD, Colon Flattery    Wears hearing aid in both ears     Past Surgical History:  Procedure Laterality Date   ANKLE SURGERY Right 1956   CARPAL TUNNEL RELEASE Bilateral 1986, 1993   CATARACT EXTRACTION Right 11/16/2019   COLONOSCOPY N/A 06/24/2015   Procedure: COLONOSCOPY;  Surgeon: Iva Boop, MD;  Location: [facility];  Service: Endoscopy;  Laterality: N/A;   DILATATION & CURETTAGE/HYSTEROSCOPY WITH MYOSURE N/A 08/09/2017   Procedure: DILATATION & CURETTAGE/HYSTEROSCOPY WITH MYOSURE;  Surgeon: Gerald Leitz, MD;  Location: [facility] OR[facility];  Service: Gynecology;  Laterality: N/A;  Polypectomy   DILATION AND CURETTAGE OF UTERUS     PMB   HAND SURGERY Bilateral 1999   Thumbs    KNEE SURGERY Bilateral 1998   x2   LYMPH NODE BIOPSY N/A 10/08/2017   Procedure: LYMPH NODE BIOPSY;  Surgeon: Adolphus Birchwood, MD;  Location: [facility];  Service: Gynecology;  Laterality: N/A;   MULTIPLE TOOTH EXTRACTIONS     NASAL SINUS SURGERY  1976   polio surgery.     right foot - right leg 1.5" shorter than left leg - some wekness   RIGHT/LEFT HEART CATH AND CORONARY ANGIOGRAPHY N/A 04/09/2019   Procedure: RIGHT/LEFT HEART CATH AND CORONARY ANGIOGRAPHY;  Surgeon: Marykay Lex, MD;  Location: [facility] INVASIVE CV LAB;  Service: Cardiovascular;; Severe AS, mean gradient 50 mmHg P-P 55 mmHg (estimated AVA 0.76 cm).  Mildly elevated pulmonary pressures with elevated LVEDP and PCWP.  Angiographically normal coronary arteries, but tortuous.   ROBOTIC  ASSISTED TOTAL HYSTERECTOMY WITH BILATERAL SALPINGO OOPHERECTOMY Bilateral 10/08/2017   Procedure: XI ROBOTIC ASSISTED TOTAL HYSTERECTOMY WITH BILATERAL SALPINGO OOPHORECTOMY;  Surgeon: Adolphus Birchwood, MD;  Location: [facility];  Service: Gynecology;  Laterality: Bilateral;   TEE WITHOUT CARDIOVERSION N/A 04/28/2019   Procedure: TRANSESOPHAGEAL ECHOCARDIOGRAM (TEE);  Surgeon: Kathleene Hazel, MD;  Location: [facility] INVASIVE CV LAB;  Service: Open Heart Surgery;  Laterality: N/A;   THUMB ARTHROSCOPY  1999   TONSILLECTOMY  744   77 years old   TOTAL KNEE ARTHROPLASTY Right 05/22/2022   Procedure: TOTAL KNEE ARTHROPLASTY;  Surgeon: Sheral Apley, MD;  Location: [facility];  Service: Orthopedics;  Laterality: Right;   TRANSCATHETER AORTIC VALVE REPLACEMENT, TRANSFEMORAL N/A 04/28/2019   Procedure: TRANSCATHETER AORTIC VALVE REPLACEMENT, TRANSFEMORAL;  Surgeon: Kathleene Hazel, MD;  Location: [facility] INVASIVE CV LAB; ;  23 mm Sapien 3 Ultra THV via TF approach. (post-op mean AV gradient 13 mmHg)   TRANSTHORACIC ECHOCARDIOGRAM  05/15/2021   Normal/stable LV size and function.  EF 65 to 70%.  No RWMA.  GR 2 DD.  Moderate LA dilation.  Normal RV with mild RA dilation, normal RAP.Marland Kitchen  Severe MAC with no MR.  23 mm SAPIEN prosthetic TAVR valve well-seated.  Mean AVG 18 mm..  Trivial AI.  (No significant change from 05/04/2020-mean AVG 14 mmHg).   TRANSTHORACIC ECHOCARDIOGRAM  02/17/2019   Mod LVH. GR 2 DD. Severe AS (mean gradient = 44 mmHg with Mod AI. mild MS.   TRANSTHORACIC ECHOCARDIOGRAM  04/29/2019; 05/2020   a)  (post TAVR): EF 60-65%.  GRII DD.  Mod LA dilation & nl RA.  Mod MAC.  No  AI (no-PVL), mean AV gradient 13 mmHg.;; b) (2nd post TAVR) EF 60 to 65%.  Severely increased thickness/LVH.  GRII DD with only mildly elevated left atrial pressure.  Normal RA size.  Severe MAC with only trace MR.  No MS.  TAVR valve prosthesis functioning well.  Trivial AI.  Mean gradient 15 mmHg with a peak 29.8 mmHg    TUBAL LIGATION     UPPER GI ENDOSCOPY     normal   WISDOM TOOTH EXTRACTION      Family History  Problem Relation Age of Onset   Kidney disease Mother    Alzheimer's disease Mother    Stroke Father 86   Colon cancer Father 69       possibly colon cancer, possibly just polyps   Kidney disease Sister    Heart attack Sister    Colon cancer Paternal Aunt 26   Cancer Paternal Uncle        unk type   Esophageal cancer Neg Hx    Stomach cancer Neg Hx    Rectal cancer Neg Hx    Breast cancer Neg Hx    Ovarian cancer Neg Hx    Endometrial cancer Neg Hx    Pancreatic cancer Neg Hx    Prostate cancer Neg Hx    Colon polyps Neg Hx     Social History   Socioeconomic History   Marital status: Widowed    Spouse name: Not on file   Number of children: 2   Years of education: Not on file   Highest education level: High school graduate  Occupational History   Occupation: RECEPTION    Employer: [employer]    Comment: Retired; Theme park manager reception  Tobacco Use   Smoking status: Never    Passive exposure: Past   Smokeless tobacco: Never  Vaping Use   Vaping status: Never Used  Substance and Sexual Activity   Alcohol use: No    Alcohol/week: 0.0 standard drinks of alcohol   Drug use: No   Sexual activity: Yes    Birth control/protection: Post-menopausal  Other Topics Concern   Not on file  Social History Narrative   She is relatively recently remarried.  She has 2  children and 3 grandchildren.     She is currently retired.  But enjoys cleaning house and doing chores.  She likes to do yard work.  Does not routinely exercise.   Social Determinants of Health   Financial Resource Strain: Low Risk  (12/18/2022)   Overall Financial Resource Strain (CARDIA)    Difficulty of Paying Living Expenses: Not hard at all  Food Insecurity: No Food Insecurity (12/18/2022)   Hunger Vital Sign  Worried About Programme researcher, broadcasting/film/video in the Last Year: Never true    Ran Out of Food in the Last  Year: Never true  Transportation Needs: No Transportation Needs (12/18/2022)   PRAPARE - Administrator, Civil Service (Medical): No    Lack of Transportation (Non-Medical): No  Physical Activity: Inactive (12/18/2022)   Exercise Vital Sign    Days of Exercise per Week: 0 days    Minutes of Exercise per Session: 0 min  Stress: No Stress Concern Present (12/18/2022)   [employer] of Occupational Health - Occupational Stress Questionnaire    Feeling of Stress : Not at all  Social Connections: Moderately Integrated (12/18/2022)   Social Connection and Isolation Panel [NHANES]    Frequency of Communication with Friends and Family: More than three times a week    Frequency of Social Gatherings with Friends and Family: More than three times a week    Attends Religious Services: 1 to 4 times per year    Active Member of [employer] or Organizations: No    Attends Banker Meetings: Never    Marital Status: Married    Current Medications:  Current Outpatient Medications:    amLODipine (NORVASC) 10 MG tablet, Take 1 tablet (10 mg total) by mouth daily., Disp: 90 tablet, Rfl: 3   Ascorbic Acid (VITAMIN C) 1000 MG tablet, Take 1,000 mg by mouth daily with breakfast., Disp: , Rfl:    aspirin EC 81 MG tablet, Take 1 tablet (81 mg total) by mouth 2 (two) times daily. To prevent blood clots for 30 days after surgery., Disp: 60 tablet, Rfl: 0   Black Pepper-Turmeric (TURMERIC COMPLEX/BLACK PEPPER PO), Take 15 mLs by mouth daily with breakfast. , Disp: , Rfl:    Calcium-Phosphorus-Vitamin D (CALCIUM/VITAMIN D3/ADULT GUMMY PO), Take 1 tablet by mouth 2 (two) times daily. , Disp: , Rfl:    cetirizine (ZYRTEC) 10 MG tablet, Take 10 mg by mouth daily., Disp: , Rfl:    cholecalciferol (VITAMIN D) 1000 UNITS tablet, Take 1,000 Units by mouth daily with breakfast. , Disp: , Rfl:    fluticasone (FLONASE) 50 MCG/ACT nasal spray, Place 2 sprays into both nostrils at bedtime. , Disp: , Rfl:     furosemide (LASIX) 20 MG tablet, MAY TAKE 1 TABLET UP TO THREE TIMES A WEEK. DO NOT TAKE HCTZ THAT DAY., Disp: 30 tablet, Rfl: 11   lubiprostone (AMITIZA) 24 MCG capsule, TAKE 1 CAPSULE (24 MCG TOTAL) BY MOUTH 2 (TWO) TIMES DAILY. LUNCH & SUPPER, Disp: 180 capsule, Rfl: 3   meclizine (ANTIVERT) 25 MG tablet, TAKE 1/2 TO 1 TABLET BY MOUTH 3 TIMES DAILY AS NEEDED FOR DIZZINESS. CAUTION OF SEDATION, Disp: 20 tablet, Rfl: 0   meloxicam (MOBIC) 7.5 MG tablet, TAKE 1 TABLET BY MOUTH AT BEDTIME., Disp: 90 tablet, Rfl: 0   Multiple Vitamin (MULTI-VITAMIN) tablet, Take by mouth., Disp: , Rfl:    mupirocin ointment (BACTROBAN) 2 %, Apply 1 Application topically 2 (two) times daily. To wound, Disp: 22 g, Rfl: 0   NEXIUM 40 MG capsule, Take 40 mg by mouth in the morning., Disp: , Rfl:    Polyethyl Glycol-Propyl Glycol 0.4-0.3 % SOLN, Place 1-2 drops into both eyes at bedtime., Disp: , Rfl:    potassium chloride (KLOR-CON) 10 MEQ tablet, TAKE 2 TABLETS (20 MEQ TOTAL) BY MOUTH 2 (TWO) TIMES DAILY., Disp: 360 tablet, Rfl: 0   Simethicone 180 MG CAPS, Take by mouth., Disp: , Rfl:  sodium chloride (OCEAN) 0.65 % nasal spray, Place 1 spray into the nose daily., Disp: , Rfl:    triamcinolone cream (KENALOG) 0.1 %, Apply topically 2 (two) times daily. To affected area/ rash, Disp: 30 g, Rfl: 0   venlafaxine XR (EFFEXOR-XR) 75 MG 24 hr capsule, TAKE 1 CAPSULE (75 MG TOTAL) BY MOUTH DAILY AFTER BREAKFAST., Disp: 90 capsule, Rfl: 3   hydrochlorothiazide (HYDRODIURIL) 25 MG tablet, Take 1 tablet (25 mg total) by mouth See admin instructions. Take 25 mg by mouth Sunday, Monday, Wednesday and Friday, Disp: 90 tablet, Rfl: 2   metoprolol succinate (TOPROL-XL) 25 MG 24 hr tablet, TAKE 1 TABLET (25 MG TOTAL) BY MOUTH DAILY., Disp: 90 tablet, Rfl: 2  Review of Systems: + Hearing loss, ringing in ears, voice changes, hot flashes, dizziness, slight problems with walking Denies appetite changes, fevers, chills, fatigue,  unexplained weight changes. Denies neck lumps or masses, mouth sores. Denies cough or wheezing.  Denies shortness of breath. Denies chest pain or palpitations. Denies leg swelling. Denies abdominal distention, pain, blood in stools, constipation, diarrhea, nausea, vomiting, or early satiety. Denies pain with intercourse, dysuria, frequency, hematuria or incontinence. Denies pelvic pain, vaginal bleeding or vaginal discharge.   Denies joint pain, back pain or muscle pain/cramps. Denies itching, rash, or wounds. Denies headaches, numbness or seizures. Denies swollen lymph nodes or glands, denies easy bruising or bleeding. Denies anxiety, depression, confusion, or decreased concentration.  Physical Exam: BP (!) 144/53 (BP Location: Left Arm, Patient Position: Sitting)   Pulse 66   Temp 97.7 F (36.5 C) (Oral)   Resp 17   Wt 169 lb 3.2 oz (76.7 kg)   SpO2 99%   BMI 27.31 kg/m  General: Alert, oriented, no acute distress. HEENT: Normocephalic, atraumatic, sclera anicteric. Chest: Clear to auscultation bilaterally.  No wheezes or rhonchi. Cardiovascular: Regular rate and rhythm, no murmurs. Abdomen: Obese, soft, nontender.  Normoactive bowel sounds.  No masses or hepatosplenomegaly appreciated.  Well-healed incisions. Extremities: Grossly normal range of motion.  Warm, well perfused.  No edema bilaterally. Skin: No rashes or lesions noted. Lymphatics: No cervical, supraclavicular, or inguinal adenopathy. GU: Normal appearing external genitalia without erythema, excoriation, or lesions.  Speculum exam reveals mildly atrophic vaginal mucosa, no lesions or masses.  Laxity of the vaginal walls noted.  Mild scarring at the vaginal cuff.  Bimanual exam reveals cuff intact without nodularity or masses.  Rectovaginal exam confirms findings.  Laboratory & Radiologic Studies: None new  Assessment & Plan: Veronica Greene is a 77 y.o. woman with stage IB grade 2 endometrioid endometrial  adenocarcinoma (MSI high/MMR normal). High/intermediate risk factors for recurrence. s/p vaginal brachytherapy completed 12/19/17. No evidence of disease on exam.    Patient is doing well.  She continues to be NED on exam.  The patient is now 5 years out from completion of adjuvant therapy for her high-intermediate risk endometrial cancer.  She has done very well and is NED.  She will be discharged from oncology care.  I have recommended that she have yearly follow-up with an OB/GYN.  We reviewed signs and symptoms that would be concerning for cancer recurrence.  I stressed the importance of calling if she develops any of these.  Pap smear is not recommended in routine endometrial cancer surveillance.  20 minutes of total time was spent for this patient encounter, including preparation, face-to-face counseling with the patient and coordination of care, and documentation of the encounter.  Eugene Garnet, MD  Division of Gynecologic Oncology  Department of Obstetrics and Gynecology  [facility]

## 2023-02-21 NOTE — Patient Instructions (Addendum)
It was great to see you today!  Congratulations on 5 years of being cancer free.  Please make sure to call your OB/GYN, Dr. Cole,[phone]) to schedule a visit in 1 year.  You should continue to have yearly visits with her or another OB/GYN.

## 2023-02-26 ENCOUNTER — Telehealth: Payer: Self-pay | Admitting: Internal Medicine

## 2023-02-26 NOTE — Telephone Encounter (Signed)
Dr. Leone Payor,   Patient called to notify that she has been taking mobic daily. Please advise if you are ok to do her procedure on 8/22 or if you would like me to get her r/s.

## 2023-02-26 NOTE — Telephone Encounter (Signed)
Proceed with procedure

## 2023-02-26 NOTE — Telephone Encounter (Signed)
Call returned all questions answered  

## 2023-02-26 NOTE — Telephone Encounter (Signed)
Inbound call from patient needing to be further advised on prep.

## 2023-02-26 NOTE — Telephone Encounter (Signed)
Patient returned call stating she had a missed call. I advised patient that she could proceed with procedure per Dr. Leone Payor. Patient is requesting to speak with nurse due to having more questions. Please advise.

## 2023-02-26 NOTE — Telephone Encounter (Signed)
VM left making patient aware.

## 2023-02-28 ENCOUNTER — Encounter: Payer: Self-pay | Admitting: Internal Medicine

## 2023-02-28 ENCOUNTER — Telehealth: Payer: Self-pay | Admitting: Internal Medicine

## 2023-02-28 ENCOUNTER — Ambulatory Visit (AMBULATORY_SURGERY_CENTER): Payer: PPO | Admitting: Internal Medicine

## 2023-02-28 VITALS — BP 108/61 | HR 65 | Temp 97.8°F | Resp 13 | Ht 66.0 in | Wt 166.0 lb

## 2023-02-28 DIAGNOSIS — D125 Benign neoplasm of sigmoid colon: Secondary | ICD-10-CM | POA: Diagnosis not present

## 2023-02-28 DIAGNOSIS — D124 Benign neoplasm of descending colon: Secondary | ICD-10-CM | POA: Diagnosis not present

## 2023-02-28 DIAGNOSIS — F418 Other specified anxiety disorders: Secondary | ICD-10-CM | POA: Diagnosis not present

## 2023-02-28 DIAGNOSIS — K635 Polyp of colon: Secondary | ICD-10-CM | POA: Diagnosis not present

## 2023-02-28 DIAGNOSIS — Z09 Encounter for follow-up examination after completed treatment for conditions other than malignant neoplasm: Secondary | ICD-10-CM

## 2023-02-28 DIAGNOSIS — D128 Benign neoplasm of rectum: Secondary | ICD-10-CM | POA: Diagnosis not present

## 2023-02-28 DIAGNOSIS — Z86010 Personal history of colonic polyps: Secondary | ICD-10-CM

## 2023-02-28 DIAGNOSIS — E669 Obesity, unspecified: Secondary | ICD-10-CM | POA: Diagnosis not present

## 2023-02-28 DIAGNOSIS — I10 Essential (primary) hypertension: Secondary | ICD-10-CM | POA: Diagnosis not present

## 2023-02-28 DIAGNOSIS — M797 Fibromyalgia: Secondary | ICD-10-CM | POA: Diagnosis not present

## 2023-02-28 DIAGNOSIS — D127 Benign neoplasm of rectosigmoid junction: Secondary | ICD-10-CM | POA: Diagnosis not present

## 2023-02-28 MED ORDER — SODIUM CHLORIDE 0.9 % IV SOLN: 500.0000 mL | Freq: Once | INTRAVENOUS | Status: DC

## 2023-02-28 NOTE — Telephone Encounter (Signed)
Inbound call from patient stating she experiencing bleeding after colonoscopy today. Patient states she is worried that she is bleeding more than she is suppose to. Patient requesting a call back to discuss further and to be advised. Please advise, thank you.

## 2023-02-28 NOTE — Progress Notes (Signed)
Called to room to assist during endoscopic procedure.  Patient ID and intended procedure confirmed with present staff. Received instructions for my participation in the procedure from the performing physician.  

## 2023-02-28 NOTE — Progress Notes (Signed)
[facility] History and Physical   Primary Care Physician:  Tower, Audrie Gallus, MD   Reason for Procedure:    Encounter Diagnosis  Name Primary?   Personal history of colonic polyps Yes     Plan:    colonoscopy     HPI: Veronica Greene is a 77 y.o. female w/ polyp hx as below:  05/2018 13 polyps mostly adenomas, ssp's - 11/2019 4 diminutive adenomas recall 2024  Past Medical History:  Diagnosis Date   Anxiety and depression 08/26/2007   Qualifier: Diagnosis of  By: Milinda Antis MD, Colon Flattery    Arthritis    knees   Constipation, slow transit 11/03/2010   Degenerative disk disease 08/31/2011   Depression with anxiety    DERMATITIS, SEBORRHEIC 08/25/2010   Qualifier: Diagnosis of  By: Milinda Antis MD, Colon Flattery    Endometrial cancer [facility])    02/ 2019  Diagnosed with D&C/hysteroscopy with polypectomy   Essential hypertension 08/29/2017   Fibromyalgia    tx mobic   Full dentures    GERD 08/26/2007   Qualifier: Diagnosis of  By: Milinda Antis MD, Colon Flattery    Gout    Hearing loss    wears bilateral hearing aids   History of colon polyps    History of nonmelanoma skin cancer    right lower leg   History of poliomyelitis 08/26/2007   Qualifier: History of  By: Milinda Antis MD, Colon Flattery    History of radiation therapy    Endometium- 11/20/17-12/11/17- Vag Cuff -Dr. Antony Blackbird   Hyperlipidemia, mild 09/02/2012   Hypertension    Hypokalemia 01/02/2011   IBS (irritable bowel syndrome)    with constipation   Obesity 08/26/2007   Qualifier: Diagnosis of  By: Milinda Antis MD, Colon Flattery    OSTEOARTHRITIS, HANDS, BILATERAL 08/26/2007   Qualifier: Diagnosis of  By: Milinda Antis MD, Colon Flattery    Osteopenia    OVERACTIVE BLADDER 08/26/2007   Qualifier: Diagnosis of  By: Milinda Antis MD, Colon Flattery    Personal history of colonic polyps 11/03/2010   05/2018 13 polyps mostly adenomas, ssp's - recall 1 year 2020 Iva Boop, MD, [facility]    Poor balance 11/25/2018   With falls  Has rise in R shoe/post polio   Weak legs as well     S/P TAVR (transcatheter aortic valve replacement)    s/p TAVR with a 23mm Edwards S3U via the TF approach by Dr. Laneta Simmers and Dr. Clifton James   Severe calcific aortic stenosis 08/27/2017    -> Became severe as of October 2020-referred for TAVR on 04/28/2019   URINARY INCONTINENCE 08/26/2007   Qualifier: Diagnosis of  By: Milinda Antis MD, Colon Flattery    Wears hearing aid in both ears     Past Surgical History:  Procedure Laterality Date   ANKLE SURGERY Right 1956   CARPAL TUNNEL RELEASE Bilateral 1986, 1993   CATARACT EXTRACTION Right 11/16/2019   COLONOSCOPY N/A 06/24/2015   Procedure: COLONOSCOPY;  Surgeon: Iva Boop, MD;  Location: [facility];  Service: Endoscopy;  Laterality: N/A;   DILATATION & CURETTAGE/HYSTEROSCOPY WITH MYOSURE N/A 08/09/2017   Procedure: DILATATION & CURETTAGE/HYSTEROSCOPY WITH MYOSURE;  Surgeon: Gerald Leitz, MD;  Location: [facility];  Service: Gynecology;  Laterality: N/A;  Polypectomy   DILATION AND CURETTAGE OF UTERUS     PMB   HAND SURGERY Bilateral 1999   Thumbs    KNEE SURGERY Bilateral 1998   x2   LYMPH NODE BIOPSY N/A 10/08/2017   Procedure: LYMPH NODE  BIOPSY;  Surgeon: Adolphus Birchwood, MD;  Location: [facility];  Service: Gynecology;  Laterality: N/A;   MULTIPLE TOOTH EXTRACTIONS     NASAL SINUS SURGERY  1976   polio surgery.     right foot - right leg 1.5" shorter than left leg - some wekness   RIGHT/LEFT HEART CATH AND CORONARY ANGIOGRAPHY N/A 04/09/2019   Procedure: RIGHT/LEFT HEART CATH AND CORONARY ANGIOGRAPHY;  Surgeon: Marykay Lex, MD;  Location: [facility] INVASIVE CV LAB;  Service: Cardiovascular;; Severe AS, mean gradient 50 mmHg P-P 55 mmHg (estimated AVA 0.76 cm).  Mildly elevated pulmonary pressures with elevated LVEDP and PCWP.  Angiographically normal coronary arteries, but tortuous.   ROBOTIC ASSISTED TOTAL HYSTERECTOMY WITH BILATERAL SALPINGO OOPHERECTOMY Bilateral 10/08/2017   Procedure: XI ROBOTIC ASSISTED TOTAL HYSTERECTOMY WITH  BILATERAL SALPINGO OOPHORECTOMY;  Surgeon: Adolphus Birchwood, MD;  Location: [facility];  Service: Gynecology;  Laterality: Bilateral;   TEE WITHOUT CARDIOVERSION N/A 04/28/2019   Procedure: TRANSESOPHAGEAL ECHOCARDIOGRAM (TEE);  Surgeon: Kathleene Hazel, MD;  Location: [facility]VAS[facility];  Service: Open Heart Surgery;  Laterality: N/A;   THUMB ARTHROSCOPY  1999   TONSILLECTOMY  9739   77 years old   TOTAL KNEE ARTHROPLASTY Right 05/22/2022   Procedure: TOTAL KNEE ARTHROPLASTY;  Surgeon: Sheral Apley, MD;  Location: [facility];  Service: Orthopedics;  Laterality: Right;   TRANSCATHETER AORTIC VALVE REPLACEMENT, TRANSFEMORAL N/A 04/28/2019   Procedure: TRANSCATHETER AORTIC VALVE REPLACEMENT, TRANSFEMORAL;  Surgeon: Kathleene Hazel, MD;  Location: [facility] INVASIVE CV LAB; ; 23 mm Sapien 3 Ultra THV via TF approach. (post-op mean AV gradient 13 mmHg)   TRANSTHORACIC ECHOCARDIOGRAM  05/15/2021   Normal/stable LV size and function.  EF 65 to 70%.  No RWMA.  GR 2 DD.  Moderate LA dilation.  Normal RV with mild RA dilation, normal RAP.Marland Kitchen  Severe MAC with no MR.  23 mm SAPIEN prosthetic TAVR valve well-seated.  Mean AVG 18 mm..  Trivial AI.  (No significant change from 05/04/2020-mean AVG 14 mmHg).   TRANSTHORACIC ECHOCARDIOGRAM  02/17/2019   Mod LVH. GR 2 DD. Severe AS (mean gradient = 44 mmHg with Mod AI. mild MS.   TRANSTHORACIC ECHOCARDIOGRAM  04/29/2019; 05/2020   a)  (post TAVR): EF 60-65%.  GRII DD.  Mod LA dilation & nl RA.  Mod MAC.  No  AI (no-PVL), mean AV gradient 13 mmHg.;; b) (2nd post TAVR) EF 60 to 65%.  Severely increased thickness/LVH.  GRII DD with only mildly elevated left atrial pressure.  Normal RA size.  Severe MAC with only trace MR.  No MS.  TAVR valve prosthesis functioning well.  Trivial AI.  Mean gradient 15 mmHg with a peak 29.8 mmHg   TUBAL LIGATION     UPPER GI ENDOSCOPY     normal   WISDOM TOOTH EXTRACTION      Prior to Admission medications   Medication Sig Start Date  End Date Taking? Authorizing Provider  amLODipine (NORVASC) 10 MG tablet Take 1 tablet (10 mg total) by mouth daily. 07/20/22  Yes Marykay Lex, MD  Ascorbic Acid (VITAMIN C) 1000 MG tablet Take 1,000 mg by mouth daily with breakfast.   Yes [provider]  aspirin EC 81 MG tablet Take 1 tablet (81 mg total) by mouth 2 (two) times daily. To prevent blood clots for 30 days after surgery. 05/23/22  Yes Gawne, Meghan M, PA-C  cetirizine (ZYRTEC) 10 MG tablet Take 10 mg by mouth daily.   Yes  [provider]  cholecalciferol (VITAMIN D) 1000 UNITS tablet Take 1,000 Units by mouth daily with breakfast.    Yes [provider]  fluticasone (FLONASE) 50 MCG/ACT nasal spray Place 2 sprays into both nostrils at bedtime.    Yes [provider]  furosemide (LASIX) 20 MG tablet MAY TAKE 1 TABLET UP TO THREE TIMES A WEEK. DO NOT TAKE HCTZ THAT DAY. 07/20/22  Yes Marykay Lex, MD  hydrochlorothiazide (HYDRODIURIL) 25 MG tablet Take 1 tablet (25 mg total) by mouth See admin instructions. Take 25 mg by mouth Sunday, Monday, Wednesday and Friday 02/20/23  Yes Tower, Idamae Schuller A, MD  lubiprostone (AMITIZA) 24 MCG capsule TAKE 1 CAPSULE (24 MCG TOTAL) BY MOUTH 2 (TWO) TIMES DAILY. LUNCH & SUPPER 02/06/23  Yes Tower, Idamae Schuller A, MD  meloxicam (MOBIC) 7.5 MG tablet TAKE 1 TABLET BY MOUTH AT BEDTIME. 12/06/22  Yes Tower, Marne A, MD  metoprolol succinate (TOPROL-XL) 25 MG 24 hr tablet TAKE 1 TABLET (25 MG TOTAL) BY MOUTH DAILY. 02/20/23  Yes Marykay Lex, MD  Multiple Vitamin (MULTI-VITAMIN) tablet Take by mouth. 04/04/16  Yes [provider]  mupirocin ointment (BACTROBAN) 2 % Apply 1 Application topically 2 (two) times daily. To wound 04/03/22  Yes Tower, Marne A, MD  NEXIUM 40 MG capsule Take 40 mg by mouth in the morning. 10/26/19  Yes [provider]  Polyethyl Glycol-Propyl Glycol 0.4-0.3 % SOLN Place 1-2 drops into both eyes at bedtime.   Yes [provider]   potassium chloride (KLOR-CON) 10 MEQ tablet TAKE 2 TABLETS (20 MEQ TOTAL) BY MOUTH 2 (TWO) TIMES DAILY. 12/06/22  Yes Tower, Audrie Gallus, MD  Simethicone 180 MG CAPS Take by mouth. 08/18/20  Yes [provider]  sodium chloride (OCEAN) 0.65 % nasal spray Place 1 spray into the nose daily.   Yes [provider]  triamcinolone cream (KENALOG) 0.1 % Apply topically 2 (two) times daily. To affected area/ rash 04/23/22  Yes Tower, Audrie Gallus, MD  venlafaxine XR (EFFEXOR-XR) 75 MG 24 hr capsule TAKE 1 CAPSULE (75 MG TOTAL) BY MOUTH DAILY AFTER BREAKFAST. 01/24/23  Yes Tower, Audrie Gallus, MD  Black Pepper-Turmeric (TURMERIC COMPLEX/BLACK PEPPER PO) Take 15 mLs by mouth daily with breakfast.  Patient not taking: Reported on 02/28/2023    [provider]  Calcium-Phosphorus-Vitamin D (CALCIUM/VITAMIN D3/ADULT GUMMY PO) Take 1 tablet by mouth 2 (two) times daily.  Patient not taking: Reported on 02/28/2023    [provider]  meclizine (ANTIVERT) 25 MG tablet TAKE 1/2 TO 1 TABLET BY MOUTH 3 TIMES DAILY AS NEEDED FOR DIZZINESS. CAUTION OF SEDATION Patient not taking: Reported on 02/28/2023 08/23/22   [name], MD    Current Outpatient Medications  Medication Sig Dispense Refill   amLODipine (NORVASC) 10 MG tablet Take 1 tablet (10 mg total) by mouth daily. 90 tablet 3   Ascorbic Acid (VITAMIN C) 1000 MG tablet Take 1,000 mg by mouth daily with breakfast.     aspirin EC 81 MG tablet Take 1 tablet (81 mg total) by mouth 2 (two) times daily. To prevent blood clots for 30 days after surgery. 60 tablet 0   cetirizine (ZYRTEC) 10 MG tablet Take 10 mg by mouth daily.     cholecalciferol (VITAMIN D) 1000 UNITS tablet Take 1,000 Units by mouth daily with breakfast.      fluticasone (FLONASE) 50 MCG/ACT nasal spray Place 2 sprays into both nostrils at bedtime.  furosemide (LASIX) 20 MG tablet MAY TAKE 1 TABLET UP TO THREE TIMES A WEEK. DO NOT TAKE HCTZ THAT DAY. 30 tablet 11    hydrochlorothiazide (HYDRODIURIL) 25 MG tablet Take 1 tablet (25 mg total) by mouth See admin instructions. Take 25 mg by mouth Sunday, Monday, Wednesday and Friday 90 tablet 2   lubiprostone (AMITIZA) 24 MCG capsule TAKE 1 CAPSULE (24 MCG TOTAL) BY MOUTH 2 (TWO) TIMES DAILY. LUNCH & SUPPER 180 capsule 3   meloxicam (MOBIC) 7.5 MG tablet TAKE 1 TABLET BY MOUTH AT BEDTIME. 90 tablet 0   metoprolol succinate (TOPROL-XL) 25 MG 24 hr tablet TAKE 1 TABLET (25 MG TOTAL) BY MOUTH DAILY. 90 tablet 2   Multiple Vitamin (MULTI-VITAMIN) tablet Take by mouth.     mupirocin ointment (BACTROBAN) 2 % Apply 1 Application topically 2 (two) times daily. To wound 22 g 0   NEXIUM 40 MG capsule Take 40 mg by mouth in the morning.     Polyethyl Glycol-Propyl Glycol 0.4-0.3 % SOLN Place 1-2 drops into both eyes at bedtime.     potassium chloride (KLOR-CON) 10 MEQ tablet TAKE 2 TABLETS (20 MEQ TOTAL) BY MOUTH 2 (TWO) TIMES DAILY. 360 tablet 0   Simethicone 180 MG CAPS Take by mouth.     sodium chloride (OCEAN) 0.65 % nasal spray Place 1 spray into the nose daily.     triamcinolone cream (KENALOG) 0.1 % Apply topically 2 (two) times daily. To affected area/ rash 30 g 0   venlafaxine XR (EFFEXOR-XR) 75 MG 24 hr capsule TAKE 1 CAPSULE (75 MG TOTAL) BY MOUTH DAILY AFTER BREAKFAST. 90 capsule 3   Black Pepper-Turmeric (TURMERIC COMPLEX/BLACK PEPPER PO) Take 15 mLs by mouth daily with breakfast.  (Patient not taking: Reported on 02/28/2023)     Calcium-Phosphorus-Vitamin D (CALCIUM/VITAMIN D3/ADULT GUMMY PO) Take 1 tablet by mouth 2 (two) times daily.  (Patient not taking: Reported on 02/28/2023)     meclizine (ANTIVERT) 25 MG tablet TAKE 1/2 TO 1 TABLET BY MOUTH 3 TIMES DAILY AS NEEDED FOR DIZZINESS. CAUTION OF SEDATION (Patient not taking: Reported on 02/28/2023) 20 tablet 0   Current Facility-Administered Medications  Medication Dose Route Frequency Provider Last Rate Last Admin   0.9 %  sodium chloride infusion  500 mL  Intravenous Once Iva Boop, MD        Allergies as of 02/28/2023 - Review Complete 02/28/2023  Allergen Reaction Noted   Clarithromycin Swelling and Other (See Comments) 08/26/2007   Penicillins Hives and Other (See Comments) 08/26/2007   Other  05/14/2022   Codeine Nausea And Vomiting 08/26/2007    Family History  Problem Relation Age of Onset   Kidney disease Mother    Alzheimer's disease Mother    Stroke Father 7   Colon cancer Father 37       possibly colon cancer, possibly just polyps   Kidney disease Sister    Heart attack Sister    Colon cancer Paternal Aunt 35   Cancer Paternal Uncle        unk type   Esophageal cancer Neg Hx    Stomach cancer Neg Hx    Rectal cancer Neg Hx    Breast cancer Neg Hx    Ovarian cancer Neg Hx    Endometrial cancer Neg Hx    Pancreatic cancer Neg Hx    Prostate cancer Neg Hx    Colon polyps Neg Hx     Social History   Socioeconomic History  Marital status: Widowed    Spouse name: Not on file   Number of children: 2   Years of education: Not on file   Highest education level: High school graduate  Occupational History   Occupation: RECEPTION    Employer: [employer]    Comment: Retired; Theme park manager reception  Tobacco Use   Smoking status: Never    Passive exposure: Past   Smokeless tobacco: Never  Vaping Use   Vaping status: Never Used  Substance and Sexual Activity   Alcohol use: No    Alcohol/week: 0.0 standard drinks of alcohol   Drug use: No   Sexual activity: Yes    Birth control/protection: Post-menopausal  Other Topics Concern   Not on file  Social History Narrative   She is relatively recently remarried.  She has 2  children and 3 grandchildren.     She is currently retired.  But enjoys cleaning house and doing chores.  She likes to do yard work.  Does not routinely exercise.   Social Determinants of Health   Financial Resource Strain: Low Risk  (12/18/2022)   Overall Financial Resource Strain  (CARDIA)    Difficulty of Paying Living Expenses: Not hard at all  Food Insecurity: No Food Insecurity (12/18/2022)   Hunger Vital Sign    Worried About Running Out of Food in the Last Year: Never true    Ran Out of Food in the Last Year: Never true  Transportation Needs: No Transportation Needs (12/18/2022)   PRAPARE - Administrator, Civil Service (Medical): No    Lack of Transportation (Non-Medical): No  Physical Activity: Inactive (12/18/2022)   Exercise Vital Sign    Days of Exercise per Week: 0 days    Minutes of Exercise per Session: 0 min  Stress: No Stress Concern Present (12/18/2022)   [employer] of Occupational Health - Occupational Stress Questionnaire    Feeling of Stress : Not at all  Social Connections: Moderately Integrated (12/18/2022)   Social Connection and Isolation Panel [NHANES]    Frequency of Communication with Friends and Family: More than three times a week    Frequency of Social Gatherings with Friends and Family: More than three times a week    Attends Religious Services: 1 to 4 times per year    Active Member of [employer] or Organizations: No    Attends Banker Meetings: Never    Marital Status: Married  Catering manager Violence: Not At Risk (12/18/2022)   Humiliation, Afraid, Rape, and Kick questionnaire    Fear of Current or Ex-Partner: No    Emotionally Abused: No    Physically Abused: No    Sexually Abused: No    Review of Systems:  All other review of systems negative except as mentioned in the HPI.  Physical Exam: Vital signs BP (!) 152/71   Pulse 70   Temp 97.8 F (36.6 C) (Skin)   Ht 5\' 6"  (1.676 m)   Wt 166 lb (75.3 kg)   SpO2 97%   BMI 26.79 kg/m   General:   Alert,  Well-developed, well-nourished, pleasant and cooperative in NAD Lungs:  Clear throughout to auscultation.   Heart:  Regular rate and rhythm; no murmurs, clicks, rubs,  or gallops. Abdomen:  Soft, nontender and nondistended. Normal bowel  sounds.   Neuro/Psych:  Alert and cooperative. Normal mood and affect. A and O x 3   @Chiniqua Kilcrease  Sena Slate, MD, [facility] [phone] (pager) 02/28/2023 9:40 AM@

## 2023-02-28 NOTE — Progress Notes (Signed)
Vss nad trans to pacu 

## 2023-02-28 NOTE — Progress Notes (Signed)
Pt's states no medical or surgical changes since previsit or office visit. VS assessed by C.W 

## 2023-02-28 NOTE — Op Note (Signed)
[facility] Patient Name: Veronica Greene Procedure Date: 02/28/2023 9:35 AM MRN: [id number] Endoscopist: Iva Boop , MD, [id number] Age: 77 Referring MD:  Date of Birth: 1946/06/25 Gender: Female Account #: [id number] Procedure:                Colonoscopy Indications:              Surveillance: Personal history of adenomatous                            polyps on last colonoscopy 3 years ago, Last                            colonoscopy: 2021 Medicines:                Monitored Anesthesia Care Procedure:                Pre-Anesthesia Assessment:                           - Prior to the procedure, a History and Physical                            was performed, and patient medications and                            allergies were reviewed. The patient's tolerance of                            previous anesthesia was also reviewed. The risks                            and benefits of the procedure and the sedation                            options and risks were discussed with the patient.                            All questions were answered, and informed consent                            was obtained. Prior Anticoagulants: The patient has                            taken no anticoagulant or antiplatelet agents. ASA                            Grade Assessment: III - A patient with severe                            systemic disease. After reviewing the risks and                            benefits, the patient was deemed in satisfactory  condition to undergo the procedure.                           After obtaining informed consent, the colonoscope                            was passed under direct vision. Throughout the                            procedure, the patient's blood pressure, pulse, and                            oxygen saturations were monitored continuously. The                            CF [id number] #[id number] was introduced through the  anus                            and advanced to the the cecum, identified by                            appendiceal orifice and ileocecal valve. The                            colonoscopy was performed without difficulty. The                            patient tolerated the procedure well. The quality                            of the bowel preparation was good. The ileocecal                            valve, appendiceal orifice, and rectum were                            photographed. Scope In: 9:52:37 AM Scope Out: 10:09:44 AM Scope Withdrawal Time: 0 hours 10 minutes 45 seconds  Total Procedure Duration: 0 hours 17 minutes 7 seconds  Findings:                 The perianal and digital rectal examinations were                            normal.                           Three sessile polyps were found in the sigmoid                            colon and descending colon. The polyps were                            diminutive in size. These polyps were removed with  a cold biopsy forceps. Resection and retrieval were                            complete. Verification of patient identification                            for the specimen was done. Estimated blood loss was                            minimal.                           A 5 mm polyp was found in the rectum. The polyp was                            sessile. The polyp was removed with a cold snare.                            Resection and retrieval were complete. Verification                            of patient identification for the specimen was done.                           The exam was otherwise without abnormality on                            direct and retroflexion views. Complications:            No immediate complications. Estimated Blood Loss:     Estimated blood loss was minimal. Impression:               - Three diminutive polyps in the sigmoid colon and                            in the  descending colon, removed with a cold biopsy                            forceps. Resected and retrieved.                           - One 5 mm polyp in the rectum, removed with a cold                            snare. Resected and retrieved.                           - The examination was otherwise normal on direct                            and retroflexion views. + tattoo in ascending colon                            from prior polypectomy (no residual polyp)  Recommendation:           - Patient has a contact number available for                            emergencies. The signs and symptoms of potential                            delayed complications were discussed with the                            patient. Return to normal activities tomorrow.                            Written discharge instructions were provided to the                            patient.                           - Continue present medications.                           - Await pathology results.                           - No repeat colonoscopy.                           - Await pathology results. Iva Boop, MD 02/28/2023 10:22:39 AM This report has been signed electronically.

## 2023-02-28 NOTE — Patient Instructions (Addendum)
I removed 4 tiny polyps.  They will be analyzed.  Last routine colonoscopy, I think.  I appreciate the opportunity to care for you. Iva Boop, MD, [facility]  Handouts provided on polyps.  Resume previous diet.  Continue present medications.  Await pathology results.  No repeat colonoscopy.   YOU HAD AN ENDOSCOPIC PROCEDURE TODAY AT THE [facility]:   Refer to the procedure report that was given to you for any specific questions about what was found during the examination.  If the procedure report does not answer your questions, please call your gastroenterologist to clarify.  If you requested that your care partner not be given the details of your procedure findings, then the procedure report has been included in a sealed envelope for you to review at your convenience later.  YOU SHOULD EXPECT: Some feelings of bloating in the abdomen. Passage of more gas than usual.  Walking can help get rid of the air that was put into your GI tract during the procedure and reduce the bloating. If you had a lower endoscopy (such as a colonoscopy or flexible sigmoidoscopy) you may notice spotting of blood in your stool or on the toilet paper. If you underwent a bowel prep for your procedure, you may not have a normal bowel movement for a few days.  Please Note:  You might notice some irritation and congestion in your nose or some drainage.  This is from the oxygen used during your procedure.  There is no need for concern and it should clear up in a day or so.  SYMPTOMS TO REPORT IMMEDIATELY:  Following lower endoscopy (colonoscopy or flexible sigmoidoscopy):  Excessive amounts of blood in the stool  Significant tenderness or worsening of abdominal pains  Swelling of the abdomen that is new, acute  Fever of 100F or higher  For urgent or emergent issues, a gastroenterologist can be reached at any hour by calling [phone]. Do not use MyChart messaging for urgent concerns.    DIET:   We do recommend a small meal at first, but then you may proceed to your regular diet.  Drink plenty of fluids but you should avoid alcoholic beverages for 24 hours.  ACTIVITY:  You should plan to take it easy for the rest of today and you should NOT DRIVE or use heavy machinery until tomorrow (because of the sedation medicines used during the test).    FOLLOW UP: Our staff will call the number listed on your records the next business day following your procedure.  We will call around 7:15- 8:00 am to check on you and address any questions or concerns that you may have regarding the information given to you following your procedure. If we do not reach you, we will leave a message.     If any biopsies were taken you will be contacted by phone or by letter within the next 1-3 weeks.  Please call us at [phone] if you have not heard about the biopsies in 3 weeks.    SIGNATURES/CONFIDENTIALITY: You and/or your care partner have signed paperwork which will be entered into your electronic medical record.  These signatures attest to the fact that that the information above on your After Visit Summary has been reviewed and is understood.  Full responsibility of the confidentiality of this discharge information lies with you and/or your care-partner.

## 2023-02-28 NOTE — Telephone Encounter (Signed)
Pt called c/o bleeding per rectum after colonoscopy with polyps removed from the sigmoid and rectum. Pt states it's small amounts on the tissue when she wipes and a few small drops in the toilet with one small clot as well. Instructed pt that this is not abnormal post polypectomy and that the procedure has only been finished a couple of hours. Instructed pt to give it a little more time today to see if the bleeding subsides, but to definitely call back if it worsens or persists. Pt verbalized understanding and is agreeable to plan of care.

## 2023-03-01 ENCOUNTER — Telehealth: Payer: Self-pay

## 2023-03-01 NOTE — Telephone Encounter (Signed)
Returned pts call.  She states that she had bleeding yesterday but only a small amount today.  She feels like this has stopped completely. She is not experiencing any pain.   I advised her if she starts bleeding again and sees a large amount to call the number on her discharge instructions.

## 2023-03-01 NOTE — Telephone Encounter (Signed)
  Follow up Call-     02/28/2023    8:53 AM  Call back number  Post procedure Call Back phone  # [phone]  Permission to leave phone message Yes     Left message

## 2023-03-01 NOTE — Telephone Encounter (Signed)
PT returning call. She feels better but she will still like to speak with nurse. Please advise. Thank you

## 2023-03-05 ENCOUNTER — Encounter: Payer: Self-pay | Admitting: Internal Medicine

## 2023-03-05 DIAGNOSIS — Z860100 Personal history of colon polyps, unspecified: Secondary | ICD-10-CM

## 2023-03-12 ENCOUNTER — Other Ambulatory Visit: Payer: Self-pay | Admitting: Family Medicine

## 2023-03-13 NOTE — Telephone Encounter (Signed)
Last filled on 12/06/22 #90 tabs/ 0 refill, CPE scheduled 12/17/22

## 2023-04-04 ENCOUNTER — Other Ambulatory Visit (HOSPITAL_COMMUNITY): Payer: Self-pay | Admitting: Orthopedic Surgery

## 2023-04-04 ENCOUNTER — Ambulatory Visit (HOSPITAL_COMMUNITY)
Admission: RE | Admit: 2023-04-04 | Discharge: 2023-04-04 | Disposition: A | Discharge status: Home / Self Care | Payer: PPO | Source: Ambulatory Visit | Attending: Vascular Surgery | Admitting: Vascular Surgery

## 2023-04-04 DIAGNOSIS — R52 Pain, unspecified: Secondary | ICD-10-CM

## 2023-04-05 DIAGNOSIS — S81801D Unspecified open wound, right lower leg, subsequent encounter: Secondary | ICD-10-CM | POA: Diagnosis not present

## 2023-04-26 DIAGNOSIS — H6123 Impacted cerumen, bilateral: Secondary | ICD-10-CM | POA: Diagnosis not present

## 2023-04-26 DIAGNOSIS — H9313 Tinnitus, bilateral: Secondary | ICD-10-CM | POA: Diagnosis not present

## 2023-04-26 DIAGNOSIS — R42 Dizziness and giddiness: Secondary | ICD-10-CM | POA: Diagnosis not present

## 2023-04-26 DIAGNOSIS — R053 Chronic cough: Secondary | ICD-10-CM | POA: Diagnosis not present

## 2023-04-30 ENCOUNTER — Ambulatory Visit: Payer: PPO | Attending: Cardiology | Admitting: Cardiology

## 2023-04-30 ENCOUNTER — Ambulatory Visit (INDEPENDENT_AMBULATORY_CARE_PROVIDER_SITE_OTHER): Payer: PPO

## 2023-04-30 ENCOUNTER — Encounter: Payer: Self-pay | Admitting: Cardiology

## 2023-04-30 VITALS — BP 132/54 | HR 66 | Ht 66.0 in | Wt 169.8 lb

## 2023-04-30 DIAGNOSIS — R600 Localized edema: Secondary | ICD-10-CM | POA: Diagnosis not present

## 2023-04-30 DIAGNOSIS — I10 Essential (primary) hypertension: Secondary | ICD-10-CM

## 2023-04-30 DIAGNOSIS — R002 Palpitations: Secondary | ICD-10-CM

## 2023-04-30 DIAGNOSIS — E785 Hyperlipidemia, unspecified: Secondary | ICD-10-CM | POA: Diagnosis not present

## 2023-04-30 DIAGNOSIS — Z952 Presence of prosthetic heart valve: Secondary | ICD-10-CM | POA: Diagnosis not present

## 2023-04-30 DIAGNOSIS — R2689 Other abnormalities of gait and mobility: Secondary | ICD-10-CM | POA: Diagnosis not present

## 2023-04-30 NOTE — Assessment & Plan Note (Signed)
Stable on current regimen of Toprol 0.5mg  and Amlodipine 10mg . -Continue current medications.

## 2023-04-30 NOTE — Assessment & Plan Note (Signed)
Thankfully, no falls.  Balance is doing better, but she still has some weakness with the right knee after surgery.  Now uses a cane for walking outside of the house.

## 2023-04-30 NOTE — Progress Notes (Signed)
Cardiology Office Note:  .   Date:  04/30/2023  ID:  Naryah, Platt 03/17/1946, MRN [id number] PCP: [name], MD  [facility] Providers Cardiologist:  Bryan Lemma, MD     Chief Complaint  Patient presents with   Follow-up    69-month follow-up.  Doing well.   Cardiac Valve Problem    History of TAVR-due for follow-up echo   Patient Profile: .     Veronica Greene is a 77 y.o. female with a PMH notable for h/o Severe Calcific Aortic Stenosis s/p TAVR (on Tober 2020), HTN, who presents here for 56-month follow-up at the request of Tower, Audrie Gallus, MD. Other PMH notable for GERD, fibromyalgia, history of endometrial cancer, postpolio syndrome with depression/anxiety.   Veronica Greene was last seen on 11/06/2022 by Bernadene Person, NP  Subjective  ER visit 12/26/2022 for right leg skin tear  Discussed the use of AI scribe software for clinical note transcription with the patient, who gave verbal consent to proceed.  History of Present Illness   The patient, with a history of TAVR for Severe AS, presents with a new complaint of nocturnal chest fluttering. The sensation, described as a quiver, is localized to the upper right chest and occurs almost nightly. The episodes are brief, lasting approximately 15 minutes, and do not cause any associated symptoms such as dizziness or lightheadedness. The patient denies any daytime occurrences of this sensation.  The patient also reports occasional epigastric discomfort, which she attributes to dietary intake. She denies any chest pain or shortness of breath during daily activities. She has been managing well post-knee replacement surgery, using a cane for stability due to fear of falling.  The patient has been alternating between HCTZ and furosemide for management of leg swelling, which she reports as variable. She is also on Toprol and amlodipine for blood pressure control. The patient denies any bloody stools, dark tar  stools, or blood in urine.  She recently underwent a colonoscopy, during which polyps were removed and sent for biopsy. The results were reassuring, with no evidence of malignancy. - no requirement for further testing.   The patient's last echocardiogram was in December of the previous year, showing a mild leak in the aortic valve.  Due for f/u this Nov-Dec.  No Fevers, chills, night sweats.     Cardiovascular ROS: no chest pain or dyspnea on exertion positive for - edema, palpitations, and well-controlled edema with the combination of HCTZ and furosemide alternating doses. negative for - irregular heartbeat, orthopnea, paroxysmal nocturnal dyspnea, rapid heart rate, shortness of breath, or lightheadedness, dizziness or wooziness, syncope or near syncope, TIA or amaurosis fugax, claudication.  Melena, hematochezia, hematuria, epistaxis.  ROS:  Review of Systems - Negative except she still has a pain in the right knee and has had some swelling since her surgery.  Was just on antibiotics.  Now seems to be improving.  Walks with a chronic limp due to postpolio syndrome.    Objective   Studies Reviewed: Marland Kitchen   EKG Interpretation Date/Time:  Tuesday April 30 2023 09:38:08 EDT Ventricular Rate:  66 PR Interval:  184 QRS Duration:  78 QT Interval:  446 QTC Calculation: 467 R Axis:   14  Text Interpretation: Normal sinus rhythm Low voltage QRS Septal infarct , age undetermined When compared with ECG of 29-Apr-2019 03:16, Premature atrial complexes are no longer Present Confirmed by Bryan Lemma ([id number]) on 04/30/2023 9:59:58 AM    Results LABS Creatinine:  0.6 (11/2022) Hemoglobin: 12.3 (11/2022) Potassium: 3.8 (11/2022) A1c: 5 (11/2022) Total Cholesterol: 175 (11/2022) Triglycerides: 125 (11/2022) HDL: 50 (11/2022) LDL: 99 (11/2022)  DIAGNOSTIC Colonoscopy: Polyps (02/2023)  ECHO 06/27/2022: EF 70 to 75%.  Hyperdynamic LV with normal RWMA.  GR 2 DD.  Elevated LVEDP with severe LA  dilation and moderate RA dilation.  Severe MAC.  Stable 23 mm SAPIEN TAVR valve with unchanged gradients of 05/2021 (mean AVG 17 mmHg). Pre-TAVR CATH 04/2019: RHC showed mild hypertension tortuous. Lower extremity DVT Dopplers 04/04/2023: No evidence of DVT in the right lower extremity  Risk Assessment/Calculations:             Physical Exam:   VS:  BP (!) 132/54 (BP Location: Left Arm, Patient Position: Sitting, Cuff Size: Normal)   Pulse 66   Ht 5\' 6"  (1.676 m)   Wt 169 lb 12.8 oz (77 kg)   SpO2 93%   BMI 27.41 kg/m    Wt Readings from Last 3 Encounters:  04/30/23 169 lb 12.8 oz (77 kg)  02/28/23 166 lb (75.3 kg)  02/21/23 169 lb 3.2 oz (76.7 kg)    GEN: Well nourished, well groomed in no acute distress; healthy appearing NECK: No JVD; No carotid bruits CARDIAC: Normal S1, S2; RRR, no murmurs, rubs, gallops RESPIRATORY:  Clear to auscultation without rales, wheezing or rhonchi ; nonlabored, good air movement. ABDOMEN: Soft, non-tender, non-distended EXTREMITIES:  No edema; No deformity ; R leg shorter - with shoe prosthesis to adjust height.  Knee Sgx scar well healed.      ASSESSMENT AND PLAN: .    Problem List Items Addressed This Visit       Cardiology Problems   Essential hypertension (Chronic)    Stable on current regimen of Toprol 0.5mg  and Amlodipine 10mg . -Continue current medications.      Relevant Orders   EKG 12-Lead (Completed)   Hyperlipidemia with target LDL less than 100 (Chronic)    Most recent LDL was 99 on no medications.  Continue to monitor.      Relevant Orders   EKG 12-Lead (Completed)     Other   Lower extremity edema (Chronic)    Intermittent, managed with alternating HCTZ and Furosemide. -Continue current regimen.      Palpitations    Chest Fluttering / ? Palpitations New onset, nocturnal, lasting approximately 15 minutes. No associated chest pain, shortness of breath, or dizziness. Likely musculoskeletal or nerve-related given  location and presentation but need to rule out cardiac arrhythmia due to patient's history of TAVR. -Order 7-day cardiac monitor to rule out arrhythmia.      Relevant Orders   ECHOCARDIOGRAM COMPLETE   LONG TERM MONITOR (3-14 DAYS)   Poor balance (Chronic)    Thankfully, no falls.  Balance is doing better, but she still has some weakness with the right knee after surgery.  Now uses a cane for walking outside of the house.      S/P TAVR (transcatheter aortic valve replacement) - Primary (Chronic)    s/p Transcatheter Aortic Valve Replacement (TAVR) with a 23mm Edwards S3U via the TF approach by Dr. Laneta Simmers and Dr. Clifton James 04/2019 => for severe calcific aortic stenosis.  Stable with last echo in December 2023 showing mild leak and good gradients. No symptoms of heart failure or valve dysfunction. -Order follow-up echocardiogram in November/December 2024. -Remind patient and other healthcare providers about the need for prophylactic antibiotics prior to any dental or GI procedures to prevent spontaneous bacterial endocarditis.  Relevant Orders   EKG 12-Lead (Completed)   ECHOCARDIOGRAM COMPLETE      Follow-up: Return in about 6 months (around 10/29/2023) for Alternate 6 month follow-up with APP & MD, Routine follow up with me. -See Irving Burton in 6 months. -Return to clinic in 1 year unless abnormal findings on cardiac monitor.   Total time spent: 21 min spent with patient + 18 min spent charting = 39 min      Signed, Marykay Lex, MD, MS Bryan Lemma, M.D., M.S. Interventional Cardiologist  [facility]  Pager # [phone] Phone # [phone] [address] [place], [place] [address]

## 2023-04-30 NOTE — Assessment & Plan Note (Signed)
Most recent LDL was 99 on no medications.  Continue to monitor.

## 2023-04-30 NOTE — Assessment & Plan Note (Signed)
s/p Transcatheter Aortic Valve Replacement (TAVR) with a 23mm Edwards S3U via the TF approach by Dr. Laneta Simmers and Dr. Clifton James 04/2019 => for severe calcific aortic stenosis.  Stable with last echo in December 2023 showing mild leak and good gradients. No symptoms of heart failure or valve dysfunction. -Order follow-up echocardiogram in November/December 2024. -Remind patient and other healthcare providers about the need for prophylactic antibiotics prior to any dental or GI procedures to prevent spontaneous bacterial endocarditis.

## 2023-04-30 NOTE — Progress Notes (Unsigned)
Enrolled for [employer] to mail a ZIO XT long term holter monitor to the patients address on file.  

## 2023-04-30 NOTE — Patient Instructions (Addendum)
Other Instructions  In the future if you have any type of dental or GI procedures you will need antibiotics please have provider to contact our office    Medication Instructions:  No change  *If you need a refill on your cardiac medications before your next appointment, please call your pharmacy*   Lab Work: No change  If you have labs (blood work) drawn today and your tests are completely normal, you will receive your results only by: MyChart Message (if you have MyChart) OR A paper copy in the mail If you have any lab test that is abnormal or we need to change your treatment, we will call you to review the results.   Testing/Procedures: Your physician has requested that you have an echocardiogram. Echocardiography is a painless test that uses sound waves to create images of your heart. It provides your doctor with information about the size and shape of your heart and how well your heart's chambers and valves are working. This procedure takes approximately one hour. There are no restrictions for this procedure. Please do NOT wear cologne, perfume, aftershave, or lotions (deodorant is allowed). Please arrive 15 minutes prior to your appointment time.    Your physician has recommended that you wear a holter monitor.  7 day zio Holter monitors are medical devices that record the heart's electrical activity. Doctors most often use these monitors to diagnose arrhythmias. Arrhythmias are problems with the speed or rhythm of the heartbeat. The monitor is a small, portable device. You can wear one while you do your normal daily activities. This is usually used to diagnose what is causing palpitations/syncope (passing out).    Follow-Up: At [facility], you and your health needs are our priority.  As part of our continuing mission to provide you with exceptional heart care, we have created designated Provider Care Teams.  These Care Teams include your primary Cardiologist (physician)  and Advanced Practice Providers (APPs -  Physician Assistants and Nurse Practitioners) who all work together to provide you with the care you need, when you need it.  We recommend signing up for the patient portal called "MyChart".  Sign up information is provided on this After Visit Summary.  MyChart is used to connect with patients for Virtual Visits (Telemedicine).  Patients are able to view lab/test results, encounter notes, upcoming appointments, etc.  Non-urgent messages can be sent to your provider as well.   To learn more about what you can do with MyChart, go to [email or web address].    Your next appointment:   6 month(s)  Provider:   Bernadene Person, NP    Then, Veronica Lemma, MD will plan to see you again in 1 year(s).    Other Instructions  In the future if you have any type of dental or GI procedures you will need antibiotics please have provider to contact our office  ZIO XT- Long Term Monitor Instructions  Your physician has requested you wear a ZIO patch monitor for 7 days.  This is a single patch monitor. [employer] supplies one patch monitor per enrollment. Additional stickers are not available. Please do not apply patch if you will be having a Nuclear Stress Test,  Echocardiogram, Cardiac CT, MRI, or Chest Xray during the period you would be wearing the  monitor. The patch cannot be worn during these tests. You cannot remove and re-apply the  ZIO XT patch monitor.  Your ZIO patch monitor will be mailed 3 day USPS to your address on file. It  may take 3-5 days  to receive your monitor after you have been enrolled.  Once you have received your monitor, please review the enclosed instructions. Your monitor  has already been registered assigning a specific monitor serial # to you.  Billing and Patient Assistance Program Information  We have supplied [employer] with any of your insurance information on file for billing purposes. [employer] offers a sliding scale Patient Assistance  Program for patients that do not have  insurance, or whose insurance does not completely cover the cost of the ZIO monitor.  You must apply for the Patient Assistance Program to qualify for this discounted rate.  To apply, please call [employer] at [phone], select option 4, select option 2, ask to apply for  Patient Assistance Program. Meredeth Ide will ask your household income, and how many people  are in your household. They will quote your out-of-pocket cost based on that information.  [employer] will also be able to set up a 39-month, interest-free payment plan if needed.  Applying the monitor   Shave hair from upper left chest.  Hold abrader disc by orange tab. Rub abrader in 40 strokes over the upper left chest as  indicated in your monitor instructions.  Clean area with 4 enclosed alcohol pads. Let dry.  Apply patch as indicated in monitor instructions. Patch will be placed under collarbone on left  side of chest with arrow pointing upward.  Rub patch adhesive wings for 2 minutes. Remove white label marked "1". Remove the white  label marked "2". Rub patch adhesive wings for 2 additional minutes.  While looking in a mirror, press and release button in center of patch. A small green light will  flash 3-4 times. This will be your only indicator that the monitor has been turned on.  Do not shower for the first 24 hours. You may shower after the first 24 hours.  Press the button if you feel a symptom. You will hear a small click. Record Date, Time and  Symptom in the Patient Logbook.  When you are ready to remove the patch, follow instructions on the last 2 pages of Patient  Logbook. Stick patch monitor onto the last page of Patient Logbook.  Place Patient Logbook in the blue and white box. Use locking tab on box and tape box closed  securely. The blue and white box has prepaid postage on it. Please place it in the mailbox as  soon as possible. Your physician should have your test results  approximately 7 days after the  monitor has been mailed back to [facility].  Call [facility] Customer Care at [phone] if you have questions regarding  your ZIO XT patch monitor. Call them immediately if you see an orange light blinking on your  monitor.  If your monitor falls off in less than 4 days, contact our Monitor department at [phone].  If your monitor becomes loose or falls off after 4 days call [employer] at [phone] for  suggestions on securing your monitor

## 2023-04-30 NOTE — Assessment & Plan Note (Signed)
Chest Fluttering / ? Palpitations New onset, nocturnal, lasting approximately 15 minutes. No associated chest pain, shortness of breath, or dizziness. Likely musculoskeletal or nerve-related given location and presentation but need to rule out cardiac arrhythmia due to patient's history of TAVR. -Order 7-day cardiac monitor to rule out arrhythmia.

## 2023-04-30 NOTE — Assessment & Plan Note (Signed)
Intermittent, managed with alternating HCTZ and Furosemide. -Continue current regimen.

## 2023-05-02 DIAGNOSIS — L570 Actinic keratosis: Secondary | ICD-10-CM | POA: Diagnosis not present

## 2023-05-02 DIAGNOSIS — L820 Inflamed seborrheic keratosis: Secondary | ICD-10-CM | POA: Diagnosis not present

## 2023-05-02 DIAGNOSIS — I872 Venous insufficiency (chronic) (peripheral): Secondary | ICD-10-CM | POA: Diagnosis not present

## 2023-05-02 DIAGNOSIS — L538 Other specified erythematous conditions: Secondary | ICD-10-CM | POA: Diagnosis not present

## 2023-05-02 DIAGNOSIS — L821 Other seborrheic keratosis: Secondary | ICD-10-CM | POA: Diagnosis not present

## 2023-05-03 DIAGNOSIS — R002 Palpitations: Secondary | ICD-10-CM | POA: Diagnosis not present

## 2023-05-16 DIAGNOSIS — R002 Palpitations: Secondary | ICD-10-CM | POA: Diagnosis not present

## 2023-06-03 DIAGNOSIS — S7011XA Contusion of right thigh, initial encounter: Secondary | ICD-10-CM | POA: Diagnosis not present

## 2023-06-03 DIAGNOSIS — M25572 Pain in left ankle and joints of left foot: Secondary | ICD-10-CM | POA: Diagnosis not present

## 2023-06-04 ENCOUNTER — Ambulatory Visit (HOSPITAL_COMMUNITY): Payer: PPO | Attending: Cardiology

## 2023-06-04 DIAGNOSIS — R002 Palpitations: Secondary | ICD-10-CM | POA: Diagnosis not present

## 2023-06-04 DIAGNOSIS — Z952 Presence of prosthetic heart valve: Secondary | ICD-10-CM

## 2023-06-04 LAB — ECHOCARDIOGRAM COMPLETE
AR max vel: 1.03 cm2
AV Area VTI: 1.12 cm2
AV Area mean vel: 1.14 cm2
AV Mean grad: 16.5 mm[Hg]
AV Peak grad: 31.2 mm[Hg]
Ao pk vel: 2.8 m/s
Area-P 1/2: 2.01 cm2
MV VTI: 0.91 cm2
S' Lateral: 2.4 cm

## 2023-07-17 ENCOUNTER — Ambulatory Visit
Admission: RE | Admit: 2023-07-17 | Discharge: 2023-07-17 | Disposition: A | Discharge status: Home / Self Care | Payer: PPO | Source: Ambulatory Visit | Attending: Family Medicine | Admitting: Family Medicine

## 2023-07-17 ENCOUNTER — Other Ambulatory Visit: Payer: Self-pay | Admitting: Cardiology

## 2023-07-17 DIAGNOSIS — Z1231 Encounter for screening mammogram for malignant neoplasm of breast: Secondary | ICD-10-CM

## 2023-07-17 DIAGNOSIS — E2839 Other primary ovarian failure: Secondary | ICD-10-CM | POA: Diagnosis not present

## 2023-07-17 DIAGNOSIS — Z780 Asymptomatic menopausal state: Secondary | ICD-10-CM

## 2023-07-17 DIAGNOSIS — Z90722 Acquired absence of ovaries, bilateral: Secondary | ICD-10-CM | POA: Diagnosis not present

## 2023-07-17 DIAGNOSIS — N958 Other specified menopausal and perimenopausal disorders: Secondary | ICD-10-CM | POA: Diagnosis not present

## 2023-07-18 ENCOUNTER — Encounter: Payer: Self-pay | Admitting: *Deleted

## 2023-07-22 ENCOUNTER — Ambulatory Visit: Payer: PPO | Attending: Cardiology | Admitting: Cardiology

## 2023-07-22 ENCOUNTER — Encounter: Payer: Self-pay | Admitting: Cardiology

## 2023-07-22 VITALS — BP 140/70 | HR 81 | Ht 63.0 in | Wt 173.6 lb

## 2023-07-22 DIAGNOSIS — I10 Essential (primary) hypertension: Secondary | ICD-10-CM

## 2023-07-22 DIAGNOSIS — Z952 Presence of prosthetic heart valve: Secondary | ICD-10-CM | POA: Diagnosis not present

## 2023-07-22 DIAGNOSIS — R079 Chest pain, unspecified: Secondary | ICD-10-CM

## 2023-07-22 DIAGNOSIS — E785 Hyperlipidemia, unspecified: Secondary | ICD-10-CM | POA: Diagnosis not present

## 2023-07-22 DIAGNOSIS — I350 Nonrheumatic aortic (valve) stenosis: Secondary | ICD-10-CM

## 2023-07-22 DIAGNOSIS — I050 Rheumatic mitral stenosis: Secondary | ICD-10-CM

## 2023-07-22 DIAGNOSIS — R002 Palpitations: Secondary | ICD-10-CM

## 2023-07-22 MED ORDER — METOPROLOL SUCCINATE ER 50 MG PO TB24: 50.0000 mg | ORAL_TABLET | Freq: Every day | ORAL | 3 refills | Status: DC

## 2023-07-22 NOTE — Patient Instructions (Addendum)
 Medication Instructions:  INCREASE Metoprolol  Succinate (TOPROL  XL) to 50 mg at dinnertime If you have an episode of fast heart rate lasting 2-5 minutes, check Kardia mobile   *If you need a refill on your cardiac medications before your next appointment, please call your pharmacy*  Follow-Up: At [facility], you and your health needs are our priority.  As part of our continuing mission to provide you with exceptional heart care, we have created designated Provider Care Teams.  These Care Teams include your primary Cardiologist (physician) and Advanced Practice Providers (APPs -  Physician Assistants and Nurse Practitioners) who all work together to provide you with the care you need, when you need it.  We recommend signing up for the patient portal called MyChart.  Sign up information is provided on this After Visit Summary.  MyChart is used to connect with patients for Virtual Visits (Telemedicine).  Patients are able to view lab/test results, encounter notes, upcoming appointments, etc.  Non-urgent messages can be sent to your provider as well.   To learn more about what you can do with MyChart, go to [email or web address].    Your next appointment:   3-4 month(s)  Provider:   Alm Clay, MD     Other Instructions If you have an episode of fast heart rate lasting 2-5 minutes, check Kardia mobile   KardiaMobile [email or web address] , can purchase at [place], [employer], [employer], [employer], or [employer]        FDA-cleared, clinical grade mobile EKG monitor: Crist is the most clinically-validated mobile EKG used by the world's leading cardiac care medical professionals With Basic service, know instantly if your heart rhythm is normal or if atrial fibrillation is detected, and email the last single EKG recording to yourself or your doctor Premium service, available for purchase through the [place] app for $9.99 per month or $99 per year, includes  unlimited history and storage of your EKG recordings, a monthly EKG summary report to share with your doctor, along with the ability to track your blood pressure, activity and weight Includes one KardiaMobile phone clip FREE SHIPPING: Standard delivery 1-3 business days. Orders placed by 11:00am PST will ship that afternoon. Otherwise, will ship next business day. All orders ship via [employer] from [place], [place]    [employer] - sending an EKG Download app and set up profile. Run EKG - by placing 1-2 fingers on the silver plates After EKG is complete - Download PDF  - Skip password (if you apply a password the provider will need it to view the EKG) Click share button (square with upward arrow) in bottom left corner To send: choose MyChart (first time log into MyChart)  Pop up window about sending ECG Click continue Choose type of message Choose provider Type subject and message Click send (EKG should be attached)  - To send additional EKGs in one message click the paperclip image and bottom of page to attach.

## 2023-07-22 NOTE — Progress Notes (Signed)
 Cardiology Office Note:  .   Date:  07/25/2023  ID:  [name], DOB 1945/08/14, MRN [id number] PCP: Veronica Laine LABOR, MD  [facility] Providers Cardiologist:  Veronica Clay, MD     Chief Complaint  Patient presents with   Follow-up   Cardiac Valve Problem    Status post TAVR but echo with evidence of mitral stenosis    Patient Profile: .     Veronica Greene is a  78 y.o. female  with a PMH notable for h/o Severe Calcific Aortic Stenosis s/p TAVR (on Tober 2020), HTN, who presents here for 3 month f/u at the request of Tower, Laine LABOR, MD.  Other PMH notable for GERD, fibromyalgia, history of endometrial cancer, postpolio syndrome with depression/anxiety.     Veronica Greene was last seen on April 30, 2023 with a complaint of heart fluttering described as a somewhat brief quiver with episodes sometimes lasting to 15 minutes.  No other symptoms of dizziness or dyspnea.  Occasional epigastric discomfort due to different dietary intake but denies any chest pain or dyspnea with rest or exertion.  Walking with a cane following the surgery.  Swelling controlled with alternating her HCTZ and furosemide  dosing.  On stable dose of Toprol  and amlodipine . Routine echo ordered for follow-up along with Zio patch monitor  Subjective  Discussed the use of AI scribe software for clinical note transcription with the patient, who gave verbal consent to proceed.  History of Present Illness   The patient, with a history of heart fluttering and a recent TAVR valve replacement, presented for a follow-up consultation. The patient reported an increase in heart fluttering symptoms, which were previously monitored and found to be short bursts of tachycardia lasting between five to fifteen seconds. Despite these findings, the patient expressed dissatisfaction with the results, indicating a persistent concern about their symptoms.  An echocardiogram was performed, revealing a thick and  stiff heart, consistent with the patient's history of valve replacement. However, the patient's mitral valve was found to be heavily calcified and showing signs of narrowing, a new diagnosis of mitral stenosis. The patient's TAVR valve was reported to be functioning well.  The patient reported a recent episode of sharp chest pain, which was localized and occurred while at rest. The pain was described as unlike any previously experienced, causing the patient to remain still until it subsided. The patient denied experiencing any shortness of breath or limitations in breathing during physical activity.  The patient's blood pressure was noted to be slightly elevated, and the resting heart rate was 81 bpm. The patient's current medication regimen includes metoprolol , which the patient takes at night.  This recommendation was made in light of the patient's new diagnosis of mitral stenosis and the potential risk of developing atrial fibrillation. The patient expressed some confusion about the new diagnosis and the recommended monitoring, but agreed to consider the advice.      Cardiovascular ROS: positive for - chest pain, dyspnea on exertion, and palpitations negative for - edema, irregular heartbeat, orthopnea, paroxysmal nocturnal dyspnea, rapid heart rate, shortness of breath, or dizziness or wooziness, syncope or near syncope, TIA or amaurosis fugax, claudication.  Melena, hematochezia, hematuria, epistaxis  ROS:  Review of Systems - Negative except symptoms noted in HPI.  Still has her MSK pain.     Objective   Current Meds  Medication Sig   amLODipine  (NORVASC ) 10 MG tablet TAKE 1 TABLET (10 MG TOTAL) BY MOUTH DAILY.  Ascorbic Acid (VITAMIN C) 1000 MG tablet Take 1,000 mg by mouth daily with breakfast.   aspirin  EC 81 MG tablet Take 1 tablet (81 mg total) by mouth 2 (two) times daily. To prevent blood clots for 30 days after surgery.   Black Pepper-Turmeric (TURMERIC COMPLEX/BLACK PEPPER PO)  Take 15 mLs by mouth daily with breakfast.   cetirizine (ZYRTEC) 10 MG tablet Take 10 mg by mouth daily.   cholecalciferol (VITAMIN D ) 1000 UNITS tablet Take 1,000 Units by mouth daily with breakfast.    fluticasone  (FLONASE ) 50 MCG/ACT nasal spray Place 2 sprays into both nostrils at bedtime.    furosemide  (LASIX ) 20 MG tablet MAY TAKE 1 TABLET UP TO THREE TIMES A WEEK. DO NOT TAKE HCTZ THAT DAY.   hydrochlorothiazide  (HYDRODIURIL ) 25 MG tablet Take 1 tablet (25 mg total) by mouth See admin instructions. Take 25 mg by mouth Sunday, Monday, Wednesday and Friday   lubiprostone  (AMITIZA ) 24 MCG capsule TAKE 1 CAPSULE (24 MCG TOTAL) BY MOUTH 2 (TWO) TIMES DAILY. LUNCH & SUPPER   meclizine  (ANTIVERT ) 25 MG tablet TAKE 1/2 TO 1 TABLET BY MOUTH 3 TIMES DAILY AS NEEDED FOR DIZZINESS. CAUTION OF SEDATION   meloxicam  (MOBIC ) 7.5 MG tablet Take 1 tablet (7.5 mg total) by mouth at bedtime as needed for pain. With food   metoprolol  succinate (TOPROL -XL) 50 MG 24 hr tablet Take 1 tablet (50 mg total) by mouth daily after supper. Take with or immediately following a meal.   Multiple Vitamin (MULTI-VITAMIN) tablet Take 1 tablet by mouth daily.   NEXIUM  40 MG capsule Take 40 mg by mouth in the morning.   Polyethyl Glycol-Propyl Glycol 0.4-0.3 % SOLN Place 1-2 drops into both eyes at bedtime.   potassium chloride  (KLOR-CON ) 10 MEQ tablet TAKE 2 TABLETS BY MOUTH 2 TIMES DAILY.   Simethicone 180 MG CAPS Take 1 capsule by mouth as needed.   sodium chloride  (OCEAN) 0.65 % nasal spray Place 1 spray into the nose daily.   triamcinolone  cream (KENALOG ) 0.1 % Apply topically 2 (two) times daily. To affected area/ rash (Patient taking differently: Apply 1 Application topically as needed. To affected area/ rash)   venlafaxine  XR (EFFEXOR -XR) 75 MG 24 hr capsule TAKE 1 CAPSULE (75 MG TOTAL) BY MOUTH DAILY AFTER BREAKFAST.   [DISCONTINUED] metoprolol  succinate (TOPROL -XL) 25 MG 24 hr tablet TAKE 1 TABLET (25 MG TOTAL) BY MOUTH  DAILY.   Current Facility-Administered Medications for the 07/22/23 encounter (Office Visit) with Anner Veronica ORN, MD  Medication   0.9 %  sodium chloride  infusion     Studies Reviewed: SABRA        Lab Results  Component Value Date   CHOL 175 11/27/2022   HDL 50.50 11/27/2022   LDLCALC 99 11/27/2022   TRIG 125.0 11/27/2022   CHOLHDL 3 11/27/2022   Lab Results  Component Value Date   NA 139 11/27/2022   K 3.8 11/27/2022   CREATININE 0.60 11/27/2022   GFR 86.37 11/27/2022   GLUCOSE 99 11/27/2022   Lab Results  Component Value Date   HGBA1C 5.0 11/27/2022      Latest Ref Rng & Units 11/27/2022    9:08 AM 05/16/2022    9:53 AM 11/23/2021    8:53 AM  CBC  WBC 4.0 - 10.5 K/uL 4.2  4.4  4.0   Hemoglobin 12.0 - 15.0 g/dL 87.6  88.1  87.4   Hematocrit 36.0 - 46.0 % 35.8  35.0  36.0   Platelets 150.0 -  400.0 K/uL 238.0  221  197.0      ECHO 06/04/2023: Hyperdynamic LV with EF of 70 to 75%.  No RWMA.  Moderate concentric LVH.   Severe LA dilation.Unable to assess diastolic function.  Mildly elevated PAP at roughly 40 mmHg.  Mild MR but calcific moderate to severe MS-mean gradient 9.6 mmHg.  Well-positioned 23 mm TAVR valve.  Mean AVG 16.5 mmHg.  7-day Zio patch monitor (October 2024):    Predominant underlying rhythm is Sinus Rhythm with First AV Block   39 Atrial Runs: Fastest was 7 beats (3.1 seconds) with a rate range of 144 to 160 bpm and average of 151 bpm.  Longest was 21 beats (14.4 seconds) with a rate range of 74 to 97 bpm and an average of 88 bpm.   Isolated premature atrial contractions (PACs) were rare (<1.0%), PAC couplets and triplets were also rare Isolated premature ventricular contractions (PVCs) were rare (<1.0%), PVC couplets were rare (<1.0%),; Ventricular Bigeminy was present.   No Sustained Arrhythmias: Atrial Tachycardia (AT), Supraventricular Tachycardia (SVT), Atrial Fibrillation (A-Fib), Atrial Flutter (A-Flutter), Sustained Ventricular Tachycardia (VT)    No prolonged or symptomatic bradycardia, prolonged pauses or high-grade AV block noted.   33 triggers with 10 diary entries for PACs, PVCs and sinus rhythm, but not for atrial runs.     Overall relatively benign study.  No gross abnormalities noted.   Old Studies ECHO 06/27/2022: EF 70 to 75%.  Hyperdynamic LV with normal RWMA.  GR 2 DD.  Elevated LVEDP with severe LA dilation and moderate RA dilation.  Severe MAC.  Stable 23 mm SAPIEN TAVR valve with unchanged gradients of 05/2021 (mean AVG 17 mmHg). Pre-TAVR CATH 04/2019: L&RHC showed mild hypertension tortuous.  Risk Assessment/Calculations:     Physical Exam:   VS:  BP (!) 140/70   Pulse 81   Ht 5' 3 (1.6 m)   Wt 173 lb 9.6 oz (78.7 kg)   SpO2 99%   BMI 30.75 kg/m    Wt Readings from Last 3 Encounters:  07/22/23 173 lb 9.6 oz (78.7 kg)  04/30/23 169 lb 12.8 oz (77 kg)  02/28/23 166 lb (75.3 kg)    GEN: Well nourished, well developed in no acute distress; healthy-appearing NECK: No JVD; No carotid bruits CARDIAC: RRR, Normal S1, S2; 1/4 DM , rubs, gallops RESPIRATORY:  Clear to auscultation without rales, wheezing or rhonchi ; nonlabored, good air movement. ABDOMEN: Soft, non-tender, non-distended EXTREMITIES:  No edema; No deformity (right leg shorter than left, wears a shoe prosthesis).     ASSESSMENT AND PLAN: .    Problem List Items Addressed This Visit       Cardiology Problems   Essential hypertension (Chronic)   Somewhat elevated blood pressure.  I think she is a little bit stressed out . Currently on Toprol  25 mg daily along with HCTZ 25 mg and amlodipine  10 mg -Increase Toprol  to 50 mg daily      Relevant Medications   metoprolol  succinate (TOPROL -XL) 50 MG 24 hr tablet   Hyperlipidemia with target LDL less than 100 (Chronic)   Relevant Medications   metoprolol  succinate (TOPROL -XL) 50 MG 24 hr tablet   Moderate mitral stenosis by prior echocardiogram - Primary (Chronic)   Follow-up echo shows  progression of stenosis with now moderate to severe MS.  Thankfully, not really having that many symptoms as long as she remains in normal rhythm is not overly active-does have some exertional dyspnea but nothing significant. Would not be  a good candidate for operative therapy, and with significant calcification not likely a candidate for BAV.  At this point medical management with rate control is the only option. -Continue monitoring with yearly echocardiograms. -Increase Metoprolol  to slow heart rate, allowing more time for left ventricle to fill.      Relevant Medications   metoprolol  succinate (TOPROL -XL) 50 MG 24 hr tablet   Severe calcific aortic stenosis (Chronic)   Relevant Medications   metoprolol  succinate (TOPROL -XL) 50 MG 24 hr tablet     Other   Chest pain of uncertain etiology   One recent episode of sharp, localized chest pain, likely related to costochondritis. -Observe for recurrence.      Palpitations (Chronic)   Frequent palpitations, particularly at night.  No evidence of atrial fibrillation (AFib) on recent monitoring, but patient experiences frequent premature beats. -Increase Metoprolol  to 50mg , taken at dinnertime to coincide with symptom occurrence. -Consider purchase of KardiaMobile monitor for home monitoring of palpitations, particularly those lasting more than 2 minutes, given the need to be aggressive with maintaining/controlling potential for A-fib.      S/P TAVR (transcatheter aortic valve replacement) (Chronic)   Stable TAVR valve on echo.  Continue to monitor.         Follow-Up: Return in about 4 months (around 11/19/2023) for 3-4 month follow-up.to assess response to increased Metoprolol  dose and monitor for development of AFib or worsening mitral stenosis.      Signed, Veronica MICAEL Clay, MD, MS Veronica Greene, M.D., M.S. Interventional Cardiologist  [facility]  Pager # [phone] Phone # [phone] [address] [place], [place] [address]

## 2023-07-25 ENCOUNTER — Encounter: Payer: Self-pay | Admitting: Cardiology

## 2023-07-25 DIAGNOSIS — R079 Chest pain, unspecified: Secondary | ICD-10-CM | POA: Insufficient documentation

## 2023-07-25 NOTE — Assessment & Plan Note (Signed)
One recent episode of sharp, localized chest pain, likely related to costochondritis. -Observe for recurrence.

## 2023-07-25 NOTE — Assessment & Plan Note (Signed)
Frequent palpitations, particularly at night.  No evidence of atrial fibrillation (AFib) on recent monitoring, but patient experiences frequent premature beats. -Increase Metoprolol to 50mg , taken at dinnertime to coincide with symptom occurrence. -Consider purchase of KardiaMobile monitor for home monitoring of palpitations, particularly those lasting more than 2 minutes, given the need to be aggressive with maintaining/controlling potential for A-fib.

## 2023-07-25 NOTE — Assessment & Plan Note (Signed)
Somewhat elevated blood pressure.  I think she is a little bit stressed out . Currently on Toprol 25 mg daily along with HCTZ 25 mg and amlodipine 10 mg -Increase Toprol to 50 mg daily

## 2023-07-25 NOTE — Assessment & Plan Note (Signed)
Stable TAVR valve on echo.  Continue to monitor.

## 2023-07-25 NOTE — Assessment & Plan Note (Signed)
Follow-up echo shows progression of stenosis with now moderate to severe MS.  Thankfully, not really having that many symptoms as long as she remains in normal rhythm is not overly active-does have some exertional dyspnea but nothing significant. Would not be a good candidate for operative therapy, and with significant calcification not likely a candidate for BAV.  At this point medical management with rate control is the only option. -Continue monitoring with yearly echocardiograms. -Increase Metoprolol to slow heart rate, allowing more time for left ventricle to fill.

## 2023-08-28 ENCOUNTER — Telehealth: Payer: Self-pay

## 2023-08-28 NOTE — Telephone Encounter (Signed)
Contacted pt to relay information. Pt verbalized understanding and will call if symptoms develop.

## 2023-08-28 NOTE — Telephone Encounter (Signed)
Try and mask as much as possible (you and him)     keep him separate the best you can   Stay hydrated and rested  Call us if you develop any symptoms

## 2023-08-28 NOTE — Telephone Encounter (Signed)
Copied from CRM [id number]. Topic: Clinical - Medical Advice >> Aug 28, 2023  9:33 AM Theodis Sato wrote: Patients husband has a bad case of COVID and patient is concerned about what preventative steps she can take to avoid getting sick as well or if she does get sick what should she do.  Please call mobile home to advise.

## 2023-08-29 ENCOUNTER — Encounter: Payer: Self-pay | Admitting: Internal Medicine

## 2023-08-29 ENCOUNTER — Ambulatory Visit: Payer: PPO | Admitting: Internal Medicine

## 2023-08-29 ENCOUNTER — Ambulatory Visit: Payer: Self-pay | Admitting: Family Medicine

## 2023-08-29 VITALS — BP 124/60 | HR 80 | Temp 98.7°F | Ht 63.0 in | Wt 174.0 lb

## 2023-08-29 DIAGNOSIS — U071 COVID-19: Secondary | ICD-10-CM | POA: Diagnosis not present

## 2023-08-29 MED ORDER — NIRMATRELVIR/RITONAVIR (PAXLOVID)TABLET: 3.0000 | ORAL_TABLET | Freq: Two times a day (BID) | ORAL | 0 refills | Status: AC

## 2023-08-29 NOTE — Telephone Encounter (Signed)
Okay ?I will assess her at the visit ?

## 2023-08-29 NOTE — Assessment & Plan Note (Addendum)
So far fairly mild---but high risk due to age and didn't have vaccine Discussed analgesics To ER if SOB or markedly worse Will try the paxlovid--cut amlodipine in half while taking

## 2023-08-29 NOTE — Progress Notes (Signed)
Subjective:    Patient ID: Veronica Greene, female    DOB: 19-Feb-1946, 78 y.o.   MRN: [id number]  HPI Here due to COVID infection and not feeling well  Husband started feeling sick 3 days ago or so Micah Flesher out to get meds for him--and started coughing 2 days ago Fever in past 2 days---chilled last night No myalgias No sore throat or ear pain  No SOB  COVID test positive this morning Took some tylenol  Current Outpatient Medications on File Prior to Visit  Medication Sig Dispense Refill   amLODipine (NORVASC) 10 MG tablet TAKE 1 TABLET (10 MG TOTAL) BY MOUTH DAILY. 90 tablet 3   Ascorbic Acid (VITAMIN C) 1000 MG tablet Take 1,000 mg by mouth daily with breakfast.     aspirin EC 81 MG tablet Take 1 tablet (81 mg total) by mouth 2 (two) times daily. To prevent blood clots for 30 days after surgery. 60 tablet 0   Black Pepper-Turmeric (TURMERIC COMPLEX/BLACK PEPPER PO) Take 15 mLs by mouth daily with breakfast.     cetirizine (ZYRTEC) 10 MG tablet Take 10 mg by mouth daily.     cholecalciferol (VITAMIN D) 1000 UNITS tablet Take 1,000 Units by mouth daily with breakfast.      fluticasone (FLONASE) 50 MCG/ACT nasal spray Place 2 sprays into both nostrils at bedtime.      furosemide (LASIX) 20 MG tablet MAY TAKE 1 TABLET UP TO THREE TIMES A WEEK. DO NOT TAKE HCTZ THAT DAY. 30 tablet 11   hydrochlorothiazide (HYDRODIURIL) 25 MG tablet Take 1 tablet (25 mg total) by mouth See admin instructions. Take 25 mg by mouth Sunday, Monday, Wednesday and Friday 90 tablet 2   lubiprostone (AMITIZA) 24 MCG capsule TAKE 1 CAPSULE (24 MCG TOTAL) BY MOUTH 2 (TWO) TIMES DAILY. LUNCH & SUPPER 180 capsule 3   meclizine (ANTIVERT) 25 MG tablet TAKE 1/2 TO 1 TABLET BY MOUTH 3 TIMES DAILY AS NEEDED FOR DIZZINESS. CAUTION OF SEDATION 20 tablet 0   meloxicam (MOBIC) 7.5 MG tablet Take 1 tablet (7.5 mg total) by mouth at bedtime as needed for pain. With food 90 tablet 1   metoprolol succinate (TOPROL-XL) 50 MG 24  hr tablet Take 1 tablet (50 mg total) by mouth daily after supper. Take with or immediately following a meal. 90 tablet 3   Multiple Vitamin (MULTI-VITAMIN) tablet Take 1 tablet by mouth daily.     NEXIUM 40 MG capsule Take 40 mg by mouth in the morning.     Polyethyl Glycol-Propyl Glycol 0.4-0.3 % SOLN Place 1-2 drops into both eyes at bedtime.     potassium chloride (KLOR-CON) 10 MEQ tablet TAKE 2 TABLETS BY MOUTH 2 TIMES DAILY. 360 tablet 2   Simethicone 180 MG CAPS Take 1 capsule by mouth as needed.     sodium chloride (OCEAN) 0.65 % nasal spray Place 1 spray into the nose daily.     triamcinolone cream (KENALOG) 0.1 % Apply topically 2 (two) times daily. To affected area/ rash (Patient taking differently: Apply 1 Application topically as needed. To affected area/ rash) 30 g 0   venlafaxine XR (EFFEXOR-XR) 75 MG 24 hr capsule TAKE 1 CAPSULE (75 MG TOTAL) BY MOUTH DAILY AFTER BREAKFAST. 90 capsule 3   Current Facility-Administered Medications on File Prior to Visit  Medication Dose Route Frequency Provider Last Rate Last Admin   0.9 %  sodium chloride infusion  500 mL Intravenous Once Iva Boop, MD  Allergies  Allergen Reactions   Clarithromycin Swelling and Other (See Comments)    Throat Swells   Penicillins Hives and Other (See Comments)    WELTS Has patient had a PCN reaction causing immediate rash, facial/tongue/throat swelling, SOB or lightheadedness with hypotension: No Has patient had a PCN reaction causing severe rash involving mucus membranes or skin necrosis: Unknown Has patient had a PCN reaction that required hospitalization:No Has patient had a PCN reaction occurring within the last 10 years: No If all of the above answers are "NO", then may proceed with Cephalosporin use.    Other     Band-Aid, leaves mark on skin   Codeine Nausea And Vomiting    Past Medical History:  Diagnosis Date   Anxiety and depression 08/26/2007   Qualifier: Diagnosis of  By:  Milinda Antis MD, Colon Flattery    Arthritis    knees   Constipation, slow transit 11/03/2010   Degenerative disk disease 08/31/2011   Depression with anxiety    DERMATITIS, SEBORRHEIC 08/25/2010   Qualifier: Diagnosis of  By: Milinda Antis MD, Colon Flattery    Endometrial cancer [facility])    02/ 2019  Diagnosed with D&C/hysteroscopy with polypectomy   Essential hypertension 08/29/2017   Fibromyalgia    tx mobic   Full dentures    GERD 08/26/2007   Qualifier: Diagnosis of  By: Milinda Antis MD, Colon Flattery    Gout    Hearing loss    wears bilateral hearing aids   History of colon polyps    History of nonmelanoma skin cancer    right lower leg   History of poliomyelitis 08/26/2007   Qualifier: History of  By: Milinda Antis MD, Colon Flattery    History of radiation therapy    Endometium- 11/20/17-12/11/17- Vag Cuff -Dr. Antony Blackbird   Hyperlipidemia, mild 09/02/2012   Hypertension    Hypokalemia 01/02/2011   IBS (irritable bowel syndrome)    with constipation   Obesity 08/26/2007   Qualifier: Diagnosis of  By: Milinda Antis MD, Colon Flattery    OSTEOARTHRITIS, HANDS, BILATERAL 08/26/2007   Qualifier: Diagnosis of  By: Milinda Antis MD, Colon Flattery    Osteopenia    OVERACTIVE BLADDER 08/26/2007   Qualifier: Diagnosis of  By: Milinda Antis MD, Colon Flattery    Personal history of colonic polyps 11/03/2010   05/2018 13 polyps mostly adenomas, ssp's - recall 1 year 2020 Iva Boop, MD, [facility]    Poor balance 11/25/2018   With falls  Has rise in R shoe/post polio  Weak legs as well     S/P TAVR (transcatheter aortic valve replacement)    s/p TAVR with a 23mm Edwards S3U via the TF approach by Dr. Laneta Simmers and Dr. Clifton James   Severe calcific aortic stenosis 08/27/2017    -> Became severe as of October 2020-referred for TAVR on 04/28/2019   URINARY INCONTINENCE 08/26/2007   Qualifier: Diagnosis of  By: Milinda Antis MD, Colon Flattery    Wears hearing aid in both ears     Past Surgical History:  Procedure Laterality Date   ANKLE SURGERY Right 1956   CARPAL TUNNEL  RELEASE Bilateral 1986, 1993   CATARACT EXTRACTION Right 11/16/2019   COLONOSCOPY N/A 06/24/2015   Procedure: COLONOSCOPY;  Surgeon: Iva Boop, MD;  Location: [facility];  Service: Endoscopy;  Laterality: N/A;   DILATATION & CURETTAGE/HYSTEROSCOPY WITH MYOSURE N/A 08/09/2017   Procedure: DILATATION & CURETTAGE/HYSTEROSCOPY WITH MYOSURE;  Surgeon: Gerald Leitz, MD;  Location: [facility];  Service: Gynecology;  Laterality: N/A;  Polypectomy  DILATION AND CURETTAGE OF UTERUS     PMB   HAND SURGERY Bilateral 1999   Thumbs    KNEE SURGERY Bilateral 1998   x2   LYMPH NODE BIOPSY N/A 10/08/2017   Procedure: LYMPH NODE BIOPSY;  Surgeon: Adolphus Birchwood, MD;  Location: [facility];  Service: Gynecology;  Laterality: N/A;   MULTIPLE TOOTH EXTRACTIONS     NASAL SINUS SURGERY  1976   polio surgery.     right foot - right leg 1.5" shorter than left leg - some wekness   RIGHT/LEFT HEART CATH AND CORONARY ANGIOGRAPHY N/A 04/09/2019   Procedure: RIGHT/LEFT HEART CATH AND CORONARY ANGIOGRAPHY;  Surgeon: Marykay Lex, MD;  Location[facility] INVASIVE CV LAB;  Service: Cardiovascular;; Severe AS, mean gradient 50 mmHg P-P 55 mmHg (estimated AVA 0.76 cm).  Mildly elevated pulmonary pressures with elevated LVEDP and PCWP.  Angiographically normal coronary arteries, but tortuous.   ROBOTIC ASSISTED TOTAL HYSTERECTOMY WITH BILATERAL SALPINGO OOPHERECTOMY Bilateral 10/08/2017   Procedure: XI ROBOTIC ASSISTED TOTAL HYSTERECTOMY WITH BILATERAL SALPINGO OOPHORECTOMY;  Surgeon: Adolphus Birchwood, MD;  Location: [facility];  Service: Gynecology;  Laterality: Bilateral;   TEE WITHOUT CARDIOVERSION N/A 04/28/2019   Procedure: TRANSESOPHAGEAL ECHOCARDIOGRAM (TEE);  Surgeon: Kathleene Hazel, MD;  Location: [facility] INVASIVE CV LAB;  Service: Open Heart Surgery;  Laterality: N/A;   THUMB ARTHROSCOPY  1999   TONSILLECTOMY  2331   78 years old   TOTAL KNEE ARTHROPLASTY Right 05/22/2022   Procedure: TOTAL KNEE ARTHROPLASTY;  Surgeon: Sheral Apley, MD;  Location: [facility];  Service: Orthopedics;  Laterality: Right;   TRANSCATHETER AORTIC VALVE REPLACEMENT, TRANSFEMORAL N/A 04/28/2019   Procedure: TRANSCATHETER AORTIC VALVE REPLACEMENT, TRANSFEMORAL;  Surgeon: Kathleene Hazel, MD;  Location: [facility] INVASIVE CV LAB; ; 23 mm Sapien 3 Ultra THV via TF approach. (post-op mean AV gradient 13 mmHg)   TRANSTHORACIC ECHOCARDIOGRAM  05/15/2021   Normal/stable LV size and function.  EF 65 to 70%.  No RWMA.  GR 2 DD.  Moderate LA dilation.  Normal RV with mild RA dilation, normal RAP.Marland Kitchen  Severe MAC with no MR.  23 mm SAPIEN prosthetic TAVR valve well-seated.  Mean AVG 18 mm..  Trivial AI.  (No significant change from 05/04/2020-mean AVG 14 mmHg).   TRANSTHORACIC ECHOCARDIOGRAM  02/17/2019   Mod LVH. GR 2 DD. Severe AS (mean gradient = 44 mmHg with Mod AI. mild MS.   TRANSTHORACIC ECHOCARDIOGRAM  04/29/2019; 05/2020   a)  (post TAVR): EF 60-65%.  GRII DD.  Mod LA dilation & nl RA.  Mod MAC.  No  AI (no-PVL), mean AV gradient 13 mmHg.;; b) (2nd post TAVR) EF 60 to 65%.  Severely increased thickness/LVH.  GRII DD with only mildly elevated left atrial pressure.  Normal RA size.  Severe MAC with only trace MR.  No MS.  TAVR valve prosthesis functioning well.  Trivial AI.  Mean gradient 15 mmHg with a peak 29.8 mmHg   TUBAL LIGATION     UPPER GI ENDOSCOPY     normal   WISDOM TOOTH EXTRACTION      Family History  Problem Relation Age of Onset   Kidney disease Mother    Alzheimer's disease Mother    Stroke Father 98   Colon cancer Father 81       possibly colon cancer, possibly just polyps   Kidney disease Sister    Heart attack Sister    Colon cancer Paternal Aunt 19   Cancer Paternal Uncle  unk type   Esophageal cancer Neg Hx    Stomach cancer Neg Hx    Rectal cancer Neg Hx    Breast cancer Neg Hx    Ovarian cancer Neg Hx    Endometrial cancer Neg Hx    Pancreatic cancer Neg Hx    Prostate cancer Neg Hx    Colon polyps Neg Hx      Social History   Socioeconomic History   Marital status: Widowed    Spouse name: Not on file   Number of children: 2   Years of education: Not on file   Highest education level: High school graduate  Occupational History   Occupation: RECEPTION    Employer: [employer]    Comment: Retired; Theme park manager reception  Tobacco Use   Smoking status: Never    Passive exposure: Past   Smokeless tobacco: Never  Vaping Use   Vaping status: Never Used  Substance and Sexual Activity   Alcohol use: No    Alcohol/week: 0.0 standard drinks of alcohol   Drug use: No   Sexual activity: Yes    Birth control/protection: Post-menopausal  Other Topics Concern   Not on file  Social History Narrative   She is relatively recently remarried.  She has 2  children and 3 grandchildren.     She is currently retired.  But enjoys cleaning house and doing chores.  She likes to do yard work.  Does not routinely exercise.   Social Drivers of Corporate investment banker Strain: Low Risk  (12/18/2022)   Overall Financial Resource Strain (CARDIA)    Difficulty of Paying Living Expenses: Not hard at all  Food Insecurity: No Food Insecurity (12/18/2022)   Hunger Vital Sign    Worried About Running Out of Food in the Last Year: Never true    Ran Out of Food in the Last Year: Never true  Transportation Needs: No Transportation Needs (12/18/2022)   PRAPARE - Administrator, Civil Service (Medical): No    Lack of Transportation (Non-Medical): No  Physical Activity: Inactive (12/18/2022)   Exercise Vital Sign    Days of Exercise per Week: 0 days    Minutes of Exercise per Session: 0 min  Stress: No Stress Concern Present (12/18/2022)   [employer] of Occupational Health - Occupational Stress Questionnaire    Feeling of Stress : Not at all  Social Connections: Moderately Integrated (12/18/2022)   Social Connection and Isolation Panel [NHANES]    Frequency of Communication with Friends and  Family: More than three times a week    Frequency of Social Gatherings with Friends and Family: More than three times a week    Attends Religious Services: 1 to 4 times per year    Active Member of [employer] or Organizations: No    Attends Banker Meetings: Never    Marital Status: Married  Catering manager Violence: Not At Risk (12/18/2022)   Humiliation, Afraid, Rape, and Kick questionnaire    Fear of Current or Ex-Partner: No    Emotionally Abused: No    Physically Abused: No    Sexually Abused: No   Review of Systems No change in smell or taste No N/V Able to eat    Objective:   Physical Exam Constitutional:      Appearance: Normal appearance.  HENT:     Head:     Comments: No sinus tenderness    Right Ear: Tympanic membrane and ear canal normal.  Left Ear: Tympanic membrane and ear canal normal.     Mouth/Throat:     Pharynx: No oropharyngeal exudate or posterior oropharyngeal erythema.  Pulmonary:     Effort: Pulmonary effort is normal.     Breath sounds: Normal breath sounds. No wheezing or rales.  Musculoskeletal:     Cervical back: Neck supple.  Lymphadenopathy:     Cervical: No cervical adenopathy.  Neurological:     Mental Status: She is alert.            Assessment & Plan:

## 2023-08-29 NOTE — Telephone Encounter (Signed)
Copied from CRM [id number]. Topic: Clinical - Medical Advice >> Aug 29, 2023 11:19 AM Veronica Greene wrote: Reason for CRM: Patient called in stating she has tested positive for Covid, wanted to know if someone can send something in for her to the [facility].  Chief Complaint: COVID +  Symptoms: fever, cough-sometime productive, wheezing, body aches, loss of taste Frequency: started yesterday Pertinent Negatives: Patient denies SOB Disposition: [] ED /[] Urgent Care (no appt availability in office) / [x] Appointment(In office/virtual)/ []  [facility]/ [] Home Care/ [] Refused Recommended Disposition /[] [facility]/ []  Follow-up with PCP Additional Notes: pt's husband stated he tested positive the other day & this morning wife tested positive for COVID.  Wife would like to get Rx to help with s/s.   Reason for Disposition  [1] HIGH RISK patient (e.g., weak immune system, age > 64 years, obesity with BMI 30 or higher, pregnant, chronic lung disease or other chronic medical condition) AND [2] COVID symptoms (e.g., cough, fever)  (Exceptions: Already seen by PCP and no new or worsening symptoms.)  Answer Assessment - Initial Assessment Questions 1. COVID-19 DIAGNOSIS: "How do you know that you have COVID?" (e.g., positive lab test or self-test, diagnosed by doctor or NP/PA, symptoms after exposure).     Positive test 2. COVID-19 EXPOSURE: "Was there any known exposure to COVID before the symptoms began?" CDC Definition of close contact: within 6 feet (2 meters) for a total of 15 minutes or more over a 24-hour period.      Exposure by husband 3. ONSET: "When did the COVID-19 symptoms start?"      yesterday 4. WORST SYMPTOM: "What is your worst symptom?" (e.g., cough, fever, shortness of breath, muscle aches)     cough 5. COUGH: "Do you have a cough?" If Yes, ask: "How bad is the cough?"      moderate 6. FEVER: "Do you have a fever?" If Yes, ask: "What is your temperature,  how was it measured, and when did it start?"     yes 7. RESPIRATORY STATUS: "Describe your breathing?" (e.g., normal; shortness of breath, wheezing, unable to speak)     wheezing 8. BETTER-SAME-WORSE: "Are you getting better, staying the same or getting worse compared to yesterday?"  If getting worse, ask, "In what way?"     same 9. OTHER SYMPTOMS: "Do you have any other symptoms?"  (e.g., chills, fatigue, headache, loss of smell or taste, muscle pain, sore throat)     Fever, cough- some productive, body aches, loss of taste 10. HIGH RISK DISEASE: "Do you have any chronic medical problems?" (e.g., asthma, heart or lung disease, weak immune system, obesity, etc.)       Heart disease 11. VACCINE: "Have you had the COVID-19 vaccine?" If Yes, ask: "Which one, how many shots, when did you get it?"       yes 12. PREGNANCY: "Is there any chance you are pregnant?" "When was your last menstrual period?"       N/a 13. O2 SATURATION MONITOR:  "Do you use an oxygen saturation monitor (pulse oximeter) at home?" If Yes, ask "What is your reading (oxygen level) today?" "What is your usual oxygen saturation reading?" (e.g., 95%)       N/a  Protocols used: Coronavirus (COVID-19) Diagnosed or Suspected-A-AH

## 2023-08-29 NOTE — Telephone Encounter (Signed)
Appt was scheduled with Dr. Alphonsus Sias this afternoon, routing to him and PCP as a FYI

## 2023-09-26 ENCOUNTER — Other Ambulatory Visit: Payer: Self-pay | Admitting: Cardiology

## 2023-09-26 DIAGNOSIS — R609 Edema, unspecified: Secondary | ICD-10-CM

## 2023-09-26 DIAGNOSIS — Z952 Presence of prosthetic heart valve: Secondary | ICD-10-CM

## 2023-10-09 ENCOUNTER — Other Ambulatory Visit: Payer: Self-pay | Admitting: Family Medicine

## 2023-10-14 ENCOUNTER — Other Ambulatory Visit: Payer: Self-pay | Admitting: Family Medicine

## 2023-10-15 NOTE — Telephone Encounter (Signed)
 Last filled on 03/13/23 #90 tabs/ 1 refill  CPE scheduled on 12/17/23

## 2023-10-24 ENCOUNTER — Other Ambulatory Visit: Payer: Self-pay | Admitting: Family Medicine

## 2023-10-28 ENCOUNTER — Encounter: Payer: Self-pay | Admitting: Family Medicine

## 2023-10-28 ENCOUNTER — Ambulatory Visit (INDEPENDENT_AMBULATORY_CARE_PROVIDER_SITE_OTHER): Admitting: Family Medicine

## 2023-10-28 VITALS — BP 148/65 | HR 66 | Temp 97.9°F | Ht 63.0 in | Wt 171.5 lb

## 2023-10-28 DIAGNOSIS — M542 Cervicalgia: Secondary | ICD-10-CM | POA: Insufficient documentation

## 2023-10-28 DIAGNOSIS — I10 Essential (primary) hypertension: Secondary | ICD-10-CM

## 2023-10-28 NOTE — Patient Instructions (Addendum)
 See cardiology as planned  They may want to increase blood pressure medicine if blood pressure is not better    Try a little heat for 10 minutes at a time -this may relax muscles  Keep stretching neck when you can   I put the referral in for physical therapy  Please let us  know if you don't hear in 1-2 weeks

## 2023-10-28 NOTE — Assessment & Plan Note (Addendum)
 Blood pressure up today but pt notes she is stressed and also has pain (neck)  Will see her cardiologist early may for re check   BP: (!) 148/65    Metoprolol  xl 50 mg daily Hydrochlorothiazide  25 mg daily  Amlodipine  10 mg daily

## 2023-10-28 NOTE — Progress Notes (Signed)
 Subjective:    Patient ID: Veronica Greene, female    DOB: 1946/06/22, 78 y.o.   MRN: [id number]  HPI  Wt Readings from Last 3 Encounters:  10/28/23 171 lb 8 oz (77.8 kg)  08/29/23 174 lb (78.9 kg)  07/22/23 173 lb 9.6 oz (78.7 kg)   30.38 kg/m  Vitals:   10/28/23 1355 10/28/23 1413  BP: (!) 156/64 (!) 148/65  Pulse: 66   Temp: 97.9 F (36.6 C)   SpO2: 97%     Pt presents with neck problems   Happened after lifting some pine needles  Both sides of neck /worse on the right  Limited rom  Worst to rotate to the right   Lasts all day  In between sharp and dull    Got a little better and now not improving   Medium size pillow generally  Sleeps on her side    Over the counter Bio freeze-helped a bit  Tylenol   Has not tried heat   Meloxicam  is on medicine list   Seeing cardiology for palpitations  Has follow up soon  Unsure what medicines she is taking  Has been taking care of spouse recently who has hear problems and this keeps her stressed      Patient Active Problem List   Diagnosis Date Noted   Neck pain 10/28/2023   COVID-19 virus infection 08/29/2023   Chest pain of uncertain etiology 07/25/2023   Moderate mitral stenosis by prior echocardiogram 07/22/2023   Palpitations 04/30/2023   Tick bite of right upper arm 12/11/2022   Diabetes mellitus screening 11/25/2022   Auditory hallucination 08/28/2022   S/P total knee arthroplasty, right 05/22/2022   Laryngopharyngeal reflux 11/20/2021   Tinnitus 08/18/2020   Lower extremity edema 02/21/2020   S/P TAVR (transcatheter aortic valve replacement)    Poor balance 11/25/2018   History of endometrial cancer 09/12/2017   Essential hypertension 08/29/2017   Severe calcific aortic stenosis 08/27/2017   Estrogen deficiency 11/05/2016   Encounter for general adult medical examination with abnormal findings 10/15/2015   Benign paroxysmal positional vertigo 06/18/2014   Hyperlipidemia with target LDL  less than 100 09/02/2012   Degenerative disk disease 08/31/2011   History of colonic polyps 11/03/2010   Gout 08/25/2010   DERMATITIS, SEBORRHEIC 08/25/2010   Osteopenia 09/05/2007   History of poliomyelitis 08/26/2007   Anxiety and depression 08/26/2007   ALLERGIC RHINITIS 08/26/2007   GERD 08/26/2007   IBS 08/26/2007   OVERACTIVE BLADDER 08/26/2007   OSTEOARTHRITIS, HANDS, BILATERAL 08/26/2007   URINARY INCONTINENCE 08/26/2007   Past Medical History:  Diagnosis Date   Anxiety and depression 08/26/2007   Qualifier: Diagnosis of  By: Malissa Se MD, Dannette Dutch    Arthritis    knees   Constipation, slow transit 11/03/2010   Degenerative disk disease 08/31/2011   Depression with anxiety    DERMATITIS, SEBORRHEIC 08/25/2010   Qualifier: Diagnosis of  By: Malissa Se MD, Dannette Dutch    Endometrial cancer [facility])    02/ 2019  Diagnosed with D&C/hysteroscopy with polypectomy   Essential hypertension 08/29/2017   Fibromyalgia    tx mobic    Full dentures    GERD 08/26/2007   Qualifier: Diagnosis of  By: Malissa Se MD, Dannette Dutch    Gout    Hearing loss    wears bilateral hearing aids   History of colon polyps    History of nonmelanoma skin cancer    right lower leg   History of poliomyelitis 08/26/2007   Qualifier: History  of  By: Malissa Se MD, Dannette Dutch    History of radiation therapy    Endometium- 11/20/17-12/11/17- Vag Cuff -Dr. Retta Caster   Hyperlipidemia, mild 09/02/2012   Hypertension    Hypokalemia 01/02/2011   IBS (irritable bowel syndrome)    with constipation   Obesity 08/26/2007   Qualifier: Diagnosis of  By: Malissa Se MD, Dannette Dutch    OSTEOARTHRITIS, HANDS, BILATERAL 08/26/2007   Qualifier: Diagnosis of  By: Malissa Se MD, Dannette Dutch    Osteopenia    OVERACTIVE BLADDER 08/26/2007   Qualifier: Diagnosis of  By: Malissa Se MD, Dannette Dutch    Personal history of colonic polyps 11/03/2010   05/2018 13 polyps mostly adenomas, ssp's - recall 1 year 2020 Kenney Peacemaker, MD, [facility]    Poor balance  11/25/2018   With falls  Has rise in R shoe/post polio  Weak legs as well     S/P TAVR (transcatheter aortic valve replacement)    s/p TAVR with a 23mm Edwards S3U via the TF approach by Dr. Sherene Dilling and Dr. Abel Hoe   Severe calcific aortic stenosis 08/27/2017    -> Became severe as of October 2020-referred for TAVR on 04/28/2019   URINARY INCONTINENCE 08/26/2007   Qualifier: Diagnosis of  By: Malissa Se MD, Dannette Dutch    Wears hearing aid in both ears    Past Surgical History:  Procedure Laterality Date   ANKLE SURGERY Right 1956   CARPAL TUNNEL RELEASE Bilateral 1986, 1993   CATARACT EXTRACTION Right 11/16/2019   COLONOSCOPY N/A 06/24/2015   Procedure: COLONOSCOPY;  Surgeon: Kenney Peacemaker, MD;  Location: [facility];  Service: Endoscopy;  Laterality: N/A;   DILATATION & CURETTAGE/HYSTEROSCOPY WITH MYOSURE N/A 08/09/2017   Procedure: DILATATION & CURETTAGE/HYSTEROSCOPY WITH MYOSURE;  Surgeon: Arlee Lace, MD;  Location: [facility];  Service: Gynecology;  Laterality: N/A;  Polypectomy   DILATION AND CURETTAGE OF UTERUS     PMB   HAND SURGERY Bilateral 1999   Thumbs    KNEE SURGERY Bilateral 1998   x2   LYMPH NODE BIOPSY N/A 10/08/2017   Procedure: LYMPH NODE BIOPSY;  Surgeon: Alphonso Aschoff, MD;  Location: [facility];  Service: Gynecology;  Laterality: N/A;   MULTIPLE TOOTH EXTRACTIONS     NASAL SINUS SURGERY  1976   polio surgery.     right foot - right leg 1.5" shorter than left leg - some wekness   RIGHT/LEFT HEART CATH AND CORONARY ANGIOGRAPHY N/A 04/09/2019   Procedure: RIGHT/LEFT HEART CATH AND CORONARY ANGIOGRAPHY;  Surgeon: Arleen Lacer, MD;  Location: [facility] INVASIVE CV LAB;  Service: Cardiovascular;; Severe AS, mean gradient 50 mmHg P-P 55 mmHg (estimated AVA 0.76 cm).  Mildly elevated pulmonary pressures with elevated LVEDP and PCWP.  Angiographically normal coronary arteries, but tortuous.   ROBOTIC ASSISTED TOTAL HYSTERECTOMY WITH BILATERAL SALPINGO OOPHERECTOMY Bilateral 10/08/2017    Procedure: XI ROBOTIC ASSISTED TOTAL HYSTERECTOMY WITH BILATERAL SALPINGO OOPHORECTOMY;  Surgeon: Alphonso Aschoff, MD;  Location: [facility];  Service: Gynecology;  Laterality: Bilateral;   TEE WITHOUT CARDIOVERSION N/A 04/28/2019   Procedure: TRANSESOPHAGEAL ECHOCARDIOGRAM (TEE);  Surgeon: Odie Benne, MD;  Location: [facility] INVASIVE CV LAB;  Service: Open Heart Surgery;  Laterality: N/A;   THUMB ARTHROSCOPY  1999   TONSILLECTOMY  8746   78 years old   TOTAL KNEE ARTHROPLASTY Right 05/22/2022   Procedure: TOTAL KNEE ARTHROPLASTY;  Surgeon: Saundra Curl, MD;  Location: [facility];  Service: Orthopedics;  Laterality: Right;   TRANSCATHETER AORTIC VALVE REPLACEMENT, TRANSFEMORAL  N/A 04/28/2019   Procedure: TRANSCATHETER AORTIC VALVE REPLACEMENT, TRANSFEMORAL;  Surgeon: Odie Benne, MD;  Location: [facility] INVASIVE CV LAB; ; 23 mm Sapien 3 Ultra THV via TF approach. (post-op mean AV gradient 13 mmHg)   TRANSTHORACIC ECHOCARDIOGRAM  05/15/2021   Normal/stable LV size and function.  EF 65 to 70%.  No RWMA.  GR 2 DD.  Moderate LA dilation.  Normal RV with mild RA dilation, normal RAP.Aaron Aas  Severe MAC with no MR.  23 mm SAPIEN prosthetic TAVR valve well-seated.  Mean AVG 18 mm..  Trivial AI.  (No significant change from 05/04/2020-mean AVG 14 mmHg).   TRANSTHORACIC ECHOCARDIOGRAM  02/17/2019   Mod LVH. GR 2 DD. Severe AS (mean gradient = 44 mmHg with Mod AI. mild MS.   TRANSTHORACIC ECHOCARDIOGRAM  04/29/2019; 05/2020   a)  (post TAVR): EF 60-65%.  GRII DD.  Mod LA dilation & nl RA.  Mod MAC.  No  AI (no-PVL), mean AV gradient 13 mmHg.;; b) (2nd post TAVR) EF 60 to 65%.  Severely increased thickness/LVH.  GRII DD with only mildly elevated left atrial pressure.  Normal RA size.  Severe MAC with only trace MR.  No MS.  TAVR valve prosthesis functioning well.  Trivial AI.  Mean gradient 15 mmHg with a peak 29.8 mmHg   TUBAL LIGATION     UPPER GI ENDOSCOPY     normal   WISDOM TOOTH EXTRACTION     Social  History   Tobacco Use   Smoking status: Never    Passive exposure: Past   Smokeless tobacco: Never  Vaping Use   Vaping status: Never Used  Substance Use Topics   Alcohol  use: No    Alcohol /week: 0.0 standard drinks of alcohol    Drug use: No   Family History  Problem Relation Age of Onset   Kidney disease Mother    Alzheimer's disease Mother    Stroke Father 69   Colon cancer Father 66       possibly colon cancer, possibly just polyps   Kidney disease Sister    Heart attack Sister    Colon cancer Paternal Aunt 66   Cancer Paternal Uncle        unk type   Esophageal cancer Neg Hx    Stomach cancer Neg Hx    Rectal cancer Neg Hx    Breast cancer Neg Hx    Ovarian cancer Neg Hx    Endometrial cancer Neg Hx    Pancreatic cancer Neg Hx    Prostate cancer Neg Hx    Colon polyps Neg Hx    Allergies  Allergen Reactions   Clarithromycin Swelling and Other (See Comments)    Throat Swells   Penicillins Hives and Other (See Comments)    WELTS Has patient had a PCN reaction causing immediate rash, facial/tongue/throat swelling, SOB or lightheadedness with hypotension: No Has patient had a PCN reaction causing severe rash involving mucus membranes or skin necrosis: Unknown Has patient had a PCN reaction that required hospitalization:No Has patient had a PCN reaction occurring within the last 10 years: No If all of the above answers are "NO", then may proceed with Cephalosporin use.    Other     Band-Aid, leaves mark on skin   Codeine Nausea And Vomiting   Current Outpatient Medications on File Prior to Visit  Medication Sig Dispense Refill   amLODipine  (NORVASC ) 10 MG tablet TAKE 1 TABLET (10 MG TOTAL) BY MOUTH DAILY. 90 tablet 3  Ascorbic Acid (VITAMIN C) 1000 MG tablet Take 1,000 mg by mouth daily with breakfast.     aspirin  EC 81 MG tablet Take 1 tablet (81 mg total) by mouth 2 (two) times daily. To prevent blood clots for 30 days after surgery. 60 tablet 0   Black  Pepper-Turmeric (TURMERIC COMPLEX/BLACK PEPPER PO) Take 15 mLs by mouth daily with breakfast.     cetirizine (ZYRTEC) 10 MG tablet Take 10 mg by mouth daily.     cholecalciferol (VITAMIN D ) 1000 UNITS tablet Take 1,000 Units by mouth daily with breakfast.      fluticasone  (FLONASE ) 50 MCG/ACT nasal spray Place 2 sprays into both nostrils at bedtime.      furosemide  (LASIX ) 20 MG tablet MAY TAKE 1 TABLET UP TO THREE TIMES A WEEK. DO NOT TAKE HCTZ THAT DAY. 36 tablet 3   hydrochlorothiazide  (HYDRODIURIL ) 25 MG tablet Take 1 tablet (25 mg total) by mouth See admin instructions. Take 25 mg by mouth Sunday, Monday, Wednesday and Friday 90 tablet 2   lubiprostone  (AMITIZA ) 24 MCG capsule TAKE 1 CAPSULE (24 MCG TOTAL) BY MOUTH 2 (TWO) TIMES DAILY. LUNCH & SUPPER 180 capsule 3   meclizine  (ANTIVERT ) 25 MG tablet TAKE 1/2 TO 1 TABLET BY MOUTH 3 TIMES DAILY AS NEEDED FOR DIZZINESS. CAUTION OF SEDATION 20 tablet 0   meloxicam  (MOBIC ) 7.5 MG tablet TAKE 1 TABLET (7.5 MG TOTAL) BY MOUTH AT BEDTIME AS NEEDED FOR PAIN. TAKE WITH FOOD. 90 tablet 1   Multiple Vitamin (MULTI-VITAMIN) tablet Take 1 tablet by mouth daily.     NEXIUM  40 MG capsule Take 40 mg by mouth in the morning.     Polyethyl Glycol-Propyl Glycol 0.4-0.3 % SOLN Place 1-2 drops into both eyes at bedtime.     potassium chloride  (KLOR-CON ) 10 MEQ tablet TAKE 2 TABLETS BY MOUTH 2 TIMES DAILY. 360 tablet 0   Simethicone 180 MG CAPS Take 1 capsule by mouth as needed.     sodium chloride  (OCEAN) 0.65 % nasal spray Place 1 spray into the nose daily.     triamcinolone  cream (KENALOG ) 0.1 % Apply topically 2 (two) times daily. To affected area/ rash (Patient taking differently: Apply 1 Application topically as needed. To affected area/ rash) 30 g 0   venlafaxine  XR (EFFEXOR -XR) 75 MG 24 hr capsule TAKE 1 CAPSULE (75 MG TOTAL) BY MOUTH DAILY AFTER BREAKFAST. 90 capsule 3   metoprolol  succinate (TOPROL -XL) 50 MG 24 hr tablet Take 1 tablet (50 mg total) by mouth  daily after supper. Take with or immediately following a meal. 90 tablet 3   No current facility-administered medications on file prior to visit.    Review of Systems  Constitutional:  Negative for activity change, appetite change, fatigue, fever and unexpected weight change.  HENT:  Negative for congestion, ear pain, rhinorrhea, sinus pressure and sore throat.   Eyes:  Negative for pain, redness and visual disturbance.  Respiratory:  Negative for cough, shortness of breath and wheezing.   Cardiovascular:  Positive for palpitations. Negative for chest pain.  Gastrointestinal:  Negative for abdominal pain, blood in stool, constipation and diarrhea.  Endocrine: Negative for polydipsia and polyuria.  Genitourinary:  Negative for dysuria, frequency and urgency.  Musculoskeletal:  Positive for neck pain and neck stiffness. Negative for arthralgias, back pain and myalgias.  Skin:  Negative for pallor and rash.  Allergic/Immunologic: Negative for environmental allergies.  Neurological:  Negative for dizziness, syncope and headaches.  Hematological:  Negative for adenopathy. Does  not bruise/bleed easily.  Psychiatric/Behavioral:  Negative for decreased concentration and dysphoric mood. The patient is not nervous/anxious.        Recent increase in stress        Objective:   Physical Exam Constitutional:      General: She is not in acute distress.    Appearance: Normal appearance. She is well-developed. She is obese. She is not ill-appearing or diaphoretic.  HENT:     Head: Normocephalic and atraumatic.     Ears:     Comments: Hard of hearing  Eyes:     Conjunctiva/sclera: Conjunctivae normal.     Pupils: Pupils are equal, round, and reactive to light.  Neck:     Thyroid : No thyromegaly.     Vascular: No carotid bruit or JVD.     Comments: Bilateral cervical muscle tenderness Bilateral trapezius tenderness  Neck pain with all rom positions Worse with rotation to right and tilt   No  crepitus No bony tenderness  Cardiovascular:     Rate and Rhythm: Normal rate and regular rhythm.     Heart sounds: Normal heart sounds.     No gallop.  Pulmonary:     Effort: Pulmonary effort is normal. No respiratory distress.     Breath sounds: Normal breath sounds. No wheezing or rales.  Abdominal:     General: There is no distension or abdominal bruit.     Palpations: Abdomen is soft.  Musculoskeletal:     Cervical back: Normal range of motion and neck supple. Tenderness present.     Right lower leg: No edema.     Left lower leg: No edema.     Comments: Baseline leg length discrepancy   Lymphadenopathy:     Cervical: No cervical adenopathy.  Skin:    General: Skin is warm and dry.     Coloration: Skin is not pale.     Findings: No rash.  Neurological:     Mental Status: She is alert.     Coordination: Coordination normal.     Deep Tendon Reflexes: Reflexes are normal and symmetric. Reflexes normal.  Psychiatric:        Mood and Affect: Mood normal.           Assessment & Plan:   Problem List Items Addressed This Visit       Cardiovascular and Mediastinum   Essential hypertension (Chronic)   Blood pressure up today but pt notes she is stressed and also has pain (neck)  Will see her cardiologist early may for re check   BP: (!) 148/65    Metoprolol  xl 50 mg daily Hydrochlorothiazide  25 mg daily  Amlodipine  10 mg daily        Other   Neck pain - Primary   For 3 weeks after lifting/outdoor work  All positions/ some stiffness  Bio freeze and tylenol  help a bit ,  unsure if she is taking meloxicam    Reassuring exam/ tight cervical muscles/ most discomfort with rotation and tilt right   Encouraged to try gentle heat 10 min at a time Referral done to PT , eval and treat   Call back and Er precautions noted in detail today        Relevant Orders   Ambulatory referral to Physical Therapy

## 2023-10-28 NOTE — Assessment & Plan Note (Signed)
 For 3 weeks after lifting/outdoor work  All positions/ some stiffness  Bio freeze and tylenol  help a bit ,  unsure if she is taking meloxicam    Reassuring exam/ tight cervical muscles/ most discomfort with rotation and tilt right   Encouraged to try gentle heat 10 min at a time Referral done to PT , eval and treat   Call back and Er precautions noted in detail today

## 2023-11-05 DIAGNOSIS — R208 Other disturbances of skin sensation: Secondary | ICD-10-CM | POA: Diagnosis not present

## 2023-11-05 DIAGNOSIS — L538 Other specified erythematous conditions: Secondary | ICD-10-CM | POA: Diagnosis not present

## 2023-11-05 DIAGNOSIS — D225 Melanocytic nevi of trunk: Secondary | ICD-10-CM | POA: Diagnosis not present

## 2023-11-05 DIAGNOSIS — L821 Other seborrheic keratosis: Secondary | ICD-10-CM | POA: Diagnosis not present

## 2023-11-05 DIAGNOSIS — L570 Actinic keratosis: Secondary | ICD-10-CM | POA: Diagnosis not present

## 2023-11-05 DIAGNOSIS — L820 Inflamed seborrheic keratosis: Secondary | ICD-10-CM | POA: Diagnosis not present

## 2023-11-05 DIAGNOSIS — Z08 Encounter for follow-up examination after completed treatment for malignant neoplasm: Secondary | ICD-10-CM | POA: Diagnosis not present

## 2023-11-05 DIAGNOSIS — Z85828 Personal history of other malignant neoplasm of skin: Secondary | ICD-10-CM | POA: Diagnosis not present

## 2023-11-05 DIAGNOSIS — L814 Other melanin hyperpigmentation: Secondary | ICD-10-CM | POA: Diagnosis not present

## 2023-11-05 DIAGNOSIS — I872 Venous insufficiency (chronic) (peripheral): Secondary | ICD-10-CM | POA: Diagnosis not present

## 2023-11-11 ENCOUNTER — Encounter: Payer: Self-pay | Admitting: Cardiology

## 2023-11-11 ENCOUNTER — Ambulatory Visit: Payer: PPO | Attending: Cardiology | Admitting: Cardiology

## 2023-11-11 VITALS — BP 126/46 | HR 71 | Ht 65.0 in | Wt 171.0 lb

## 2023-11-11 DIAGNOSIS — R2689 Other abnormalities of gait and mobility: Secondary | ICD-10-CM

## 2023-11-11 DIAGNOSIS — Z952 Presence of prosthetic heart valve: Secondary | ICD-10-CM | POA: Diagnosis not present

## 2023-11-11 DIAGNOSIS — R600 Localized edema: Secondary | ICD-10-CM

## 2023-11-11 DIAGNOSIS — R002 Palpitations: Secondary | ICD-10-CM

## 2023-11-11 DIAGNOSIS — I050 Rheumatic mitral stenosis: Secondary | ICD-10-CM

## 2023-11-11 DIAGNOSIS — E785 Hyperlipidemia, unspecified: Secondary | ICD-10-CM | POA: Diagnosis not present

## 2023-11-11 NOTE — Progress Notes (Unsigned)
 Cardiology Office Note:  .   Date:  11/12/2023  ID:  Corky Diener, DOB 1946-03-13, MRN [id number] PCP: Clemens Curt, MD  [facility] Providers Cardiologist:  Randene Bustard, MD     Chief Complaint  Patient presents with   Follow-up    Follow-up to reassess symptoms   Palpitations    Palpitations much better with higher dose beta-blocker   Cardiac Valve Problem    Status post TAVR with additional mitral stenosis.  No major symptoms.    Patient Profile: .     Veronica Greene is an obese 78 y.o. female  with a PMH notable for h/o Severe Calcific Aortic Stenosis s/p TAVR (on Tober 2020), HTN/HLD, with moderate mitral stenosis who presents here for formal follow-up Referring provider: Tower, Manley Seeds, MD.  Other PMH notable for GERD, fibromyalgia, history of endometrial cancer, postpolio syndrome with depression/anxiety.  s/p Transcatheter Aortic Valve Replacement (TAVR) with a 23mm Edwards S3U via the TF approach by Dr. Sherene Dilling and Dr. Abel Hoe 04/2019 => for severe calcific aortic stenosis.  Residual moderate MS-not felt to be a good operative candidate and has significant calcification making BAV for option.    Veronica Greene was last seen on July 22 2023 noting intermittent fluttering sensations lasting up to 15 minutes with occasional chest discomfort.  Increased Toprol  to 50 mg daily with plans for close follow-up to reassess.  Discussed purchasing Kardia-Mobile to monitor for A-fib.  She had 1 or 2 episodes of sharp localized chest pain thought to be costochondritis.  Subjective  Discussed the use of AI scribe software for clinical note transcription with the patient, who gave verbal consent to proceed.  History of Present Illness History of Present Illness Veronica Greene is a 78 year old female with atrial fibrillation and mitral valve issues who presents for follow-up regarding palpitations and dizziness as a follow-up after adjusting  metoprolol  dose..  She experiences episodes of heart racing that previously lasted 10-15 minutes. Despite being on a heart monitor, no episodes were captured. Her metoprolol  dose was increased to 50 mg, which has improved the frequency and intensity of the palpitations.  She states that since starting the higher dose of beta-blocker, the palpitations have improved but they still occur just not as forceful.  She does have some dizziness which occurs primarily when standing up or getting up but not necessarily with her fast heart rates. She takes meclizine  for dizziness, which sometimes causes drowsiness. She is also on amlodipine  10 mg and aspirin . Dizziness occurs without passing out.  No swelling, shortness of breath when lying flat, or waking up at night due to breathing difficulties. She sleeps on one pillow and notes that swelling decreases when she elevates her feet. No chest pain or pressure with rest or exertion.  No syncope or near syncope.  No TIA or amaurosis fugax.  No claudication.  She has a history of aortic valve replacement and mitral valve issues, likely due to rheumatic heart disease. A heart ablation for atrial fibrillation was performed a month ago and was reportedly successful.  She has canceled two trips this year due to her husband's health issues.     Objective   Cardiac Meds: Aspirin  81 Miller daily, amlodipine  10 mg daily, HCTZ 25 mg Sunday, Monday, Wednesday and Friday, furosemide  on other days.  Toprol -XL 50 mg daily; as needed meclizine  12.5 mg once TID.  Studies Reviewed: Aaron Aas       No new studies  LABS (from PCP via [facility]) May 2024: TC 175, TG 125, HDL 50, LDL 99.  A1c 5.0.  Hgb 12.3. Cr 0.6, K+ 3.8, TSH 2.39.  Risk Assessment/Calculations:             Physical Exam:   VS:  BP (!) 126/46   Pulse 71   Ht 5\' 5"  (1.651 m)   Wt 171 lb (77.6 kg)   SpO2 95%   BMI 28.46 kg/m    Wt Readings from Last 3 Encounters:  11/11/23 171 lb (77.6 kg)  10/28/23 171 lb 8  oz (77.8 kg)  08/29/23 174 lb (78.9 kg)    GEN: Well nourished, well developed in no acute distress; healthy-appearing NECK: No JVD; No carotid bruits CARDIAC: RRR with occasional ectopy, Normal S1, S2; harsh 2-3/6 SEM at RUSB and soft diastolic rumble (1/6) at LLSB.  No rubs, gallops RESPIRATORY:  Clear to auscultation without rales, wheezing or rhonchi ; nonlabored, good air movement. ABDOMEN: Soft, non-tender, non-distended EXTREMITIES:  No edema; No deformity      ASSESSMENT AND PLAN: .    Problem List Items Addressed This Visit       Cardiology Problems   Hyperlipidemia with target LDL less than 100 (Chronic)   Lipids are pretty much close to goal.  Trying to avoid statins      Moderate mitral stenosis by prior echocardiogram - Primary (Chronic)   Mitral valve stenosis likely due to rheumatic heart disease with calcification. No significant progression. Discussed mitral valve replacement complexity due to calcification and anatomical challenges. Current strategy is to monitor and avoid surgery unless necessary. - Order echocardiogram in November or December.      Relevant Orders   ECHOCARDIOGRAM COMPLETE     Other   Lower extremity edema (Chronic)   Pretty well-controlled with intermittent HCTZ and Lasix  dosing.  Continue as directed      Relevant Orders   ECHOCARDIOGRAM COMPLETE   Palpitations (Chronic)   Monitor did not show any evidence of atrial fibrillation. Palpitations were improved with increased dose of beta-blocker. - Consider home monitoring with cardio mobile if palpitations recur. - Continue metoprolol  succinate 50 mg.      Relevant Orders   ECHOCARDIOGRAM COMPLETE   Poor balance (Chronic)   Poor balance versus dizziness or vertigo. There is some orthostatic component and I recommended if she were to have issues she should reduce her amlodipine  to 5 mg daily.      S/P TAVR (transcatheter aortic valve replacement) (Chronic)   Will follow-up echo  and November-December -> Discussed SBE prophylaxis.      Relevant Orders   ECHOCARDIOGRAM COMPLETE           Follow-Up: Return in about 8 months (around 07/13/2024) for Routine follow up with me, [employer].    Signed, Arleen Lacer, MD, MS Randene Bustard, M.D., M.S. Interventional Chartered certified accountant  Pager # [phone]

## 2023-11-11 NOTE — Patient Instructions (Addendum)
 Medication Instructions:  If you become  frequently more dizzy  - Decreas Amlodipine  to 5 mg ( 1/2 tablet)  *If you need a refill on your cardiac medications before your next appointment, please call your pharmacy*   Lab Work: Not needed    Testing/Procedures: In Nov or Dec 2025  Your physician has requested that you have an echocardiogram. Echocardiography is a painless test that uses sound waves to create images of your heart. It provides your doctor with information about the size and shape of your heart and how well your heart's chambers and valves are working. This procedure takes approximately one hour. There are no restrictions for this procedure. Please do NOT wear cologne, perfume, aftershave, or lotions (deodorant is allowed). Please arrive 15 minutes prior to your appointment time.  Please note: We ask at that you not bring children with you during ultrasound (echo/ vascular) testing. Due to room size and safety concerns, children are not allowed in the ultrasound rooms during exams. Our front office staff cannot provide observation of children in our lobby area while testing is being conducted. An adult accompanying a patient to their appointment will only be allowed in the ultrasound room at the discretion of the ultrasound technician under special circumstances. We apologize for any inconvenience.   Follow-Up: At [facility], you and your health needs are our priority.  As part of our continuing mission to provide you with exceptional heart care, we have created designated Provider Care Teams.  These Care Teams include your primary Cardiologist (physician) and Advanced Practice Providers (APPs -  Physician Assistants and Nurse Practitioners) who all work together to provide you with the care you need, when you need it.     Your next appointment:    8 to 9 month(s)  The format for your next appointment:   In Person  Provider:   Randene Bustard, MD   Other  Instructions

## 2023-11-12 ENCOUNTER — Encounter: Payer: Self-pay | Admitting: Cardiology

## 2023-11-12 NOTE — Assessment & Plan Note (Signed)
 Monitor did not show any evidence of atrial fibrillation. Palpitations were improved with increased dose of beta-blocker. - Consider home monitoring with cardio mobile if palpitations recur. - Continue metoprolol  succinate 50 mg.

## 2023-11-12 NOTE — Assessment & Plan Note (Signed)
 Lipids are pretty much close to goal.  Trying to avoid statins

## 2023-11-12 NOTE — Assessment & Plan Note (Signed)
 Poor balance versus dizziness or vertigo. There is some orthostatic component and I recommended if she were to have issues she should reduce her amlodipine  to 5 mg daily.

## 2023-11-12 NOTE — Assessment & Plan Note (Signed)
 Mitral valve stenosis likely due to rheumatic heart disease with calcification. No significant progression. Discussed mitral valve replacement complexity due to calcification and anatomical challenges. Current strategy is to monitor and avoid surgery unless necessary. - Order echocardiogram in November or December.

## 2023-11-12 NOTE — Assessment & Plan Note (Signed)
 Will follow-up echo and November-December -> Discussed SBE prophylaxis.

## 2023-11-12 NOTE — Assessment & Plan Note (Signed)
 Pretty well-controlled with intermittent HCTZ and Lasix  dosing.  Continue as directed

## 2023-11-25 ENCOUNTER — Telehealth: Payer: Self-pay | Admitting: Family Medicine

## 2023-11-25 DIAGNOSIS — Z79899 Other long term (current) drug therapy: Secondary | ICD-10-CM | POA: Insufficient documentation

## 2023-11-25 DIAGNOSIS — E785 Hyperlipidemia, unspecified: Secondary | ICD-10-CM

## 2023-11-25 DIAGNOSIS — I10 Essential (primary) hypertension: Secondary | ICD-10-CM

## 2023-11-25 NOTE — Telephone Encounter (Signed)
-----   Message from Gerry Krone sent at 11/25/2023  9:25 AM EDT ----- Regarding: Lab orders for Tues, 6.3.25 Patient is scheduled for CPX labs, please order future labs, Thanks , Anselmo Kings

## 2023-12-10 ENCOUNTER — Ambulatory Visit: Payer: Self-pay | Admitting: Family Medicine

## 2023-12-10 ENCOUNTER — Other Ambulatory Visit (INDEPENDENT_AMBULATORY_CARE_PROVIDER_SITE_OTHER): Payer: PPO

## 2023-12-10 DIAGNOSIS — I10 Essential (primary) hypertension: Secondary | ICD-10-CM | POA: Diagnosis not present

## 2023-12-10 DIAGNOSIS — Z79899 Other long term (current) drug therapy: Secondary | ICD-10-CM

## 2023-12-10 DIAGNOSIS — E785 Hyperlipidemia, unspecified: Secondary | ICD-10-CM | POA: Diagnosis not present

## 2023-12-10 LAB — CBC WITH DIFFERENTIAL/PLATELET
Basophils Absolute: 0 10*3/uL (ref 0.0–0.1)
Basophils Relative: 0.5 % (ref 0.0–3.0)
Eosinophils Absolute: 0.1 10*3/uL (ref 0.0–0.7)
Eosinophils Relative: 2.7 % (ref 0.0–5.0)
HCT: 36.6 % (ref 36.0–46.0)
Hemoglobin: 12.6 g/dL (ref 12.0–15.0)
Lymphocytes Relative: 21 % (ref 12.0–46.0)
Lymphs Abs: 1 10*3/uL (ref 0.7–4.0)
MCHC: 34.5 g/dL (ref 30.0–36.0)
MCV: 86.6 fl (ref 78.0–100.0)
Monocytes Absolute: 0.3 10*3/uL (ref 0.1–1.0)
Monocytes Relative: 5.6 % (ref 3.0–12.0)
Neutro Abs: 3.3 10*3/uL (ref 1.4–7.7)
Neutrophils Relative %: 70.2 % (ref 43.0–77.0)
Platelets: 249 10*3/uL (ref 150.0–400.0)
RBC: 4.23 Mil/uL (ref 3.87–5.11)
RDW: 14.1 % (ref 11.5–15.5)
WBC: 4.7 10*3/uL (ref 4.0–10.5)

## 2023-12-10 LAB — COMPREHENSIVE METABOLIC PANEL WITH GFR
ALT: 11 U/L (ref 0–35)
AST: 16 U/L (ref 0–37)
Albumin: 4.7 g/dL (ref 3.5–5.2)
Alkaline Phosphatase: 54 U/L (ref 39–117)
BUN: 11 mg/dL (ref 6–23)
CO2: 31 meq/L (ref 19–32)
Calcium: 9.8 mg/dL (ref 8.4–10.5)
Chloride: 102 meq/L (ref 96–112)
Creatinine, Ser: 0.61 mg/dL (ref 0.40–1.20)
GFR: 85.41 mL/min (ref >60.00)
Glucose, Bld: 95 mg/dL (ref 70–99)
Potassium: 4 meq/L (ref 3.5–5.1)
Sodium: 140 meq/L (ref 135–145)
Total Bilirubin: 0.8 mg/dL (ref 0.2–1.2)
Total Protein: 7 g/dL (ref 6.0–8.3)

## 2023-12-10 LAB — TSH: TSH: 2.29 u[IU]/mL (ref 0.35–5.50)

## 2023-12-10 LAB — LIPID PANEL
Cholesterol: 205 mg/dL — ABNORMAL HIGH (ref 0–200)
HDL: 49.7 mg/dL (ref >39.00)
LDL Cholesterol: 122 mg/dL — ABNORMAL HIGH (ref 0–99)
NonHDL: 154.83
Total CHOL/HDL Ratio: 4
Triglycerides: 164 mg/dL — ABNORMAL HIGH (ref 0.0–149.0)
VLDL: 32.8 mg/dL (ref 0.0–40.0)

## 2023-12-10 LAB — VITAMIN B12: Vitamin B-12: 331 pg/mL (ref 211–911)

## 2023-12-10 NOTE — Addendum Note (Signed)
 Addended by: Queenie Brunet on: 12/10/2023 09:01 AM   Modules accepted: Orders

## 2023-12-13 DIAGNOSIS — M19072 Primary osteoarthritis, left ankle and foot: Secondary | ICD-10-CM | POA: Diagnosis not present

## 2023-12-17 ENCOUNTER — Telehealth: Payer: Self-pay | Admitting: *Deleted

## 2023-12-17 ENCOUNTER — Encounter: Payer: Self-pay | Admitting: Family Medicine

## 2023-12-17 ENCOUNTER — Ambulatory Visit (INDEPENDENT_AMBULATORY_CARE_PROVIDER_SITE_OTHER): Payer: PPO | Admitting: Family Medicine

## 2023-12-17 VITALS — BP 128/70 | HR 56 | Temp 97.7°F | Ht 64.75 in | Wt 168.5 lb

## 2023-12-17 DIAGNOSIS — F419 Anxiety disorder, unspecified: Secondary | ICD-10-CM | POA: Diagnosis not present

## 2023-12-17 DIAGNOSIS — Z87898 Personal history of other specified conditions: Secondary | ICD-10-CM | POA: Diagnosis not present

## 2023-12-17 DIAGNOSIS — F32A Depression, unspecified: Secondary | ICD-10-CM | POA: Diagnosis not present

## 2023-12-17 DIAGNOSIS — I10 Essential (primary) hypertension: Secondary | ICD-10-CM | POA: Diagnosis not present

## 2023-12-17 DIAGNOSIS — E785 Hyperlipidemia, unspecified: Secondary | ICD-10-CM

## 2023-12-17 DIAGNOSIS — K219 Gastro-esophageal reflux disease without esophagitis: Secondary | ICD-10-CM | POA: Diagnosis not present

## 2023-12-17 DIAGNOSIS — Z79899 Other long term (current) drug therapy: Secondary | ICD-10-CM | POA: Diagnosis not present

## 2023-12-17 DIAGNOSIS — Z0000 Encounter for general adult medical examination without abnormal findings: Secondary | ICD-10-CM

## 2023-12-17 DIAGNOSIS — M8580 Other specified disorders of bone density and structure, unspecified site: Secondary | ICD-10-CM

## 2023-12-17 DIAGNOSIS — Z8542 Personal history of malignant neoplasm of other parts of uterus: Secondary | ICD-10-CM

## 2023-12-17 MED ORDER — MECLIZINE HCL 25 MG PO TABS: 12.5000 mg | ORAL_TABLET | Freq: Two times a day (BID) | ORAL | 0 refills | Status: AC | PRN

## 2023-12-17 NOTE — Assessment & Plan Note (Signed)
 Pt has occational infrequent brief bouts of vertigo  Refilled meclizine  to keep on hand for prn use with caution of sedation

## 2023-12-17 NOTE — Progress Notes (Signed)
 Subjective:    Patient ID: Veronica Greene, female    DOB: 1946/01/18, 78 y.o.   MRN: [id number]  HPI  Here for health maintenance exam and to review chronic medical problems   Wt Readings from Last 3 Encounters:  12/17/23 168 lb 8 oz (76.4 kg)  11/11/23 171 lb (77.6 kg)  10/28/23 171 lb 8 oz (77.8 kg)   28.26 kg/m  Vitals:   12/17/23 1001  BP: 128/70  Pulse: (!) 56  Temp: 97.7 F (36.5 C)  SpO2: 97%    Immunization History  Administered Date(s) Administered   Fluad Quad(high Dose 65+) 05/06/2019, 05/04/2020, 03/30/2022   Influenza Split 04/08/2008, 06/30/2009   Influenza Whole 04/08/2008, 06/30/2009   Influenza, High Dose Seasonal PF 05/17/2015, 05/07/2016, 05/07/2017, 04/22/2018, 05/06/2019, 05/04/2020, 05/04/2021, 05/07/2023   Influenza-Unspecified 05/09/2014, 05/17/2015, 05/07/2016, 05/07/2017, 04/22/2018, 05/04/2021   PFIZER(Purple Top)SARS-COV-2 Vaccination 07/30/2019, 08/20/2019, 04/23/2020   Pneumococcal Conjugate-13 10/12/2014   Pneumococcal Polysaccharide-23 08/25/2010   Pneumococcal-Unspecified 08/25/2010   Respiratory Syncytial Virus Vaccine,Recomb Aduvanted(Arexvy) 05/17/2023   Td 10/15/2003   Td,absorbed, Preservative Free, Adult Use, Lf Unspecified 10/15/2003   Tdap 06/18/2018, 12/26/2022   Zoster, Live 08/12/2008    Health Maintenance Due  Topic Date Due   Zoster Vaccines- Shingrix (1 of 2) 02/23/1964   Medicare Annual Wellness (AWV)  12/18/2023   Very busy Husb has heart problems , but doing better  Trying to plan camping trip    Shingrix - may be interested   Mammogram 07/2023  Self breast exam- no lumps   Gyn health 2019 had D and C, polypectomy and hysteroscopy then hysterectomy   Note from Dr Orvil Bland 02/2023  (gyn onc)  The patient is now 5 years out from completion of adjuvant therapy for her high-intermediate risk endometrial cancer. She has done very well and is NED. She will be discharged from oncology care. I have recommended  that she have yearly follow-up with an OB/GYN. We reviewed signs and symptoms that would be concerning for cancer recurrence. I stressed the importance of calling if she develops any of these.  Pap smear is not recommended in routine endometrial cancer surveillance.    Colon cancer screening -colonoscopy 02/2023   Bone health  Dexa 07/2023 bmd in the normal range  Falls - fell last week in living room / falls frequently due to her leg length discrepancy    Fractures-none  Supplements = vit D  Last vitamin D  Lab Results  Component Value Date   VD25OH 45.48 10/05/2014    Exercise  Stationary bike -uses regularly   Will need left knee done      Mood    12/17/2023    8:11 PM 12/18/2022   11:05 AM 12/11/2022   10:07 AM 12/15/2021   10:26 AM 12/14/2020    2:19 PM  Depression screen PHQ 2/9  Decreased Interest 0 0 0 0 2  Down, Depressed, Hopeless 0 0 1 0 2  PHQ - 2 Score 0 0 1 0 4  Altered sleeping    0 0  Tired, decreased energy    0 0  Change in appetite    0 0  Feeling bad or failure about yourself     0 0  Trouble concentrating    0 0  Moving slowly or fidgety/restless    0 0  Suicidal thoughts    0 0  PHQ-9 Score    0 4  Difficult doing work/chores    Not difficult at all Not difficult  at all   Anxiety with depression   Effexor  xr 75 mg daily -doing well     HTN   in setting of valve dz and past palpitations (latest monitor did not show a fib)  Next echocardiogram planned for late fall   bp is stable today  No cp or palpitations or headaches or edema  No side effects to medicines  BP Readings from Last 3 Encounters:  12/17/23 128/70  11/11/23 (!) 126/46  10/28/23 (!) 148/65    Metoprolol  xl 50 mg daily Hydrochlorothiazide  25 mg daily  Amlodipine  10 mg daily  Lasix  20 mg prn  Pulse Readings from Last 3 Encounters:  12/17/23 (!) 56  11/11/23 71  10/28/23 66     Does take klor con 20 meq bid   Meloxicam  is on med list   Lab Results  Component Value  Date   NA 140 12/10/2023   K 4.0 12/10/2023   CO2 31 12/10/2023   GLUCOSE 95 12/10/2023   BUN 11 12/10/2023   CREATININE 0.61 12/10/2023   CALCIUM 9.8 12/10/2023   GFR 85.41 12/10/2023   GFRNONAA >60 05/16/2022   GERD Nexium  40 mg daily  Lab Results  Component Value Date   VITAMINB12 331 12/10/2023   Hyperlipidemia Lab Results  Component Value Date   CHOL 205 (H) 12/10/2023   CHOL 175 11/27/2022   CHOL 179 11/23/2021   Lab Results  Component Value Date   HDL 49.70 12/10/2023   HDL 50.50 11/27/2022   HDL 48.80 11/23/2021   Lab Results  Component Value Date   LDLCALC 122 (H) 12/10/2023   LDLCALC 99 11/27/2022   LDLCALC 100 (H) 11/23/2021   Lab Results  Component Value Date   TRIG 164.0 (H) 12/10/2023   TRIG 125.0 11/27/2022   TRIG 153.0 (H) 11/23/2021   Lab Results  Component Value Date   CHOLHDL 4 12/10/2023   CHOLHDL 3 11/27/2022   CHOLHDL 4 11/23/2021   No results found for: "LDLDIRECT"  Eating some fried chicken and fries  Avoids breakfast meats  Some ice cream    No medications Watched by cardiology -Dr Addie Holstein  Lab Results  Component Value Date   WBC 4.7 12/10/2023   HGB 12.6 12/10/2023   HCT 36.6 12/10/2023   MCV 86.6 12/10/2023   PLT 249.0 12/10/2023   Lab Results  Component Value Date   TSH 2.29 12/10/2023   Lab Results  Component Value Date   ALT 11 12/10/2023   AST 16 12/10/2023   ALKPHOS 54 12/10/2023   BILITOT 0.8 12/10/2023    Has occational vertigo -not very often  Occational has a spell and takes 1/2 meclizine   Sees ENT  Wears hearing aides      Patient Active Problem List   Diagnosis Date Noted   History of vertigo 12/17/2023   Routine general medical examination at a health care facility 12/17/2023   Current use of proton pump inhibitor 11/25/2023   Neck pain 10/28/2023   Chest pain of uncertain etiology 07/25/2023   Moderate mitral stenosis by prior echocardiogram 07/22/2023   Palpitations 04/30/2023    Auditory hallucination 08/28/2022   S/P total knee arthroplasty, right 05/22/2022   Laryngopharyngeal reflux 11/20/2021   Tinnitus 08/18/2020   Lower extremity edema 02/21/2020   S/P TAVR (transcatheter aortic valve replacement)    Poor balance 11/25/2018   History of endometrial cancer 09/12/2017   Essential hypertension 08/29/2017   Severe calcific aortic stenosis 08/27/2017   Estrogen deficiency 11/05/2016  Encounter for general adult medical examination with abnormal findings 10/15/2015   Benign paroxysmal positional vertigo 06/18/2014   Hyperlipidemia with target LDL less than 100 09/02/2012   Degenerative disk disease 08/31/2011   History of colonic polyps 11/03/2010   Gout 08/25/2010   DERMATITIS, SEBORRHEIC 08/25/2010   Osteopenia 09/05/2007   History of poliomyelitis 08/26/2007   Anxiety and depression 08/26/2007   ALLERGIC RHINITIS 08/26/2007   GERD 08/26/2007   IBS 08/26/2007   OVERACTIVE BLADDER 08/26/2007   OSTEOARTHRITIS, HANDS, BILATERAL 08/26/2007   URINARY INCONTINENCE 08/26/2007   Past Medical History:  Diagnosis Date   Anxiety and depression 08/26/2007   Qualifier: Diagnosis of  By: Malissa Se MD, Dannette Dutch    Arthritis    knees   Constipation, slow transit 11/03/2010   Degenerative disk disease 08/31/2011   Depression with anxiety    DERMATITIS, SEBORRHEIC 08/25/2010   Qualifier: Diagnosis of  By: Malissa Se MD, Dannette Dutch    Endometrial cancer [facility])    02/ 2019  Diagnosed with D&C/hysteroscopy with polypectomy   Essential hypertension 08/29/2017   Fibromyalgia    tx mobic    Full dentures    GERD 08/26/2007   Qualifier: Diagnosis of  By: Malissa Se MD, Dannette Dutch    Gout    Hearing loss    wears bilateral hearing aids   History of colon polyps    History of nonmelanoma skin cancer    right lower leg   History of poliomyelitis 08/26/2007   Qualifier: History of  By: Malissa Se MD, Dannette Dutch    History of radiation therapy    Endometium- 11/20/17-12/11/17- Vag  Cuff -Dr. Retta Caster   Hyperlipidemia, mild 09/02/2012   Hypertension    Hypokalemia 01/02/2011   IBS (irritable bowel syndrome)    with constipation   Obesity 08/26/2007   Qualifier: Diagnosis of  By: Malissa Se MD, Dannette Dutch    OSTEOARTHRITIS, HANDS, BILATERAL 08/26/2007   Qualifier: Diagnosis of  By: Malissa Se MD, Dannette Dutch    Osteopenia    OVERACTIVE BLADDER 08/26/2007   Qualifier: Diagnosis of  By: Malissa Se MD, Dannette Dutch    Personal history of colonic polyps 11/03/2010   05/2018 13 polyps mostly adenomas, ssp's - recall 1 year 2020 Kenney Peacemaker, MD, [facility]    Poor balance 11/25/2018   With falls  Has rise in R shoe/post polio  Weak legs as well     S/P TAVR (transcatheter aortic valve replacement)    s/p TAVR with a 23mm Edwards S3U via the TF approach by Dr. Sherene Dilling and Dr. Abel Hoe   Severe calcific aortic stenosis 08/27/2017    -> Became severe as of October 2020-referred for TAVR on 04/28/2019   URINARY INCONTINENCE 08/26/2007   Qualifier: Diagnosis of  By: Malissa Se MD, Dannette Dutch    Wears hearing aid in both ears    Past Surgical History:  Procedure Laterality Date   ANKLE SURGERY Right 1956   CARPAL TUNNEL RELEASE Bilateral 1986, 1993   CATARACT EXTRACTION Right 11/16/2019   COLONOSCOPY N/A 06/24/2015   Procedure: COLONOSCOPY;  Surgeon: Kenney Peacemaker, MD;  Location: [facility];  Service: Endoscopy;  Laterality: N/A;   DILATATION & CURETTAGE/HYSTEROSCOPY WITH MYOSURE N/A 08/09/2017   Procedure: DILATATION & CURETTAGE/HYSTEROSCOPY WITH MYOSURE;  Surgeon: Arlee Lace, MD;  Location: [facility];  Service: Gynecology;  Laterality: N/A;  Polypectomy   DILATION AND CURETTAGE OF UTERUS     PMB   HAND SURGERY Bilateral 1999   Thumbs    KNEE SURGERY  Bilateral 1998   x2   LYMPH NODE BIOPSY N/A 10/08/2017   Procedure: LYMPH NODE BIOPSY;  Surgeon: Alphonso Aschoff, MD;  Location: [facility];  Service: Gynecology;  Laterality: N/A;   MULTIPLE TOOTH EXTRACTIONS     NASAL SINUS SURGERY  1976   polio  surgery.     right foot - right leg 1.5" shorter than left leg - some wekness   RIGHT/LEFT HEART CATH AND CORONARY ANGIOGRAPHY N/A 04/09/2019   Procedure: RIGHT/LEFT HEART CATH AND CORONARY ANGIOGRAPHY;  Surgeon: Arleen Lacer, MD;  Location: [facility] INVASIVE CV LAB;  Service: Cardiovascular;; Severe AS, mean gradient 50 mmHg P-P 55 mmHg (estimated AVA 0.76 cm).  Mildly elevated pulmonary pressures with elevated LVEDP and PCWP.  Angiographically normal coronary arteries, but tortuous.   ROBOTIC ASSISTED TOTAL HYSTERECTOMY WITH BILATERAL SALPINGO OOPHERECTOMY Bilateral 10/08/2017   Procedure: XI ROBOTIC ASSISTED TOTAL HYSTERECTOMY WITH BILATERAL SALPINGO OOPHORECTOMY;  Surgeon: Alphonso Aschoff, MD;  Location: [facility];  Service: Gynecology;  Laterality: Bilateral;   TEE WITHOUT CARDIOVERSION N/A 04/28/2019   Procedure: TRANSESOPHAGEAL ECHOCARDIOGRAM (TEE);  Surgeon: Odie Benne, MD;  Location: [facility] INVASIVE CV LAB;  Service: Open Heart Surgery;  Laterality: N/A;   THUMB ARTHROSCOPY  1999   TONSILLECTOMY  7119   78 years old   TOTAL KNEE ARTHROPLASTY Right 05/22/2022   Procedure: TOTAL KNEE ARTHROPLASTY;  Surgeon: Saundra Curl, MD;  Location: [facility];  Service: Orthopedics;  Laterality: Right;   TRANSCATHETER AORTIC VALVE REPLACEMENT, TRANSFEMORAL N/A 04/28/2019   Procedure: TRANSCATHETER AORTIC VALVE REPLACEMENT, TRANSFEMORAL;  Surgeon: Odie Benne, MD;  Location: [facility] INVASIVE CV LAB; ; 23 mm Sapien 3 Ultra THV via TF approach. (post-op mean AV gradient 13 mmHg)   TRANSTHORACIC ECHOCARDIOGRAM  05/15/2021   Normal/stable LV size and function.  EF 65 to 70%.  No RWMA.  GR 2 DD.  Moderate LA dilation.  Normal RV with mild RA dilation, normal RAP.Aaron Aas  Severe MAC with no MR.  23 mm SAPIEN prosthetic TAVR valve well-seated.  Mean AVG 18 mm..  Trivial AI.  (No significant change from 05/04/2020-mean AVG 14 mmHg).   TRANSTHORACIC ECHOCARDIOGRAM  02/17/2019   Mod LVH. GR 2 DD. Severe AS (mean  gradient = 44 mmHg with Mod AI. mild MS.   TRANSTHORACIC ECHOCARDIOGRAM  04/29/2019; 05/2020   a)  (post TAVR): EF 60-65%.  GRII DD.  Mod LA dilation & nl RA.  Mod MAC.  No  AI (no-PVL), mean AV gradient 13 mmHg.;; b) (2nd post TAVR) EF 60 to 65%.  Severely increased thickness/LVH.  GRII DD with only mildly elevated left atrial pressure.  Normal RA size.  Severe MAC with only trace MR.  No MS.  TAVR valve prosthesis functioning well.  Trivial AI.  Mean gradient 15 mmHg with a peak 29.8 mmHg   TUBAL LIGATION     UPPER GI ENDOSCOPY     normal   WISDOM TOOTH EXTRACTION     Social History   Tobacco Use   Smoking status: Never    Passive exposure: Past   Smokeless tobacco: Never  Vaping Use   Vaping status: Never Used  Substance Use Topics   Alcohol  use: No    Alcohol /week: 0.0 standard drinks of alcohol    Drug use: No   Family History  Problem Relation Age of Onset   Kidney disease Mother    Alzheimer's disease Mother    Stroke Father 73   Colon cancer Father 59  possibly colon cancer, possibly just polyps   Kidney disease Sister    Heart attack Sister    Colon cancer Paternal Aunt 4   Cancer Paternal Uncle        unk type   Esophageal cancer Neg Hx    Stomach cancer Neg Hx    Rectal cancer Neg Hx    Breast cancer Neg Hx    Ovarian cancer Neg Hx    Endometrial cancer Neg Hx    Pancreatic cancer Neg Hx    Prostate cancer Neg Hx    Colon polyps Neg Hx    Allergies  Allergen Reactions   Clarithromycin Swelling and Other (See Comments)    Throat Swells   Penicillins Hives and Other (See Comments)    WELTS Has patient had a PCN reaction causing immediate rash, facial/tongue/throat swelling, SOB or lightheadedness with hypotension: No Has patient had a PCN reaction causing severe rash involving mucus membranes or skin necrosis: Unknown Has patient had a PCN reaction that required hospitalization:No Has patient had a PCN reaction occurring within the last 10 years:  No If all of the above answers are "NO", then may proceed with Cephalosporin use.    Other     Band-Aid, leaves mark on skin   Codeine Nausea And Vomiting   Current Outpatient Medications on File Prior to Visit  Medication Sig Dispense Refill   amLODipine  (NORVASC ) 10 MG tablet TAKE 1 TABLET (10 MG TOTAL) BY MOUTH DAILY. 90 tablet 3   Ascorbic Acid (VITAMIN C) 1000 MG tablet Take 1,000 mg by mouth daily with breakfast.     aspirin  EC 81 MG tablet Take 1 tablet (81 mg total) by mouth 2 (two) times daily. To prevent blood clots for 30 days after surgery. 60 tablet 0   Black Pepper-Turmeric (TURMERIC COMPLEX/BLACK PEPPER PO) Take 15 mLs by mouth daily with breakfast.     cetirizine (ZYRTEC) 10 MG tablet Take 10 mg by mouth daily.     cholecalciferol (VITAMIN D ) 1000 UNITS tablet Take 1,000 Units by mouth daily with breakfast.      fluticasone  (FLONASE ) 50 MCG/ACT nasal spray Place 2 sprays into both nostrils at bedtime.      furosemide  (LASIX ) 20 MG tablet MAY TAKE 1 TABLET UP TO THREE TIMES A WEEK. DO NOT TAKE HCTZ THAT DAY. 36 tablet 3   hydrochlorothiazide  (HYDRODIURIL ) 25 MG tablet Take 1 tablet (25 mg total) by mouth See admin instructions. Take 25 mg by mouth Sunday, Monday, Wednesday and Friday 90 tablet 2   lubiprostone  (AMITIZA ) 24 MCG capsule TAKE 1 CAPSULE (24 MCG TOTAL) BY MOUTH 2 (TWO) TIMES DAILY. LUNCH & SUPPER 180 capsule 3   meloxicam  (MOBIC ) 7.5 MG tablet TAKE 1 TABLET (7.5 MG TOTAL) BY MOUTH AT BEDTIME AS NEEDED FOR PAIN. TAKE WITH FOOD. 90 tablet 1   metoprolol  succinate (TOPROL -XL) 50 MG 24 hr tablet Take 1 tablet (50 mg total) by mouth daily after supper. Take with or immediately following a meal. 90 tablet 3   Multiple Vitamin (MULTI-VITAMIN) tablet Take 1 tablet by mouth daily.     NEXIUM  40 MG capsule Take 40 mg by mouth in the morning.     Polyethyl Glycol-Propyl Glycol 0.4-0.3 % SOLN Place 1-2 drops into both eyes at bedtime.     potassium chloride  (KLOR-CON ) 10 MEQ  tablet TAKE 2 TABLETS BY MOUTH 2 TIMES DAILY. 360 tablet 0   Simethicone 180 MG CAPS Take 1 capsule by mouth as needed.  triamcinolone  cream (KENALOG ) 0.1 % Apply topically 2 (two) times daily. To affected area/ rash (Patient taking differently: Apply 1 Application topically as needed. To affected area/ rash) 30 g 0   venlafaxine  XR (EFFEXOR -XR) 75 MG 24 hr capsule TAKE 1 CAPSULE (75 MG TOTAL) BY MOUTH DAILY AFTER BREAKFAST. 90 capsule 3   No current facility-administered medications on file prior to visit.    Review of Systems  Constitutional:  Negative for activity change, appetite change, fatigue, fever and unexpected weight change.  HENT:  Negative for congestion, ear pain, rhinorrhea, sinus pressure and sore throat.   Eyes:  Negative for pain, redness and visual disturbance.  Respiratory:  Negative for cough, shortness of breath and wheezing.   Cardiovascular:  Negative for chest pain and palpitations.  Gastrointestinal:  Negative for abdominal pain, blood in stool, constipation and diarrhea.  Endocrine: Negative for polydipsia and polyuria.  Genitourinary:  Negative for dysuria, frequency and urgency.  Musculoskeletal:  Positive for arthralgias. Negative for back pain and myalgias.  Skin:  Negative for pallor and rash.  Allergic/Immunologic: Negative for environmental allergies.  Neurological:  Negative for dizziness, syncope and headaches.  Hematological:  Negative for adenopathy. Does not bruise/bleed easily.  Psychiatric/Behavioral:  Negative for decreased concentration and dysphoric mood. The patient is not nervous/anxious.        Objective:   Physical Exam Constitutional:      General: She is not in acute distress.    Appearance: Normal appearance. She is well-developed. She is not ill-appearing or diaphoretic.  HENT:     Head: Normocephalic and atraumatic.     Right Ear: Tympanic membrane, ear canal and external ear normal.     Left Ear: Tympanic membrane, ear canal  and external ear normal.     Nose: Nose normal. No congestion.     Mouth/Throat:     Mouth: Mucous membranes are moist.     Pharynx: Oropharynx is clear. No posterior oropharyngeal erythema.  Eyes:     General: No scleral icterus.    Extraocular Movements: Extraocular movements intact.     Conjunctiva/sclera: Conjunctivae normal.     Pupils: Pupils are equal, round, and reactive to light.  Neck:     Thyroid : No thyromegaly.     Vascular: No carotid bruit or JVD.  Cardiovascular:     Rate and Rhythm: Normal rate and regular rhythm.     Pulses: Normal pulses.     Heart sounds: Normal heart sounds.     No gallop.  Pulmonary:     Effort: Pulmonary effort is normal. No respiratory distress.     Breath sounds: Normal breath sounds. No wheezing.     Comments: Good air exch Chest:     Chest wall: No tenderness.  Abdominal:     General: Bowel sounds are normal. There is no distension or abdominal bruit.     Palpations: Abdomen is soft. There is no mass.     Tenderness: There is no abdominal tenderness.     Hernia: No hernia is present.  Genitourinary:    Comments: Breast exam: No mass, nodules, thickening, tenderness, bulging, retraction, inflamation, nipple discharge or skin changes noted.  No axillary or clavicular LA.     Musculoskeletal:        General: No tenderness. Normal range of motion.     Cervical back: Normal range of motion and neck supple. No rigidity. No muscular tenderness.     Right lower leg: No edema.     Left lower  leg: No edema.     Comments: No kyphosis   Lymphadenopathy:     Cervical: No cervical adenopathy.  Skin:    General: Skin is warm and dry.     Coloration: Skin is not pale.     Findings: No erythema or rash.     Comments: Solar lentigines diffusely Scattered sks   Neurological:     Mental Status: She is alert. Mental status is at baseline.     Cranial Nerves: No cranial nerve deficit.     Sensory: No sensory deficit.     Motor: No weakness or  abnormal muscle tone.     Coordination: Coordination normal.     Gait: Gait normal.     Deep Tendon Reflexes: Reflexes are normal and symmetric. Reflexes normal.  Psychiatric:        Mood and Affect: Mood normal.        Cognition and Memory: Cognition and memory normal.           Assessment & Plan:   Problem List Items Addressed This Visit       Cardiovascular and Mediastinum   Essential hypertension (Chronic)   bp in fair control at this time  BP Readings from Last 1 Encounters:  12/17/23 128/70   No changes needed Most recent labs reviewed  Disc lifstyle change with low sodium diet and exercise  Metoprolol  xl 50 mg daily Hydrochlorothiazide  25 mg daily  Amlodipine  10 mg daily  Lasix  20 mg prn  Continues klor con as well        Digestive   GERD   Nexium  40 mg daily  Also for GI protection from meloxicam   Well controlled  Has not been able to stop this        Relevant Medications   meclizine  (ANTIVERT ) 25 MG tablet     Musculoskeletal and Integument   Osteopenia   Dexa 07/2023-bmd was in the normal range  One fall/ no fracture taking vitamin D  Discussed fall prevention, supplements and exercise for bone density   Encouraged to add strength training exercise when able         Other   Hyperlipidemia with target LDL less than 100 (Chronic)   Disc goals for lipids and reasons to control them Rev last labs with pt Rev low sat fat diet in detail LDL of 122-this is up from 99 Has eaten some fried food/ice cream   Will work on diet and re check  Also watched by cardiology      Routine general medical examination at a health care facility - Primary   Reviewed health habits including diet and exercise and skin cancer prevention Reviewed appropriate screening tests for age  Also reviewed health mt list, fam hx and immunization status , as well as social and family history   See HPI Labs reviewed and ordered Health Maintenance  Topic Date Due   Zoster  (Shingles) Vaccine (1 of 2) 02/23/1964   Medicare Annual Wellness Visit  12/18/2023   COVID-19 Vaccine (4 - 2024-25 season) 11/12/2024*   Flu Shot  02/07/2024   Mammogram  07/16/2024   DTaP/Tdap/Td vaccine (5 - Td or Tdap) 12/25/2032   Pneumonia Vaccine  Completed   DEXA scan (bone density measurement)  Completed   Hepatitis C Screening  Completed   HPV Vaccine  Aged Out   Meningitis B Vaccine  Aged Out   Colon Cancer Screening  Discontinued  *Topic was postponed. The date shown is not the original due  date.    Considering shingrix  Planning to get established with gyn  Discussed fall prevention, supplements and exercise for bone density  PHQ 0       History of vertigo   Pt has occational infrequent brief bouts of vertigo  Refilled meclizine  to keep on hand for prn use with caution of sedation       History of endometrial cancer   Reviewed last gyn/onc note Recommended she should see a gyn yearly (but no pap)  No complaints  Pt plans to call her previous practice, will let us  know if referral is needed       Current use of proton pump inhibitor   Lab Results  Component Value Date   VITAMINB12 331 12/10/2023   Last vitamin D  Lab Results  Component Value Date   VD25OH 45.48 10/05/2014         Anxiety and depression   Continues effexor  xr 75 mg daily  PHQ 0  Overall doing well

## 2023-12-17 NOTE — Telephone Encounter (Signed)
 Please schedule fasting lab appt prior to CPE for next year

## 2023-12-17 NOTE — Assessment & Plan Note (Signed)
 Reviewed last gyn/onc note Recommended she should see a gyn yearly (but no pap)  No complaints  Pt plans to call her previous practice, will let us  know if referral is needed

## 2023-12-17 NOTE — Patient Instructions (Addendum)
 If you are interested in the new shingles vaccine (Shingrix) - call your local pharmacy to check on coverage and availability    You need to see a gynecologist yearly  Let us  know who you saw before and I can do a referral if needed   Pick up all the rugs in your house to prevent falls   Stay active  Add some strength training to your routine, this is important for bone and brain health and can reduce your risk of falls and help your body use insulin properly and regulate weight  Light weights, exercise bands , and internet videos are a good way to start  Yoga (chair or regular), machines , floor exercises or a gym with machines are also good options   Cholesterol is up  For cholesterol try to avoid  Avoid red meat/ fried foods/ egg yolks/ fatty breakfast meats/ butter, cheese and high fat dairy/ and shellfish

## 2023-12-17 NOTE — Assessment & Plan Note (Signed)
 Reviewed health habits including diet and exercise and skin cancer prevention Reviewed appropriate screening tests for age  Also reviewed health mt list, fam hx and immunization status , as well as social and family history   See HPI Labs reviewed and ordered Health Maintenance  Topic Date Due   Zoster (Shingles) Vaccine (1 of 2) 02/23/1965   Medicare Annual Wellness Visit  12/18/2023   COVID-19 Vaccine (4 - 2024-25 season) 11/12/2024*   Flu Shot  02/07/2024   Mammogram  07/16/2024   DTaP/Tdap/Td vaccine (5 - Td or Tdap) 12/25/2032   Pneumonia Vaccine  Completed   DEXA scan (bone density measurement)  Completed   Hepatitis C Screening  Completed   HPV Vaccine  Aged Out   Meningitis B Vaccine  Aged Out   Colon Cancer Screening  Discontinued  *Topic was postponed. The date shown is not the original due date.    Considering shingrix  Planning to get established with gyn  Discussed fall prevention, supplements and exercise for bone density  PHQ 0

## 2023-12-17 NOTE — Assessment & Plan Note (Signed)
 Lab Results  Component Value Date   VITAMINB12 331 12/10/2023   Last vitamin D  Lab Results  Component Value Date   VD25OH 45.48 10/05/2014

## 2023-12-17 NOTE — Assessment & Plan Note (Signed)
 Nexium  40 mg daily  Also for GI protection from meloxicam   Well controlled  Has not been able to stop this

## 2023-12-17 NOTE — Assessment & Plan Note (Signed)
 Dexa 07/2023-bmd was in the normal range  One fall/ no fracture taking vitamin D  Discussed fall prevention, supplements and exercise for bone density   Encouraged to add strength training exercise when able

## 2023-12-17 NOTE — Telephone Encounter (Signed)
 Copied from CRM [id number]. Topic: Clinical - Request for Lab/Test Order >> Dec 17, 2023  3:29 PM Luane Rumps D wrote: Reason for CRM: Patient is requesting lab work order for upcoming physical in 2026. Please call when order is in so patient can schedule.

## 2023-12-17 NOTE — Assessment & Plan Note (Addendum)
 Continues effexor  xr 75 mg daily  PHQ 0  Overall doing well

## 2023-12-17 NOTE — Assessment & Plan Note (Signed)
 Disc goals for lipids and reasons to control them Rev last labs with pt Rev low sat fat diet in detail LDL of 122-this is up from 99 Has eaten some fried food/ice cream   Will work on diet and re check  Also watched by cardiology

## 2023-12-17 NOTE — Assessment & Plan Note (Signed)
 bp in fair control at this time  BP Readings from Last 1 Encounters:  12/17/23 128/70   No changes needed Most recent labs reviewed  Disc lifstyle change with low sodium diet and exercise  Metoprolol  xl 50 mg daily Hydrochlorothiazide  25 mg daily  Amlodipine  10 mg daily  Lasix  20 mg prn  Continues klor con as well

## 2023-12-18 NOTE — Telephone Encounter (Signed)
 Copied from CRM [id number]. Topic: Clinical - Request for Lab/Test Order >> Dec 18, 2023 12:07 PM Veronica Greene wrote: Reason for CRM: Patient would like to schedule her next year's blood work for next year's physical.

## 2023-12-18 NOTE — Telephone Encounter (Signed)
 lvm for pt to call office to schedule appt her pt husband answer the phone and stated that he will have pt to call the house

## 2023-12-19 DIAGNOSIS — D485 Neoplasm of uncertain behavior of skin: Secondary | ICD-10-CM | POA: Diagnosis not present

## 2023-12-19 DIAGNOSIS — C44722 Squamous cell carcinoma of skin of right lower limb, including hip: Secondary | ICD-10-CM | POA: Diagnosis not present

## 2023-12-19 NOTE — Telephone Encounter (Signed)
 Pt is schedule for lab work to be done Monday the 6/16 for cpe blood work

## 2023-12-19 NOTE — Telephone Encounter (Signed)
 Noted

## 2023-12-20 NOTE — Telephone Encounter (Signed)
 Copied from CRM [id number]. Topic: Clinical - Medication Question >> Dec 20, 2023 10:55 AM Danae Duncans wrote: Reason for CRM: pt would like to know why she is scheduled Monday for labs that is to be taken for next year physical, want to know should she be schedule when it closer to physical date for labs or if she should in fact come 12/23/2023. Pt would like to be contacted [phone]

## 2023-12-20 NOTE — Telephone Encounter (Signed)
 See prev note pt shouldn't have labs done on Monday pt needs a lab appt prior to CPE next year. Please r/s lab appt. Thanks

## 2023-12-20 NOTE — Telephone Encounter (Signed)
 Appt for labs is corrected

## 2023-12-22 ENCOUNTER — Telehealth: Payer: Self-pay | Admitting: Family Medicine

## 2023-12-22 DIAGNOSIS — I10 Essential (primary) hypertension: Secondary | ICD-10-CM

## 2023-12-22 NOTE — Telephone Encounter (Signed)
-----   Message from Gerry Krone sent at 12/19/2023  2:34 PM EDT ----- Regarding: Lab orders for Mon, 6.16.25 Patient is scheduled for CPX labs, please order future labs, Thanks , Anselmo Kings

## 2023-12-23 ENCOUNTER — Other Ambulatory Visit

## 2023-12-23 ENCOUNTER — Telehealth: Payer: Self-pay

## 2023-12-23 NOTE — Telephone Encounter (Signed)
 Copied from CRM [id number]. Topic: Clinical - Medication Question >> Dec 20, 2023 10:55 AM Danae Duncans wrote: Reason for CRM: pt would like to know why she is scheduled Monday for labs that is to be taken for next year physical, want to know should she be schedule when it closer to physical date for labs or if she should in fact come 12/23/2023. Pt would like to be contacted [phone] >> Dec 23, 2023  8:12 AM Emylou G wrote: Pls call patient in case it's different.. I did adv her no appt for labs til next year.

## 2023-12-24 ENCOUNTER — Other Ambulatory Visit: Payer: Self-pay | Admitting: *Deleted

## 2023-12-24 MED ORDER — HYDROCHLOROTHIAZIDE 25 MG PO TABS: 25.0000 mg | ORAL_TABLET | ORAL | 2 refills | Status: AC

## 2023-12-31 DIAGNOSIS — L231 Allergic contact dermatitis due to adhesives: Secondary | ICD-10-CM | POA: Diagnosis not present

## 2023-12-31 DIAGNOSIS — L080 Pyoderma: Secondary | ICD-10-CM | POA: Diagnosis not present

## 2023-12-31 DIAGNOSIS — C4492 Squamous cell carcinoma of skin, unspecified: Secondary | ICD-10-CM | POA: Diagnosis not present

## 2024-01-01 ENCOUNTER — Encounter: Payer: Self-pay | Admitting: Podiatry

## 2024-01-01 ENCOUNTER — Ambulatory Visit: Admitting: Podiatry

## 2024-01-01 ENCOUNTER — Ambulatory Visit (INDEPENDENT_AMBULATORY_CARE_PROVIDER_SITE_OTHER)

## 2024-01-01 VITALS — Ht 64.75 in | Wt 168.0 lb

## 2024-01-01 DIAGNOSIS — M2042 Other hammer toe(s) (acquired), left foot: Secondary | ICD-10-CM

## 2024-01-01 DIAGNOSIS — M2041 Other hammer toe(s) (acquired), right foot: Secondary | ICD-10-CM | POA: Diagnosis not present

## 2024-01-01 DIAGNOSIS — L600 Ingrowing nail: Secondary | ICD-10-CM

## 2024-01-01 DIAGNOSIS — M7752 Other enthesopathy of left foot: Secondary | ICD-10-CM

## 2024-01-01 DIAGNOSIS — M7751 Other enthesopathy of right foot: Secondary | ICD-10-CM

## 2024-01-02 NOTE — Progress Notes (Signed)
 Subjective:   Patient ID: Veronica Greene, female   DOB: 78 y.o.   MRN: [id number]   HPI Patient states that she had a lot of pain in both feet especially the left and it was very sharp and she could not bear weight for several days and it seems somewhat better now and she has a very thickened second nail right that been painful and she was concerned about   ROS      Objective:  Physical Exam  Neurovascular status was found to be intact no changes in health history with the patient found to have forefoot pain which appeared to be within the metatarsals bilateral localized that upon palpation did not show significant discomfort now she states there is been improvement.  Right second nail is very thickened and painful     Assessment:  Cannot rule out bony injury versus inflammatory condition with significant discomfort which occurred with a damaged right second nailbed that has thickened and is painful     Plan:  H&P reviewed both conditions and for the feet I did evaluate her x-rays I then discussed good support therapy and stretching exercises.  If symptoms come back again I want to see her back and I did courtesy debridement of right second nail discussed nail removal which may need to be done in future and I explained recovery  X-rays were negative for fracture or indications of advanced arthritis

## 2024-01-08 DIAGNOSIS — L239 Allergic contact dermatitis, unspecified cause: Secondary | ICD-10-CM | POA: Diagnosis not present

## 2024-01-08 DIAGNOSIS — L080 Pyoderma: Secondary | ICD-10-CM | POA: Diagnosis not present

## 2024-01-13 ENCOUNTER — Other Ambulatory Visit: Payer: Self-pay | Admitting: Family Medicine

## 2024-02-10 DIAGNOSIS — C44722 Squamous cell carcinoma of skin of right lower limb, including hip: Secondary | ICD-10-CM | POA: Diagnosis not present

## 2024-02-11 ENCOUNTER — Encounter

## 2024-02-20 ENCOUNTER — Ambulatory Visit
Admission: EM | Admit: 2024-02-20 | Discharge: 2024-02-20 | Disposition: A | Discharge status: Home / Self Care | Attending: Physician Assistant | Admitting: Physician Assistant

## 2024-02-20 ENCOUNTER — Encounter: Payer: Self-pay | Admitting: Emergency Medicine

## 2024-02-20 DIAGNOSIS — L03115 Cellulitis of right lower limb: Secondary | ICD-10-CM | POA: Diagnosis not present

## 2024-02-20 MED ORDER — DOXYCYCLINE HYCLATE 100 MG PO CAPS
100.0000 mg | ORAL_CAPSULE | Freq: Two times a day (BID) | ORAL | 0 refills | Status: AC
Start: 2024-02-20 — End: 2024-02-27

## 2024-02-20 NOTE — ED Provider Notes (Signed)
 [facility]    CSN: [id number] Arrival date & time: 02/20/24  1148      History   Chief Complaint Chief Complaint  Patient presents with   Rash    HPI Veronica Greene is a 78 y.o. female.   Patient here today for evaluation of redness and swelling to her right lower leg that started after she had surgery last Monday.  She reports some itching to the area.  She has not had any fever.  The history is provided by the patient.  Rash Associated symptoms: no abdominal pain, no fever, no nausea, no shortness of breath and not vomiting     Past Medical History:  Diagnosis Date   Anxiety and depression 08/26/2007   Qualifier: Diagnosis of  By: Randeen MD, Laine Jenkins    Arthritis    knees   Constipation, slow transit 11/03/2010   Degenerative disk disease 08/31/2011   Depression with anxiety    DERMATITIS, SEBORRHEIC 08/25/2010   Qualifier: Diagnosis of  By: Randeen MD, Laine Jenkins    Endometrial cancer [facility])    02/ 2019  Diagnosed with D&C/hysteroscopy with polypectomy   Essential hypertension 08/29/2017   Fibromyalgia    tx mobic   Full dentures    GERD 08/26/2007   Qualifier: Diagnosis of  By: Randeen MD, Laine Jenkins    Gout    Hearing loss    wears bilateral hearing aids   History of colon polyps    History of nonmelanoma skin cancer    right lower leg   History of poliomyelitis 08/26/2007   Qualifier: History of  By: Randeen MD, Laine Jenkins    History of radiation therapy    Endometium- 11/20/17-12/11/17- Vag Cuff -Dr. Lynwood Nasuti   Hyperlipidemia, mild 09/02/2012   Hypertension    Hypokalemia 01/02/2011   IBS (irritable bowel syndrome)    with constipation   Obesity 08/26/2007   Qualifier: Diagnosis of  By: Randeen MD, Laine Jenkins    OSTEOARTHRITIS, HANDS, BILATERAL 08/26/2007   Qualifier: Diagnosis of  By: Randeen MD, Laine Jenkins    Osteopenia    OVERACTIVE BLADDER 08/26/2007   Qualifier: Diagnosis of  By: Randeen MD, Laine Jenkins    Personal history of colonic  polyps 11/03/2010   05/2018 13 polyps mostly adenomas, ssp's - recall 1 year 2020 Lupita CHARLENA Commander, MD, [facility]    Poor balance 11/25/2018   With falls  Has rise in R shoe/post polio  Weak legs as well     S/P TAVR (transcatheter aortic valve replacement)    s/p TAVR with a 23mm Edwards S3U via the TF approach by Dr. Lucas and Dr. Verlin   Severe calcific aortic stenosis 08/27/2017    -> Became severe as of October 2020-referred for TAVR on 04/28/2019   URINARY INCONTINENCE 08/26/2007   Qualifier: Diagnosis of  By: Randeen MD, Laine Jenkins    Wears hearing aid in both ears     Patient Active Problem List   Diagnosis Date Noted   History of vertigo 12/17/2023   Routine general medical examination at a health care facility 12/17/2023   Current use of proton pump inhibitor 11/25/2023   Neck pain 10/28/2023   Chest pain of uncertain etiology 07/25/2023   Moderate mitral stenosis by prior echocardiogram 07/22/2023   Palpitations 04/30/2023   Auditory hallucination 08/28/2022   S/P total knee arthroplasty, right 05/22/2022   Laryngopharyngeal reflux 11/20/2021   Tinnitus 08/18/2020   Lower extremity edema 02/21/2020  S/P TAVR (transcatheter aortic valve replacement)    Poor balance 11/25/2018   History of endometrial cancer 09/12/2017   Essential hypertension 08/29/2017   Severe calcific aortic stenosis 08/27/2017   Estrogen deficiency 11/05/2016   Encounter for general adult medical examination with abnormal findings 10/15/2015   Benign paroxysmal positional vertigo 06/18/2014   Hyperlipidemia with target LDL less than 100 09/02/2012   Degenerative disk disease 08/31/2011   History of colonic polyps 11/03/2010   Gout 08/25/2010   DERMATITIS, SEBORRHEIC 08/25/2010   Osteopenia 09/05/2007   History of poliomyelitis 08/26/2007   Anxiety and depression 08/26/2007   ALLERGIC RHINITIS 08/26/2007   GERD 08/26/2007   IBS 08/26/2007   OVERACTIVE BLADDER 08/26/2007   OSTEOARTHRITIS, HANDS,  BILATERAL 08/26/2007   URINARY INCONTINENCE 08/26/2007    Past Surgical History:  Procedure Laterality Date   ANKLE SURGERY Right 1956   CARPAL TUNNEL RELEASE Bilateral 1986, 1993   CATARACT EXTRACTION Right 11/16/2019   COLONOSCOPY N/A 06/24/2015   Procedure: COLONOSCOPY;  Surgeon: Lupita FORBES Commander, MD;  Location: [facility];  Service: Endoscopy;  Laterality: N/A;   DILATATION & CURETTAGE/HYSTEROSCOPY WITH MYOSURE N/A 08/09/2017   Procedure: DILATATION & CURETTAGE/HYSTEROSCOPY WITH MYOSURE;  Surgeon: Rosalva Sawyer, MD;  Location: [facility];  Service: Gynecology;  Laterality: N/A;  Polypectomy   DILATION AND CURETTAGE OF UTERUS     PMB   HAND SURGERY Bilateral 1999   Thumbs    KNEE SURGERY Bilateral 1998   x2   LYMPH NODE BIOPSY N/A 10/08/2017   Procedure: LYMPH NODE BIOPSY;  Surgeon: Eloy Herring, MD;  Location: [facility];  Service: Gynecology;  Laterality: N/A;   MULTIPLE TOOTH EXTRACTIONS     NASAL SINUS SURGERY  1976   polio surgery.     right foot - right leg 1.5 shorter than left leg - some wekness   RIGHT/LEFT HEART CATH AND CORONARY ANGIOGRAPHY N/A 04/09/2019   Procedure: RIGHT/LEFT HEART CATH AND CORONARY ANGIOGRAPHY;  Surgeon: Anner Alm ORN, MD;  Location: [facility] INVASIVE CV LAB;  Service: Cardiovascular;; Severe AS, mean gradient 50 mmHg P-P 55 mmHg (estimated AVA 0.76 cm).  Mildly elevated pulmonary pressures with elevated LVEDP and PCWP.  Angiographically normal coronary arteries, but tortuous.   ROBOTIC ASSISTED TOTAL HYSTERECTOMY WITH BILATERAL SALPINGO OOPHERECTOMY Bilateral 10/08/2017   Procedure: XI ROBOTIC ASSISTED TOTAL HYSTERECTOMY WITH BILATERAL SALPINGO OOPHORECTOMY;  Surgeon: Eloy Herring, MD;  Location: [facility];  Service: Gynecology;  Laterality: Bilateral;   TEE WITHOUT CARDIOVERSION N/A 04/28/2019   Procedure: TRANSESOPHAGEAL ECHOCARDIOGRAM (TEE);  Surgeon: Verlin Lonni BIRCH, MD;  Location[facility] INVASIVE CV LAB;  Service: Open Heart Surgery;  Laterality: N/A;    THUMB ARTHROSCOPY  1999   TONSILLECTOMY  2968   78 years old   TOTAL KNEE ARTHROPLASTY Right 05/22/2022   Procedure: TOTAL KNEE ARTHROPLASTY;  Surgeon: Beverley Evalene BIRCH, MD;  Location: [facility];  Service: Orthopedics;  Laterality: Right;   TRANSCATHETER AORTIC VALVE REPLACEMENT, TRANSFEMORAL N/A 04/28/2019   Procedure: TRANSCATHETER AORTIC VALVE REPLACEMENT, TRANSFEMORAL;  Surgeon: Verlin Lonni BIRCH, MD;  Location: [facility] INVASIVE CV LAB; ; 23 mm Sapien 3 Ultra THV via TF approach. (post-op mean AV gradient 13 mmHg)   TRANSTHORACIC ECHOCARDIOGRAM  05/15/2021   Normal/stable LV size and function.  EF 65 to 70%.  No RWMA.  GR 2 DD.  Moderate LA dilation.  Normal RV with mild RA dilation, normal RAP.SABRA  Severe MAC with no MR.  23 mm SAPIEN prosthetic TAVR valve well-seated.  Mean AVG 18 mm.SABRA  Trivial AI.  (No significant change from 05/04/2020-mean AVG 14 mmHg).   TRANSTHORACIC ECHOCARDIOGRAM  02/17/2019   Mod LVH. GR 2 DD. Severe AS (mean gradient = 44 mmHg with Mod AI. mild MS.   TRANSTHORACIC ECHOCARDIOGRAM  04/29/2019; 05/2020   a)  (post TAVR): EF 60-65%.  GRII DD.  Mod LA dilation & nl RA.  Mod MAC.  No  AI (no-PVL), mean AV gradient 13 mmHg.;; b) (2nd post TAVR) EF 60 to 65%.  Severely increased thickness/LVH.  GRII DD with only mildly elevated left atrial pressure.  Normal RA size.  Severe MAC with only trace MR.  No MS.  TAVR valve prosthesis functioning well.  Trivial AI.  Mean gradient 15 mmHg with a peak 29.8 mmHg   TUBAL LIGATION     UPPER GI ENDOSCOPY     normal   WISDOM TOOTH EXTRACTION      OB History   No obstetric history on file.      Home Medications    Prior to Admission medications   Medication Sig Start Date End Date Taking? Authorizing Provider  doxycycline (VIBRAMYCIN) 100 MG capsule Take 1 capsule (100 mg total) by mouth 2 (two) times daily for 7 days. 02/20/24 02/27/24 Yes Billy Asberry FALCON, PA-C  amLODipine (NORVASC) 10 MG tablet TAKE 1 TABLET (10 MG TOTAL) BY MOUTH  DAILY. 07/17/23   Anner Alm ORN, MD  Ascorbic Acid (VITAMIN C) 1000 MG tablet Take 1,000 mg by mouth daily with breakfast.    [provider]  aspirin EC 81 MG tablet Take 1 tablet (81 mg total) by mouth 2 (two) times daily. To prevent blood clots for 30 days after surgery. 05/23/22   Gawne, Meghan M, PA-C  Black Pepper-Turmeric (TURMERIC COMPLEX/BLACK PEPPER PO) Take 15 mLs by mouth daily with breakfast.    [provider]  cetirizine (ZYRTEC) 10 MG tablet Take 10 mg by mouth daily.    [provider]  cholecalciferol (VITAMIN D) 1000 UNITS tablet Take 1,000 Units by mouth daily with breakfast.     [provider]  fluticasone (FLONASE) 50 MCG/ACT nasal spray Place 2 sprays into both nostrils at bedtime.     [provider]  furosemide (LASIX) 20 MG tablet MAY TAKE 1 TABLET UP TO THREE TIMES A WEEK. DO NOT TAKE HCTZ THAT DAY. 09/27/23   Anner Alm ORN, MD  hydrochlorothiazide (HYDRODIURIL) 25 MG tablet Take 1 tablet (25 mg total) by mouth See admin instructions. 12/24/23   Tower, Laine LABOR, MD  lubiprostone (AMITIZA) 24 MCG capsule TAKE 1 CAPSULE (24 MCG TOTAL) BY MOUTH 2 (TWO) TIMES DAILY. LUNCH & SUPPER 02/06/23   Tower, Laine LABOR, MD  meclizine (ANTIVERT) 25 MG tablet Take 0.5 tablets (12.5 mg total) by mouth 2 (two) times daily as needed for dizziness. 12/17/23   Tower, Laine LABOR, MD  meloxicam (MOBIC) 7.5 MG tablet TAKE 1 TABLET (7.5 MG TOTAL) BY MOUTH AT BEDTIME AS NEEDED FOR PAIN. TAKE WITH FOOD. 10/15/23   Tower, Laine LABOR, MD  metoprolol succinate (TOPROL-XL) 50 MG 24 hr tablet Take 1 tablet (50 mg total) by mouth daily after supper. Take with or immediately following a meal. 07/22/23 12/16/24  Anner Alm ORN, MD  Multiple Vitamin (MULTI-VITAMIN) tablet Take 1 tablet by mouth daily. 04/04/16   [provider]  NEXIUM 40 MG capsule Take 40 mg by mouth in the morning. 10/26/19   [provider]  Polyethyl Glycol-Propyl Glycol 0.4-0.3 % SOLN  Place 1-2 drops  into both eyes at bedtime.    [provider]  potassium chloride (KLOR-CON) 10 MEQ tablet TAKE 2 TABLETS BY MOUTH 2 TIMES DAILY. 10/24/23   Tower, Laine LABOR, MD  Simethicone 180 MG CAPS Take 1 capsule by mouth as needed. 08/18/20   [provider]  triamcinolone cream (KENALOG) 0.1 % Apply topically 2 (two) times daily. To affected area/ rash Patient taking differently: Apply 1 Application topically as needed. To affected area/ rash 04/23/22   Tower, Laine LABOR, MD  venlafaxine XR (EFFEXOR-XR) 75 MG 24 hr capsule TAKE 1 CAPSULE (75 MG TOTAL) BY MOUTH DAILY AFTER BREAKFAST. 01/14/24   Tower, Laine LABOR, MD    Family History Family History  Problem Relation Age of Onset   Kidney disease Mother    Alzheimer's disease Mother    Stroke Father 32   Colon cancer Father 77       possibly colon cancer, possibly just polyps   Kidney disease Sister    Heart attack Sister    Colon cancer Paternal Aunt 70   Cancer Paternal Uncle        unk type   Esophageal cancer Neg Hx    Stomach cancer Neg Hx    Rectal cancer Neg Hx    Breast cancer Neg Hx    Ovarian cancer Neg Hx    Endometrial cancer Neg Hx    Pancreatic cancer Neg Hx    Prostate cancer Neg Hx    Colon polyps Neg Hx     Social History Social History   Tobacco Use   Smoking status: Never    Passive exposure: Past   Smokeless tobacco: Never  Vaping Use   Vaping status: Never Used  Substance Use Topics   Alcohol use: No    Alcohol/week: 0.0 standard drinks of alcohol   Drug use: No     Allergies   Clarithromycin, Wound dressing adhesive, Penicillins, Other, and Codeine   Review of Systems Review of Systems  Constitutional:  Negative for chills and fever.  Eyes:  Negative for discharge and redness.  Respiratory:  Negative for shortness of breath.   Gastrointestinal:  Negative for abdominal pain, nausea and vomiting.  Skin:  Positive for color change and rash.     Physical Exam Triage Vital  Signs ED Triage Vitals  Encounter Vitals Group     BP 02/20/24 1211 (!) 143/73     Girls Systolic BP Percentile --      Girls Diastolic BP Percentile --      Boys Systolic BP Percentile --      Boys Diastolic BP Percentile --      Pulse Rate 02/20/24 1211 60     Resp 02/20/24 1211 18     Temp 02/20/24 1211 98.5 F (36.9 C)     Temp Source 02/20/24 1211 Oral     SpO2 02/20/24 1211 98 %     Weight 02/20/24 1211 167 lb 15.9 oz (76.2 kg)     Height --      Head Circumference --      Peak Flow --      Pain Score 02/20/24 1209 0     Pain Loc --      Pain Education --      Exclude from Growth Chart --    No data found.  Updated Vital Signs BP (!) 143/73 (BP Location: Left Arm)   Pulse 60   Temp 98.5 F (36.9 C) (Oral)   Resp 18  Wt 167 lb 15.9 oz (76.2 kg)   SpO2 98%   BMI 28.17 kg/m   Visual Acuity Right Eye Distance:   Left Eye Distance:   Bilateral Distance:    Right Eye Near:   Left Eye Near:    Bilateral Near:     Physical Exam Vitals and nursing note reviewed.  Constitutional:      General: She is not in acute distress.    Appearance: Normal appearance. She is not ill-appearing.  HENT:     Head: Normocephalic and atraumatic.  Eyes:     Conjunctiva/sclera: Conjunctivae normal.  Cardiovascular:     Rate and Rhythm: Normal rate.  Pulmonary:     Effort: Pulmonary effort is normal. No respiratory distress.  Skin:    Comments: No significant erythema surrounding sutured laceration and no active drainage appreciated.  More diffuse erythema and swelling appreciated to right lower leg both anteriorly and posteriorly  Neurological:     Mental Status: She is alert.  Psychiatric:        Mood and Affect: Mood normal.        Behavior: Behavior normal.        Thought Content: Thought content normal.      UC Treatments / Results  Labs (all labs ordered are listed, but only abnormal results are displayed) Labs Reviewed - No data to  display  EKG   Radiology No results found.  Procedures Procedures (including critical care time)  Medications Ordered in UC Medications - No data to display  Initial Impression / Assessment and Plan / UC Course  I have reviewed the triage vital signs and the nursing notes.  Pertinent labs & imaging results that were available during my care of the patient were reviewed by me and considered in my medical decision making (see chart for details).    I suspect most likely cellulitis given appearance of leg.  Will treat with doxycycline and advised follow-up if no gradual improvement with any worsening.  Final Clinical Impressions(s) / UC Diagnoses   Final diagnoses:  Cellulitis of right lower leg   Discharge Instructions   None    ED Prescriptions     Medication Sig Dispense Auth. Provider   doxycycline (VIBRAMYCIN) 100 MG capsule Take 1 capsule (100 mg total) by mouth 2 (two) times daily for 7 days. 14 capsule Billy Asberry FALCON, PA-C      PDMP not reviewed this encounter.   Billy Asberry FALCON, PA-C 02/20/24 1755

## 2024-02-20 NOTE — ED Triage Notes (Signed)
 Note made in error

## 2024-02-20 NOTE — ED Triage Notes (Signed)
 Pt presents requesting wound check on right lower leg. Pt reports she had surgery last Monday for Cancer. The area area the incision now has a rash on it that pt says is extremely itchy. The rash has spread a little to left leg as well.

## 2024-02-25 DIAGNOSIS — L2389 Allergic contact dermatitis due to other agents: Secondary | ICD-10-CM | POA: Diagnosis not present

## 2024-02-25 DIAGNOSIS — L03115 Cellulitis of right lower limb: Secondary | ICD-10-CM | POA: Diagnosis not present

## 2024-03-03 ENCOUNTER — Other Ambulatory Visit: Payer: Self-pay | Admitting: Family Medicine

## 2024-03-13 ENCOUNTER — Ambulatory Visit

## 2024-03-13 ENCOUNTER — Other Ambulatory Visit: Payer: Self-pay | Admitting: Cardiology

## 2024-03-13 MED ORDER — METOPROLOL SUCCINATE ER 50 MG PO TB24: 50.0000 mg | ORAL_TABLET | Freq: Every day | ORAL | 2 refills | Status: DC

## 2024-03-13 NOTE — Progress Notes (Unsigned)
 Subjective:   Veronica Greene is a 78 y.o. who presents for a Medicare Wellness preventive visit.  As a reminder, Annual Wellness Visits don't include a physical exam, and some assessments may be limited, especially if this visit is performed virtually. We may recommend an in-person follow-up visit with your provider if needed.  Visit Complete: {VISITMETHODVS:[phone]}  {AWVVIDEO:32072}  Persons Participating in Visit: {Persons Participating in Visit:32444}  AWV Questionnaire: {AWVQuestionnaire:32338}        Objective:    There were no vitals filed for this visit. There is no height or weight on file to calculate BMI.     12/18/2022   11:06 AM 08/09/2022    3:02 PM 05/22/2022    8:52 PM 05/16/2022    9:36 AM 01/18/2022    9:08 AM 12/15/2021   10:29 AM 07/20/2021    1:53 PM  Advanced Directives  Does Patient Have a Medical Advance Directive? Yes Yes Yes Yes Yes No Yes  Type of Estate agent of [place];Living will  Living will;Healthcare Power of Attorney Living will;Healthcare Power of [employer] Power of [place];Living will    Does patient want to make changes to medical advance directive?  No - Patient declined No - Patient declined  No - Patient declined  No - Patient declined  Copy of Healthcare Power of Attorney in Chart? No - copy requested  Yes - validated most recent copy scanned in chart (See row information)      Would patient like information on creating a medical advance directive?      No - Patient declined     Current Medications (verified) Outpatient Encounter Medications as of 03/13/2024  Medication Sig   amLODipine  (NORVASC ) 10 MG tablet TAKE 1 TABLET (10 MG TOTAL) BY MOUTH DAILY.   Ascorbic Acid (VITAMIN C) 1000 MG tablet Take 1,000 mg by mouth daily with breakfast.   aspirin  EC 81 MG tablet Take 1 tablet (81 mg total) by mouth 2 (two) times daily. To prevent blood clots for 30 days after surgery.   Black Pepper-Turmeric  (TURMERIC COMPLEX/BLACK PEPPER PO) Take 15 mLs by mouth daily with breakfast.   cetirizine (ZYRTEC) 10 MG tablet Take 10 mg by mouth daily.   cholecalciferol (VITAMIN D ) 1000 UNITS tablet Take 1,000 Units by mouth daily with breakfast.    fluticasone  (FLONASE ) 50 MCG/ACT nasal spray Place 2 sprays into both nostrils at bedtime.    furosemide  (LASIX ) 20 MG tablet MAY TAKE 1 TABLET UP TO THREE TIMES A WEEK. DO NOT TAKE HCTZ THAT DAY.   hydrochlorothiazide  (HYDRODIURIL ) 25 MG tablet Take 1 tablet (25 mg total) by mouth See admin instructions.   lubiprostone  (AMITIZA ) 24 MCG capsule TAKE 1 CAPSULE (24 MCG TOTAL) BY MOUTH 2 (TWO) TIMES DAILY. LUNCH & SUPPER   meclizine  (ANTIVERT ) 25 MG tablet Take 0.5 tablets (12.5 mg total) by mouth 2 (two) times daily as needed for dizziness.   meloxicam  (MOBIC ) 7.5 MG tablet TAKE 1 TABLET (7.5 MG TOTAL) BY MOUTH AT BEDTIME AS NEEDED FOR PAIN. TAKE WITH FOOD.   metoprolol  succinate (TOPROL -XL) 50 MG 24 hr tablet Take 1 tablet (50 mg total) by mouth daily after supper. Take with or immediately following a meal.   Multiple Vitamin (MULTI-VITAMIN) tablet Take 1 tablet by mouth daily.   NEXIUM  40 MG capsule Take 40 mg by mouth in the morning.   Polyethyl Glycol-Propyl Glycol 0.4-0.3 % SOLN Place 1-2 drops into both eyes at bedtime.   potassium chloride  (  KLOR-CON ) 10 MEQ tablet TAKE 2 TABLETS BY MOUTH 2 TIMES DAILY.   Simethicone 180 MG CAPS Take 1 capsule by mouth as needed.   triamcinolone  cream (KENALOG ) 0.1 % Apply topically 2 (two) times daily. To affected area/ rash (Patient taking differently: Apply 1 Application topically as needed. To affected area/ rash)   venlafaxine  XR (EFFEXOR -XR) 75 MG 24 hr capsule TAKE 1 CAPSULE (75 MG TOTAL) BY MOUTH DAILY AFTER BREAKFAST.   No facility-administered encounter medications on file as of 03/13/2024.    Allergies (verified) Clarithromycin, Wound dressing adhesive, Penicillins, Other, and Codeine   History: Past Medical  History:  Diagnosis Date   Anxiety and depression 08/26/2007   Qualifier: Diagnosis of  By: Randeen MD, Laine Caldron    Arthritis    knees   Constipation, slow transit 11/03/2010   Degenerative disk disease 08/31/2011   Depression with anxiety    DERMATITIS, SEBORRHEIC 08/25/2010   Qualifier: Diagnosis of  By: Randeen MD, Laine Caldron    Endometrial cancer [facility])    02/ 2019  Diagnosed with D&C/hysteroscopy with polypectomy   Essential hypertension 08/29/2017   Fibromyalgia    tx mobic    Full dentures    GERD 08/26/2007   Qualifier: Diagnosis of  By: Randeen MD, Laine Caldron    Gout    Hearing loss    wears bilateral hearing aids   History of colon polyps    History of nonmelanoma skin cancer    right lower leg   History of poliomyelitis 08/26/2007   Qualifier: History of  By: Randeen MD, Laine Caldron    History of radiation therapy    Endometium- 11/20/17-12/11/17- Vag Cuff -Dr. Lynwood Nasuti   Hyperlipidemia, mild 09/02/2012   Hypertension    Hypokalemia 01/02/2011   IBS (irritable bowel syndrome)    with constipation   Obesity 08/26/2007   Qualifier: Diagnosis of  By: Randeen MD, Laine Caldron    OSTEOARTHRITIS, HANDS, BILATERAL 08/26/2007   Qualifier: Diagnosis of  By: Randeen MD, Laine Caldron    Osteopenia    OVERACTIVE BLADDER 08/26/2007   Qualifier: Diagnosis of  By: Randeen MD, Laine Caldron    Personal history of colonic polyps 11/03/2010   05/2018 13 polyps mostly adenomas, ssp's - recall 1 year 2020 Lupita CHARLENA Commander, MD, [facility]    Poor balance 11/25/2018   With falls  Has rise in R shoe/post polio  Weak legs as well     S/P TAVR (transcatheter aortic valve replacement)    s/p TAVR with a 23mm Edwards S3U via the TF approach by Dr. Lucas and Dr. Verlin   Severe calcific aortic stenosis 08/27/2017    -> Became severe as of October 2020-referred for TAVR on 04/28/2019   URINARY INCONTINENCE 08/26/2007   Qualifier: Diagnosis of  By: Randeen MD, Laine Caldron    Wears hearing aid in both ears    Past  Surgical History:  Procedure Laterality Date   ANKLE SURGERY Right 1956   CARPAL TUNNEL RELEASE Bilateral 1986, 1993   CATARACT EXTRACTION Right 11/16/2019   COLONOSCOPY N/A 06/24/2015   Procedure: COLONOSCOPY;  Surgeon: Lupita FORBES Commander, MD;  Location: [facility];  Service: Endoscopy;  Laterality: N/A;   DILATATION & CURETTAGE/HYSTEROSCOPY WITH MYOSURE N/A 08/09/2017   Procedure: DILATATION & CURETTAGE/HYSTEROSCOPY WITH MYOSURE;  Surgeon: Rosalva Sawyer, MD;  Location: [facility];  Service: Gynecology;  Laterality: N/A;  Polypectomy   DILATION AND CURETTAGE OF UTERUS     PMB   HAND SURGERY Bilateral 1999  Thumbs    KNEE SURGERY Bilateral 1998   x2   LYMPH NODE BIOPSY N/A 10/08/2017   Procedure: LYMPH NODE BIOPSY;  Surgeon: Eloy Herring, MD;  Location: [facility];  Service: Gynecology;  Laterality: N/A;   MULTIPLE TOOTH EXTRACTIONS     NASAL SINUS SURGERY  1976   polio surgery.     right foot - right leg 1.5 shorter than left leg - some wekness   RIGHT/LEFT HEART CATH AND CORONARY ANGIOGRAPHY N/A 04/09/2019   Procedure: RIGHT/LEFT HEART CATH AND CORONARY ANGIOGRAPHY;  Surgeon: Anner Alm ORN, MD;  Location: [facility] INVASIVE CV LAB;  Service: Cardiovascular;; Severe AS, mean gradient 50 mmHg P-P 55 mmHg (estimated AVA 0.76 cm).  Mildly elevated pulmonary pressures with elevated LVEDP and PCWP.  Angiographically normal coronary arteries, but tortuous.   ROBOTIC ASSISTED TOTAL HYSTERECTOMY WITH BILATERAL SALPINGO OOPHERECTOMY Bilateral 10/08/2017   Procedure: XI ROBOTIC ASSISTED TOTAL HYSTERECTOMY WITH BILATERAL SALPINGO OOPHORECTOMY;  Surgeon: Eloy Herring, MD;  Location: [facility];  Service: Gynecology;  Laterality: Bilateral;   TEE WITHOUT CARDIOVERSION N/A 04/28/2019   Procedure: TRANSESOPHAGEAL ECHOCARDIOGRAM (TEE);  Surgeon: Verlin Lonni BIRCH, MD;  Location[facility] CV LAB;  Service: Open Heart Surgery;  Laterality: N/A;   THUMB ARTHROSCOPY  1999   TONSILLECTOMY  244   78 years old   TOTAL  KNEE ARTHROPLASTY Right 05/22/2022   Procedure: TOTAL KNEE ARTHROPLASTY;  Surgeon: Beverley Evalene BIRCH, MD;  Location: [facility];  Service: Orthopedics;  Laterality: Right;   TRANSCATHETER AORTIC VALVE REPLACEMENT, TRANSFEMORAL N/A 04/28/2019   Procedure: TRANSCATHETER AORTIC VALVE REPLACEMENT, TRANSFEMORAL;  Surgeon: Verlin Lonni BIRCH, MD;  Location: [facility] INVASIVE CV LAB; ; 23 mm Sapien 3 Ultra THV via TF approach. (post-op mean AV gradient 13 mmHg)   TRANSTHORACIC ECHOCARDIOGRAM  05/15/2021   Normal/stable LV size and function.  EF 65 to 70%.  No RWMA.  GR 2 DD.  Moderate LA dilation.  Normal RV with mild RA dilation, normal RAP.SABRA  Severe MAC with no MR.  23 mm SAPIEN prosthetic TAVR valve well-seated.  Mean AVG 18 mm..  Trivial AI.  (No significant change from 05/04/2020-mean AVG 14 mmHg).   TRANSTHORACIC ECHOCARDIOGRAM  02/17/2019   Mod LVH. GR 2 DD. Severe AS (mean gradient = 44 mmHg with Mod AI. mild MS.   TRANSTHORACIC ECHOCARDIOGRAM  04/29/2019; 05/2020   a)  (post TAVR): EF 60-65%.  GRII DD.  Mod LA dilation & nl RA.  Mod MAC.  No  AI (no-PVL), mean AV gradient 13 mmHg.;; b) (2nd post TAVR) EF 60 to 65%.  Severely increased thickness/LVH.  GRII DD with only mildly elevated left atrial pressure.  Normal RA size.  Severe MAC with only trace MR.  No MS.  TAVR valve prosthesis functioning well.  Trivial AI.  Mean gradient 15 mmHg with a peak 29.8 mmHg   TUBAL LIGATION     UPPER GI ENDOSCOPY     normal   WISDOM TOOTH EXTRACTION     Family History  Problem Relation Age of Onset   Kidney disease Mother    Alzheimer's disease Mother    Stroke Father 36   Colon cancer Father 61       possibly colon cancer, possibly just polyps   Kidney disease Sister    Heart attack Sister    Colon cancer Paternal Aunt 59   Cancer Paternal Uncle        unk type   Esophageal cancer Neg Hx    Stomach cancer  Neg Hx    Rectal cancer Neg Hx    Breast cancer Neg Hx    Ovarian cancer Neg Hx    Endometrial  cancer Neg Hx    Pancreatic cancer Neg Hx    Prostate cancer Neg Hx    Colon polyps Neg Hx    Social History   Socioeconomic History   Marital status: Widowed    Spouse name: Not on file   Number of children: 2   Years of education: Not on file   Highest education level: High school graduate  Occupational History   Occupation: RECEPTION    Employer: [employer]    Comment: Retired; Theme park manager reception  Tobacco Use   Smoking status: Never    Passive exposure: Past   Smokeless tobacco: Never  Vaping Use   Vaping status: Never Used  Substance and Sexual Activity   Alcohol  use: No    Alcohol /week: 0.0 standard drinks of alcohol    Drug use: No   Sexual activity: Yes    Birth control/protection: Post-menopausal  Other Topics Concern   Not on file  Social History Narrative   She is relatively recently remarried.  She has 2  children and 3 grandchildren.     She is currently retired.  But enjoys cleaning house and doing chores.  She likes to do yard work.  Does not routinely exercise.   Social Drivers of Corporate investment banker Strain: Low Risk  (12/18/2022)   Overall Financial Resource Strain (CARDIA)    Difficulty of Paying Living Expenses: Not hard at all  Food Insecurity: No Food Insecurity (12/18/2022)   Hunger Vital Sign    Worried About Running Out of Food in the Last Year: Never true    Ran Out of Food in the Last Year: Never true  Transportation Needs: No Transportation Needs (12/18/2022)   PRAPARE - Administrator, Civil Service (Medical): No    Lack of Transportation (Non-Medical): No  Physical Activity: Inactive (12/18/2022)   Exercise Vital Sign    Days of Exercise per Week: 0 days    Minutes of Exercise per Session: 0 min  Stress: No Stress Concern Present (12/18/2022)   [employer] of Occupational Health - Occupational Stress Questionnaire    Feeling of Stress : Not at all  Social Connections: Moderately Integrated (12/18/2022)   Social  Connection and Isolation Panel    Frequency of Communication with Friends and Family: More than three times a week    Frequency of Social Gatherings with Friends and Family: More than three times a week    Attends Religious Services: 1 to 4 times per year    Active Member of [employer] or Organizations: No    Attends Banker Meetings: Never    Marital Status: Married    Tobacco Counseling Counseling given: Not Answered    Clinical Intake:              Lab Results  Component Value Date   HGBA1C 5.0 11/27/2022   HGBA1C 5.1 04/24/2019   HGBA1C 5.1 11/20/2018               Activities of Daily Living ***     No data to display          Patient Care Team: Tower, Laine LABOR, MD as PCP - General Anner Alm ORN, MD as PCP - Cardiology (Cardiology) Regal, [place] RAMAN, DPM as Consulting Physician (Podiatry) Beverley Toribio FALCON, MD (Inactive) as Consulting  Physician (Orthopedic Surgery) Mable Lenis, MD (Inactive) as Consulting Physician (Otolaryngology) Glendia Brew, DDS as Consulting Physician (Dentistry) Glendia Simmonds, OD as Referring Physician (Optometry) *** I have updated your Care Teams any recent Medical Services you may have received from other providers in the past year.     Assessment:   This is a routine wellness examination for Veronica Greene.  Hearing/Vision screen No results found.   Goals Addressed   None    Depression Screen ***    12/17/2023    8:11 PM 12/18/2022   11:05 AM 12/11/2022   10:07 AM 12/15/2021   10:26 AM 12/14/2020    2:19 PM 11/29/2020    9:40 AM 11/24/2019    9:07 AM  PHQ 2/9 Scores  PHQ - 2 Score 0 0 1 0 4 1 1   PHQ- 9 Score    0 4 3 1     Fall Risk ***    12/17/2023   10:01 AM 10/28/2023    2:02 PM 12/18/2022   11:06 AM 12/11/2022   10:06 AM 12/15/2021   10:30 AM  Fall Risk   Falls in the past year? 1 0 1 1 1   Number falls in past yr: 0 0 0 0 0  Injury with Fall? 0 0 0 0 1  Comment   Brused hip    Risk for fall due to :  History of fall(s) No Fall Risks Impaired balance/gait Impaired mobility History of fall(s)  Follow up Falls evaluation completed Falls evaluation completed Falls prevention discussed;Falls evaluation completed Falls evaluation completed Falls evaluation completed;Falls prevention discussed      Data saved with a previous flowsheet row definition    MEDICARE RISK AT HOME: ***    TIMED UP AND GO:  Was the test performed?  {[phone]}  Cognitive Function: {CognitiveScreening:32337}    12/14/2020    2:21 PM 11/24/2019    9:14 AM 11/20/2018    8:41 AM 11/07/2017    8:28 AM 11/02/2016   10:15 AM  MMSE - Mini Mental State Exam  Orientation to time 5 5 5 5 5    Orientation to Place 5 5 5 5 5    Registration 3 3 3 3 3    Attention/ Calculation 5 5 0 0 0   Recall 3 3 2 3 3    Recall-comments   unable to recall 1 of 3 words    Language- name 2 objects   0 0 0   Language- repeat 1 1 1 1 1   Language- follow 3 step command   0 3 3   Language- read & follow direction   0 0 0   Write a sentence   0 0 0   Copy design   0 0 0   Total score   16 20 20       Data saved with a previous flowsheet row definition        12/18/2022   11:08 AM 12/15/2021   10:33 AM  6CIT Screen  What Year? 0 points 0 points  What month? 0 points 0 points  What time? 0 points 0 points  Count back from 20 0 points 0 points  Months in reverse 0 points 0 points  Repeat phrase 0 points 0 points  Total Score 0 points 0 points    Immunizations Immunization History  Administered Date(s) Administered   Fluad Quad(high Dose 65+) 05/06/2019, 05/04/2020, 03/30/2022   INFLUENZA, HIGH DOSE SEASONAL PF 05/17/2015, 05/07/2016, 05/07/2017, 04/22/2018, 05/06/2019, 05/04/2020, 05/04/2021, 05/07/2023   Influenza Split  04/08/2008, 06/30/2009   Influenza Whole 04/08/2008, 06/30/2009   Influenza-Unspecified 05/09/2014, 05/17/2015, 05/07/2016, 05/07/2017, 04/22/2018, 05/04/2021   PFIZER(Purple Top)SARS-COV-2 Vaccination  07/30/2019, 08/20/2019, 04/23/2020   Pneumococcal Conjugate-13 10/12/2014   Pneumococcal Polysaccharide-23 08/25/2010   Pneumococcal-Unspecified 08/25/2010   Respiratory Syncytial Virus Vaccine,Recomb Aduvanted(Arexvy) 05/17/2023   Td 10/15/2003   Td,absorbed, Preservative Free, Adult Use, Lf Unspecified 10/15/2003   Tdap 06/18/2018, 12/26/2022   Zoster, Live 08/12/2008    Screening Tests Health Maintenance  Topic Date Due   Zoster Vaccines- Shingrix (1 of 2) 02/23/1965   Medicare Annual Wellness (AWV)  12/18/2023   Influenza Vaccine  02/07/2024   COVID-19 Vaccine (4 - 2025-26 season) 03/09/2024   MAMMOGRAM  07/16/2024   DTaP/Tdap/Td (5 - Td or Tdap) 12/25/2032   Pneumococcal Vaccine: 50+ Years  Completed   DEXA SCAN  Completed   Hepatitis C Screening  Completed   HPV VACCINES  Aged Out   Meningococcal B Vaccine  Aged Out   Colonoscopy  Discontinued    Health Maintenance Items Addressed: {HMMCR (Optional):30011}  Additional Screening:  Vision Screening: Recommended annual ophthalmology exams for early detection of glaucoma and other disorders of the eye. Is the patient up to date with their annual eye exam?  {YES/NO:21197} Who is the provider or what is the name of the office in which the patient attends annual eye exams? ***  Dental Screening: Recommended annual dental exams for proper oral hygiene  Community Resource Referral / Chronic Care Management: CRR required this visit?  {YES/NO:21197}  CCM required this visit?  {CCM Required choices:[phone]}   Plan:    I have personally reviewed and noted the following in the patient's chart:   Medical and social history Use of alcohol , tobacco or illicit drugs  Current medications and supplements including opioid prescriptions. {Opioid Prescriptions:[phone]} Functional ability and status Nutritional status Physical activity Advanced directives List of other physicians Hospitalizations, surgeries, and ER  visits in previous 12 months Vitals Screenings to include cognitive, depression, and falls Referrals and appointments  In addition, I have reviewed and discussed with patient certain preventive protocols, quality metrics, and best practice recommendations. A written personalized care plan for preventive services as well as general preventive health recommendations were provided to patient.   Erminio LITTIE Saris, LPN   0/10/7972   After Visit Summary: {CHL AMB AWV After Visit Summary:[phone]}  Notes: {Nurse Notes:32343}

## 2024-04-01 DIAGNOSIS — M25562 Pain in left knee: Secondary | ICD-10-CM | POA: Diagnosis not present

## 2024-04-29 DIAGNOSIS — C44729 Squamous cell carcinoma of skin of left lower limb, including hip: Secondary | ICD-10-CM | POA: Diagnosis not present

## 2024-04-29 DIAGNOSIS — D485 Neoplasm of uncertain behavior of skin: Secondary | ICD-10-CM | POA: Diagnosis not present

## 2024-04-29 DIAGNOSIS — C44722 Squamous cell carcinoma of skin of right lower limb, including hip: Secondary | ICD-10-CM | POA: Diagnosis not present

## 2024-04-29 DIAGNOSIS — L570 Actinic keratosis: Secondary | ICD-10-CM | POA: Diagnosis not present

## 2024-05-20 ENCOUNTER — Ambulatory Visit (HOSPITAL_COMMUNITY)

## 2024-05-20 DIAGNOSIS — C44729 Squamous cell carcinoma of skin of left lower limb, including hip: Secondary | ICD-10-CM | POA: Diagnosis not present

## 2024-05-20 DIAGNOSIS — C44722 Squamous cell carcinoma of skin of right lower limb, including hip: Secondary | ICD-10-CM | POA: Diagnosis not present

## 2024-05-20 NOTE — Progress Notes (Signed)
 Cardiology Office Note    Date:  05/21/2024  ID:  Tylena, Prisk [birth year], MRN [id number] PCP:  Randeen Laine LABOR, MD  Cardiologist:  Alm Clay, MD  Electrophysiologist:  None   Chief Complaint: Follow up for aortic stenosis and mitral stenosis   History of Present Illness: .   Veronica Greene is a 78 y.o. female with visit-pertinent history of severe AS s/p TAVR (04/2019), hypertension, GERD, fibromyalgia, endometrial cancer, postpolio syndrome, depression and anxiety.  Patient with history of postpolio syndrome with right leg weakness and decreased length.  She has a history of issues with balance and recurrent falls.  She has a history of severe aortic stenosis, initially diagnosed in 2013 when she was noted to have a heart murmur.  She reported progressive fatigue.  Cardiac catheterization in October 2020 showed no obstructive CAD.  She underwent successful TAVR with a 23 mm Edwards SAPIEN 3 ultra THV via TF approach in October 2020.  Postop echo showed EF 60 to 65%, normally functioning TAVR with a mean gradient of 13 mmHg and no PVL.  Echo in November 2022 showed EF 65 to 70%, normal LV function, G2 DD, stable prosthetic TAVR, mean gradient 18 mmHg.  Last echocardiogram on 06/04/2023 indicated LVEF 7075%, no RWMA, moderate concentric LVH, diastolic function could not be evaluated, RV systolic function size is normal, mildly elevated pulmonary artery systolic pressures, LA was severely dilated, mild mitral valve regurgitation with moderate to severe mitral stenosis, aortic valve regurgitation was not visualized, 23 mm SAPIEN prosthetic valve present in the aortic position  Patient was last seen in clinic on 11/11/2023 by Dr. Clay.  Patient had previously reported episodes of heart racing lasting 10 to 15 minutes, she wore a heart monitor however no episodes were captured.  Her metoprolol  had been increased to 50 mg which improved frequency intensity the palpitations, she  noted at follow-up that they still occurred however were not as forceful.  She did report some dizziness which occurred primarily when standing up or getting up but not distally with fast heartbeats.   Today she presents for follow-up.  She reports that she has been doing well, she notes some palpitations at night when she first lays down that are brief, lasting only a few seconds.  She denies any palpitations or feeling of irregular heartbeats during the day.  She notes that she has been intermittently taking her metoprolol , reports she only takes it a few nights a weeks as she feels as though it makes it difficult for her to sleep.  She denies any chest pain, shortness of breath, increased lower extremity edema, orthopnea or PND.  She denies any presyncope or syncope.  She notes occasional dizziness, notes history of vertigo, dizziness is improved with meclizine .  She reports that her lower extremity edema has been very well-controlled, not present today on exam, she notes that she did have some areas on her shins concerning for skin cancer, results were negative. ROS: .   Today she denies chest pain, shortness of breath, lower extremity edema, fatigue, palpitations, melena, hematuria, hemoptysis, diaphoresis, weakness, presyncope, syncope, orthopnea, and PND.  All other systems are reviewed and otherwise negative. Studies Reviewed: SABRA   EKG:  EKG is ordered today, personally reviewed, demonstrating  EKG Interpretation Date/Time:  Thursday May 21 2024 11:00:18 EST Ventricular Rate:  58 PR Interval:  202 QRS Duration:  86 QT Interval:  446 QTC Calculation: 437 R Axis:   27  Text Interpretation: Sinus  bradycardia Low voltage QRS Septal infarct (cited on or before 30-Apr-2023) When compared with ECG of 30-Apr-2023 09:38, No significant change was found Confirmed by Horacio Werth [phone]) on 05/21/2024 11:07:35 AM   CV Studies: Cardiac studies reviewed are outlined and summarized above. Otherwise  please see EMR for full report. Cardiac Studies & Procedures   ______________________________________________________________________________________________ CARDIAC CATHETERIZATION  CARDIAC CATHETERIZATION 04/09/2019  Conclusion  There is severe aortic valve stenosis.  Hemodynamic findings consistent with mild pulmonary hypertension. LV end diastolic pressure is moderately elevated.  Angiographically normal coronary arteries, very tortuous  SUMMARY  Severe calcific aortic stenosis  -mean aortic gradient 50 mmHg with peak to peak 55 mmHg.  (Estimated valve area 0.76 cm)  Mild pulmonary hypertension secondary to elevated LVEDP and PCWP  Angiographically normal coronary arteries  RECOMMENDATIONS  Standard post cath care and discharge home after bed rest  She will be met by the structural heart/valve clinic team coordinator Hershall Hummer, [place]) prior to discharge today.  Will be scheduled in the valve clinic.  Follow-up with me as scheduled.  No change in medications.   Alm Clay, MD  Findings Coronary Findings Diagnostic  Dominance: Right  Left Main Vessel was injected. Vessel is large. Vessel is angiographically normal.  Left Anterior Descending Vessel was injected. Vessel is large. Vessel is angiographically normal. The vessel is tortuous. The vessel tapers down to a small caliber vessel after 3rd Diag, and barely reaches the apex  First Diagonal Branch Vessel was injected. Vessel is small in size. Vessel is angiographically normal.  Second Diagonal Branch Vessel was injected. Vessel is normal in size. Vessel is moderate to large in size tapering to small caliber vessel Vessel is angiographically normal.  Third Diagonal Branch Vessel was injected. Vessel is small in size. Vessel is angiographically normal.  Left Circumflex Vessel was injected. Vessel is moderate in size. Vessel is angiographically normal.  First Obtuse Marginal Branch Vessel was  injected. Vessel is small in size. Vessel is angiographically normal.  Left Posterior Atrioventricular Artery Vessel was injected. Vessel is small in size. Vessel is angiographically normal.  Right Coronary Artery Vessel was injected. Vessel is large. Vessel is angiographically normal.  Acute Marginal Branch Vessel was injected. Vessel is small in size. Vessel is angiographically normal.  Right Ventricular Branch Vessel was injected. Vessel is small in size. Vessel is angiographically normal.  Right Posterior Descending Artery Vessel was injected. Vessel is large in size. Vessel is angiographically normal. The vessel is ectatic.  Right Posterior Atrioventricular Artery Vessel was injected. Vessel is large in size. Vessel is angiographically normal. The vessel is tortuous.  First Right Posterolateral Branch Vessel was injected. Vessel is small in size. Vessel is angiographically normal.  Third Right Posterolateral Branch Vessel was injected. Vessel is small in size. Vessel is angiographically normal.  Intervention  No interventions have been documented.     ECHOCARDIOGRAM  ECHOCARDIOGRAM COMPLETE 06/04/2023  Narrative ECHOCARDIOGRAM REPORT    Patient Name:   Veronica Greene [facility] Date of Exam: 06/04/2023 Medical Rec #:  [id number]            Height:       66.0 in Accession #:    [id number]           Weight:       169.8 lb Date of Birth:  1946-04-10            BSA:          1.865 m Patient Age:    78 years  BP:           132/54 mmHg Patient Gender: F                    HR:           83 bpm. Exam Location:  [address]  Procedure: 2D Echo, Cardiac Doppler and Color Doppler  Indications:    Z95.2 S/p TAVR  History:        Patient has prior history of Echocardiogram examinations, most recent 06/28/2022. S/p TAVR (23mm Celestia BUE), Signs/Symptoms:Palpitations; Risk Factors:Hypertension and Dyslipidemia. Aortic Valve: 23 mm Sapien prosthetic, stented  (TAVR) valve is present in the aortic position. Procedure Date: 04/28/2019.  Sonographer:    Elsie Celestia RDCS Referring Phys: 64 DAVID W HARDING  IMPRESSIONS   1. Left ventricular ejection fraction, by estimation, is 70 to 75%. The left ventricle has hyperdynamic function. The left ventricle has no regional wall motion abnormalities. There is moderate concentric left ventricular hypertrophy. Left ventricular diastolic function could not be evaluated. 2. Right ventricular systolic function is normal. The right ventricular size is normal. There is mildly elevated pulmonary artery systolic pressure. The estimated right ventricular systolic pressure is 39.5 mmHg. 3. Left atrial size was severely dilated. 4. The mitral valve is degenerative. Mild mitral valve regurgitation. Moderate to severe mitral stenosis. The mean mitral valve gradient is 9.6 mmHg with average heart rate of 80 bpm. Moderate to severe mitral annular calcification. 5. The aortic valve has been repaired/replaced. Aortic valve regurgitation is not visualized. There is a 23 mm Sapien prosthetic (TAVR) valve present in the aortic position. Procedure Date: 04/28/2019. Echo findings are consistent with normal structure and function of the aortic valve prosthesis. Aortic valve area, by VTI measures 1.12 cm. Aortic valve mean gradient measures 16.5 mmHg. Aortic valve Vmax measures 2.80 m/s. 6. The inferior vena cava is normal in size with greater than 50% respiratory variability, suggesting right atrial pressure of 3 mmHg.  Comparison(s): Changes from prior study are noted. Moderate to severe mitral stenosis is present. AoV prosthesis is stable.  FINDINGS Left Ventricle: Left ventricular ejection fraction, by estimation, is 70 to 75%. The left ventricle has hyperdynamic function. The left ventricle has no regional wall motion abnormalities. The left ventricular internal cavity size was normal in size. There is moderate concentric  left ventricular hypertrophy. Left ventricular diastolic function could not be evaluated due to mitral annular calcification (moderate or greater). Left ventricular diastolic function could not be evaluated.  Right Ventricle: The right ventricular size is normal. No increase in right ventricular wall thickness. Right ventricular systolic function is normal. There is mildly elevated pulmonary artery systolic pressure. The tricuspid regurgitant velocity is 3.02 m/s, and with an assumed right atrial pressure of 3 mmHg, the estimated right ventricular systolic pressure is 39.5 mmHg.  Left Atrium: Left atrial size was severely dilated.  Right Atrium: Right atrial size was normal in size.  Pericardium: There is no evidence of pericardial effusion.  Mitral Valve: The mitral valve is degenerative in appearance. Moderate to severe mitral annular calcification. Mild mitral valve regurgitation. Moderate to severe mitral valve stenosis. MV peak gradient, 19.7 mmHg. The mean mitral valve gradient is 9.6 mmHg with average heart rate of 80 bpm.  Tricuspid Valve: The tricuspid valve is grossly normal. Tricuspid valve regurgitation is mild . No evidence of tricuspid stenosis.  Aortic Valve: The aortic valve has been repaired/replaced. Aortic valve regurgitation is not visualized. Aortic valve mean gradient measures 16.5 mmHg. Aortic  valve peak gradient measures 31.2 mmHg. Aortic valve area, by VTI measures 1.12 cm. There is a 23 mm Sapien prosthetic, stented (TAVR) valve present in the aortic position. Procedure Date: 04/28/2019. Echo findings are consistent with normal structure and function of the aortic valve prosthesis.  Pulmonic Valve: The pulmonic valve was grossly normal. Pulmonic valve regurgitation is mild. No evidence of pulmonic stenosis.  Aorta: The aortic root and ascending aorta are structurally normal, with no evidence of dilitation.  Venous: The inferior vena cava is normal in size with  greater than 50% respiratory variability, suggesting right atrial pressure of 3 mmHg.  IAS/Shunts: The atrial septum is grossly normal.   LEFT VENTRICLE PLAX 2D LVIDd:         3.90 cm   Diastology LVIDs:         2.40 cm   LV e' medial:    5.22 cm/s LV PW:         1.40 cm   LV E/e' medial:  36.4 LV IVS:        1.50 cm   LV e' lateral:   7.83 cm/s LVOT diam:     1.70 cm   LV E/e' lateral: 24.3 LV SV:         63 LV SV Index:   34 LVOT Area:     2.27 cm   RIGHT VENTRICLE             IVC RV S prime:     20.10 cm/s  IVC diam: 1.00 cm TAPSE (M-mode): 2.2 cm RVSP:           39.5 mmHg  LEFT ATRIUM             Index        RIGHT ATRIUM           Index LA diam:        4.40 cm 2.36 cm/m   RA Pressure: 3.00 mmHg LA Vol (A2C):   93.2 ml 49.96 ml/m  RA Area:     14.10 cm LA Vol (A4C):   89.0 ml 47.71 ml/m  RA Volume:   35.00 ml  18.76 ml/m LA Biplane Vol: 91.7 ml 49.16 ml/m AORTIC VALVE AV Area (Vmax):    1.03 cm AV Area (Vmean):   1.14 cm AV Area (VTI):     1.12 cm AV Vmax:           279.50 cm/s AV Vmean:          187.000 cm/s AV VTI:            0.568 m AV Peak Grad:      31.2 mmHg AV Mean Grad:      16.5 mmHg LVOT Vmax:         127.00 cm/s LVOT Vmean:        94.300 cm/s LVOT VTI:          0.279 m LVOT/AV VTI ratio: 0.49  AORTA Ao Root diam: 3.10 cm Ao Asc diam:  3.20 cm  MITRAL VALVE                TRICUSPID VALVE MV Area (PHT): 2.01 cm     TR Peak grad:   36.5 mmHg MV Area VTI:   0.91 cm     TR Vmax:        302.00 cm/s MV Peak grad:  19.7 mmHg    Estimated RAP:  3.00 mmHg MV Mean grad:  9.6 mmHg  RVSP:           39.5 mmHg MV Vmax:       2.22 m/s MV Vmean:      152.0 cm/s   SHUNTS MV VTI:        0.69 m       Systemic VTI:  0.28 m MV Decel Time: 378 msec     Systemic Diam: 1.70 cm MV E velocity: 190.00 cm/s MV A velocity: 142.00 cm/s MV E/A ratio:  1.34  Darryle Decent MD Electronically signed by Darryle Decent MD Signature Date/Time: 06/04/2023/2:14:11  PM    Final    MONITORS  LONG TERM MONITOR (3-14 DAYS) 05/16/2023  Narrative   ~7-day Zio patch monitor (10/25-11/07/2022)   Predominant underlying rhythm is Sinus Rhythm with First AV Block   39 Atrial Runs: Fastest was 7 beats (3.1 seconds) with a rate range of 144 to 160 bpm and average of 151 bpm.  Longest was 21 beats (14.4 seconds) with a rate range of 74 to 97 bpm and an average of 88 bpm.   Isolated premature atrial contractions (PACs) were rare (<1.0%), PAC couplets and triplets were also rare Isolated premature ventricular contractions (PVCs) were rare (<1.0%), PVC couplets were rare (<1.0%),; Ventricular Bigeminy was present.   No Sustained Arrhythmias: Atrial Tachycardia (AT), Supraventricular Tachycardia (SVT), Atrial Fibrillation (A-Fib), Atrial Flutter (A-Flutter), Sustained Ventricular Tachycardia (VT)   No prolonged or symptomatic bradycardia, prolonged pauses or high-grade AV block noted.   33 triggers with 10 diary entries for PACs, PVCs and sinus rhythm, but not for atrial runs.   Overall relatively benign study.  No gross abnormalities noted.  Would probably not change management.  Simply reassure.   Alm Clay, MD   CT SCANS  CT CORONARY MORPH W/CTA COR W/SCORE 04/17/2019  Addendum 04/17/2019  6:50 PM ADDENDUM REPORT: 04/17/2019 18:48  CLINICAL DATA:  78 year old female with severe aortic stenosis being evaluated for a TAVR procedure.  EXAM: Cardiac TAVR CT  TECHNIQUE: The patient was scanned on a [employer]. A 120 kV retrospective scan was triggered in the descending thoracic aorta at 111 HU's. Gantry rotation speed was 250 msecs and collimation was .6 mm. No beta blockade or nitro were given. The 3D data set was reconstructed in 5% intervals of the R-R cycle. Systolic and diastolic phases were analyzed on a dedicated work station using MPR, MIP and VRT modes. The patient received 80 cc of contrast.  FINDINGS: Aortic Valve:  Trileaflet aortic valve with severely calcified and thickened leaflets and mild asymmetric calcifications extending into LVOT under the non-coronary cusp.  Aorta: Normal size with mild diffuse calcifications and no dissection.  Sinotubular Junction: 26 x 24 mm  Ascending Thoracic Aorta: 29 x 29 mm  Aortic Arch: 24 x 22 mm  Descending Thoracic Aorta: 21 x 19 mm  Sinus of Valsalva Measurements:  Non-coronary: 30 mm  Right -coronary: 29 mm  Left -coronary: 30 mm  Coronary Artery Height above Annulus:  Left Main: 15 mm  Right Coronary: 14 mm  Virtual Basal Annulus Measurements:  Maximum/Minimum Diameter: 27.1 x 19.1 mm  Mean Diameter: 22.5 mm  Perimeter: 73 mm  Area: 397 mm2  Optimum Fluoroscopic Angle for Delivery: LAO 12 CAU 11  IMPRESSION: 1. Trileaflet aortic valve with severely calcified and thickened leaflets and mild asymmetric calcifications extending into LVOT under the non-coronary cusp. Annular measurements suitable for delivery of a 23 mm Edwards-SAPIEN 3 Ultra valve. Aortic valve calcium score is 1714 consistent with  severe aortic stenosis.  2. Sufficient coronary to annulus distance.  3. Optimum Fluoroscopic Angle for Delivery:  LAO 12 CAU 11  4. No thrombus in the left atrial appendage.   Electronically Signed By: Leim Moose On: 04/17/2019 18:48  Narrative EXAM: OVER-READ INTERPRETATION  CT CHEST  The following report is an over-read performed by radiologist Dr. Toribio Aye of [facility] on 04/17/2019. This over-read does not include interpretation of cardiac or coronary anatomy or pathology. The coronary calcium score/coronary CTA interpretation by the cardiologist is attached.  COMPARISON:  None.  FINDINGS: Extracardiac findings will be described separately under dictation for contemporaneously obtained CTA chest, abdomen and pelvis.  IMPRESSION: Please see separate dictation for contemporaneously obtained  CTA chest, abdomen and pelvis dated 04/17/2019 for full description of relevant extracardiac findings.  Electronically Signed: By: Toribio Aye M.D. On: 04/17/2019 10:36     ______________________________________________________________________________________________       Current Reported Medications:.    Current Meds  Medication Sig   amLODipine  (NORVASC ) 10 MG tablet TAKE 1 TABLET (10 MG TOTAL) BY MOUTH DAILY.   Ascorbic Acid (VITAMIN C) 1000 MG tablet Take 1,000 mg by mouth daily with breakfast.   aspirin  EC 81 MG tablet Take 1 tablet (81 mg total) by mouth 2 (two) times daily. To prevent blood clots for 30 days after surgery.   Black Pepper-Turmeric (TURMERIC COMPLEX/BLACK PEPPER PO) Take 15 mLs by mouth daily with breakfast.   cetirizine (ZYRTEC) 10 MG tablet Take 10 mg by mouth daily.   cholecalciferol (VITAMIN D ) 1000 UNITS tablet Take 1,000 Units by mouth daily with breakfast.    fluticasone  (FLONASE ) 50 MCG/ACT nasal spray Place 2 sprays into both nostrils at bedtime.    furosemide  (LASIX ) 20 MG tablet MAY TAKE 1 TABLET UP TO THREE TIMES A WEEK. DO NOT TAKE HCTZ THAT DAY.   hydrochlorothiazide  (HYDRODIURIL ) 25 MG tablet Take 1 tablet (25 mg total) by mouth See admin instructions.   lubiprostone  (AMITIZA ) 24 MCG capsule TAKE 1 CAPSULE (24 MCG TOTAL) BY MOUTH 2 (TWO) TIMES DAILY. LUNCH & SUPPER   meclizine  (ANTIVERT ) 25 MG tablet Take 0.5 tablets (12.5 mg total) by mouth 2 (two) times daily as needed for dizziness.   meloxicam  (MOBIC ) 7.5 MG tablet TAKE 1 TABLET (7.5 MG TOTAL) BY MOUTH AT BEDTIME AS NEEDED FOR PAIN. TAKE WITH FOOD.   Multiple Vitamin (MULTI-VITAMIN) tablet Take 1 tablet by mouth daily.   NEXIUM  40 MG capsule Take 40 mg by mouth in the morning.   Polyethyl Glycol-Propyl Glycol 0.4-0.3 % SOLN Place 1-2 drops into both eyes at bedtime.   potassium chloride  (KLOR-CON ) 10 MEQ tablet TAKE 2 TABLETS BY MOUTH 2 TIMES DAILY.   Simethicone 180 MG CAPS Take 1  capsule by mouth as needed.   triamcinolone  cream (KENALOG ) 0.1 % Apply topically 2 (two) times daily. To affected area/ rash (Patient taking differently: Apply 1 Application topically as needed. To affected area/ rash)   venlafaxine  XR (EFFEXOR -XR) 75 MG 24 hr capsule TAKE 1 CAPSULE (75 MG TOTAL) BY MOUTH DAILY AFTER BREAKFAST.   [DISCONTINUED] metoprolol  succinate (TOPROL -XL) 50 MG 24 hr tablet Take 1 tablet (50 mg total) by mouth daily after supper. Take with or immediately following a meal.    Physical Exam:    VS:  BP 113/63 (BP Location: Left Arm, Patient Position: Sitting, Cuff Size: Normal)   Pulse 66   Ht 5' 6 (1.676 m)   Wt 167 lb (75.8 kg)  SpO2 98%   BMI 26.95 kg/m    Wt Readings from Last 3 Encounters:  05/21/24 167 lb (75.8 kg)  02/20/24 167 lb 15.9 oz (76.2 kg)  01/01/24 168 lb (76.2 kg)    GEN: Well nourished, well developed in no acute distress NECK: No JVD; No carotid bruits CARDIAC: RRR, 2/6 SEM, no rubs or gallops RESPIRATORY:  Clear to auscultation without rales, wheezing or rhonchi  ABDOMEN: Soft, non-tender, non-distended EXTREMITIES:  No edema; No acute deformity     Asessement and Plan:.    Severe aortic stenosis: s/p TAVR in October 2020. Last echo on 06/04/2023 indicated LVEF 70 to 75%, aortic valve regurgitation was not visualized, 23 mm SAPIEN prosthetic valve present in the aortic position.  She denies any chest discomfort, increased shortness of breath or lower extremity edema.  She appears euvolemic and well compensated on exam. Reviewed use of prophylactic antibiotics for dental work  Mitral stenosis: Last echocardiogram on 06/04/2023 indicated moderate to severe mitral stenosis.  She denies any chest discomfort, increased redness of breath or lower extremity edema.  She appears euvolemic and well compensated on exam. Echocardiogram ordered for follow-up monitoring scheduled for 06/10/2024.   Hypertension: Blood pressure today 113/63.  Continue  amlodipine  10 mg daily, aspirin  81 mg daily, Lasix  20 mg as needed, hydrochlorothiazide  25 mg daily.  Palpitations: Patient notes episodes of palpitations at night when she first lays down, lasting only a few seconds.  Patient reports that she has not been regularly taking her metoprolol , will take it occasionally during the week, reports that when she takes metoprolol  she feels as though she cannot sleep.  She is open to trialing metoprolol  succinate 12.5 mg in the morning, she will continue to monitor and notify the office if no improvement.  Patient declined cardiac monitor.  Reviewed ED precautions.  Chronic bilateral lower extremity edema: Patient reports that her swelling has been very well-controlled.  She appears euvolemic on exam.   Disposition: F/u with Dr. Anner or Olawale Marney, NP in 6 months or sooner if needed.   Signed, Adream Parzych D Canio Winokur, NP

## 2024-05-21 ENCOUNTER — Encounter: Payer: Self-pay | Admitting: Cardiology

## 2024-05-21 ENCOUNTER — Ambulatory Visit: Admitting: Cardiology

## 2024-05-21 ENCOUNTER — Ambulatory Visit: Attending: Cardiology | Admitting: Cardiology

## 2024-05-21 VITALS — BP 113/63 | HR 66 | Ht 66.0 in | Wt 167.0 lb

## 2024-05-21 DIAGNOSIS — Z952 Presence of prosthetic heart valve: Secondary | ICD-10-CM | POA: Diagnosis not present

## 2024-05-21 DIAGNOSIS — I10 Essential (primary) hypertension: Secondary | ICD-10-CM

## 2024-05-21 DIAGNOSIS — I050 Rheumatic mitral stenosis: Secondary | ICD-10-CM

## 2024-05-21 DIAGNOSIS — R600 Localized edema: Secondary | ICD-10-CM

## 2024-05-21 DIAGNOSIS — R002 Palpitations: Secondary | ICD-10-CM | POA: Diagnosis not present

## 2024-05-21 DIAGNOSIS — I350 Nonrheumatic aortic (valve) stenosis: Secondary | ICD-10-CM

## 2024-05-21 MED ORDER — METOPROLOL SUCCINATE ER 25 MG PO TB24: 12.5000 mg | ORAL_TABLET | Freq: Every morning | ORAL | 2 refills | Status: AC

## 2024-05-21 NOTE — Patient Instructions (Addendum)
 Medication Instructions:  Your physician has recommended you make the following change in your medication:  Decrease Toprol  XL to 12.5 mg daily at breakfast.  Lab Work: None ordered.  You may go to any [facility] Location for your lab work:  [facility] - [address] ([facility]) - [address] [id number] [address]  [place] - [address]   [place]  - [address]   [place] - [address] - [address] [employer]  - [address] ([facility]   If you have labs (blood work) drawn today and your tests are completely normal, you will receive your results only by: [employer] (if you have MyChart)  If you have any lab test that is abnormal or we need to change your treatment, we will call you or send a MyChart message to review the results.  Testing/Procedures: None ordered.  Follow-Up: At [facility], you and your health needs are our priority.  As part of our continuing mission to provide you with exceptional heart care, we have created designated Provider Care Teams.  These Care Teams include your primary Cardiologist (physician) and Advanced Practice Providers (APPs -  Physician Assistants and Nurse Practitioners) who all work together to provide you with the care you need, when you need it.   Your next appointment:   6 months with Dr Anner

## 2024-06-08 ENCOUNTER — Ambulatory Visit

## 2024-06-08 VITALS — BP 113/63 | Ht 66.0 in | Wt 167.0 lb

## 2024-06-08 DIAGNOSIS — Z0000 Encounter for general adult medical examination without abnormal findings: Secondary | ICD-10-CM

## 2024-06-08 NOTE — Patient Instructions (Signed)
 Veronica Greene,  Thank you for taking the time for your Medicare Wellness Visit. I appreciate your continued commitment to your health goals. Please review the care plan we discussed, and feel free to reach out if I can assist you further.  Please note that Annual Wellness Visits do not include a physical exam. Some assessments may be limited, especially if the visit was conducted virtually. If needed, we may recommend an in-person follow-up with your provider.  Ongoing Care Seeing your primary care provider every 3 to 6 months helps us  monitor your health and provide consistent, personalized care.   Referrals If a referral was made during today's visit and you haven't received any updates within two weeks, please contact the referred provider directly to check on the status.  Recommended Screenings:  Health Maintenance  Topic Date Due   Zoster (Shingles) Vaccine (1 of 2) 02/23/1965   Medicare Annual Wellness Visit  12/18/2023   Flu Shot  02/07/2024   COVID-19 Vaccine (4 - 2025-26 season) 03/09/2024   Breast Cancer Screening  07/16/2024   DTaP/Tdap/Td vaccine (5 - Td or Tdap) 12/25/2032   Pneumococcal Vaccine for age over 77  Completed   Osteoporosis screening with Bone Density Scan  Completed   Hepatitis C Screening  Completed   Meningitis B Vaccine  Aged Out   Colon Cancer Screening  Discontinued       06/08/2024    2:09 PM  Advanced Directives  Does Patient Have a Medical Advance Directive? Yes  Type of Estate Agent of [place];Living will  Does patient want to make changes to medical advance directive? No - Patient declined  Copy of Healthcare Power of Attorney in Chart? No - copy requested    Vision: Annual vision screenings are recommended for early detection of glaucoma, cataracts, and diabetic retinopathy. These exams can also reveal signs of chronic conditions such as diabetes and high blood pressure.  Dental: Annual dental screenings help detect  early signs of oral cancer, gum disease, and other conditions linked to overall health, including heart disease and diabetes.  Please see the attached documents for additional preventive care recommendations.

## 2024-06-08 NOTE — Progress Notes (Signed)
 I connected with  Veronica Greene on 06/08/24 by a audio enabled telemedicine application and verified that I am speaking with the correct person using two identifiers.  Patient Location: Home  Provider Location: Home Office  Persons Participating in Visit: Patient.  I discussed the limitations of evaluation and management by telemedicine. The patient expressed understanding and agreed to proceed.  Vital Signs: Because this visit was a virtual/telehealth visit, some criteria may be missing or patient reported. Any vitals not documented were not able to be obtained and vitals that have been documented are patient reported.   Because this visit was a virtual/telehealth visit,  certain criteria was not obtained, such a blood pressure, CBG if applicable, and timed get up and go. Any medications not marked as taking were not mentioned during the medication reconciliation part of the visit. Any vitals not documented were not able to be obtained due to this being a telehealth visit or patient was unable to self-report a recent blood pressure reading due to a lack of equipment at home via telehealth. Vitals that have been documented are verbally provided by the patient.  This visit was performed by a medical professional under my direct supervision. I was immediately available for consultation/collaboration. I have reviewed and agree with the Annual Wellness Visit documentation.  Chief Complaint  Patient presents with   Medicare Wellness     Subjective:   Veronica Greene is a 78 y.o. female who presents for a [employer] Visit.  Visit info / Clinical Intake: Medicare Wellness Visit Type:: Subsequent Annual Wellness Visit Persons participating in visit and providing information:: patient Medicare Wellness Visit Mode:: Telephone If telephone:: video declined Since this visit was completed virtually, some vitals may be partially provided or unavailable. Missing vitals are  due to the limitations of the virtual format.: Documented vitals are patient reported If Telephone or Video please confirm:: I connected with patient using audio/video enable telemedicine. I verified patient identity with two identifiers, discussed telehealth limitations, and patient agreed to proceed. Patient Location:: home Provider Location:: home office Interpreter Needed?: No Pre-visit prep was completed: yes AWV questionnaire completed by patient prior to visit?: no Living arrangements:: lives with spouse/significant other Patient's Overall Health Status Rating: very good Typical amount of pain: some Does pain affect daily life?: no Are you currently prescribed opioids?: no  Dietary Habits and Nutritional Risks How many meals a day?: 3 Eats fruit and vegetables daily?: yes Most meals are obtained by: preparing own meals In the last 2 weeks, have you had any of the following?: none Diabetic:: no  Functional Status Activities of Daily Living (to include ambulation/medication): Independent Ambulation: Independent with device- listed below Home Assistive Devices/Equipment: Cane Medication Administration: Independent Home Management: Independent Manage your own finances?: yes Primary transportation is: driving Concerns about vision?: no *vision screening is required for WTM* Concerns about hearing?: (!) yes Uses hearing aids?: (!) yes Hear whispered voice?: yes  Fall Screening Falls in the past year?: 0 Number of falls in past year: 0 Was there an injury with Fall?: 0 Fall Risk Category Calculator: 0 Patient Fall Risk Level: Low Fall Risk  Fall Risk Patient at Risk for Falls Due to: Impaired balance/gait; Impaired mobility Fall risk Follow up: Falls evaluation completed  Home and Transportation Safety: All rugs have non-skid backing?: yes All stairs or steps have railings?: yes Grab bars in the bathtub or shower?: yes Have non-skid surface in bathtub or shower?:  yes Good home lighting?: yes Regular seat  belt use?: yes Hospital stays in the last year:: no  Cognitive Assessment Difficulty concentrating, remembering, or making decisions? : no Will 6CIT or Mini Cog be Completed: no 6CIT or Mini Cog Declined: patient alert, oriented, able to answer questions appropriately and recall recent events  Advance Directives (For Healthcare) Does Patient Have a Medical Advance Directive?: Yes Does patient want to make changes to medical advance directive?: No - Patient declined Type of Advance Directive: Healthcare Power of [place]; Living will Copy of Healthcare Power of Attorney in Chart?: No - copy requested Copy of Living Will in Chart?: No - copy requested  Reviewed/Updated  Reviewed/Updated: Reviewed All (Medical, Surgical, Family, Medications, Allergies, Care Teams, Patient Goals)    Allergies (verified) Clarithromycin, Wound dressing adhesive, Penicillins, Other, and Codeine   Current Medications (verified) Outpatient Encounter Medications as of 06/08/2024  Medication Sig   amLODipine  (NORVASC ) 10 MG tablet TAKE 1 TABLET (10 MG TOTAL) BY MOUTH DAILY.   Ascorbic Acid (VITAMIN C) 1000 MG tablet Take 1,000 mg by mouth daily with breakfast.   aspirin  EC 81 MG tablet Take 1 tablet (81 mg total) by mouth 2 (two) times daily. To prevent blood clots for 30 days after surgery.   Black Pepper-Turmeric (TURMERIC COMPLEX/BLACK PEPPER PO) Take 15 mLs by mouth daily with breakfast.   cetirizine (ZYRTEC) 10 MG tablet Take 10 mg by mouth daily.   cholecalciferol (VITAMIN D ) 1000 UNITS tablet Take 1,000 Units by mouth daily with breakfast.    fluticasone  (FLONASE ) 50 MCG/ACT nasal spray Place 2 sprays into both nostrils at bedtime.    furosemide  (LASIX ) 20 MG tablet MAY TAKE 1 TABLET UP TO THREE TIMES A WEEK. DO NOT TAKE HCTZ THAT DAY.   hydrochlorothiazide  (HYDRODIURIL ) 25 MG tablet Take 1 tablet (25 mg total) by mouth See admin instructions.   lubiprostone   (AMITIZA ) 24 MCG capsule TAKE 1 CAPSULE (24 MCG TOTAL) BY MOUTH 2 (TWO) TIMES DAILY. LUNCH & SUPPER   meclizine  (ANTIVERT ) 25 MG tablet Take 0.5 tablets (12.5 mg total) by mouth 2 (two) times daily as needed for dizziness.   meloxicam  (MOBIC ) 7.5 MG tablet TAKE 1 TABLET (7.5 MG TOTAL) BY MOUTH AT BEDTIME AS NEEDED FOR PAIN. TAKE WITH FOOD.   metoprolol  succinate (TOPROL -XL) 25 MG 24 hr tablet Take 0.5 tablets (12.5 mg total) by mouth in the morning. Take with or immediately following a meal.   Multiple Vitamin (MULTI-VITAMIN) tablet Take 1 tablet by mouth daily.   NEXIUM  40 MG capsule Take 40 mg by mouth in the morning.   Polyethyl Glycol-Propyl Glycol 0.4-0.3 % SOLN Place 1-2 drops into both eyes at bedtime.   potassium chloride  (KLOR-CON ) 10 MEQ tablet TAKE 2 TABLETS BY MOUTH 2 TIMES DAILY.   Simethicone 180 MG CAPS Take 1 capsule by mouth as needed.   triamcinolone  cream (KENALOG ) 0.1 % Apply topically 2 (two) times daily. To affected area/ rash (Patient taking differently: Apply 1 Application topically as needed. To affected area/ rash)   venlafaxine  XR (EFFEXOR -XR) 75 MG 24 hr capsule TAKE 1 CAPSULE (75 MG TOTAL) BY MOUTH DAILY AFTER BREAKFAST.   No facility-administered encounter medications on file as of 06/08/2024.    History: Past Medical History:  Diagnosis Date   Anxiety and depression 08/26/2007   Qualifier: Diagnosis of  By: Tower MD, Laine Caldron    Arthritis    knees   Constipation, slow transit 11/03/2010   Degenerative disk disease 08/31/2011   Depression with anxiety    DERMATITIS, SEBORRHEIC  08/25/2010   Qualifier: Diagnosis of  By: Randeen MD, Laine Caldron    Endometrial cancer [facility])    02/ 2019  Diagnosed with D&C/hysteroscopy with polypectomy   Essential hypertension 08/29/2017   Fibromyalgia    tx mobic    Full dentures    GERD 08/26/2007   Qualifier: Diagnosis of  By: Randeen MD, Laine Caldron    Gout    Hearing loss    wears bilateral hearing aids   History of colon  polyps    History of nonmelanoma skin cancer    right lower leg   History of poliomyelitis 08/26/2007   Qualifier: History of  By: Randeen MD, Laine Caldron    History of radiation therapy    Endometium- 11/20/17-12/11/17- Vag Cuff -Dr. Lynwood Nasuti   Hyperlipidemia, mild 09/02/2012   Hypertension    Hypokalemia 01/02/2011   IBS (irritable bowel syndrome)    with constipation   Obesity 08/26/2007   Qualifier: Diagnosis of  By: Randeen MD, Laine Caldron    OSTEOARTHRITIS, HANDS, BILATERAL 08/26/2007   Qualifier: Diagnosis of  By: Randeen MD, Laine Caldron    Osteopenia    OVERACTIVE BLADDER 08/26/2007   Qualifier: Diagnosis of  By: Randeen MD, Laine Caldron    Personal history of colonic polyps 11/03/2010   05/2018 13 polyps mostly adenomas, ssp's - recall 1 year 2020 Lupita CHARLENA Commander, MD, [facility]    Poor balance 11/25/2018   With falls  Has rise in R shoe/post polio  Weak legs as well     S/P TAVR (transcatheter aortic valve replacement)    s/p TAVR with a 23mm Edwards S3U via the TF approach by Dr. Lucas and Dr. Verlin   Severe calcific aortic stenosis 08/27/2017    -> Became severe as of October 2020-referred for TAVR on 04/28/2019   URINARY INCONTINENCE 08/26/2007   Qualifier: Diagnosis of  By: Randeen MD, Laine Caldron    Wears hearing aid in both ears    Past Surgical History:  Procedure Laterality Date   ANKLE SURGERY Right 1956   CARPAL TUNNEL RELEASE Bilateral 1986, 1993   CATARACT EXTRACTION Right 11/16/2019   COLONOSCOPY N/A 06/24/2015   Procedure: COLONOSCOPY;  Surgeon: Lupita FORBES Commander, MD;  Location: [facility] END[facility];  Service: Endoscopy;  Laterality: N/A;   DILATATION & CURETTAGE/HYSTEROSCOPY WITH MYOSURE N/A 08/09/2017   Procedure: DILATATION & CURETTAGE/HYSTEROSCOPY WITH MYOSURE;  Surgeon: Rosalva Sawyer, MD;  Location: [facility] ORS;  Service: Gynecology;  Laterality: N/A;  Polypectomy   DILATION AND CURETTAGE OF UTERUS     PMB   HAND SURGERY Bilateral 1999   Thumbs    KNEE SURGERY Bilateral 1998   x2    LYMPH NODE BIOPSY N/A 10/08/2017   Procedure: LYMPH NODE BIOPSY;  Surgeon: Eloy Herring, MD;  Location: [facility] ORS;  Service: Gynecology;  Laterality: N/A;   MULTIPLE TOOTH EXTRACTIONS     NASAL SINUS SURGERY  1976   polio surgery.     right foot - right leg 1.5 shorter than left leg - some wekness   RIGHT/LEFT HEART CATH AND CORONARY ANGIOGRAPHY N/A 04/09/2019   Procedure: RIGHT/LEFT HEART CATH AND CORONARY ANGIOGRAPHY;  Surgeon: Anner Alm ORN, MD;  Location: [facility] INVASIVE CV LAB;  Service: Cardiovascular;; Severe AS, mean gradient 50 mmHg P-P 55 mmHg (estimated AVA 0.76 cm).  Mildly elevated pulmonary pressures with elevated LVEDP and PCWP.  Angiographically normal coronary arteries, but tortuous.   ROBOTIC ASSISTED TOTAL HYSTERECTOMY WITH BILATERAL SALPINGO OOPHERECTOMY Bilateral 10/08/2017   Procedure: XI  ROBOTIC ASSISTED TOTAL HYSTERECTOMY WITH BILATERAL SALPINGO OOPHORECTOMY;  Surgeon: Eloy Herring, MD;  Location: [facility];  Service: Gynecology;  Laterality: Bilateral;   TEE WITHOUT CARDIOVERSION N/A 04/28/2019   Procedure: TRANSESOPHAGEAL ECHOCARDIOGRAM (TEE);  Surgeon: Verlin Lonni BIRCH, MD;  Location: [facility] INVASIVE CV LAB;  Service: Open Heart Surgery;  Laterality: N/A;   THUMB ARTHROSCOPY  1999   TONSILLECTOMY  4513   78 years old   TOTAL KNEE ARTHROPLASTY Right 05/22/2022   Procedure: TOTAL KNEE ARTHROPLASTY;  Surgeon: Beverley Evalene BIRCH, MD;  Location: [facility] OR[facility];  Service: Orthopedics;  Laterality: Right;   TRANSCATHETER AORTIC VALVE REPLACEMENT, TRANSFEMORAL N/A 04/28/2019   Procedure: TRANSCATHETER AORTIC VALVE REPLACEMENT, TRANSFEMORAL;  Surgeon: Verlin Lonni BIRCH, MD;  Location: [facility] INVASIVE CV LAB; ; 23 mm Sapien 3 Ultra THV via TF approach. (post-op mean AV gradient 13 mmHg)   TRANSTHORACIC ECHOCARDIOGRAM  05/15/2021   Normal/stable LV size and function.  EF 65 to 70%.  No RWMA.  GR 2 DD.  Moderate LA dilation.  Normal RV with mild RA dilation, normal RAP.SABRA  Severe MAC with no  MR.  23 mm SAPIEN prosthetic TAVR valve well-seated.  Mean AVG 18 mm..  Trivial AI.  (No significant change from 05/04/2020-mean AVG 14 mmHg).   TRANSTHORACIC ECHOCARDIOGRAM  02/17/2019   Mod LVH. GR 2 DD. Severe AS (mean gradient = 44 mmHg with Mod AI. mild MS.   TRANSTHORACIC ECHOCARDIOGRAM  04/29/2019; 05/2020   a)  (post TAVR): EF 60-65%.  GRII DD.  Mod LA dilation & nl RA.  Mod MAC.  No  AI (no-PVL), mean AV gradient 13 mmHg.;; b) (2nd post TAVR) EF 60 to 65%.  Severely increased thickness/LVH.  GRII DD with only mildly elevated left atrial pressure.  Normal RA size.  Severe MAC with only trace MR.  No MS.  TAVR valve prosthesis functioning well.  Trivial AI.  Mean gradient 15 mmHg with a peak 29.8 mmHg   TUBAL LIGATION     UPPER GI ENDOSCOPY     normal   WISDOM TOOTH EXTRACTION     Family History  Problem Relation Age of Onset   Kidney disease Mother    Alzheimer's disease Mother    Stroke Father 94   Colon cancer Father 74       possibly colon cancer, possibly just polyps   Kidney disease Sister    Heart attack Sister    Colon cancer Paternal Aunt 44   Cancer Paternal Uncle        unk type   Esophageal cancer Neg Hx    Stomach cancer Neg Hx    Rectal cancer Neg Hx    Breast cancer Neg Hx    Ovarian cancer Neg Hx    Endometrial cancer Neg Hx    Pancreatic cancer Neg Hx    Prostate cancer Neg Hx    Colon polyps Neg Hx    Social History   Occupational History   Occupation: RECEPTION    Employer: [employer]    Comment: Retired; theme park manager reception  Tobacco Use   Smoking status: Never    Passive exposure: Past   Smokeless tobacco: Never  Vaping Use   Vaping status: Never Used  Substance and Sexual Activity   Alcohol  use: No    Alcohol /week: 0.0 standard drinks of alcohol    Drug use: No   Sexual activity: Yes    Birth control/protection: Post-menopausal   Tobacco Counseling Counseling given: Not Answered  SDOH Screenings  Food Insecurity: No Food  Insecurity (06/08/2024)  Housing: Low Risk  (06/08/2024)  Transportation Needs: No Transportation Needs (06/08/2024)  Utilities: Not At Risk (06/08/2024)  Alcohol  Screen: Low Risk  (12/18/2022)  Depression (PHQ2-9): Low Risk  (06/08/2024)  Financial Resource Strain: Low Risk  (12/18/2022)  Physical Activity: Inactive (06/08/2024)  Social Connections: Moderately Integrated (06/08/2024)  Stress: No Stress Concern Present (06/08/2024)  Tobacco Use: Low Risk  (06/08/2024)  Health Literacy: Adequate Health Literacy (06/08/2024)   See flowsheets for full screening details  Depression Screen PHQ 2 & 9 Depression Scale- Over the past 2 weeks, how often have you been bothered by any of the following problems? Little interest or pleasure in doing things: 2 Feeling down, depressed, or hopeless (PHQ Adolescent also includes...irritable): 0 PHQ-2 Total Score: 2 Trouble falling or staying asleep, or sleeping too much: 1 Feeling tired or having little energy: 0 Poor appetite or overeating (PHQ Adolescent also includes...weight loss): 0 Feeling bad about yourself - or that you are a failure or have let yourself or your family down: 0 Trouble concentrating on things, such as reading the newspaper or watching television (PHQ Adolescent also includes...like school work): 0 Moving or speaking so slowly that other people could have noticed. Or the opposite - being so fidgety or restless that you have been moving around a lot more than usual: 0 Thoughts that you would be better off dead, or of hurting yourself in some way: 0 PHQ-9 Total Score: 3 If you checked off any problems, how difficult have these problems made it for you to do your work, take care of things at home, or get along with other people?: Not difficult at all  Depression Treatment Depression Interventions/Treatment : EYV7-0 Score <4 Follow-up Not Indicated     Goals Addressed             This Visit's Progress    Patient Stated   On track     No new goals.             Objective:    Today's Vitals   06/08/24 1402  BP: 113/63  Weight: 167 lb (75.8 kg)  Height: 5' 6 (1.676 m)   Body mass index is 26.95 kg/m.  Hearing/Vision screen Hearing Screening - Comments:: Wears hearing aids  Vision Screening - Comments:: No vision issues  Immunizations and Health Maintenance Health Maintenance  Topic Date Due   Zoster Vaccines- Shingrix (1 of 2) 02/23/1965   Medicare Annual Wellness (AWV)  12/18/2023   Influenza Vaccine  02/07/2024   COVID-19 Vaccine (4 - 2025-26 season) 03/09/2024   Mammogram  07/16/2024   DTaP/Tdap/Td (5 - Td or Tdap) 12/25/2032   Pneumococcal Vaccine: 50+ Years  Completed   Bone Density Scan  Completed   Hepatitis C Screening  Completed   Meningococcal B Vaccine  Aged Out   Colonoscopy  Discontinued        Assessment/Plan:  This is a routine wellness examination for [place].  Patient Care Team: Tower, Laine LABOR, MD as PCP - General Anner Alm ORN, MD as PCP - Cardiology (Cardiology) Regal, [place] RAMAN, DPM as Consulting Physician (Podiatry) Beverley Toribio FALCON, MD (Inactive) as Consulting Physician (Orthopedic Surgery) Mable Alm, MD (Inactive) as Consulting Physician (Otolaryngology) Glendia Brew, DDS as Consulting Physician (Dentistry) Glendia Simmonds, OD as Referring Physician (Optometry)  I have personally reviewed and noted the following in the patient's chart:   Medical and social history Use of alcohol , tobacco or illicit drugs  Current medications  and supplements including opioid prescriptions. Functional ability and status Nutritional status Physical activity Advanced directives List of other physicians Hospitalizations, surgeries, and ER visits in previous 12 months Vitals Screenings to include cognitive, depression, and falls Referrals and appointments  No orders of the defined types were placed in this encounter.  In addition, I have reviewed and discussed with patient certain  preventive protocols, quality metrics, and best practice recommendations. A written personalized care plan for preventive services as well as general preventive health recommendations were provided to patient.   Lyle MARLA Right, [place]   06/08/2024   No follow-ups on file.  After Visit Summary: (MyChart) Due to this being a telephonic visit, the after visit summary with patients personalized plan was offered to patient via MyChart   Nurse Notes: nothing to report

## 2024-06-10 ENCOUNTER — Ambulatory Visit (HOSPITAL_COMMUNITY)
Admission: RE | Admit: 2024-06-10 | Discharge: 2024-06-10 | Disposition: A | Source: Ambulatory Visit | Attending: Cardiology | Admitting: Cardiology

## 2024-06-10 DIAGNOSIS — R600 Localized edema: Secondary | ICD-10-CM | POA: Diagnosis not present

## 2024-06-10 DIAGNOSIS — Z952 Presence of prosthetic heart valve: Secondary | ICD-10-CM | POA: Insufficient documentation

## 2024-06-10 DIAGNOSIS — R002 Palpitations: Secondary | ICD-10-CM | POA: Insufficient documentation

## 2024-06-10 DIAGNOSIS — I050 Rheumatic mitral stenosis: Secondary | ICD-10-CM | POA: Insufficient documentation

## 2024-06-10 LAB — ECHOCARDIOGRAM COMPLETE
AR max vel: 1.73 cm2
AV Area VTI: 1.92 cm2
AV Area mean vel: 1.9 cm2
AV Mean grad: 11.5 mmHg
AV Peak grad: 23.5 mmHg
Ao pk vel: 2.43 m/s
Area-P 1/2: 1.81 cm2
MV VTI: 1.45 cm2
S' Lateral: 2.8 cm

## 2024-06-11 ENCOUNTER — Ambulatory Visit: Payer: Self-pay | Admitting: Cardiology

## 2024-07-08 ENCOUNTER — Other Ambulatory Visit: Payer: Self-pay | Admitting: Cardiology

## 2024-08-04 ENCOUNTER — Ambulatory Visit: Payer: Self-pay

## 2024-08-04 NOTE — Telephone Encounter (Signed)
 Pt/daughte-n-law notified of Dr. Graham comments.   They did want me to ask Dr. Randeen what is a good OTC med to help with her cough she can take given her meds/ chronic issues. They know Mucinex is good but if there is any cough meds that they can get OTC if needed.

## 2024-08-04 NOTE — Telephone Encounter (Signed)
 FYI Only or Action Required?: Action required by provider: clinical question for provider and update on patient condition.  Patient was last seen in primary care on 12/17/2023 by Randeen Laine LABOR, MD.  Called Nurse Triage reporting Influenza.  Symptoms began a week ago.  Interventions attempted: OTC medications: Tylenol  .  Symptoms are: gradually improving.  Triage Disposition: Call PCP Within 24 Hours  Patient/caregiver understands and will follow disposition?: Yes                  Message from [facility] P sent at 08/04/2024 10:06 AM EST  Reason for Triage: has had cough since saturday. concered with medicaiton interacting and only taking tylenol . Is there a medciaiton that is recommended to her. and is currently at the hospital.   Reason for Disposition  Patient is HIGH RISK (e.g., 65 years and older, pregnant, HIV+, or chronic medical condition)  Answer Assessment - Initial Assessment Questions 1. SYMPTOMS: What is your main symptom or concern? (e.g., cough, fever, shortness of breath, muscle aches)     Cough, runny nose.  2. ONSET: When did the symptoms start?      X 1 week.  3. COUGH: Do you have a cough? If Yes, ask: How bad is the cough?       Yes. Terrible cough, have to hold my chest (under breasts) when coughing. Dry cough  4. FEVER: Do you have a fever? If Yes, ask: What is your temperature, how was it measured, and when did it start?     Has not checked temperature.  5. BREATHING DIFFICULTY: Are you having any difficulty breathing? (e.g., normal; shortness of breath, wheezing, unable to speak)      No.  6. BETTER-SAME-WORSE: Are you getting better, staying the same or getting worse compared to yesterday?  If getting worse, ask, In what way?     Better.  7. OTHER SYMPTOMS: Do you have any other symptoms?  (e.g., chills, fatigue, headache, loss of smell or taste, muscle pain, sore throat)     Mild fatigue. No difficulty breathing,  headache, earache, body ache, loss of taste or smell.  8. INFLUENZA EXPOSURE: Was there any known exposure to influenza (flu) before the symptoms began?      Yes, husband is hospitalized with Flu A right now.  9. INFLUENZA SUSPECTED: Why do you think you have influenza? (e.g., positive flu self-test at home, symptoms after exposure).     Symptoms and exposure.  10. INFLUENZA VACCINE: Have you had the flu vaccine? If Yes, ask: When did you last get it?       Yes.  11. HIGH RISK FOR COMPLICATIONS: Do you have any chronic medical problems? (e.g., asthma, heart or lung disease, obesity, weak immune system)       No.  12. PREGNANCY: Is there any chance you are pregnant? When was your last menstrual period?       N/A.  13. O2 SATURATION MONITOR:  Do you use an oxygen saturation monitor (pulse oximeter) at home? If Yes, ask What is your reading (oxygen level) today? What is your usual oxygen saturation reading? (e.g., 95%)       No.  Protocols used: Influenza (Flu) Suspected-A-AH

## 2024-08-04 NOTE — Telephone Encounter (Signed)
 Tamiflu does not work unless given in the first 48 h of symptoms  Hers started a week ago   Aware unable to come in   (I am also unable to get in the office today)  Agree with ER/UC precautions   Treat symptoms best you can  Follow up when able if needed

## 2024-08-04 NOTE — Telephone Encounter (Signed)
 Will route to PCP and a provider who is in the office

## 2024-08-04 NOTE — Telephone Encounter (Signed)
 Mucinex DM (the DM is the cough suppressant) is ok to try with caution since your blood pressure is well controlled  It requires lots of fluids to work well Watch out for sedation  Sometimes an antihistamine will help with cough also - zyrtec 10 mg is already on your list

## 2024-08-05 NOTE — Telephone Encounter (Signed)
 Spoke with pt relaying Dr Graham message. Pt verbalizes understanding and wrote down name of meds. Pt expresses her thanks.

## 2024-12-10 ENCOUNTER — Other Ambulatory Visit

## 2024-12-17 ENCOUNTER — Encounter: Admitting: Family Medicine
